# Patient Record
Sex: Male | Born: 1935
Health system: Southern US, Community
[De-identification: ages and names within clinical notes are randomized; demographics above are authoritative.]

## PROBLEM LIST (undated history)

## (undated) DIAGNOSIS — C61 Malignant neoplasm of prostate: Secondary | ICD-10-CM

## (undated) DIAGNOSIS — H5440 Blindness, one eye, unspecified eye: Secondary | ICD-10-CM

## (undated) DIAGNOSIS — Z992 Dependence on renal dialysis: Secondary | ICD-10-CM

## (undated) DIAGNOSIS — N186 End stage renal disease: Secondary | ICD-10-CM

## (undated) DIAGNOSIS — K429 Umbilical hernia without obstruction or gangrene: Secondary | ICD-10-CM

## (undated) DIAGNOSIS — I739 Peripheral vascular disease, unspecified: Secondary | ICD-10-CM

## (undated) DIAGNOSIS — M1990 Unspecified osteoarthritis, unspecified site: Secondary | ICD-10-CM

## (undated) DIAGNOSIS — I509 Heart failure, unspecified: Secondary | ICD-10-CM

## (undated) DIAGNOSIS — Z950 Presence of cardiac pacemaker: Secondary | ICD-10-CM

## (undated) DIAGNOSIS — E785 Hyperlipidemia, unspecified: Secondary | ICD-10-CM

## (undated) DIAGNOSIS — E119 Type 2 diabetes mellitus without complications: Secondary | ICD-10-CM

## (undated) DIAGNOSIS — B029 Zoster without complications: Secondary | ICD-10-CM

## (undated) DIAGNOSIS — E663 Overweight: Secondary | ICD-10-CM

## (undated) DIAGNOSIS — I10 Essential (primary) hypertension: Secondary | ICD-10-CM

## (undated) DIAGNOSIS — I7092 Chronic total occlusion of artery of the extremities: Secondary | ICD-10-CM

## (undated) DIAGNOSIS — I639 Cerebral infarction, unspecified: Secondary | ICD-10-CM

## (undated) DIAGNOSIS — D649 Anemia, unspecified: Secondary | ICD-10-CM

## (undated) HISTORY — DX: Chronic total occlusion of artery of the extremities: I70.92

## (undated) HISTORY — DX: Peripheral vascular disease, unspecified: I73.9

## (undated) HISTORY — PX: EYE SURGERY: SHX253

## (undated) HISTORY — DX: Cerebral infarction, unspecified: I63.9

## (undated) HISTORY — DX: Hyperlipidemia, unspecified: E78.5

## (undated) HISTORY — PX: CATARACT EXTRACTION: SUR2

## (undated) HISTORY — PX: INSERTION PROSTATE RADIATION SEED: SUR718

## (undated) HISTORY — PX: HERNIA REPAIR: SHX51

## (undated) HISTORY — DX: Unspecified osteoarthritis, unspecified site: M19.90

## (undated) HISTORY — DX: Heart failure, unspecified: I50.9

## (undated) HISTORY — DX: Essential (primary) hypertension: I10

## (undated) HISTORY — DX: Overweight: E66.3

---

## 1997-06-28 ENCOUNTER — Other Ambulatory Visit: Admission: RE | Admit: 1997-06-28 | Discharge: 1997-06-28 | Payer: Self-pay | Admitting: *Deleted

## 1997-09-28 ENCOUNTER — Emergency Department (HOSPITAL_COMMUNITY): Admission: EM | Admit: 1997-09-28 | Discharge: 1997-09-28 | Payer: Self-pay | Admitting: Emergency Medicine

## 1998-08-28 ENCOUNTER — Ambulatory Visit (HOSPITAL_COMMUNITY): Admission: RE | Admit: 1998-08-28 | Discharge: 1998-08-28 | Payer: Self-pay | Admitting: Gastroenterology

## 1999-07-09 ENCOUNTER — Encounter: Admission: RE | Admit: 1999-07-09 | Discharge: 1999-07-09 | Payer: Self-pay | Admitting: *Deleted

## 1999-07-09 ENCOUNTER — Encounter: Payer: Self-pay | Admitting: *Deleted

## 2000-08-19 ENCOUNTER — Encounter (INDEPENDENT_AMBULATORY_CARE_PROVIDER_SITE_OTHER): Payer: Self-pay | Admitting: Specialist

## 2000-08-19 ENCOUNTER — Other Ambulatory Visit: Admission: RE | Admit: 2000-08-19 | Discharge: 2000-08-19 | Payer: Self-pay | Admitting: Urology

## 2000-09-30 ENCOUNTER — Encounter (INDEPENDENT_AMBULATORY_CARE_PROVIDER_SITE_OTHER): Payer: Self-pay | Admitting: Specialist

## 2000-09-30 ENCOUNTER — Other Ambulatory Visit: Admission: RE | Admit: 2000-09-30 | Discharge: 2000-09-30 | Payer: Self-pay | Admitting: Urology

## 2000-11-12 ENCOUNTER — Ambulatory Visit: Admission: RE | Admit: 2000-11-12 | Discharge: 2001-02-10 | Payer: Self-pay | Admitting: Radiation Oncology

## 2001-02-02 ENCOUNTER — Encounter: Admission: RE | Admit: 2001-02-02 | Discharge: 2001-02-02 | Payer: Self-pay | Admitting: Radiation Oncology

## 2001-02-04 ENCOUNTER — Ambulatory Visit (HOSPITAL_BASED_OUTPATIENT_CLINIC_OR_DEPARTMENT_OTHER): Admission: RE | Admit: 2001-02-04 | Discharge: 2001-02-04 | Payer: Self-pay | Admitting: Urology

## 2001-02-25 ENCOUNTER — Ambulatory Visit: Admission: RE | Admit: 2001-02-25 | Discharge: 2001-05-26 | Payer: Self-pay | Admitting: Radiation Oncology

## 2001-08-01 ENCOUNTER — Encounter: Admission: RE | Admit: 2001-08-01 | Discharge: 2001-10-30 | Payer: Self-pay | Admitting: Internal Medicine

## 2007-01-08 ENCOUNTER — Emergency Department (HOSPITAL_COMMUNITY): Admission: EM | Admit: 2007-01-08 | Discharge: 2007-01-08 | Payer: Self-pay | Admitting: Emergency Medicine

## 2010-05-23 ENCOUNTER — Ambulatory Visit (INDEPENDENT_AMBULATORY_CARE_PROVIDER_SITE_OTHER): Payer: Medicare Other | Admitting: Vascular Surgery

## 2010-05-23 ENCOUNTER — Encounter (INDEPENDENT_AMBULATORY_CARE_PROVIDER_SITE_OTHER): Payer: Medicare Other

## 2010-05-23 DIAGNOSIS — N186 End stage renal disease: Secondary | ICD-10-CM

## 2010-05-23 DIAGNOSIS — Z01810 Encounter for preprocedural cardiovascular examination: Secondary | ICD-10-CM

## 2010-05-23 DIAGNOSIS — N189 Chronic kidney disease, unspecified: Secondary | ICD-10-CM

## 2010-05-23 DIAGNOSIS — N19 Unspecified kidney failure: Secondary | ICD-10-CM

## 2010-05-26 NOTE — Consult Note (Signed)
NEW PATIENT CONSULTATION  Ralls, Naziah DOB:  [birth year]                                       05/23/2010 [id number]  REASON FOR CONSULTATION:  Placement of access.  HISTORY OF PRESENT ILLNESS:  This is a 77 year old gentleman has chronic kidney disease stage IV.  His nephrologist feels he is approaching end- stage renal disease, so he wants evaluated for a new access.  This gentleman's dominant arm is a right arm.  He denies any previous accesses and any previous central line placement.  Denies any sensory or motor deficits.  PAST MEDICAL HISTORY:  Diabetes, hypertension, hyperlipidemia, prostate cancer, stroke, gout, overweight, chronic kidney disease stage III-IV.  PAST SURGICAL HISTORY:  Past surgical history includes some type of left eye surgery.  He has had some prostate seeding placement.  SOCIAL HISTORY:  He had denies any tobacco, alcohol or illicit drug use.  FAMILY HISTORY:  His father had some type of throat cancer and died at 82.  Mother had breast cancer and a MI and died at 83.  MEDICATIONS:  Included indomethacin, glimepiride, Lipitor, metoprolol, amlodipine, Cozaar, colchicine, aspirin, Lasix, calcitriol, allopurinol, Tradjenta and also another drug Mapap.  ALLERGIES:  No known drug allergies.  REVIEW OF SYSTEMS:  He had pain in legs with walking, pain in the feet when lying flat, stroke, arthritis, joint pain, kidney disease, frequent urination.  PHYSICAL EXAMINATION:  He had a blood pressure of 150/84, respirations of 68, respirations were 12. General examination:  He is alert, oriented x3, obese. Head exam:  Normocephalic, atraumatic. ENT exam:  Hearing grossly intact.  Nares without any erythema or drainage.  Oropharynx without erythema or exudate. On eye exam pupils were equal, round, reactive to light.  Extraocular movements were intact. On neck exam supple neck with no nuchal rigidity, no palpable lymphadenopathy. On  pulmonary exam symmetric, good movement.  No rales, rhonchi or wheezing. Cardiac exam:  Regular rate and rhythm.  Normal S1, S2. Vascular exam:  He had palpable upper extremity pulses.  Carotids were palpable with no bruit.  Aorta was not palpable.  The bilateral femorals were palpable but his popliteal and pedal pulses were not palpable. On GI exam soft abdomen, nontender, nondistended, no guarding or rebound, no hepatosplenomegaly.  No obvious masses. Musculoskeletal exam:  He had 5/5 strength throughout.  He had bilateral edema in the lower extremities without any ulceration or ischemic changes. On neuro exam cranial nerves II-XII were intact.  Motor was as listed above.  Sensation grossly intact in all extremities. Psych exam:  Judgment was intact.  Mood and affect were appropriate for his clinical situation. Skin exam:  Extremities were as listed above.  No obvious rashes otherwise noted. On lymphatic exam, no cervical, axillary, or inguinal lymphadenopathy.  Noninvasive vascular imaging:  He had vein mapping.  This demonstrated bilateral cephalic and basilic veins that were all compatible with fistulas.  MEDICAL DECISION MAKING:  This is a 77 year old right-hand dominant gentleman who has chronic kidney disease stage IV.  Based on his vein mapping, he has good options in both arms.  Proceed first with attempt at a radiocephalic versus brachiocephalic in the left arm.  I would reserve the left basilic vein transposition if these two should fail. He is aware that the risks of this procedure include bleeding, infection, possible steal syndrome, possible nerve damage, possible ischemic monomelar  neuropathy, possible failure to mature and possible need for additional procedures.  We discussed the nature of access surgery over a 15 minute period and he is aware of all these risks. Tentatively we are scheduling him for Monday April 2.    Conrad [place], MD Electronically  Signed  BLC/MEDQ  D:  05/23/2010  T:  05/26/2010  Job:  [id number]

## 2010-05-27 ENCOUNTER — Other Ambulatory Visit: Payer: Self-pay | Admitting: Vascular Surgery

## 2010-05-27 ENCOUNTER — Encounter (HOSPITAL_COMMUNITY)
Admission: RE | Admit: 2010-05-27 | Discharge: 2010-05-27 | Disposition: A | Discharge status: Home / Self Care | Payer: Medicare Other | Source: Ambulatory Visit | Attending: Vascular Surgery | Admitting: Vascular Surgery

## 2010-05-27 DIAGNOSIS — N186 End stage renal disease: Secondary | ICD-10-CM

## 2010-05-27 DIAGNOSIS — Z01818 Encounter for other preprocedural examination: Secondary | ICD-10-CM | POA: Insufficient documentation

## 2010-05-27 DIAGNOSIS — Z01812 Encounter for preprocedural laboratory examination: Secondary | ICD-10-CM | POA: Insufficient documentation

## 2010-05-27 LAB — CBC
HCT: 36.6 % — ABNORMAL LOW (ref 39.0–52.0)
Hemoglobin: 12.1 g/dL — ABNORMAL LOW (ref 13.0–17.0)
MCH: 29 pg (ref 26.0–34.0)
MCHC: 33.1 g/dL (ref 30.0–36.0)
MCV: 87.8 fL (ref 78.0–100.0)
Platelets: 248 10*3/uL (ref 150–400)
RBC: 4.17 MIL/uL — ABNORMAL LOW (ref 4.22–5.81)
RDW: 14 % (ref 11.5–15.5)
WBC: 8.2 10*3/uL (ref 4.0–10.5)

## 2010-05-27 LAB — BASIC METABOLIC PANEL
BUN: 64 mg/dL — ABNORMAL HIGH (ref 6–23)
CO2: 27 mEq/L (ref 19–32)
Calcium: 9.6 mg/dL (ref 8.4–10.5)
Chloride: 106 mEq/L (ref 96–112)
Creatinine, Ser: 4.23 mg/dL — ABNORMAL HIGH (ref 0.4–1.5)
GFR calc Af Amer: 17 mL/min — ABNORMAL LOW (ref >60)
GFR calc non Af Amer: 14 mL/min — ABNORMAL LOW (ref >60)
Glucose, Bld: 131 mg/dL — ABNORMAL HIGH (ref 70–99)
Potassium: 5.5 mEq/L — ABNORMAL HIGH (ref 3.5–5.1)
Sodium: 139 mEq/L (ref 135–145)

## 2010-05-27 NOTE — Procedures (Unsigned)
CEPHALIC VEIN MAPPING  INDICATION:  Chronic kidney disease.  HISTORY: Diabetes, hypertension and hyperlipidemia.  EXAM: The right cephalic vein is compressible with diameter measurements ranging from 0.32 to 0.57 cm.  The right basilic vein is compressible with diameter measurements ranging from 0.33 to 0.46 cm.  The left cephalic vein is compressible with diameter measurements ranging from 0.36 to 0.65 cm.  The left basilic vein is compressible with diameter measurements ranging from 0.26 to 0.61 cm.  See attached worksheet for all measurements.  IMPRESSION: 1. Patent bilateral cephalic and basilic veins with diameter     measurements, as described above. 2. Incidental finding noted of dual brachial artery systems in the     bilateral upper extremities. 3. The basilic veins bifurcate at the level of the distal upper     arm/antecubital fossa. 4. The left cephalic vein at the wrist is on the posterior aspect of     the arm.  ___________________________________________ Conrad [place], MD  SH/MEDQ  D:  05/23/2010  T:  05/23/2010  Job:  [id number]

## 2010-06-02 ENCOUNTER — Ambulatory Visit (HOSPITAL_COMMUNITY)
Admission: RE | Admit: 2010-06-02 | Discharge: 2010-06-02 | Disposition: A | Discharge status: Home / Self Care | Payer: Medicare Other | Source: Ambulatory Visit | Attending: Vascular Surgery | Admitting: Vascular Surgery

## 2010-06-02 DIAGNOSIS — M109 Gout, unspecified: Secondary | ICD-10-CM | POA: Insufficient documentation

## 2010-06-02 DIAGNOSIS — N185 Chronic kidney disease, stage 5: Secondary | ICD-10-CM | POA: Insufficient documentation

## 2010-06-02 DIAGNOSIS — E119 Type 2 diabetes mellitus without complications: Secondary | ICD-10-CM | POA: Insufficient documentation

## 2010-06-02 DIAGNOSIS — I120 Hypertensive chronic kidney disease with stage 5 chronic kidney disease or end stage renal disease: Secondary | ICD-10-CM

## 2010-06-02 DIAGNOSIS — Z8546 Personal history of malignant neoplasm of prostate: Secondary | ICD-10-CM | POA: Insufficient documentation

## 2010-06-02 DIAGNOSIS — N186 End stage renal disease: Secondary | ICD-10-CM

## 2010-06-02 DIAGNOSIS — Z01818 Encounter for other preprocedural examination: Secondary | ICD-10-CM | POA: Insufficient documentation

## 2010-06-02 HISTORY — PX: AV FISTULA PLACEMENT, RADIOCEPHALIC: SHX1208

## 2010-06-02 LAB — GLUCOSE, CAPILLARY: Glucose-Capillary: 87 mg/dL (ref 70–99)

## 2010-06-04 LAB — POCT I-STAT 4, (NA,K, GLUC, HGB,HCT)
Glucose, Bld: 99 mg/dL (ref 70–99)
HCT: 37 % — ABNORMAL LOW (ref 39.0–52.0)
Hemoglobin: 12.6 g/dL — ABNORMAL LOW (ref 13.0–17.0)
Potassium: 4.6 mEq/L (ref 3.5–5.1)

## 2010-06-06 NOTE — Op Note (Signed)
NAMEOLTON, PREJEAN               ACCOUNT NO.:  [id number]  MEDICAL RECORD NO.:  [id number]           PATIENT TYPE:  O  LOCATION:  [facility]                         FACILITY:  [facility]  PHYSICIAN:  Conrad Rawlins, MD       DATE OF BIRTH:  02-28-1934  DATE OF PROCEDURE: DATE OF DISCHARGE:  06/02/2010                              OPERATIVE REPORT   PROCEDURE:  A left radiocephalic arteriovenous fistula.  PREOPERATIVE DIAGNOSIS:  Chronic kidney disease, stage V.  POSTOPERATIVE DIAGNOSIS:  Chronic kidney disease, stage V.  SURGEON:  Aaron Edelman L. Bridgett Larsson, MD  ANESTHESIA:  General.  FINDINGS:  In this case is a weak thrill and a dopplerable left radial at the end of the case and an atherosclerotic left radial artery.  SPECIMENS:  None.  ESTIMATED BLOOD LOSS:  Minimal.  INDICATIONS:  This is a 77 year old gentleman who is now in chronic kidney disease stage V.  Based on his vein mapping, he was a good candidate for either a left radiocephalic or brachiocephalic arteriovenous fistula.  He is aware of the risks of the procedure include bleeding, infection, possible steal, possible ischemic monomeric neuropathy, possible nerve damage, possible need for additional procedures, and possible failure to mature.  He was aware of these risks and agreed to proceed forward.  DESCRIPTION OF THE OPERATION:  After full informed written consent was obtained from the patient, he was brought back to the operating room and placed supine upon the operating table.  Prior to inducing anesthesia, he had received IV antibiotics.  After obtaining adequate anesthesia, he was then prepped and draped in standard fashion for left arm access procedure.  I turned my attention first of his wrist.  Using a SonoSite, I successfully identified the cephalic vein branch adjacent to his radial artery.  I made a transverse incision over both the vein and artery.  Using blunt dissection and electrocautery, I develop a plane down  to the vein and artery.  The vein was noted to be externally at least 3 mm in diameter.  Also, the artery was noted to be about 3 mm in diameter.  I dissected out the vein a little distally and proximally and clamped the vein distally, and transected the vein, and then controlled the distal vein with two titanium clips.  I then interrogated the proximal vein after dissecting out a little bit more.  There was actually good venous backbleeding.  I was able to pass easily a 3-mm dilator.  I passed also a 3.5-mm diameter, but it caught at one location and, however, was able to be dilated past this one area of stenosis.  I felt that there was still adequate for attempt at a fistula and then dissected out the radial artery proximally and distally to obtain some additional distance and placed vessel loops around it.  It was placed under tension proximally and distally to these vessel loops.  I made an arteriotomy in it, and extended with Potts scissor for about a 3.5-mm arteriotomy.  I distended the vein again, injected heparinized saline, and then spatulated the vein for this 3.5-mm arteriotomy.  The vein  was sewn to the artery in an end-to-side configuration using a running stitch of 7-0 Prolene.  Prior to completing this anastomosis, I allowed the artery to back bleed from both ends.  There was good back bleeding without clot.  The vein also backbled.  I completed the anastomosis in usual fashion.  There was a little bit of raw surface bleeding in the surgical wound, so I put thrombin and Gelfoam in this wound.  Immediately, there was a good pulse in the outflow vein and a weak thrill.  I interrogated the artery and vein with a continuous Doppler.  It demonstrated a dopplerable left radial artery which did not augment significantly with compression.  There was a strong venous outflow signal and then a multiphasic signal on the proximal end.  I then irrigated out this wound and took out all the  thrombin and Gelfoam.  There was no more active bleeding.  The subcutaneous tissue was reapproximated with running stitch of 3-0 Vicryl.  The skin was then closed with running subcuticular 4-0 Monocryl and reinforced with Dermabond.  The patient was allowed to awaken without difficulties with plan to discharge home.  COMPLICATIONS:  None.  CONDITION:  Stable.     Conrad [place], MD     BLC/MEDQ  D:  06/02/2010  T:  06/03/2010  Job:  [id number]  Electronically Signed by Adele Barthel MD on 06/06/2010 05:25:09 PM

## 2010-06-12 ENCOUNTER — Encounter: Payer: Self-pay | Admitting: Vascular Surgery

## 2010-07-04 ENCOUNTER — Ambulatory Visit (INDEPENDENT_AMBULATORY_CARE_PROVIDER_SITE_OTHER): Payer: Medicare Other | Admitting: Vascular Surgery

## 2010-07-04 DIAGNOSIS — N186 End stage renal disease: Secondary | ICD-10-CM

## 2010-07-07 ENCOUNTER — Encounter: Payer: Self-pay | Admitting: Vascular Surgery

## 2010-07-07 NOTE — Assessment & Plan Note (Signed)
OFFICE VISIT  Sean Best, Sean Best DOB:  20-Jul-1933                                       07/04/2010 [id number]  Postop followup.  HISTORY OF PRESENT ILLNESS:  A 77 year old gentleman status post a left radiocephalic arteriovenous fistula placed on June 02, 2010 presents for a followup.  He has had no steal symptomatology and is able to complete his activities of daily living.  No drainage from his incision, which is healed at this point.  PHYSICAL EXAMINATION:  Blood pressure is 152/87, respirations were 24, heart rate of 69.  On focused examination, the left wrist demonstrates a well-healed incision and a strongly palpable thrill at this level, which at about mid arm, drops off.  I can also see dilation at the skin level of this radiocephalic arteriovenous fistula.  MEDICAL DECISION MAKING:  A 77 year old gentleman with a left radiocephalic arteriovenous fistula.  I suspect there are some competing side branches that are siphoning off flow.  I want to get him duplexed in his left arm and then evaluate for possible large competing side branches.  The duplex will also give me an accurate evaluation of depth of the fistula along with size measurements on this radiocephalic arteriovenous fistula.  I should get an idea if this is ready for utilization versus the possible need for superficialization versus possible need for ligation of competing side branches.    Conrad [place], MD Electronically Signed  BLC/MEDQ  D:  07/04/2010  T:  07/07/2010  Job:  [id number]  cc:   Dr. Hassell Done

## 2010-07-18 NOTE — Op Note (Signed)
[facility]  Patient:    Sean, Best Visit Number: [id number] MRN: [id number]          Service Type: NES Location: [place] Attending Physician:  Philipp Deputy Dictated by:   Thana Farr Karsten Ro, M.D. Proc. Date: 02/04/01 Admit Date:  02/04/2001 Discharge Date: 02/04/2001   CC:         Truddie Crumble, M.D.   Operative Report  PREOPERATIVE DIAGNOSIS: Adenocarcinoma of the prostate.  POSTOPERATIVE DIAGNOSIS: Adenocarcinoma of the prostate.  OPERATION: I-125 seed implantation.  SURGEON: Mark C. Karsten Ro, M.D.  RADIATION ONCOLOGIST: Truddie Crumble, M.D.  DRAINS: A 16 French Foley catheter.  ESTIMATED BLOOD LOSS: Less than 5 cc.  NUMBER OF SEEDS: 100.  NUMBER OF NEEDLES: 25.  COMPLICATIONS: None.  INDICATIONS: The patient is a 77 year old black male with biopsy-proven adenocarcinoma of the prostate. I have discussed the treatment options with him and this is outlined in my office notes, which have also been placed on the hospital chart. He has elected to proceed with radioactive seed implantation and understands the risks, complications, alternatives, limitations.  DESCRIPTION OF PROCEDURE: After informed consent, the patient was brought to the major OR, placed on the table and administered general endotracheal anesthesia and then moved to the dorsal lithotomy position, modified to place the peritoneum perpendicular with the floor. A rectal tube was inserted as well as Foley catheter with dilute contrast in the balloon. Real time fluoroscopy was then used to position the patient and transrectal ultrasound was placed within the rectum and secured to the table. Real time ultrasound was then used to place the patient in identical position to that used for simulation and stabilizing needles were then inserted. I then placed the seeds under direct real time ultrasound and fluoroscopic control without complication. One strand of three  seeds was dislodged from the needle and it was located in the deep tissues, not within the prostate and should cause no problem.  I then removed the ultrasound probe stabilizing needles and Foley catheter as well as rectal tube and performed flexible cystoscopy.  The urethra was noted to be entirely normal down to the sphincter which appears intact and prostatic urethra reveals some bilobar hypertrophy and slight elongation, but no lesions nor were there any evidence of Vicryl strands or seeds seen within the prostatic urethra. The bladder itself was fully inspected and noted to be free of any tumor, stones, or inflammatory lesions. There was 1+ trabeculation in and the ureteral orifices were of normal configuration and position. No active bleeding was noted. The bladder neck and prostate base region were then visualized with retroflexion of the scope.  No seeds were noted protruding from the prostate gland into the bladder and there were no seeds within the bladder itself.  The patient tolerated the procedure well and there were no inoperative complications, and he will therefore be discharged home with a Foley catheter indwelling with instructions on its removal tomorrow.  He will follow up in my office in 3 weeks. He will be given a prescription for Cipro 500 mg b.i.d. which he will take for 5 days and a prescription for Vicodin ES. Dictated by:   Thana Farr Karsten Ro, M.D. Attending Physician:  Philipp Deputy DD:  02/04/01 TD:  02/05/01 Job: [id number]

## 2010-08-01 ENCOUNTER — Encounter: Payer: Self-pay | Admitting: Vascular Surgery

## 2010-08-01 ENCOUNTER — Encounter (INDEPENDENT_AMBULATORY_CARE_PROVIDER_SITE_OTHER): Payer: Medicare Other

## 2010-08-01 ENCOUNTER — Ambulatory Visit (INDEPENDENT_AMBULATORY_CARE_PROVIDER_SITE_OTHER): Payer: Medicare Other | Admitting: Vascular Surgery

## 2010-08-01 VITALS — BP 164/89 | HR 66 | Temp 98.1°F

## 2010-08-01 DIAGNOSIS — N184 Chronic kidney disease, stage 4 (severe): Secondary | ICD-10-CM

## 2010-08-01 DIAGNOSIS — N185 Chronic kidney disease, stage 5: Secondary | ICD-10-CM | POA: Insufficient documentation

## 2010-08-01 DIAGNOSIS — T82898A Other specified complication of vascular prosthetic devices, implants and grafts, initial encounter: Secondary | ICD-10-CM

## 2010-08-01 NOTE — Progress Notes (Signed)
[facility] [place]  Postoperative Visit  History of Present Illness  Sean Best is a 77 y.o. year old male who presents for postoperative follow-up for: L RC AVF (Date: 06/02/10).  The patient's wound are healed.  The patient notes no steal symptoms.  The patient is able to complete his activities of daily living.    Physical Examination  Filed Vitals:   08/01/10 1051  BP: 164/89  Pulse: 66  Temp: 98.1 F (36.7 C)   LU extremity: Incision is healed, skin feels warm, hand grip is 5/5, sensation in digits is intact, easily palpable thrill, strong bruit in forearm  Non-invasive Vascular Imaging L access duplex: L forearm cephalic demonstrates multiple sidebranches and cephalic vein ranges [id number] cm in diameter   Medical Decision Making  Sean Best is a 77 y.o. year old male who presents s/p L RC AVF (2 months).  The Cimino AVF is also matured (0.57-0.54 cm).  As the patient is not on hemodialysis yet I think we can allow it another month to mature.  I will have patient follow in 4 weeks and if his RC AVF still in not > 6 mm throughout at that point I will proceed with ligation of competing side branches.  Thank you for allowing [place] to participate in this patient's care.  Adele Barthel, MD [facility] Pager: [phone]

## 2010-08-01 NOTE — Progress Notes (Signed)
Post op Right Radial-cephalic AVF [id number]

## 2010-08-13 NOTE — Procedures (Unsigned)
VASCULAR LAB EXAM  INDICATION:  End-stage renal disease, status post left radiocephalic AV fistula, ?side branches.   HISTORY:  EXAM:  Left AV fistula duplex.  IMPRESSION: 1. Patent radiocephalic to cephalic arteriovenous fistula with     velocities of greater than 450 cm/s noted in the distal radial     artery at the anastomosis and in the distal forearm level outflow     vein. 2. The left radial artery demonstrates a retrograde flow. 3. Diameter, depth, velocity, and patent outflow vein branch     measurements are noted on the attached worksheet.   ___________________________________________ Conrad [place], MD  CH/MEDQ  D:  08/01/2010  T:  08/01/2010  Job:  [id number]

## 2010-08-27 NOTE — Progress Notes (Signed)
[facility] [place]  Established Dialysis Access  History of Present Illness  Sean Best is a 77 y.o. male who presents for re-evaluation of his L RC AVF.  The patient presents with follow up access duplex of L RC AVF.  Past Medical History, Past Surgical History, Social History, Family History, Medications, Allergies, and Review of Systems are unchanged from previous visit 08/01/10.  Physical Examination  Filed Vitals:   08/29/10 1629  BP: 146/83  Pulse: 70  Resp: 18    General: A&O x 3, WDWN,   Pulmonary: symmetric expansion, good air movement, no rales, rhonchi or wheezing  Cardiac: RRR, Nl S1, S2, no Murmurs, rubs or gallops  Musculoskeletal: BUE: M/S 5/5 throughout, Extremities without  ischemic changes   Neurologic: Pain and light touch intact in BUE extremities, Motor exam as listed above  Non-Invasive Vascular Imaging  L access duplex  (Date: 08/01/10):   2.9-5.7 mm diameter  5 Multiple side branches discovered  High velocities in radial artery: 460-583 c/s c/w atherosclerotic disease  Medical Decision Making  Sean Best is a 77 y.o. male who presents with L RC AVF with marginal maturation.  Based on vein mapping and examination, this patient needs side branch ligation.  The patient would like to wait another month to see if the fistula is able to mature.  I think this may be an acceptable alternative as it already is almost at adequate size.  Fundamentally this radial artery is of poor quality due to atherosclerosis so I would not be surprised if this RC AVF clots.  Subsequently, ancillary procedures are not necessarily of value in this pt.  However, this pressured cephalic vein may facilitate a successful L BC AVF by distending the upper arm cephalic vein.  Adele Barthel, MD [facility] [place] Office: [phone] Pager: [phone]

## 2010-08-29 ENCOUNTER — Ambulatory Visit (INDEPENDENT_AMBULATORY_CARE_PROVIDER_SITE_OTHER): Payer: Medicare Other | Admitting: Vascular Surgery

## 2010-08-29 ENCOUNTER — Encounter: Payer: Self-pay | Admitting: Vascular Surgery

## 2010-08-29 VITALS — BP 146/83 | HR 70 | Resp 18

## 2010-08-29 DIAGNOSIS — N184 Chronic kidney disease, stage 4 (severe): Secondary | ICD-10-CM

## 2010-09-26 ENCOUNTER — Ambulatory Visit: Payer: Medicare Other

## 2010-09-26 ENCOUNTER — Ambulatory Visit: Payer: Medicare Other | Admitting: Vascular Surgery

## 2010-10-29 NOTE — Progress Notes (Signed)
[facility] [place]  Established Dialysis Access  History of Present Illness  Baron Steinback is a 77 y.o. male who presents for re-evaluation for permanent access.  The patient is right hand dominant.  Previous access procedures have been completed in the left arm.  The patient's complication from previous access procedures include: thrombosis.  Past Medical History, Past Surgical History, Social History, Family History, Medications, Allergies, and Review of Systems are unchanged from previous visit 08/01/10.  Physical Examination  Filed Vitals:   10/31/10 1536  BP: 176/92  Pulse: 70  Resp: 20    General: A&O x 3, WDWN  Vascular:   palpable left radial,  palpable thrill,  thrill ausc, L RC AVF > 6 mm throughout  Musculoskeletal: M/S 5/5 throughout , Extremities without  ischemic changes   Neurologic:  Pain and light touch intact in extremities , Motor exam as listed above  Medical Decision Making  Jadriel Ebron is a 77 y.o. male who presents with successfully matured L RC AVF   The patient's RC AVF can be used as needed  If flow rates are inadequate, side branch ligation may be attempted to improve the possible flow rate in the conduit  Thank you for letting us participate in this patient's care  Adele Barthel, MD [facility] [place] Office: [phone] Pager: [phone]

## 2010-10-30 ENCOUNTER — Encounter: Payer: Self-pay | Admitting: Vascular Surgery

## 2010-10-31 ENCOUNTER — Ambulatory Visit (INDEPENDENT_AMBULATORY_CARE_PROVIDER_SITE_OTHER): Payer: Medicare Other | Admitting: Vascular Surgery

## 2010-10-31 ENCOUNTER — Encounter: Payer: Self-pay | Admitting: Vascular Surgery

## 2010-10-31 VITALS — BP 176/92 | HR 70 | Resp 20 | Ht 65.0 in | Wt 244.0 lb

## 2010-10-31 DIAGNOSIS — N186 End stage renal disease: Secondary | ICD-10-CM

## 2010-12-09 LAB — DIFFERENTIAL
Lymphocytes Relative: 15
Lymphs Abs: 1.7
Neutro Abs: 8.5 — ABNORMAL HIGH
Neutrophils Relative %: 73

## 2010-12-09 LAB — I-STAT 8, (EC8 V) (CONVERTED LAB)
Acid-Base Excess: 3 — ABNORMAL HIGH
Chloride: 103
HCT: 50
Operator id: 126491
Potassium: 4.5
Sodium: 136
TCO2: 30

## 2010-12-09 LAB — CBC
HCT: 45.3
Platelets: 314
WBC: 11.6 — ABNORMAL HIGH

## 2010-12-09 LAB — URIC ACID: Uric Acid, Serum: 8.5 — ABNORMAL HIGH

## 2010-12-09 LAB — POCT I-STAT CREATININE: Operator id: 126491

## 2011-02-20 ENCOUNTER — Ambulatory Visit: Payer: Medicare Other | Admitting: Vascular Surgery

## 2011-03-19 ENCOUNTER — Encounter: Payer: Self-pay | Admitting: Vascular Surgery

## 2011-03-20 ENCOUNTER — Encounter: Payer: Self-pay | Admitting: Vascular Surgery

## 2011-03-20 ENCOUNTER — Ambulatory Visit (INDEPENDENT_AMBULATORY_CARE_PROVIDER_SITE_OTHER): Payer: Medicare Other | Admitting: Vascular Surgery

## 2011-03-20 VITALS — BP 134/79 | HR 66 | Resp 20 | Ht 66.0 in | Wt 240.0 lb

## 2011-03-20 DIAGNOSIS — N184 Chronic kidney disease, stage 4 (severe): Secondary | ICD-10-CM

## 2011-03-20 DIAGNOSIS — Z992 Dependence on renal dialysis: Secondary | ICD-10-CM | POA: Insufficient documentation

## 2011-03-20 NOTE — Progress Notes (Signed)
[facility] [place]  Established Dialysis Access  History of Present Illness  Sean Best is a 78 y.o. male who presents for re-evaluation of his L RC AVF (06/02/10).  The patient is right hand dominant.  Previous access procedures have been completed in the left arm.  The patient's complication from previous access procedures include: possible non-maturation.  The patient has never had a previous PPM placed.  The patient remains off hemodialysis.  Past Medical History, Past Surgical History, Social History, Family History, Medications, Allergies, and Review of Systems are unchanged from previous visit on 08/01/10.  Physical Examination  Filed Vitals:   03/20/11 1451  BP: 134/79  Pulse: 66  Resp: 20  Height: 5\' 6"  (1.676 m)  Weight: 240 lb (108.863 kg)   Body mass index is 38.74 kg/(m^2).  General: A&O x 3, WDWN, stocky  Pulmonary: Sym exp, good air movt, CTAB, no rales, rhonchi, & wheezing  Cardiac: RRR, Nl S1, S2, no Murmurs, rubs or gallops  Gastrointestinal: soft, NTND, -G/R, - HSM, - masses, - CVAT B  Musculoskeletal: M/S 5/5 throughout , Extremities without  ischemic changes , palpable thrill and ausc thrill in left forearm, visible distension in cephalic vein, difficult to feel thrill in proximal forearm  Neurologic: CN 2-12 intact , Pain and light touch intact in extremities , Motor exam as listed above  Medical Decision Making  Sean Best is a 78 y.o. male who presents with CKD IV  I would start with a repeat L arm access duplex to check if the fistula has matured further.  If not, I would consider L arm fistulogram to help determine the next necessary intervention.  We will obtain the studies in next 2-4 weeks and patient will follow up after that is available.Adele Barthel, MD [facility] [place] Office: [phone] Pager: [phone]  03/20/2011, 6:35 PM

## 2011-04-16 ENCOUNTER — Encounter: Payer: Self-pay | Admitting: Vascular Surgery

## 2011-04-17 ENCOUNTER — Ambulatory Visit (INDEPENDENT_AMBULATORY_CARE_PROVIDER_SITE_OTHER): Payer: Medicare Other | Admitting: Vascular Surgery

## 2011-04-17 ENCOUNTER — Encounter: Payer: Self-pay | Admitting: Vascular Surgery

## 2011-04-17 ENCOUNTER — Encounter (INDEPENDENT_AMBULATORY_CARE_PROVIDER_SITE_OTHER): Payer: Medicare Other | Admitting: *Deleted

## 2011-04-17 VITALS — BP 155/84 | HR 73 | Resp 16 | Ht 65.0 in | Wt 245.0 lb

## 2011-04-17 DIAGNOSIS — N186 End stage renal disease: Secondary | ICD-10-CM

## 2011-04-17 DIAGNOSIS — N184 Chronic kidney disease, stage 4 (severe): Secondary | ICD-10-CM

## 2011-04-17 NOTE — Progress Notes (Signed)
[facility] [place]  Established Dialysis Access  History of Present Illness  Sean Best is a 78 y.o. (1933/10/10) male who presents for re-evaluation of his permanent access.  He remains off HD with some improvement in renal function by report.  The patient denies any problems from his L RC AVF.  Past Medical History, Past Surgical History, Social History, Family History, Medications, Allergies, and Review of Systems are unchanged from previous visit on 03/20/11.  Physical Examination  Filed Vitals:   04/17/11 1602  BP: 155/84  Pulse: 73  Resp: 16  Height: 5\' 5"  (1.651 m)  Weight: 245 lb (111.131 kg)  SpO2: 98%   Body mass index is 40.77 kg/(m^2).  General: A&O x 3, WDWN  Pulmonary: Sym exp, good air movt, CTAB, no rales, rhonchi, & wheezing  Cardiac: RRR, Nl S1, S2, no Murmurs, rubs or gallops  Gastrointestinal: soft, NTND, -G/R, - HSM, - masses, - CVAT B  Musculoskeletal: M/S 5/5 throughout , Extremities without  ischemic changes , muscular L forearm with palpable thrill and bruit  Neurologic: Pain and light touch intact in extremities , Motor exam as listed above  Non-Invasive Vascular Imaging  L access duplex  (Date: 04/17/11):   Widely patent L RC AVF with excellent flow rates  L RC AVF: 5.2-8.2 mm in diameter, w/ depth < 6 mm   Medical Decision Making  Sean Best is a 78 y.o. male who presents with CKD not requiring hemodialysis.   The pt's RC AVF is more visible than when I last saw him.  In my opinion, it is big enough for use already.    The issue is he has a relatively muscular forearm which makes it a bit difficult to palpate the vein proximally in the forearm.  Ironically, it is already 6-8 mm at the segment most difficult to palpate.    Also the distal segment routes onto his volar surface of his forearm, not expectedly.  There are several side branches that could be considered for ligation if difficulty with cannulation were  to occur.  Also if difficulty with cannulation exists in the forearm, the flow rates may be adequate in the upper arm cephalic as he seems to preferentially drain through that system, as it is progressively enlarging.    This patient will follow up as needed.  Adele Barthel, MD [facility] [place] Office: [phone] Pager: [phone]  04/17/2011, 5:27 PM

## 2011-04-28 NOTE — Procedures (Unsigned)
VASCULAR LAB EXAM  INDICATION:  Chronic kidney disease stage IV.  HISTORY: Diabetes: Cardiac: Hypertension:  EXAM:  Left AV fistula duplex.  IMPRESSION: 1. Patent left radial to cephalic arteriovenous fistula noted with     maximum velocity of 250 cm/s noted at the anastomosis level with no     internal narrowing visualized. 2. The left radial artery demonstrates retrograde flow which is     unchanged from the previous exam on 08/01/2010. 3. Depth, diameter, velocity and patent outflow vein branch     measurements are noted on the attached worksheet. 4. Doppler velocities of the inflow artery, anastomosis and outflow     vein appear less than previously recorded during the exam on     08/01/2010.  ___________________________________________ Conrad Rosemont, MD  CH/MEDQ  D:  04/17/2011  T:  04/17/2011  Job:  [id number]

## 2012-02-09 ENCOUNTER — Other Ambulatory Visit: Payer: Self-pay | Admitting: *Deleted

## 2012-02-09 DIAGNOSIS — Z01810 Encounter for preprocedural cardiovascular examination: Secondary | ICD-10-CM

## 2012-02-09 DIAGNOSIS — N186 End stage renal disease: Secondary | ICD-10-CM

## 2012-03-11 ENCOUNTER — Ambulatory Visit: Payer: Medicare Other | Admitting: Vascular Surgery

## 2012-06-03 ENCOUNTER — Encounter (HOSPITAL_COMMUNITY): Payer: Medicare Other

## 2012-06-16 ENCOUNTER — Other Ambulatory Visit (HOSPITAL_COMMUNITY): Payer: Self-pay | Admitting: *Deleted

## 2012-06-17 ENCOUNTER — Encounter (HOSPITAL_COMMUNITY): Payer: Medicare Other

## 2012-10-17 ENCOUNTER — Encounter: Payer: Self-pay | Admitting: Nephrology

## 2012-11-15 ENCOUNTER — Encounter: Payer: Self-pay | Admitting: Nephrology

## 2013-02-08 ENCOUNTER — Other Ambulatory Visit (HOSPITAL_COMMUNITY): Payer: Self-pay | Admitting: *Deleted

## 2013-02-09 ENCOUNTER — Encounter (HOSPITAL_COMMUNITY)
Admission: RE | Admit: 2013-02-09 | Discharge: 2013-02-09 | Disposition: A | Discharge status: Home / Self Care | Payer: Medicare Other | Source: Ambulatory Visit | Attending: Nephrology | Admitting: Nephrology

## 2013-02-09 DIAGNOSIS — N185 Chronic kidney disease, stage 5: Secondary | ICD-10-CM | POA: Insufficient documentation

## 2013-02-09 DIAGNOSIS — D638 Anemia in other chronic diseases classified elsewhere: Secondary | ICD-10-CM | POA: Insufficient documentation

## 2013-02-09 LAB — POCT HEMOGLOBIN-HEMACUE: Hemoglobin: 9.1 g/dL — ABNORMAL LOW (ref 13.0–17.0)

## 2013-02-09 MED ORDER — CLONIDINE HCL 0.1 MG PO TABS
ORAL_TABLET | ORAL | Status: AC
  Filled 2013-02-09: qty 1

## 2013-02-09 MED ORDER — CLONIDINE HCL 0.1 MG PO TABS
0.1000 mg | ORAL_TABLET | Freq: Once | ORAL | Status: AC
  Administered 2013-02-09: 0.1 mg via ORAL

## 2013-02-09 MED ORDER — SODIUM CHLORIDE 0.9 % IV SOLN
1020.0000 mg | Freq: Once | INTRAVENOUS | Status: AC
  Administered 2013-02-09: 1020 mg via INTRAVENOUS
  Filled 2013-02-09: qty 34

## 2013-02-09 MED ORDER — EPOETIN ALFA 10000 UNIT/ML IJ SOLN: 10000.0000 [IU] | INTRAMUSCULAR | Status: DC

## 2013-02-09 MED ORDER — EPOETIN ALFA 10000 UNIT/ML IJ SOLN
INTRAMUSCULAR | Status: AC
  Filled 2013-02-09: qty 1

## 2013-02-16 ENCOUNTER — Encounter (HOSPITAL_COMMUNITY)
Admission: RE | Admit: 2013-02-16 | Discharge: 2013-02-16 | Disposition: A | Discharge status: Home / Self Care | Payer: Medicare Other | Source: Ambulatory Visit | Attending: Nephrology | Admitting: Nephrology

## 2013-02-17 ENCOUNTER — Encounter (INDEPENDENT_AMBULATORY_CARE_PROVIDER_SITE_OTHER): Payer: Self-pay | Admitting: General Surgery

## 2013-02-17 ENCOUNTER — Telehealth (INDEPENDENT_AMBULATORY_CARE_PROVIDER_SITE_OTHER): Payer: Self-pay

## 2013-02-17 ENCOUNTER — Ambulatory Visit (INDEPENDENT_AMBULATORY_CARE_PROVIDER_SITE_OTHER): Payer: Medicare Other | Admitting: General Surgery

## 2013-02-17 VITALS — BP 158/90 | HR 71 | Temp 97.5°F | Resp 16 | Ht 64.5 in | Wt 222.8 lb

## 2013-02-17 DIAGNOSIS — N184 Chronic kidney disease, stage 4 (severe): Secondary | ICD-10-CM

## 2013-02-17 DIAGNOSIS — K429 Umbilical hernia without obstruction or gangrene: Secondary | ICD-10-CM

## 2013-02-17 NOTE — Progress Notes (Signed)
Subjective:   end-stage renal disease and umbilical hernia  Patient ID: Sean Best, male   DOB: 1933-10-02, 79 y.o.   MRN: [id number]  HPI Patient is a 79 year old male with stage IV chronic kidney disease secondary to diabetes and hypertension who is followed by Dr. Joelyn Oms. He has a functioning fistula in place but due to care issues of his wife at home who has dementia he has a desire for peritoneal dialysis. He has been to be education classes at the kidney center and felt to be a candidate for peritoneal dialysis. He also has an umbilical hernia. It is not symptomatic. He's not had any previous abdominal surgery. He comes into the office today with his daughter who is a Marine scientist and can help of a former patient of mine. He denies any abdominal or GI complaints.  Past Medical History  Diagnosis Date  . Stroke   . Claudication   . Chronic total occlusion of artery of the extremities   . Diabetes mellitus   . Hypertension   . Hyperlipidemia   . Cancer     prostate cancer  . Gout   . Overweight(278.02)   . Arthritis    Past Surgical History  Procedure Laterality Date  . Eye surgery    . Prostate surgery      prostate seed placement  . Av fistula placement, radiocephalic  [id number]    Left arm   Current Outpatient Prescriptions  Medication Sig Dispense Refill  . ACCU-CHEK COMPACT STRIPS test strip       . acetaminophen (TYLENOL) 500 MG tablet Take 500 mg by mouth every 6 (six) hours as needed.        Marland Kitchen allopurinol (ZYLOPRIM) 100 MG tablet Take 100 mg by mouth daily.        Marland Kitchen amLODipine (NORVASC) 10 MG tablet Take 10 mg by mouth daily.        Marland Kitchen aspirin 81 MG tablet Take 81 mg by mouth daily.        Marland Kitchen atorvastatin (LIPITOR) 40 MG tablet Take 40 mg by mouth daily.        . calcitRIOL (ROCALTROL) 0.25 MCG capsule Take 0.25 mcg by mouth daily.        . calcitRIOL (ROCALTROL) 0.5 MCG capsule       . clopidogrel (PLAVIX) 75 MG tablet Take 75 mg by mouth daily.        . colchicine 0.6  MG tablet Take 0.6 mg by mouth daily.        Marland Kitchen doxazosin (CARDURA) 2 MG tablet       . furosemide (LASIX) 80 MG tablet Take 80 mg by mouth 4 (four) times daily as needed.        Marland Kitchen glimepiride (AMARYL) 4 MG tablet Take 4 mg by mouth 2 (two) times daily.        . indomethacin (INDOCIN) 50 MG capsule Take 50 mg by mouth 2 (two) times daily with a meal.        . Linagliptin 5 MG TABS Take by mouth.        . losartan (COZAAR) 100 MG tablet Take 100 mg by mouth daily.        . metoprolol (LOPRESSOR) 100 MG tablet 100 mg 2 (two) times daily.       . metoprolol (LOPRESSOR) 50 MG tablet Take 50 mg by mouth 2 (two) times daily.        . mupirocin (BACTROBAN) 2 % ointment       .  oxyCODONE (OXY IR/ROXICODONE) 5 MG immediate release tablet       . sodium bicarbonate 650 MG tablet Take 650 mg by mouth 4 (four) times daily.      . sodium polystyrene (KAYEXALATE) powder Take 5 g by mouth once.       No current facility-administered medications for this visit.   No Known Allergies  Review of Systems  Constitutional: Positive for fatigue.  Respiratory: Negative.   Cardiovascular: Negative.   Gastrointestinal: Negative.        Objective:   Physical Exam BP 158/90  Pulse 71  Temp(Src) 97.5 F (36.4 C) (Temporal)  Resp 16  Ht 5' 4.5" (1.638 m)  Wt 222 lb 12.8 oz (101.061 kg)  BMI 37.67 kg/m2 General: Alert, moderately obese African American male, in no distress Skin: Warm and dry without rash or infection. HEENT: No palpable masses or thyromegaly. Sclera nonicteric. Pupils equal round and reactive. Oropharynx clear. Lymph nodes: No cervical, supraclavicular, or inguinal nodes palpable. Lungs: Breath sounds clear and equal without increased work of breathing Cardiovascular: Regular rate and rhythm without murmur. Probable S3 moderate JVD and 1-2+ lower extremity edema.  Abdomen: obese. Nondistended. Soft and nontender. No masses palpable. No organomegaly. There is a fairly large diastases with  the patient doing a sit up maneuver. There is approximately 2 cm discrete hernia at the umbilicus. Extremities: 1-2+ edema or joint swelling or deformity. No chronic venous stasis changes. Neurologic: Alert and fully oriented. Gait normal.    Assessment:     Chronic kidney disease with impending dialysis and desire for peritoneal dialysis catheter. He is felt to be an adequate candidate by his renal physician. He has an umbilical hernia that will need to be repaired at the time of surgery. I discussed the procedure with the patient and his daughter. He discussed that the hernia would need to be repaired as it will enlarge with peritoneal dialysis. He is at risk for recurrent hernia due to his obesity and diastases and her tibial dialysis. I would plan laparoscopic placement of this catheter and combined open and laparoscopic repair of his umbilical hernia with mesh. We discussed the procedure in detail including risks of general anesthesia, bleeding, infection and intestinal injury and recurrence. They understand and agree.    Plan:     Laparoscopic placement of peritoneal dialysis catheter and repair of umbilical hernia with overnight hospitalization.

## 2013-02-17 NOTE — Telephone Encounter (Signed)
Faxed request for Medical Records to [facility] @ [phone].  Fax confirmation rec'd

## 2013-02-24 ENCOUNTER — Encounter (HOSPITAL_COMMUNITY)
Admission: RE | Admit: 2013-02-24 | Discharge: 2013-02-24 | Disposition: A | Discharge status: Home / Self Care | Payer: Medicare Other | Source: Ambulatory Visit | Attending: Nephrology | Admitting: Nephrology

## 2013-02-24 LAB — POCT I-STAT 4, (NA,K, GLUC, HGB,HCT): Sodium: 143 mEq/L (ref 135–145)

## 2013-02-24 MED ORDER — EPOETIN ALFA 10000 UNIT/ML IJ SOLN
INTRAMUSCULAR | Status: AC
  Administered 2013-02-24: 10000 [IU] via SUBCUTANEOUS
  Filled 2013-02-24: qty 1

## 2013-02-24 MED ORDER — EPOETIN ALFA 10000 UNIT/ML IJ SOLN: 10000.0000 [IU] | INTRAMUSCULAR | Status: DC

## 2013-02-28 ENCOUNTER — Other Ambulatory Visit (HOSPITAL_COMMUNITY): Payer: Self-pay | Admitting: *Deleted

## 2013-02-28 NOTE — Pre-Procedure Instructions (Signed)
Sean Best  02/28/2013   Your procedure is scheduled on:  Monday, March 06, 2012 at 7:30 AM.   Report to [facility] Entrance "A" Admitting Office at 5:30 AM.   Call this number if you have problems the morning of surgery: [phone]   Remember:   Do not eat food or drink liquids after midnight Sunday, 03/05/13.   Take these medicines the morning of surgery with A SIP OF WATER:allopurinol (ZYLOPRIM), Colchicine, doxazosin (CARDURA), metoprolol (LOPRESSOR), hydrALAZINE (APRESOLINE),   acetaminophen (TYLENOL) - if needed.  Stop Aspirin as of today, 03/01/13.    Do not wear jewelry.  Do not wear lotions, powders, or cologne. You may wear deodorant.  Men may shave face and neck.  Do not bring valuables to the hospital.  [facility] is not responsible                  for any belongings or valuables.               Contacts, dentures or bridgework may not be worn into surgery.  Leave suitcase in the car. After surgery it may be brought to your room.  For patients admitted to the hospital, discharge time is determined by your                treatment team.                Special Instructions: Shower using CHG 2 nights before surgery and the night before surgery.  If you shower the day of surgery use CHG.  Use special wash - you have one bottle of CHG for all showers.  You should use approximately 1/3 of the bottle for each shower.   Please read over the following fact sheets that you were given: Pain Booklet, Coughing and Deep Breathing and Surgical Site Infection Prevention

## 2013-03-01 ENCOUNTER — Encounter (HOSPITAL_COMMUNITY)
Admission: RE | Admit: 2013-03-01 | Discharge: 2013-03-01 | Disposition: A | Discharge status: Home / Self Care | Payer: Medicare Other | Source: Ambulatory Visit | Attending: General Surgery | Admitting: General Surgery

## 2013-03-01 ENCOUNTER — Encounter (HOSPITAL_COMMUNITY): Payer: Self-pay

## 2013-03-01 DIAGNOSIS — Z01818 Encounter for other preprocedural examination: Secondary | ICD-10-CM | POA: Insufficient documentation

## 2013-03-01 DIAGNOSIS — Z01810 Encounter for preprocedural cardiovascular examination: Secondary | ICD-10-CM | POA: Insufficient documentation

## 2013-03-01 DIAGNOSIS — Z01812 Encounter for preprocedural laboratory examination: Secondary | ICD-10-CM | POA: Insufficient documentation

## 2013-03-01 HISTORY — DX: Anemia, unspecified: D64.9

## 2013-03-01 HISTORY — DX: Umbilical hernia without obstruction or gangrene: K42.9

## 2013-03-01 MED ORDER — CHLORHEXIDINE GLUCONATE 4 % EX LIQD: 1.0000 "application " | Freq: Once | CUTANEOUS | Status: DC

## 2013-03-01 NOTE — Progress Notes (Signed)
03/01/13 0919  OBSTRUCTIVE SLEEP APNEA  Have you ever been diagnosed with sleep apnea through a sleep study? No  Do you snore loudly (loud enough to be heard through closed doors)?  1  Do you often feel tired, fatigued, or sleepy during the daytime? 0  Has anyone observed you stop breathing during your sleep? 0  Do you have, or are you being treated for high blood pressure? 1  BMI more than 35 kg/m2? 1  Age over 77 years old? 1  Neck circumference greater than 40 cm/18 inches? 0 (18)  Gender: 1  Obstructive Sleep Apnea Score 5  Score 4 or greater  Results sent to PCP

## 2013-03-03 NOTE — Progress Notes (Signed)
Anesthesia Chart Review: Patient is a 80 year old male scheduled for laparoscopic placement of PD catheter and repair of umbilical hernia with mesh on 03/06/13 by Dr. Excell Seltzer.  History includes obesity, non-smoker, CVA, DM2, HTN, HLD, CKD stage V with history of LUE AVF (not yet on hemodialysis), anemia, gout, claudication, prostate cancer s/p radioactive seed implant, cataract extraction. OSA screening score is a 5.  PCP is listed as Dr. Seward Carol. Nephrologist is Dr. Pearson Grippe.  EKG on 03/01/13 showed NSR, possible LAE.  CXR on 03/01/13 showed: 1. Cardiomegaly with mild pulmonary venous congestion. Tiny left pleural effusion. No pulmonary edema. 2. No focal pulmonary infiltrate noted. Exam is otherwise stable from 05/27/2010.   ISTAT4 from 02/24/13 noted.  Plan for CBC and BMET on the day of surgery.  (Prior renal notes scanned under Media tab list BUN/Cr 71/6.74 in 07/2012.)  If labs are stable/acceptable then I would anticipate that he could proceed as planned.  [facility] Short Stay Center/Anesthesiology Phone [phone] 03/03/2013 10:33 AM

## 2013-03-05 MED ORDER — CEFAZOLIN SODIUM-DEXTROSE 2-3 GM-% IV SOLR
2.0000 g | INTRAVENOUS | Status: AC
Start: 2013-03-06 — End: 2013-03-06
  Administered 2013-03-06: 2 g via INTRAVENOUS
  Filled 2013-03-05: qty 50

## 2013-03-06 ENCOUNTER — Ambulatory Visit (HOSPITAL_COMMUNITY): Payer: Medicare Other | Admitting: Anesthesiology

## 2013-03-06 ENCOUNTER — Ambulatory Visit (HOSPITAL_COMMUNITY)
Admission: RE | Admit: 2013-03-06 | Discharge: 2013-03-06 | Disposition: A | Discharge status: Home / Self Care | Payer: Medicare Other | Source: Ambulatory Visit | Attending: General Surgery | Admitting: General Surgery

## 2013-03-06 ENCOUNTER — Encounter (HOSPITAL_COMMUNITY)
Admission: RE | Disposition: A | Discharge status: Home / Self Care | Payer: Self-pay | Source: Ambulatory Visit | Attending: General Surgery

## 2013-03-06 ENCOUNTER — Encounter (HOSPITAL_COMMUNITY): Payer: Self-pay | Admitting: *Deleted

## 2013-03-06 ENCOUNTER — Encounter (HOSPITAL_COMMUNITY): Payer: Medicare Other | Admitting: Vascular Surgery

## 2013-03-06 DIAGNOSIS — N186 End stage renal disease: Secondary | ICD-10-CM | POA: Insufficient documentation

## 2013-03-06 DIAGNOSIS — I120 Hypertensive chronic kidney disease with stage 5 chronic kidney disease or end stage renal disease: Secondary | ICD-10-CM | POA: Insufficient documentation

## 2013-03-06 DIAGNOSIS — Z6837 Body mass index (BMI) 37.0-37.9, adult: Secondary | ICD-10-CM | POA: Insufficient documentation

## 2013-03-06 DIAGNOSIS — N184 Chronic kidney disease, stage 4 (severe): Secondary | ICD-10-CM

## 2013-03-06 DIAGNOSIS — E663 Overweight: Secondary | ICD-10-CM | POA: Insufficient documentation

## 2013-03-06 DIAGNOSIS — K429 Umbilical hernia without obstruction or gangrene: Secondary | ICD-10-CM

## 2013-03-06 DIAGNOSIS — M129 Arthropathy, unspecified: Secondary | ICD-10-CM | POA: Insufficient documentation

## 2013-03-06 DIAGNOSIS — Z7902 Long term (current) use of antithrombotics/antiplatelets: Secondary | ICD-10-CM | POA: Insufficient documentation

## 2013-03-06 DIAGNOSIS — Z8546 Personal history of malignant neoplasm of prostate: Secondary | ICD-10-CM | POA: Insufficient documentation

## 2013-03-06 DIAGNOSIS — I70219 Atherosclerosis of native arteries of extremities with intermittent claudication, unspecified extremity: Secondary | ICD-10-CM | POA: Insufficient documentation

## 2013-03-06 DIAGNOSIS — Z7982 Long term (current) use of aspirin: Secondary | ICD-10-CM | POA: Insufficient documentation

## 2013-03-06 DIAGNOSIS — Z8673 Personal history of transient ischemic attack (TIA), and cerebral infarction without residual deficits: Secondary | ICD-10-CM | POA: Insufficient documentation

## 2013-03-06 DIAGNOSIS — M109 Gout, unspecified: Secondary | ICD-10-CM | POA: Insufficient documentation

## 2013-03-06 DIAGNOSIS — E785 Hyperlipidemia, unspecified: Secondary | ICD-10-CM | POA: Insufficient documentation

## 2013-03-06 DIAGNOSIS — E1129 Type 2 diabetes mellitus with other diabetic kidney complication: Secondary | ICD-10-CM | POA: Insufficient documentation

## 2013-03-06 HISTORY — PX: UMBILICAL HERNIA REPAIR: SHX196

## 2013-03-06 HISTORY — PX: CAPD INSERTION: SHX5233

## 2013-03-06 LAB — CBC
HCT: 32.3 % — ABNORMAL LOW (ref 39.0–52.0)
HEMOGLOBIN: 10.1 g/dL — AB (ref 13.0–17.0)
MCH: 29.5 pg (ref 26.0–34.0)
MCHC: 31.3 g/dL (ref 30.0–36.0)
MCV: 94.4 fL (ref 78.0–100.0)
PLATELETS: 234 10*3/uL (ref 150–400)
RBC: 3.42 MIL/uL — ABNORMAL LOW (ref 4.22–5.81)
RDW: 15.8 % — AB (ref 11.5–15.5)
WBC: 7 10*3/uL (ref 4.0–10.5)

## 2013-03-06 LAB — BASIC METABOLIC PANEL
BUN: 72 mg/dL — AB (ref 6–23)
CALCIUM: 9.6 mg/dL (ref 8.4–10.5)
CO2: 16 mEq/L — ABNORMAL LOW (ref 19–32)
Chloride: 105 mEq/L (ref 96–112)
Creatinine, Ser: 9.19 mg/dL — ABNORMAL HIGH (ref 0.50–1.35)
GFR calc non Af Amer: 5 mL/min — ABNORMAL LOW (ref >90)
GFR, EST AFRICAN AMERICAN: 6 mL/min — AB (ref >90)
Glucose, Bld: 106 mg/dL — ABNORMAL HIGH (ref 70–99)
POTASSIUM: 4.8 meq/L (ref 3.7–5.3)
Sodium: 140 mEq/L (ref 137–147)

## 2013-03-06 LAB — GLUCOSE, CAPILLARY: GLUCOSE-CAPILLARY: 103 mg/dL — AB (ref 70–99)

## 2013-03-06 SURGERY — LAPAROSCOPIC INSERTION CONTINUOUS AMBULATORY PERITONEAL DIALYSIS  (CAPD) CATHETER
Anesthesia: General

## 2013-03-06 MED ORDER — OXYCODONE HCL 5 MG/5ML PO SOLN: 5.0000 mg | Freq: Once | ORAL | Status: AC | PRN

## 2013-03-06 MED ORDER — OXYCODONE-ACETAMINOPHEN 5-325 MG PO TABS: 1.0000 | ORAL_TABLET | ORAL | Status: DC | PRN

## 2013-03-06 MED ORDER — ONDANSETRON HCL 4 MG/2ML IJ SOLN
INTRAMUSCULAR | Status: DC | PRN
  Administered 2013-03-06: 4 mg via INTRAVENOUS

## 2013-03-06 MED ORDER — BUPIVACAINE-EPINEPHRINE (PF) 0.5% -1:200000 IJ SOLN
INTRAMUSCULAR | Status: AC
  Filled 2013-03-06: qty 10

## 2013-03-06 MED ORDER — BUPIVACAINE-EPINEPHRINE 0.5% -1:200000 IJ SOLN
INTRAMUSCULAR | Status: DC | PRN
  Administered 2013-03-06: 30 mL

## 2013-03-06 MED ORDER — 0.9 % SODIUM CHLORIDE (POUR BTL) OPTIME
TOPICAL | Status: DC | PRN
  Administered 2013-03-06: 1000 mL

## 2013-03-06 MED ORDER — BUPIVACAINE-EPINEPHRINE (PF) 0.25% -1:200000 IJ SOLN
INTRAMUSCULAR | Status: AC
  Filled 2013-03-06: qty 30

## 2013-03-06 MED ORDER — FENTANYL CITRATE 0.05 MG/ML IJ SOLN: 25.0000 ug | INTRAMUSCULAR | Status: DC | PRN

## 2013-03-06 MED ORDER — BACITRACIN ZINC 500 UNIT/GM EX OINT
TOPICAL_OINTMENT | CUTANEOUS | Status: AC
  Filled 2013-03-06: qty 15

## 2013-03-06 MED ORDER — FENTANYL CITRATE 0.05 MG/ML IJ SOLN: 50.0000 ug | Freq: Once | INTRAMUSCULAR | Status: DC

## 2013-03-06 MED ORDER — MIDAZOLAM HCL 2 MG/2ML IJ SOLN: 1.0000 mg | INTRAMUSCULAR | Status: DC | PRN

## 2013-03-06 MED ORDER — LIDOCAINE HCL (CARDIAC) 20 MG/ML IV SOLN
INTRAVENOUS | Status: DC | PRN
  Administered 2013-03-06: 100 mg via INTRAVENOUS

## 2013-03-06 MED ORDER — OXYCODONE HCL 5 MG PO TABS
ORAL_TABLET | ORAL | Status: AC
  Filled 2013-03-06: qty 1

## 2013-03-06 MED ORDER — FENTANYL CITRATE 0.05 MG/ML IJ SOLN
INTRAMUSCULAR | Status: DC | PRN
  Administered 2013-03-06: 100 ug via INTRAVENOUS

## 2013-03-06 MED ORDER — ROCURONIUM BROMIDE 100 MG/10ML IV SOLN
INTRAVENOUS | Status: DC | PRN
  Administered 2013-03-06: 50 mg via INTRAVENOUS

## 2013-03-06 MED ORDER — GLYCOPYRROLATE 0.2 MG/ML IJ SOLN
INTRAMUSCULAR | Status: DC | PRN
  Administered 2013-03-06: .8 mg via INTRAVENOUS

## 2013-03-06 MED ORDER — EPHEDRINE SULFATE 50 MG/ML IJ SOLN
INTRAMUSCULAR | Status: DC | PRN
  Administered 2013-03-06 (×3): 10 mg via INTRAVENOUS

## 2013-03-06 MED ORDER — OXYCODONE HCL 5 MG PO TABS
5.0000 mg | ORAL_TABLET | Freq: Once | ORAL | Status: AC | PRN
  Administered 2013-03-06: 5 mg via ORAL

## 2013-03-06 MED ORDER — PROPOFOL 10 MG/ML IV BOLUS
INTRAVENOUS | Status: DC | PRN
  Administered 2013-03-06: 100 mg via INTRAVENOUS

## 2013-03-06 MED ORDER — PHENYLEPHRINE HCL 10 MG/ML IJ SOLN
INTRAMUSCULAR | Status: DC | PRN
  Administered 2013-03-06 (×3): 80 ug via INTRAVENOUS
  Administered 2013-03-06: 120 ug via INTRAVENOUS

## 2013-03-06 MED ORDER — SODIUM CHLORIDE 0.9 % IV SOLN
INTRAVENOUS | Status: DC | PRN
  Administered 2013-03-06 (×2): via INTRAVENOUS

## 2013-03-06 MED ORDER — NEOSTIGMINE METHYLSULFATE 1 MG/ML IJ SOLN
INTRAMUSCULAR | Status: DC | PRN
  Administered 2013-03-06: 4 mg via INTRAVENOUS

## 2013-03-06 MED ORDER — BACITRACIN ZINC 500 UNIT/GM EX OINT
TOPICAL_OINTMENT | CUTANEOUS | Status: DC | PRN
  Administered 2013-03-06: 1 via TOPICAL

## 2013-03-06 SURGICAL SUPPLY — 65 items
ADAPTER CATH SAFE LCK II CO PK (MISCELLANEOUS) ×1 IMPLANT
ADAPTER SAFE LOCK II CO PACK (MISCELLANEOUS) ×2
BLADE SURG 10 STRL SS (BLADE) ×3 IMPLANT
BLADE SURG 15 STRL LF DISP TIS (BLADE) ×1 IMPLANT
BLADE SURG 15 STRL SS (BLADE) ×2
BLADE SURG ROTATE 9660 (MISCELLANEOUS) ×3 IMPLANT
CANISTER SUCTION 2500CC (MISCELLANEOUS) IMPLANT
CATH MONCRIEF POPOVICH (CATHETERS) IMPLANT
CHLORAPREP W/TINT 26ML (MISCELLANEOUS) ×3 IMPLANT
COIL SWAN NECK LT (MISCELLANEOUS) IMPLANT
COIL SWAN NECK RT (MISCELLANEOUS) ×3 IMPLANT
COVER SURGICAL LIGHT HANDLE (MISCELLANEOUS) ×3 IMPLANT
DECANTER SPIKE VIAL GLASS SM (MISCELLANEOUS) ×3 IMPLANT
DERMABOND ADVANCED (GAUZE/BANDAGES/DRESSINGS) ×2
DERMABOND ADVANCED .7 DNX12 (GAUZE/BANDAGES/DRESSINGS) ×1 IMPLANT
DISSECTOR BLUNT TIP ENDO 5MM (MISCELLANEOUS) IMPLANT
DRAPE PED LAPAROTOMY (DRAPES) ×3 IMPLANT
DRAPE UTILITY 15X26 W/TAPE STR (DRAPE) ×6 IMPLANT
ELECT CAUTERY BLADE 6.4 (BLADE) ×3 IMPLANT
ELECT REM PT RETURN 9FT ADLT (ELECTROSURGICAL) ×3
ELECTRODE REM PT RTRN 9FT ADLT (ELECTROSURGICAL) ×1 IMPLANT
GAUZE SPONGE 2X2 8PLY STRL LF (GAUZE/BANDAGES/DRESSINGS) ×1 IMPLANT
GLOVE BIO SURGEON STRL SZ7.5 (GLOVE) ×6 IMPLANT
GLOVE BIOGEL PI IND STRL 6.5 (GLOVE) ×1 IMPLANT
GLOVE BIOGEL PI IND STRL 7.5 (GLOVE) ×2 IMPLANT
GLOVE BIOGEL PI IND STRL 8 (GLOVE) ×1 IMPLANT
GLOVE BIOGEL PI INDICATOR 6.5 (GLOVE) ×2
GLOVE BIOGEL PI INDICATOR 7.5 (GLOVE) ×4
GLOVE BIOGEL PI INDICATOR 8 (GLOVE) ×2
GLOVE SS BIOGEL STRL SZ 7.5 (GLOVE) ×1 IMPLANT
GLOVE SUPERSENSE BIOGEL SZ 7.5 (GLOVE) ×2
GOWN STRL NON-REIN LRG LVL3 (GOWN DISPOSABLE) ×6 IMPLANT
GOWN STRL REIN XL XLG (GOWN DISPOSABLE) ×3 IMPLANT
KIT BASIN OR (CUSTOM PROCEDURE TRAY) ×3 IMPLANT
KIT ROOM TURNOVER OR (KITS) ×3 IMPLANT
MESH VENTRALEX ST 8CM LRG (Mesh General) ×3 IMPLANT
NEEDLE HYPO 25GX1X1/2 BEV (NEEDLE) ×3 IMPLANT
NS IRRIG 1000ML POUR BTL (IV SOLUTION) ×3 IMPLANT
PACK SURGICAL SETUP 50X90 (CUSTOM PROCEDURE TRAY) ×3 IMPLANT
PAD ARMBOARD 7.5X6 YLW CONV (MISCELLANEOUS) ×6 IMPLANT
PENCIL BUTTON HOLSTER BLD 10FT (ELECTRODE) ×3 IMPLANT
SCALPEL HARMONIC ACE (MISCELLANEOUS) IMPLANT
SET EXTENSION TUBING 8  CATH (SET/KITS/TRAYS/PACK) ×3 IMPLANT
SET IRRIG TUBING LAPAROSCOPIC (IRRIGATION / IRRIGATOR) IMPLANT
SLEEVE ENDOPATH XCEL 5M (ENDOMECHANICALS) ×9 IMPLANT
SPONGE GAUZE 2X2 STER 10/PKG (GAUZE/BANDAGES/DRESSINGS) ×2
SPONGE LAP 4X18 X RAY DECT (DISPOSABLE) ×3 IMPLANT
SUT ETHILON 5 0 PS 2 18 (SUTURE) IMPLANT
SUT MON AB 4-0 PC3 18 (SUTURE) ×6 IMPLANT
SUT MON AB 5-0 PS2 18 (SUTURE) IMPLANT
SUT PROLENE 0 CT 1 CR/8 (SUTURE) ×3 IMPLANT
SUT PROLENE 2 0 CT2 30 (SUTURE) ×6 IMPLANT
SYR BULB 3OZ (MISCELLANEOUS) ×3 IMPLANT
SYR CONTROL 10ML LL (SYRINGE) ×3 IMPLANT
TAPE CLOTH SURG 4X10 WHT LF (GAUZE/BANDAGES/DRESSINGS) ×3 IMPLANT
TOWEL OR 17X24 6PK STRL BLUE (TOWEL DISPOSABLE) ×3 IMPLANT
TOWEL OR 17X26 10 PK STRL BLUE (TOWEL DISPOSABLE) ×3 IMPLANT
TRAY LAPAROSCOPIC (CUSTOM PROCEDURE TRAY) ×3 IMPLANT
TROCAR FALLER TUNNELING (TROCAR) ×3 IMPLANT
TROCAR XCEL 12X100 BLDLESS (ENDOMECHANICALS) ×3 IMPLANT
TROCAR XCEL NON-BLD 5MMX100MML (ENDOMECHANICALS) ×6 IMPLANT
TUBE CONNECTING 12'X1/4 (SUCTIONS)
TUBE CONNECTING 12X1/4 (SUCTIONS) IMPLANT
WATER STERILE IRR 1000ML POUR (IV SOLUTION) IMPLANT
YANKAUER SUCT BULB TIP NO VENT (SUCTIONS) IMPLANT

## 2013-03-06 NOTE — H&P (View-Only) (Signed)
Subjective:   end-stage renal disease and umbilical hernia  Patient ID: Sean Best, male   DOB: [birth year], 80 y.o.   MRN: [id number]  HPI Patient is a 80 year old male with stage IV chronic kidney disease secondary to diabetes and hypertension who is followed by Dr. Joelyn Oms. He has a functioning fistula in place but due to care issues of his wife at home who has dementia he has a desire for peritoneal dialysis. He has been to be education classes at the kidney center and felt to be a candidate for peritoneal dialysis. He also has an umbilical hernia. It is not symptomatic. He's not had any previous abdominal surgery. He comes into the office today with his daughter who is a Marine scientist and can help of a former patient of mine. He denies any abdominal or GI complaints.  Past Medical History  Diagnosis Date  . Stroke   . Claudication   . Chronic total occlusion of artery of the extremities   . Diabetes mellitus   . Hypertension   . Hyperlipidemia   . Cancer     prostate cancer  . Gout   . Overweight(278.02)   . Arthritis    Past Surgical History  Procedure Laterality Date  . Eye surgery    . Prostate surgery      prostate seed placement  . Av fistula placement, radiocephalic  123456    Left arm   Current Outpatient Prescriptions  Medication Sig Dispense Refill  . ACCU-CHEK COMPACT STRIPS test strip       . acetaminophen (TYLENOL) 500 MG tablet Take 500 mg by mouth every 6 (six) hours as needed.        Marland Kitchen allopurinol (ZYLOPRIM) 100 MG tablet Take 100 mg by mouth daily.        Marland Kitchen amLODipine (NORVASC) 10 MG tablet Take 10 mg by mouth daily.        Marland Kitchen aspirin 81 MG tablet Take 81 mg by mouth daily.        Marland Kitchen atorvastatin (LIPITOR) 40 MG tablet Take 40 mg by mouth daily.        . calcitRIOL (ROCALTROL) 0.25 MCG capsule Take 0.25 mcg by mouth daily.        . calcitRIOL (ROCALTROL) 0.5 MCG capsule       . clopidogrel (PLAVIX) 75 MG tablet Take 75 mg by mouth daily.        . colchicine 0.6  MG tablet Take 0.6 mg by mouth daily.        Marland Kitchen doxazosin (CARDURA) 2 MG tablet       . furosemide (LASIX) 80 MG tablet Take 80 mg by mouth 4 (four) times daily as needed.        Marland Kitchen glimepiride (AMARYL) 4 MG tablet Take 4 mg by mouth 2 (two) times daily.        . indomethacin (INDOCIN) 50 MG capsule Take 50 mg by mouth 2 (two) times daily with a meal.        . Linagliptin 5 MG TABS Take by mouth.        . losartan (COZAAR) 100 MG tablet Take 100 mg by mouth daily.        . metoprolol (LOPRESSOR) 100 MG tablet 100 mg 2 (two) times daily.       . metoprolol (LOPRESSOR) 50 MG tablet Take 50 mg by mouth 2 (two) times daily.        . mupirocin (BACTROBAN) 2 % ointment       .  oxyCODONE (OXY IR/ROXICODONE) 5 MG immediate release tablet       . sodium bicarbonate 650 MG tablet Take 650 mg by mouth 4 (four) times daily.      . sodium polystyrene (KAYEXALATE) powder Take 5 g by mouth once.       No current facility-administered medications for this visit.   No Known Allergies  Review of Systems  Constitutional: Positive for fatigue.  Respiratory: Negative.   Cardiovascular: Negative.   Gastrointestinal: Negative.        Objective:   Physical Exam BP 158/90  Pulse 71  Temp(Src) 97.5 F (36.4 C) (Temporal)  Resp 16  Ht 5' 4.5" (1.638 m)  Wt 222 lb 12.8 oz (101.061 kg)  BMI 37.67 kg/m2 General: Alert, moderately obese African American male, in no distress Skin: Warm and dry without rash or infection. HEENT: No palpable masses or thyromegaly. Sclera nonicteric. Pupils equal round and reactive. Oropharynx clear. Lymph nodes: No cervical, supraclavicular, or inguinal nodes palpable. Lungs: Breath sounds clear and equal without increased work of breathing Cardiovascular: Regular rate and rhythm without murmur. Probable S3 moderate JVD and 1-2+ lower extremity edema.  Abdomen: obese. Nondistended. Soft and nontender. No masses palpable. No organomegaly. There is a fairly large diastases with  the patient doing a sit up maneuver. There is approximately 2 cm discrete hernia at the umbilicus. Extremities: 1-2+ edema or joint swelling or deformity. No chronic venous stasis changes. Neurologic: Alert and fully oriented. Gait normal.    Assessment:     Chronic kidney disease with impending dialysis and desire for peritoneal dialysis catheter. He is felt to be an adequate candidate by his renal physician. He has an umbilical hernia that will need to be repaired at the time of surgery. I discussed the procedure with the patient and his daughter. He discussed that the hernia would need to be repaired as it will enlarge with peritoneal dialysis. He is at risk for recurrent hernia due to his obesity and diastases and her tibial dialysis. I would plan laparoscopic placement of this catheter and combined open and laparoscopic repair of his umbilical hernia with mesh. We discussed the procedure in detail including risks of general anesthesia, bleeding, infection and intestinal injury and recurrence. They understand and agree.    Plan:     Laparoscopic placement of peritoneal dialysis catheter and repair of umbilical hernia with overnight hospitalization.

## 2013-03-06 NOTE — Progress Notes (Signed)
Patient is a end stage renal patient. Dr. Chriss Driver notified of discharge criteria, if the patient needs to void prior to discharge.  Dr. Chriss Driver states no, but if the patient has any problems to call back to hospital. Patient alert and oriented. VSS. Patient tolerating fluids. Patient ready for phase II.

## 2013-03-06 NOTE — Transfer of Care (Signed)
Immediate Anesthesia Transfer of Care Note  Patient: Sean Best  Procedure(s) Performed: Procedure(s): LAPAROSCOPIC INSERTION CONTINUOUS AMBULATORY PERITONEAL DIALYSIS  (CAPD) CATHETER (N/A) HERNIA REPAIR UMBILICAL WITH MESH (N/A)  Patient Location: PACU  Anesthesia Type:General  Level of Consciousness: awake, alert  and oriented  Airway & Oxygen Therapy: Patient connected to face mask oxygen  Post-op Assessment: Report given to PACU RN  Post vital signs: stable  Complications: No apparent anesthesia complications

## 2013-03-06 NOTE — Anesthesia Procedure Notes (Signed)
Procedure Name: Intubation Date/Time: 03/06/2013 7:42 AM Performed by: Kyung Rudd Pre-anesthesia Checklist: Patient identified, Emergency Drugs available, Suction available, Patient being monitored and Timeout performed Patient Re-evaluated:Patient Re-evaluated prior to inductionOxygen Delivery Method: Circle system utilized Preoxygenation: Pre-oxygenation with 100% oxygen Intubation Type: IV induction Ventilation: Mask ventilation without difficulty Laryngoscope Size: Mac and 4 Grade View: Grade I Tube type: Oral Tube size: 7.5 mm Number of attempts: 1 Airway Equipment and Method: Stylet Placement Confirmation: ETT inserted through vocal cords under direct vision,  positive ETCO2 and breath sounds checked- equal and bilateral Secured at: 22 cm Tube secured with: Tape Dental Injury: Teeth and Oropharynx as per pre-operative assessment

## 2013-03-06 NOTE — Preoperative (Signed)
Beta Blockers   Reason not to administer Beta Blockers:Not Applicable 

## 2013-03-06 NOTE — Anesthesia Postprocedure Evaluation (Signed)
  Anesthesia Post-op Note  Patient: Sean Best  Procedure(s) Performed: Procedure(s): LAPAROSCOPIC INSERTION CONTINUOUS AMBULATORY PERITONEAL DIALYSIS  (CAPD) CATHETER (N/A) HERNIA REPAIR UMBILICAL WITH MESH (N/A)  Patient Location: PACU  Anesthesia Type:General  Level of Consciousness: awake  Airway and Oxygen Therapy: Patient Spontanous Breathing  Post-op Pain: mild  Post-op Assessment: Post-op Vital signs reviewed, Patient's Cardiovascular Status Stable, Respiratory Function Stable, Patent Airway, No signs of Nausea or vomiting and Pain level controlled  Post-op Vital Signs: Reviewed and stable  Complications: No apparent anesthesia complications

## 2013-03-06 NOTE — Discharge Instructions (Signed)
PERITONEAL DIALYSIS (CAPD) CATHETER PLACEMENT:  POST OPERATIVE INSTRUCTIONS  1. DIET: Follow a light bland diet the first 24 hours after arrival home, such as soup, liquids, crackers, etc.  Be sure to include lots of fluids daily.  Avoid fast food or heavy meals as your are more likely to get nauseated.   2. Take your usually prescribed home medications unless otherwise directed. 3. PAIN CONTROL: a. Pain is best controlled by a usual combination of three different methods TOGETHER: i. Ice/Heat ii. Tylenol (over the counter pain medication) iii. Prescription pain medication b. Most patients will experience some swelling and bruising around the incisions.  Ice packs or heating pads (30-60 minutes up to 6 times a day) will help. Use ice for the first few days to help decrease swelling and bruising, then switch to heat to help relax tight/sore spots and speed recovery.  Some people prefer to use ice alone, heat alone, alternating between ice & heat.  Experiment to what works for you.  Swelling and bruising can take several weeks to resolve.   c. It is helpful to take an over-the-counter pain medication regularly for the first few weeks.  Using acetaminophen (Tylenol, etc) 500-650mg  four times a day (every meal & bedtime) is usually safest since NSAIDs are not advisable in patients with kidney disease. d. A  prescription for pain medication (such as oxycodone, hydrocodone, etc) should be given to you upon discharge.  Take your pain medication as prescribed.  i. If you are having problems/concerns with the prescription medicine (does not control pain, nausea, vomiting, rash, itching, etc), please call us [phone] to see if we need to switch you to a different pain medicine that will work better for you and/or control your side effect better. ii. If you need a refill on your pain medication, please contact your pharmacy.  They will contact our office to request authorization. Prescriptions will not be  filled after 5 pm or on week-ends. 4. Avoid getting constipated.  Between the surgery and the pain medications, it is common to experience some constipation.  Increasing fluid intake and taking a fiber supplement (such as Metamucil, Citrucel, FiberCon, MiraLax, etc) 1-2 times a day regularly will usually help prevent this problem from occurring.  A mild laxative (prune juice, Milk of Magnesia, MiraLax, etc) should be taken according to package directions if there are no bowel movements after 48 hours.   5. Wash / shower every day.  You may shower over the dressings as they are waterproof.  Continue to shower over incision(s) after the dressing is off. 6. The Peritoneal Dialysis nurse will remove your waterproof bandages in the Dialysis Center a few days after surgery.  Do not remove the bandages until seen by them. 7. ACTIVITIES as tolerated:   a. You may resume regular (light) daily activities beginning the next day--such as daily self-care, walking, climbing stairs--gradually increasing activities as tolerated.  If you can walk 30 minutes without difficulty, it is safe to try more intense activity such as jogging, treadmill, bicycling, low-impact aerobics, swimming, etc. b. Save the most intensive and strenuous activity for last such as sit-ups, heavy lifting, contact sports, etc  Refrain from any heavy lifting or straining until you are off narcotics for pain control.   c. DO NOT PUSH THROUGH PAIN.  Let pain be your guide: If it hurts to do something, don't do it.  Pain is your body warning you to avoid that activity for another week until the pain goes  down. d. You may drive when you are no longer taking prescription pain medication, you can comfortably wear a seatbelt, and you can safely maneuver your car and apply brakes. e. Dennis Bast may have sexual intercourse when it is comfortable.  FOLLOW UP with the Peritoneal Dialysis nurses closely after surgery.  Call [phone] to help arrange  training/flushes of cathete    -The CAPD nurses & Nephrology usually follow you closely, making the need for follow-up in our office redundant and therefore not needed.  If they or you have concerns, please call us for possible follow-up in our office  -Please call [facility] at [phone] only as needed.  WHEN TO CALL us [phone]: 1. Poor pain control 2. Reactions / problems with new medications (rash/itching, nausea, etc)  3. Fever over 101.5 F (38.5 C) 4. Worsening swelling or bruising 5. Continued bleeding from incision. 6. Increased pain, redness, or drainage from the incision   The clinic staff is available to answer your questions during regular business hours (8:30am-5pm).  Please dont hesitate to call and ask to speak to one of our nurses for clinical concerns.   If you have a medical emergency, go to the nearest emergency room or call 911.  A surgeon from [facility] is always on call at the hospitals  9. IF YOU HAVE DISABILITY OR FAMILY LEAVE FORMS, BRING THEM TO THE OFFICE FOR PROCESSING.  DO NOT GIVE THEM TO YOUR DOCTOR.  [facility], [address], [place], [place], [place]  [address] ? MAIN: [phone] ? TOLL FREE: [phone] ?  FAX [phone] [email or web address]  Peritoneal Dialysis - An Overview Dialysis can be done using a machine outside of the body (hemodialysis). Or, it can be done inside the body (peritoneal dialysis). The word "peritoneal" refers to the lining or membrane of the belly (abdominal cavity). The peritoneal membrane is a thin, plastic-like lining inside the belly that covers the organs and fits in the abdominal or peritoneal cavity, such as the stomach, liver and the kidneys. This lining works like a filter. It will allow certain things to pass from your blood through the lining and into a special solution that has been placed into your belly. In this type of dialysis, the peritoneum is used to  help clean the blood.  If you need dialysis, your kidneys are not working right. Healthy kidneys take out extra water and waste products, which becomes urine. When the kidneys do not do this, serious problems can develop. The waste and water build up in the blood. Your hands and feet might swell. You may feel tired, weak or sick to your stomach. Also, your blood pressure may rise. If not treated, you could die. Dialysis is a treatment that does the work that your kidneys would do if they were healthy.  It cleans your blood.   It will make sure your body has the right amount of certain chemicals that it needs. They include potassium, sodium and bicarbonate.   It will help control your blood pressure.  UNDERSTANDING PERITONEAL DIALYSIS  Here is how peritoneal dialysis works:   First, you will have surgery to put a soft plastic tube (catheter) into your belly (abdomen). This will allow you to easily connect yourself to special tubing, which will then let a special dialysis solution to be placed into your abdomen.   For each treatment, you will need at least one bag of dialysis solution (a liquid called dialysate). It is  a mix of water that is pure and free of germs (sterile), sugar (dextrose) and the nutrients and minerals found in your blood. Sometimes, more than one bag is needed to get the right amount of fluid for your abdomen. Your caregiver will explain what size and how many bags you will need.   The dialysate is slowly put through the catheter to fill the abdomen (called the peritoneal cavity). This dialysate will need to stay in your body for 3-4 hours. This is known as the dwell time.   The solution is working to clean the blood and remove wastes from your body. At the end of this time, the solution is drained from your body through tubing into an empty bag. It is then replaced with a fresh dialysate.   The draining and replacing of the dialysate is called an exchange or cycle. The  catheter is capped after each exchange. Once the solution is in your body, you are then free to do whatever you would like until the next exchange. Most people will need to do 4-5 exchanges each day.   There are two different methods that can be used.   Continuous ambulatory peritoneal dialysis (CAPD): You put the solution into your abdomen, cap your catheter and then go about your day. Several hours later, you reconnect to a tubing set up, drain out the solution and then put more solution in. This is done several times a day. No machine is needed.   Continuous cycler-assisted peritoneal dialysis (CCPD): A machine is used, which fills the abdomen with dialysate and then drains it. This happens several times. It usually is done at night while you are sleeping. When you wake up, you can disconnect from the machine and are free to go to go about your day.  PREPARING FOR EXCHANGES  Discuss the details of the procedure with your caregivers. You will be working with a nurse who is specially trained in doing dialysis. Make sure you understand:   How to do an exchange.   How much solution you need.   What type of solution you will need.   How often you should do an exchange. Ask:   How many times each day?   When? At meals? At bedtime?   Always keep the dialysate bags and other supplies in a cool, clean and dry place.   Keeping everything clean is very important.   The catheter and its cap must be free from germs (sterile)   The adapter also must be sterile. It attaches the dialysis bag and tubing to the catheter.   Clean the area of your body around the catheter every day. Use a chemical that fights infection (antiseptic).   Wash your hands thoroughly before starting an exchange.   You may be taught to wear a mask to cover your nose and mouth. This makes infection less likely to happen.   You may be taught to close doors, windows and turn off any fans before doing an exchange.   Check  the dialysate bag very carefully.   Make sure it is the right size bag for you. This information is on the label.   Also, make sure it is the right mixture. For some people, the dialysate contents vary. For instance, the mixture might be a stronger solution for overnight.   Check the expiration date (the last date you can use the bag). It also is on the label. If the date has gone by, throw away the bag.  The solution should be clear. You should be able to see any writing on the side of the bag clearly through the solution. Do not use a cloudy solution.   Gently squeeze the bag to make sure there are no leaks.   Use a dry heating pad to warm the dialysate in the bag. Leave the cover on the bag while you do this.   This is for comfort. You can skip this step if you want.   Never place the bag of solution under warm or hot water. Water from a faucet is not sterile and could cause germs to get into the bag. Infection could then result.  PERFORMING AN EXCHANGE  For continuous ambulatory dialysis:   Attach the dialysis bag and tubing to your catheter. Hang the bag so that gravity (the natural downward pull) draws the solution down and into your abdomen once the clamps are opened. This should take about 10 minutes.   Remove the bag and tubing from the catheter. Cap the catheter.   The solution stays in the abdomen for 3-4 hours (dwell time). The solution is working to clean the blood and remove wastes from your body.   When you are ready to drain the solution for another exchange, take the cap off the catheter. Then, attach the catheter to tubing, which is attached to an empty bag. Place this empty bag below the abdomen or on the floor or stool and undo the clamps.   Gravity helps pull the fluid out of the abdomen and into the bag. The fluid in the bag may look yellow and clear, like urine. It usually takes about 20 minutes to drain the fluid out of the abdomen.   When the solution has  drained, start the process again by infusing a new bag of dialysate and then capping the catheter.   This should continue until you have used all of the solution that you are to use each day.   Sometimes, a small machine is used overnight. It is called a mini-cycler. This is done if the body cannot go all night without an exchange. The machine lets you sleep without having to get up and do an exchange.   For continuous cycler-assisted dialysis:   You will be taught how to set up or program your machine.   When you are ready for bed, put the dialysate bags onto the cycler machine. Put on exactly the number of bags that your caregiver said to use.   Connect your catheter to the machine and turn the cycler machine on.   Overnight, the cycler will do several exchanges. It often does three to five, sometimes more.   Solution that is in your abdomen in the morning will stay during the day. The machine is set to make the daytime solution stronger, if that is needed.   In the morning, you will disconnect from the machine and cap your catheter and go about your day.   Sometimes, an extra exchange is done during the day. This may be needed to remove excess waste or fluid.  IMPORTANT REMINDERS  You will need to follow a very strict schedule. Every step of the dialysis procedure must be done every day. Sometimes, several times a day. Altogether, this might take an extra 2 hours or more. However, you must stick to the routine. Do not skip a day. Do not skip a procedure.   Some people find it helpful to work with a Social worker or Education officer, museum in  addition to the renal (kidney) nurse. They can help you figure out how to change your daily routine to fit in the dialysis sessions.   You may need to change your diet. Ask your caregiver for advice, or talk with a nutritionist about what you should and should not eat.   You will need to weigh yourself every day and keep track of what your weight is.   You  may be taught how to check your blood pressure before every exchange. Your blood pressure reading will help determine what type of solution to use. If your blood pressure is too high, you may need a stronger solution.  RISKS AND COMPLICATIONS  Possible problems vary, depending on the method you use. Your overall health also can have an effect. Problems that could develop because of dialysis include:  Infection. This is the most common problem. It could occur:   In the peritoneum. This is called peritonitis.   Around the catheter.   Weight gain. The dialysate contains a type of sugar known as dextrose. Dextrose has a lot of calories. The body takes in several hundred calories from this sugar each day.   Weakened muscles in the abdomen. This can result from all of the fluid that your body has to hold in the abdomen.   Catheter replacement. Sometimes, a new one has to be put in.   Change in dialysis method. Due to some complications, you may need to change to hemodialysis for a short time and have your dialysis done at a center.   Trouble adjusting to your new lifestyle. In some people, this leads to depression.   Sleep problems.   Dialysis-related amyloidosis. This sometimes occurs after 5 years of dialysis. Protein builds up in the blood. This can cause painful deposits on bones, joints and tendons (which connect muscle to bone). Or, it can cause hollow spots in bones that make them more likely to break.   Excess fluid. Your body may absorb too much of the fluid that is held in the abdomen. This can lead to heart or lung problems.  SEEK MEDICAL CARE IF:   You have any problems with an exchange.   The area around the catheter becomes red or painful.   The catheter seems loose, or it feels like it is coming out.   A bag of dialysate looks cloudy. Or, the liquid is an unusual color.   Abdominal pain or discomfort.   You feel sick to your stomach (nauseous) or throw up (vomit).    You develop a fever of more than 102 F (38.9 C).  SEEK IMMEDIATE MEDICAL CARE IF:  You develop a fever of more than 102 F (38.9 C). Document Released: 12/14/2008 Document Revised: 02/05/2011 Document Reviewed: 12/14/2008 [facility] Patient Information 2012 [place].  Diet for Peritoneal Dialysis This diet may be modified in protein, sodium, phosphorus, potassium, or fluid, depending on your needs. The goals of nutrition therapy are similar to those for patients on hemodialysis. Providing enough protein to replace peritoneal losses is a priority. USES OF THIS DIET The diet is designed for the patient with end-stage kidney (renal) disease, who is treated by peritoneal dialysis. Treatment options include:  Continuous Ambulatory Peritoneal Dialysis (CAPD): Usually 4 exchanges of 1.5 to 2 liter volumes of glucose (sugar) and electrolyte-containing dialysate.   Continuous Cyclic Peritoneal Dialysis (CCPD): Essentially a reversal of CAPD, with shorter exchanges at night and a longer one during the day.   Intermittent Peritoneal Dialysis (IPD): 10 to  12 hours of exchanges, 2 to 3 times weekly.  ADEQUACY The diet may not meet the Recommended Dietary Allowances of the [employer] for calcium and ascorbic acid. Protein and water-soluble vitamin needs may be increased because of losses into the dialysate. Recommended daily supplements are the same as for hemodialysis patients. ASSESSMENT/DETERMINATION OF DIET Dietary needs will differ between patients. Parameters must be individualized. Protein  Guidelines: 1.2 to 1.3 gm/kg/day OR 1.5 gm/kg/day if patient is malnourished, catabolic, or has a protracted episode of peritonitis. A minimum of 50% of the protein intake should be of high biological value.   Goals: Meet protein requirements and replace dialysate losses while avoiding excessive accumulation of waste products. Achieve serum albumin greater than 3.5 g/dL.   Evaluate:  Current nutritional status, serum albumin and BUN levels, presence of peritonitis.  Sodium  Guidelines: Usually 90 to 175 mEq (2000 to 4000 mg), but should be individualized.   Goals: Minimize complications of fluid imbalance.   Evaluate: Weight, blood pressure regulation, and presence of swelling (edema).  Potassium  Guidelines: Individualized; often not restricted, and may need to be supplemented.   Goals: Serum K+ levels between 4.0 to 5.0 mEq/L.   Evaluate: Serum K+ levels, usual intake of K+, appetite.  Phosphorus  Guidelines: 800 to 1200 mg/day (the high protein intake results in a high obligatory P intake).   Goal: Serum P levels between 4.5 to 6.0 mg/dL.   Evaluate: Serum P levels, usual P intake, P-binding medications: type, number, dosage, distribution.  Fluids  Guidelines: Individualized - may not be restricted for all patients.   Goal: Minimize complications of fluid imbalance.   Evaluate: Weight, blood pressure regulation, sodium intake, and presence of edema.  Document Released: 02/16/2005 Document Revised: 02/05/2011 Document Reviewed: 05/11/2006 [facility] Patient Information 2012 [place].   What to eat:  For your first meals, you should eat lightly; only small meals initially.  If you do not have nausea, you may eat larger meals.  Avoid spicy, greasy and heavy food.    General Anesthesia, Adult, Care After  Refer to this sheet in the next few weeks. These instructions provide you with information on caring for yourself after your procedure. Your health care provider may also give you more specific instructions. Your treatment has been planned according to current medical practices, but problems sometimes occur. Call your health care provider if you have any problems or questions after your procedure.  WHAT TO EXPECT AFTER THE PROCEDURE  After the procedure, it is typical to experience:  Sleepiness.  Nausea and vomiting. HOME CARE INSTRUCTIONS  For  the first 24 hours after general anesthesia:  Have a responsible person with you.  Do not drive a car. If you are alone, do not take public transportation.  Do not drink alcohol.  Do not take medicine that has not been prescribed by your health care provider.  Do not sign important papers or make important decisions.  You may resume a normal diet and activities as directed by your health care provider.  Change bandages (dressings) as directed.  If you have questions or problems that seem related to general anesthesia, call the hospital and ask for the anesthetist or anesthesiologist on call. SEEK MEDICAL CARE IF:  You have nausea and vomiting that continue the day after anesthesia.  You develop a rash. SEEK IMMEDIATE MEDICAL CARE IF:  You have difficulty breathing.  You have chest pain.  You have any allergic problems. Document Released: 05/25/2000 Document Revised: 10/19/2012  Document Reviewed: 09/01/2012  [facility] Patient Information 2014 [place].

## 2013-03-06 NOTE — Op Note (Signed)
Preoperative Diagnosis: end stage renal diseaase and umbilical hernia   Postoprative Diagnosis: end stage renal diseaase and umbilical hernia   Procedure: Procedure(s): LAPAROSCOPIC INSERTION CONTINUOUS AMBULATORY PERITONEAL DIALYSIS  (CAPD) CATHETER HERNIA REPAIR UMBILICAL WITH MESH   Surgeon: Excell Seltzer T   Assistants: none  Anesthesia:  General endotracheal anesthesia  Indications: patient is a 80 year old male with impending need for dialysis who desires peritoneal dialysis and has been cleared by his nephrologist is an acceptable candidate. He also has a moderate approximately [id number] cm umbilical hernia. After  Preoperative evaluation and discussion detailed elsewhere we elect to proceed with laparoscopic placement of a peritoneal dialysis catheter with concomitant repair of his umbilical hernia with mesh. The procedure and risks have been discussed in detailed elsewhere.  Procedure Detail:  Patient was brought to the operating room, placed in the supine position on the operating table, and general endotracheal anesthesia induced. The patient received preoperative IV antibiotics. The abdomen was widely sterilely prepped and draped  Inpatient timeout performed and correct procedure verified. Access was obtained without difficulty with a 5 mm Optiview trocar in the left upper quadrant and pneumoperitoneum established. There was no evidence of trocar injury. There was noted to be in approximately [id number] cm umbilical hernia. There was an omental adhesion approximately 5 cm superior to the hernia in the midline which was essentially performing an effective omentopexy keeping the omentum up out of the pelvis and I left this adhesion in place. Under direct vision a 12 mm trocar was placed several centimeters to the right of the umbilicus. A right-sided [place] peritoneal dialysis catheter was chosen and this was introduced through the 12 mm trocar oriented toward the pelvis. Through a second 5  mm site in the left midabdomen the catheter was positioned with the Silastic bulge just inside the peritoneum and the 12 mm trocar gradually withdrawn. The catheter was then tunneled subcutaneously to an exit site about 5 cm beneath the insertion site and brought out. Attention was then turned to the local hernia. The abdomen was desufflated. I made a curvilinear incision just beneath the umbilicus and dissection was carried down into the subtenons tissue. The umbilical skin was dissected up off the hernia sac and the peritoneal cavity entered and the hernia sac completely excised. The fascial edges were defined in all directions and the short skin the saphenous flaps were raised all fascial edges in all directions to allow closure. An 8 cm Ventralex umbilical hernia patch was used it was placed intraperitoneally and oriented and the tags brought up through the umbilical defect. The fascia was then closed transversely with interrupted 0 Prolene with 3 sutures centrally incorporating the tags which were then trimmed away. The abdomen was then reinsufflated and the mesh was seen to be nicely flatly deployed against the anterior abdominal wall in all directions with nice wide coverage. I then attempted to urinate the peritoneal dialysis catheter but there appeared to be obstruction despite the intraperitoneal portion appearing completely normal and in good position. I explored the subcutaneous tract and there was a kink in the catheter. The external portion was withdrawn back into the incision and re\re tunneled back out through the same exit site and at this point the catheter was widely patent and flushed and aspirated saline without difficulty. All CO2 was evacuated and trochars removed. The umbilical incision was closed with interrupted subcutaneous 4-0 Monocryl in running subcuticular 4-0 Monocryl and the laparoscopic incisions closed with subcuticular 4-0 Monocryl in all incisions closed with Dermabond.  Sponge  needle and instrument counts were correct.    Findings: Approximately 2 cm umbilical hernia  Estimated Blood Loss:  Minimal         Drains: none  Blood Given: none          Specimens: none        Complications:  * No complications entered in OR log *         Disposition: PACU - hemodynamically stable.         Condition: stable

## 2013-03-06 NOTE — Interval H&P Note (Signed)
History and Physical Interval Note:  03/06/2013 7:28 AM  Sean Best  has presented today for surgery, with the diagnosis of end stage renal diseaase and umbilical hernia   The various methods of treatment have been discussed with the patient and family. After consideration of risks, benefits and other options for treatment, the patient has consented to  Procedure(s): [name]  (CAPD) CATHETER (N/A) HERNIA REPAIR UMBILICAL WITH MESH (N/A) INSERTION OF MESH (N/A) as a surgical intervention .  The patient's history has been reviewed, patient examined, no change in status, stable for surgery.  I have reviewed the patient's chart and labs.  Questions were answered to the patient's satisfaction.     Arva Slaugh T

## 2013-03-06 NOTE — Anesthesia Preprocedure Evaluation (Addendum)
Anesthesia Evaluation  Patient identified by MRN, date of birth, ID band Patient awake    Reviewed: Allergy & Precautions, H&P , NPO status , Patient's Chart, lab work & pertinent test results  Airway Mallampati: II TM Distance: >3 FB Neck ROM: Full    Dental  (+) Edentulous Upper and Edentulous Lower   Pulmonary  breath sounds clear to auscultation        Cardiovascular hypertension, + Peripheral Vascular Disease Rhythm:Regular Rate:Normal     Neuro/Psych CVA    GI/Hepatic   Endo/Other  diabetes, Well Controlled, Type 2, Oral Hypoglycemic Agents  Renal/GU CRFRenal disease     Musculoskeletal   Abdominal (+) + obese,   Peds  Hematology   Anesthesia Other Findings   Reproductive/Obstetrics                         Anesthesia Physical Anesthesia Plan  ASA: III  Anesthesia Plan: General   Post-op Pain Management:    Induction: Intravenous  Airway Management Planned: Oral ETT  Additional Equipment:   Intra-op Plan:   Post-operative Plan: Extubation in OR  Informed Consent: I have reviewed the patients History and Physical, chart, labs and discussed the procedure including the risks, benefits and alternatives for the proposed anesthesia with the patient or authorized representative who has indicated his/her understanding and acceptance.     Plan Discussed with: CRNA and Surgeon  Anesthesia Plan Comments:         Anesthesia Quick Evaluation

## 2013-03-08 ENCOUNTER — Encounter (HOSPITAL_COMMUNITY): Payer: Self-pay | Admitting: General Surgery

## 2013-03-08 ENCOUNTER — Telehealth (INDEPENDENT_AMBULATORY_CARE_PROVIDER_SITE_OTHER): Payer: Self-pay

## 2013-03-08 NOTE — Telephone Encounter (Signed)
Pts daughter called stating pt is up at lib and doing well. She states PD cath site still covered and doing well. She called [id number] wd that has drained a small amt of dark red blood. No redness. No fever. No odor. Not bright red. She will gently clean area and apply dry dsg. I advised her to watch area closely and call if drainage increases,becomes bright red,skin becomes red or drainage appears to be infected. She states she understands.

## 2013-03-11 ENCOUNTER — Emergency Department (HOSPITAL_COMMUNITY): Payer: Medicare Other

## 2013-03-11 ENCOUNTER — Encounter (HOSPITAL_COMMUNITY): Payer: Self-pay | Admitting: Emergency Medicine

## 2013-03-11 ENCOUNTER — Inpatient Hospital Stay (HOSPITAL_COMMUNITY)
Admission: EM | Admit: 2013-03-11 | Discharge: 2013-03-18 | DRG: 682 | Disposition: A | Discharge status: Home / Self Care | Payer: Medicare Other | Attending: Internal Medicine | Admitting: Internal Medicine

## 2013-03-11 DIAGNOSIS — N058 Unspecified nephritic syndrome with other morphologic changes: Secondary | ICD-10-CM

## 2013-03-11 DIAGNOSIS — I10 Essential (primary) hypertension: Secondary | ICD-10-CM | POA: Diagnosis present

## 2013-03-11 DIAGNOSIS — F0390 Unspecified dementia without behavioral disturbance: Secondary | ICD-10-CM | POA: Diagnosis present

## 2013-03-11 DIAGNOSIS — R4182 Altered mental status, unspecified: Secondary | ICD-10-CM | POA: Diagnosis present

## 2013-03-11 DIAGNOSIS — Z79899 Other long term (current) drug therapy: Secondary | ICD-10-CM

## 2013-03-11 DIAGNOSIS — N186 End stage renal disease: Secondary | ICD-10-CM

## 2013-03-11 DIAGNOSIS — K429 Umbilical hernia without obstruction or gangrene: Secondary | ICD-10-CM

## 2013-03-11 DIAGNOSIS — G9340 Encephalopathy, unspecified: Secondary | ICD-10-CM | POA: Diagnosis present

## 2013-03-11 DIAGNOSIS — Y849 Medical procedure, unspecified as the cause of abnormal reaction of the patient, or of later complication, without mention of misadventure at the time of the procedure: Secondary | ICD-10-CM | POA: Diagnosis present

## 2013-03-11 DIAGNOSIS — Z9889 Other specified postprocedural states: Secondary | ICD-10-CM

## 2013-03-11 DIAGNOSIS — Z8546 Personal history of malignant neoplasm of prostate: Secondary | ICD-10-CM

## 2013-03-11 DIAGNOSIS — Z8249 Family history of ischemic heart disease and other diseases of the circulatory system: Secondary | ICD-10-CM

## 2013-03-11 DIAGNOSIS — G253 Myoclonus: Secondary | ICD-10-CM | POA: Diagnosis present

## 2013-03-11 DIAGNOSIS — G9349 Other encephalopathy: Secondary | ICD-10-CM | POA: Diagnosis present

## 2013-03-11 DIAGNOSIS — S301XXA Contusion of abdominal wall, initial encounter: Secondary | ICD-10-CM

## 2013-03-11 DIAGNOSIS — D649 Anemia, unspecified: Secondary | ICD-10-CM | POA: Diagnosis present

## 2013-03-11 DIAGNOSIS — N039 Chronic nephritic syndrome with unspecified morphologic changes: Secondary | ICD-10-CM

## 2013-03-11 DIAGNOSIS — E1129 Type 2 diabetes mellitus with other diabetic kidney complication: Secondary | ICD-10-CM | POA: Diagnosis present

## 2013-03-11 DIAGNOSIS — E875 Hyperkalemia: Secondary | ICD-10-CM | POA: Diagnosis present

## 2013-03-11 DIAGNOSIS — N320 Bladder-neck obstruction: Secondary | ICD-10-CM | POA: Diagnosis present

## 2013-03-11 DIAGNOSIS — Z8673 Personal history of transient ischemic attack (TIA), and cerebral infarction without residual deficits: Secondary | ICD-10-CM

## 2013-03-11 DIAGNOSIS — N185 Chronic kidney disease, stage 5: Secondary | ICD-10-CM | POA: Diagnosis present

## 2013-03-11 DIAGNOSIS — M109 Gout, unspecified: Secondary | ICD-10-CM | POA: Diagnosis present

## 2013-03-11 DIAGNOSIS — D631 Anemia in chronic kidney disease: Secondary | ICD-10-CM | POA: Diagnosis present

## 2013-03-11 DIAGNOSIS — Z7982 Long term (current) use of aspirin: Secondary | ICD-10-CM

## 2013-03-11 DIAGNOSIS — I120 Hypertensive chronic kidney disease with stage 5 chronic kidney disease or end stage renal disease: Principal | ICD-10-CM | POA: Diagnosis present

## 2013-03-11 DIAGNOSIS — E785 Hyperlipidemia, unspecified: Secondary | ICD-10-CM | POA: Diagnosis present

## 2013-03-11 DIAGNOSIS — D62 Acute posthemorrhagic anemia: Secondary | ICD-10-CM | POA: Diagnosis present

## 2013-03-11 DIAGNOSIS — IMO0002 Reserved for concepts with insufficient information to code with codable children: Secondary | ICD-10-CM | POA: Diagnosis present

## 2013-03-11 DIAGNOSIS — S3011XA Contusion of abdominal wall, initial encounter: Secondary | ICD-10-CM

## 2013-03-11 DIAGNOSIS — E669 Obesity, unspecified: Secondary | ICD-10-CM | POA: Diagnosis present

## 2013-03-11 HISTORY — DX: Umbilical hernia without obstruction or gangrene: K42.9

## 2013-03-11 LAB — CK: Total CK: 278 U/L — ABNORMAL HIGH (ref 7–232)

## 2013-03-11 LAB — CBC WITH DIFFERENTIAL/PLATELET
Basophils Absolute: 0 10*3/uL (ref 0.0–0.1)
Basophils Relative: 0 % (ref 0–1)
EOS ABS: 0.3 10*3/uL (ref 0.0–0.7)
EOS PCT: 5 % (ref 0–5)
HEMATOCRIT: 30 % — AB (ref 39.0–52.0)
HEMOGLOBIN: 9.6 g/dL — AB (ref 13.0–17.0)
LYMPHS ABS: 1 10*3/uL (ref 0.7–4.0)
Lymphocytes Relative: 15 % (ref 12–46)
MCH: 30.1 pg (ref 26.0–34.0)
MCHC: 32 g/dL (ref 30.0–36.0)
MCV: 94 fL (ref 78.0–100.0)
MONO ABS: 1.2 10*3/uL — AB (ref 0.1–1.0)
Monocytes Relative: 18 % — ABNORMAL HIGH (ref 3–12)
Neutro Abs: 4.3 10*3/uL (ref 1.7–7.7)
Neutrophils Relative %: 63 % (ref 43–77)
Platelets: 218 10*3/uL (ref 150–400)
RBC: 3.19 MIL/uL — AB (ref 4.22–5.81)
RDW: 15.8 % — ABNORMAL HIGH (ref 11.5–15.5)
WBC: 6.8 10*3/uL (ref 4.0–10.5)

## 2013-03-11 LAB — PHOSPHORUS: Phosphorus: 7.8 mg/dL — ABNORMAL HIGH (ref 2.3–4.6)

## 2013-03-11 LAB — URINALYSIS, ROUTINE W REFLEX MICROSCOPIC
Bilirubin Urine: NEGATIVE
Glucose, UA: NEGATIVE mg/dL
Ketones, ur: NEGATIVE mg/dL
LEUKOCYTES UA: NEGATIVE
NITRITE: NEGATIVE
PH: 5 (ref 5.0–8.0)
PROTEIN: 100 mg/dL — AB
Specific Gravity, Urine: 1.016 (ref 1.005–1.030)
Urobilinogen, UA: 0.2 mg/dL (ref 0.0–1.0)

## 2013-03-11 LAB — GLUCOSE, CAPILLARY
GLUCOSE-CAPILLARY: 92 mg/dL (ref 70–99)
Glucose-Capillary: 93 mg/dL (ref 70–99)

## 2013-03-11 LAB — COMPREHENSIVE METABOLIC PANEL
ALT: 5 U/L (ref 0–53)
AST: 11 U/L (ref 0–37)
Albumin: 3.5 g/dL (ref 3.5–5.2)
Alkaline Phosphatase: 49 U/L (ref 39–117)
BUN: 98 mg/dL — ABNORMAL HIGH (ref 6–23)
CALCIUM: 9.4 mg/dL (ref 8.4–10.5)
CO2: 17 mEq/L — ABNORMAL LOW (ref 19–32)
Chloride: 103 mEq/L (ref 96–112)
Creatinine, Ser: 12.78 mg/dL — ABNORMAL HIGH (ref 0.50–1.35)
GFR calc non Af Amer: 3 mL/min — ABNORMAL LOW (ref >90)
GFR, EST AFRICAN AMERICAN: 4 mL/min — AB (ref >90)
Glucose, Bld: 90 mg/dL (ref 70–99)
Potassium: 6 mEq/L — ABNORMAL HIGH (ref 3.7–5.3)
Sodium: 140 mEq/L (ref 137–147)
TOTAL PROTEIN: 7 g/dL (ref 6.0–8.3)
Total Bilirubin: 0.3 mg/dL (ref 0.3–1.2)

## 2013-03-11 LAB — PROTIME-INR
INR: 1.31 (ref 0.00–1.49)
PROTHROMBIN TIME: 16 s — AB (ref 11.6–15.2)

## 2013-03-11 LAB — CG4 I-STAT (LACTIC ACID): LACTIC ACID, VENOUS: 1.06 mmol/L (ref 0.5–2.2)

## 2013-03-11 LAB — HEPATITIS B SURFACE ANTIBODY,QUALITATIVE: Hep B S Ab: NEGATIVE

## 2013-03-11 LAB — MAGNESIUM: Magnesium: 2.1 mg/dL (ref 1.5–2.5)

## 2013-03-11 LAB — HEPATITIS B CORE ANTIBODY, TOTAL: Hep B Core Total Ab: NONREACTIVE

## 2013-03-11 LAB — URINE MICROSCOPIC-ADD ON

## 2013-03-11 LAB — HEPATITIS B SURFACE ANTIGEN: Hepatitis B Surface Ag: NEGATIVE

## 2013-03-11 LAB — APTT: APTT: 41 s — AB (ref 24–37)

## 2013-03-11 LAB — AMMONIA: AMMONIA: 31 umol/L (ref 11–60)

## 2013-03-11 MED ORDER — SODIUM POLYSTYRENE SULFONATE 15 GM/60ML PO SUSP
30.0000 g | Freq: Once | ORAL | Status: AC
  Administered 2013-03-11: 30 g via ORAL
  Filled 2013-03-11: qty 120

## 2013-03-11 MED ORDER — ONDANSETRON HCL 4 MG/2ML IJ SOLN: 4.0000 mg | Freq: Four times a day (QID) | INTRAMUSCULAR | Status: DC | PRN

## 2013-03-11 MED ORDER — MORPHINE SULFATE 2 MG/ML IJ SOLN: 1.0000 mg | INTRAMUSCULAR | Status: DC | PRN

## 2013-03-11 MED ORDER — MORPHINE SULFATE 2 MG/ML IJ SOLN
2.0000 mg | Freq: Once | INTRAMUSCULAR | Status: AC
  Administered 2013-03-11: 2 mg via INTRAVENOUS
  Filled 2013-03-11: qty 1

## 2013-03-11 MED ORDER — ALLOPURINOL 100 MG PO TABS
100.0000 mg | ORAL_TABLET | Freq: Every day | ORAL | Status: DC
  Administered 2013-03-12 – 2013-03-18 (×7): 100 mg via ORAL
  Filled 2013-03-11 (×7): qty 1

## 2013-03-11 MED ORDER — FUROSEMIDE 10 MG/ML IJ SOLN
40.0000 mg | Freq: Once | INTRAMUSCULAR | Status: AC
  Administered 2013-03-11: 40 mg via INTRAVENOUS
  Filled 2013-03-11: qty 4

## 2013-03-11 MED ORDER — METOPROLOL TARTRATE 25 MG PO TABS
25.0000 mg | ORAL_TABLET | Freq: Two times a day (BID) | ORAL | Status: DC
  Administered 2013-03-12 (×2): 25 mg via ORAL
  Filled 2013-03-11 (×4): qty 1

## 2013-03-11 MED ORDER — ACETAMINOPHEN 325 MG PO TABS
650.0000 mg | ORAL_TABLET | Freq: Four times a day (QID) | ORAL | Status: DC | PRN
  Administered 2013-03-14 – 2013-03-16 (×3): 650 mg via ORAL
  Filled 2013-03-11 (×3): qty 2

## 2013-03-11 MED ORDER — ONDANSETRON HCL 4 MG PO TABS: 4.0000 mg | ORAL_TABLET | Freq: Four times a day (QID) | ORAL | Status: DC | PRN

## 2013-03-11 MED ORDER — SENNOSIDES-DOCUSATE SODIUM 8.6-50 MG PO TABS
1.0000 | ORAL_TABLET | Freq: Every evening | ORAL | Status: DC | PRN
  Filled 2013-03-11: qty 1

## 2013-03-11 MED ORDER — ACETAMINOPHEN 650 MG RE SUPP: 650.0000 mg | Freq: Four times a day (QID) | RECTAL | Status: DC | PRN

## 2013-03-11 MED ORDER — NALOXONE HCL 0.4 MG/ML IJ SOLN
0.4000 mg | Freq: Once | INTRAMUSCULAR | Status: AC
  Administered 2013-03-11: 0.4 mg via INTRAVENOUS
  Filled 2013-03-11: qty 1

## 2013-03-11 MED ORDER — FUROSEMIDE 80 MG PO TABS
80.0000 mg | ORAL_TABLET | Freq: Two times a day (BID) | ORAL | Status: DC | PRN
  Filled 2013-03-11: qty 1

## 2013-03-11 MED ORDER — CALCITRIOL 0.25 MCG PO CAPS
0.2500 ug | ORAL_CAPSULE | Freq: Every day | ORAL | Status: DC
  Administered 2013-03-12 – 2013-03-18 (×7): 0.25 ug via ORAL
  Filled 2013-03-11 (×7): qty 1

## 2013-03-11 NOTE — H&P (Addendum)
[facility] History and Physical  Mithran Whitson [id number] DOB: 1933-11-02 DOA: 03/11/2013  Referring physician: *EDP PCP: [name], MD   Chief Complaint: Confused  HPI: Sean Best is a 80 y.o. male  Noted to be confused and weak today. Family called EMS. All history per chart and daughter. Patient had umbilical hernia repair and placement of a peritoneal dialysis catheter by Dr. Excell Seltzer on 03/06/2013. His daughter reports that several days ago he had a syncopal episode and was noted to be hypoglycemic. He has a history of diabetes but is not currently on medications. His appetite has been less than usual, but he is eating. Family reports some bloody drainage from the incision, as well as ecchymoses. Dr. Excell Seltzer has consulted and felt that the wound and hematoma is unrelated to patient's altered mental status. Patient has a chest x-ray which shows no infiltrate, white blood cell count normal, urinalysis shows no sign of infection. He has had no reported fevers chills cough vomiting or diarrhea. Patient's daughter gave him 2 Percocets last night, but he had been taking the pain medication without much difficulty prior. Today, his potassium is noted to be 6. BUN is 98. Creatinine 12.7. On the fifth, BUN was about 70 and creatinine about 9. Venous lactic acid normal. EKG shows no acute changes. Patient reportedly lives at home and cares for his elderly wife, and is usually quite independent. Today patient will rouse briefly but quickly fall back asleep  Review of Systems:  Unable due to patient factors  Past Medical History  Diagnosis Date  . Stroke   . Claudication   . Diabetes mellitus   . Hypertension   . Hyperlipidemia   . Cancer     prostate cancer  . Gout   . Overweight(278.02)   . Arthritis   . Chronic total occlusion of artery of the extremities     pt not aware of this  . Chronic kidney disease     End stage renal disease  . Umbilical hernia   . Anemia     Past Surgical History  Procedure Laterality Date  . Prostate surgery      prostate seed placement  . Av fistula placement, radiocephalic  [id number]    Left arm  . Eye surgery Left     cataract surgery  . Eye surgery Left     for eye injury  . Capd insertion N/A 03/06/2013    Procedure: LAPAROSCOPIC INSERTION CONTINUOUS AMBULATORY PERITONEAL DIALYSIS  (CAPD) CATHETER;  Surgeon: Edward Jolly, MD;  Location: [place];  Service: General;  Laterality: N/A;  . Umbilical hernia repair N/A 03/06/2013    Procedure: HERNIA REPAIR UMBILICAL WITH MESH;  Surgeon: Edward Jolly, MD;  Location: [place];  Service: General;  Laterality: N/A;   Social History:  reports that he has never smoked. He has never used smokeless tobacco. He reports that he does not drink alcohol or use illicit drugs.  Allergies  Allergen Reactions  . Cardura [Doxazosin Mesylate]     Hallucinations    Family History  Problem Relation Age of Onset  . Cancer Mother   . Heart disease Mother   . Cancer Father      Prior to Admission medications   Medication Sig Start Date End Date Taking? Authorizing Provider  allopurinol (ZYLOPRIM) 100 MG tablet Take 100 mg by mouth daily.     Yes Historical Provider, MD  atorvastatin (LIPITOR) 40 MG tablet Take 40 mg by mouth daily.  Yes Historical Provider, MD  calcitRIOL (ROCALTROL) 0.25 MCG capsule Take 0.25 mcg by mouth daily.     Yes Historical Provider, MD  furosemide (LASIX) 80 MG tablet Take 80 mg by mouth 2 (two) times daily as needed for fluid.    Yes Historical Provider, MD  hydrALAZINE (APRESOLINE) 50 MG tablet Take 50 mg by mouth 2 (two) times daily.   Yes Historical Provider, MD  losartan (COZAAR) 100 MG tablet Take 100 mg by mouth daily.     Yes Historical Provider, MD  metoprolol (LOPRESSOR) 100 MG tablet Take 100 mg by mouth 2 (two) times daily.   Yes Historical Provider, MD  oxyCODONE-acetaminophen (ROXICET) 5-325 MG per tablet Take 1-2 tablets by mouth every  4 (four) hours as needed for severe pain. 03/06/13  Yes Edward Jolly, MD  ACCU-CHEK COMPACT STRIPS test strip  05/20/10   Historical Provider, MD  aspirin 325 MG EC tablet Take 325 mg by mouth daily.    Historical Provider, MD   Physical Exam: Filed Vitals:   03/11/13 1030  BP: 132/61  Pulse: 99  Temp:   Resp:     BP 132/61  Pulse 99  Temp(Src) 99.2 F (37.3 C) (Rectal)  Resp 16  SpO2 95% BP 132/61  Pulse 99  Temp(Src) 99.2 F (37.3 C) (Rectal)  Resp 16  SpO2 95%  General Appearance:    somnolent. Opens eyes to voice but doesn't answer questions. Can follow a few simple commands but quickly falls back asleep.   Head:    Normocephalic, without obvious abnormality, atraumatic  Eyes:    PERRL, conjunctiva/corneas clear, EOM's intact, fundi    benign, both eyes       Ears:    Normal TM's and external ear canals, both ears  Nose:   Nares normal, septum midline, mucosa normal, no drainage   or sinus tenderness  Throat:   dry mucous membranes   Neck:   Supple, symmetrical, trachea midline, no adenopathy;       thyroid:  No enlargement/tenderness/nodules; no carotid   bruit or JVD  Back:     Symmetric, no curvature, ROM normal, no CVA tenderness  Lungs:     Clear to auscultation bilaterally, respirations unlabored  Chest wall:    No tenderness or deformity  Heart:    Regular rate and rhythm, S1 and S2 normal, no murmur, rub   or gallop  Abdomen:     peritoneal dialysis catheter noted. Significant ecchymoses of the lower abdomen. No drainage currently from incision.   Genitalia:   deferred   Rectal:   deferred   Extremities:   Extremities normal, atraumatic, no cyanosis or 2 to 3+ edema in the legs and feet. palpable thrill, left forearm   Pulses:   2+ and symmetric all extremities  Skin:   see above   Lymph nodes:   Cervical, supraclavicular, and axillary nodes normal  Neurologic:   somnolent. Arousable. Withdraws to painful stimulus in no obvious cranial nerves or motor  deficits.           Labs on Admission:  Basic Metabolic Panel:  Recent Labs Lab 03/06/13 0559 03/11/13 0911  NA 140 140  K 4.8 6.0*  CL 105 103  CO2 16* 17*  GLUCOSE 106* 90  BUN 72* 98*  CREATININE 9.19* 12.78*  CALCIUM 9.6 9.4   Liver Function Tests:  Recent Labs Lab 03/11/13 0911  AST 11  ALT 5  ALKPHOS 49  BILITOT 0.3  PROT 7.0  ALBUMIN 3.5   No results found for this basename: LIPASE, AMYLASE,  in the last 168 hours No results found for this basename: AMMONIA,  in the last 168 hours CBC:  Recent Labs Lab 03/06/13 0559 03/11/13 0911  WBC 7.0 6.8  NEUTROABS  --  4.3  HGB 10.1* 9.6*  HCT 32.3* 30.0*  MCV 94.4 94.0  PLT 234 218   Cardiac Enzymes: No results found for this basename: CKTOTAL, CKMB, CKMBINDEX, TROPONINI,  in the last 168 hours  BNP (last 3 results) No results found for this basename: PROBNP,  in the last 8760 hours CBG:  Recent Labs Lab 03/06/13 0921 03/11/13 0917  GLUCAP 103* 92    Radiological Exams on Admission: Ct Head Wo Contrast  03/11/2013   CLINICAL DATA:  Mental status changes.  EXAM: CT HEAD WITHOUT CONTRAST  TECHNIQUE: Contiguous axial images were obtained from the base of the skull through the vertex without intravenous contrast.  COMPARISON:  None.  FINDINGS: Mild age related cerebral atrophy, ventriculomegaly and periventricular white matter disease. No extra-axial fluid collections are identified. No CT findings for acute hemispheric infarction an or intracranial hemorrhage. No mass lesions. The brainstem and cerebellum are grossly normal. Moderate vascular calcifications are noted. The globes are intact. Scleral banding is noted on the left. No lens is identified. A small scalp cyst is noted at the occiput.  The bony structures are intact. No skull fracture or bone lesion. The paranasal sinuses and mastoid air cells are clear.  IMPRESSION: Mild age related cerebral atrophy, ventriculomegaly and periventricular white matter  disease. No acute intracranial findings or mass lesions.   Electronically Signed   By: Kalman Jewels M.D.   On: 03/11/2013 10:43   Dg Chest Port 1 View  03/11/2013   CLINICAL DATA:  Syncope, altered mental status  EXAM: PORTABLE CHEST - 1 VIEW  COMPARISON:  03/01/2013  FINDINGS: Cardiomegaly. No acute infiltrate or pulmonary edema. Mild basilar atelectasis.  IMPRESSION: Cardiomegaly. No acute infiltrate or pulmonary edema. Mild basilar atelectasis.   Electronically Signed   By: Lahoma Crocker M.D.   On: 03/11/2013 09:21    EKG: Sinus tachycardia Probable left atrial enlargement Borderline left axis deviation  Assessment/Plan    Encephalopathy, Likely multifactorial: Pain medication, uremia. No infection found. Admit to step down unit. Will also check ammonia level.  Chronic kidney disease stage V, likely now end-stage. Patient has an AV fistula placed 2 years ago which has never been used. Had a recent umbilical hernia repair and placement of peritoneal dialysis catheter and umbilical hernia repair. See below    Hyperkalemia: No acute T-wave changes. ED physician has ordered Lasix only. Will also order Kayexalate.  Have consulted Dr. Mercy Moore. May require dialysis.    Hematoma of abdominal wall: Has been seen by Dr. Excell Seltzer who sees no sign of infection. See his note. He will follow along. Sequential compression devices only for now. Hold aspirin.    Anemia, acute on chronic, at least in part due to blood loss. Monitor.    DM (diabetes mellitus), type 2 with renal complications, diet controlled: Daughter reports patient has had a hypoglycemic episode a few days ago. Monitor blood glucoses.    Benign hypertension: Will resume metoprolol at lower dose, but hold ARB and hydralazine for now.    Hyperlipidemia: Hold statin. Check CPK.    Umbilical hernia  [facility] to follow tomorrow.  Code Status: full Family Communication: daughter at bedside Disposition Plan:  home  Time spent:  77 min  [facility] Pager [phone]

## 2013-03-11 NOTE — Procedures (Signed)
Pt seen on HD.  Ap 100 Vp 90  BFR 200.  Dialysis just getting started.  SBP 158

## 2013-03-11 NOTE — ED Provider Notes (Signed)
Medical screening examination/treatment/procedure(s) were conducted as a shared visit with non-physician practitioner(s) and myself.  I personally evaluated the patient during the encounter.  EKG Interpretation    Date/Time:  Saturday March 11 2013 08:17:16 EST Ventricular Rate:  104 PR Interval:  155 QRS Duration: 87 QT Interval:  332 QTC Calculation: 437 R Axis:   -20 Text Interpretation:  Sinus tachycardia Probable left atrial enlargement Borderline left axis deviation Confirmed by Christy Gentles  MD, Alick Lecomte [phone]) on 03/11/2013 8:21:37 AM              Sharyon Cable, MD 03/11/13 [phone]

## 2013-03-11 NOTE — ED Notes (Signed)
Dr. Conley Canal at Bedside

## 2013-03-11 NOTE — ED Provider Notes (Signed)
Patient seen/examined in the Emergency Department in conjunction with Midlevel Provider [place] Patient reports abd pain and drainage from surgical site.  Daughter reports he is more confused and she reports increased redness around surgical site Exam : tenderness to surgical site with erythema/warmth Plan: consult surgery for further guidance    Sharyon Cable, MD 03/11/13 [phone]

## 2013-03-11 NOTE — ED Notes (Signed)
Pt arrived by [facility] from home. Pt had hernia repair on Monday having wound drainage, swelling, bruising. Family reported pt not acting himself this am, near syncope and generalized weakness.

## 2013-03-11 NOTE — ED Provider Notes (Signed)
CSN: [id number]     Arrival date & time 03/11/13  0806 History   First MD Initiated Contact with Patient 03/11/13 0818     Chief Complaint  Patient presents with  . Near Syncope  . Wound Check   (Consider location/radiation/quality/duration/timing/severity/associated sxs/prior Treatment) HPI Comments: Patient is a 80 year old male past medical history significant for DM, HTN, HLT, history of prostate cancer, gout, ESRD with recent peritoneal dialysis catheter placement presenting to the emergency department from a nursing home for increased generalized weakness, decreased level of consciousness this AM, with near syncopal episode per family. The patient recently had an umbilical hernia repair with mesh placement along with peritoneal dialysis catheter placement on Monday performed by Dr. Abran Cantor without complication. According to the daughter the patient is normally very independent, able to complete activities of daily living without assistance. Patient is followed by Dr. Baird Cancer. Patient does make his own urine. He has not started dialysis yet. Patient is a level V caveat d/t dementia.   Patient is a 80 y.o. male presenting with near-syncope and wound check. The history is provided by the patient, a relative, the EMS personnel and medical records.  Near Syncope  Wound Check    Past Medical History  Diagnosis Date  . Stroke   . Claudication   . Diabetes mellitus   . Hypertension   . Hyperlipidemia   . Cancer     prostate cancer  . Gout   . Overweight(278.02)   . Arthritis   . Chronic total occlusion of artery of the extremities     pt not aware of this  . Chronic kidney disease     End stage renal disease  . Umbilical hernia   . Anemia    Past Surgical History  Procedure Laterality Date  . Prostate surgery      prostate seed placement  . Av fistula placement, radiocephalic  [id number]    Left arm  . Eye surgery Left     cataract surgery  . Eye surgery Left     for eye  injury  . Capd insertion N/A 03/06/2013    Procedure: LAPAROSCOPIC INSERTION CONTINUOUS AMBULATORY PERITONEAL DIALYSIS  (CAPD) CATHETER;  Surgeon: Edward Jolly, MD;  Location: [place];  Service: General;  Laterality: N/A;  . Umbilical hernia repair N/A 03/06/2013    Procedure: HERNIA REPAIR UMBILICAL WITH MESH;  Surgeon: Edward Jolly, MD;  Location: [facility] OR;  Service: General;  Laterality: N/A;   Family History  Problem Relation Age of Onset  . Cancer Mother   . Heart disease Mother   . Cancer Father    History  Substance Use Topics  . Smoking status: Never Smoker   . Smokeless tobacco: Never Used  . Alcohol Use: No    Review of Systems  Unable to perform ROS: Dementia  Cardiovascular: Positive for near-syncope.    Allergies  Cardura  Home Medications   Current Outpatient Rx  Name  Route  Sig  Dispense  Refill  . allopurinol (ZYLOPRIM) 100 MG tablet   Oral   Take 100 mg by mouth daily.           Marland Kitchen atorvastatin (LIPITOR) 40 MG tablet   Oral   Take 40 mg by mouth daily.           . calcitRIOL (ROCALTROL) 0.25 MCG capsule   Oral   Take 0.25 mcg by mouth daily.           . furosemide (LASIX) 80  MG tablet   Oral   Take 80 mg by mouth 2 (two) times daily as needed for fluid.          . hydrALAZINE (APRESOLINE) 50 MG tablet   Oral   Take 50 mg by mouth 2 (two) times daily.         Marland Kitchen losartan (COZAAR) 100 MG tablet   Oral   Take 100 mg by mouth daily.           . metoprolol (LOPRESSOR) 100 MG tablet   Oral   Take 100 mg by mouth 2 (two) times daily.         Marland Kitchen oxyCODONE-acetaminophen (ROXICET) 5-325 MG per tablet   Oral   Take 1-2 tablets by mouth every 4 (four) hours as needed for severe pain.   50 tablet   0   . ACCU-CHEK COMPACT STRIPS test strip               . aspirin 325 MG EC tablet   Oral   Take 325 mg by mouth daily.          BP 118/59  Pulse 104  Temp(Src) 99.2 F (37.3 C) (Rectal)  Resp 16  SpO2 95% Physical  Exam  Constitutional: He appears well-developed and well-nourished. No distress.  HENT:  Head: Normocephalic and atraumatic.  Right Ear: External ear normal.  Left Ear: External ear normal.  Nose: Nose normal.  Mouth/Throat: No oropharyngeal exudate.  Eyes: Conjunctivae are normal.  Neck: Neck supple.  Cardiovascular: Regular rhythm, normal heart sounds and intact distal pulses.  Tachycardia present.   Pulmonary/Chest: Effort normal. He has rales. He exhibits no tenderness.  Abdominal: Soft. Bowel sounds are normal. There is tenderness in the right lower quadrant, periumbilical area, suprapubic area and left lower quadrant. There is no rigidity, no rebound and no guarding.    Umbilical incision site with small amount of serosanguinous fluid. Hematoma appreciated below the umbilicus as marked. Area is tender and tense to palpation. Umbilical hernia appears to be back.  Musculoskeletal: He exhibits no edema.  Neurological: He is alert.  Oriented to self and situation. Moving all extremities equally  Skin: Skin is warm and dry. He is not diaphoretic.    ED Course  Procedures (including critical care time) Medications  morphine 2 MG/ML injection 2 mg (2 mg Intravenous Given 03/11/13 0930)  furosemide (LASIX) injection 40 mg (40 mg Intravenous Given 03/11/13 1127)  sodium polystyrene (KAYEXALATE) 15 GM/60ML suspension 30 g (30 g Oral Given 03/11/13 1250)    Labs Review Labs Reviewed  CBC WITH DIFFERENTIAL - Abnormal; Notable for the following:    RBC 3.19 (*)    Hemoglobin 9.6 (*)    HCT 30.0 (*)    RDW 15.8 (*)    Monocytes Relative 18 (*)    Monocytes Absolute 1.2 (*)    All other components within normal limits  COMPREHENSIVE METABOLIC PANEL - Abnormal; Notable for the following:    Potassium 6.0 (*)    CO2 17 (*)    BUN 98 (*)    Creatinine, Ser 12.78 (*)    GFR calc non Af Amer 3 (*)    GFR calc Af Amer 4 (*)    All other components within normal limits  URINALYSIS,  ROUTINE W REFLEX MICROSCOPIC - Abnormal; Notable for the following:    APPearance CLOUDY (*)    Hgb urine dipstick MODERATE (*)    Protein, ur 100 (*)    All other  components within normal limits  PHOSPHORUS - Abnormal; Notable for the following:    Phosphorus 7.8 (*)    All other components within normal limits  PROTIME-INR - Abnormal; Notable for the following:    Prothrombin Time 16.0 (*)    All other components within normal limits  APTT - Abnormal; Notable for the following:    aPTT 41 (*)    All other components within normal limits  CK - Abnormal; Notable for the following:    Total CK 278 (*)    All other components within normal limits  CULTURE, BLOOD (ROUTINE X 2)  CULTURE, BLOOD (ROUTINE X 2)  URINE CULTURE  GLUCOSE, CAPILLARY  URINE MICROSCOPIC-ADD ON  MAGNESIUM  AMMONIA  CG4 I-STAT (LACTIC ACID)   Imaging Review Ct Head Wo Contrast  03/11/2013   CLINICAL DATA:  Mental status changes.  EXAM: CT HEAD WITHOUT CONTRAST  TECHNIQUE: Contiguous axial images were obtained from the base of the skull through the vertex without intravenous contrast.  COMPARISON:  None.  FINDINGS: Mild age related cerebral atrophy, ventriculomegaly and periventricular white matter disease. No extra-axial fluid collections are identified. No CT findings for acute hemispheric infarction an or intracranial hemorrhage. No mass lesions. The brainstem and cerebellum are grossly normal. Moderate vascular calcifications are noted. The globes are intact. Scleral banding is noted on the left. No lens is identified. A small scalp cyst is noted at the occiput.  The bony structures are intact. No skull fracture or bone lesion. The paranasal sinuses and mastoid air cells are clear.  IMPRESSION: Mild age related cerebral atrophy, ventriculomegaly and periventricular white matter disease. No acute intracranial findings or mass lesions.   Electronically Signed   By: Kalman Jewels M.D.   On: 03/11/2013 10:43   Dg  Chest Port 1 View  03/11/2013   CLINICAL DATA:  Syncope, altered mental status  EXAM: PORTABLE CHEST - 1 VIEW  COMPARISON:  03/01/2013  FINDINGS: Cardiomegaly. No acute infiltrate or pulmonary edema. Mild basilar atelectasis.  IMPRESSION: Cardiomegaly. No acute infiltrate or pulmonary edema. Mild basilar atelectasis.   Electronically Signed   By: Lahoma Crocker M.D.   On: 03/11/2013 09:21    EKG Interpretation    Date/Time:  Saturday March 11 2013 08:17:16 EST Ventricular Rate:  104 PR Interval:  155 QRS Duration: 87 QT Interval:  332 QTC Calculation: 437 R Axis:   -20 Text Interpretation:  Sinus tachycardia Probable left atrial enlargement Borderline left axis deviation Confirmed by Christy Gentles  MD, DONALD ([id number]) on 03/11/2013 8:21:37 AM            MDM   1. Altered mental status   2. ESRD (end stage renal disease)   3. Anemia   4. CKD (chronic kidney disease) stage 5, GFR less than 15 ml/min   5. DM (diabetes mellitus), type 2 with renal complications   6. Encephalopathy   7. Hematoma of abdominal wall, initial encounter   8. Hyperkalemia   9. Hyperlipidemia     9:22 AM Discussed patient case with Dr. Gershon Crane who will consult with Dr. Abran Cantor who is in hospital at this time to come down and evaluate the patient.   Evaluate for possible intra-abdominal, postop pneumonia and complications with blood work and a chest x-ray. Will off on any sort of intra-abdominal scanning until Dr. Excell Seltzer has consulted on the patient.   9:44 AM Dr. Abran Cantor has seen and evaluated patient and does not think hematoma is cause of mental status changes. He will  be available for consultation and re-checks of hematomas   Patient is afebrile, alert and oriented to self and situation. No gross neurofocal deficits appreciated on examination. Hematoma to surgical incision appreciated with some serosangious drainage. Dr. Abran Cantor has evaluated the patient and will follow patient on floor as consultant. I  have reviewed nursing notes, vital signs, and all appropriate lab and imaging results for this patient. Patient will be admitted for further evaluation and management. Patient d/w with Dr. Christy Gentles, agrees with plan.      Harlow Mares, PA-C 03/11/13 1354

## 2013-03-11 NOTE — Consult Note (Signed)
[facility] Consult Note     Date: 03/11/2013                  Patient Name:  Sean Best  MRN: [id number]  DOB: [birth year]  Age / Sex: 80 y.o., male         PCP: [name], MD                 Service Requesting Consult: [facility] (covering for [facility] [employer] [facility])                 Reason for Consult: Acute encephalopathy, hyperkalemia, elevated Cr in CKD V / ESRD pt POD#5 from PD cath placement and hernia repair            Chief Complaint: altered mental status HPI: Pt is a 80yo male with CKD stage V, not yet on dialysis, presenting with AMS 6 days post-op from peritoneal dialysis catheter and hernia repair by Dr. Excell Seltzer. PMH significant otherwise for DM type 2, chronic anemia, HTN, hx CVA; pt has had left wrist AV fistula placement for 2 years but has never used it. Neprhology consulted for acute on chronic renal failure, hyperkalemia, and possible need for dialysis.   Pt unable to provide history; history obtained from daughter. Since coming home after his surgery 1/5, pt has had intermittent confusion / somnolence, but overall has been doing well at home (was up and ambulating yesterday), has not been complaining of fever / chills, had not had vomiting or diarrhea. Pt has been taking oxycodone PRN at home since the operation, but has generally tolerated it well without definite AMS after taking it. Daughter does report pt has had intermittent hypoglycemia since his operation and has not been taking good PO; she reports he "drank pretty well," yesterday. Pt's labs on 1/5 as follows: Cr 9.19, BUN 72, K 4.8, up today to 12.78, 98, and 6.0 respectively.   Per daughter, pt was a previous pt of Dr. Cherlyn Cushing and has "felt well for the past 4 years or so and has been waiting to feel bad or get confused or have other problems" before definitely starting dialysis. Pt now followed by Dr. Joelyn Oms. He does not take NSAIDs at home, but does take Lasix 80 BID and is on Cozaar  daily.  Past Medical History  Diagnosis Date  . Stroke   . Claudication   . Diabetes mellitus   . Hypertension   . Hyperlipidemia   . Cancer     prostate cancer  . Gout   . Overweight(278.02)   . Arthritis   . Chronic total occlusion of artery of the extremities     pt not aware of this  . Chronic kidney disease     End stage renal disease  . Umbilical hernia   . Anemia     Past Surgical History  Procedure Laterality Date  . Prostate surgery      prostate seed placement  . Av fistula placement, radiocephalic  [id number]    Left arm  . Eye surgery Left     cataract surgery  . Eye surgery Left     for eye injury  . Capd insertion N/A 03/06/2013    Procedure: LAPAROSCOPIC INSERTION CONTINUOUS AMBULATORY PERITONEAL DIALYSIS  (CAPD) CATHETER;  Surgeon: Edward Jolly, MD;  Location: [place];  Service: General;  Laterality: N/A;  . Umbilical hernia repair N/A 03/06/2013    Procedure: HERNIA REPAIR UMBILICAL WITH MESH;  Surgeon: Edward Jolly, MD;  Location: [facility] OR;  Service: General;  Laterality: N/A;    Family History  Problem Relation Age of Onset  . Cancer Mother   . Heart disease Mother   . Cancer Father    Social History:  reports that he has never smoked. He has never used smokeless tobacco. He reports that he does not drink alcohol or use illicit drugs.  Allergies:  Allergies  Allergen Reactions  . Cardura [Doxazosin Mesylate]     Hallucinations     (Not in a hospital admission)  Results for orders placed during the hospital encounter of 03/11/13 (from the past 48 hour(s))  CBC WITH DIFFERENTIAL     Status: Abnormal   Collection Time    03/11/13  9:11 AM      Result Value Range   WBC 6.8  4.0 - 10.5 K/uL   RBC 3.19 (*) 4.22 - 5.81 MIL/uL   Hemoglobin 9.6 (*) 13.0 - 17.0 g/dL   HCT 30.0 (*) 39.0 - 52.0 %   MCV 94.0  78.0 - 100.0 fL   MCH 30.1  26.0 - 34.0 pg   MCHC 32.0  30.0 - 36.0 g/dL   RDW 15.8 (*) 11.5 - 15.5 %   Platelets 218  150 - 400  K/uL   Neutrophils Relative % 63  43 - 77 %   Neutro Abs 4.3  1.7 - 7.7 K/uL   Lymphocytes Relative 15  12 - 46 %   Lymphs Abs 1.0  0.7 - 4.0 K/uL   Monocytes Relative 18 (*) 3 - 12 %   Monocytes Absolute 1.2 (*) 0.1 - 1.0 K/uL   Eosinophils Relative 5  0 - 5 %   Eosinophils Absolute 0.3  0.0 - 0.7 K/uL   Basophils Relative 0  0 - 1 %   Basophils Absolute 0.0  0.0 - 0.1 K/uL  COMPREHENSIVE METABOLIC PANEL     Status: Abnormal   Collection Time    03/11/13  9:11 AM      Result Value Range   Sodium 140  137 - 147 mEq/L   Potassium 6.0 (*) 3.7 - 5.3 mEq/L   Chloride 103  96 - 112 mEq/L   CO2 17 (*) 19 - 32 mEq/L   Glucose, Bld 90  70 - 99 mg/dL   BUN 98 (*) 6 - 23 mg/dL   Creatinine, Ser 12.78 (*) 0.50 - 1.35 mg/dL   Calcium 9.4  8.4 - 10.5 mg/dL   Total Protein 7.0  6.0 - 8.3 g/dL   Albumin 3.5  3.5 - 5.2 g/dL   AST 11  0 - 37 U/L   ALT 5  0 - 53 U/L   Alkaline Phosphatase 49  39 - 117 U/L   Total Bilirubin 0.3  0.3 - 1.2 mg/dL   GFR calc non Af Amer 3 (*) >90 mL/min   GFR calc Af Amer 4 (*) >90 mL/min   Comment: (NOTE)     The eGFR has been calculated using the CKD EPI equation.     This calculation has not been validated in all clinical situations.     eGFR's persistently <90 mL/min signify possible Chronic Kidney     Disease.  GLUCOSE, CAPILLARY     Status: None   Collection Time    03/11/13  9:17 AM      Result Value Range   Glucose-Capillary 92  70 - 99 mg/dL   Comment 1 Documented in Chart     Comment 2 Notify  RN    CG4 I-STAT (LACTIC ACID)     Status: None   Collection Time    03/11/13  9:30 AM      Result Value Range   Lactic Acid, Venous 1.06  0.5 - 2.2 mmol/L  URINALYSIS, ROUTINE W REFLEX MICROSCOPIC     Status: Abnormal   Collection Time    03/11/13 11:23 AM      Result Value Range   Color, Urine YELLOW  YELLOW   APPearance CLOUDY (*) CLEAR   Specific Gravity, Urine 1.016  1.005 - 1.030   pH 5.0  5.0 - 8.0   Glucose, UA NEGATIVE  NEGATIVE mg/dL   Hgb  urine dipstick MODERATE (*) NEGATIVE   Bilirubin Urine NEGATIVE  NEGATIVE   Ketones, ur NEGATIVE  NEGATIVE mg/dL   Protein, ur 100 (*) NEGATIVE mg/dL   Urobilinogen, UA 0.2  0.0 - 1.0 mg/dL   Nitrite NEGATIVE  NEGATIVE   Leukocytes, UA NEGATIVE  NEGATIVE  URINE MICROSCOPIC-ADD ON     Status: None   Collection Time    03/11/13 11:23 AM      Result Value Range   RBC / HPF 7-10  <3 RBC/hpf   Urine-Other AMORPHOUS URATES/PHOSPHATES    MAGNESIUM     Status: None   Collection Time    03/11/13 12:05 PM      Result Value Range   Magnesium 2.1  1.5 - 2.5 mg/dL  PHOSPHORUS     Status: Abnormal   Collection Time    03/11/13 12:05 PM      Result Value Range   Phosphorus 7.8 (*) 2.3 - 4.6 mg/dL  PROTIME-INR     Status: Abnormal   Collection Time    03/11/13 12:05 PM      Result Value Range   Prothrombin Time 16.0 (*) 11.6 - 15.2 seconds   INR 1.31  0.00 - 1.49  APTT     Status: Abnormal   Collection Time    03/11/13 12:05 PM      Result Value Range   aPTT 41 (*) 24 - 37 seconds   Comment:            IF BASELINE aPTT IS ELEVATED,     SUGGEST PATIENT RISK ASSESSMENT     BE USED TO DETERMINE APPROPRIATE     ANTICOAGULANT THERAPY.  AMMONIA     Status: None   Collection Time    03/11/13 12:05 PM      Result Value Range   Ammonia 31  11 - 60 umol/L  CK     Status: Abnormal   Collection Time    03/11/13 12:05 PM      Result Value Range   Total CK 278 (*) 7 - 232 U/L   Ct Head Wo Contrast  03/11/2013   CLINICAL DATA:  Mental status changes.  EXAM: CT HEAD WITHOUT CONTRAST  TECHNIQUE: Contiguous axial images were obtained from the base of the skull through the vertex without intravenous contrast.  COMPARISON:  None.  FINDINGS: Mild age related cerebral atrophy, ventriculomegaly and periventricular white matter disease. No extra-axial fluid collections are identified. No CT findings for acute hemispheric infarction an or intracranial hemorrhage. No mass lesions. The brainstem and  cerebellum are grossly normal. Moderate vascular calcifications are noted. The globes are intact. Scleral banding is noted on the left. No lens is identified. A small scalp cyst is noted at the occiput.  The bony structures are intact. No skull fracture or bone  lesion. The paranasal sinuses and mastoid air cells are clear.  IMPRESSION: Mild age related cerebral atrophy, ventriculomegaly and periventricular white matter disease. No acute intracranial findings or mass lesions.   Electronically Signed   By: Kalman Jewels M.D.   On: 03/11/2013 10:43   Dg Chest Port 1 View  03/11/2013   CLINICAL DATA:  Syncope, altered mental status  EXAM: PORTABLE CHEST - 1 VIEW  COMPARISON:  03/01/2013  FINDINGS: Cardiomegaly. No acute infiltrate or pulmonary edema. Mild basilar atelectasis.  IMPRESSION: Cardiomegaly. No acute infiltrate or pulmonary edema. Mild basilar atelectasis.   Electronically Signed   By: Lahoma Crocker M.D.   On: 03/11/2013 09:21    ROS: Level V caveat, unable to perform ROS secondary to mental status. As above in HPI, otherwise.  Blood pressure 118/59, pulse 104, temperature 99.2 F (37.3 C), temperature source Rectal, resp. rate 16, SpO2 95.00%. Physical Exam  Gen: obese, elderly AA male, does not respond to all questions appropriately or follow all commands HEENT: [place]/AT, PERRLA, EOMI, MMM Pulm: some scattered soft coarse breath sounds, but normal WOB and no wheezes Cardio: slight tachycardia, no murmur appreciated Ext: warm, well-perfused,  1+ edema bilaterally to LE; right UE with AVF at wrist + bruit Abd: soft, nontender, large eccymosis to lower abdomen, recently-placed PD cath site without drainage / bleeding  Some dried / oozing bleeding from umbilical incision but no purulent drainage or induration Neuro: altered, oriented to self only, rouses but falls asleep easily  Assessment/Plan 1. CKD stage V with acute worsening of Cr, uremia , hyperkalemia - not yet on dialysis but  essentially ESRD, POD#5 from hernia repair with PD cath placement. Cr increased 9 > 12 over 5 days, with worsening confusion / AMS. Acute insult possibly secondary to poor PO intake + ARB use at home, +/- underperfusion during surgery (but no definite hx of hypotension). EKG without frank changes despite high K, UA benign, no obvious signs / symptoms of infection. EKG shows some mild peaking of Best-waves inferiorly. CXR does not suggest frank fluid overload. Pt had 500 mL I&O cath in the ED. - likely needs HD at least in the short term; may need for several weeks until hernia operation site heals to be able to use PD at home - monitor electrolytes and Cr, renal function daily - s/p Kayexalate and Lasix ordered by primary and EDP - strict I&O, Foley probably indicated with AMS  - NPO for now, would resume renal diet once able to eat - consider repeat CXR and EKG tomorrow  2. Acute encephalopathy - most likely secondary to #1, with normal ammonia, normal lactic acid, nothing acute on CT - see immediately above - try to avoid sedating medicines - consider UDS - otherwise monitor clinically, other management / work-up per primary service  3. DM - per primary service; would recommend close monitoring given reported hypoglycemia at home, has reportedly been diet controlled for about 1 year  4. HTN, HLD - per primary team; would hold ARB acutely, at least  5. Eccymosis of abdominal wall / umbilical hernia - POD#5 from repair, has been seen by Dr. Excell Seltzer - management per primary / surgery teams; saline flushes of PD cath per Dr. Lear Ng initial note - would avoid heparin if / when needs dialysis  6. Anemia - acute on chronic, likely related to recent surgery, some bleeding from wound, but appears stable - monitor Hb, does not appear to acutely need transfusion - monitor closely for any acute  brisk bleeding  Please see also pending attending cosign for any edits / additions.   Emmaline Kluver, MD PGY-2, [facility] 03/11/2013, 2:08 PM I have seen and examined this patient and agree with plan per Dr Venetia Maxon.  80yo BM with CKD5 who comes to ER with progressive decrease in mentation since a PD cath and hernia were fixed on Monday.  He has been taking narcotics for pain.  Pt unable to give much history but daughter says his functional status was very good prior to surgery.  He has been on cozaar and Scr has increased from 9 to 12. K 6.0.  He does have an AVF that is a little tortuous but will try HD today.  If unable to use AVF then I might give him fluids, hold cozaar and see if mentation clears with time as I would like to avoid a HD catheter if possible.  Will also give narcan now. Marland Kitchen Sean Swamy T,MD 03/11/2013 3:10 PM

## 2013-03-11 NOTE — ED Notes (Signed)
Patient transported to CT 

## 2013-03-11 NOTE — Consult Note (Signed)
Reason for Consult: wound problems following umbilical hernia repair and peritoneal dialysis catheter insertion Referring Physician: EDP  Sean Best is an 80 y.o. male.  HPI: patient is a 80 year old male with multiple medical problems and end-stage renal disease with impending dialysis. He is 5 days status post laparoscopic  Insertion of peritoneal dialysis catheter and repair of umbilical hernia. He comes to the emergency department brought by his daughter do to 2-3 days of increasing confusion and disorientation and has had apparently 2 episodes of syncope or near syncope. He also has had some bloody drainage from his umbilical incision. She and he denies significant abdominal pain or fever.  Past Medical History  Diagnosis Date  . Stroke   . Claudication   . Diabetes mellitus   . Hypertension   . Hyperlipidemia   . Cancer     prostate cancer  . Gout   . Overweight(278.02)   . Arthritis   . Chronic total occlusion of artery of the extremities     pt not aware of this  . Chronic kidney disease     End stage renal disease  . Umbilical hernia   . Anemia     Past Surgical History  Procedure Laterality Date  . Prostate surgery      prostate seed placement  . Av fistula placement, radiocephalic  [id number]/7014    Left arm  . Eye surgery Left     cataract surgery  . Eye surgery Left     for eye injury  . Capd insertion N/A 03/06/2013    Procedure: LAPAROSCOPIC INSERTION CONTINUOUS AMBULATORY PERITONEAL DIALYSIS  (CAPD) CATHETER;  Surgeon: Edward Jolly, MD;  Location: [place];  Service: General;  Laterality: N/A;  . Umbilical hernia repair N/A 03/06/2013    Procedure: HERNIA REPAIR UMBILICAL WITH MESH;  Surgeon: Edward Jolly, MD;  Location: [facility] OR;  Service: General;  Laterality: N/A;    Family History  Problem Relation Age of Onset  . Cancer Mother   . Heart disease Mother   . Cancer Father     Social History:  reports that he has never smoked. He has never used  smokeless tobacco. He reports that he does not drink alcohol or use illicit drugs.  Allergies: No Known Allergies  No current facility-administered medications for this encounter.   Current Outpatient Prescriptions  Medication Sig Dispense Refill  . ACCU-CHEK COMPACT STRIPS test strip       . acetaminophen (TYLENOL) 500 MG tablet Take 500 mg by mouth every 6 (six) hours as needed.        Marland Kitchen allopurinol (ZYLOPRIM) 100 MG tablet Take 100 mg by mouth daily.        Marland Kitchen aspirin 325 MG EC tablet Take 325 mg by mouth daily.      Marland Kitchen atorvastatin (LIPITOR) 40 MG tablet Take 40 mg by mouth daily.        . calcitRIOL (ROCALTROL) 0.25 MCG capsule Take 0.25 mcg by mouth daily.        . calcitRIOL (ROCALTROL) 0.5 MCG capsule Take 1.5 mcg by mouth daily.       Marland Kitchen doxazosin (CARDURA) 2 MG tablet Take 4 mg by mouth at bedtime.       . furosemide (LASIX) 80 MG tablet Take 80 mg by mouth 2 (two) times daily as needed for fluid.       Marland Kitchen glimepiride (AMARYL) 4 MG tablet Take 4 mg by mouth 2 (two) times daily.        Marland Kitchen  hydrALAZINE (APRESOLINE) 50 MG tablet Take 50 mg by mouth 2 (two) times daily.      Marland Kitchen losartan (COZAAR) 100 MG tablet Take 100 mg by mouth daily.        . metoprolol (LOPRESSOR) 100 MG tablet Take 100 mg by mouth 2 (two) times daily.      Marland Kitchen oxyCODONE-acetaminophen (ROXICET) 5-325 MG per tablet Take 1-2 tablets by mouth every 4 (four) hours as needed for severe pain.  50 tablet  0     Results for orders placed during the hospital encounter of 03/11/13 (from the past 48 hour(s))  CBC WITH DIFFERENTIAL     Status: Abnormal   Collection Time    03/11/13  9:11 AM      Result Value Range   WBC 6.8  4.0 - 10.5 K/uL   RBC 3.19 (*) 4.22 - 5.81 MIL/uL   Hemoglobin 9.6 (*) 13.0 - 17.0 g/dL   HCT 30.0 (*) 39.0 - 52.0 %   MCV 94.0  78.0 - 100.0 fL   MCH 30.1  26.0 - 34.0 pg   MCHC 32.0  30.0 - 36.0 g/dL   RDW 15.8 (*) 11.5 - 15.5 %   Platelets 218  150 - 400 K/uL   Neutrophils Relative % 63  43 - 77 %    Neutro Abs 4.3  1.7 - 7.7 K/uL   Lymphocytes Relative 15  12 - 46 %   Lymphs Abs 1.0  0.7 - 4.0 K/uL   Monocytes Relative 18 (*) 3 - 12 %   Monocytes Absolute 1.2 (*) 0.1 - 1.0 K/uL   Eosinophils Relative 5  0 - 5 %   Eosinophils Absolute 0.3  0.0 - 0.7 K/uL   Basophils Relative 0  0 - 1 %   Basophils Absolute 0.0  0.0 - 0.1 K/uL  COMPREHENSIVE METABOLIC PANEL     Status: Abnormal (Preliminary result)   Collection Time    03/11/13  9:11 AM      Result Value Range   Sodium 140  137 - 147 mEq/L   Potassium 6.0 (*) 3.7 - 5.3 mEq/L   Chloride 103  96 - 112 mEq/L   CO2 17 (*) 19 - 32 mEq/L   Glucose, Bld 90  70 - 99 mg/dL   BUN 98 (*) 6 - 23 mg/dL   Creatinine, Ser 12.78 (*) 0.50 - 1.35 mg/dL   Calcium 9.4  8.4 - 10.5 mg/dL   Total Protein 7.0  6.0 - 8.3 g/dL   Albumin 3.5  3.5 - 5.2 g/dL   AST 11  0 - 37 U/L   ALT PENDING  0 - 53 U/L   Alkaline Phosphatase 49  39 - 117 U/L   Total Bilirubin 0.3  0.3 - 1.2 mg/dL   GFR calc non Af Amer 3 (*) >90 mL/min   GFR calc Af Amer 4 (*) >90 mL/min   Comment: (NOTE)     The eGFR has been calculated using the CKD EPI equation.     This calculation has not been validated in all clinical situations.     eGFR's persistently <90 mL/min signify possible Chronic Kidney     Disease.  GLUCOSE, CAPILLARY     Status: None   Collection Time    03/11/13  9:17 AM      Result Value Range   Glucose-Capillary 92  70 - 99 mg/dL   Comment 1 Documented in Chart     Comment 2 Notify RN  CG4 I-STAT (LACTIC ACID)     Status: None   Collection Time    03/11/13  9:30 AM      Result Value Range   Lactic Acid, Venous 1.06  0.5 - 2.2 mmol/L    Dg Chest Port 1 View  03/11/2013   CLINICAL DATA:  Syncope, altered mental status  EXAM: PORTABLE CHEST - 1 VIEW  COMPARISON:  03/01/2013  FINDINGS: Cardiomegaly. No acute infiltrate or pulmonary edema. Mild basilar atelectasis.  IMPRESSION: Cardiomegaly. No acute infiltrate or pulmonary edema. Mild basilar  atelectasis.   Electronically Signed   By: Lahoma Crocker M.D.   On: 03/11/2013 09:21    Review of Systems  Constitutional: Negative for fever and chills.  Gastrointestinal: Negative for nausea, vomiting and abdominal pain.  Neurological: Positive for loss of consciousness.   Blood pressure 150/62, temperature 99.2 F (37.3 C), temperature source Rectal, resp. rate 16, SpO2 99.00%. Physical Exam General: Obese elderly Afro-American male who is responsive and in no distress but oriented only to person Abdomen: Obese. Generally soft and nontender. There was a large area of bruising and ecchymosis extending across the lower abdomen away from his umbilical incision. There is some blistering and fluid under the umbilical skin. Catheter exit site looks okay. There is some blood-tinged fluid within the catheter. There is no purulent drainage or erythema.  Assessment/Plan: Worsening mental status. 5 days status post surgery as above. He has had bleeding from his umbilical incision and significant ecchymosis across his abdomen but hemoglobin is stable and I do not believe he has a large hematoma or major blood loss. I do not see evidence of infection of his incisions. He does have some fluid underneath the umbilical skin which all aspirated. No active bleeding. I expect his mental status changes multifactorial related to surgery and medication and multiple medical problems but most significantly he appears to have worsening renal function and likely will require dialysis. I believe he will need admission to the medical service I will follow for his wound and catheter. I would begin saline flushes of his peritoneal dialysis catheter.  Yeilyn Gent T 03/11/2013, 9:47 AM

## 2013-03-12 LAB — RENAL FUNCTION PANEL
Albumin: 3.1 g/dL — ABNORMAL LOW (ref 3.5–5.2)
BUN: 74 mg/dL — AB (ref 6–23)
CHLORIDE: 103 meq/L (ref 96–112)
CO2: 20 mEq/L (ref 19–32)
Calcium: 9.2 mg/dL (ref 8.4–10.5)
Creatinine, Ser: 10.5 mg/dL — ABNORMAL HIGH (ref 0.50–1.35)
GFR calc Af Amer: 5 mL/min — ABNORMAL LOW (ref >90)
GFR, EST NON AFRICAN AMERICAN: 4 mL/min — AB (ref >90)
GLUCOSE: 93 mg/dL (ref 70–99)
Phosphorus: 6.9 mg/dL — ABNORMAL HIGH (ref 2.3–4.6)
Potassium: 4.8 mEq/L (ref 3.7–5.3)
Sodium: 141 mEq/L (ref 137–147)

## 2013-03-12 LAB — GLUCOSE, CAPILLARY
Glucose-Capillary: 99 mg/dL (ref 70–99)
Glucose-Capillary: 108 mg/dL — ABNORMAL HIGH (ref 70–99)
Glucose-Capillary: 115 mg/dL — ABNORMAL HIGH (ref 70–99)
Glucose-Capillary: 103 mg/dL — ABNORMAL HIGH (ref 70–99)

## 2013-03-12 LAB — MRSA PCR SCREENING: MRSA by PCR: NEGATIVE

## 2013-03-12 LAB — URINE CULTURE
Colony Count: NO GROWTH
Culture: NO GROWTH

## 2013-03-12 MED ORDER — SODIUM CHLORIDE 0.9 % IV BOLUS (SEPSIS)
500.0000 mL | Freq: Once | INTRAVENOUS | Status: AC
  Administered 2013-03-12: 500 mL via INTRAVENOUS

## 2013-03-12 MED ORDER — CLONIDINE HCL 0.1 MG PO TABS
0.1000 mg | ORAL_TABLET | ORAL | Status: DC | PRN
  Administered 2013-03-12: 0.1 mg via ORAL
  Filled 2013-03-12 (×3): qty 1

## 2013-03-12 MED ORDER — AMLODIPINE BESYLATE 5 MG PO TABS
5.0000 mg | ORAL_TABLET | Freq: Every day | ORAL | Status: DC
  Administered 2013-03-12 – 2013-03-17 (×5): 5 mg via ORAL
  Filled 2013-03-12 (×7): qty 1

## 2013-03-12 MED ORDER — BISACODYL 10 MG RE SUPP
10.0000 mg | Freq: Once | RECTAL | Status: AC
  Administered 2013-03-12: 10 mg via RECTAL
  Filled 2013-03-12: qty 1

## 2013-03-12 NOTE — Progress Notes (Signed)
03/12/13 1646 nsg Peritoneal catheter unable to access for flushing due to non compatible PD tubes. RN tried  3-4 tubes available on the floor  along with another Renal RN but none of the tubes will fit patient's PD catheter. MD on call paged waiting for callback.

## 2013-03-12 NOTE — Progress Notes (Signed)
03/12/13 1720 nsg PD tube connector not available at this time. We will order and convert patient when available. Patient's RN made aware.

## 2013-03-12 NOTE — Progress Notes (Signed)
Patient ID: Sean Best, male   DOB: June 15, 1933, 80 y.o.   MRN: [id number] [facility]  Patient name: Sean Best Medical record number: [id number] Date of birth: 1933-07-31 Age: 80 y.o. Gender: male  Primary Care Provider: [name], MD  Subjective: Pt nonverbal today. Awake.  Objective: Temp:  [97.7 F (36.5 C)-100.4 F (38 C)] 99 F (37.2 C) (01/11 0415) Pulse Rate:  [72-105] 105 (01/11 0415) Resp:  [8-17] 17 (01/11 0415) BP: (100-170)/(57-98) 166/98 mmHg (01/11 0415) SpO2:  [93 %-99 %] 96 % (01/11 0415) Weight:  [206 lb 5.6 oz (93.6 kg)-207 lb 7.3 oz (94.1 kg)] 207 lb 7.3 oz (94.1 kg) (01/11 0415)  Intake/Output Summary (Last 24 hours) at 03/12/13 0832 Last data filed at 03/12/13 0417  Gross per 24 hour  Intake      0 ml  Output   2000 ml  Net  -2000 ml   HD received yesterday for 2000  Physical Exam: General: Obese AAM. Awake. Nonverbal. Weak. Followed commands.  Cardiovascular: Mildly tachycardic. Regular.  Respiratory: Very mild crackles L>R. No wheeze.  Abdomen: Soft. Large ecchymosis periumbilical/ lower abd.  recently-placed PD cath site  Extremities: +2 edema bilaterally LE, right UE with AVF at wrist +bruit Neuro: Awake. Weak. Nonverbal. Does respond to commands. + asterixis  Laboratory:  Recent Labs Lab 03/06/13 0559 03/11/13 0911  WBC 7.0 6.8  HGB 10.1* 9.6*  HCT 32.3* 30.0*  PLT 234 218    Recent Labs Lab 03/06/13 0559 03/11/13 0911 03/12/13 0345  NA 140 140 141  K 4.8 6.0* 4.8  CL 105 103 103  CO2 16* 17* 20  BUN 72* 98* 74*  CREATININE 9.19* 12.78* 10.50*  CALCIUM 9.6 9.4 9.2  PROT  --  7.0  --   BILITOT  --  0.3  --   ALKPHOS  --  49  --   ALT  --  5  --   AST  --  11  --   GLUCOSE 106* 90 93      Imaging/Diagnostic Tests: Ct Head Wo Contrast  03/11/2013   CLINICAL DATA:  Mental status changes.  EXAM: CT HEAD WITHOUT CONTRAST  TECHNIQUE: Contiguous axial images were obtained from the base of the skull  through the vertex without intravenous contrast.  COMPARISON:  None.  FINDINGS: Mild age related cerebral atrophy, ventriculomegaly and periventricular white matter disease. No extra-axial fluid collections are identified. No CT findings for acute hemispheric infarction an or intracranial hemorrhage. No mass lesions. The brainstem and cerebellum are grossly normal. Moderate vascular calcifications are noted. The globes are intact. Scleral banding is noted on the left. No lens is identified. A small scalp cyst is noted at the occiput.  The bony structures are intact. No skull fracture or bone lesion. The paranasal sinuses and mastoid air cells are clear.  IMPRESSION: Mild age related cerebral atrophy, ventriculomegaly and periventricular white matter disease. No acute intracranial findings or mass lesions.   Electronically Signed   By: Kalman Jewels M.D.   On: 03/11/2013 10:43   Dg Chest Port 1 View  03/11/2013   CLINICAL DATA:  Syncope, altered mental status  EXAM: PORTABLE CHEST - 1 VIEW  COMPARISON:  03/01/2013  FINDINGS: Cardiomegaly. No acute infiltrate or pulmonary edema. Mild basilar atelectasis.  IMPRESSION: Cardiomegaly. No acute infiltrate or pulmonary edema. Mild basilar atelectasis.   Electronically Signed   By: Lahoma Crocker M.D.   On: 03/11/2013 09:21   Assessment/Plan  1. CKD stage V with  acute worsening of Cr, uremia , hyperkalemia - not yet on dialysis but essentially ESRD, POD#6 from hernia repair with PD cath placement. Cr increased 9 > 12 over 5 days, with worsening confusion / AMS. Acute insult possibly secondary to poor PO intake + ARB use at home, +/- underperfusion during surgery.  2. Acute encephalopathy - most likely secondary to acute on CKD, with normal ammonia, normal lactic acid, nothing acute on CT.  3. DM  4. HTN, HLD - Elevated today  5. Eccymosis of abdominal wall / umbilical hernia - POD #6. Management by Dr. Excell Seltzer. 6. Anemia - acute on chronic, likely related to recent  surgery, some bleeding from wound, but appears stable    Plan:  - monitor Hb and renal function daily - Continue to hold ARB - Avoid sedating medications  - Strict I/O - Will see how patient does today, and likely schedule HD for tomorrow if no improvement. Needs HD at least in the short term; may need for several weeks until hernia operation site heals to be able to use PD at home.  Ma Hillock, DO 03/12/2013, 7:30 AM PGY-2 I have seen and examined this patient and agree with plan per Dr Raoul Pitch.  A little more arousable but still with some myoclonus and asterixis.  Will see how he does today and if MS has not improved by tomorrow then will do HD.  Will give 500cc fluids today. Sean Emel T,MD 03/12/2013 10:16 AM

## 2013-03-12 NOTE — Progress Notes (Addendum)
Subjective: Arousable but very lethargic and minimally conversive. Speaks with eyes closed. No acute distress  Objective: Weight change:   Intake/Output Summary (Last 24 hours) at 03/12/13 0904 Last data filed at 03/12/13 0417  Gross per 24 hour  Intake      0 ml  Output   2000 ml  Net  -2000 ml   Filed Vitals:   03/11/13 2348 03/12/13 0415 03/12/13 0756 03/12/13 0800  BP: 159/80 166/98 175/81   Pulse: 99 105 107   Temp: 98.7 F (37.1 C) 99 F (37.2 C)  98.9 F (37.2 C)  TempSrc: Oral Oral  Oral  Resp: '17 17 30   ' Height:      Weight:  94.1 kg (207 lb 7.3 oz)    SpO2: 94% 96% 94%     General Appearance: Alert, nonverbal, obese, follows simple commands Lungs: Clear to auscultation bilaterally, respirations unlabored Heart: Regular rate and mildly tachycardic, S1 and S2 normal, no murmur, rub or gallop Abdomen: Soft, large periumbilical ecchymosis, lower abdomen with PD catheter site Extremities: Extremities 2+ edema bilaterally in the lower extremities. Right upper extremity with AV fistula at the wrist with a positive bruit Neuro: Awake, nonverbal, nonfocal. Responds to commands  Lab Results: Results for orders placed during the hospital encounter of 03/11/13 (from the past 48 hour(s))  CBC WITH DIFFERENTIAL     Status: Abnormal   Collection Time    03/11/13  9:11 AM      Result Value Range   WBC 6.8  4.0 - 10.5 K/uL   RBC 3.19 (*) 4.22 - 5.81 MIL/uL   Hemoglobin 9.6 (*) 13.0 - 17.0 g/dL   HCT 30.0 (*) 39.0 - 52.0 %   MCV 94.0  78.0 - 100.0 fL   MCH 30.1  26.0 - 34.0 pg   MCHC 32.0  30.0 - 36.0 g/dL   RDW 15.8 (*) 11.5 - 15.5 %   Platelets 218  150 - 400 K/uL   Neutrophils Relative % 63  43 - 77 %   Neutro Abs 4.3  1.7 - 7.7 K/uL   Lymphocytes Relative 15  12 - 46 %   Lymphs Abs 1.0  0.7 - 4.0 K/uL   Monocytes Relative 18 (*) 3 - 12 %   Monocytes Absolute 1.2 (*) 0.1 - 1.0 K/uL   Eosinophils Relative 5  0 - 5 %   Eosinophils Absolute 0.3  0.0 - 0.7 K/uL    Basophils Relative 0  0 - 1 %   Basophils Absolute 0.0  0.0 - 0.1 K/uL  COMPREHENSIVE METABOLIC PANEL     Status: Abnormal   Collection Time    03/11/13  9:11 AM      Result Value Range   Sodium 140  137 - 147 mEq/L   Potassium 6.0 (*) 3.7 - 5.3 mEq/L   Chloride 103  96 - 112 mEq/L   CO2 17 (*) 19 - 32 mEq/L   Glucose, Bld 90  70 - 99 mg/dL   BUN 98 (*) 6 - 23 mg/dL   Creatinine, Ser 12.78 (*) 0.50 - 1.35 mg/dL   Calcium 9.4  8.4 - 10.5 mg/dL   Total Protein 7.0  6.0 - 8.3 g/dL   Albumin 3.5  3.5 - 5.2 g/dL   AST 11  0 - 37 U/L   ALT 5  0 - 53 U/L   Alkaline Phosphatase 49  39 - 117 U/L   Total Bilirubin 0.3  0.3 - 1.2 mg/dL  GFR calc non Af Amer 3 (*) >90 mL/min   GFR calc Af Amer 4 (*) >90 mL/min   Comment: (NOTE)     The eGFR has been calculated using the CKD EPI equation.     This calculation has not been validated in all clinical situations.     eGFR's persistently <90 mL/min signify possible Chronic Kidney     Disease.  GLUCOSE, CAPILLARY     Status: None   Collection Time    03/11/13  9:17 AM      Result Value Range   Glucose-Capillary 92  70 - 99 mg/dL   Comment 1 Documented in Chart     Comment 2 Notify RN    CG4 I-STAT (LACTIC ACID)     Status: None   Collection Time    03/11/13  9:30 AM      Result Value Range   Lactic Acid, Venous 1.06  0.5 - 2.2 mmol/L  URINALYSIS, ROUTINE W REFLEX MICROSCOPIC     Status: Abnormal   Collection Time    03/11/13 11:23 AM      Result Value Range   Color, Urine YELLOW  YELLOW   APPearance CLOUDY (*) CLEAR   Specific Gravity, Urine 1.016  1.005 - 1.030   pH 5.0  5.0 - 8.0   Glucose, UA NEGATIVE  NEGATIVE mg/dL   Hgb urine dipstick MODERATE (*) NEGATIVE   Bilirubin Urine NEGATIVE  NEGATIVE   Ketones, ur NEGATIVE  NEGATIVE mg/dL   Protein, ur 100 (*) NEGATIVE mg/dL   Urobilinogen, UA 0.2  0.0 - 1.0 mg/dL   Nitrite NEGATIVE  NEGATIVE   Leukocytes, UA NEGATIVE  NEGATIVE  URINE MICROSCOPIC-ADD ON     Status: None    Collection Time    03/11/13 11:23 AM      Result Value Range   RBC / HPF 7-10  <3 RBC/hpf   Urine-Other AMORPHOUS URATES/PHOSPHATES    MAGNESIUM     Status: None   Collection Time    03/11/13 12:05 PM      Result Value Range   Magnesium 2.1  1.5 - 2.5 mg/dL  PHOSPHORUS     Status: Abnormal   Collection Time    03/11/13 12:05 PM      Result Value Range   Phosphorus 7.8 (*) 2.3 - 4.6 mg/dL  PROTIME-INR     Status: Abnormal   Collection Time    03/11/13 12:05 PM      Result Value Range   Prothrombin Time 16.0 (*) 11.6 - 15.2 seconds   INR 1.31  0.00 - 1.49  APTT     Status: Abnormal   Collection Time    03/11/13 12:05 PM      Result Value Range   aPTT 41 (*) 24 - 37 seconds   Comment:            IF BASELINE aPTT IS ELEVATED,     SUGGEST PATIENT RISK ASSESSMENT     BE USED TO DETERMINE APPROPRIATE     ANTICOAGULANT THERAPY.  AMMONIA     Status: None   Collection Time    03/11/13 12:05 PM      Result Value Range   Ammonia 31  11 - 60 umol/L  CK     Status: Abnormal   Collection Time    03/11/13 12:05 PM      Result Value Range   Total CK 278 (*) 7 - 232 U/L  HEPATITIS B SURFACE ANTIGEN  Status: None   Collection Time    03/11/13  4:50 PM      Result Value Range   Hepatitis B Surface Ag NEGATIVE  NEGATIVE   Comment: Performed at [place], TOTAL     Status: None   Collection Time    03/11/13  4:50 PM      Result Value Range   Hep B Core Total Ab NON REACTIVE  NON REACTIVE   Comment: Performed at [facility] ANTIBODY     Status: None   Collection Time    03/11/13  4:50 PM      Result Value Range   Hep B S Ab NEGATIVE  NEGATIVE   Comment: Performed at [place], CAPILLARY     Status: None   Collection Time    03/11/13  9:56 PM      Result Value Range   Glucose-Capillary 93  70 - 99 mg/dL   Comment 1 Notify RN    MRSA PCR SCREENING     Status: None   Collection Time     03/12/13  2:50 AM      Result Value Range   MRSA by PCR NEGATIVE  NEGATIVE   Comment:            The GeneXpert MRSA Assay (FDA     approved for NASAL specimens     only), is one component of a     comprehensive MRSA colonization     surveillance program. It is not     intended to diagnose MRSA     infection nor to guide or     monitor treatment for     MRSA infections.  RENAL FUNCTION PANEL     Status: Abnormal   Collection Time    03/12/13  3:45 AM      Result Value Range   Sodium 141  137 - 147 mEq/L   Potassium 4.8  3.7 - 5.3 mEq/L   Comment: DELTA CHECK NOTED   Chloride 103  96 - 112 mEq/L   CO2 20  19 - 32 mEq/L   Glucose, Bld 93  70 - 99 mg/dL   BUN 74 (*) 6 - 23 mg/dL   Creatinine, Ser 10.50 (*) 0.50 - 1.35 mg/dL   Calcium 9.2  8.4 - 10.5 mg/dL   Phosphorus 6.9 (*) 2.3 - 4.6 mg/dL   Albumin 3.1 (*) 3.5 - 5.2 g/dL   GFR calc non Af Amer 4 (*) >90 mL/min   GFR calc Af Amer 5 (*) >90 mL/min   Comment: (NOTE)     The eGFR has been calculated using the CKD EPI equation.     This calculation has not been validated in all clinical situations.     eGFR's persistently <90 mL/min signify possible Chronic Kidney     Disease.  GLUCOSE, CAPILLARY     Status: None   Collection Time    03/12/13  7:55 AM      Result Value Range   Glucose-Capillary 99  70 - 99 mg/dL    Studies/Results: Ct Head Wo Contrast  03/11/2013   CLINICAL DATA:  Mental status changes.  EXAM: CT HEAD WITHOUT CONTRAST  TECHNIQUE: Contiguous axial images were obtained from the base of the skull through the vertex without intravenous contrast.  COMPARISON:  None.  FINDINGS: Mild age related cerebral atrophy, ventriculomegaly and periventricular white matter disease. No extra-axial fluid collections  are identified. No CT findings for acute hemispheric infarction an or intracranial hemorrhage. No mass lesions. The brainstem and cerebellum are grossly normal. Moderate vascular calcifications are noted. The globes are  intact. Scleral banding is noted on the left. No lens is identified. A small scalp cyst is noted at the occiput.  The bony structures are intact. No skull fracture or bone lesion. The paranasal sinuses and mastoid air cells are clear.  IMPRESSION: Mild age related cerebral atrophy, ventriculomegaly and periventricular white matter disease. No acute intracranial findings or mass lesions.   Electronically Signed   By: Kalman Jewels M.D.   On: 03/11/2013 10:43   Dg Chest Port 1 View  03/11/2013   CLINICAL DATA:  Syncope, altered mental status  EXAM: PORTABLE CHEST - 1 VIEW  COMPARISON:  03/01/2013  FINDINGS: Cardiomegaly. No acute infiltrate or pulmonary edema. Mild basilar atelectasis.  IMPRESSION: Cardiomegaly. No acute infiltrate or pulmonary edema. Mild basilar atelectasis.   Electronically Signed   By: Lahoma Crocker M.D.   On: 03/11/2013 09:21   Medications: Scheduled Meds: . allopurinol  100 mg Oral Daily  . calcitRIOL  0.25 mcg Oral Daily  . metoprolol  25 mg Oral BID   Continuous Infusions:  PRN Meds:.acetaminophen, acetaminophen, furosemide, morphine injection, ondansetron (ZOFRAN) IV, ondansetron, senna-docusate  Assessment/Plan: Active Problems:   CKD (chronic kidney disease) stage 5, GFR less than 15 ml/min - as per renal. Will probably dialyzed tomorrow - appreciate                                                                                                              renal consultation per Dr. Mercy Moore   Altered mental state - no evidence for infection yet. Suspect this is multifactorial but more related to uremia   Encephalopathy - as above - avoid sedating medications. Normal ammonia normal lactic acid and head CT nonacute   Hyperkalemia - improved with Kayexalate and Lasix - 4.8   Hematoma of abdominal wall - as per surgery, not felt to be contributing to current confusion/encephalopathy - appreciate surgical                                                     Surgical  followup   Anemia - likely secondary to chronic renal disease   DM (diabetes mellitus), type 2 with renal complications, diet controlled - controlled   Benign hypertension - controlled   Hyperlipidemia   Umbilical hernia   LOS: 1 day   Henrine Screws, MD 03/12/2013, 9:04 AM

## 2013-03-12 NOTE — Progress Notes (Signed)
Patient ID: Sean Best, male   DOB: 1934-01-22, 80 y.o.   MRN: [id number]    Subjective: Answers questions but confused still  Objective: Vital signs in last 24 hours: Temp:  [97.7 F (36.5 C)-100.4 F (38 C)] 98.9 F (37.2 C) (01/11 0800) Pulse Rate:  [72-107] 107 (01/11 0756) Resp:  [8-30] 30 (01/11 0756) BP: (100-175)/(57-98) 175/81 mmHg (01/11 0756) SpO2:  [93 %-97 %] 94 % (01/11 0756) Weight:  [206 lb 5.6 oz (93.6 kg)-207 lb 7.3 oz (94.1 kg)] 207 lb 7.3 oz (94.1 kg) (01/11 0415)    Intake/Output from previous day: 01/10 0701 - 01/11 0700 In: -  Out: 2000 [Urine:1000] Intake/Output this shift:    General appearance: no distress and slowed mentation GI: normal findings: soft, non-tender Incision/Wound: Wound ecchymosos and small umbilical heatoma stable.  No evidence of infection.  I aspirated a small amount of old bloody fluid from under umbilical skin  Lab Results:   Recent Labs  03/11/13 0911  WBC 6.8  HGB 9.6*  HCT 30.0*  PLT 218   BMET  Recent Labs  03/11/13 0911 03/12/13 0345  NA 140 141  K 6.0* 4.8  CL 103 103  CO2 17* 20  GLUCOSE 90 93  BUN 98* 74*  CREATININE 12.78* 10.50*  CALCIUM 9.4 9.2     Studies/Results: Ct Head Wo Contrast  03/11/2013   CLINICAL DATA:  Mental status changes.  EXAM: CT HEAD WITHOUT CONTRAST  TECHNIQUE: Contiguous axial images were obtained from the base of the skull through the vertex without intravenous contrast.  COMPARISON:  None.  FINDINGS: Mild age related cerebral atrophy, ventriculomegaly and periventricular white matter disease. No extra-axial fluid collections are identified. No CT findings for acute hemispheric infarction an or intracranial hemorrhage. No mass lesions. The brainstem and cerebellum are grossly normal. Moderate vascular calcifications are noted. The globes are intact. Scleral banding is noted on the left. No lens is identified. A small scalp cyst is noted at the occiput.  The bony structures are  intact. No skull fracture or bone lesion. The paranasal sinuses and mastoid air cells are clear.  IMPRESSION: Mild age related cerebral atrophy, ventriculomegaly and periventricular white matter disease. No acute intracranial findings or mass lesions.   Electronically Signed   By: Kalman Jewels M.D.   On: 03/11/2013 10:43   Dg Chest Port 1 View  03/11/2013   CLINICAL DATA:  Syncope, altered mental status  EXAM: PORTABLE CHEST - 1 VIEW  COMPARISON:  03/01/2013  FINDINGS: Cardiomegaly. No acute infiltrate or pulmonary edema. Mild basilar atelectasis.  IMPRESSION: Cardiomegaly. No acute infiltrate or pulmonary edema. Mild basilar atelectasis.   Electronically Signed   By: Lahoma Crocker M.D.   On: 03/11/2013 09:21    Anti-infectives: Anti-infectives   None      Assessment/Plan: ESRD, mental status changes, apparently secondary to uremia.  Possible dialysis again today POD #6 PD cath placement and UH repair.  Wound hematoma and ecchymosis- stable- no infection Begin PD cath flushes    LOS: 1 day    Shalane Florendo T 03/12/2013

## 2013-03-12 NOTE — Progress Notes (Signed)
[facility] notified of sys BP 171, no new orders received, nursing will cont to monitor

## 2013-03-13 LAB — RENAL FUNCTION PANEL
Albumin: 2.8 g/dL — ABNORMAL LOW (ref 3.5–5.2)
BUN: 88 mg/dL — ABNORMAL HIGH (ref 6–23)
CALCIUM: 9 mg/dL (ref 8.4–10.5)
CO2: 19 mEq/L (ref 19–32)
Chloride: 105 mEq/L (ref 96–112)
Creatinine, Ser: 11.03 mg/dL — ABNORMAL HIGH (ref 0.50–1.35)
GFR calc non Af Amer: 4 mL/min — ABNORMAL LOW (ref >90)
GFR, EST AFRICAN AMERICAN: 4 mL/min — AB (ref >90)
GLUCOSE: 85 mg/dL (ref 70–99)
PHOSPHORUS: 8.6 mg/dL — AB (ref 2.3–4.6)
Potassium: 4.9 mEq/L (ref 3.7–5.3)
Sodium: 143 mEq/L (ref 137–147)

## 2013-03-13 LAB — GLUCOSE, CAPILLARY
GLUCOSE-CAPILLARY: 92 mg/dL (ref 70–99)
Glucose-Capillary: 98 mg/dL (ref 70–99)
Glucose-Capillary: 106 mg/dL — ABNORMAL HIGH (ref 70–99)
Glucose-Capillary: 84 mg/dL (ref 70–99)

## 2013-03-13 MED ORDER — METOPROLOL TARTRATE 50 MG PO TABS
50.0000 mg | ORAL_TABLET | Freq: Two times a day (BID) | ORAL | Status: DC
  Administered 2013-03-13 – 2013-03-18 (×8): 50 mg via ORAL
  Filled 2013-03-13 (×12): qty 1

## 2013-03-13 NOTE — Progress Notes (Signed)
I have seen and examined this patient and agree with the plan of care  [facility] W 03/13/2013, 11:17 AM

## 2013-03-13 NOTE — Progress Notes (Signed)
Subjective: Patient easily arousable without complaint of fever chills or abdominal pain.  Objective: Vital signs in last 24 hours: Temp:  [98.3 F (36.8 C)-99.6 F (37.6 C)] 98.8 F (37.1 C) (01/12 0743) Pulse Rate:  [93-114] 108 (01/12 0743) Resp:  [11-24] 20 (01/12 0743) BP: (133-210)/(68-101) 189/99 mmHg (01/12 0743) SpO2:  [94 %-99 %] 94 % (01/12 0743) Weight:  [95.5 kg (210 lb 8.6 oz)] 95.5 kg (210 lb 8.6 oz) (01/12 0359) Weight change: 1.9 kg (4 lb 3 oz)    Intake/Output from previous day: 01/11 0701 - 01/12 0700 In: 240 [P.O.:240] Out: 225 [Urine:225] Intake/Output this shift: Total I/O In: -  Out: 175 [Urine:175]  General appearance: alert Resp: moderate air movement bilateral Cardio: tachycardia with frequent ectopy GI: soft, positive bowel sounds PD catheter in place there is ecchymosis anteriorly Extremities: trace edema Neurologic: Grossly normal  Lab Results:  Results for orders placed during the hospital encounter of 03/11/13 (from the past 24 hour(s))  GLUCOSE, CAPILLARY     Status: Abnormal   Collection Time    03/12/13 11:23 AM      Result Value Range   Glucose-Capillary 108 (*) 70 - 99 mg/dL  GLUCOSE, CAPILLARY     Status: Abnormal   Collection Time    03/12/13  4:59 PM      Result Value Range   Glucose-Capillary 115 (*) 70 - 99 mg/dL  GLUCOSE, CAPILLARY     Status: Abnormal   Collection Time    03/12/13 10:31 PM      Result Value Range   Glucose-Capillary 103 (*) 70 - 99 mg/dL   Comment 1 Notify RN    RENAL FUNCTION PANEL     Status: Abnormal   Collection Time    03/13/13  3:35 AM      Result Value Range   Sodium 143  137 - 147 mEq/L   Potassium 4.9  3.7 - 5.3 mEq/L   Chloride 105  96 - 112 mEq/L   CO2 19  19 - 32 mEq/L   Glucose, Bld 85  70 - 99 mg/dL   BUN 88 (*) 6 - 23 mg/dL   Creatinine, Ser 11.03 (*) 0.50 - 1.35 mg/dL   Calcium 9.0  8.4 - 10.5 mg/dL   Phosphorus 8.6 (*) 2.3 - 4.6 mg/dL   Albumin 2.8 (*) 3.5 - 5.2 g/dL   GFR  calc non Af Amer 4 (*) >90 mL/min   GFR calc Af Amer 4 (*) >90 mL/min  GLUCOSE, CAPILLARY     Status: None   Collection Time    03/13/13  7:44 AM      Result Value Range   Glucose-Capillary 92  70 - 99 mg/dL      Studies/Results: Ct Head Wo Contrast  03/11/2013   CLINICAL DATA:  Mental status changes.  EXAM: CT HEAD WITHOUT CONTRAST  TECHNIQUE: Contiguous axial images were obtained from the base of the skull through the vertex without intravenous contrast.  COMPARISON:  None.  FINDINGS: Mild age related cerebral atrophy, ventriculomegaly and periventricular white matter disease. No extra-axial fluid collections are identified. No CT findings for acute hemispheric infarction an or intracranial hemorrhage. No mass lesions. The brainstem and cerebellum are grossly normal. Moderate vascular calcifications are noted. The globes are intact. Scleral banding is noted on the left. No lens is identified. A small scalp cyst is noted at the occiput.  The bony structures are intact. No skull fracture or bone lesion. The paranasal sinuses and mastoid  air cells are clear.  IMPRESSION: Mild age related cerebral atrophy, ventriculomegaly and periventricular white matter disease. No acute intracranial findings or mass lesions.   Electronically Signed   By: Kalman Jewels M.D.   On: 03/11/2013 10:43    Medications:  Prior to Admission:  Prescriptions prior to admission  Medication Sig Dispense Refill  . allopurinol (ZYLOPRIM) 100 MG tablet Take 100 mg by mouth daily.        Marland Kitchen atorvastatin (LIPITOR) 40 MG tablet Take 40 mg by mouth daily.        . calcitRIOL (ROCALTROL) 0.25 MCG capsule Take 0.25 mcg by mouth daily.        . furosemide (LASIX) 80 MG tablet Take 80 mg by mouth 2 (two) times daily as needed for fluid.       . hydrALAZINE (APRESOLINE) 50 MG tablet Take 50 mg by mouth 2 (two) times daily.      Marland Kitchen losartan (COZAAR) 100 MG tablet Take 100 mg by mouth daily.        . metoprolol (LOPRESSOR) 100 MG  tablet Take 100 mg by mouth 2 (two) times daily.      Marland Kitchen oxyCODONE-acetaminophen (ROXICET) 5-325 MG per tablet Take 1-2 tablets by mouth every 4 (four) hours as needed for severe pain.  50 tablet  0  . ACCU-CHEK COMPACT STRIPS test strip       . aspirin 325 MG EC tablet Take 325 mg by mouth daily.       Scheduled: . allopurinol  100 mg Oral Daily  . amLODipine  5 mg Oral Daily  . calcitRIOL  0.25 mcg Oral Daily  . metoprolol  25 mg Oral BID   Continuous:  [id number], acetaminophen, cloNIDine, furosemide, morphine injection, ondansetron (ZOFRAN) IV, ondansetron, senna-docusate  Assessment/Plan: Chronic kidney disease stage V presenting with confusion mental status change and worsening renal function. Patient is now status post HD x1. Level of confusion appears to have improved dramatically. So far no signs of CVA no signs of infection. Ecchymosis of abdominal wall related to recent repair of umbilical hernia and placement of PD catheter Diabetes no recurrent hypoglycemia Hypertension, blood pressure elevated, add back home meds as clinical course allows   LOS: 2 days   Lorann Tani D 03/13/2013, 9:57 AM

## 2013-03-13 NOTE — Progress Notes (Signed)
Patient ID: Sean Best, male   DOB: 16-Nov-1933, 80 y.o.   MRN: [id number] [facility]  Patient name: Sean Best Medical record number: [id number] Date of birth: 1933/11/22 Age: 80 y.o. Gender: male  Primary Care Provider: [name], MD  Subjective: Pt states he is feeling "rough." Complains of lower abdominal pain. Uncertain if he has had anything to eat or drink.   Objective: Temp:  [98.3 F (36.8 C)-99.6 F (37.6 C)] 98.5 F (36.9 C) (01/12 0359) Pulse Rate:  [93-114] 97 (01/12 0500) Resp:  [11-30] 22 (01/12 0500) BP: (133-210)/(68-101) 156/80 mmHg (01/12 0500) SpO2:  [94 %-99 %] 96 % (01/12 0500) Weight:  [210 lb 8.6 oz (95.5 kg)] 210 lb 8.6 oz (95.5 kg) (01/12 0359)  Intake/Output Summary (Last 24 hours) at 03/13/13 0742 Last data filed at 03/13/13 0359  Gross per 24 hour  Intake    240 ml  Output    225 ml  Net     15 ml   ~25 cc dark yellow urine in foley bag Physical Exam: General: Obese AAM. Awake. Weak. Responds to questions today.  Cardiovascular: RRR. Mild ST at times.  Respiratory: Very mild crackles L>R. No wheeze.  Abdomen: Soft. Large ecchymosis periumbilical/ lower abd.  recently-placed PD cath site  Extremities: +2 edema bilaterally LE, right UE with AVF at wrist +bruit Neuro: Awake. Weak. Oriented to person and birth date only.  + asterixis  Laboratory:  Recent Labs Lab 03/11/13 0911  WBC 6.8  HGB 9.6*  HCT 30.0*  PLT 218    Recent Labs Lab 03/11/13 0911 03/12/13 0345 03/13/13 0335  NA 140 141 143  K 6.0* 4.8 4.9  CL 103 103 105  CO2 17* 20 19  BUN 98* 74* 88*  CREATININE 12.78* 10.50* 11.03*  CALCIUM 9.4 9.2 9.0  PROT 7.0  --   --   BILITOT 0.3  --   --   ALKPHOS 49  --   --   ALT 5  --   --   AST 11  --   --   GLUCOSE 90 93 85      Imaging/Diagnostic Tests: Ct Head Wo Contrast  03/11/2013   CLINICAL DATA:  Mental status changes.  EXAM: CT HEAD WITHOUT CONTRAST  TECHNIQUE: Contiguous axial images were  obtained from the base of the skull through the vertex without intravenous contrast.  COMPARISON:  None.  FINDINGS: Mild age related cerebral atrophy, ventriculomegaly and periventricular white matter disease. No extra-axial fluid collections are identified. No CT findings for acute hemispheric infarction an or intracranial hemorrhage. No mass lesions. The brainstem and cerebellum are grossly normal. Moderate vascular calcifications are noted. The globes are intact. Scleral banding is noted on the left. No lens is identified. A small scalp cyst is noted at the occiput.  The bony structures are intact. No skull fracture or bone lesion. The paranasal sinuses and mastoid air cells are clear.  IMPRESSION: Mild age related cerebral atrophy, ventriculomegaly and periventricular white matter disease. No acute intracranial findings or mass lesions.   Electronically Signed   By: Kalman Jewels M.D.   On: 03/11/2013 10:43   Dg Chest Port 1 View  03/11/2013   CLINICAL DATA:  Syncope, altered mental status  EXAM: PORTABLE CHEST - 1 VIEW  COMPARISON:  03/01/2013  FINDINGS: Cardiomegaly. No acute infiltrate or pulmonary edema. Mild basilar atelectasis.  IMPRESSION: Cardiomegaly. No acute infiltrate or pulmonary edema. Mild basilar atelectasis.   Electronically Signed   By: Julien Girt  Pop M.D.   On: 03/11/2013 09:21   Assessment/Plan  1. CKD stage V with acute worsening of Cr, uremia , hyperkalemia - not yet on dialysis but essentially ESRD, POD#6 from hernia repair with PD cath placement. Cr increased 9 > 12 over 5 days, with worsening confusion / AMS. Acute insult possibly secondary to poor PO intake + ARB use at home, +/- underperfusion during surgery. Creatine increased yesterday without HD.   2. Acute encephalopathy - most likely secondary to acute on CKD, with normal ammonia, normal lactic acid, nothing acute on CT. More alert today, but not oriented. Still confused. 3. DM  4. HTN, HLD - Elevated today  5. Eccymosis  of abdominal wall / umbilical hernia - POD #6. Management by Dr. Excell Seltzer. 6. Anemia - acute on chronic, likely related to recent surgery, some bleeding from wound, but appears stable    Plan:  - monitor Hb and renal function daily - Continue to hold ARB - Avoid sedating medications  - Strict I/O - renal diet - Schedule HD for tomorrow 1/13.  Needs HD at least in the short term; may need for several weeks until hernia operation site heals to be able to use PD at home.  Ma Hillock, DO 03/13/2013, 7:42 AM

## 2013-03-13 NOTE — Progress Notes (Signed)
Utilization review completed.  

## 2013-03-13 NOTE — Progress Notes (Signed)
Pt c/o inability to void bladder with discomfort. Bladder scan revealed 915cc of urine in bladder. Dr. Delfina Redwood called and new order received to place foley. 101fr foley placed, pt tolerated procedure well. Will continue to monitor.

## 2013-03-13 NOTE — Progress Notes (Signed)
03/13/2013 1:13 PM  Added conversion set to patient PD cath extension without difficulty.  First attempt at flush with 100cc  1.5% dialysate revealed rapid fill with very sluggish return of fluid.  I wasn't quite sure if I was able to receive fluid back out of catheter, so procedure was repeated a total of three times to make sure the cath was draining fluid appropriately.  About 300cc of dialysate was instilled total, with about 300cc of dialysate returned total.  Again, return appears sluggish but apparently still effective.  Return dialysate was pink tinged, with a couple of strands of fibrin noted in the fluid as well.  Fluid did appear clear otherwise.  Pt tolerated flush procedure well, catheter remains intact.  Will continue to monitor. Princella Pellegrini

## 2013-03-14 LAB — RENAL FUNCTION PANEL
Albumin: 2.8 g/dL — ABNORMAL LOW (ref 3.5–5.2)
BUN: 96 mg/dL — ABNORMAL HIGH (ref 6–23)
CALCIUM: 9.3 mg/dL (ref 8.4–10.5)
CO2: 20 mEq/L (ref 19–32)
CREATININE: 11.26 mg/dL — AB (ref 0.50–1.35)
Chloride: 108 mEq/L (ref 96–112)
GFR calc Af Amer: 4 mL/min — ABNORMAL LOW (ref >90)
GFR calc non Af Amer: 4 mL/min — ABNORMAL LOW (ref >90)
GLUCOSE: 89 mg/dL (ref 70–99)
PHOSPHORUS: 8.7 mg/dL — AB (ref 2.3–4.6)
Potassium: 5.1 mEq/L (ref 3.7–5.3)
SODIUM: 145 meq/L (ref 137–147)

## 2013-03-14 LAB — HEMOGLOBIN AND HEMATOCRIT, BLOOD
HCT: 29.2 % — ABNORMAL LOW (ref 39.0–52.0)
Hemoglobin: 9.3 g/dL — ABNORMAL LOW (ref 13.0–17.0)

## 2013-03-14 LAB — GLUCOSE, CAPILLARY
GLUCOSE-CAPILLARY: 137 mg/dL — AB (ref 70–99)
Glucose-Capillary: 82 mg/dL (ref 70–99)
Glucose-Capillary: 101 mg/dL — ABNORMAL HIGH (ref 70–99)

## 2013-03-14 MED ORDER — HYDRALAZINE HCL 50 MG PO TABS
50.0000 mg | ORAL_TABLET | Freq: Two times a day (BID) | ORAL | Status: DC
  Administered 2013-03-14 – 2013-03-17 (×6): 50 mg via ORAL
  Filled 2013-03-14 (×11): qty 1

## 2013-03-14 NOTE — Progress Notes (Signed)
Patient ID: Sean Best, male   DOB: 09-18-1933, 80 y.o.   MRN: [id number]    Subjective: Alert this AM, appropriate and oriented, knows me.  Denies abdominal complaints  Objective: Vital signs in last 24 hours: Temp:  [97.9 F (36.6 C)-99 F (37.2 C)] 98.5 F (36.9 C) (01/13 0752) Pulse Rate:  [88-107] 91 (01/13 0752) Resp:  [13-18] 15 (01/13 0752) BP: (135-190)/(72-95) 189/87 mmHg (01/13 0752) SpO2:  [88 %-96 %] 95 % (01/13 0752) Weight:  [204 lb 5.9 oz (92.7 kg)] 204 lb 5.9 oz (92.7 kg) (01/13 0456)    Intake/Output from previous day: 01/12 0701 - 01/13 0700 In: 360 [P.O.:360] Out: 1750 [Urine:1750] Intake/Output this shift: Total I/O In: -  Out: 125 [Urine:125]  General appearance: alert, cooperative and no distress GI: normal findings: soft, non-tender Incision/Wound: Ecchymosis improving, wounds and cateter site OK without infection  Lab Results:   Recent Labs  03/14/13 0236  HGB 9.3*  HCT 29.2*   BMET  Recent Labs  03/13/13 0335 03/14/13 0236  NA 143 145  K 4.9 5.1  CL 105 108  CO2 19 20  GLUCOSE 85 89  BUN 88* 96*  CREATININE 11.03* 11.26*  CALCIUM 9.0 9.3     Studies/Results: No results found.  Anti-infectives: Anti-infectives   None      Assessment/Plan: Much improved UH wound and PD cath site OK PD cath flushing OK.  Changed to 500cc weekly    LOS: 3 days    Craig Wisnewski T 03/14/2013

## 2013-03-14 NOTE — Progress Notes (Signed)
I have seen and examined this patient and agree with the plan of care  [facility] W 03/14/2013, 11:07 AM

## 2013-03-14 NOTE — Progress Notes (Signed)
Patient ID: Alverto Nonaka, male   DOB: April 19, 1933, 80 y.o.   MRN: [id number] [facility]  Patient name: Sean Best Medical record number: [id number] Date of birth: 05-25-1933 Age: 80 y.o. Gender: male  Primary Care Provider: [name], MD  Subjective: Pt states he is feeling better than yesterday. Thinks he was just "out of it" yesterday. Pt had condom cath removed, and was unable to void on his own after PD flush,bladder scan reveled 915 cc urine in bladder and foley was replaced with 950 return.  PD, from notes, appears to have sluggish return and needed 3 flushes.   Objective: Temp:  [97.9 F (36.6 C)-99 F (37.2 C)] 98.5 F (36.9 C) (01/13 0752) Pulse Rate:  [88-107] 88 (01/13 0543) Resp:  [13-18] 16 (01/13 0543) BP: (135-190)/(72-95) 135/79 mmHg (01/13 0543) SpO2:  [88 %-96 %] 91 % (01/13 0543) Weight:  [204 lb 5.9 oz (92.7 kg)] 204 lb 5.9 oz (92.7 kg) (01/13 0456)  Intake/Output Summary (Last 24 hours) at 03/14/13 0828 Last data filed at 03/14/13 0752  Gross per 24 hour  Intake    240 ml  Output   1700 ml  Net  -1460 ml  PD cath was flushed x3 yesterday (~300cc) ~35 cc in foley bag appears bloody with large amount of sediment.  Physical Exam: General: Obese AAM. Awake. Weak. Responds to questions today.  Cardiovascular: RRR. Mild ST at times.  Respiratory: Very mild crackles R>L No wheeze.  Abdomen: Soft. ecchymosis periumbilical/ lower abd.  recently-placed PD cath site and hernia repair Extremities: +2 edema bilaterally LE, right UE with AVF at wrist +bruit Neuro: Awake. Weak. Oriented x3.  + asterixis  Laboratory:  Recent Labs Lab 03/11/13 0911 03/14/13 0236  WBC 6.8  --   HGB 9.6* 9.3*  HCT 30.0* 29.2*  PLT 218  --     Recent Labs Lab 03/11/13 0911 03/12/13 0345 03/13/13 0335 03/14/13 0236  NA 140 141 143 145  K 6.0* 4.8 4.9 5.1  CL 103 103 105 108  CO2 17* 20 19 20   BUN 98* 74* 88* 96*  CREATININE 12.78* 10.50* 11.03* 11.26*   CALCIUM 9.4 9.2 9.0 9.3  PROT 7.0  --   --   --   BILITOT 0.3  --   --   --   ALKPHOS 49  --   --   --   ALT 5  --   --   --   AST 11  --   --   --   GLUCOSE 90 93 85 89      Imaging/Diagnostic Tests: No results found. Assessment/Plan  1. CKD stage V with acute worsening of Cr, uremia , hyperkalemia - not yet on dialysis but essentially ESRD, POD#6 from hernia repair with PD cath placement. Cr increased 9 > 12 over 5 days, with worsening confusion / AMS. Acute insult possibly secondary to poor PO intake + ARB use at home, +/- underperfusion during surgery. Creatine and potassium increased over the past two days without HD.   2. Acute encephalopathy - most likely secondary to acute on CKD, with normal ammonia, normal lactic acid, nothing acute on CT. More alert today, but not oriented. Still confused. 3. DM  4. HTN, HLD -  5. Eccymosis of abdominal wall / umbilical hernia - Management by Dr. Excell Seltzer. 6. Anemia - acute on chronic, likely related to recent surgery, some bleeding from wound, but appears stable. Baseline Hb ~9-10.   Plan:  - monitor Hb  and renal function daily - Continue to hold ARB - Avoid sedating medications  - Strict I/O - renal diet - Schedule HD for today.  Needs HD at least in the short term; may need for several weeks until hernia operation site heals to be able to use PD at home.  Ma Hillock, DO 03/14/2013, 8:28 AM

## 2013-03-14 NOTE — Progress Notes (Signed)
Subjective: Patient had trouble voiding he has today had increased urine noted on bladder scan. Required Foley catheter. Otherwise no problems. He appears appropriate but a little tired. Denies fever chills. No other problems per nursing  Objective: Vital signs in last 24 hours: Temp:  [97.9 F (36.6 C)-99 F (37.2 C)] 98.5 F (36.9 C) (01/13 0752) Pulse Rate:  [88-107] 97 (01/13 1048) Resp:  [15-21] 21 (01/13 1048) BP: (135-190)/(78-95) 185/86 mmHg (01/13 1048) SpO2:  [88 %-98 %] 98 % (01/13 1048) Weight:  [92.7 kg (204 lb 5.9 oz)] 92.7 kg (204 lb 5.9 oz) (01/13 0456) Weight change: -2.8 kg (-6 lb 2.8 oz)    Intake/Output from previous day: 01/12 0701 - 01/13 0700 In: 360 [P.O.:360] Out: 1750 [Urine:1750] Intake/Output this shift: Total I/O In: 480 [P.O.:480] Out: 125 [Urine:125]  General appearance: alert and cooperative Resp: clear to auscultation bilaterally Cardio: regular rate and rhythm Neurologic: Mental status: patient is alert, appropriate he does appear to have a uremic flap  Lab Results:  Results for orders placed during the hospital encounter of 03/11/13 (from the past 24 hour(s))  GLUCOSE, CAPILLARY     Status: None   Collection Time    03/13/13 11:54 AM      Result Value Range   Glucose-Capillary 98  70 - 99 mg/dL   Comment 1 Notify RN     Comment 2 Documented in Chart    GLUCOSE, CAPILLARY     Status: Abnormal   Collection Time    03/13/13  4:46 PM      Result Value Range   Glucose-Capillary 106 (*) 70 - 99 mg/dL  GLUCOSE, CAPILLARY     Status: None   Collection Time    03/13/13  9:57 PM      Result Value Range   Glucose-Capillary 84  70 - 99 mg/dL  RENAL FUNCTION PANEL     Status: Abnormal   Collection Time    03/14/13  2:36 AM      Result Value Range   Sodium 145  137 - 147 mEq/L   Potassium 5.1  3.7 - 5.3 mEq/L   Chloride 108  96 - 112 mEq/L   CO2 20  19 - 32 mEq/L   Glucose, Bld 89  70 - 99 mg/dL   BUN 96 (*) 6 - 23 mg/dL   Creatinine,  Ser 11.26 (*) 0.50 - 1.35 mg/dL   Calcium 9.3  8.4 - 10.5 mg/dL   Phosphorus 8.7 (*) 2.3 - 4.6 mg/dL   Albumin 2.8 (*) 3.5 - 5.2 g/dL   GFR calc non Af Amer 4 (*) >90 mL/min   GFR calc Af Amer 4 (*) >90 mL/min  HEMOGLOBIN AND HEMATOCRIT, BLOOD     Status: Abnormal   Collection Time    03/14/13  2:36 AM      Result Value Range   Hemoglobin 9.3 (*) 13.0 - 17.0 g/dL   HCT 29.2 (*) 39.0 - 52.0 %  GLUCOSE, CAPILLARY     Status: None   Collection Time    03/14/13  7:53 AM      Result Value Range   Glucose-Capillary 82  70 - 99 mg/dL   Comment 1 Notify RN     Comment 2 Documented in Chart    GLUCOSE, CAPILLARY     Status: Abnormal   Collection Time    03/14/13 11:03 AM      Result Value Range   Glucose-Capillary 137 (*) 70 - 99 mg/dL  Comment 1 Notify RN     Comment 2 Documented in Chart        Studies/Results: No results found.  Medications:  Prior to Admission:  Prescriptions prior to admission  Medication Sig Dispense Refill  . allopurinol (ZYLOPRIM) 100 MG tablet Take 100 mg by mouth daily.        Marland Kitchen atorvastatin (LIPITOR) 40 MG tablet Take 40 mg by mouth daily.        . calcitRIOL (ROCALTROL) 0.25 MCG capsule Take 0.25 mcg by mouth daily.        . furosemide (LASIX) 80 MG tablet Take 80 mg by mouth 2 (two) times daily as needed for fluid.       . hydrALAZINE (APRESOLINE) 50 MG tablet Take 50 mg by mouth 2 (two) times daily.      Marland Kitchen losartan (COZAAR) 100 MG tablet Take 100 mg by mouth daily.        . metoprolol (LOPRESSOR) 100 MG tablet Take 100 mg by mouth 2 (two) times daily.      Marland Kitchen oxyCODONE-acetaminophen (ROXICET) 5-325 MG per tablet Take 1-2 tablets by mouth every 4 (four) hours as needed for severe pain.  50 tablet  0  . ACCU-CHEK COMPACT STRIPS test strip       . aspirin 325 MG EC tablet Take 325 mg by mouth daily.       Scheduled: . allopurinol  100 mg Oral Daily  . amLODipine  5 mg Oral Daily  . calcitRIOL  0.25 mcg Oral Daily  . metoprolol  50 mg Oral BID    Continuous:  [id number], acetaminophen, cloNIDine, furosemide, morphine injection, ondansetron (ZOFRAN) IV, ondansetron, senna-docusate  Assessment/Plan: Chronic kidney disease stage V presenting with confusion, mental status change and worsening renal function now requiring dialysis. Continue treatment per nephrology Ecchymosis of abdominal wall related to recent repair of umbilical hernia and placement of PD catheter  Diabetes no recurrent hypoglycemia  Hypertension, BP improved, continue to resume home medication as clinical course allows I suspect BP will improve after dialysis      LOS: 3 days   Lexton Hidalgo D 03/14/2013, 11:48 AM

## 2013-03-15 LAB — RENAL FUNCTION PANEL
Albumin: 3 g/dL — ABNORMAL LOW (ref 3.5–5.2)
BUN: 65 mg/dL — AB (ref 6–23)
CHLORIDE: 101 meq/L (ref 96–112)
CO2: 24 mEq/L (ref 19–32)
Calcium: 9.1 mg/dL (ref 8.4–10.5)
Creatinine, Ser: 8.2 mg/dL — ABNORMAL HIGH (ref 0.50–1.35)
GFR calc Af Amer: 6 mL/min — ABNORMAL LOW (ref >90)
GFR calc non Af Amer: 6 mL/min — ABNORMAL LOW (ref >90)
GLUCOSE: 103 mg/dL — AB (ref 70–99)
POTASSIUM: 4.3 meq/L (ref 3.7–5.3)
Phosphorus: 6.1 mg/dL — ABNORMAL HIGH (ref 2.3–4.6)
Sodium: 142 mEq/L (ref 137–147)

## 2013-03-15 LAB — GLUCOSE, CAPILLARY
GLUCOSE-CAPILLARY: 101 mg/dL — AB (ref 70–99)
GLUCOSE-CAPILLARY: 93 mg/dL (ref 70–99)
Glucose-Capillary: 123 mg/dL — ABNORMAL HIGH (ref 70–99)
Glucose-Capillary: 121 mg/dL — ABNORMAL HIGH (ref 70–99)
Glucose-Capillary: 141 mg/dL — ABNORMAL HIGH (ref 70–99)

## 2013-03-15 LAB — IRON AND TIBC
Iron: 20 ug/dL — ABNORMAL LOW (ref 42–135)
SATURATION RATIOS: 12 % — AB (ref 20–55)
TIBC: 164 ug/dL — AB (ref 215–435)
UIBC: 144 ug/dL (ref 125–400)

## 2013-03-15 MED ORDER — SIMETHICONE 80 MG PO CHEW
80.0000 mg | CHEWABLE_TABLET | Freq: Four times a day (QID) | ORAL | Status: DC | PRN
  Filled 2013-03-15: qty 1

## 2013-03-15 MED ORDER — WHITE PETROLATUM GEL
Status: AC
  Administered 2013-03-15: 0.2
  Filled 2013-03-15: qty 5

## 2013-03-15 NOTE — Progress Notes (Signed)
Subjective: Patient without new complaints,alert   Objective: Vital signs in last 24 hours: Temp:  [97.8 F (36.6 C)-98.9 F (37.2 C)] 98.4 F (36.9 C) (01/14 0755) Pulse Rate:  [39-105] 90 (01/14 0346) Resp:  [14-36] 20 (01/14 0346) BP: (131-186)/(57-111) 186/94 mmHg (01/14 1023) SpO2:  [94 %-98 %] 94 % (01/14 0346) Weight:  [90.3 kg (199 lb 1.2 oz)-92.1 kg (203 lb 0.7 oz)] 90.3 kg (199 lb 1.2 oz) (01/13 1603) Weight change: -0.6 kg (-1 lb 5.2 oz)    Intake/Output from previous day: 01/13 0701 - 01/14 0700 In: 480 [P.O.:480] Out: 2825 [Urine:725] Intake/Output this shift: Total I/O In: 240 [P.O.:240] Out: 125 [Urine:125]  General appearance: alert and cooperative Resp: clear to auscultation bilaterally Cardio: regular rate and rhythm, S1, S2 normal, no murmur, click, rub or gallop Extremities: extremities normal, atraumatic, no cyanosis or edema  Lab Results:  Results for orders placed during the hospital encounter of 03/11/13 (from the past 24 hour(s))  GLUCOSE, CAPILLARY     Status: Abnormal   Collection Time    03/14/13  4:57 PM      Result Value Range   Glucose-Capillary 101 (*) 70 - 99 mg/dL   Comment 1 Documented in Chart     Comment 2 Notify RN    GLUCOSE, CAPILLARY     Status: Abnormal   Collection Time    03/14/13 10:01 PM      Result Value Range   Glucose-Capillary 101 (*) 70 - 99 mg/dL   Comment 1 Notify RN     Comment 2 Documented in Chart    RENAL FUNCTION PANEL     Status: Abnormal   Collection Time    03/15/13  2:40 AM      Result Value Range   Sodium 142  137 - 147 mEq/L   Potassium 4.3  3.7 - 5.3 mEq/L   Chloride 101  96 - 112 mEq/L   CO2 24  19 - 32 mEq/L   Glucose, Bld 103 (*) 70 - 99 mg/dL   BUN 65 (*) 6 - 23 mg/dL   Creatinine, Ser 8.20 (*) 0.50 - 1.35 mg/dL   Calcium 9.1  8.4 - 10.5 mg/dL   Phosphorus 6.1 (*) 2.3 - 4.6 mg/dL   Albumin 3.0 (*) 3.5 - 5.2 g/dL   GFR calc non Af Amer 6 (*) >90 mL/min   GFR calc Af Amer 6 (*) >90  mL/min  GLUCOSE, CAPILLARY     Status: None   Collection Time    03/15/13  7:50 AM      Result Value Range   Glucose-Capillary 93  70 - 99 mg/dL   Comment 1 Notify RN     Comment 2 Documented in Chart        Studies/Results: No results found.  Medications:  Prior to Admission:  Prescriptions prior to admission  Medication Sig Dispense Refill  . allopurinol (ZYLOPRIM) 100 MG tablet Take 100 mg by mouth daily.        Marland Kitchen atorvastatin (LIPITOR) 40 MG tablet Take 40 mg by mouth daily.        . calcitRIOL (ROCALTROL) 0.25 MCG capsule Take 0.25 mcg by mouth daily.        . furosemide (LASIX) 80 MG tablet Take 80 mg by mouth 2 (two) times daily as needed for fluid.       . hydrALAZINE (APRESOLINE) 50 MG tablet Take 50 mg by mouth 2 (two) times daily.      Marland Kitchen  losartan (COZAAR) 100 MG tablet Take 100 mg by mouth daily.        . metoprolol (LOPRESSOR) 100 MG tablet Take 100 mg by mouth 2 (two) times daily.      Marland Kitchen oxyCODONE-acetaminophen (ROXICET) 5-325 MG per tablet Take 1-2 tablets by mouth every 4 (four) hours as needed for severe pain.  50 tablet  0  . ACCU-CHEK COMPACT STRIPS test strip       . aspirin 325 MG EC tablet Take 325 mg by mouth daily.       Scheduled: . allopurinol  100 mg Oral Daily  . amLODipine  5 mg Oral Daily  . calcitRIOL  0.25 mcg Oral Daily  . hydrALAZINE  50 mg Oral BID  . metoprolol  50 mg Oral BID   Continuous:   Assessment/Plan: Chronic kidney disease stage V presenting with confusion, mental status change and worsening renal function now requiring dialysis. Continue treatment per nephrology . Much more alert Ecchymosis of abdominal wall related to recent repair of umbilical hernia and placement of PD catheter  Diabetes no recurrent hypoglycemia  Hypertension, BP improved, continue to resume home medication as clinical course allows I suspect BP will improve after dialysis Bladder outlet obstruction, patient denies any previous problems with this in the  past. DC Foley, consider Flomax, if recurrent problem urology  Evaluation.    Patient appears stable for a medical floor   LOS: 4 days   Adrion Menz D 03/15/2013, 11:08 AM

## 2013-03-15 NOTE — Care Management Note (Signed)
    Page 1 of 1   03/15/2013     2:43:26 PM   CARE MANAGEMENT NOTE 03/15/2013  Patient:  Sean Best, Sean Best   Account Number:  [id number]  Date Initiated:  03/13/2013  Documentation initiated by:  Marvetta Gibbons  Subjective/Objective Assessment:   Pt admitted AMS, Hyperkalemia: recent hernia repair- Chronic kidney disease stage V, likely now end-stage     Action/Plan:   PTA pt lived at home with spouse- NCM to follow for d/c needs as pt progresses   Anticipated DC Date:  03/20/2013   Anticipated DC Plan:        DC Planning Services  CM consult      Choice offered to / List presented to:             Status of service:  In process, will continue to follow Medicare Important Message given?   (If response is "NO", the following Medicare IM given date fields will be blank) Date Medicare IM given:   Date Additional Medicare IM given:    Discharge Disposition:    Per UR Regulation:  Reviewed for med. necessity/level of care/duration of stay  If discussed at [place] of Stay Meetings, dates discussed:    Comments:  03/15/13- 1430- Marvetta Gibbons RN, BSN [phone] Pt with recent hernia repair and PD cath placement- Chronic kidney disease stage V presenting with confusion, mental status change and worsening renal function now requiring dialysis-- will need PT/OT evals to assist in d/c planning ST[facility]SNF vs home with [facility]

## 2013-03-15 NOTE — H&P (Addendum)
Report called to Gwenlyn Perking, RN,  and daughter notified of patient being transferred to [place] ready bed.

## 2013-03-15 NOTE — Progress Notes (Signed)
Patient ID: Sean Best, male   DOB: August 16, 1933, 80 y.o.   MRN: [id number] Patient medically stable for a floor bed, see previous notes for details

## 2013-03-15 NOTE — Progress Notes (Signed)
[facility] ROUNDING NOTE   Subjective:   Interval History: awake and alert no complaints   Objective:  Vital signs in last 24 hours:  Temp:  [97.8 F (36.6 C)-98.9 F (37.2 C)] 98.4 F (36.9 C) (01/14 1100) Pulse Rate:  [39-105] 90 (01/14 0346) Resp:  [14-36] 20 (01/14 0346) BP: (131-186)/(57-111) 186/94 mmHg (01/14 1023) SpO2:  [94 %-98 %] 94 % (01/14 0346) Weight:  [90.3 kg (199 lb 1.2 oz)-92.1 kg (203 lb 0.7 oz)] 90.3 kg (199 lb 1.2 oz) (01/13 1603)  Weight change: -0.6 kg (-1 lb 5.2 oz) Filed Weights   03/14/13 0456 03/14/13 1318 03/14/13 1603  Weight: 92.7 kg (204 lb 5.9 oz) 92.1 kg (203 lb 0.7 oz) 90.3 kg (199 lb 1.2 oz)    Intake/Output: I/O last 3 completed shifts: In: 480 [P.O.:480] Out: 3350 [Urine:1250; Other:2100]   Intake/Output this shift:  Total I/O In: 240 [P.O.:240] Out: 200 [Urine:200]  General: Obese AAM. Awake. Weak. Responds to questions today.  Cardiovascular: RRR. Mild ST at times.  Respiratory: Very mild crackles R>L No wheeze.  Abdomen: Soft. ecchymosis periumbilical/ lower abd. recently-placed PD cath site and hernia repair  Extremities: +2 edema bilaterally LE, right UE with AVF at wrist +bruit  Neuro: Awake. Weak. Oriented x3. + asterixis    Basic Metabolic Panel:  Recent Labs Lab 03/11/13 0911 03/11/13 1205 03/12/13 0345 03/13/13 0335 03/14/13 0236 03/15/13 0240  NA 140  --  141 143 145 142  K 6.0*  --  4.8 4.9 5.1 4.3  CL 103  --  103 105 108 101  CO2 17*  --  20 19 20 24   GLUCOSE 90  --  93 85 89 103*  BUN 98*  --  74* 88* 96* 65*  CREATININE 12.78*  --  10.50* 11.03* 11.26* 8.20*  CALCIUM 9.4  --  9.2 9.0 9.3 9.1  MG  --  2.1  --   --   --   --   PHOS  --  7.8* 6.9* 8.6* 8.7* 6.1*    Liver Function Tests:  Recent Labs Lab 03/11/13 0911 03/12/13 0345 03/13/13 0335 03/14/13 0236 03/15/13 0240  AST 11  --   --   --   --   ALT 5  --   --   --   --   ALKPHOS 49  --   --   --   --   BILITOT 0.3  --    --   --   --   PROT 7.0  --   --   --   --   ALBUMIN 3.5 3.1* 2.8* 2.8* 3.0*   No results found for this basename: LIPASE, AMYLASE,  in the last 168 hours  Recent Labs Lab 03/11/13 1205  AMMONIA 31    CBC:  Recent Labs Lab 03/11/13 0911 03/14/13 0236  WBC 6.8  --   NEUTROABS 4.3  --   HGB 9.6* 9.3*  HCT 30.0* 29.2*  MCV 94.0  --   PLT 218  --     Cardiac Enzymes:  Recent Labs Lab 03/11/13 1205  CKTOTAL 278*    BNP: No components found with this basename: POCBNP,   CBG:  Recent Labs Lab 03/14/13 1103 03/14/13 1657 03/14/13 2201 03/15/13 0750 03/15/13 1106  GLUCAP 137* 101* 101* 93 123*    Microbiology: Results for orders placed during the hospital encounter of 03/11/13  CULTURE, BLOOD (ROUTINE X 2)     Status: None  Collection Time    03/11/13  9:00 AM      Result Value Range Status   Specimen Description BLOOD RIGHT ARM   Final   Special Requests BOTTLES DRAWN AEROBIC AND ANAEROBIC 10CC   Final   Culture  Setup Time     Final   Value: 03/11/2013 17:48     Performed at [facility]   Culture     Final   Value:        BLOOD CULTURE RECEIVED NO GROWTH TO DATE CULTURE WILL BE HELD FOR 5 DAYS BEFORE ISSUING A FINAL NEGATIVE REPORT     Performed at [facility]   Report Status PENDING   Incomplete  CULTURE, BLOOD (ROUTINE X 2)     Status: None   Collection Time    03/11/13  9:10 AM      Result Value Range Status   Specimen Description BLOOD RIGHT HAND   Final   Special Requests BOTTLES DRAWN AEROBIC ONLY 10CC   Final   Culture  Setup Time     Final   Value: 03/11/2013 17:48     Performed at [facility]   Culture     Final   Value:        BLOOD CULTURE RECEIVED NO GROWTH TO DATE CULTURE WILL BE HELD FOR 5 DAYS BEFORE ISSUING A FINAL NEGATIVE REPORT     Performed at [facility]   Report Status PENDING   Incomplete  URINE CULTURE     Status: None   Collection Time    03/11/13 11:23 AM      Result Value Range  Status   Specimen Description URINE, CATHETERIZED   Final   Special Requests NONE   Final   Culture  Setup Time     Final   Value: 03/11/2013 17:46     Performed at [place]     Final   Value: NO GROWTH     Performed at [facility]   Culture     Final   Value: NO GROWTH     Performed at [facility]   Report Status 03/12/2013 FINAL   Final  MRSA PCR SCREENING     Status: None   Collection Time    03/12/13  2:50 AM      Result Value Range Status   MRSA by PCR NEGATIVE  NEGATIVE Final   Comment:            The GeneXpert MRSA Assay (FDA     approved for NASAL specimens     only), is one component of a     comprehensive MRSA colonization     surveillance program. It is not     intended to diagnose MRSA     infection nor to guide or     monitor treatment for     MRSA infections.    Coagulation Studies: No results found for this basename: LABPROT, INR,  in the last 72 hours  Urinalysis: No results found for this basename: COLORURINE, APPERANCEUR, LABSPEC, PHURINE, GLUCOSEU, HGBUR, BILIRUBINUR, KETONESUR, PROTEINUR, UROBILINOGEN, NITRITE, LEUKOCYTESUR,  in the last 72 hours    Imaging: No results found.   Medications:     . allopurinol  100 mg Oral Daily  . amLODipine  5 mg Oral Daily  . calcitRIOL  0.25 mcg Oral Daily  . hydrALAZINE  50 mg Oral BID  . metoprolol  50 mg Oral BID  acetaminophen, acetaminophen, cloNIDine, furosemide, morphine injection, ondansetron (ZOFRAN) IV, ondansetron, senna-docusate  Assessment/ Plan:  1. CKD stage V ESRD TTS  CLIP in process 2. Acute encephalopathy - most likely secondary to acute on CKD, with normal ammonia, normal lactic acid, nothing acute on CT. More alert today, but not oriented.   3. DM  4. HTN, HLD -  5. Eccymosis of abdominal wall / umbilical hernia - Management by Dr. Excell Seltzer.  6. Anemia - acute on chronic, likely related to recent surgery, some bleeding from wound, but  appears stable. Baseline Hb ~9-10. Check iron stores 7. HPT will check PTH     LOS: 4 Chrystian Ressler W @TODAY @11 :50 AM

## 2013-03-16 LAB — CBC
HCT: 28.8 % — ABNORMAL LOW (ref 39.0–52.0)
Hemoglobin: 9.2 g/dL — ABNORMAL LOW (ref 13.0–17.0)
MCH: 29.8 pg (ref 26.0–34.0)
MCHC: 31.9 g/dL (ref 30.0–36.0)
MCV: 93.2 fL (ref 78.0–100.0)
PLATELETS: 260 10*3/uL (ref 150–400)
RBC: 3.09 MIL/uL — AB (ref 4.22–5.81)
RDW: 14.9 % (ref 11.5–15.5)
WBC: 8.3 10*3/uL (ref 4.0–10.5)

## 2013-03-16 LAB — RENAL FUNCTION PANEL
Albumin: 2.9 g/dL — ABNORMAL LOW (ref 3.5–5.2)
BUN: 78 mg/dL — ABNORMAL HIGH (ref 6–23)
CALCIUM: 9.1 mg/dL (ref 8.4–10.5)
CO2: 23 mEq/L (ref 19–32)
Chloride: 101 mEq/L (ref 96–112)
Creatinine, Ser: 9.19 mg/dL — ABNORMAL HIGH (ref 0.50–1.35)
GFR calc Af Amer: 6 mL/min — ABNORMAL LOW (ref >90)
GFR, EST NON AFRICAN AMERICAN: 5 mL/min — AB (ref >90)
GLUCOSE: 94 mg/dL (ref 70–99)
PHOSPHORUS: 6.8 mg/dL — AB (ref 2.3–4.6)
POTASSIUM: 4.1 meq/L (ref 3.7–5.3)
SODIUM: 142 meq/L (ref 137–147)

## 2013-03-16 LAB — GLUCOSE, CAPILLARY
GLUCOSE-CAPILLARY: 161 mg/dL — AB (ref 70–99)
Glucose-Capillary: 101 mg/dL — ABNORMAL HIGH (ref 70–99)
Glucose-Capillary: 131 mg/dL — ABNORMAL HIGH (ref 70–99)

## 2013-03-16 LAB — PARATHYROID HORMONE, INTACT (NO CA): PTH: 280.8 pg/mL — AB (ref 14.0–72.0)

## 2013-03-16 MED ORDER — LIDOCAINE-PRILOCAINE 2.5-2.5 % EX CREA: 1.0000 "application " | TOPICAL_CREAM | CUTANEOUS | Status: DC | PRN

## 2013-03-16 MED ORDER — ALTEPLASE 2 MG IJ SOLR
2.0000 mg | Freq: Once | INTRAMUSCULAR | Status: DC | PRN
  Filled 2013-03-16: qty 2

## 2013-03-16 MED ORDER — SODIUM CHLORIDE 0.9 % IV SOLN
100.0000 mL | INTRAVENOUS | Status: DC | PRN
Start: 2013-03-16 — End: 2013-03-16

## 2013-03-16 MED ORDER — NEPRO/CARBSTEADY PO LIQD: 237.0000 mL | ORAL | Status: DC | PRN

## 2013-03-16 MED ORDER — SODIUM CHLORIDE 0.9 % IV SOLN: 100.0000 mL | INTRAVENOUS | Status: DC | PRN

## 2013-03-16 MED ORDER — SODIUM CHLORIDE 0.9 % IV SOLN
125.0000 mg | INTRAVENOUS | Status: DC
  Administered 2013-03-16 – 2013-03-18 (×2): 125 mg via INTRAVENOUS
  Filled 2013-03-16 (×3): qty 10

## 2013-03-16 MED ORDER — PENTAFLUOROPROP-TETRAFLUOROETH EX AERO: 1.0000 "application " | INHALATION_SPRAY | CUTANEOUS | Status: DC | PRN

## 2013-03-16 MED ORDER — HEPARIN SODIUM (PORCINE) 1000 UNIT/ML DIALYSIS
1000.0000 [IU] | INTRAMUSCULAR | Status: DC | PRN
  Filled 2013-03-16: qty 1

## 2013-03-16 MED ORDER — LIDOCAINE HCL (PF) 1 % IJ SOLN: 5.0000 mL | INTRAMUSCULAR | Status: DC | PRN

## 2013-03-16 NOTE — Progress Notes (Signed)
Subjective: No new complaints today. Case discussed with daughter in detail, case discussed with nephrology, trying to obtain dialysis spot for patient.  Objective: Vital signs in last 24 hours: Temp:  [98.1 F (36.7 C)-99.2 F (37.3 C)] 99.2 F (37.3 C) (01/15 0745) Pulse Rate:  [84-97] 97 (01/15 0745) Resp:  [17-28] 18 (01/15 0745) BP: (143-160)/(81-90) 150/82 mmHg (01/15 0745) SpO2:  [96 %-99 %] 97 % (01/15 0745) Weight:  [89.585 kg (197 lb 8 oz)] 89.585 kg (197 lb 8 oz) (01/14 2119) Weight change: -2.515 kg (-5 lb 8.7 oz)    Intake/Output from previous day: 01/14 0701 - 01/15 0700 In: 240 [P.O.:240] Out: 650 [Urine:650] Intake/Output this shift: Total I/O In: 240 [P.O.:240] Out: 160 [Urine:160]  General appearance: alert and cooperative Resp: clear to auscultation bilaterally Cardio: regular rate and rhythm, S1, S2 normal, no murmur, click, rub or gallop Extremities: no clubbing no edema, AV fistula left upper extremity with good thrill  Lab Results:  Results for orders placed during the hospital encounter of 03/11/13 (from the past 24 hour(s))  GLUCOSE, CAPILLARY     Status: Abnormal   Collection Time    03/15/13 11:06 AM      Result Value Range   Glucose-Capillary 123 (*) 70 - 99 mg/dL   Comment 1 Notify RN     Comment 2 Documented in Chart    IRON AND TIBC     Status: Abnormal   Collection Time    03/15/13 12:32 PM      Result Value Range   Iron 20 (*) 42 - 135 ug/dL   TIBC 164 (*) 215 - 435 ug/dL   Saturation Ratios 12 (*) 20 - 55 %   UIBC 144  125 - 400 ug/dL  GLUCOSE, CAPILLARY     Status: Abnormal   Collection Time    03/15/13  5:30 PM      Result Value Range   Glucose-Capillary 121 (*) 70 - 99 mg/dL  GLUCOSE, CAPILLARY     Status: Abnormal   Collection Time    03/15/13  9:49 PM      Result Value Range   Glucose-Capillary 141 (*) 70 - 99 mg/dL  RENAL FUNCTION PANEL     Status: Abnormal   Collection Time    03/16/13  4:11 AM      Result Value  Range   Sodium 142  137 - 147 mEq/L   Potassium 4.1  3.7 - 5.3 mEq/L   Chloride 101  96 - 112 mEq/L   CO2 23  19 - 32 mEq/L   Glucose, Bld 94  70 - 99 mg/dL   BUN 78 (*) 6 - 23 mg/dL   Creatinine, Ser 9.19 (*) 0.50 - 1.35 mg/dL   Calcium 9.1  8.4 - 10.5 mg/dL   Phosphorus 6.8 (*) 2.3 - 4.6 mg/dL   Albumin 2.9 (*) 3.5 - 5.2 g/dL   GFR calc non Af Amer 5 (*) >90 mL/min   GFR calc Af Amer 6 (*) >90 mL/min  CBC     Status: Abnormal   Collection Time    03/16/13  4:12 AM      Result Value Range   WBC 8.3  4.0 - 10.5 K/uL   RBC 3.09 (*) 4.22 - 5.81 MIL/uL   Hemoglobin 9.2 (*) 13.0 - 17.0 g/dL   HCT 28.8 (*) 39.0 - 52.0 %   MCV 93.2  78.0 - 100.0 fL   MCH 29.8  26.0 - 34.0 pg  MCHC 31.9  30.0 - 36.0 g/dL   RDW 14.9  11.5 - 15.5 %   Platelets 260  150 - 400 K/uL  GLUCOSE, CAPILLARY     Status: Abnormal   Collection Time    03/16/13  7:42 AM      Result Value Range   Glucose-Capillary 101 (*) 70 - 99 mg/dL      Studies/Results: No results found.  Medications:  Prior to Admission:  Prescriptions prior to admission  Medication Sig Dispense Refill  . allopurinol (ZYLOPRIM) 100 MG tablet Take 100 mg by mouth daily.        Marland Kitchen atorvastatin (LIPITOR) 40 MG tablet Take 40 mg by mouth daily.        . calcitRIOL (ROCALTROL) 0.25 MCG capsule Take 0.25 mcg by mouth daily.        . furosemide (LASIX) 80 MG tablet Take 80 mg by mouth 2 (two) times daily as needed for fluid.       . hydrALAZINE (APRESOLINE) 50 MG tablet Take 50 mg by mouth 2 (two) times daily.      Marland Kitchen losartan (COZAAR) 100 MG tablet Take 100 mg by mouth daily.        . metoprolol (LOPRESSOR) 100 MG tablet Take 100 mg by mouth 2 (two) times daily.      Marland Kitchen oxyCODONE-acetaminophen (ROXICET) 5-325 MG per tablet Take 1-2 tablets by mouth every 4 (four) hours as needed for severe pain.  50 tablet  0  . ACCU-CHEK COMPACT STRIPS test strip       . aspirin 325 MG EC tablet Take 325 mg by mouth daily.       Scheduled: . allopurinol   100 mg Oral Daily  . amLODipine  5 mg Oral Daily  . calcitRIOL  0.25 mcg Oral Daily  . ferric gluconate (FERRLECIT/NULECIT) IV  125 mg Intravenous Q T,Th,Sa-HD  . hydrALAZINE  50 mg Oral BID  . metoprolol  50 mg Oral BID   Continuous:  [id number] chloride, sodium chloride, acetaminophen, acetaminophen, alteplase, cloNIDine, feeding supplement (NEPRO CARB STEADY), furosemide, heparin, lidocaine (PF), lidocaine-prilocaine, morphine injection, ondansetron (ZOFRAN) IV, ondansetron, pentafluoroprop-tetrafluoroeth, senna-docusate, simethicone  Assessment/Plan: Chronic kidney disease stage V presenting with confusion, mental status change and worsening renal function now requiring dialysis. Continue treatment per nephrology . Disposition home when his spot for dialysis is arranged Ecchymosis of abdominal wall related to recent repair of umbilical hernia and placement of PD catheter  Diabetes no recurrent hypoglycemia  Hypertension, BP improved,  Bladder outlet obstruction, patient denies any previous problems with this in the past. DC Foley, consider Flomax, if recurrent problem urology Evaluation.    LOS: 5 days   Leisa Gault D 03/16/2013, 10:59 AM

## 2013-03-16 NOTE — Procedures (Signed)
I was present at this dialysis session. I have reviewed the session itself and made appropriate changes.   Pearson Grippe  MD 03/16/2013, 2:35 PM

## 2013-03-16 NOTE — Progress Notes (Signed)
Pt has new onset of bright pink/red urine. No clots noted. Asymptomatic, without pain or distress at this time. Notified MD. Per MD order, complete bladder irrigation as needed. Will continue to monitor Sean Best

## 2013-03-16 NOTE — Progress Notes (Signed)
Patient ID: Almanzo Redler, male   DOB: 1933/06/09, 80 y.o.   MRN: [id number] [facility]  Patient name: Sean Best Medical record number: [id number] Date of birth: 10/02/1933 Age: 80 y.o. Gender: male  Primary Care Provider: [name], MD  Subjective: Pt is sitting up in chair eating breakfast. He is a accompanied by his daughter today. He states he feels well, but is having pelvic floor pressure and he is having a difficult time telling if he has to urinate or have a BM. he has a foley in place and  he has been unable to to have a BM. He is passing flatus.  Objective: Temp:  [98.1 F (36.7 C)-99.2 F (37.3 C)] 99.2 F (37.3 C) (01/15 0745) Pulse Rate:  [84-97] 97 (01/15 0745) Resp:  [17-28] 18 (01/15 0745) BP: (143-186)/(81-94) 150/82 mmHg (01/15 0745) SpO2:  [96 %-99 %] 97 % (01/15 0745) Weight:  [197 lb 8 oz (89.585 kg)] 197 lb 8 oz (89.585 kg) (01/14 2119)  Intake/Output Summary (Last 24 hours) at 03/16/13 0823 Last data filed at 03/16/13 0503  Gross per 24 hour  Intake    240 ml  Output    525 ml  Net   -285 ml   ~160 cc in foley bag appears pink tinged, no clots currently.   Physical Exam: General: Alert. In bedside chair today. Looks well.  Cardiovascular: RRR.   Respiratory: CTAB Abdomen: Soft. ecchymosis periumbilical/ lower abd.  recently-placed PD cath site and hernia repair Extremities: trace edema bilaterally LE Neuro: Awake. Weak. Oriented x3. No asterixis noted GU: Foley cath in place  Laboratory:  Recent Labs Lab 03/11/13 0911 03/14/13 0236 03/16/13 0412  WBC 6.8  --  8.3  HGB 9.6* 9.3* 9.2*  HCT 30.0* 29.2* 28.8*  PLT 218  --  260    Recent Labs Lab 03/11/13 0911  03/14/13 0236 03/15/13 0240 03/16/13 0411  NA 140  < > 145 142 142  K 6.0*  < > 5.1 4.3 4.1  CL 103  < > 108 101 101  CO2 17*  < > 20 24 23   BUN 98*  < > 96* 65* 78*  CREATININE 12.78*  < > 11.26* 8.20* 9.19*  CALCIUM 9.4  < > 9.3 9.1 9.1  PROT 7.0  --   --    --   --   BILITOT 0.3  --   --   --   --   ALKPHOS 49  --   --   --   --   ALT 5  --   --   --   --   AST 11  --   --   --   --   GLUCOSE 90  < > 89 103* 94  < > = values in this interval not displayed.  Iron: 20 Sat: 12  Imaging/Diagnostic Tests: No results found. Assessment/Plan  1. CKD stage V :ESRD TTS CLIP in process, 80 mg PO lasix BID. Last dialysis Tuesday (2013ml). Diminished UOP.  2. Acute encephalopathy - most likely secondary to acute on CKD, with normal ammonia, normal lactic acid, nothing acute on CT. Resolved.  3. DM  4. HTN, HLD -  5. Eccymosis of abdominal wall / umbilical hernia - Management by Dr. Excell Seltzer. 6. Anemia - acute on chronic, likely related to recent surgery, some bleeding from wound, but appears stable. Baseline Hb ~9-10. Iron stores are low.  7. HPT: PTH in process  Plan:  - monitor Hb and  renal function daily - Continue to hold ARB - Strict I/O - renal diet - Cr rising slowly 9.19 today. Electrolytes look good. Diminished UOP. HD today. Pt being set up for outpt dialysis 3x weekly until able to use PD at home. - Will give IV iron today  [place], DO 03/16/2013, 8:23 AM

## 2013-03-16 NOTE — Progress Notes (Addendum)
Bladder irrigated with 60cc sterile water. Clots present upon irrigation. Urine continues to appear pink tinged. Patient tolerated well. Velora Mediate

## 2013-03-16 NOTE — Progress Notes (Signed)
I have seen and examined this patient and agree with the plan of care [facility] W 03/16/2013, 11:29 AM

## 2013-03-16 NOTE — Progress Notes (Signed)
Utilization review completed.  

## 2013-03-16 NOTE — Progress Notes (Addendum)
Pt had 6 beat run of V. Tach, asymptomatic, without distress. [place] MD made aware. No new orders at this time. Will continue to monitor. Velora Mediate

## 2013-03-17 LAB — RENAL FUNCTION PANEL
Albumin: 2.9 g/dL — ABNORMAL LOW (ref 3.5–5.2)
BUN: 32 mg/dL — ABNORMAL HIGH (ref 6–23)
CHLORIDE: 98 meq/L (ref 96–112)
CO2: 27 mEq/L (ref 19–32)
CREATININE: 5.05 mg/dL — AB (ref 0.50–1.35)
Calcium: 8.8 mg/dL (ref 8.4–10.5)
GFR calc Af Amer: 12 mL/min — ABNORMAL LOW (ref >90)
GFR calc non Af Amer: 10 mL/min — ABNORMAL LOW (ref >90)
GLUCOSE: 88 mg/dL (ref 70–99)
POTASSIUM: 3.7 meq/L (ref 3.7–5.3)
Phosphorus: 4.2 mg/dL (ref 2.3–4.6)
Sodium: 139 mEq/L (ref 137–147)

## 2013-03-17 LAB — CULTURE, BLOOD (ROUTINE X 2)
Culture: NO GROWTH
Culture: NO GROWTH

## 2013-03-17 LAB — GLUCOSE, CAPILLARY
GLUCOSE-CAPILLARY: 108 mg/dL — AB (ref 70–99)
GLUCOSE-CAPILLARY: 116 mg/dL — AB (ref 70–99)
GLUCOSE-CAPILLARY: 114 mg/dL — AB (ref 70–99)
Glucose-Capillary: 123 mg/dL — ABNORMAL HIGH (ref 70–99)

## 2013-03-17 NOTE — Progress Notes (Signed)
Patient ID: Sean Best, male   DOB: 04/21/1933, 80 y.o.   MRN: [id number] [facility]  Patient name: Sean Best Medical record number: [id number] Date of birth: 1933/04/07 Age: 80 y.o. Gender: male  Primary Care Provider: [name], MD  Subjective: Pt is sitting up in chair eating breakfast. Feeling well and improved since yesterday.   Objective: Temp:  [98.4 F (36.9 C)-99.1 F (37.3 C)] 99.1 F (37.3 C) (01/16 0503) Pulse Rate:  [83-103] 88 (01/16 0503) Resp:  [16-21] 18 (01/16 0503) BP: (143-182)/(64-99) 149/79 mmHg (01/16 0503) SpO2:  [95 %-98 %] 95 % (01/16 0503) Weight:  [194 lb 14.2 oz (88.4 kg)-200 lb 6.4 oz (90.9 kg)] 199 lb 1.6 oz (90.311 kg) (01/15 2245)  Intake/Output Summary (Last 24 hours) at 03/17/13 0747 Last data filed at 03/16/13 1901  Gross per 24 hour  Intake    480 ml  Output   2820 ml  Net  -2340 ml    Physical Exam: General: Alert. In bedside chair today. Looks well.  Cardiovascular: RRR.   Respiratory: CTAB Abdomen: Soft. ecchymosis periumbilical/ lower abd.  recently-placed PD cath site and hernia repair Extremities: trace edema bilaterally LE Neuro: AAO x3, no focal deficits. No asterixis noted  Laboratory:  Recent Labs Lab 03/11/13 0911 03/14/13 0236 03/16/13 0412  WBC 6.8  --  8.3  HGB 9.6* 9.3* 9.2*  HCT 30.0* 29.2* 28.8*  PLT 218  --  260    Recent Labs Lab 03/11/13 0911 03/11/13 1205  03/13/13 0335 03/14/13 0236 03/15/13 0240 03/16/13 0411 03/17/13 0440  NA 140  --   < > 143 145 142 142 139  K 6.0*  --   < > 4.9 5.1 4.3 4.1 3.7  CL 103  --   < > 105 108 101 101 98  CO2 17*  --   < > 19 20 24 23 27   GLUCOSE 90  --   < > 85 89 103* 94 88  BUN 98*  --   < > 88* 96* 65* 78* 32*  CREATININE 12.78*  --   < > 11.03* 11.26* 8.20* 9.19* 5.05*  CALCIUM 9.4  --   < > 9.0 9.3 9.1 9.1 8.8  MG  --  2.1  --   --   --   --   --   --   PHOS  --  7.8*  < > 8.6* 8.7* 6.1* 6.8* 4.2  < > = values in this interval not  displayed.   Lab Results  Component Value Date   IRON 20* 03/15/2013   TIBC 164* 03/15/2013     Imaging/Diagnostic Tests: No results found. Assessment/Plan  1. CKD stage V :ESRD TTS CLIP in process, 80 mg PO lasix BID. Last dialysis Thursday (255ml). Diminished UOP continues.  2. Acute encephalopathy - most likely secondary to acute on CKD, with normal ammonia, normal lactic acid, nothing acute on CT. Resolved.  3. DM  4. HTN, HLD  5. Eccymosis of abdominal wall / umbilical hernia - Management by Dr. Excell Seltzer. 6. Anemia - acute on chronic, likely related to recent surgery, some bleeding from wound, but appears stable. Baseline Hb ~9-10. Iron stores are low.  7. HPT: PTH in process  Plan:  1) monitor Hb and renal function daily 2) Continue to hold ARB 3) Strict I/O, renal diet 4) Cr rising slowly 5.05 after HD yesterday. Electrolytes look good. Diminished UOP.  Pt being set up for outpt dialysis 3x weekly until able  to use PD at home. 5) Replete Fe stores as indicated, Phosphate binders as indicated, f/u PTH  Nolon Rod, DO 03/17/2013, 7:47 AM

## 2013-03-17 NOTE — Progress Notes (Signed)
I have seen and examined this patient and agree with the plan of care  .  Linn Clavin W 03/17/2013, 12:10 PM

## 2013-03-17 NOTE — Progress Notes (Signed)
Hemodialysis- Pt has chair at [facility] on [facility] starting Monday January 19th, 2015 at 10am to sign paperwork. Then will resume a Tuesday Thursday Saturday schedule with an 11am chair time. This information was given to patient along with welcome letter.

## 2013-03-17 NOTE — Progress Notes (Signed)
Subjective: No new complaints. Denies any fever chills nausea vomiting. Patient's daughter present, all questions answered, awaiting patient's dialysis slot for outpatient dialysis  Objective: Vital signs in last 24 hours: Temp:  [98.4 F (36.9 C)-99.1 F (37.3 C)] 98.9 F (37.2 C) (01/16 0935) Pulse Rate:  [83-103] 89 (01/16 0935) Resp:  [16-21] 19 (01/16 0935) BP: (136-182)/(64-99) 136/74 mmHg (01/16 0935) SpO2:  [94 %-98 %] 94 % (01/16 0935) Weight:  [88.4 kg (194 lb 14.2 oz)-90.9 kg (200 lb 6.4 oz)] 90.311 kg (199 lb 1.6 oz) (01/15 2245) Weight change: 1.315 kg (2 lb 14.4 oz) Last BM Date:  (prior to admission)  Intake/Output from previous day: 01/15 0701 - 01/16 0700 In: 480 [P.O.:480] Out: 2820 [Urine:320] Intake/Output this shift: Total I/O In: 240 [P.O.:240] Out: -   General appearance: cooperative Resp: clear to auscultation bilaterally Cardio: regular rate and rhythm, S1, S2 normal, no murmur, click, rub or gallop Extremities: extremities normal, atraumatic, no cyanosis or edema  Lab Results:  Results for orders placed during the hospital encounter of 03/11/13 (from the past 24 hour(s))  GLUCOSE, CAPILLARY     Status: Abnormal   Collection Time    03/16/13 11:55 AM      Result Value Range   Glucose-Capillary 131 (*) 70 - 99 mg/dL  GLUCOSE, CAPILLARY     Status: Abnormal   Collection Time    03/16/13 10:37 PM      Result Value Range   Glucose-Capillary 161 (*) 70 - 99 mg/dL  RENAL FUNCTION PANEL     Status: Abnormal   Collection Time    03/17/13  4:40 AM      Result Value Range   Sodium 139  137 - 147 mEq/L   Potassium 3.7  3.7 - 5.3 mEq/L   Chloride 98  96 - 112 mEq/L   CO2 27  19 - 32 mEq/L   Glucose, Bld 88  70 - 99 mg/dL   BUN 32 (*) 6 - 23 mg/dL   Creatinine, Ser 5.05 (*) 0.50 - 1.35 mg/dL   Calcium 8.8  8.4 - 10.5 mg/dL   Phosphorus 4.2  2.3 - 4.6 mg/dL   Albumin 2.9 (*) 3.5 - 5.2 g/dL   GFR calc non Af Amer 10 (*) >90 mL/min   GFR calc Af  Amer 12 (*) >90 mL/min  GLUCOSE, CAPILLARY     Status: Abnormal   Collection Time    03/17/13  8:11 AM      Result Value Range   Glucose-Capillary 108 (*) 70 - 99 mg/dL      Studies/Results: No results found.  Medications:  Prior to Admission:  Prescriptions prior to admission  Medication Sig Dispense Refill  . allopurinol (ZYLOPRIM) 100 MG tablet Take 100 mg by mouth daily.        Marland Kitchen atorvastatin (LIPITOR) 40 MG tablet Take 40 mg by mouth daily.        . calcitRIOL (ROCALTROL) 0.25 MCG capsule Take 0.25 mcg by mouth daily.        . furosemide (LASIX) 80 MG tablet Take 80 mg by mouth 2 (two) times daily as needed for fluid.       . hydrALAZINE (APRESOLINE) 50 MG tablet Take 50 mg by mouth 2 (two) times daily.      Marland Kitchen losartan (COZAAR) 100 MG tablet Take 100 mg by mouth daily.        . metoprolol (LOPRESSOR) 100 MG tablet Take 100 mg by mouth 2 (two) times  daily.      . oxyCODONE-acetaminophen (ROXICET) 5-325 MG per tablet Take 1-2 tablets by mouth every 4 (four) hours as needed for severe pain.  50 tablet  0  . ACCU-CHEK COMPACT STRIPS test strip       . aspirin 325 MG EC tablet Take 325 mg by mouth daily.       Scheduled: . allopurinol  100 mg Oral Daily  . amLODipine  5 mg Oral Daily  . calcitRIOL  0.25 mcg Oral Daily  . ferric gluconate (FERRLECIT/NULECIT) IV  125 mg Intravenous Q T,Th,Sa-HD  . hydrALAZINE  50 mg Oral BID  . metoprolol  50 mg Oral BID   Continuous:  [id number], acetaminophen, cloNIDine, furosemide, morphine injection, ondansetron (ZOFRAN) IV, ondansetron, senna-docusate, simethicone  Assessment/Plan: Chronic kidney disease stage V presenting with confusion, mental status change and worsening renal function now requiring dialysis. Continue treatment per nephrology . Disposition home when his spot for dialysis is arranged  Ecchymosis of abdominal wall related to recent repair of umbilical hernia and placement of PD catheter  Diabetes no recurrent  hypoglycemia  Hypertension, BP improved,    Disposition to home with outpatient hemodialysis arranged   LOS: 6 days   Zowie Lundahl D 03/17/2013, 10:20 AM

## 2013-03-18 LAB — RENAL FUNCTION PANEL
Albumin: 3.1 g/dL — ABNORMAL LOW (ref 3.5–5.2)
BUN: 47 mg/dL — ABNORMAL HIGH (ref 6–23)
CHLORIDE: 95 meq/L — AB (ref 96–112)
CO2: 26 meq/L (ref 19–32)
Calcium: 9.1 mg/dL (ref 8.4–10.5)
Creatinine, Ser: 7.28 mg/dL — ABNORMAL HIGH (ref 0.50–1.35)
GFR calc non Af Amer: 6 mL/min — ABNORMAL LOW (ref >90)
GFR, EST AFRICAN AMERICAN: 7 mL/min — AB (ref >90)
GLUCOSE: 99 mg/dL (ref 70–99)
POTASSIUM: 3.9 meq/L (ref 3.7–5.3)
Phosphorus: 5.8 mg/dL — ABNORMAL HIGH (ref 2.3–4.6)
SODIUM: 137 meq/L (ref 137–147)

## 2013-03-18 LAB — GLUCOSE, CAPILLARY
GLUCOSE-CAPILLARY: 108 mg/dL — AB (ref 70–99)
GLUCOSE-CAPILLARY: 146 mg/dL — AB (ref 70–99)

## 2013-03-18 MED ORDER — PENTAFLUOROPROP-TETRAFLUOROETH EX AERO: 1.0000 "application " | INHALATION_SPRAY | CUTANEOUS | Status: DC | PRN

## 2013-03-18 MED ORDER — SODIUM CHLORIDE 0.9 % IV SOLN: 100.0000 mL | INTRAVENOUS | Status: DC | PRN

## 2013-03-18 MED ORDER — NEPRO/CARBSTEADY PO LIQD: 237.0000 mL | ORAL | Status: DC | PRN

## 2013-03-18 MED ORDER — HEPARIN SODIUM (PORCINE) 1000 UNIT/ML DIALYSIS
1000.0000 [IU] | INTRAMUSCULAR | Status: DC | PRN
  Filled 2013-03-18: qty 1

## 2013-03-18 MED ORDER — ALTEPLASE 2 MG IJ SOLR
2.0000 mg | Freq: Once | INTRAMUSCULAR | Status: DC | PRN
  Filled 2013-03-18: qty 2

## 2013-03-18 MED ORDER — LIDOCAINE HCL (PF) 1 % IJ SOLN: 5.0000 mL | INTRAMUSCULAR | Status: DC | PRN

## 2013-03-18 MED ORDER — LIDOCAINE-PRILOCAINE 2.5-2.5 % EX CREA: 1.0000 "application " | TOPICAL_CREAM | CUTANEOUS | Status: DC | PRN

## 2013-03-18 NOTE — Procedures (Signed)
I have seen and examined this patient and agree with the plan of care   Seen on HD is doing well BP 130/80  Dickey Caamano W 03/18/2013, 11:00 AM

## 2013-03-18 NOTE — Progress Notes (Signed)
Subjective: No complaints  Objective: Vital signs in last 24 hours: Temp:  [98.1 F (36.7 C)-99.4 F (37.4 C)] 98.2 F (36.8 C) (01/17 1054) Pulse Rate:  [78-91] 78 (01/17 1054) Resp:  [18-20] 18 (01/17 1054) BP: (102-158)/(56-86) 135/74 mmHg (01/17 1054) SpO2:  [95 %-97 %] 96 % (01/17 1054) Weight:  [85.2 kg (187 lb 13.3 oz)-87.5 kg (192 lb 14.4 oz)] 85.2 kg (187 lb 13.3 oz) (01/17 1054) Weight change:  Last BM Date:  (prior to admission)  Intake/Output from previous day: 01/16 0701 - 01/17 0700 In: 720 [P.O.:720] Out: -  Intake/Output this shift: Total I/O In: -  Out: 2900 [Other:2900]  General appearance: alert and cooperative Resp: clear to auscultation bilaterally Cardio: regular rate and rhythm, S1, S2 normal, no murmur, click, rub or gallop GI: soft, non-tender; bowel sounds normal; no masses,  no organomegaly  Lab Results:  Recent Labs  03/16/13 0412  WBC 8.3  HGB 9.2*  HCT 28.8*  PLT 260   BMET  Recent Labs  03/17/13 0440 03/18/13 0515  NA 139 137  K 3.7 3.9  CL 98 95*  CO2 27 26  GLUCOSE 88 99  BUN 32* 47*  CREATININE 5.05* 7.28*  CALCIUM 8.8 9.1    Studies/Results: No results found.  Medications: I have reviewed the patient's current medications.  Assessment/Plan: Chronic kidney disease stage V presenting with confusion, mental status change and worsening renal function now requiring dialysis. Continue treatment per nephrology . Disposition home when his spot for dialysis is arranged. Delirium reslolved Ecchymosis of abdominal wall related to recent repair of umbilical hernia and placement of PD catheter  Diabetes no recurrent hypoglycemia  Hypertension, BP improved,  Possible discharge later today or tomorrow   LOS: 7 days   Sean Best Sean Best 03/18/2013, 11:28 AM

## 2013-03-19 NOTE — Discharge Summary (Signed)
Physician Discharge Summary  Patient ID: Sean Best MRN: [id number] DOB/AGE: 78-Apr-1935 78 y.o.  Admit date: 03/11/2013 Discharge date: 03/19/2013  Admission Diagnoses: Chronic kidney disease stage V Acute encephalopathy Hyperkalemia Hematoma in the abdominal wall Anemia Diabetes mellitus Hypertension Hyperlipidemia Umbilical hernia  Discharge Diagnoses:  Active Problems:   CKD (chronic kidney disease) stage 5, GFR less than 15 ml/min   Encephalopathy   Hyperkalemia   Hematoma of abdominal wall   Anemia   DM (diabetes mellitus), type 2 with renal complications, diet controlled   Benign hypertension   Hyperlipidemia   Umbilical hernia   Discharged Condition: good  Hospital Course: The patient was admitted on January 10. He had an umbilical hernia repair and placement of perennial dialysis catheter on January 5. He  presented with confusion and some acute mild delirium. On presentation his BUN was 98 and creatinine 12.78. CT scan of the brain showed age-related cerebral atrophy, ventricular megaly) ventricular white matter disease, not acute. Chest x-ray showed no acute changes just some atelectasis and cardiomegaly. The patient was admitted and seen by nephrology and started on dialysis. His acute mental changes resolve. Outpatient dialysis was arranged. He was discharged in good condition   Consults: nephrology   Significant Diagnostic Studies: labs: Sodium 137, potassium 2.9, chloride 95, recurrent 26, BUN 47, creatinine 7.2 weight and radiology: CXR: As above and CT scan: As above  Treatments: dialysis: Hemodialysis  Discharge Exam: Blood pressure 104/60, pulse 94, temperature 98.2 F (36.8 C), temperature source Oral, resp. rate 18, height 5\' 5"  (1.651 m), weight 85.2 kg (187 lb 13.3 oz), SpO2 98.00%. Resp: clear to auscultation bilaterally Cardio: regular rate and rhythm, S1, S2 normal, no murmur, click, rub or gallop  Disposition: 01-Home or Self Care   Future  Appointments Provider Department Dept Phone   03/24/2013 8:45 AM [facility] [place] [phone]   03/24/2013 5:00 PM Edward Jolly, [facility], [place] [phone]       Medication List    STOP taking these medications       COZAAR 100 MG tablet  Generic drug:  losartan      TAKE these medications       ACCU-CHEK COMPACT STRIPS test strip  Generic drug:  glucose blood     allopurinol 100 MG tablet  Commonly known as:  ZYLOPRIM  Take 100 mg by mouth daily.     aspirin 325 MG EC tablet  Take 325 mg by mouth daily.     calcitRIOL 0.25 MCG capsule  Commonly known as:  ROCALTROL  Take 0.25 mcg by mouth daily.     furosemide 80 MG tablet  Commonly known as:  LASIX  Take 80 mg by mouth 2 (two) times daily as needed for fluid.     hydrALAZINE 50 MG tablet  Commonly known as:  APRESOLINE  Take 50 mg by mouth 2 (two) times daily.     LIPITOR 40 MG tablet  Generic drug:  atorvastatin  Take 40 mg by mouth daily.     metoprolol 100 MG tablet  Commonly known as:  LOPRESSOR  Take 100 mg by mouth 2 (two) times daily.     oxyCODONE-acetaminophen 5-325 MG per tablet  Commonly known as:  ROXICET  Take 1-2 tablets by mouth every 4 (four) hours as needed for severe pain.           Follow-up Information   Follow up with [name], MD In 2  weeks.   Specialty:  Internal Medicine   Contact information:   [address] [place] [address] [phone]       Signed: Irven Shelling 03/19/2013, 9:45 AM

## 2013-03-24 ENCOUNTER — Encounter (HOSPITAL_COMMUNITY): Payer: Medicare Other

## 2013-03-24 ENCOUNTER — Ambulatory Visit (INDEPENDENT_AMBULATORY_CARE_PROVIDER_SITE_OTHER): Payer: Medicare Other | Admitting: General Surgery

## 2013-03-24 ENCOUNTER — Encounter (INDEPENDENT_AMBULATORY_CARE_PROVIDER_SITE_OTHER): Payer: Self-pay | Admitting: General Surgery

## 2013-03-24 VITALS — BP 127/85 | HR 82 | Temp 97.7°F | Resp 16 | Ht 64.0 in | Wt 192.0 lb

## 2013-03-24 DIAGNOSIS — K429 Umbilical hernia without obstruction or gangrene: Secondary | ICD-10-CM

## 2013-03-24 DIAGNOSIS — N184 Chronic kidney disease, stage 4 (severe): Secondary | ICD-10-CM

## 2013-03-24 NOTE — Progress Notes (Signed)
Chief complaint: Followup umbilical hernia repair and PD catheter placement  History: Patient returns to the office to a half weeks following repair of his umbilical hernia placement of peritoneal dialysis catheter. He was admitted to the hospital about 3 or 4 days postop due to declining mental status and was uremic and had institution of hemodialysis. He also had some bleeding from his umbilical incision with abdominal wall ecchymosis. The patient is now back home and doing well. He is accompanied by his daughter he states he is back to baseline. The catheter is being flushed weekly and is functioning well.  Exam: BP 127/85  Pulse 82  Temp(Src) 97.7 F (36.5 C) (Temporal)  Resp 16  Ht 5\' 4"  (1.626 m)  Wt 192 lb (87.091 kg)  BMI 32.94 kg/m2 General: Alert and in no distress Abdomen: PD catheter site is clean. There is an approximately 2 x 1 cm scab over the umbilical incision resolving ecchymosis and no evidence of infection or unusual drainage  Assessment and plan: Doing well following the procedures above. His wound appears clean and his daughter will dress this daily with a clean dry gauze. I will see him back in 3 weeks to reassess his umbilical wound.

## 2013-03-28 ENCOUNTER — Encounter (INDEPENDENT_AMBULATORY_CARE_PROVIDER_SITE_OTHER): Payer: Self-pay | Admitting: Surgery

## 2013-03-28 ENCOUNTER — Ambulatory Visit (INDEPENDENT_AMBULATORY_CARE_PROVIDER_SITE_OTHER): Payer: Medicare Other | Admitting: Surgery

## 2013-03-28 VITALS — BP 127/81 | HR 81 | Temp 100.6°F | Resp 18 | Ht 64.0 in | Wt 193.6 lb

## 2013-03-28 DIAGNOSIS — N185 Chronic kidney disease, stage 5: Secondary | ICD-10-CM

## 2013-03-28 NOTE — Progress Notes (Signed)
[place], MD,  [address]  [place], [place]    [place] Phone:  [phone] FAX:  [phone]   Re:   Sean Best DOB:   10/07/1933 MRN:   [id number]  Urgent Office  ASSESSMENT AND PLAN: 1.  Drainage from umbilicus.  Probable infarcted skin at umbilical incision [photo at end of note]  He had an umbilical hernia repair with an 8 cm Ventralex mesh at the same time as the PD cath placement - B. Hoxworth - 03/06/2013  Continue local wound care daily.  Will move up appointment to see Dr. Excell Seltzer next week just to keep a close eye on this.  2.  ESRD  CAPD cath placement by Dr. Excell Seltzer - 03/06/2013  He gets hemodialysis T/Th/Sat. 3.  Diabetes mellitus 4.  HTN 5.  Gout 6.  History of stroke  HISTORY OF PRESENT ILLNESS: Chief Complaint  Patient presents with  . Follow-up   Sean Best is a 80 y.o. (DOB: [birth year])  AA  male who is a patient of POLITE,RONALD D, MD and comes to the Urgent Office today for drainage from his umbilicus.  He just saw Dr. Excell Seltzer on 03/24/2013 for follow up for umbilical hernia repair and PD cath placement - 03/06/2013. He was at the dialysis center today and they were concerned about drainage from his umbilicus and wanted him seen today.  He has no fever.  His daughter, Sean Best, who is with him, is changing the dressing every day.  Past Medical History  Diagnosis Date  . Stroke   . Claudication   . Diabetes mellitus   . Hypertension   . Hyperlipidemia   . Cancer     prostate cancer  . Gout   . Overweight   . Arthritis   . Chronic total occlusion of artery of the extremities     pt not aware of this  . Chronic kidney disease     End stage renal disease  . Umbilical hernia   . Anemia    SOCIAL HISTORY: Daughter, Sean Best, is with patient.  PHYSICAL EXAM: BP 127/81  Pulse 81  Temp(Src) 100.6 F (38.1 C) (Temporal)  Resp 18  Ht 5\' 4"  (1.626 m)  Wt 193 lb 9.6 oz (87.816 kg)  BMI  33.21 kg/m2  Abdomen:  PD cath to the right of umbilicus is okay.  At the umbilical incision, it looks like has an ischemic triangle of skin, about 2 x 1 cm.  I cleaned this with peroxide.  His daughter is dressing this every day.  I think that is all that needs to be done for now.   Umbilical wound with full thickness skin loss.  DATA REVIEWED: Epic notes.  Alphonsa Overall, MD,  [facility], [address]  [place], [place]    [place] Phone:  [phone] FAX:  [phone]

## 2013-04-05 ENCOUNTER — Encounter (INDEPENDENT_AMBULATORY_CARE_PROVIDER_SITE_OTHER): Payer: Self-pay | Admitting: General Surgery

## 2013-04-05 ENCOUNTER — Ambulatory Visit (INDEPENDENT_AMBULATORY_CARE_PROVIDER_SITE_OTHER): Payer: Medicare Other | Admitting: General Surgery

## 2013-04-05 VITALS — BP 126/72 | HR 70 | Temp 98.0°F | Resp 18 | Ht 64.0 in | Wt 197.0 lb

## 2013-04-05 DIAGNOSIS — Z09 Encounter for follow-up examination after completed treatment for conditions other than malignant neoplasm: Secondary | ICD-10-CM

## 2013-04-05 NOTE — Progress Notes (Signed)
History: Patient returns for more long-term followup after placement of peritoneal dialysis catheter and repair of a good-sized umbilical hernia. Postoperatively he developed a wound ecchymosis and some hematoma under the umbilical skin flap. As last visit Dr. Lucia Gaskins noted some necrosis of the umbilical skin just superior to the incision. The patient reports he is feeling well. No drainage. The catheters being flushed weekly and functioning well.  Exam: BP 126/72  Pulse 70  Temp(Src) 98 F (36.7 C)  Resp 18  Ht 5\' 4"  (1.626 m)  Wt 197 lb (89.359 kg)  BMI 33.80 kg/m2 General: Alert and oriented and appears well Abdomen: PD exit site is clean. There is a proximally 1 cm clean wound that is open with some coagulum but no erythema or drainage or any evidence of infection.  Assessment and plan: I think his wound is healing and should progress without problems. Continue daily dressing changes and I'll see him back in one month for another check.

## 2013-04-12 ENCOUNTER — Encounter (INDEPENDENT_AMBULATORY_CARE_PROVIDER_SITE_OTHER): Payer: Medicare Other | Admitting: General Surgery

## 2013-04-28 ENCOUNTER — Encounter (INDEPENDENT_AMBULATORY_CARE_PROVIDER_SITE_OTHER): Payer: Medicare Other | Admitting: General Surgery

## 2013-06-08 ENCOUNTER — Telehealth (INDEPENDENT_AMBULATORY_CARE_PROVIDER_SITE_OTHER): Payer: Self-pay

## 2013-06-08 NOTE — Telephone Encounter (Signed)
Called patient, unable to leave a message.  RE:  See if patient would like to come in for an earlier appointment with Dr. Excell Seltzer.

## 2013-06-09 ENCOUNTER — Encounter (INDEPENDENT_AMBULATORY_CARE_PROVIDER_SITE_OTHER): Payer: Self-pay | Admitting: General Surgery

## 2013-06-09 ENCOUNTER — Ambulatory Visit (INDEPENDENT_AMBULATORY_CARE_PROVIDER_SITE_OTHER): Payer: Medicare Other | Admitting: General Surgery

## 2013-06-09 VITALS — BP 148/80 | HR 77 | Temp 98.1°F | Resp 16 | Ht 65.0 in | Wt 204.4 lb

## 2013-06-09 DIAGNOSIS — Z09 Encounter for follow-up examination after completed treatment for conditions other than malignant neoplasm: Secondary | ICD-10-CM

## 2013-06-09 NOTE — Progress Notes (Signed)
Chief complaint: Followup PD catheter and umbilical hernia  History: Patient returns for followup history of PD catheter placement and repair of an umbilical hernia complicated by a wound hematoma at his umbilical incision. He reports at this point he is doing well. There are flushing his catheter but not using it until he sees me for recheck.  Exam: BP 148/80  Pulse 77  Temp(Src) 98.1 F (36.7 C) (Oral)  Resp 16  Ht 5\' 5"  (1.651 m)  Wt 204 lb 6.4 oz (92.715 kg)  BMI 34.01 kg/m2 General: Appears well Abdomen: His umbilical incision is now well healed and there is no evidence of recurrent hernia or other complication.  Assessment and plan: Doing well with his wound is now completely healed and no apparent complication or contraindication to proceeding with peritoneal dialysis here

## 2013-12-09 ENCOUNTER — Encounter (HOSPITAL_COMMUNITY): Payer: Self-pay | Admitting: Emergency Medicine

## 2013-12-09 ENCOUNTER — Emergency Department (HOSPITAL_COMMUNITY): Payer: Medicare Other

## 2013-12-09 ENCOUNTER — Inpatient Hospital Stay (HOSPITAL_COMMUNITY): Payer: Medicare Other

## 2013-12-09 ENCOUNTER — Inpatient Hospital Stay (HOSPITAL_COMMUNITY)
Admission: EM | Admit: 2013-12-09 | Discharge: 2013-12-14 | DRG: 070 | Disposition: A | Discharge status: Home Health | Payer: Medicare Other | Attending: Internal Medicine | Admitting: Internal Medicine

## 2013-12-09 DIAGNOSIS — I10 Essential (primary) hypertension: Secondary | ICD-10-CM | POA: Diagnosis present

## 2013-12-09 DIAGNOSIS — G9341 Metabolic encephalopathy: Secondary | ICD-10-CM | POA: Diagnosis present

## 2013-12-09 DIAGNOSIS — I739 Peripheral vascular disease, unspecified: Secondary | ICD-10-CM | POA: Diagnosis present

## 2013-12-09 DIAGNOSIS — Z8673 Personal history of transient ischemic attack (TIA), and cerebral infarction without residual deficits: Secondary | ICD-10-CM | POA: Diagnosis not present

## 2013-12-09 DIAGNOSIS — I120 Hypertensive chronic kidney disease with stage 5 chronic kidney disease or end stage renal disease: Secondary | ICD-10-CM | POA: Diagnosis present

## 2013-12-09 DIAGNOSIS — M109 Gout, unspecified: Secondary | ICD-10-CM | POA: Diagnosis present

## 2013-12-09 DIAGNOSIS — E875 Hyperkalemia: Secondary | ICD-10-CM | POA: Diagnosis present

## 2013-12-09 DIAGNOSIS — Z8546 Personal history of malignant neoplasm of prostate: Secondary | ICD-10-CM | POA: Diagnosis not present

## 2013-12-09 DIAGNOSIS — E11649 Type 2 diabetes mellitus with hypoglycemia without coma: Secondary | ICD-10-CM | POA: Diagnosis present

## 2013-12-09 DIAGNOSIS — Z992 Dependence on renal dialysis: Secondary | ICD-10-CM | POA: Diagnosis present

## 2013-12-09 DIAGNOSIS — E785 Hyperlipidemia, unspecified: Secondary | ICD-10-CM | POA: Diagnosis present

## 2013-12-09 DIAGNOSIS — T375X1A Poisoning by antiviral drugs, accidental (unintentional), initial encounter: Secondary | ICD-10-CM | POA: Diagnosis present

## 2013-12-09 DIAGNOSIS — N186 End stage renal disease: Secondary | ICD-10-CM | POA: Diagnosis present

## 2013-12-09 DIAGNOSIS — B029 Zoster without complications: Secondary | ICD-10-CM | POA: Diagnosis present

## 2013-12-09 DIAGNOSIS — E872 Acidosis: Secondary | ICD-10-CM | POA: Diagnosis present

## 2013-12-09 DIAGNOSIS — G9340 Encephalopathy, unspecified: Secondary | ICD-10-CM

## 2013-12-09 DIAGNOSIS — I517 Cardiomegaly: Secondary | ICD-10-CM | POA: Diagnosis present

## 2013-12-09 DIAGNOSIS — D649 Anemia, unspecified: Secondary | ICD-10-CM | POA: Diagnosis present

## 2013-12-09 DIAGNOSIS — E1122 Type 2 diabetes mellitus with diabetic chronic kidney disease: Secondary | ICD-10-CM

## 2013-12-09 DIAGNOSIS — N19 Unspecified kidney failure: Secondary | ICD-10-CM | POA: Diagnosis present

## 2013-12-09 DIAGNOSIS — D631 Anemia in chronic kidney disease: Secondary | ICD-10-CM | POA: Diagnosis present

## 2013-12-09 DIAGNOSIS — E1129 Type 2 diabetes mellitus with other diabetic kidney complication: Secondary | ICD-10-CM | POA: Diagnosis present

## 2013-12-09 DIAGNOSIS — R7989 Other specified abnormal findings of blood chemistry: Secondary | ICD-10-CM

## 2013-12-09 DIAGNOSIS — Z789 Other specified health status: Secondary | ICD-10-CM | POA: Diagnosis not present

## 2013-12-09 HISTORY — DX: End stage renal disease: N18.6

## 2013-12-09 HISTORY — DX: Dependence on renal dialysis: Z99.2

## 2013-12-09 HISTORY — DX: Zoster without complications: B02.9

## 2013-12-09 LAB — CBC WITH DIFFERENTIAL/PLATELET
BASOS ABS: 0 10*3/uL (ref 0.0–0.1)
BASOS PCT: 1 % (ref 0–1)
Eosinophils Absolute: 0.1 10*3/uL (ref 0.0–0.7)
Eosinophils Relative: 1 % (ref 0–5)
HCT: 33.8 % — ABNORMAL LOW (ref 39.0–52.0)
Hemoglobin: 11.1 g/dL — ABNORMAL LOW (ref 13.0–17.0)
Lymphocytes Relative: 17 % (ref 12–46)
Lymphs Abs: 0.8 10*3/uL (ref 0.7–4.0)
MCH: 30.4 pg (ref 26.0–34.0)
MCHC: 32.8 g/dL (ref 30.0–36.0)
MCV: 92.6 fL (ref 78.0–100.0)
MONO ABS: 1.1 10*3/uL — AB (ref 0.1–1.0)
Monocytes Relative: 23 % — ABNORMAL HIGH (ref 3–12)
NEUTROS ABS: 2.7 10*3/uL (ref 1.7–7.7)
Neutrophils Relative %: 58 % (ref 43–77)
Platelets: 168 10*3/uL (ref 150–400)
RBC: 3.65 MIL/uL — ABNORMAL LOW (ref 4.22–5.81)
RDW: 14.8 % (ref 11.5–15.5)
WBC: 4.7 10*3/uL (ref 4.0–10.5)

## 2013-12-09 LAB — COMPREHENSIVE METABOLIC PANEL
ALK PHOS: 52 U/L (ref 39–117)
ALT: 17 U/L (ref 0–53)
ANION GAP: 18 — AB (ref 5–15)
AST: 16 U/L (ref 0–37)
Albumin: 3.6 g/dL (ref 3.5–5.2)
BUN: 96 mg/dL — AB (ref 6–23)
CO2: 17 meq/L — AB (ref 19–32)
Calcium: 9.1 mg/dL (ref 8.4–10.5)
Chloride: 99 mEq/L (ref 96–112)
Creatinine, Ser: 17.3 mg/dL — ABNORMAL HIGH (ref 0.50–1.35)
GFR, EST AFRICAN AMERICAN: 3 mL/min — AB (ref >90)
GFR, EST NON AFRICAN AMERICAN: 2 mL/min — AB (ref >90)
GLUCOSE: 56 mg/dL — AB (ref 70–99)
Potassium: 6.3 mEq/L — ABNORMAL HIGH (ref 3.7–5.3)
Sodium: 134 mEq/L — ABNORMAL LOW (ref 137–147)
TOTAL PROTEIN: 6.6 g/dL (ref 6.0–8.3)
Total Bilirubin: <0.2 mg/dL — ABNORMAL LOW (ref 0.3–1.2)

## 2013-12-09 LAB — CBG MONITORING, ED
Glucose-Capillary: 60 mg/dL — ABNORMAL LOW (ref 70–99)
Glucose-Capillary: 63 mg/dL — ABNORMAL LOW (ref 70–99)
Glucose-Capillary: 48 mg/dL — ABNORMAL LOW (ref 70–99)
Glucose-Capillary: 70 mg/dL (ref 70–99)

## 2013-12-09 LAB — URINALYSIS, ROUTINE W REFLEX MICROSCOPIC
BILIRUBIN URINE: NEGATIVE
Glucose, UA: NEGATIVE mg/dL
Ketones, ur: NEGATIVE mg/dL
Leukocytes, UA: NEGATIVE
Nitrite: NEGATIVE
PROTEIN: 100 mg/dL — AB
Specific Gravity, Urine: 1.016 (ref 1.005–1.030)
UROBILINOGEN UA: 0.2 mg/dL (ref 0.0–1.0)
pH: 5.5 (ref 5.0–8.0)

## 2013-12-09 LAB — URINE MICROSCOPIC-ADD ON

## 2013-12-09 LAB — AMMONIA: Ammonia: 50 umol/L (ref 11–60)

## 2013-12-09 MED ORDER — DEXTROSE 5 % IV SOLN
INTRAVENOUS | Status: DC
  Administered 2013-12-09: 19:00:00 via INTRAVENOUS

## 2013-12-09 MED ORDER — DEXTROSE 50 % IV SOLN
1.0000 | Freq: Once | INTRAVENOUS | Status: AC
  Administered 2013-12-09: 50 mL via INTRAVENOUS
  Filled 2013-12-09: qty 50

## 2013-12-09 MED ORDER — DELFLEX-LC/1.5% DEXTROSE 346 MOSM/L IP SOLN
INTRAPERITONEAL | Status: DC
  Administered 2013-12-12: 15000 mL via INTRAPERITONEAL

## 2013-12-09 MED ORDER — SODIUM POLYSTYRENE SULFONATE 15 GM/60ML PO SUSP
30.0000 g | Freq: Once | ORAL | Status: AC
  Administered 2013-12-09: 30 g via RECTAL
  Filled 2013-12-09: qty 120

## 2013-12-09 MED ORDER — LORAZEPAM 2 MG/ML IJ SOLN
1.0000 mg | Freq: Once | INTRAMUSCULAR | Status: AC
  Administered 2013-12-09: 1 mg via INTRAVENOUS
  Filled 2013-12-09: qty 1

## 2013-12-09 MED ORDER — NICOTINE 21 MG/24HR TD PT24: 21.0000 mg | MEDICATED_PATCH | Freq: Once | TRANSDERMAL | Status: DC

## 2013-12-09 MED ORDER — NALOXONE HCL 0.4 MG/ML IJ SOLN
0.4000 mg | Freq: Once | INTRAMUSCULAR | Status: AC
  Administered 2013-12-09: 0.4 mg via INTRAVENOUS
  Filled 2013-12-09: qty 1

## 2013-12-09 MED ORDER — DEXTROSE 50 % IV SOLN
25.0000 mL | Freq: Once | INTRAVENOUS | Status: AC
Start: 2013-12-09 — End: 2013-12-09
  Administered 2013-12-09: 25 mL via INTRAVENOUS
  Filled 2013-12-09: qty 50

## 2013-12-09 MED ORDER — DELFLEX-LC/1.5% DEXTROSE 346 MOSM/L IP SOLN: Freq: Once | INTRAPERITONEAL | Status: DC

## 2013-12-09 NOTE — ED Notes (Signed)
Pt daughter called this RN to the room.  Pt had become agitated and hallucinating stating "the building was falling," referring to the handle on the door.  Pt also pointed out into the hall stating "I watched the bed fall over out there," with no bed visible to this RN in the hall. Notified [place], [place].

## 2013-12-09 NOTE — H&P (Addendum)
History and Physical  Sean Best [id number] DOB: 1933/04/03 DOA: 12/09/2013  Referring physician: Dr. Leonard Schwartz, EDP PCP: [name], MD  Outpatient Specialists:  1. Nephrology: Dr. Pearson Grippe  Chief Complaint: Altered mental status  HPI: Sean Best is a 80 y.o. male with history of ESRD on peritoneal dialysis since April 2015, was on hemodialysis prior to that, CVA without residual deficits, diet-controlled DM 2, HTN, HLD, prostate cancer, gout, recent shingles, presented to the ED with altered mental status. Patient unable to provide significant history secondary to altered mental status. History obtained from patient's daughter who works with the [employer]. Patient lives alone and is independent of activities of daily living. He does manual peritoneal dialysis-4 cycles per day. Daughter was with patient on telemetry in last night and he was in his usual state of health. Patient's friend stayed with him. This morning daughter was alerted that patient was confused overnight, walking from room to room, talking to his demise wife, scattered his room. No reported fever, chills, nausea, vomiting, pain, dyspnea. When daughter went to his room, she found him to be confused and hallucinating. He was recently started on Valtrex and opioids for right lower extremity shingles, 3 days ago. In the ED, he was apparently some coherent initially but subsequently became agitated. He was given a milligram of Ativan to obtain a head CT. Patient is slightly somnolent at this time. In the ED, potassium 6.3, glucose 56, BUN 96, creatinine 17.3, hemoglobin 11.1 and chest x-ray shows cardiomegaly without edema or consolidation. A repeat CBG 60. Patient is getting half an amp of D50. Hospitalist admission requested.  Review of Systems: All systems reviewed and apart from history of presenting illness, are negative.  Past Medical History  Diagnosis Date  . Stroke   . Claudication   .  Diabetes mellitus   . Hypertension   . Hyperlipidemia   . Cancer     prostate cancer  . Gout   . Overweight(278.02)   . Arthritis   . Chronic total occlusion of artery of the extremities     pt not aware of this  . Chronic kidney disease     End stage renal disease  . Umbilical hernia   . Anemia   . Shingles    Past Surgical History  Procedure Laterality Date  . Prostate surgery      prostate seed placement  . Av fistula placement, radiocephalic  [id number]    Left arm  . Eye surgery Left     cataract surgery  . Eye surgery Left     for eye injury  . Capd insertion N/A 03/06/2013    Procedure: LAPAROSCOPIC INSERTION CONTINUOUS AMBULATORY PERITONEAL DIALYSIS  (CAPD) CATHETER;  Surgeon: Edward Jolly, MD;  Location: [place];  Service: General;  Laterality: N/A;  . Umbilical hernia repair N/A 03/06/2013    Procedure: HERNIA REPAIR UMBILICAL WITH MESH;  Surgeon: Edward Jolly, MD;  Location: [place];  Service: General;  Laterality: N/A;   Social History:  reports that he has never smoked. He has never used smokeless tobacco. He reports that he does not drink alcohol or use illicit drugs. Widowed. Lives alone. Independent of activities of daily living.  Allergies  Allergen Reactions  . Cardura [Doxazosin Mesylate] Other (See Comments)    Hallucinations    Family History  Problem Relation Age of Onset  . Cancer Mother   . Heart disease Mother   . Cancer Father  Prior to Admission medications   Medication Sig Start Date End Date Taking? Authorizing Provider  allopurinol (ZYLOPRIM) 100 MG tablet Take 100 mg by mouth daily.     Yes Historical Provider, MD  aspirin EC 325 MG tablet Take 325 mg by mouth daily.   Yes Historical Provider, MD  atorvastatin (LIPITOR) 40 MG tablet Take 40 mg by mouth daily.     Yes Historical Provider, MD  calcitRIOL (ROCALTROL) 0.5 MCG capsule Take 0.5 mcg by mouth every Monday, Wednesday, and Friday.   Yes Historical Provider, MD    gentamicin ointment (GARAMYCIN) 0.1 % Apply 1 application topically 2 (two) times daily as needed (for infection control at [facility] site).  03/22/13  Yes Historical Provider, MD  hydrALAZINE (APRESOLINE) 50 MG tablet Take 50 mg by mouth 2 (two) times daily.   Yes Historical Provider, MD  metoprolol (LOPRESSOR) 100 MG tablet Take 100 mg by mouth 2 (two) times daily.   Yes Historical Provider, MD  multivitamin (RENA-VIT) TABS tablet Take 1 tablet by mouth daily.   Yes Historical Provider, MD  oxyCODONE-acetaminophen (PERCOCET/ROXICET) 5-325 MG per tablet Take 1 tablet by mouth every 4 (four) hours as needed (pain).  12/07/13  Yes Historical Provider, MD  sevelamer carbonate (RENVELA) 800 MG tablet Take 1,600-2,400 mg by mouth See admin instructions. Take 3 tablets (2400 mg) by mouth 3 times daily with meals and 2 tablets (1600 mg) twice daily with snacks   Yes Historical Provider, MD  valACYclovir (VALTREX) 1000 MG tablet Take 1,000 mg by mouth 3 (three) times daily.  12/07/13  Yes Historical Provider, MD  ACCU-CHEK COMPACT STRIPS test strip  05/20/10   Historical Provider, MD   Physical Exam: Filed Vitals:   12/09/13 1550 12/09/13 1552 12/09/13 1600 12/09/13 1630  BP: 152/94 152/94 172/87 154/90  Pulse: 74 75  77  Temp:  98.1 F (36.7 C)    TempSrc:  Oral    Resp:  20    SpO2: 100% 100%  98%     General exam: Moderately built and nourished pleasant elderly male patient, lying comfortably supine on the gurney in no obvious distress.  Head, eyes and ENT: Nontraumatic and normocephalic. Pupils equally reacting to light and accommodation. Oral mucosa moist.  Neck: Supple. No JVD, carotid bruit or thyromegaly.  Lymphatics: No lymphadenopathy.  Respiratory system: Clear to auscultation. No increased work of breathing.  Cardiovascular system: S1 and S2 heard, RRR. No JVD, murmurs, gallops, clicks or pedal edema.  Gastrointestinal system: Abdomen is nondistended, soft and nontender. Normal bowel  sounds heard. No organomegaly or masses appreciated. PD catheter site intact.  Central nervous system: Somnolent but easily arousable and oriented to person and place. No focal neurological deficits. Patient having intermittent myoclonic jerks.  Extremities: Symmetric 5 x 5 power. Peripheral pulses symmetrically felt.   Skin: Extensive rash over the medial aspect of the right lower extremity extending from the groin all the way to the mid leg, more prominent in the thigh region with areas of healing vesicles and some scabbed lesions consistent with shingles.  Musculoskeletal system: Negative exam.  Psychiatry: Pleasant and cooperative.   Labs on Admission:  Basic Metabolic Panel:  Recent Labs Lab 12/09/13 1543  NA 134*  K 6.3*  CL 99  CO2 17*  GLUCOSE 56*  BUN 96*  CREATININE 17.30*  CALCIUM 9.1   Liver Function Tests:  Recent Labs Lab 12/09/13 1543  AST 16  ALT 17  ALKPHOS 52  BILITOT <0.2*  PROT 6.6  ALBUMIN 3.6   No results found for this basename: LIPASE, AMYLASE,  in the last 168 hours No results found for this basename: AMMONIA,  in the last 168 hours CBC:  Recent Labs Lab 12/09/13 1543  WBC 4.7  NEUTROABS 2.7  HGB 11.1*  HCT 33.8*  MCV 92.6  PLT 168   Cardiac Enzymes: No results found for this basename: CKTOTAL, CKMB, CKMBINDEX, TROPONINI,  in the last 168 hours  BNP (last 3 results) No results found for this basename: PROBNP,  in the last 8760 hours CBG:  Recent Labs Lab 12/09/13 1801  GLUCAP 60*    Radiological Exams on Admission: Dg Chest 2 View  12/09/2013   CLINICAL DATA:  Renal failure ; herpes zoster infection  EXAM: CHEST  2 VIEW  COMPARISON:  March 11, 2013  FINDINGS: Lungs are clear. Heart is enlarged, stable. Pulmonary vascularity is normal. No adenopathy. No bone lesions.  IMPRESSION: Cardiomegaly.  No edema or consolidation.   Electronically Signed   By: Lowella Grip M.D.   On: 12/09/2013 15:33    EKG: Independently  reviewed. Sinus rhythm, LAD and no acute changes. No change suggestive of hyperkalemia.  Assessment/Plan Principal Problem:   Encephalopathy acute Active Problems:   Hyperkalemia   Anemia   DM (diabetes mellitus), type 2 with renal complications, diet controlled   Benign hypertension   Hyperlipidemia   ESRD on peritoneal dialysis   Uremia   Thigh shingles   Acute encephalopathy   1. Acute encephalopathy-likely uremic, complicated by opioids and hypoglycemia. No clinical focus of infection or focal neurological deficits. UA not impressive. Chest x-ray negative. Followup CT head. Nephrology been consulted to evaluate for urgent dialysis. Hold opioids and sedative medications. 2. ESRD on PD/hyperkalemia/anion gap metabolic acidosis/uremia: Nephrology been consulted for urgent hemodialysis-at least on the short-term before returning to peritoneal dialysis. Patient seems to have a functioning left forearm AV fistula. 3. DM 2 with hypoglycemia: Diet controlled at home. Monitor CBGs closely and treat appropriately. 4. Hypertension: Controlled. Continue home medications. 5. Right lower extremity shingles: Airbone/contact isolation. Continue Valtrex. 6. Anemia: Secondary to chronic kidney disease. Follow CBCs.     Code Status: Full  Family Communication: Discussed with daughter at bedside.  Disposition Plan: Home when medically stable   Time spent: 58 minutes  Sean Fairbank, MD, FACP, FHM. [facility] Pager [phone]  If 7PM-7AM, please contact night-coverage [email or web address] Password [email or web address] 12/09/2013, 6:04 PM    Addendum I discussed with Dr. Jonnie Finner around 7 pm last night. Persistently hypoglycemic > ordered an amp of IV D50 and trial of a dose of Narcan to see if MS more from Opiods. As d/w daughter and nursing, patient presented to ED alert and oriented, but while in ED became progressively confused and agitated. He required a dose of Ativan to get CT head then became more  somnolent. No neck stiffness, fever or leukocytosis to suggest meningitis/encephalitis.  Vernell Leep, MD, FACP, FHM. [facility] Pager [phone]  If 7PM-7AM, please contact night-coverage [email or web address] Password [email or web address] 12/10/2013, 8:16 AM

## 2013-12-09 NOTE — ED Notes (Addendum)
Pt from home via [facility].  Family requested pt to be seen and have his kidneys checked (dialysis pt) after taking oxycodone and valtrex since being dx with shingles this week.  Pt ambulatory and has no complaints.  Pt in NAD, A&O.

## 2013-12-09 NOTE — ED Notes (Signed)
Spoke to 75 charge RN about performing peritoneal dialysis on pt. The charge RN had spoke to the nephrologist and was instructed to perform the procedure once pt was placed in room on the floor due to cleanliness.

## 2013-12-09 NOTE — ED Provider Notes (Signed)
CSN: [id number]     Arrival date & time 12/09/13  1258 History   First MD Initiated Contact with Patient 12/09/13 1304     Chief Complaint  Patient presents with  . Follow-up     (Consider location/radiation/quality/duration/timing/severity/associated sxs/prior Treatment) HPI Sean Best is a 80 year old male with past medical history of ESRD on peritoneal dialysis, hypertension, diabetes, CVA, cancer who presents the ER today with "not acting right" as reported by family. Patient's family in the room states that patient has been "talking out of his head" and has had tremors starting this morning. Patient's family member reports that this is the same way the patient was acting when admitted to the hospital in January for end-stage renal disease. Patient denies any dizziness, weakness, chest pain, shortness of breath, nausea, vomiting, diarrhea, dysuria. Past Medical History  Diagnosis Date  . Stroke   . Claudication   . Diabetes mellitus   . Hypertension   . Hyperlipidemia   . Cancer     prostate cancer  . Gout   . Overweight(278.02)   . Arthritis   . Chronic total occlusion of artery of the extremities     pt not aware of this  . Chronic kidney disease     End stage renal disease  . Umbilical hernia   . Anemia   . Shingles    Past Surgical History  Procedure Laterality Date  . Prostate surgery      prostate seed placement  . Av fistula placement, radiocephalic  [id number]    Left arm  . Eye surgery Left     cataract surgery  . Eye surgery Left     for eye injury  . Capd insertion N/A 03/06/2013    Procedure: LAPAROSCOPIC INSERTION CONTINUOUS AMBULATORY PERITONEAL DIALYSIS  (CAPD) CATHETER;  Surgeon: Edward Jolly, MD;  Location: [place];  Service: General;  Laterality: N/A;  . Umbilical hernia repair N/A 03/06/2013    Procedure: HERNIA REPAIR UMBILICAL WITH MESH;  Surgeon: Edward Jolly, MD;  Location: [facility] OR;  Service: General;  Laterality: N/A;   Family History   Problem Relation Age of Onset  . Cancer Mother   . Heart disease Mother   . Cancer Father    History  Substance Use Topics  . Smoking status: Never Smoker   . Smokeless tobacco: Never Used  . Alcohol Use: No    Review of Systems  Constitutional: Negative for fever.  HENT: Negative for trouble swallowing.   Eyes: Negative for visual disturbance.  Respiratory: Negative for shortness of breath.   Cardiovascular: Negative for chest pain.  Gastrointestinal: Negative for nausea, vomiting and abdominal pain.  Genitourinary: Negative for dysuria.  Musculoskeletal: Negative for neck pain.  Skin: Negative for rash.  Neurological: Positive for tremors and light-headedness. Negative for dizziness, weakness and numbness.  Psychiatric/Behavioral: Negative.       Allergies  Cardura  Home Medications   Prior to Admission medications   Medication Sig Start Date End Date Taking? Authorizing Provider  allopurinol (ZYLOPRIM) 100 MG tablet Take 100 mg by mouth daily.     Yes Historical Provider, MD  aspirin EC 325 MG tablet Take 325 mg by mouth daily.   Yes Historical Provider, MD  atorvastatin (LIPITOR) 40 MG tablet Take 40 mg by mouth daily.     Yes Historical Provider, MD  calcitRIOL (ROCALTROL) 0.5 MCG capsule Take 0.5 mcg by mouth every Monday, Wednesday, and Friday.   Yes Historical Provider, MD  gentamicin ointment (GARAMYCIN)  0.1 % Apply 1 application topically 2 (two) times daily as needed (for infection control at [facility] site).  03/22/13  Yes Historical Provider, MD  hydrALAZINE (APRESOLINE) 50 MG tablet Take 50 mg by mouth 2 (two) times daily.   Yes Historical Provider, MD  metoprolol (LOPRESSOR) 100 MG tablet Take 100 mg by mouth 2 (two) times daily.   Yes Historical Provider, MD  multivitamin (RENA-VIT) TABS tablet Take 1 tablet by mouth daily.   Yes Historical Provider, MD  oxyCODONE-acetaminophen (PERCOCET/ROXICET) 5-325 MG per tablet Take 1 tablet by mouth every 4 (four) hours as  needed (pain).  12/07/13  Yes Historical Provider, MD  sevelamer carbonate (RENVELA) 800 MG tablet Take 1,600-2,400 mg by mouth See admin instructions. Take 3 tablets (2400 mg) by mouth 3 times daily with meals and 2 tablets (1600 mg) twice daily with snacks   Yes Historical Provider, MD  valACYclovir (VALTREX) 1000 MG tablet Take 1,000 mg by mouth 3 (three) times daily.  12/07/13  Yes Historical Provider, MD  ACCU-CHEK COMPACT STRIPS test strip  05/20/10   Historical Provider, MD   BP 154/90  Pulse 77  Temp(Src) 98.1 F (36.7 C) (Oral)  Resp 20  SpO2 98% Physical Exam  Nursing note and vitals reviewed. Constitutional: He is oriented to person, place, and time. He appears well-developed and well-nourished. No distress.  HENT:  Head: Normocephalic and atraumatic.  Mouth/Throat: Oropharynx is clear and moist. No oropharyngeal exudate.  Eyes: Right eye exhibits no discharge. Left eye exhibits no discharge. No scleral icterus.  Neck: Normal range of motion.  Cardiovascular: Normal rate, regular rhythm and normal heart sounds.   No murmur heard. Pulmonary/Chest: Effort normal and breath sounds normal. No respiratory distress.  Abdominal: Soft. There is no tenderness.  Musculoskeletal: Normal range of motion. He exhibits no edema and no tenderness.  Neurological: He is alert and oriented to person, place, and time. He has normal strength. He displays tremor. No cranial nerve deficit or sensory deficit. He displays a negative Romberg sign. Coordination normal. GCS eye subscore is 4. GCS verbal subscore is 5. GCS motor subscore is 6.  Patient's father 5 motor strength in all major muscle groups of upper and lower extremities. Finger to nose normal.  Skin: Skin is warm and dry. No rash noted. He is not diaphoretic.  Psychiatric: He has a normal mood and affect.    ED Course  Procedures (including critical care time) Labs Review Labs Reviewed  CBC WITH DIFFERENTIAL - Abnormal; Notable for the  following:    RBC 3.65 (*)    Hemoglobin 11.1 (*)    HCT 33.8 (*)    Monocytes Relative 23 (*)    Monocytes Absolute 1.1 (*)    All other components within normal limits  COMPREHENSIVE METABOLIC PANEL - Abnormal; Notable for the following:    Sodium 134 (*)    Potassium 6.3 (*)    CO2 17 (*)    Glucose, Bld 56 (*)    BUN 96 (*)    Creatinine, Ser 17.30 (*)    Total Bilirubin <0.2 (*)    GFR calc non Af Amer 2 (*)    GFR calc Af Amer 3 (*)    Anion gap 18 (*)    All other components within normal limits  URINALYSIS, ROUTINE W REFLEX MICROSCOPIC - Abnormal; Notable for the following:    Hgb urine dipstick SMALL (*)    Protein, ur 100 (*)    All other components within normal limits  URINE MICROSCOPIC-ADD ON  AMMONIA    Imaging Review Dg Chest 2 View  12/09/2013   CLINICAL DATA:  Renal failure ; herpes zoster infection  EXAM: CHEST  2 VIEW  COMPARISON:  March 11, 2013  FINDINGS: Lungs are clear. Heart is enlarged, stable. Pulmonary vascularity is normal. No adenopathy. No bone lesions.  IMPRESSION: Cardiomegaly.  No edema or consolidation.   Electronically Signed   By: Lowella Grip M.D.   On: 12/09/2013 15:33     EKG Interpretation None      MDM   Final diagnoses:  Encephalopathy   80 year old male on peritoneal dialysis daily, with hemodialysis site, with one day of mild confusion, tremors. Patient also on oxycodone, Valtrex with diagnosis of shingles on Thursday. Workup to determine and/or rule out out encephalopathy versus confusion from narcotics. Workup with CBC, CMP, UA, EKG.   Patient noted to be hyperkalemic, with renal function elevated from the last values. Creatinine is 17, BUN  96. We will consult medicine and nephrology for admission and dialysis.  5:00 PM: Patient with increasing agitation confusion, we'll order CT head without contrast, and administer Ativan for agitation. Medicine agrees to admit patient to step down floor. The patient appears  reasonably stabilized for admission considering the current resources, flow, and capabilities available in the ED at this time, and I doubt any other [facility] requiring further screening and/or treatment in the ED prior to admission.   Patient signed out to Dr. Sol Passer, MD and Dr. Leonard Schwartz to followup with nephrology for admission orders  Signed,  Dahlia Bailiff, PA-C 5:54 PM   This patient seen and discussed with Dr. Davonna Belling, M.D.  Sean Mew, PA-C 12/09/13 1754

## 2013-12-09 NOTE — Consult Note (Signed)
Renal Service Consult Note [facility]  Brownie Nehme 12/09/2013 Sol Blazing Requesting Physician:  Dr Algis Liming  Reason for Consult:  ESRD pt on PD presenting with AMS HPI: The patient is a 80 y.o. year-old with hx of HTN, DM, CVA and ESRD , started PD earlier this year in the spring after 3 months of HD which started Jan 2015. Brought to ED for AMS for about 24 hours.  Hx prostate cancer.   Started PD in April 2015 with the cycler.  According to daughter, pt has been alternating between using the cycler and doing manual exchanges. They met with Dr Joelyn Oms on 9/29 and decided to go all manual exchanges at 4 x per day. Went to urgent care on Thurs with shingles and was put on Valtrex and oxycodone.  The Valtrex was prescribed at 1 gm TID.  Pt took about 1.5 gm total of Valtrex since then.  He has been taking oxycodone as well.  He became confused last night and family brought him to the ED.    He was agitated in the ED today and had to be sedated.  He is sleeping now.  History provided by family.   Home meds : allopurinol, asa, Lipitor, Rocaltrol, hydralazine, Lopressor, MVI, percocet, Renvela, Valtrex  Chart review: 03/19/13 - admitted for umb hernia repair and placement of PD cath in prep for dialysis.  Became confused, BUN 98 and Cr 12.7. Admitted and seen by neph and started on dialysis, HD.  DC'd to OP hemodialysis.    ROS  no fevers  no cough  no CP  no n/v/d  no abd pain  hip hurting  R leg pain d/t shingles  Past Medical History  Past Medical History  Diagnosis Date  . Stroke   . Claudication   . Diabetes mellitus   . ESRD on peritoneal dialysis     Started dialysis in 2014.  Has been doing peritoneal dialysis at home.     Marland Kitchen Hyperlipidemia   . Cancer     prostate cancer  . Gout   . Overweight(278.02)   . Arthritis   . Chronic total occlusion of artery of the extremities     pt not aware of this  . Umbilical hernia   . Anemia   . Shingles   .  Hypertension    Past Surgical History  Past Surgical History  Procedure Laterality Date  . Prostate surgery      prostate seed placement  . Av fistula placement, radiocephalic  78/93/8101    Left arm  . Eye surgery Left     cataract surgery  . Eye surgery Left     for eye injury  . Capd insertion N/A 03/06/2013    Procedure: LAPAROSCOPIC INSERTION CONTINUOUS AMBULATORY PERITONEAL DIALYSIS  (CAPD) CATHETER;  Surgeon: Edward Jolly, MD;  Location: [facility];  Service: General;  Laterality: N/A;  . Umbilical hernia repair N/A 03/06/2013    Procedure: HERNIA REPAIR UMBILICAL WITH MESH;  Surgeon: Edward Jolly, MD;  Location: [facility] OR;  Service: General;  Laterality: N/A;   Family History  Family History  Problem Relation Age of Onset  . Cancer Mother   . Heart disease Mother   . Cancer Father    Social History  reports that he has never smoked. He has never used smokeless tobacco. He reports that he does not drink alcohol or use illicit drugs. Allergies  Allergies  Allergen Reactions  . Cardura [Doxazosin Mesylate] Other (See Comments)  Hallucinations   Home medications Prior to Admission medications   Medication Sig Start Date End Date Taking? Authorizing Provider  allopurinol (ZYLOPRIM) 100 MG tablet Take 100 mg by mouth daily.     Yes Historical Provider, MD  aspirin EC 325 MG tablet Take 325 mg by mouth daily.   Yes Historical Provider, MD  atorvastatin (LIPITOR) 40 MG tablet Take 40 mg by mouth daily.     Yes Historical Provider, MD  calcitRIOL (ROCALTROL) 0.5 MCG capsule Take 0.5 mcg by mouth every Monday, Wednesday, and Friday.   Yes Historical Provider, MD  gentamicin ointment (GARAMYCIN) 0.1 % Apply 1 application topically 2 (two) times daily as needed (for infection control at [facility] site).  03/22/13  Yes Historical Provider, MD  hydrALAZINE (APRESOLINE) 50 MG tablet Take 50 mg by mouth 2 (two) times daily.   Yes Historical Provider, MD  metoprolol (LOPRESSOR) 100 MG  tablet Take 100 mg by mouth 2 (two) times daily.   Yes Historical Provider, MD  multivitamin (RENA-VIT) TABS tablet Take 1 tablet by mouth daily.   Yes Historical Provider, MD  oxyCODONE-acetaminophen (PERCOCET/ROXICET) 5-325 MG per tablet Take 1 tablet by mouth every 4 (four) hours as needed (pain).  12/07/13  Yes Historical Provider, MD  sevelamer carbonate (RENVELA) 800 MG tablet Take 1,600-2,400 mg by mouth See admin instructions. Take 3 tablets (2400 mg) by mouth 3 times daily with meals and 2 tablets (1600 mg) twice daily with snacks   Yes Historical Provider, MD  valACYclovir (VALTREX) 1000 MG tablet Take 1,000 mg by mouth 3 (three) times daily.  12/07/13  Yes Historical Provider, MD  ACCU-CHEK COMPACT STRIPS test strip  05/20/10   Historical Provider, MD   Liver Function Tests  Recent Labs Lab 12/09/13 1543  AST 16  ALT 17  ALKPHOS 52  BILITOT <0.2*  PROT 6.6  ALBUMIN 3.6   No results found for this basename: LIPASE, AMYLASE,  in the last 168 hours CBC  Recent Labs Lab 12/09/13 1543  WBC 4.7  NEUTROABS 2.7  HGB 11.1*  HCT 33.8*  MCV 92.6  PLT 607   Basic Metabolic Panel  Recent Labs Lab 12/09/13 1543  NA 134*  K 6.3*  CL 99  CO2 17*  GLUCOSE 56*  BUN 96*  CREATININE 17.30*  CALCIUM 9.1    Filed Vitals:   12/09/13 1804 12/09/13 1813 12/09/13 1815 12/09/13 1845  BP: 128/61 140/97  169/85  Pulse: 81 85 80 62  Temp:      TempSrc:      Resp: '14 22 16 19  ' SpO2: 99% 97% 100%    Exam Pt is calm, sedated, unresponsive, sleeping, lying flat no resp distress No rash, cyanosis or gangrene Sclera anicteric, throat clear No jvd, flat neck veins Chest clear bilat RRR 2/6 SEM rusb, no RG Abd soft, NTND. PD cath with clean exit site, no ascites No LE or UE edema, L forearm AVF is patent R leg with blistering zoster rash from groin to mid calf mostly anterior Neuro is sedated, comfortable  BUN 96, Cr 17.30, CO2 17, Na 134, K 6.3 Hb 11, wBC 4.7, plt 168 CXR,  EKG and UA -- normal  Home PD: 4 cycles per day, dwell is 2500 and usually does 2.5% fluid, occ 4.25%.      Assessment: 1 Altered mental status / azotemia - probably combination of uremia and medication side effects. CT degraded study but no CVA noted. Per family pt's renal numbers " have been  good". Not sure why azotemia is this severe. Will d/w PD staff in am.  For now will do PD via the cycler. He has an AVF which has been used in the past and is patent, but he needs to cooperative to be able to use a fistula with the large HD needles, so will reserve this an a back up plan if needed.  2 ESRD on PD 3 Herpes zoster, RLE - Valtrex was overdosed for ESRD but he didn't end up taking more than 1.5 gms 4 Gout 5 HTN 6 hx of CVA  7 Hx of PVD LE's 8 Hyperkalemia- no EKG changes.    Plan- cycler PD overnight , re-evaluate MS and labs in am. Consider HD if not improving. Hold Valtrex.  Get K down with PD if possible.    Kelly Splinter MD (pgr) [phone]    ([phone] 12/09/2013, 7:25 PM

## 2013-12-09 NOTE — ED Notes (Signed)
Patient transported to CT 

## 2013-12-10 ENCOUNTER — Encounter (HOSPITAL_COMMUNITY): Payer: Self-pay | Admitting: *Deleted

## 2013-12-10 DIAGNOSIS — E785 Hyperlipidemia, unspecified: Secondary | ICD-10-CM

## 2013-12-10 DIAGNOSIS — R7989 Other specified abnormal findings of blood chemistry: Secondary | ICD-10-CM

## 2013-12-10 LAB — HEPATITIS B SURFACE ANTIBODY,QUALITATIVE: Hep B S Ab: POSITIVE — AB

## 2013-12-10 LAB — BASIC METABOLIC PANEL
Anion gap: 19 — ABNORMAL HIGH (ref 5–15)
Anion gap: 19 — ABNORMAL HIGH (ref 5–15)
BUN: 95 mg/dL — ABNORMAL HIGH (ref 6–23)
BUN: 99 mg/dL — AB (ref 6–23)
CALCIUM: 8.9 mg/dL (ref 8.4–10.5)
CHLORIDE: 100 meq/L (ref 96–112)
CO2: 14 mEq/L — ABNORMAL LOW (ref 19–32)
CO2: 15 meq/L — AB (ref 19–32)
Calcium: 8.8 mg/dL (ref 8.4–10.5)
Chloride: 101 mEq/L (ref 96–112)
Creatinine, Ser: 17.27 mg/dL — ABNORMAL HIGH (ref 0.50–1.35)
Creatinine, Ser: 17.31 mg/dL — ABNORMAL HIGH (ref 0.50–1.35)
GFR calc Af Amer: 3 mL/min — ABNORMAL LOW (ref >90)
GFR calc Af Amer: 3 mL/min — ABNORMAL LOW (ref >90)
GFR calc non Af Amer: 2 mL/min — ABNORMAL LOW (ref >90)
GFR, EST NON AFRICAN AMERICAN: 2 mL/min — AB (ref >90)
Glucose, Bld: 108 mg/dL — ABNORMAL HIGH (ref 70–99)
Glucose, Bld: 56 mg/dL — ABNORMAL LOW (ref 70–99)
POTASSIUM: 5.2 meq/L (ref 3.7–5.3)
Potassium: 6 mEq/L — ABNORMAL HIGH (ref 3.7–5.3)
SODIUM: 134 meq/L — AB (ref 137–147)
Sodium: 134 mEq/L — ABNORMAL LOW (ref 137–147)

## 2013-12-10 LAB — LIPID PANEL
CHOLESTEROL: 96 mg/dL (ref 0–200)
HDL: 22 mg/dL — ABNORMAL LOW (ref >39)
LDL Cholesterol: 36 mg/dL (ref 0–99)
TRIGLYCERIDES: 190 mg/dL — AB (ref <150)
Total CHOL/HDL Ratio: 4.4 RATIO
VLDL: 38 mg/dL (ref 0–40)

## 2013-12-10 LAB — GLUCOSE, CAPILLARY
GLUCOSE-CAPILLARY: 96 mg/dL (ref 70–99)
GLUCOSE-CAPILLARY: 102 mg/dL — AB (ref 70–99)
GLUCOSE-CAPILLARY: 92 mg/dL (ref 70–99)
GLUCOSE-CAPILLARY: 89 mg/dL (ref 70–99)
Glucose-Capillary: 81 mg/dL (ref 70–99)

## 2013-12-10 LAB — CBC
HEMATOCRIT: 32.7 % — AB (ref 39.0–52.0)
HEMOGLOBIN: 10.7 g/dL — AB (ref 13.0–17.0)
MCH: 30.4 pg (ref 26.0–34.0)
MCHC: 32.7 g/dL (ref 30.0–36.0)
MCV: 92.9 fL (ref 78.0–100.0)
Platelets: 151 10*3/uL (ref 150–400)
RBC: 3.52 MIL/uL — ABNORMAL LOW (ref 4.22–5.81)
RDW: 15.2 % (ref 11.5–15.5)
WBC: 3.8 10*3/uL — ABNORMAL LOW (ref 4.0–10.5)

## 2013-12-10 LAB — HEPATITIS B SURFACE ANTIGEN: HEP B S AG: NEGATIVE

## 2013-12-10 LAB — CBG MONITORING, ED
GLUCOSE-CAPILLARY: 65 mg/dL — AB (ref 70–99)
Glucose-Capillary: 121 mg/dL — ABNORMAL HIGH (ref 70–99)

## 2013-12-10 LAB — MRSA PCR SCREENING: MRSA by PCR: NEGATIVE

## 2013-12-10 MED ORDER — ACETAMINOPHEN 325 MG PO TABS: 650.0000 mg | ORAL_TABLET | Freq: Four times a day (QID) | ORAL | Status: DC | PRN

## 2013-12-10 MED ORDER — HYDRALAZINE HCL 20 MG/ML IJ SOLN: 5.0000 mg | INTRAMUSCULAR | Status: DC | PRN

## 2013-12-10 MED ORDER — ALTEPLASE 2 MG IJ SOLR: 2.0000 mg | Freq: Once | INTRAMUSCULAR | Status: DC | PRN

## 2013-12-10 MED ORDER — LIDOCAINE HCL (PF) 1 % IJ SOLN: 5.0000 mL | INTRAMUSCULAR | Status: DC | PRN

## 2013-12-10 MED ORDER — LORAZEPAM 2 MG/ML IJ SOLN
INTRAMUSCULAR | Status: AC
  Administered 2013-12-10: 2 mg via INTRAVENOUS
  Filled 2013-12-10: qty 1

## 2013-12-10 MED ORDER — ONDANSETRON HCL 4 MG/2ML IJ SOLN: 4.0000 mg | Freq: Four times a day (QID) | INTRAMUSCULAR | Status: DC | PRN

## 2013-12-10 MED ORDER — SEVELAMER CARBONATE 800 MG PO TABS
2400.0000 mg | ORAL_TABLET | Freq: Three times a day (TID) | ORAL | Status: DC
  Administered 2013-12-11 – 2013-12-14 (×11): 2400 mg via ORAL
  Filled 2013-12-10 (×16): qty 3

## 2013-12-10 MED ORDER — CALCITRIOL 0.5 MCG PO CAPS
0.5000 ug | ORAL_CAPSULE | ORAL | Status: DC
  Administered 2013-12-11 – 2013-12-13 (×2): 0.5 ug via ORAL
  Filled 2013-12-10 (×2): qty 1

## 2013-12-10 MED ORDER — LORAZEPAM 2 MG/ML IJ SOLN: 1.0000 mg | Freq: Once | INTRAMUSCULAR | Status: AC

## 2013-12-10 MED ORDER — RENA-VITE PO TABS
1.0000 | ORAL_TABLET | Freq: Every day | ORAL | Status: DC
  Administered 2013-12-11 – 2013-12-14 (×4): 1 via ORAL
  Filled 2013-12-10 (×5): qty 1

## 2013-12-10 MED ORDER — DEXTROSE 5 % IV SOLN
500.0000 mg | INTRAVENOUS | Status: DC
  Filled 2013-12-10: qty 10

## 2013-12-10 MED ORDER — LORAZEPAM 2 MG/ML IJ SOLN
0.5000 mg | Freq: Once | INTRAMUSCULAR | Status: AC
  Administered 2013-12-10: 0.5 mg via INTRAVENOUS
  Filled 2013-12-10: qty 1

## 2013-12-10 MED ORDER — LIDOCAINE HCL (PF) 1 % IJ SOLN
INTRAMUSCULAR | Status: AC
  Administered 2013-12-10: 1 mg
  Filled 2013-12-10: qty 5

## 2013-12-10 MED ORDER — SODIUM CHLORIDE 0.9 % IV SOLN: 100.0000 mL | INTRAVENOUS | Status: DC | PRN

## 2013-12-10 MED ORDER — HYDRALAZINE HCL 50 MG PO TABS
50.0000 mg | ORAL_TABLET | Freq: Two times a day (BID) | ORAL | Status: DC
  Administered 2013-12-10 – 2013-12-14 (×8): 50 mg via ORAL
  Filled 2013-12-10 (×11): qty 1

## 2013-12-10 MED ORDER — LIDOCAINE-PRILOCAINE 2.5-2.5 % EX CREA: 1.0000 "application " | TOPICAL_CREAM | CUTANEOUS | Status: DC | PRN

## 2013-12-10 MED ORDER — ACETAMINOPHEN 650 MG RE SUPP: 650.0000 mg | Freq: Four times a day (QID) | RECTAL | Status: DC | PRN

## 2013-12-10 MED ORDER — DEXTROSE 50 % IV SOLN
INTRAVENOUS | Status: AC
  Filled 2013-12-10: qty 50

## 2013-12-10 MED ORDER — ALLOPURINOL 100 MG PO TABS
100.0000 mg | ORAL_TABLET | Freq: Every day | ORAL | Status: DC
  Administered 2013-12-11 – 2013-12-14 (×4): 100 mg via ORAL
  Filled 2013-12-10 (×5): qty 1

## 2013-12-10 MED ORDER — HEPARIN SODIUM (PORCINE) 5000 UNIT/ML IJ SOLN
5000.0000 [IU] | Freq: Three times a day (TID) | INTRAMUSCULAR | Status: DC
  Administered 2013-12-10 – 2013-12-14 (×11): 5000 [IU] via SUBCUTANEOUS
  Filled 2013-12-10 (×15): qty 1

## 2013-12-10 MED ORDER — NEPRO/CARBSTEADY PO LIQD
237.0000 mL | ORAL | Status: DC | PRN
  Filled 2013-12-10: qty 237

## 2013-12-10 MED ORDER — PENTAFLUOROPROP-TETRAFLUOROETH EX AERO: 1.0000 "application " | INHALATION_SPRAY | CUTANEOUS | Status: DC | PRN

## 2013-12-10 MED ORDER — METOPROLOL TARTRATE 25 MG PO TABS
25.0000 mg | ORAL_TABLET | Freq: Two times a day (BID) | ORAL | Status: DC
  Administered 2013-12-10 – 2013-12-14 (×8): 25 mg via ORAL
  Filled 2013-12-10 (×8): qty 1

## 2013-12-10 MED ORDER — SEVELAMER CARBONATE 800 MG PO TABS
1600.0000 mg | ORAL_TABLET | Freq: Two times a day (BID) | ORAL | Status: DC | PRN
  Filled 2013-12-10: qty 2

## 2013-12-10 MED ORDER — ATORVASTATIN CALCIUM 40 MG PO TABS
40.0000 mg | ORAL_TABLET | Freq: Every day | ORAL | Status: DC
  Administered 2013-12-11 – 2013-12-14 (×4): 40 mg via ORAL
  Filled 2013-12-10 (×5): qty 1

## 2013-12-10 MED ORDER — ASPIRIN EC 325 MG PO TBEC
325.0000 mg | DELAYED_RELEASE_TABLET | Freq: Every day | ORAL | Status: DC
  Administered 2013-12-11 – 2013-12-14 (×4): 325 mg via ORAL
  Filled 2013-12-10 (×5): qty 1

## 2013-12-10 MED ORDER — HEPARIN SODIUM (PORCINE) 1000 UNIT/ML DIALYSIS: 2000.0000 [IU] | INTRAMUSCULAR | Status: DC | PRN

## 2013-12-10 MED ORDER — HEPARIN SODIUM (PORCINE) 1000 UNIT/ML DIALYSIS
2000.0000 [IU] | INTRAMUSCULAR | Status: DC | PRN
  Filled 2013-12-10: qty 2

## 2013-12-10 MED ORDER — SODIUM CHLORIDE 0.9 % IJ SOLN
3.0000 mL | Freq: Two times a day (BID) | INTRAMUSCULAR | Status: DC
  Administered 2013-12-10 – 2013-12-14 (×3): 3 mL via INTRAVENOUS

## 2013-12-10 MED ORDER — VALACYCLOVIR HCL 500 MG PO TABS
1000.0000 mg | ORAL_TABLET | Freq: Three times a day (TID) | ORAL | Status: DC
  Filled 2013-12-10 (×3): qty 2

## 2013-12-10 MED ORDER — DEXTROSE 5 % IV SOLN
500.0000 mg | INTRAVENOUS | Status: DC
  Administered 2013-12-10 – 2013-12-13 (×4): 500 mg via INTRAVENOUS
  Filled 2013-12-10 (×6): qty 10

## 2013-12-10 MED ORDER — ALBUTEROL SULFATE (2.5 MG/3ML) 0.083% IN NEBU: 2.5000 mg | INHALATION_SOLUTION | RESPIRATORY_TRACT | Status: DC | PRN

## 2013-12-10 MED ORDER — DEXTROSE 50 % IV SOLN
1.0000 | Freq: Once | INTRAVENOUS | Status: AC
  Administered 2013-12-10: 50 mL via INTRAVENOUS

## 2013-12-10 MED ORDER — HEPARIN SODIUM (PORCINE) 1000 UNIT/ML DIALYSIS
1000.0000 [IU] | INTRAMUSCULAR | Status: DC | PRN
  Filled 2013-12-10: qty 1

## 2013-12-10 MED ORDER — HEPARIN SODIUM (PORCINE) 1000 UNIT/ML DIALYSIS
1000.0000 [IU] | INTRAMUSCULAR | Status: DC | PRN
Start: 2013-12-10 — End: 2013-12-10

## 2013-12-10 MED ORDER — ALTEPLASE 2 MG IJ SOLR
2.0000 mg | Freq: Once | INTRAMUSCULAR | Status: AC | PRN
  Filled 2013-12-10: qty 2

## 2013-12-10 MED ORDER — ONDANSETRON HCL 4 MG PO TABS: 4.0000 mg | ORAL_TABLET | Freq: Four times a day (QID) | ORAL | Status: DC | PRN

## 2013-12-10 MED ORDER — INSULIN ASPART 100 UNIT/ML ~~LOC~~ SOLN
0.0000 [IU] | SUBCUTANEOUS | Status: DC
Start: 2013-12-10 — End: 2013-12-11

## 2013-12-10 MED ORDER — NEPRO/CARBSTEADY PO LIQD: 237.0000 mL | ORAL | Status: DC | PRN

## 2013-12-10 MED ORDER — METOPROLOL TARTRATE 100 MG PO TABS
100.0000 mg | ORAL_TABLET | Freq: Two times a day (BID) | ORAL | Status: DC
  Filled 2013-12-10 (×2): qty 1

## 2013-12-10 NOTE — ED Provider Notes (Signed)
Medical screening examination/treatment/procedure(s) were conducted as a shared visit with non-physician practitioner(s) and myself.  I personally evaluated the patient during the encounter.   EKG Interpretation   Date/Time:  Saturday December 09 2013 17:20:48 EDT Ventricular Rate:  80 PR Interval:  164 QRS Duration: 94 QT Interval:  381 QTC Calculation: 439 R Axis:   -41 Text Interpretation:  Sinus rhythm Left anterior fascicular block  Confirmed by BEATON  MD, ROBERT ([id number]) on 12/09/2013 6:05:02 PM     Patient with altered mental status. Worsening renal function. Patient is peritoneal dialysis. Creatinine is up to 17 from baseline of around 8-11. Mild hyperkalemia. Will admit to internal medicine  Sean Best. Sean Best, Sean Best 12/10/13 [phone]

## 2013-12-10 NOTE — Progress Notes (Signed)
Patient's blood pressure continues to rise.  Creatinine > 17.  Calls made to renal and Dr. Sherral Hammers as patient is increasingly confused and is unable to take po medications.  B/P greater than 99991111 systolic.

## 2013-12-10 NOTE — Progress Notes (Signed)
[facility] TEAM 1 - Stepdown/ICU TEAM Progress Note  Sean Best [id number] DOB: Feb 12, 1934 DOA: 12/09/2013 PCP: [name], MD  Admit HPI / Brief Narrative: Sean Best is a 80 y.o.BM PMHx ESRD on peritoneal dialysis since April 2015, was on hemodialysis prior to that, CVA without residual deficits, diet-controlled DM 2, HTN, HLD, prostate cancer, gout, current shingles Presented to the ED with altered mental status. Patient unable to provide significant history secondary to altered mental status. History obtained from patient's daughter who works with the [employer]. Patient lives alone and is independent of activities of daily living. He does manual peritoneal dialysis-4 cycles per day. Daughter was with patient on telemetry in last night and he was in his usual state of health. Patient's friend stayed with him. This morning daughter was alerted that patient was confused overnight, walking from room to room, talking to his demise wife, scattered his room. No reported fever, chills, nausea, vomiting, pain, dyspnea. When daughter went to his room, she found him to be confused and hallucinating. He was recently started on Valtrex and opioids for right lower extremity shingles, 3 days ago. In the ED, he was apparently some coherent initially but subsequently became agitated. He was given a milligram of Ativan to obtain a head CT. Patient is slightly somnolent at this time. In the ED, potassium 6.3, glucose 56, BUN 96, creatinine 17.3, hemoglobin 11.1 and chest x-ray shows cardiomegaly without edema or consolidation. A repeat CBG 60. Patient is getting half an amp of D50. Hospitalist admission requested.   HPI/Subjective: 10/11 paged by RN secondary to patient being confused, combative. Quickly review chart patient uremic/azotemic contacted Dr. Roney Jaffe (nephrology) and requested emergent HD; he concurred. Upon arrival at bedside patient had been sedated in order to achieve  HD.  Assessment/Plan:  Acute encephalopathy -Most likely multifactorialuremic, azotemia, opioids and hypoglycemia. -Emergent hemodialysis to correct multiple metabolic derangements  -No clinical focus of infection - UA not impressive. Chest x-ray negative.  -CT head; negative.   ESRD on PD -PD will BE held, emergent HD per nephrology  Hyperkalemia/anion gap metabolic acidosis/uremia:  -Emergent HD; functioning left forearm AV fistula -Followup labs closely  DM 2 with hypoglycemia:  -Diet controlled at home. -Obtain A1c  -Obtain lipid panel  -Control with sensitive   HLD -Continue Lipitor 40 mg daily -Obtain lipid panel  Hypertension:  -Initially Control with HD  -Hydralazine IV 5 mg SBP> 180 or DBP> 100   Right lower extremity shingles:  -Airbone/contact isolation.  -Patient initially overdosed on Valtrex for current GFR  -Consult with pharmacy start Acyclovir IV 500 mg daily.  Anemia:  -Secondary to chronic kidney disease. Follow CBCs     Code Status: FULL Family Communication: no family present at time of exam Disposition Plan: Resolution metabolic encephalopathy    Consultants: Dr. Roney Jaffe (nephrology)   Procedure/Significant Events: 10/10 CT head without contrast;No acute abnormality   Culture 10/11 MRSA by PCR negative Blood culture pending    Antibiotics: Acyclovir 10/11>>  DVT prophylaxis: Heparin subcutaneous   Devices NA   LINES / TUBES:  1/5 peritoneal catheter right lower abdomen 10/11 left femoral hemodialysis catheter    Continuous Infusions: . dextrose 20 mL/hr at 12/09/13 1845  . dialysis solution 1.5% low-MG/low-CA      Objective: VITAL SIGNS: Temp: 97.5 F (36.4 C) (10/11 0748) Temp Source: Axillary (10/11 0748) BP: 185/110 mmHg (10/11 0901) Pulse Rate: 86 (10/11 0901) SPO2; FIO2:   Intake/Output Summary (Last 24 hours) at 12/10/13  [place] filed at 12/10/13 0900  Gross per 24 hour  Intake     120 ml  Output      0 ml  Net    120 ml     Exam: General: A./O. x0 (sedated), NAD No acute respiratory distress Lungs: Clear to auscultation bilaterally without wheezes or crackles Cardiovascular: Regular rate and rhythm without murmur gallop or rub normal S1 and S2 Abdomen: Nontender, nondistended, soft, bowel sounds positive, no rebound, no ascites, no appreciable mass, peritoneal dialysis in place negative discharge/sign of infection Extremities: No significant cyanosis, clubbing, or edema bilateral lower extremities, left femoral dialysis catheter in place, RLE shingles rash with open posterior was dermatomal L2, L3     Data Reviewed: Basic Metabolic Panel:  Recent Labs Lab 12/09/13 1543 12/10/13 0001 12/10/13 0500  NA 134* 134* 134*  K 6.3* 6.0* 5.2  CL 99 101 100  CO2 17* 14* 15*  GLUCOSE 56* 56* 108*  BUN 96* 99* 95*  CREATININE 17.30* 17.27* 17.31*  CALCIUM 9.1 8.9 8.8   Liver Function Tests:  Recent Labs Lab 12/09/13 1543  AST 16  ALT 17  ALKPHOS 52  BILITOT <0.2*  PROT 6.6  ALBUMIN 3.6   No results found for this basename: LIPASE, AMYLASE,  in the last 168 hours  Recent Labs Lab 12/09/13 1730  AMMONIA 50   CBC:  Recent Labs Lab 12/09/13 1543 12/10/13 0500  WBC 4.7 3.8*  NEUTROABS 2.7  --   HGB 11.1* 10.7*  HCT 33.8* 32.7*  MCV 92.6 92.9  PLT 168 151   Cardiac Enzymes: No results found for this basename: CKTOTAL, CKMB, CKMBINDEX, TROPONINI,  in the last 168 hours BNP (last 3 results) No results found for this basename: PROBNP,  in the last 8760 hours CBG:  Recent Labs Lab 12/09/13 2245 12/10/13 0157 12/10/13 0324 12/10/13 0423 12/10/13 0753  GLUCAP 70 65* 121* 96 102*    Recent Results (from the past 240 hour(s))  MRSA PCR SCREENING     Status: None   Collection Time    12/10/13  4:25 AM      Result Value Ref Range Status   MRSA by PCR NEGATIVE  NEGATIVE Final   Comment:            The GeneXpert MRSA Assay (FDA      approved for NASAL specimens     only), is one component of a     comprehensive MRSA colonization     surveillance program. It is not     intended to diagnose MRSA     infection nor to guide or     monitor treatment for     MRSA infections.     Studies:  Recent x-ray studies have been reviewed in detail by the Attending Physician  Scheduled Meds:  Scheduled Meds: . allopurinol  100 mg Oral Daily  . aspirin EC  325 mg Oral Daily  . atorvastatin  40 mg Oral Daily  . [START ON 12/11/2013] calcitRIOL  0.5 mcg Oral Q M,W,F  . dextrose      . dialysis solution 1.5% low-MG/low-CA   Intraperitoneal Once in dialysis  . heparin  5,000 Units Subcutaneous 3 times per day  . hydrALAZINE  50 mg Oral BID  . metoprolol  25 mg Oral BID  . multivitamin  1 tablet Oral Daily  . sevelamer carbonate  2,400 mg Oral TID WC  . sodium chloride  3 mL Intravenous Q12H  . valACYclovir  1,000 mg Oral TID    Time spent on care of this patient: 40 mins   Allie Bossier , MD   [facility] Office  [phone] Pager [phone]  On-Call/Text Page:      [email or web address]      password [email or web address]  If 7PM-7AM, please contact night-coverage [email or web address] Password [email or web address] 12/10/2013, 9:34 AM   LOS: 1 day

## 2013-12-10 NOTE — ED Notes (Signed)
Pt very combative and restless, [place] notified and order for Ativan 0.5 mg IV gotten, pt resting now comfortable, CBG low 65 D50 IV given, we'll continue to monitor.

## 2013-12-10 NOTE — Procedures (Signed)
Under sterile conditions and using US guidance, a 3- lumen 20- cm Trialysis temporary hemodialysis catheter was placed into the L femoral vein w/o difficulty.  Sedation used was 1 mg Ativan for agitation.  No complications. Indication was uremia w AMS.  Cath ready to use.   Kelly Splinter MD (pgr) [phone]    ([phone] 12/10/2013, 11:59 AM

## 2013-12-10 NOTE — Progress Notes (Signed)
12/10/2013 12:13 PM  Pt completed four fills before HD initiated.  Attempted stat drain on patient,however, patient was very slow to drain and only drained about 207cc out over about fifteen minutes.  Dr. Jonnie Finner was at the bedside at this time, orders received to go ahead and cap patient off despite not being able to drain due to patient being placed on hemodialysis anyway.  Pt disconnected from cycler and capped off without issues, pt tolerated well.  No cycler orders at this time.  Will monitor. Princella Pellegrini

## 2013-12-10 NOTE — Progress Notes (Signed)
House coverage made this RN aware that patient was to be moved to 2H18.  At approximately 86, ED RN made this RN aware that patient had been moved.  I called and spoke to patient's nurse on [place] and they stated it was ok to put patient on cycler.  Patient connected to cycler with no difficulties.  Had to call patient's daughter, [name], and ask if father was empty or full.  Per his daughter, the patient had become shaky and was unable to fill.  Therefore, he was currently empty.  I bypassed the drain option and he began to immediately to fill.  Will continue to monitor the patient  Earleen Reaper RN-BC, WTA

## 2013-12-10 NOTE — Procedures (Signed)
I was present at this dialysis session, have reviewed the session itself and made  appropriate changes  Kelly Splinter MD (pgr) [phone]    ([phone] 12/10/2013, 11:57 AM

## 2013-12-10 NOTE — Progress Notes (Signed)
  [facility] Progress Note   Subjective: Confused, moaning and calling out  Filed Vitals:   12/10/13 0840 12/10/13 0845 12/10/13 0900 12/10/13 0901  BP: 163/112 172/106 185/110 185/110  Pulse: 85 81 87 86  Temp:      TempSrc:      Resp: 19 17 24 19   Height:      Weight:      SpO2: 99% 100% 99% 99%   Exam: Pt confused, agitated, moving about, mitts on No jvd, flat neck veins  Chest clear bilat  RRR 2/6 SEM rusb, no RG  Abd soft, NTND. PD cath with clean exit site, no ascites  No LE or UE edema, L forearm AVF is patent  R leg with blistering zoster rash from groin to mid calf mostly anterior  Neuro is confused, nonfocal  CXR, EKG and UA -- normal   Home PD: 4 cycles per day, dwell is 2500 and usually does 2.5% fluid, occ 4.25%  Assessment:  1 Altered mental status / azotemia - will need HD today, will place temp cath as agitation precludes AVF use  2 ESRD on PD  3 Herpes zoster, RLE - airborne contact 4 Gout  5 HTN  6 hx of CVA  7 Hx of PVD LE's  8 Hyperkalemia- improving  Plan- temp cath and HD today and tomorrow    Kelly Splinter MD  pager [phone]    cell [phone]  12/10/2013, 10:00 AM     Recent Labs Lab 12/09/13 1543 12/10/13 0001 12/10/13 0500  NA 134* 134* 134*  K 6.3* 6.0* 5.2  CL 99 101 100  CO2 17* 14* 15*  GLUCOSE 56* 56* 108*  BUN 96* 99* 95*  CREATININE 17.30* 17.27* 17.31*  CALCIUM 9.1 8.9 8.8    Recent Labs Lab 12/09/13 1543  AST 16  ALT 17  ALKPHOS 52  BILITOT <0.2*  PROT 6.6  ALBUMIN 3.6    Recent Labs Lab 12/09/13 1543 12/10/13 0500  WBC 4.7 3.8*  NEUTROABS 2.7  --   HGB 11.1* 10.7*  HCT 33.8* 32.7*  MCV 92.6 92.9  PLT 168 151   . allopurinol  100 mg Oral Daily  . aspirin EC  325 mg Oral Daily  . atorvastatin  40 mg Oral Daily  . [START ON 12/11/2013] calcitRIOL  0.5 mcg Oral Q M,W,F  . dextrose      . dialysis solution 1.5% low-MG/low-CA   Intraperitoneal Once in dialysis  . heparin  5,000  Units Subcutaneous 3 times per day  . hydrALAZINE  50 mg Oral BID  . metoprolol  25 mg Oral BID  . multivitamin  1 tablet Oral Daily  . sevelamer carbonate  2,400 mg Oral TID WC  . sodium chloride  3 mL Intravenous Q12H  . valACYclovir  1,000 mg Oral TID   . dextrose 20 mL/hr at 12/09/13 1845  . dialysis solution 1.5% low-MG/low-CA     acetaminophen, acetaminophen, albuterol, ondansetron (ZOFRAN) IV, ondansetron, sevelamer carbonate

## 2013-12-10 NOTE — Progress Notes (Signed)
Called to the ED because patient had pulled cap off the end of his peritoneal dialysis catheter at approximately 0030.  End of catheter soaked.  New cap placed.  Still waiting transfer from the ED to 2 Heart.  Have contacted House Coverage multiple times and they are working on it.  Cannot hook patient up to cycler until in new room.  [employer] RN-BC, WTA.

## 2013-12-11 DIAGNOSIS — G9340 Encephalopathy, unspecified: Secondary | ICD-10-CM

## 2013-12-11 LAB — GLUCOSE, CAPILLARY
GLUCOSE-CAPILLARY: 104 mg/dL — AB (ref 70–99)
GLUCOSE-CAPILLARY: 109 mg/dL — AB (ref 70–99)
GLUCOSE-CAPILLARY: 150 mg/dL — AB (ref 70–99)
Glucose-Capillary: 111 mg/dL — ABNORMAL HIGH (ref 70–99)
Glucose-Capillary: 119 mg/dL — ABNORMAL HIGH (ref 70–99)
Glucose-Capillary: 84 mg/dL (ref 70–99)

## 2013-12-11 LAB — COMPREHENSIVE METABOLIC PANEL
ALT: 14 U/L (ref 0–53)
ANION GAP: 17 — AB (ref 5–15)
AST: 19 U/L (ref 0–37)
Albumin: 2.9 g/dL — ABNORMAL LOW (ref 3.5–5.2)
Alkaline Phosphatase: 47 U/L (ref 39–117)
BUN: 36 mg/dL — AB (ref 6–23)
CALCIUM: 8.3 mg/dL — AB (ref 8.4–10.5)
CO2: 22 mEq/L (ref 19–32)
CREATININE: 9.34 mg/dL — AB (ref 0.50–1.35)
Chloride: 97 mEq/L (ref 96–112)
GFR calc non Af Amer: 5 mL/min — ABNORMAL LOW (ref >90)
GFR, EST AFRICAN AMERICAN: 5 mL/min — AB (ref >90)
GLUCOSE: 96 mg/dL (ref 70–99)
Potassium: 4.1 mEq/L (ref 3.7–5.3)
Sodium: 136 mEq/L — ABNORMAL LOW (ref 137–147)
TOTAL PROTEIN: 5.9 g/dL — AB (ref 6.0–8.3)
Total Bilirubin: 0.3 mg/dL (ref 0.3–1.2)

## 2013-12-11 LAB — CBC WITH DIFFERENTIAL/PLATELET
Basophils Absolute: 0 10*3/uL (ref 0.0–0.1)
Basophils Relative: 0 % (ref 0–1)
Eosinophils Absolute: 0 10*3/uL (ref 0.0–0.7)
Eosinophils Relative: 1 % (ref 0–5)
HCT: 35.1 % — ABNORMAL LOW (ref 39.0–52.0)
HEMOGLOBIN: 11.3 g/dL — AB (ref 13.0–17.0)
LYMPHS ABS: 1.6 10*3/uL (ref 0.7–4.0)
LYMPHS PCT: 28 % (ref 12–46)
MCH: 30.7 pg (ref 26.0–34.0)
MCHC: 32.2 g/dL (ref 30.0–36.0)
MCV: 95.4 fL (ref 78.0–100.0)
MONOS PCT: 14 % — AB (ref 3–12)
Monocytes Absolute: 0.8 10*3/uL (ref 0.1–1.0)
NEUTROS PCT: 57 % (ref 43–77)
Neutro Abs: 3.3 10*3/uL (ref 1.7–7.7)
Platelets: 169 10*3/uL (ref 150–400)
RBC: 3.68 MIL/uL — AB (ref 4.22–5.81)
RDW: 15.4 % (ref 11.5–15.5)
WBC: 5.8 10*3/uL (ref 4.0–10.5)

## 2013-12-11 LAB — PHOSPHORUS: PHOSPHORUS: 3.6 mg/dL (ref 2.3–4.6)

## 2013-12-11 LAB — HEMOGLOBIN A1C
Hgb A1c MFr Bld: 5.3 % (ref <5.7)
Mean Plasma Glucose: 105 mg/dL (ref <117)

## 2013-12-11 LAB — MAGNESIUM: Magnesium: 1.8 mg/dL (ref 1.5–2.5)

## 2013-12-11 MED ORDER — INSULIN ASPART 100 UNIT/ML ~~LOC~~ SOLN
0.0000 [IU] | Freq: Three times a day (TID) | SUBCUTANEOUS | Status: DC
  Administered 2013-12-13 – 2013-12-14 (×2): 1 [IU] via SUBCUTANEOUS

## 2013-12-11 MED ORDER — CETYLPYRIDINIUM CHLORIDE 0.05 % MT LIQD
7.0000 mL | Freq: Two times a day (BID) | OROMUCOSAL | Status: DC
  Administered 2013-12-11 – 2013-12-14 (×4): 7 mL via OROMUCOSAL

## 2013-12-11 NOTE — Evaluation (Signed)
Occupational Therapy Evaluation Patient Details Name: Sean Best MRN: [id number] DOB: [birth year] Today's Date: 12/11/2013    History of Present Illness is a 80 y.o. male with history of ESRD on peritoneal dialysis since April 2015, was on hemodialysis prior to that, CVA without residual deficits, diet-controlled DM 2, HTN, HLD, prostate cancer, gout, recent shingles, presented to the ED with altered mental status   Clinical Impression   Pt presents with generalized weakness and orthostatic BP changes during activity. Nursing made aware. He lives alone with daughter checking by in the evenings. Feel he will benefit from acute OT to progress safety and independence with self care tasks. Recommended to pt to discuss arranging more assist at d/c and he will talk with his family.     Follow Up Recommendations  Supervision/Assistance - 24 hour;Home health OT;Other (comment) (depending on help available. He will need 24/7 initially) If not, he may need SNF.   Equipment Recommendations  3 in 1 bedside comode    Recommendations for Other Services       Precautions / Restrictions Precautions Precautions: Fall Precaution Comments: check orthostatics      Mobility Bed Mobility Overal bed mobility: Needs Assistance Bed Mobility: Supine to Sit     Supine to sit: Min guard;HOB elevated        Transfers Overall transfer level: Needs assistance Equipment used: Rolling walker (2 wheeled) Transfers: Sit to/from Stand Sit to Stand: Min guard         General transfer comment: verbal cues for hand placement.    Balance                                            ADL Overall ADL's : Needs assistance/impaired Eating/Feeding: Independent;Sitting   Grooming: Wash/dry hands;Set up;Sitting;Supervision/safety   Upper Body Bathing: Supervision/ safety;Set up;Sitting   Lower Body Bathing: Minimal assistance;Sit to/from stand   Upper Body Dressing : Set  up;Sitting;Supervision/safety   Lower Body Dressing: Minimal assistance;Sit to/from stand   Toilet Transfer: Minimal assistance;Stand-pivot;RW;BSC   Toileting- Clothing Manipulation and Hygiene: Minimal assistance;Sit to/from stand         General ADL Comments: Pt noted to be shaky in UEs with standing at the walker.  Pt states he does feel weak and hasnt been up since admission till now. Pt states he has a daughter that lives nearby and checks in on him in the evenings. DIscussed with pt recommendation for initial 24/7 supervision for safety versus SNF. He would like to discuss with family. Noted red in BM that he had on [facility] this visit. Nursing called to room and made aware. Pt with orthostatic BP changes. See vitals in pain/vital secion of note.      Vision                     Perception     Praxis      Pertinent Vitals/Pain Pain Assessment: No/denies pain BP supine 140/64; 123/87 sitting HR 93 and sats 95% on RA; standing 161/70 HR 98, sats 98%. BP sitting again 140/88. Nursing made aware.     Hand Dominance     Extremity/Trunk Assessment Upper Extremity Assessment Upper Extremity Assessment: Generalized weakness           Communication Communication Communication: No difficulties   Cognition Arousal/Alertness: Awake/alert Behavior During Therapy: WFL for tasks assessed/performed Overall Cognitive Status: Within Functional  Limits for tasks assessed                     General Comments       Exercises       Shoulder Instructions      Home Living Family/patient expects to be discharged to:: Private residence Living Arrangements: Children Available Help at Discharge: Available PRN/intermittently;Family Type of Home: House Home Access: Ramped entrance     Home Layout: One level     Bathroom Shower/Tub: Teacher, early years/pre: Standard     Home Equipment: Cane - single point          Prior Functioning/Environment Level  of Independence: Independent        Comments: daughter comes by in the evenings to check on pt.    OT Diagnosis: Generalized weakness   OT Problem List: Decreased strength;Decreased knowledge of use of DME or AE   OT Treatment/Interventions: Self-care/ADL training;Patient/family education;Therapeutic activities;DME and/or AE instruction    OT Goals(Current goals can be found in the care plan section) Acute Rehab OT Goals Patient Stated Goal: stronger OT Goal Formulation: With patient Time For Goal Achievement: 12/25/13 Potential to Achieve Goals: Good  OT Frequency: Min 2X/week   Barriers to D/C:            Co-evaluation              End of Session Equipment Utilized During Treatment: Gait belt;Rolling walker  Activity Tolerance: Patient tolerated treatment well;Other (comment) (no complaint of dizziness despite orthostatic BP changes) Patient left: in bed;with call bell/phone within reach   Time: 1125-1215 OT Time Calculation (min): 50 min Charges:  OT General Charges $OT Visit: 1 Procedure OT Evaluation $Initial OT Evaluation Tier I: 1 Procedure OT Treatments $Self Care/Home Management : 8-22 mins $Therapeutic Activity: 23-37 mins G-Codes:    Jules Schick [id number] 12/11/2013, 12:30 PM

## 2013-12-11 NOTE — Progress Notes (Signed)
Sean Best TEAM 1 - Stepdown/ICU TEAM Progress Note  Sean Best [id number] DOB: 10-13-1933 DOA: 12/09/2013 PCP: [name], MD  Admit HPI / Brief Narrative: 80 y.o.M Hx ESRD on peritoneal dialysis since April 2015 (on hemodialysis prior to that), CVA without residual deficits, diet-controlled DM 2, HTN, HLD, prostate cancer, gout, and current shingles who presented to the ED with altered mental status. Patient lives alone and is independent in activities of daily living. He does manual peritoneal dialysis-4 cycles per day. The daughter was alerted that patient was confused, walking from room to room, talking to his demised wife. No reported fever, chills, nausea, vomiting, pain, dyspnea. He was recently started on Valtrex and opioids for right lower extremity shingles, 3 days prior.   In the ED he became agitated. He was given a milligram of Ativan to obtain a head CT. Potassium 6.3, glucose 56, BUN 96, creatinine 17.3, hemoglobin 11.1 and chest x-ray noted cardiomegaly without edema or consolidation.   Since his admission, the pt has undergone placement of a HD cath, and initiation of HD.  His acyclovir dose has been adjusted.  With these measures, he has improved markedly in regard to his mental status.  HPI/Subjective: Mental status appears to be at or very near baseline.  Pt is alert and oriented x4.  C/o modest pain in the R thigh.  Denies cp, sob, n/v, or abdom pain.    Assessment/Plan:  Acute encephalopathy -Most likely multifactorial:  uremia, opioids, acyclovir, and hypoglycemia -ongoing hemodialysis as per Nephrology  -No clinical focus of infection:  UA not impressive, Chest x-ray negative -CT head negative  ESRD on PD -emergent HD per Nephrology  Hyperkalemia / anion gap metabolic acidosis/uremia -resolved w/ HD  DM 2 with hypoglycemia  -Diet controlled at home. -A1c pending   HLD -Continue Lipitor 40 mg daily -lipid panel favorable  Hypertension -BP  currently reasonably controlled   Right lower extremity shingles -Airbone/contact isolation -Patient initially overdosed on Valtrex for current GFR - consulted with pharmacy > start Acyclovir IV 500 mg daily  Anemia  -Secondary to chronic kidney disease - follow CBCs  Code Status: FULL Family Communication: no family present at time of exam Disposition Plan: stable for transfer to renal floor   Consultants: Dr. Roney Jaffe (Nephrology)  Procedure/Significant Events: 10/10 CT head without contrast - No acute abnormality  Antibiotics: Acyclovir 10/11 >  DVT prophylaxis: Heparin subcutaneous   LINES / TUBES:  1/5 peritoneal catheter right lower abdomen 10/11 left femoral hemodialysis catheter  Objective: Blood pressure 152/88, pulse 96, temperature 98.5 F (36.9 C), temperature source Oral, resp. rate 17, height 6' (1.829 m), weight 94.3 kg (207 lb 14.3 oz), SpO2 96.00%.  Intake/Output Summary (Last 24 hours) at 12/11/13 0758 Last data filed at 12/11/13 0600  Gross per 24 hour  Intake    690 ml  Output   -500 ml  Net   1190 ml   Exam: General: No acute respiratory distress - alert and conversant  Lungs: Clear to auscultation bilaterally without wheezes or crackles Cardiovascular: Regular rate and rhythm without murmur gallop or rub  Abdomen: Nontender, nondistended, soft, bowel sounds positive, no rebound, no ascites, no appreciable mass, peritoneal dialysis cath in place w/o discharge/sign of infection Extremities: No significant cyanosis, clubbing, or edema bilateral lower extremities, left femoral dialysis catheter in place, R thigh vesicular rash in L2/L3  dermatomal distribution   Data Reviewed: Basic Metabolic Panel:  Recent Labs Lab 12/09/13 1543 12/10/13 0001 12/10/13 0500 12/11/13 [id number]  NA 134* 134* 134* 136*  K 6.3* 6.0* 5.2 4.1  CL 99 101 100 97  CO2 17* 14* 15* 22  GLUCOSE 56* 56* 108* 96  BUN 96* 99* 95* 36*  CREATININE 17.30* 17.27* 17.31*  9.34*  CALCIUM 9.1 8.9 8.8 8.3*  MG  --   --   --  1.8  PHOS  --   --   --  3.6   Liver Function Tests:  Recent Labs Lab 12/09/13 1543 12/11/13 0333  AST 16 19  ALT 17 14  ALKPHOS 52 47  BILITOT <0.2* 0.3  PROT 6.6 5.9*  ALBUMIN 3.6 2.9*    Recent Labs Lab 12/09/13 1730  AMMONIA 50   CBC:  Recent Labs Lab 12/09/13 1543 12/10/13 0500 12/11/13 0333  WBC 4.7 3.8* 5.8  NEUTROABS 2.7  --  3.3  HGB 11.1* 10.7* 11.3*  HCT 33.8* 32.7* 35.1*  MCV 92.6 92.9 95.4  PLT 168 151 169   CBG:  Recent Labs Lab 12/10/13 1232 12/10/13 1706 12/10/13 2032 12/11/13 0045 12/11/13 0521  GLUCAP 81 92 89 111* 104*    Recent Results (from the past 240 hour(s))  MRSA PCR SCREENING     Status: None   Collection Time    12/10/13  4:25 AM      Result Value Ref Range Status   MRSA by PCR NEGATIVE  NEGATIVE Final   Comment:            The GeneXpert MRSA Assay (FDA     approved for NASAL specimens     only), is one component of a     comprehensive MRSA colonization     surveillance program. It is not     intended to diagnose MRSA     infection nor to guide or     monitor treatment for     MRSA infections.     Studies:  Recent x-ray studies have been reviewed in detail by the Attending Physician  Scheduled Meds:  Scheduled Meds: . acyclovir  500 mg Intravenous Q24H  . allopurinol  100 mg Oral Daily  . antiseptic oral rinse  7 mL Mouth Rinse BID  . aspirin EC  325 mg Oral Daily  . atorvastatin  40 mg Oral Daily  . calcitRIOL  0.5 mcg Oral Q M,W,F  . dialysis solution 1.5% low-MG/low-CA   Intraperitoneal Once in dialysis  . heparin  5,000 Units Subcutaneous 3 times per day  . hydrALAZINE  50 mg Oral BID  . insulin aspart  0-9 Units Subcutaneous 6 times per day  . metoprolol  25 mg Oral BID  . multivitamin  1 tablet Oral Daily  . sevelamer carbonate  2,400 mg Oral TID WC  . sodium chloride  3 mL Intravenous Q12H    Time spent on care of this patient: 35  mins  [name], MD [facility] For Consults/Admissions - Flow Manager - [phone] Office  [phone] Pager [phone]  On-Call/Text Page:      [email or web address]      password [facility]  12/11/2013, 7:58 AM   LOS: 2 days

## 2013-12-11 NOTE — Care Management Note (Signed)
    Page 1 of 1   12/11/2013     10:25:14 AM CARE MANAGEMENT NOTE 12/11/2013  Patient:  Sean Best, Sean Best   Account Number:  [id number]  Date Initiated:  12/11/2013  Documentation initiated by:  Elissa Hefty  Subjective/Objective Assessment:   adm w encepalopathy     Action/Plan:   lives w wife, pcp dr Jori Moll polite   Anticipated DC Date:     Anticipated DC Plan:           Choice offered to / List presented to:             Status of service:   Medicare Important Message given?   (If response is "NO", the following Medicare IM given date fields will be blank) Date Medicare IM given:   Medicare IM given by:   Date Additional Medicare IM given:   Additional Medicare IM given by:    Discharge Disposition:    Per UR Regulation:  Reviewed for med. necessity/level of care/duration of stay  If discussed at [place] of Stay Meetings, dates discussed:    Comments:

## 2013-12-11 NOTE — Progress Notes (Addendum)
[facility] Progress Note   Subjective:  Much more alert this AM Not much recollection of events of past 24 hours Does not remember getting femoral catheter or hemodialysis yesterday Says "what do I need to do to live" Acknowledges that he would sometimes do only 3 PD exchanges per day  Had an urgent HD yesterday for 3.5 hours per fem cath (has AVF but was too agitated to safely use yesterday)   Filed Vitals:   12/11/13 0500 12/11/13 0600 12/11/13 0648 12/11/13 0700  BP: 152/74 83/44 124/76 152/88  Pulse: 99 96 100 96  Temp:    98.5 F (36.9 C)  TempSrc:    Oral  Resp: 16 15 16 17   Height:      Weight:      SpO2: 98% 97% 97% 96%   Exam: Pt awake, alert + twitching/some myoclonic jerking but not agitated Chest clear  RRR 2/6 SEM rusb, no rub Abd soft, NTND. PD cath with clean exit site No LE or UE edema, L forearm AVF is patent  R leg with blistering zoster rash from groin to mid calf mostly anterior/medial  Dialysis catheter in groin with dry dressing (placed 10/11)  CXR, EKG and UA -- normal   Home PD: 4 cycles per day, dwell is 2500 and usually does 2.5% fluid, occ 4.25%  Assessment: 80 yo AAM with ESRD, most recently on CAPD 4 exchanges/day, presented with AMS, herpes zoster of RLE (had been started on non-dose adjusted Valtrex + narcotics) but also with very abnormal labs - creatinine of 17.5 (not expected in well dialyzed patient) and probable clinical uremia superimposed.  Clinical improvement with renally dose adjusted antivirals, discontinuation of narcotics, AND with marked improvement in labs after a single hemodialysis treatment - unclear if "failure of PD or just failure to do adequate PD" on top of inappropriately dosed antiviral medication  1 Altered mental status / azotemia - marked improvement after HD (+ renally dose adjusted antivirals)  I suspect clinical uremia part of this picture given terrible labs on admission.   2 ESRD on PD. Will  try to get information from home training re pt's transport characteristics as to whether 4 CAPD exchanges per day "should" be adequate.Marland KitchenMarland KitchenIn the meantime will do another hemodialysis, get his labs cleaned up, then decide on appropriateness of re-initiation of PD vs transition back to HD. (He is tentatively written to go on the cycler doing CCPD starting tomorrow - this will hopefully limit number of times in and out of the room while on airborne precautions) 3 Herpes zoster, RLE - airborne contact. IV acyclovir.  4 Gout  5 HTN  6 hx of CVA  7 Hx of PVD LE's  8 Hyperkalemia- improving  Jamal Maes, MD [place] Pager 12/11/2013, 11:07 AM  Addendum: Damaris Schooner with Dr. Joelyn Oms, pt's primary nephrologist Pt was high average transporter May 2015 Is illiterate so PD has been a challenge Did better overall with manual exchanges rather than the cycler due to literacy issues, cycler "scared hiim" His last instructions were for 4 2.5 liter exchanges per 24 hours - manual - CAPD - and he had been doing well Creatinines were running around 15 or so. All this being said, plan will be one more HD today, then pull fem cath, resume PD but as CCPD until pt out of isolation (for his zoster), then back to his outpt CAPD 4X 2.5 liters/day and he can be reassessed on that dialysis dose.   Recent  Labs Lab 12/10/13 0001 12/10/13 0500 12/11/13 0333  NA 134* 134* 136*  K 6.0* 5.2 4.1  CL 101 100 97  CO2 14* 15* 22  GLUCOSE 56* 108* 96  BUN 99* 95* 36*  CREATININE 17.27* 17.31* 9.34*  CALCIUM 8.9 8.8 8.3*  PHOS  --   --  3.6    Recent Labs Lab 12/09/13 1543 12/11/13 0333  AST 16 19  ALT 17 14  ALKPHOS 52 47  BILITOT <0.2* 0.3  PROT 6.6 5.9*  ALBUMIN 3.6 2.9*    Recent Labs Lab 12/09/13 1543 12/10/13 0500 12/11/13 0333  WBC 4.7 3.8* 5.8  NEUTROABS 2.7  --  3.3  HGB 11.1* 10.7* 11.3*  HCT 33.8* 32.7* 35.1*  MCV 92.6 92.9 95.4  PLT 168 151 169    Medications . acyclovir  500 mg Intravenous Q24H  . allopurinol  100 mg Oral Daily  . antiseptic oral rinse  7 mL Mouth Rinse BID  . aspirin EC  325 mg Oral Daily  . atorvastatin  40 mg Oral Daily  . calcitRIOL  0.5 mcg Oral Q M,W,F  . dialysis solution 1.5% low-MG/low-CA   Intraperitoneal Once in dialysis  . heparin  5,000 Units Subcutaneous 3 times per day  . hydrALAZINE  50 mg Oral BID  . insulin aspart  0-9 Units Subcutaneous TID WC  . metoprolol  25 mg Oral BID  . multivitamin  1 tablet Oral Daily  . sevelamer carbonate  2,400 mg Oral TID WC  . sodium chloride  3 mL Intravenous Q12H   . dialysis solution 1.5% low-MG/low-CA     sodium chloride, sodium chloride, acetaminophen, acetaminophen, albuterol, feeding supplement (NEPRO CARB STEADY), heparin, heparin, hydrALAZINE, lidocaine (PF), lidocaine-prilocaine, ondansetron (ZOFRAN) IV, ondansetron, pentafluoroprop-tetrafluoroeth, sevelamer carbonate

## 2013-12-12 DIAGNOSIS — I10 Essential (primary) hypertension: Secondary | ICD-10-CM

## 2013-12-12 DIAGNOSIS — N186 End stage renal disease: Secondary | ICD-10-CM

## 2013-12-12 DIAGNOSIS — E875 Hyperkalemia: Secondary | ICD-10-CM

## 2013-12-12 LAB — RENAL FUNCTION PANEL
ALBUMIN: 2.7 g/dL — AB (ref 3.5–5.2)
ANION GAP: 13 (ref 5–15)
BUN: 26 mg/dL — ABNORMAL HIGH (ref 6–23)
CALCIUM: 8.3 mg/dL — AB (ref 8.4–10.5)
CO2: 25 mEq/L (ref 19–32)
Chloride: 98 mEq/L (ref 96–112)
Creatinine, Ser: 7.76 mg/dL — ABNORMAL HIGH (ref 0.50–1.35)
GFR calc Af Amer: 7 mL/min — ABNORMAL LOW (ref >90)
GFR, EST NON AFRICAN AMERICAN: 6 mL/min — AB (ref >90)
GLUCOSE: 94 mg/dL (ref 70–99)
PHOSPHORUS: 3.6 mg/dL (ref 2.3–4.6)
Potassium: 3.7 mEq/L (ref 3.7–5.3)
Sodium: 136 mEq/L — ABNORMAL LOW (ref 137–147)

## 2013-12-12 LAB — GLUCOSE, CAPILLARY
GLUCOSE-CAPILLARY: 90 mg/dL (ref 70–99)
Glucose-Capillary: 109 mg/dL — ABNORMAL HIGH (ref 70–99)
Glucose-Capillary: 99 mg/dL (ref 70–99)
Glucose-Capillary: 119 mg/dL — ABNORMAL HIGH (ref 70–99)

## 2013-12-12 LAB — CBC
HCT: 32.6 % — ABNORMAL LOW (ref 39.0–52.0)
HEMOGLOBIN: 10.4 g/dL — AB (ref 13.0–17.0)
MCH: 30.8 pg (ref 26.0–34.0)
MCHC: 31.9 g/dL (ref 30.0–36.0)
MCV: 96.4 fL (ref 78.0–100.0)
PLATELETS: 166 10*3/uL (ref 150–400)
RBC: 3.38 MIL/uL — ABNORMAL LOW (ref 4.22–5.81)
RDW: 15.3 % (ref 11.5–15.5)
WBC: 5.9 10*3/uL (ref 4.0–10.5)

## 2013-12-12 NOTE — Progress Notes (Addendum)
[facility] Progress Note   Subjective:  Awake, alert, mentally quite sharp and at his baseline Up in the chair (with fem temp HD cath in...) Need to get that out today Still has active vesicles d/t his shingles Remains in airlock room Had a second HD treatment yesterday  Filed Vitals:   12/11/13 2000 12/11/13 2100 12/11/13 2223 12/12/13 0516  BP: 132/78 149/77 155/82 150/94  Pulse: 106 104 106 99  Temp: 98.4 F (36.9 C)  98.7 F (37.1 C) 99.1 F (37.3 C)  TempSrc: Oral     Resp: 25 21 20 20   Height:      Weight:      SpO2: 98% 97% 97% 97%   Exam: Pt awake, alert Sitting up in the chair Chest clear  RRR 2/6 SEM rusb, no rub Abd soft, NTND. PD cath with clean exit site No LE or UE edema, L forearm AVF is patent  R leg with blistering zoster rash from groin to mid calf mostly anterior/medial  Dialysis catheter in groin with dry dressing (placed 10/11)  CXR, EKG and UA -- normal  Home PD: 4 cycles per day, dwell is 2500 and usually does 2.5% fluid, occ 4.25%   Recent Labs Lab 12/10/13 0500 12/11/13 0333 12/12/13 0605  NA 134* 136* 136*  K 5.2 4.1 3.7  CL 100 97 98  CO2 15* 22 25  GLUCOSE 108* 96 94  BUN 95* 36* 26*  CREATININE 17.31* 9.34* 7.76*  CALCIUM 8.8 8.3* 8.3*  PHOS  --  3.6 3.6    Recent Labs Lab 12/09/13 1543 12/11/13 0333 12/12/13 0605  AST 16 19  --   ALT 17 14  --   ALKPHOS 52 47  --   BILITOT <0.2* 0.3  --   PROT 6.6 5.9*  --   ALBUMIN 3.6 2.9* 2.7*    Recent Labs Lab 12/09/13 1543 12/10/13 0500 12/11/13 0333 12/12/13 0605  WBC 4.7 3.8* 5.8 5.9  NEUTROABS 2.7  --  3.3  --   HGB 11.1* 10.7* 11.3* 10.4*  HCT 33.8* 32.7* 35.1* 32.6*  MCV 92.6 92.9 95.4 96.4  PLT 168 151 169 166   Medications . acyclovir  500 mg Intravenous Q24H  . allopurinol  100 mg Oral Daily  . antiseptic oral rinse  7 mL Mouth Rinse BID  . aspirin EC  325 mg Oral Daily  . atorvastatin  40 mg Oral Daily  . calcitRIOL  0.5 mcg Oral Q  M,W,F  . dialysis solution 1.5% low-MG/low-CA   Intraperitoneal Once in dialysis  . heparin  5,000 Units Subcutaneous 3 times per day  . hydrALAZINE  50 mg Oral BID  . insulin aspart  0-9 Units Subcutaneous TID WC  . metoprolol  25 mg Oral BID  . multivitamin  1 tablet Oral Daily  . sevelamer carbonate  2,400 mg Oral TID WC  . sodium chloride  3 mL Intravenous Q12H   . dialysis solution 1.5% low-MG/low-CA     sodium chloride, sodium chloride, acetaminophen, acetaminophen, albuterol, feeding supplement (NEPRO CARB STEADY), heparin, heparin, hydrALAZINE, lidocaine (PF), lidocaine-prilocaine, ondansetron (ZOFRAN) IV, ondansetron, pentafluoroprop-tetrafluoroeth, sevelamer carbonate   Assessment: 80 yo AAM with ESRD, most recently on CAPD 4 exchanges/day, presented with AMS, herpes zoster of RLE (had been started on non-dose adjusted Valtrex + narcotics) but also with very abnormal labs - creatinine of 17.5 (not really expected in well dialyzed patient) and probable clinical uremia superimposed.  Clinical improvement with renally dose adjusted antivirals,  discontinuation of narcotics, AND with marked improvement in labs and mental status after HD X2 - was initially unclear to me if very abnormal labs were "failure of PD or just failure to do adequate PD" on top of inappropriately dosed antiviral medication, but according to Dr. Joelyn Oms his primary nephrologist he normally runs creatinines in the 15's with adequate PD clearances.  1 Altered mental status / azotemia - marked improvement after HD X 2 (+ renally dose adjusted antivirals)  Have spoken with Dr. Joelyn Oms who has doubts that there was any role of uremia since pt usually runs creatinines around 15 with good clearances.   2 ESRD on PD. S/p HD X 2 via temp cath. Resume PD today. Since still in [place] room on airborne precautions will use cycler for now, once able to go to regular bed on 6700 will  transition to 4X2.5 liter exchanges/day as he does  at home. D/C temp cath.  (Had AVF but was too agitated to use on adm) 3 Herpes zoster, RLE - airborne contact. IV acyclovir.  4 Gout  5 HTN  6 hx of CVA  7 Hx of PVD LE's  8 Hyperkalemia- improving  Jamal Maes, MD [place] Pager 12/12/2013, 11:35 AM

## 2013-12-12 NOTE — Evaluation (Signed)
Physical Therapy Evaluation Patient Details Name: Sean Best MRN: [id number] DOB: [birth year] Today's Date: 12/12/2013   History of Present Illness  is a 80 y.o. male with history of ESRD on peritoneal dialysis since April 2015, was on hemodialysis prior to that, CVA without residual deficits, diet-controlled DM 2, HTN, HLD, prostate cancer, gout, recent shingles, presented to the ED with altered mental status  Clinical Impression  Pt adm due to above. Presents with generalized weakness and balance deficits affecting independence with functional mobility. Pt denied any dizziness this session. Will benefit from skilled acute PT to maximize functional mobility prior to returning home. Will plan to ambulate with cane vs RW next session to assess balance. Daughter planning to bring in cane from home.     Follow Up Recommendations Home health PT;Supervision/Assistance - 24 hour    Equipment Recommendations  None recommended by PT    Recommendations for Other Services       Precautions / Restrictions Precautions Precautions: Fall Precaution Comments: check orthostatics Restrictions Weight Bearing Restrictions: No      Mobility  Bed Mobility               General bed mobility comments: pt up in chair and returned to chair   Transfers Overall transfer level: Needs assistance Equipment used: 1 person hand held assist Transfers: Sit to/from Stand Sit to Stand: Min guard         General transfer comment: pt with min guard to steady with sit to stand; initial sway and HHA provided   Ambulation/Gait Ambulation/Gait assistance: Min guard Ambulation Distance (Feet): 30 Feet Assistive device: 1 person hand held assist;None (support through gt belt) Gait Pattern/deviations: Decreased stance time - right;Decreased step length - left;Antalgic;Decreased weight shift to right;Narrow base of support Gait velocity: decreased Gait velocity interpretation: Below normal speed for  age/gender General Gait Details: pt with multiple staggered steps with directional changes; difficulty with high level balance activities; handheld (A) at times for balance; recommend use of cane vs RW for safety  Stairs            Wheelchair Mobility    Modified Rankin (Stroke Patients Only)       Balance Overall balance assessment: Needs assistance         Standing balance support: During functional activity;No upper extremity supported Standing balance-Leahy Scale: Fair Standing balance comment: slight sway with initial sit to stand             High level balance activites: Head turns;Direction changes;Turns High Level Balance Comments: handheld (A) PRN for staggered steps              Pertinent Vitals/Pain Pain Assessment: No/denies pain    Home Living Family/patient expects to be discharged to:: Private residence Living Arrangements: Children Available Help at Discharge: Available PRN/intermittently;Family Type of Home: House Home Access: Ramped entrance     Home Layout: One level Home Equipment: [place] - single point;Walker - 2 wheels      Prior Function Level of Independence: Independent         Comments: daughter comes by in the evenings to check on pt.     Hand Dominance        Extremity/Trunk Assessment   Upper Extremity Assessment: Defer to OT evaluation           Lower Extremity Assessment: Generalized weakness      Cervical / Trunk Assessment: Normal  Communication      Cognition Arousal/Alertness: Awake/alert Behavior During Therapy:  WFL for tasks assessed/performed Overall Cognitive Status: Within Functional Limits for tasks assessed                      General Comments General comments (skin integrity, edema, etc.): encouraged OOB for all meals and mobility within room with supervision     Exercises        Assessment/Plan    PT Assessment Patient needs continued PT services  PT Diagnosis  Generalized weakness;Abnormality of gait   PT Problem List Decreased strength;Decreased activity tolerance;Decreased balance;Decreased mobility;Decreased knowledge of use of DME  PT Treatment Interventions DME instruction;Gait training;Functional mobility training;Therapeutic activities;Therapeutic exercise;Balance training;Neuromuscular re-education;Patient/family education   PT Goals (Current goals can be found in the Care Plan section) Acute Rehab PT Goals Patient Stated Goal: to walk with cane only PT Goal Formulation: With patient Time For Goal Achievement: 12/16/13 Potential to Achieve Goals: Good    Frequency Min 3X/week   Barriers to discharge Decreased caregiver support needs to be MOD I to supervision for D/C     Co-evaluation               End of Session Equipment Utilized During Treatment: Gait belt Activity Tolerance: Patient tolerated treatment well Patient left: in chair;with call bell/phone within reach;with family/visitor present Nurse Communication: Mobility status;Precautions         Time: [id number] PT Time Calculation (min): 15 min   Charges:   PT Evaluation $Initial PT Evaluation Tier I: 1 Procedure PT Treatments $Gait Training: 8-22 mins   PT G CodesGustavus Bryant, [place]  [phone] 12/12/2013, 10:39 AM

## 2013-12-12 NOTE — Progress Notes (Signed)
Left Femoral catheter removed per order. Catheter=intact. Manual pressure held with no active bleeding noted. Vaseline gauze pressure dressing applied and secured. Pt. Instructed to remain in bed for at least 30 minutes. Pt. Given call bell and instructed to call Joycelyn Schmid, RN if he feels any wetness(bleeding). Joycelyn Schmid, RN also made aware of cath removal.

## 2013-12-12 NOTE — Progress Notes (Signed)
Patient ID: Sean Best  male  [id number]    DOB: 11-03-1933    DOA: 12/09/2013  PCP: [name], MD  Admit HPI / Brief Narrative: 80 y.o.M Hx ESRD on peritoneal dialysis since April 2015 (on hemodialysis prior to that), CVA without residual deficits, diet-controlled DM 2, HTN, HLD, prostate cancer, gout, and current shingles who presented to the ED with altered mental status. Patient lives alone and is independent in activities of daily living. He does manual peritoneal dialysis-4 cycles per day. The daughter was alerted that patient was confused, walking from room to room, talking to his demised wife. No reported fever, chills, nausea, vomiting, pain, dyspnea. He was recently started on Valtrex and opioids for right lower extremity shingles, 3 days prior.  In the ED he became agitated. He was given a milligram of Ativan to obtain a head CT. Potassium 6.3, glucose 56, BUN 96, creatinine 17.3, hemoglobin 11.1 and chest x-ray noted cardiomegaly without edema or consolidation.  Since his admission, the pt has undergone placement of a HD cath, and initiation of HD. His acyclovir dose has been adjusted. With these measures, he has improved markedly in regard to his mental status.   Assessment/Plan: Principal Problem:   Encephalopathy acute likely due to uremia, opioids, acyclovir and hypoglycemia-  - resolved, currently at baseline mental status, confirmed by the daughter at the bedside - Continue hemodialysis per nephrology - No clinical focus of infection - CT head negative  Active Problems: ESRD on peritoneal dialysis presenting with hyperkalemia/plan Metabolic acidosis, uremia - Patient underwent emergent hemodialysis on 10/11 - Renal service following  Right lower extremity Shingles - Continue airborne precautions, acyclovir    DM (diabetes mellitus), type 2 with renal complications, diet controlled - CBG's stable    Benign hypertension stable   Anemia likely anemia due to  chronic disease - Currently stable  DVT Prophylaxis:Heparin subcutaneous  Code Status:  Family Communication:Discussed in detail with patient's daughter at the bedside  Disposition:  Consultants:  Nephrology  Procedures:  Hemodialysis  Antibiotics:  Acyclovir 10/11    Subjective: Patient seen and examined, feeling a whole lot better today, mental status at baseline, alert and oriented x4, confirmed by the daughter at the bedside. No other complaints  Objective: Weight change: 0.6 kg (1 lb 5.2 oz)  Intake/Output Summary (Last 24 hours) at 12/12/13 1214 Last data filed at 12/12/13 0800  Gross per 24 hour  Intake    590 ml  Output    150 ml  Net    440 ml   Blood pressure 150/94, pulse 99, temperature 99.1 F (37.3 C), temperature source Oral, resp. rate 20, height 6' (1.829 m), weight 93.3 kg (205 lb 11 oz), SpO2 97.00%.  Physical Exam: General: Alert and awake, oriented x3, not in any acute distress. CVS: S1-S2 clear, no murmur rubs or gallops Chest: clear to auscultation bilaterally, no wheezing, rales or rhonchi Abdomen: soft nontender, nondistended, normal bowel sounds  Extremities: no cyanosis, clubbing or edema noted bilaterally, Right thigh rash in the L2-L3 dermatomal fashion  Neuro: Cranial nerves II-XII intact, no focal neurological deficits  Lab Results: Basic Metabolic Panel:  Recent Labs Lab 12/11/13 0333 12/12/13 0605  NA 136* 136*  K 4.1 3.7  CL 97 98  CO2 22 25  GLUCOSE 96 94  BUN 36* 26*  CREATININE 9.34* 7.76*  CALCIUM 8.3* 8.3*  MG 1.8  --   PHOS 3.6 3.6   Liver Function Tests:  Recent Labs Lab 12/09/13 1543  12/11/13 0333 12/12/13 0605  AST 16 19  --   ALT 17 14  --   ALKPHOS 52 47  --   BILITOT <0.2* 0.3  --   PROT 6.6 5.9*  --   ALBUMIN 3.6 2.9* 2.7*   No results found for this basename: LIPASE, AMYLASE,  in the last 168 hours  Recent Labs Lab 12/09/13 1730  AMMONIA 50   CBC:  Recent Labs Lab 12/11/13 0333  12/12/13 0605  WBC 5.8 5.9  NEUTROABS 3.3  --   HGB 11.3* 10.4*  HCT 35.1* 32.6*  MCV 95.4 96.4  PLT 169 166   Cardiac Enzymes: No results found for this basename: CKTOTAL, CKMB, CKMBINDEX, TROPONINI,  in the last 168 hours BNP: No components found with this basename: POCBNP,  CBG:  Recent Labs Lab 12/11/13 1147 12/11/13 1629 12/11/13 2213 12/12/13 0635 12/12/13 1124  GLUCAP 109* 84 150* 90 109*     Micro Results: Recent Results (from the past 240 hour(s))  MRSA PCR SCREENING     Status: None   Collection Time    12/10/13  4:25 AM      Result Value Ref Range Status   MRSA by PCR NEGATIVE  NEGATIVE Final   Comment:            The GeneXpert MRSA Assay (FDA     approved for NASAL specimens     only), is one component of a     comprehensive MRSA colonization     surveillance program. It is not     intended to diagnose MRSA     infection nor to guide or     monitor treatment for     MRSA infections.  CULTURE, BLOOD (ROUTINE X 2)     Status: None   Collection Time    12/10/13  8:40 PM      Result Value Ref Range Status   Specimen Description BLOOD RIGHT ANTECUBITAL   Final   Special Requests BOTTLES DRAWN AEROBIC AND ANAEROBIC Micro EA   Final   Culture  Setup Time     Final   Value: 12/11/2013 02:37     Performed at [facility]   Culture     Final   Value:        BLOOD CULTURE RECEIVED NO GROWTH TO DATE CULTURE WILL BE HELD FOR 5 DAYS BEFORE ISSUING A FINAL NEGATIVE REPORT     Performed at [facility]   Report Status PENDING   Incomplete  CULTURE, BLOOD (ROUTINE X 2)     Status: None   Collection Time    12/10/13  8:45 PM      Result Value Ref Range Status   Specimen Description BLOOD RIGHT FOREARM   Final   Special Requests BOTTLES DRAWN AEROBIC AND ANAEROBIC [place] EA   Final   Culture  Setup Time     Final   Value: 12/11/2013 02:37     Performed at [facility]   Culture     Final   Value:        BLOOD CULTURE RECEIVED NO GROWTH TO  DATE CULTURE WILL BE HELD FOR 5 DAYS BEFORE ISSUING A FINAL NEGATIVE REPORT     Performed at [facility]   Report Status PENDING   Incomplete    Studies/Results: Dg Chest 2 View  12/09/2013   CLINICAL DATA:  Renal failure ; herpes zoster infection  EXAM: CHEST  2 VIEW  COMPARISON:  March 11, 2013  FINDINGS: Lungs are clear. Heart is enlarged, stable. Pulmonary vascularity is normal. No adenopathy. No bone lesions.  IMPRESSION: Cardiomegaly.  No edema or consolidation.   Electronically Signed   By: Lowella Grip M.D.   On: 12/09/2013 15:33   Ct Head Wo Contrast  12/09/2013   CLINICAL DATA:  Confusion. Patient combative. Initial evaluation could  EXAM: CT HEAD WITHOUT CONTRAST  TECHNIQUE: Contiguous axial images were obtained from the base of the skull through the vertex without intravenous contrast.  COMPARISON:  03/11/2013 .  FINDINGS: Motion artifact is present.No intra-axial or extra-axial pathologic fluid or blood collection. No mass. No hydrocephalus. No acute bony abnormality.  IMPRESSION: Limited exam due to motion artifact. No acute abnormality identified.   Electronically Signed   By: Marcello Moores  Register   On: 12/09/2013 18:57    Medications: Scheduled Meds: . acyclovir  500 mg Intravenous Q24H  . allopurinol  100 mg Oral Daily  . antiseptic oral rinse  7 mL Mouth Rinse BID  . aspirin EC  325 mg Oral Daily  . atorvastatin  40 mg Oral Daily  . calcitRIOL  0.5 mcg Oral Q M,W,F  . dialysis solution 1.5% low-MG/low-CA   Intraperitoneal Once in dialysis  . heparin  5,000 Units Subcutaneous 3 times per day  . hydrALAZINE  50 mg Oral BID  . insulin aspart  0-9 Units Subcutaneous TID WC  . metoprolol  25 mg Oral BID  . multivitamin  1 tablet Oral Daily  . sevelamer carbonate  2,400 mg Oral TID WC  . sodium chloride  3 mL Intravenous Q12H      LOS: 3 days   Sean Best M.D. [facility] 12/12/2013, 12:14 PM Pager: [phone]  If 7PM-7AM, please contact  night-coverage [email or web address] Password [email or web address]

## 2013-12-13 DIAGNOSIS — N19 Unspecified kidney failure: Secondary | ICD-10-CM

## 2013-12-13 LAB — RENAL FUNCTION PANEL
Albumin: 2.6 g/dL — ABNORMAL LOW (ref 3.5–5.2)
Anion gap: 15 (ref 5–15)
BUN: 29 mg/dL — AB (ref 6–23)
CALCIUM: 8.6 mg/dL (ref 8.4–10.5)
CO2: 24 meq/L (ref 19–32)
Chloride: 98 mEq/L (ref 96–112)
Creatinine, Ser: 8.36 mg/dL — ABNORMAL HIGH (ref 0.50–1.35)
GFR calc Af Amer: 6 mL/min — ABNORMAL LOW (ref >90)
GFR calc non Af Amer: 5 mL/min — ABNORMAL LOW (ref >90)
GLUCOSE: 130 mg/dL — AB (ref 70–99)
PHOSPHORUS: 3.4 mg/dL (ref 2.3–4.6)
Potassium: 3.5 mEq/L — ABNORMAL LOW (ref 3.7–5.3)
SODIUM: 137 meq/L (ref 137–147)

## 2013-12-13 LAB — GLUCOSE, CAPILLARY
Glucose-Capillary: 140 mg/dL — ABNORMAL HIGH (ref 70–99)
Glucose-Capillary: 110 mg/dL — ABNORMAL HIGH (ref 70–99)
Glucose-Capillary: 111 mg/dL — ABNORMAL HIGH (ref 70–99)
Glucose-Capillary: 109 mg/dL — ABNORMAL HIGH (ref 70–99)

## 2013-12-13 NOTE — Progress Notes (Signed)
[facility] Progress Note   Subjective:  Awake, alert, mentally quite sharp and at his baseline- wanting to be disconnected so he can go to the bathroom Fem cath out Still has active vesicles d/t his shingles Remains in airlock room Back on CCPD for now- UF 880 last 24 hours  Filed Vitals:   12/12/13 0516 12/12/13 1327 12/12/13 1958 12/13/13 0447  BP: 150/94 158/89 181/82 148/87  Pulse: 99 105 100 96  Temp: 99.1 F (37.3 C) 98.7 F (37.1 C) 98 F (36.7 C) 98.5 F (36.9 C)  TempSrc:  Oral    Resp: 20 20 18 18   Height:      Weight:    93.5 kg (206 lb 2.1 oz)  SpO2: 97% 99% 96% 96%   Exam: Pt awake, alert Sitting up in the chair Chest clear  RRR 2/6 SEM rusb, no rub Abd soft, NTND. PD cath with clean exit site No LE or UE edema, L forearm AVF is patent  R leg with blistering zoster rash from groin to mid calf mostly anterior/medial    CXR, EKG and UA -- normal  Home PD: 4 cycles per day, dwell is 2500 and usually does 2.5% fluid, occ 4.25%   Recent Labs Lab 12/11/13 0333 12/12/13 0605 12/13/13 0524  NA 136* 136* 137  K 4.1 3.7 3.5*  CL 97 98 98  CO2 22 25 24   GLUCOSE 96 94 130*  BUN 36* 26* 29*  CREATININE 9.34* 7.76* 8.36*  CALCIUM 8.3* 8.3* 8.6  PHOS 3.6 3.6 3.4    Recent Labs Lab 12/09/13 1543 12/11/13 0333 12/12/13 0605 12/13/13 0524  AST 16 19  --   --   ALT 17 14  --   --   ALKPHOS 52 47  --   --   BILITOT <0.2* 0.3  --   --   PROT 6.6 5.9*  --   --   ALBUMIN 3.6 2.9* 2.7* 2.6*    Recent Labs Lab 12/09/13 1543 12/10/13 0500 12/11/13 0333 12/12/13 0605  WBC 4.7 3.8* 5.8 5.9  NEUTROABS 2.7  --  3.3  --   HGB 11.1* 10.7* 11.3* 10.4*  HCT 33.8* 32.7* 35.1* 32.6*  MCV 92.6 92.9 95.4 96.4  PLT 168 151 169 166   Medications . acyclovir  500 mg Intravenous Q24H  . allopurinol  100 mg Oral Daily  . antiseptic oral rinse  7 mL Mouth Rinse BID  . aspirin EC  325 mg Oral Daily  . atorvastatin  40 mg Oral Daily  .  calcitRIOL  0.5 mcg Oral Q M,W,F  . dialysis solution 1.5% low-MG/low-CA   Intraperitoneal Once in dialysis  . heparin  5,000 Units Subcutaneous 3 times per day  . hydrALAZINE  50 mg Oral BID  . insulin aspart  0-9 Units Subcutaneous TID WC  . metoprolol  25 mg Oral BID  . multivitamin  1 tablet Oral Daily  . sevelamer carbonate  2,400 mg Oral TID WC  . sodium chloride  3 mL Intravenous Q12H   . dialysis solution 1.5% low-MG/low-CA     sodium chloride, sodium chloride, acetaminophen, acetaminophen, albuterol, feeding supplement (NEPRO CARB STEADY), heparin, heparin, hydrALAZINE, lidocaine (PF), lidocaine-prilocaine, ondansetron (ZOFRAN) IV, ondansetron, pentafluoroprop-tetrafluoroeth, sevelamer carbonate   Assessment: 80 yo AAM with ESRD, most recently on CAPD 4 exchanges/day, presented with AMS, herpes zoster of RLE (had been started on non-dose adjusted Valtrex + narcotics) but also with very abnormal labs - creatinine of 17.5 (not really  expected in well dialyzed patient) and probable clinical uremia superimposed.  Clinical improvement with renally dose adjusted antivirals, discontinuation of narcotics, AND with marked improvement in labs and mental status after HD X2 - was initially unclear to me if very abnormal labs were "failure of PD or just failure to do adequate PD" on top of inappropriately dosed antiviral medication, but according to Dr. Joelyn Oms his primary nephrologist he normally runs creatinines in the 15's with adequate PD clearances.  1 Altered mental status / azotemia - marked improvement after HD X 2 (+ renally dose adjusted antivirals)  Have spoken with Dr. Joelyn Oms who has doubts that there was any role of uremia since pt usually runs creatinines around 15 with good clearances.  Now back on CCPD and holding his own 2 ESRD on PD. S/p HD X 2 via temp cath. Have resumed PD. Since still in [place] room on airborne precautions will use cycler for now, once able to go to regular bed  on 6700 will  transition to 4X2.5 liter exchanges/day as he does at home. Fem cath has been d/cd 3 Herpes zoster, RLE - airborne contact. IV acyclovir.  4 Gout  5 HTN - seems OK for now on hydralazine- UF with PD 6 hx of CVA  7 Hx of PVD LE's  8 Hyperkalemia- resolved 9. Anemia- hgb stable in the 10's-11's- no aranesp 10. Bones- cal/phos WNL- on calcitriol/renvela    Ivery Michalski A   12/13/2013, 10:33 AM

## 2013-12-13 NOTE — Progress Notes (Signed)
[facility] PROGRESS NOTE  Sean Best [id number] DOB: 08-10-1933 DOA: 12/09/2013 PCP: [name], MD  Assessment/Plan: 80 y.o.M Hx ESRD on peritoneal dialysis since April 2015 (on hemodialysis prior to that), CVA without residual deficits, diet-controlled DM 2, HTN, HLD, prostate cancer, gout, and current shingles who presented to the ED with altered mental status. Patient lives alone and is independent in activities of daily living. He does manual peritoneal dialysis-4 cycles per day. The daughter was alerted that patient was confused, walking from room to room, talking to his demised wife. No reported fever, chills, nausea, vomiting, pain, dyspnea. He was recently started on Valtrex and opioids for right lower extremity shingles, 3 days prior.   1. Encephalopathy acute likely due to uremia, opioids, hypoglycemia - CT head: no acute abnormalities; neuro exam no focal;  - encephalopathy is resolved, currently at baseline mental status - Patient had HD x2; resumed peritoneal cath per nephrology   2. ESRD on peritoneal dialysis presenting with hyperkalemia/plan Metabolic acidosis, uremia  - Patient underwent emergent hemodialysis x2;   resumed peritoneal cath per nephrology  3. Right lower extremity Shingles  - still have open lesions; Continue airborne precautions, acyclovir  4. DM (diabetes mellitus), type 2 with renal complications, diet controlled  - CBG's stable  5. Benign hypertension stable  6. Anemia likely anemia due to chronic disease  - Currently stable  7. HTN, cont HD' titrate oral meds as tolerated      Code Status: full Family Communication:  D/w patient (indicate person spoken with, relationship, and if by phone, the number) Disposition Plan: home pend clinical improvement    Consultants:  Nephrology   Procedures:  HD  Antibiotics: Acyclovir 10/11   (indicate start date, and stop date if known)  HPI/Subjective: alert  Objective: Filed  Vitals:   12/13/13 0447  BP: 148/87  Pulse: 96  Temp: 98.5 F (36.9 C)  Resp: 18    Intake/Output Summary (Last 24 hours) at 12/13/13 0937 Last data filed at 12/12/13 2200  Gross per 24 hour  Intake    360 ml  Output    100 ml  Net    260 ml   Filed Weights   12/11/13 1435 12/11/13 1815 12/13/13 0447  Weight: 93.3 kg (205 lb 11 oz) 93.3 kg (205 lb 11 oz) 93.5 kg (206 lb 2.1 oz)    Exam:   General:  alert  Cardiovascular: s1,s2 rrr  Respiratory: CTA BL  Abdomen: soft, nt,nd   Musculoskeletal: no LE edema; R leg vesicular lesions    Data Reviewed: Basic Metabolic Panel:  Recent Labs Lab 12/10/13 0001 12/10/13 0500 12/11/13 0333 12/12/13 0605 12/13/13 0524  NA 134* 134* 136* 136* 137  K 6.0* 5.2 4.1 3.7 3.5*  CL 101 100 97 98 98  CO2 14* 15* 22 25 24   GLUCOSE 56* 108* 96 94 130*  BUN 99* 95* 36* 26* 29*  CREATININE 17.27* 17.31* 9.34* 7.76* 8.36*  CALCIUM 8.9 8.8 8.3* 8.3* 8.6  MG  --   --  1.8  --   --   PHOS  --   --  3.6 3.6 3.4   Liver Function Tests:  Recent Labs Lab 12/09/13 1543 12/11/13 0333 12/12/13 0605 12/13/13 0524  AST 16 19  --   --   ALT 17 14  --   --   ALKPHOS 52 47  --   --   BILITOT <0.2* 0.3  --   --   PROT 6.6 5.9*  --   --  ALBUMIN 3.6 2.9* 2.7* 2.6*   No results found for this basename: LIPASE, AMYLASE,  in the last 168 hours  Recent Labs Lab 12/09/13 1730  AMMONIA 50   CBC:  Recent Labs Lab 12/09/13 1543 12/10/13 0500 12/11/13 0333 12/12/13 0605  WBC 4.7 3.8* 5.8 5.9  NEUTROABS 2.7  --  3.3  --   HGB 11.1* 10.7* 11.3* 10.4*  HCT 33.8* 32.7* 35.1* 32.6*  MCV 92.6 92.9 95.4 96.4  PLT 168 151 169 166   Cardiac Enzymes: No results found for this basename: CKTOTAL, CKMB, CKMBINDEX, TROPONINI,  in the last 168 hours BNP (last 3 results) No results found for this basename: PROBNP,  in the last 8760 hours CBG:  Recent Labs Lab 12/12/13 0635 12/12/13 1124 12/12/13 1645 12/12/13 2216 12/13/13 0636   GLUCAP 90 109* 99 119* 140*    Recent Results (from the past 240 hour(s))  MRSA PCR SCREENING     Status: None   Collection Time    12/10/13  4:25 AM      Result Value Ref Range Status   MRSA by PCR NEGATIVE  NEGATIVE Final   Comment:            The GeneXpert MRSA Assay (FDA     approved for NASAL specimens     only), is one component of a     comprehensive MRSA colonization     surveillance program. It is not     intended to diagnose MRSA     infection nor to guide or     monitor treatment for     MRSA infections.  CULTURE, BLOOD (ROUTINE X 2)     Status: None   Collection Time    12/10/13  8:40 PM      Result Value Ref Range Status   Specimen Description BLOOD RIGHT ANTECUBITAL   Final   Special Requests BOTTLES DRAWN AEROBIC AND ANAEROBIC [place] EA   Final   Culture  Setup Time     Final   Value: 12/11/2013 02:37     Performed at [facility]   Culture     Final   Value:        BLOOD CULTURE RECEIVED NO GROWTH TO DATE CULTURE WILL BE HELD FOR 5 DAYS BEFORE ISSUING A FINAL NEGATIVE REPORT     Performed at [facility]   Report Status PENDING   Incomplete  CULTURE, BLOOD (ROUTINE X 2)     Status: None   Collection Time    12/10/13  8:45 PM      Result Value Ref Range Status   Specimen Description BLOOD RIGHT FOREARM   Final   Special Requests BOTTLES DRAWN AEROBIC AND ANAEROBIC [place] EA   Final   Culture  Setup Time     Final   Value: 12/11/2013 02:37     Performed at [facility]   Culture     Final   Value:        BLOOD CULTURE RECEIVED NO GROWTH TO DATE CULTURE WILL BE HELD FOR 5 DAYS BEFORE ISSUING A FINAL NEGATIVE REPORT     Performed at [facility]   Report Status PENDING   Incomplete     Studies: No results found.  Scheduled Meds: . acyclovir  500 mg Intravenous Q24H  . allopurinol  100 mg Oral Daily  . antiseptic oral rinse  7 mL Mouth Rinse BID  . aspirin EC  325 mg Oral Daily  .  atorvastatin  40 mg Oral Daily  .  calcitRIOL  0.5 mcg Oral Q M,W,F  . dialysis solution 1.5% low-MG/low-CA   Intraperitoneal Once in dialysis  . heparin  5,000 Units Subcutaneous 3 times per day  . hydrALAZINE  50 mg Oral BID  . insulin aspart  0-9 Units Subcutaneous TID WC  . metoprolol  25 mg Oral BID  . multivitamin  1 tablet Oral Daily  . sevelamer carbonate  2,400 mg Oral TID WC  . sodium chloride  3 mL Intravenous Q12H   Continuous Infusions: . dialysis solution 1.5% low-MG/low-CA      Principal Problem:   Encephalopathy acute Active Problems:   Hyperkalemia   Anemia   DM (diabetes mellitus), type 2 with renal complications, diet controlled   Benign hypertension   Hyperlipidemia   ESRD on peritoneal dialysis   Uremia   Thigh shingles   Acute encephalopathy    Time spent: >35 minutes     Kinnie Feil  [facility] Pager [phone]. If 7PM-7AM, please contact night-coverage at [email or web address], password [facility] 12/13/2013, 9:37 AM  LOS: 4 days

## 2013-12-14 DIAGNOSIS — B029 Zoster without complications: Secondary | ICD-10-CM

## 2013-12-14 DIAGNOSIS — E1122 Type 2 diabetes mellitus with diabetic chronic kidney disease: Secondary | ICD-10-CM

## 2013-12-14 DIAGNOSIS — N189 Chronic kidney disease, unspecified: Secondary | ICD-10-CM

## 2013-12-14 LAB — GLUCOSE, CAPILLARY
GLUCOSE-CAPILLARY: 144 mg/dL — AB (ref 70–99)
Glucose-Capillary: 120 mg/dL — ABNORMAL HIGH (ref 70–99)

## 2013-12-14 MED ORDER — HYDRALAZINE HCL 50 MG PO TABS
50.0000 mg | ORAL_TABLET | Freq: Once | ORAL | Status: AC
  Administered 2013-12-14: 50 mg via ORAL
  Filled 2013-12-14: qty 1

## 2013-12-14 MED ORDER — ACETAMINOPHEN 325 MG PO TABS: 650.0000 mg | ORAL_TABLET | Freq: Four times a day (QID) | ORAL | Status: DC | PRN

## 2013-12-14 MED ORDER — VALACYCLOVIR HCL 1 G PO TABS: 500.0000 mg | ORAL_TABLET | Freq: Every day | ORAL | Status: DC

## 2013-12-14 NOTE — Discharge Instructions (Signed)
Please follow up with primary care doctor in 1 week  °

## 2013-12-14 NOTE — Progress Notes (Signed)
Sean Best Progress Note   Subjective:  Awake, alert, some technical issues with cycler last night Still has active vesicles d/t his shingles Remains in airlock room Back on CCPD for now- UF 1100 last 24 hours- BP pretty high- not on his home dose of meds but plan is for him to be discharged today   Filed Vitals:   12/13/13 0447 12/13/13 1300 12/13/13 2020 12/14/13 0622  BP: 148/87 157/95 172/96 180/89  Pulse: 96 102 92 98  Temp: 98.5 F (36.9 C) 98.6 F (37 C) 98 F (36.7 C) 98.3 F (36.8 C)  TempSrc:      Resp: 18 20 18 18   Height:      Weight: 93.5 kg (206 lb 2.1 oz)   94.4 kg (208 lb 1.8 oz)  SpO2: 96%  97% 99%   Exam: Pt awake, alert Sitting up in the chair Chest clear  RRR 2/6 SEM rusb, no rub Abd soft, NTND. PD cath with clean exit site No LE or UE edema, L forearm AVF is patent  R leg with blistering zoster rash from groin to mid calf mostly anterior/medial    CXR, EKG and UA -- normal  Home PD: 4 cycles per day, dwell is 2500 and usually does 2.5% fluid, occ 4.25%   Recent Labs Lab 12/11/13 0333 12/12/13 0605 12/13/13 0524  NA 136* 136* 137  K 4.1 3.7 3.5*  CL 97 98 98  CO2 22 25 24   GLUCOSE 96 94 130*  BUN 36* 26* 29*  CREATININE 9.34* 7.76* 8.36*  CALCIUM 8.3* 8.3* 8.6  PHOS 3.6 3.6 3.4    Recent Labs Lab 12/09/13 1543 12/11/13 0333 12/12/13 0605 12/13/13 0524  AST 16 19  --   --   ALT 17 14  --   --   ALKPHOS 52 47  --   --   BILITOT <0.2* 0.3  --   --   PROT 6.6 5.9*  --   --   ALBUMIN 3.6 2.9* 2.7* 2.6*    Recent Labs Lab 12/09/13 1543 12/10/13 0500 12/11/13 0333 12/12/13 0605  WBC 4.7 3.8* 5.8 5.9  NEUTROABS 2.7  --  3.3  --   HGB 11.1* 10.7* 11.3* 10.4*  HCT 33.8* 32.7* 35.1* 32.6*  MCV 92.6 92.9 95.4 96.4  PLT 168 151 169 166   Medications . acyclovir  500 mg Intravenous Q24H  . allopurinol  100 mg Oral Daily  . antiseptic oral rinse  7 mL Mouth Rinse BID  . aspirin EC  325 mg Oral Daily  .  atorvastatin  40 mg Oral Daily  . calcitRIOL  0.5 mcg Oral Q M,W,F  . dialysis solution 1.5% low-MG/low-CA   Intraperitoneal Once in dialysis  . heparin  5,000 Units Subcutaneous 3 times per day  . hydrALAZINE  50 mg Oral BID  . insulin aspart  0-9 Units Subcutaneous TID WC  . metoprolol  25 mg Oral BID  . multivitamin  1 tablet Oral Daily  . sevelamer carbonate  2,400 mg Oral TID WC  . sodium chloride  3 mL Intravenous Q12H   . dialysis solution 1.5% low-MG/low-CA     sodium chloride, sodium chloride, acetaminophen, acetaminophen, albuterol, feeding supplement (NEPRO CARB STEADY), heparin, heparin, hydrALAZINE, lidocaine (PF), lidocaine-prilocaine, ondansetron (ZOFRAN) IV, ondansetron, pentafluoroprop-tetrafluoroeth, sevelamer carbonate   Assessment: 80 yo AAM with ESRD, most recently on CAPD 4 exchanges/day, presented with AMS, herpes zoster of RLE (had been started on non-dose adjusted Valtrex + narcotics) but  also with very abnormal labs - creatinine of 17.5 (not really expected in well dialyzed patient) and probable clinical uremia superimposed.  Clinical improvement with renally dose adjusted antivirals, discontinuation of narcotics, AND with marked improvement in labs and mental status after HD X2 - was initially unclear to me if very abnormal labs were "failure of PD or just failure to do adequate PD" on top of inappropriately dosed antiviral medication, but according to Dr. Joelyn Oms his primary nephrologist he normally runs creatinines in the 15's with adequate PD clearances.  1 Altered mental status / azotemia - marked improvement after HD X 2 (+ renally dose adjusted antivirals)  Have spoken with Dr. Joelyn Oms who has doubts that there was any role of uremia since pt usually runs creatinines around 15 with good clearances.  Now back on CCPD and holding his own- I am OK with discharge to home to resume his previous treatment regimen 2 ESRD on PD. S/p HD X 2 via temp cath. Have resumed PD.  Since still in [place] room on airborne precautions will use cycler for now, once able to go to regular bed on 6700 will  transition to 4X2.5 liter exchanges/day as he does at home. Fem cath has been d/cd 3 Herpes zoster, RLE - airborne contact. IV acyclovir.  4 Gout  5 HTN - not well controlled- is not on his home dose of hydralazine - to resume at discharge- he may also need 2.5% fluid to discuss with his PCP 6 hx of CVA  7 Hx of PVD LE's  8 Hyperkalemia- resolved 9. Anemia- hgb stable in the 10's-11's- no aranesp 10. Bones- cal/phos WNL- on calcitriol/renvela    Sean Best   12/14/2013, 10:29 AM

## 2013-12-14 NOTE — Progress Notes (Signed)
Patient placed on Cycler machine again per MD's order. Patient tolerated procedure, no complication occurred.

## 2013-12-14 NOTE — Progress Notes (Signed)
Discharge instructions were given to patient, stated that he understanded the instructions. His friend got here and take pt to home.

## 2013-12-14 NOTE — Progress Notes (Signed)
Physical Therapy Treatment Patient Details Name: Sean Best MRN: [id number] DOB: [birth year] Today's Date: 12/14/2013    History of Present Illness is a 80 y.o. male with history of ESRD on peritoneal dialysis since April 2015, was on hemodialysis prior to that, CVA without residual deficits, diet-controlled DM 2, HTN, HLD, prostate cancer, gout, recent shingles, presented to the ED with altered mental status    PT Comments    Pt mobilizing well with SPC within room. No LOB noted with mobility. Pt planned to D/C home today with family. Will recommend HHPT for strengthening and mobility.   Follow Up Recommendations  Home health PT;Supervision/Assistance - 24 hour     Equipment Recommendations  None recommended by PT    Recommendations for Other Services       Precautions / Restrictions Precautions Precautions: Fall Restrictions Weight Bearing Restrictions: No    Mobility  Bed Mobility               General bed mobility comments: pt sitting EOB; denied any difficulties with bed mobility   Transfers Overall transfer level: Modified independent Equipment used: Straight cane Transfers: Sit to/from Stand Sit to Stand: Modified independent (Device/Increase time)         General transfer comment: mod i with transfers; no LOB with transfers; demo good balance with cane   Ambulation/Gait Ambulation/Gait assistance: Modified independent (Device/Increase time) Ambulation Distance (Feet): 50 Feet Assistive device: Straight cane Gait Pattern/deviations: Step-through pattern;Shuffle Gait velocity: decreased Gait velocity interpretation: Below normal speed for age/gender General Gait Details: pt demo good safety and stability ambulating with cane within room; no LOB with directional chagnes   Stairs            Wheelchair Mobility    Modified Rankin (Stroke Patients Only)       Balance Overall balance assessment: No apparent balance deficits (not formally  assessed)                           High level balance activites: Direction changes;Head turns High Level Balance Comments: demo good stability and no LOB with directional changes with use of cane     Cognition Arousal/Alertness: Awake/alert Behavior During Therapy: WFL for tasks assessed/performed Overall Cognitive Status: Within Functional Limits for tasks assessed                      Exercises      General Comments        Pertinent Vitals/Pain Pain Assessment: No/denies pain    Home Living                      Prior Function            PT Goals (current goals can now be found in the care plan section) Acute Rehab PT Goals Patient Stated Goal: to walk with cane only PT Goal Formulation: With patient Time For Goal Achievement: 12/16/13 Potential to Achieve Goals: Good Progress towards PT goals: Goals met/education completed, patient discharged from PT    Frequency  Min 3X/week    PT Plan Current plan remains appropriate    Co-evaluation             End of Session   Activity Tolerance: Patient tolerated treatment well Patient left: in bed;with call bell/phone within reach     Time: 1127-1140 PT Time Calculation (min): 13 min  Charges:  $Gait Training: 8-22 mins  G CodesGustavus Best, [place] 12/14/2013, 12:45 PM

## 2013-12-14 NOTE — Progress Notes (Signed)
12/14/2013 4:00 AM  Received a phone call from [place] that the PD machine was alarming "Air Leak". Went to assess the machine and it was alarming a RED ALERT and stating that I disconnect the patient and end the treatment. After trying to troubleshoot the machine there was no way to clear the alarm without doing the request the machine stated. So in the sterile field I created I disconnected the patient and turned the machine off. The patient stated that he did not know where his treatment stopped. I then notified on call [facility]. MD Justin Mend returned page and instructed me to do a modified order and get two more exchanges in tonight with another machine. Will go back and set up a new cycler session.  [employer], RN-BC [place] [address] [place] Number [phone]

## 2013-12-14 NOTE — Progress Notes (Signed)
12/14/2013 11:10 AM  Pt disconnected from cycler.  Net UF for the last set of exchanges after power shut off was 1181 off.  Unsure of previous net UF before the cycler crashed overnight so am unsure of net UF total for entire treatment.  Pt does not appear volume overloaded though.  Fluid was clear, light yellow.  Pt tolerated treatment well.  Dr. Moshe Cipro at bedside currently.  Pt to discharge hopefully today, will continue to monitor in case he remains for treatment this evening. Princella Pellegrini

## 2013-12-14 NOTE — Progress Notes (Signed)
Occupational Therapy Treatment Patient Details Name: Sean Best MRN: [id number] DOB: Jan 07, 1934 Today's Date: 12/14/2013    History of present illness is a 80 y.o. male with history of ESRD on peritoneal dialysis since April 2015, was on hemodialysis prior to that, CVA without residual deficits, diet-controlled DM 2, HTN, HLD, prostate cancer, gout, recent shingles, presented to the ED with altered mental status   OT comments  Pt seen today for safety with ADLs and functional mobility. Pt overall at Mod I level with use of straight cane and demonstrated ability to retrieve items from low drawers and off floor without LOB. No further acute OT needs. Pt plans to d/c home today.    Follow Up Recommendations  Supervision/Assistance - 24 hour    Equipment Recommendations  None recommended by OT    Recommendations for Other Services      Precautions / Restrictions Precautions Precautions: Fall Restrictions Weight Bearing Restrictions: No       Mobility Bed Mobility Overal bed mobility: Modified Independent              Transfers Overall transfer level: Modified independent Equipment used: Straight cane         General transfer comment: mod i with transfers; no LOB with transfers; demo good balance with cane     Balance Overall balance assessment: No apparent balance deficits (not formally assessed)                              ADL Overall ADL's : Modified independent                                       General ADL Comments: Pt overall Mod I with ADLs and functional mobility. Pt demonstrated morning routine of getting OOB, ambulating to bathroom with cane, washing up at sink, and retrieving clothes from low drawers in room without LOB. Educated pt on sitting for showers and LB ADLs and pt agrees. No further acute OT needs.                 Cognition  Arousal/Alertness: Awake/Alert Behavior During Therapy: WFL for tasks  assessed/performed Overall Cognitive Status: Within Functional Limits for tasks assessed                                    Pertinent Vitals/ Pain       Pain Assessment: No/denies pain            Progress Toward Goals  OT Goals(current goals can now be found in the care plan section)  Progress towards OT goals: Goals met/education completed, patient discharged from OT  Acute Rehab OT Goals Patient Stated Goal: to walk with cane only  Plan All goals met and education completed, patient discharged from OT services       End of Session Equipment Utilized During Treatment: Gait belt   Activity Tolerance Patient tolerated treatment well   Patient Left with call bell/phone within reach;Other (comment) (sitting EOB)   Nurse Communication          Time: 6503-5465 OT Time Calculation (min): 18 min  Charges: OT General Charges $OT Visit: 1 Procedure OT Treatments $Self Care/Home Management : 8-22 mins  Villa Herb M 12/14/2013, 2:47 PM  Secundino Ginger Lynetta Mare, OTR/L Occupational  Therapist [phone] (pager)

## 2013-12-14 NOTE — Discharge Summary (Signed)
Physician Discharge Summary  Sean Best [id number] DOB: 1933-04-16 DOA: 12/09/2013  PCP: [name], MD  Admit date: 12/09/2013 Discharge date: 12/14/2013  Time spent: >35 minutes minutes  Recommendations for Outpatient Follow-up:  F/u with nephrologist as scheduled F/u with PCP in 1 week    Discharge Diagnoses:  Principal Problem:   Encephalopathy acute Active Problems:   Hyperkalemia   Anemia   DM (diabetes mellitus), type 2 with renal complications, diet controlled   Benign hypertension   Hyperlipidemia   ESRD on peritoneal dialysis   Uremia   Thigh shingles   Acute encephalopathy   Discharge Condition: stable   Diet recommendation: low sodium   Filed Weights   12/11/13 1815 12/13/13 0447 12/14/13 0622  Weight: 93.3 kg (205 lb 11 oz) 93.5 kg (206 lb 2.1 oz) 94.4 kg (208 lb 1.8 oz)    History of present illness:  80 y.o.M Hx ESRD on peritoneal dialysis since April 2015 (on hemodialysis prior to that), CVA without residual deficits, diet-controlled DM 2, HTN, HLD, prostate cancer, gout, and current shingles who presented to the ED with altered mental status. Patient lives alone and is independent in activities of daily living. He does manual peritoneal dialysis-4 cycles per day. The daughter was alerted that patient was confused, walking from room to room, talking to his demised wife. No reported fever, chills, nausea, vomiting, pain, dyspnea. He was recently started on Valtrex and opioids for right lower extremity shingles, 3 days prior.    Hospital Course:  1. Encephalopathy acute likely due to uremia, opioids, valacyclovir  - CT head: no acute abnormalities; neuro exam no focal;  - encephalopathy is resolved, currently at baseline mental status  - Patient had HD x2; resumed peritoneal cath per nephrology  - valacyclovir decreased the dose adjusted renally, hold opioids  2. ESRD on peritoneal dialysis presenting with hyperkalemia/plan Metabolic acidosis,  uremia  - Patient underwent emergent hemodialysis x2; resumed peritoneal cath per nephrology  3. Right lower extremity Shingles  - no significant pain, lesion started crust; valacyclovir adjusted renally; outpatient f/u in 1 week with PCP 4. DM (diabetes mellitus), type 2 with renal complications, diet controlled  5. Benign hypertension , resume home regimen; cont PD; titrate meds as tolerated as outpatient   6. Anemia likely anemia due to chronic disease  - Currently stable   Called to updated his daughter, no answer; d/w patient at length   Procedures:  HD (i.e. Studies not automatically included, echos, thoracentesis, etc; not x-rays)  Consultations:  Nephrology   Discharge Exam: Filed Vitals:   12/14/13 0622  BP: 180/89  Pulse: 98  Temp: 98.3 F (36.8 C)  Resp: 18    General: alert Cardiovascular: s1,s2 rrr Respiratory: CTA BL  Discharge Instructions  Discharge Instructions   Diet - low sodium heart healthy    Complete by:  As directed      Discharge instructions    Complete by:  As directed   Please follow up with primary care doctor in 1 week     Increase activity slowly    Complete by:  As directed             Medication List    STOP taking these medications       oxyCODONE-acetaminophen 5-325 MG per tablet  Commonly known as:  PERCOCET/ROXICET      TAKE these medications       ACCU-CHEK COMPACT STRIPS test strip  Generic drug:  glucose blood     acetaminophen  325 MG tablet  Commonly known as:  TYLENOL  Take 2 tablets (650 mg total) by mouth every 6 (six) hours as needed for mild pain (or Fever >/= 101).     allopurinol 100 MG tablet  Commonly known as:  ZYLOPRIM  Take 100 mg by mouth daily.     aspirin EC 325 MG tablet  Take 325 mg by mouth daily.     calcitRIOL 0.5 MCG capsule  Commonly known as:  ROCALTROL  Take 0.5 mcg by mouth every Monday, Wednesday, and Friday.     gentamicin ointment 0.1 %  Commonly known as:  GARAMYCIN   Apply 1 application topically 2 (two) times daily as needed (for infection control at [facility] site).     hydrALAZINE 50 MG tablet  Commonly known as:  APRESOLINE  Take 50 mg by mouth 2 (two) times daily.     LIPITOR 40 MG tablet  Generic drug:  atorvastatin  Take 40 mg by mouth daily.     metoprolol 100 MG tablet  Commonly known as:  LOPRESSOR  Take 100 mg by mouth 2 (two) times daily.     multivitamin Tabs tablet  Take 1 tablet by mouth daily.     sevelamer carbonate 800 MG tablet  Commonly known as:  RENVELA  Take 1,600-2,400 mg by mouth See admin instructions. Take 3 tablets (2400 mg) by mouth 3 times daily with meals and 2 tablets (1600 mg) twice daily with snacks     valACYclovir 1000 MG tablet  Commonly known as:  VALTREX  Take 0.5 tablets (500 mg total) by mouth daily.       Allergies  Allergen Reactions  . Cardura [Doxazosin Mesylate] Other (See Comments)    Hallucinations       Follow-up Information   Follow up with POLITE,RONALD D, MD In 1 week.   Specialty:  Internal Medicine   Contact information:   [address] [place] [address] [phone]        The results of significant diagnostics from this hospitalization (including imaging, microbiology, ancillary and laboratory) are listed below for reference.    Significant Diagnostic Studies: Dg Chest 2 View  12/09/2013   CLINICAL DATA:  Renal failure ; herpes zoster infection  EXAM: CHEST  2 VIEW  COMPARISON:  March 11, 2013  FINDINGS: Lungs are clear. Heart is enlarged, stable. Pulmonary vascularity is normal. No adenopathy. No bone lesions.  IMPRESSION: Cardiomegaly.  No edema or consolidation.   Electronically Signed   By: Lowella Grip M.D.   On: 12/09/2013 15:33   Ct Head Wo Contrast  12/09/2013   CLINICAL DATA:  Confusion. Patient combative. Initial evaluation could  EXAM: CT HEAD WITHOUT CONTRAST  TECHNIQUE: Contiguous axial images were obtained from the base of the skull  through the vertex without intravenous contrast.  COMPARISON:  03/11/2013 .  FINDINGS: Motion artifact is present.No intra-axial or extra-axial pathologic fluid or blood collection. No mass. No hydrocephalus. No acute bony abnormality.  IMPRESSION: Limited exam due to motion artifact. No acute abnormality identified.   Electronically Signed   By: Marcello Moores  Register   On: 12/09/2013 18:57    Microbiology: Recent Results (from the past 240 hour(s))  MRSA PCR SCREENING     Status: None   Collection Time    12/10/13  4:25 AM      Result Value Ref Range Status   MRSA by PCR NEGATIVE  NEGATIVE Final   Comment:  The GeneXpert MRSA Assay (FDA     approved for NASAL specimens     only), is one component of a     comprehensive MRSA colonization     surveillance program. It is not     intended to diagnose MRSA     infection nor to guide or     monitor treatment for     MRSA infections.  CULTURE, BLOOD (ROUTINE X 2)     Status: None   Collection Time    12/10/13  8:40 PM      Result Value Ref Range Status   Specimen Description BLOOD RIGHT ANTECUBITAL   Final   Special Requests BOTTLES DRAWN AEROBIC AND ANAEROBIC [place] EA   Final   Culture  Setup Time     Final   Value: 12/11/2013 02:37     Performed at [facility]   Culture     Final   Value:        BLOOD CULTURE RECEIVED NO GROWTH TO DATE CULTURE WILL BE HELD FOR 5 DAYS BEFORE ISSUING A FINAL NEGATIVE REPORT     Performed at [facility]   Report Status PENDING   Incomplete  CULTURE, BLOOD (ROUTINE X 2)     Status: None   Collection Time    12/10/13  8:45 PM      Result Value Ref Range Status   Specimen Description BLOOD RIGHT FOREARM   Final   Special Requests BOTTLES DRAWN AEROBIC AND ANAEROBIC [place] EA   Final   Culture  Setup Time     Final   Value: 12/11/2013 02:37     Performed at [facility]   Culture     Final   Value:        BLOOD CULTURE RECEIVED NO GROWTH TO DATE CULTURE WILL BE HELD FOR 5  DAYS BEFORE ISSUING A FINAL NEGATIVE REPORT     Performed at [facility]   Report Status PENDING   Incomplete     Labs: Basic Metabolic Panel:  Recent Labs Lab 12/10/13 0001 12/10/13 0500 12/11/13 0333 12/12/13 0605 12/13/13 0524  NA 134* 134* 136* 136* 137  K 6.0* 5.2 4.1 3.7 3.5*  CL 101 100 97 98 98  CO2 14* 15* 22 25 24   GLUCOSE 56* 108* 96 94 130*  BUN 99* 95* 36* 26* 29*  CREATININE 17.27* 17.31* 9.34* 7.76* 8.36*  CALCIUM 8.9 8.8 8.3* 8.3* 8.6  MG  --   --  1.8  --   --   PHOS  --   --  3.6 3.6 3.4   Liver Function Tests:  Recent Labs Lab 12/09/13 1543 12/11/13 0333 12/12/13 0605 12/13/13 0524  AST 16 19  --   --   ALT 17 14  --   --   ALKPHOS 52 47  --   --   BILITOT <0.2* 0.3  --   --   PROT 6.6 5.9*  --   --   ALBUMIN 3.6 2.9* 2.7* 2.6*   No results found for this basename: LIPASE, AMYLASE,  in the last 168 hours  Recent Labs Lab 12/09/13 1730  AMMONIA 50   CBC:  Recent Labs Lab 12/09/13 1543 12/10/13 0500 12/11/13 0333 12/12/13 0605  WBC 4.7 3.8* 5.8 5.9  NEUTROABS 2.7  --  3.3  --   HGB 11.1* 10.7* 11.3* 10.4*  HCT 33.8* 32.7* 35.1* 32.6*  MCV 92.6 92.9 95.4 96.4  PLT 168 151 169  166   Cardiac Enzymes: No results found for this basename: CKTOTAL, CKMB, CKMBINDEX, TROPONINI,  in the last 168 hours BNP: BNP (last 3 results) No results found for this basename: PROBNP,  in the last 8760 hours CBG:  Recent Labs Lab 12/13/13 0636 12/13/13 1120 12/13/13 1625 12/13/13 2139 12/14/13 0618  GLUCAP 140* 110* 111* 109* 120*       Signed:  Zakaree Mcclenahan N  [facility] 12/14/2013, 10:52 AM

## 2013-12-14 NOTE — Progress Notes (Signed)
Patient placed on Cycler machine for PD. Patient tolerated procedure, no complications occurred.

## 2013-12-17 LAB — CULTURE, BLOOD (ROUTINE X 2)
CULTURE: NO GROWTH
CULTURE: NO GROWTH

## 2014-06-02 ENCOUNTER — Encounter (HOSPITAL_COMMUNITY): Payer: Self-pay | Admitting: *Deleted

## 2014-06-02 ENCOUNTER — Emergency Department (HOSPITAL_COMMUNITY)
Admission: EM | Admit: 2014-06-02 | Discharge: 2014-06-03 | Disposition: A | Discharge status: Home / Self Care | Payer: Medicare Other | Attending: Emergency Medicine | Admitting: Emergency Medicine

## 2014-06-02 ENCOUNTER — Emergency Department (HOSPITAL_COMMUNITY): Payer: Medicare Other

## 2014-06-02 DIAGNOSIS — Z7982 Long term (current) use of aspirin: Secondary | ICD-10-CM | POA: Diagnosis not present

## 2014-06-02 DIAGNOSIS — M1990 Unspecified osteoarthritis, unspecified site: Secondary | ICD-10-CM | POA: Insufficient documentation

## 2014-06-02 DIAGNOSIS — T8241XA Breakdown (mechanical) of vascular dialysis catheter, initial encounter: Secondary | ICD-10-CM | POA: Insufficient documentation

## 2014-06-02 DIAGNOSIS — Z8673 Personal history of transient ischemic attack (TIA), and cerebral infarction without residual deficits: Secondary | ICD-10-CM | POA: Diagnosis not present

## 2014-06-02 DIAGNOSIS — Z8546 Personal history of malignant neoplasm of prostate: Secondary | ICD-10-CM | POA: Insufficient documentation

## 2014-06-02 DIAGNOSIS — N186 End stage renal disease: Secondary | ICD-10-CM

## 2014-06-02 DIAGNOSIS — Z79899 Other long term (current) drug therapy: Secondary | ICD-10-CM | POA: Diagnosis not present

## 2014-06-02 DIAGNOSIS — Z8619 Personal history of other infectious and parasitic diseases: Secondary | ICD-10-CM | POA: Diagnosis not present

## 2014-06-02 DIAGNOSIS — Y841 Kidney dialysis as the cause of abnormal reaction of the patient, or of later complication, without mention of misadventure at the time of the procedure: Secondary | ICD-10-CM | POA: Insufficient documentation

## 2014-06-02 DIAGNOSIS — I120 Hypertensive chronic kidney disease with stage 5 chronic kidney disease or end stage renal disease: Secondary | ICD-10-CM | POA: Insufficient documentation

## 2014-06-02 DIAGNOSIS — E119 Type 2 diabetes mellitus without complications: Secondary | ICD-10-CM | POA: Insufficient documentation

## 2014-06-02 DIAGNOSIS — E785 Hyperlipidemia, unspecified: Secondary | ICD-10-CM | POA: Diagnosis not present

## 2014-06-02 DIAGNOSIS — M109 Gout, unspecified: Secondary | ICD-10-CM | POA: Diagnosis not present

## 2014-06-02 DIAGNOSIS — N2889 Other specified disorders of kidney and ureter: Secondary | ICD-10-CM

## 2014-06-02 DIAGNOSIS — Z862 Personal history of diseases of the blood and blood-forming organs and certain disorders involving the immune mechanism: Secondary | ICD-10-CM | POA: Insufficient documentation

## 2014-06-02 DIAGNOSIS — Z8719 Personal history of other diseases of the digestive system: Secondary | ICD-10-CM | POA: Insufficient documentation

## 2014-06-02 DIAGNOSIS — E663 Overweight: Secondary | ICD-10-CM | POA: Diagnosis not present

## 2014-06-02 DIAGNOSIS — Z992 Dependence on renal dialysis: Secondary | ICD-10-CM | POA: Diagnosis not present

## 2014-06-02 DIAGNOSIS — T85611A Breakdown (mechanical) of intraperitoneal dialysis catheter, initial encounter: Secondary | ICD-10-CM

## 2014-06-02 DIAGNOSIS — I151 Hypertension secondary to other renal disorders: Secondary | ICD-10-CM

## 2014-06-02 LAB — COMPREHENSIVE METABOLIC PANEL
ALK PHOS: 58 U/L (ref 39–117)
ALT: 21 U/L (ref 0–53)
AST: 14 U/L (ref 0–37)
Albumin: 2.3 g/dL — ABNORMAL LOW (ref 3.5–5.2)
Anion gap: 14 (ref 5–15)
BUN: 85 mg/dL — ABNORMAL HIGH (ref 6–23)
CO2: 25 mmol/L (ref 19–32)
Calcium: 8.7 mg/dL (ref 8.4–10.5)
Chloride: 102 mmol/L (ref 96–112)
Creatinine, Ser: 13.56 mg/dL — ABNORMAL HIGH (ref 0.50–1.35)
GFR calc Af Amer: 3 mL/min — ABNORMAL LOW (ref >90)
GFR calc non Af Amer: 3 mL/min — ABNORMAL LOW (ref >90)
GLUCOSE: 103 mg/dL — AB (ref 70–99)
POTASSIUM: 4.4 mmol/L (ref 3.5–5.1)
SODIUM: 141 mmol/L (ref 135–145)
TOTAL PROTEIN: 5.8 g/dL — AB (ref 6.0–8.3)
Total Bilirubin: 0.3 mg/dL (ref 0.3–1.2)

## 2014-06-02 LAB — CBC WITH DIFFERENTIAL/PLATELET
BASOS ABS: 0 10*3/uL (ref 0.0–0.1)
BASOS PCT: 0 % (ref 0–1)
EOS PCT: 0 % (ref 0–5)
Eosinophils Absolute: 0.1 10*3/uL (ref 0.0–0.7)
HEMATOCRIT: 31.6 % — AB (ref 39.0–52.0)
HEMOGLOBIN: 9.6 g/dL — AB (ref 13.0–17.0)
LYMPHS ABS: 1.1 10*3/uL (ref 0.7–4.0)
Lymphocytes Relative: 8 % — ABNORMAL LOW (ref 12–46)
MCH: 27.7 pg (ref 26.0–34.0)
MCHC: 30.4 g/dL (ref 30.0–36.0)
MCV: 91.1 fL (ref 78.0–100.0)
MONO ABS: 0.6 10*3/uL (ref 0.1–1.0)
Monocytes Relative: 5 % (ref 3–12)
Neutro Abs: 11.3 10*3/uL — ABNORMAL HIGH (ref 1.7–7.7)
Neutrophils Relative %: 87 % — ABNORMAL HIGH (ref 43–77)
PLATELETS: 320 10*3/uL (ref 150–400)
RBC: 3.47 MIL/uL — ABNORMAL LOW (ref 4.22–5.81)
RDW: 15.8 % — AB (ref 11.5–15.5)
WBC: 13.1 10*3/uL — ABNORMAL HIGH (ref 4.0–10.5)

## 2014-06-02 LAB — URINE MICROSCOPIC-ADD ON

## 2014-06-02 LAB — URINALYSIS, ROUTINE W REFLEX MICROSCOPIC
Bilirubin Urine: NEGATIVE
Glucose, UA: 100 mg/dL — AB
Ketones, ur: NEGATIVE mg/dL
LEUKOCYTES UA: NEGATIVE
NITRITE: NEGATIVE
PH: 8 (ref 5.0–8.0)
PROTEIN: 100 mg/dL — AB
SPECIFIC GRAVITY, URINE: 1.012 (ref 1.005–1.030)
Urobilinogen, UA: 0.2 mg/dL (ref 0.0–1.0)

## 2014-06-02 MED ORDER — LISINOPRIL 10 MG PO TABS
10.0000 mg | ORAL_TABLET | Freq: Once | ORAL | Status: AC
Start: 2014-06-02 — End: 2014-06-02
  Administered 2014-06-02: 10 mg via ORAL
  Filled 2014-06-02: qty 1

## 2014-06-02 MED ORDER — FUROSEMIDE 20 MG PO TABS
80.0000 mg | ORAL_TABLET | Freq: Once | ORAL | Status: AC
  Administered 2014-06-02: 80 mg via ORAL
  Filled 2014-06-02: qty 4

## 2014-06-02 MED ORDER — HYDRALAZINE HCL 50 MG PO TABS
100.0000 mg | ORAL_TABLET | Freq: Once | ORAL | Status: AC
  Administered 2014-06-02: 100 mg via ORAL
  Filled 2014-06-02: qty 2

## 2014-06-02 NOTE — ED Provider Notes (Signed)
CSN: [id number]     Arrival date & time 06/02/14  2048 History   First MD Initiated Contact with Patient 06/02/14 2135     Chief Complaint  Patient presents with  . pertoneal cath blockage      (Consider location/radiation/quality/duration/timing/severity/associated sxs/prior Treatment) The history is provided by the patient and medical records. No language interpreter was used.     Sean Best is a 81 y.o. male  with a hx of CVA, ESRD (on peritoneal dialysis), gout, HTN presents to the Emergency Department complaining of a clogged peritoneal dialysis cathter.    Pt reports he began his dialysis and infused 2336mL of fluid but the catheter became clogged and no fluid was able to be removed.  Pt reports mild abd discomfort, but no significant pain.  He has discussed the situation with PD nurse on call.  They have attempted a laxative usage and patient has had multiple bowel movements. They have also attempted 6 mL of heparin which was allowed to sit in the catheter but no fluid was able to be withdrawn with a syringe afterwards.  Patient's daughter reports that the catheter is able to be flushed but would not drain. Dialysis clinic recommended the patient presented to the emergency department for further evaluation and treatment after 24 hours of attempting to unclog the catheter at home.  Patient's PD catheter was placed in January 2015 by Dr. Waymon Amato worth. He currently has a left arm fistula in place for hemodialysis, but this has not been used since placement of the PD catheter. Patient and family deny fevers, chills, headache, neck pain, chest pain, shortness of breath, nausea, vomiting, diarrhea.  Pt reports manual dialysis last week without heparin as he was out of town.    Past Medical History  Diagnosis Date  . Stroke   . Claudication   . Diabetes mellitus   . ESRD on peritoneal dialysis     Started dialysis around April 2015 per son.  Has been doing peritoneal dialysis at home.     Marland Kitchen  Hyperlipidemia   . Cancer     prostate cancer  . Gout   . Overweight(278.02)   . Arthritis   . Chronic total occlusion of artery of the extremities     pt not aware of this  . Umbilical hernia   . Anemia   . Shingles   . Hypertension    Past Surgical History  Procedure Laterality Date  . Prostate surgery      prostate seed placement  . Av fistula placement, radiocephalic  [id number]    Left arm  . Eye surgery Left     cataract surgery  . Eye surgery Left     for eye injury  . Capd insertion N/A 03/06/2013    Procedure: LAPAROSCOPIC INSERTION CONTINUOUS AMBULATORY PERITONEAL DIALYSIS  (CAPD) CATHETER;  Surgeon: Edward Jolly, MD;  Location: [place];  Service: General;  Laterality: N/A;  . Umbilical hernia repair N/A 03/06/2013    Procedure: HERNIA REPAIR UMBILICAL WITH MESH;  Surgeon: Edward Jolly, MD;  Location: [facility] OR;  Service: General;  Laterality: N/A;   Family History  Problem Relation Age of Onset  . Cancer Mother   . Heart disease Mother   . Cancer Father    History  Substance Use Topics  . Smoking status: Never Smoker   . Smokeless tobacco: Never Used  . Alcohol Use: No    Review of Systems  Constitutional: Negative for fever, diaphoresis, appetite change, fatigue  and unexpected weight change.  HENT: Negative for mouth sores.   Eyes: Negative for visual disturbance.  Respiratory: Negative for cough, chest tightness, shortness of breath and wheezing.   Cardiovascular: Negative for chest pain.  Gastrointestinal: Positive for abdominal distention. Negative for nausea, vomiting, abdominal pain, diarrhea and constipation.  Endocrine: Negative for polydipsia, polyphagia and polyuria.  Genitourinary: Negative for dysuria, urgency, frequency and hematuria.  Musculoskeletal: Negative for back pain and neck stiffness.  Skin: Negative for rash.  Allergic/Immunologic: Negative for immunocompromised state.  Neurological: Negative for syncope, light-headedness and  headaches.  Hematological: Does not bruise/bleed easily.  Psychiatric/Behavioral: Negative for sleep disturbance. The patient is not nervous/anxious.       Allergies  Cardura  Home Medications   Prior to Admission medications   Medication Sig Start Date End Date Taking? Authorizing Provider  acetaminophen (TYLENOL) 500 MG tablet Take 1,000 mg by mouth daily as needed for headache.   Yes Historical Provider, MD  allopurinol (ZYLOPRIM) 100 MG tablet Take 100 mg by mouth daily.     Yes Historical Provider, MD  aspirin EC 325 MG tablet Take 325 mg by mouth daily.   Yes Historical Provider, MD  atorvastatin (LIPITOR) 40 MG tablet Take 40 mg by mouth daily.     Yes Historical Provider, MD  calcitRIOL (ROCALTROL) 0.25 MCG capsule Take 0.25 mcg by mouth. Take 0.25 mcg on Mon, Wed, and Friday 03/13/14  Yes Historical Provider, MD  diphenhydrAMINE (BENADRYL) 25 MG tablet Take 25 mg by mouth daily as needed for itching.   Yes Historical Provider, MD  furosemide (LASIX) 80 MG tablet Take 80 mg by mouth 2 (two) times daily. 05/07/14  Yes Historical Provider, MD  hydrALAZINE (APRESOLINE) 100 MG tablet Take 100 mg by mouth 3 (three) times daily. 05/09/14  Yes Historical Provider, MD  lisinopril (PRINIVIL,ZESTRIL) 10 MG tablet Take 10 mg by mouth daily. 05/15/14  Yes Historical Provider, MD  metoprolol (LOPRESSOR) 100 MG tablet Take 100 mg by mouth 2 (two) times daily.   Yes Historical Provider, MD  multivitamin (RENA-VIT) TABS tablet Take 1 tablet by mouth daily.   Yes Historical Provider, MD  sevelamer carbonate (RENVELA) 800 MG tablet Take 1,600-2,400 mg by mouth See admin instructions. Take 3 tablets (2400 mg) by mouth 3 times daily with meals and 2 tablets (1600 mg) twice daily with snacks   Yes Historical Provider, MD  ACCU-CHEK COMPACT STRIPS test strip  05/20/10   Historical Provider, MD  acetaminophen (TYLENOL) 325 MG tablet Take 2 tablets (650 mg total) by mouth every 6 (six) hours as needed for mild  pain (or Fever >/= 101). Patient not taking: Reported on 06/02/2014 12/14/13   Kinnie Feil, MD  valACYclovir (VALTREX) 1000 MG tablet Take 0.5 tablets (500 mg total) by mouth daily. Patient not taking: Reported on 06/02/2014 12/14/13   Kinnie Feil, MD   BP 190/105 mmHg  Pulse 95  Temp(Src) 98.8 F (37.1 C) (Rectal)  Resp 22  SpO2 95% Physical Exam  Constitutional: He appears well-developed and well-nourished. No distress.  Awake, alert, nontoxic appearance  HENT:  Head: Normocephalic and atraumatic.  Mouth/Throat: Oropharynx is clear and moist. No oropharyngeal exudate.  Eyes: Conjunctivae are normal. No scleral icterus.  Neck: Normal range of motion. Neck supple.  Cardiovascular: Normal rate, regular rhythm, normal heart sounds and intact distal pulses.   Pulmonary/Chest: Effort normal and breath sounds normal. No respiratory distress. He has no wheezes.  Equal chest expansion  Abdominal: Soft. Bowel sounds  are normal. He exhibits distension. He exhibits no mass. There is no tenderness. There is no rebound and no guarding.  Abdomen is distended but soft PD catheter in place in the right lower quadrant without erythema or drainage from the site  Musculoskeletal: Normal range of motion. He exhibits no edema.  Neurological: He is alert.  Speech is clear and goal oriented Moves extremities without ataxia  Skin: Skin is warm and dry. He is not diaphoretic.  Psychiatric: He has a normal mood and affect.  Nursing note and vitals reviewed.   ED Course  Procedures (including critical care time) Labs Review Labs Reviewed  CBC WITH DIFFERENTIAL/PLATELET - Abnormal; Notable for the following:    WBC 13.1 (*)    RBC 3.47 (*)    Hemoglobin 9.6 (*)    HCT 31.6 (*)    RDW 15.8 (*)    Neutrophils Relative % 87 (*)    Neutro Abs 11.3 (*)    Lymphocytes Relative 8 (*)    All other components within normal limits  COMPREHENSIVE METABOLIC PANEL - Abnormal; Notable for the following:     Glucose, Bld 103 (*)    BUN 85 (*)    Creatinine, Ser 13.56 (*)    Total Protein 5.8 (*)    Albumin 2.3 (*)    GFR calc non Af Amer 3 (*)    GFR calc Af Amer 3 (*)    All other components within normal limits  URINALYSIS, ROUTINE W REFLEX MICROSCOPIC - Abnormal; Notable for the following:    Glucose, UA 100 (*)    Hgb urine dipstick TRACE (*)    Protein, ur 100 (*)    All other components within normal limits  URINE MICROSCOPIC-ADD ON    Imaging Review Dg Abd 1 View  06/03/2014   CLINICAL DATA:  Abdominal pain for 1 day  EXAM: ABDOMEN - 1 VIEW  COMPARISON:  None  FINDINGS: Catheter overlies the lower abdomen and pelvis. There is air in small and large bowel without evidence of obstruction or perforation. There is a suggestion of thumbprinting in a left abdominal bowel loop and this can be seen with mural edema. No biliary or urinary calculi are evident.  IMPRESSION: Negative for obstruction or perforation, but there is a suggestion of thumbprinting which may indicate mural edema.   Electronically Signed   By: Andreas Newport M.D.   On: 06/03/2014 00:00     EKG Interpretation None      MDM   Final diagnoses:  ESRD on peritoneal dialysis  Peritoneal dialysis catheter dysfunction, initial encounter  Hypertension secondary to other renal disorders   Sean Best presents with blockage of his PD catheter.  Pt has tried multiple attempts to unclog the catheter at home without success and was send to the ED by the dialysis center for further evaluation.  Will discuss with nephrology.     10:06 PM Discussed with Dr. Lorrene Reid who recommends KUB and renal panel.  Will reconsult after confirmation of placement.  Pt in NAD at this time.    1:05 AM KUB shows PD catheter in the lower abdomen and pelvis. Urinalysis without evidence of urinary tract infection. Electrolytes are within normal limits. Electrolytes discussed with Dr. Lorrene Reid several hours ago however at that time his  leukocytosis was not discussed.  Pt initially with documented low grade oral fever at triage, but no fever when checked rectally.  Pt denies fevers at home.  Pt abd continues to be soft and nontender  on exam.  No systemic signs of infection, but potential is high.  Pt is to see the dialysis clinic on Monday morning for further intervention with his PD cathether.  Strict return precautions have been given including abd pain, vomiting, fever or other concerns of infection.  No signs of sepsis.    The patient was discussed with and seen by Dr. Ralene Bathe who agrees with the treatment plan.   Jarrett Soho Azaan Leask, PA-C 06/03/14 0109  Quintella Reichert, MD 06/03/14 [phone]

## 2014-06-02 NOTE — ED Notes (Signed)
The pt is on peritoneal dialysis and since last pm  His peritoneal fluid is not not draining.  Fluid goes in but not out

## 2014-06-03 NOTE — Discharge Instructions (Signed)
1. Medications: usual home medications 2. Treatment: rest, drink plenty of fluids, take medications as directed 3. Follow Up: Please followup with your primary doctor and the dialysis center on Monday morning for discussion of your diagnoses and further evaluation after today's visit; if you do not have a primary care doctor use the resource guide provided to find one; Please return to the ER for  abd pain, vomiting, fever or other concerns

## 2014-06-08 ENCOUNTER — Other Ambulatory Visit: Payer: Self-pay | Admitting: General Surgery

## 2014-08-23 ENCOUNTER — Encounter: Payer: Self-pay | Admitting: Nurse Practitioner

## 2014-08-29 ENCOUNTER — Encounter: Payer: Self-pay | Admitting: Gastroenterology

## 2014-08-29 ENCOUNTER — Ambulatory Visit (INDEPENDENT_AMBULATORY_CARE_PROVIDER_SITE_OTHER): Payer: Medicare Other | Admitting: Gastroenterology

## 2014-08-29 ENCOUNTER — Other Ambulatory Visit (INDEPENDENT_AMBULATORY_CARE_PROVIDER_SITE_OTHER): Payer: Medicare Other

## 2014-08-29 VITALS — BP 150/98 | HR 85 | Ht 66.0 in | Wt 191.0 lb

## 2014-08-29 DIAGNOSIS — R197 Diarrhea, unspecified: Secondary | ICD-10-CM | POA: Diagnosis not present

## 2014-08-29 LAB — TSH: TSH: 2.35 u[IU]/mL (ref 0.35–4.50)

## 2014-08-29 LAB — IGA: IgA: 129 mg/dL (ref 68–378)

## 2014-08-29 MED ORDER — DIPHENOXYLATE-ATROPINE 2.5-0.025 MG PO TABS: ORAL_TABLET | ORAL | Status: DC

## 2014-08-29 MED ORDER — DIPHENOXYLATE-ATROPINE 2.5-0.025 MG PO TABS: 1.0000 | ORAL_TABLET | Freq: Four times a day (QID) | ORAL | Status: DC | PRN

## 2014-08-29 NOTE — Progress Notes (Signed)
Agree with initial assessment and plans as outlined 

## 2014-08-29 NOTE — Patient Instructions (Addendum)
Your physician has requested that you go to the basement for lab work before leaving today  We have printed your medications for you to take to you pharmacy at your convenience: Lomotil  We have requested your records from [facility] and will review  Today your blood pressure was elevated. Please follow up with your Primary Care Provider for blood pressure management.

## 2014-08-29 NOTE — Progress Notes (Signed)
08/29/2014 Sean Best [id number] 1933/10/01   HISTORY OF PRESENT ILLNESS:  This is a pleasant 81 year old male who has ESRD presumably from HTN and DM and is on HD TTS.  He presents here to our office today with his daughter to discuss recent issues with diarrhea.  He has been referred here by his PCP and nephrologist because he's had incontinence with the diarrhea and therefore is refusing to go to dialysis at times due to embarrassment, etc.  Diarrhea began rather suddenly about a month ago.  Prior to that he was having quite regular BM's and no diarrhea.  Now he is having multiple (10-12) watery BM's per day with incontinence.  He is even having to wake up at night to go.  Denies any blood in his stool or abdominal pain.  Is taking 6 Imodium a day at times and still has diarrhea.  He has been receiving an antibiotic at dialysis per his daughter, however, she is unsure what antibiotic and why he is receiving it, etc.  Denies any recent travel.  He was recently started on lisinopril about 2 months ago by his nephrologist, however, they say that the nephrologist does not think that the diarrhea is from the medication.  He apparently had a colonoscopy with [facility] at some point, however, not exactly sure when, but daughter does not think that they found anything.  No nausea or vomiting, but appetite has not been very good.  Labs 08/09/2014 showed Hgb 10.7 grams but normal MCV and iron levels, normal WBC count.  Normal LFT's 06/2014.   Past Medical History  Diagnosis Date  . Stroke   . Claudication   . Diabetes mellitus   . ESRD on peritoneal dialysis     Started dialysis around April 2015 per son.  Has been doing peritoneal dialysis at home.     Marland Kitchen Hyperlipidemia   . Cancer     prostate cancer  . Gout   . Overweight(278.02)   . Arthritis   . Chronic total occlusion of artery of the extremities     pt not aware of this  . Umbilical hernia   . Anemia   . Shingles   . Hypertension     Past Surgical History  Procedure Laterality Date  . Prostate surgery      prostate seed placement  . Av fistula placement, radiocephalic  123456    Left arm  . Eye surgery Left     cataract surgery  . Eye surgery Left     for eye injury  . Capd insertion N/A 03/06/2013    Procedure: LAPAROSCOPIC INSERTION CONTINUOUS AMBULATORY PERITONEAL DIALYSIS  (CAPD) CATHETER;  Surgeon: Edward Jolly, MD;  Location: [place];  Service: General;  Laterality: N/A;  . Umbilical hernia repair N/A 03/06/2013    Procedure: HERNIA REPAIR UMBILICAL WITH MESH;  Surgeon: Edward Jolly, MD;  Location: [place];  Service: General;  Laterality: N/A;    reports that he has never smoked. He has never used smokeless tobacco. He reports that he does not drink alcohol or use illicit drugs. family history includes Cancer in his father and mother; Heart disease in his mother. Allergies  Allergen Reactions  . Cardura [Doxazosin Mesylate] Other (See Comments)    Hallucinations      Outpatient Encounter Prescriptions as of 08/29/2014  Medication Sig  . ACCU-CHEK COMPACT STRIPS test strip   . acetaminophen (TYLENOL) 500 MG tablet Take 1,000 mg by mouth daily  as needed for headache.  . allopurinol (ZYLOPRIM) 100 MG tablet Take 100 mg by mouth daily.    Marland Kitchen aspirin EC 325 MG tablet Take 325 mg by mouth daily.  Marland Kitchen atorvastatin (LIPITOR) 40 MG tablet Take 40 mg by mouth daily.    . calcitRIOL (ROCALTROL) 0.25 MCG capsule Take 0.25 mcg by mouth. Take 0.25 mcg on Mon, Wed, and Friday  . diphenhydrAMINE (BENADRYL) 25 MG tablet Take 25 mg by mouth daily as needed for itching.  . furosemide (LASIX) 80 MG tablet Take 80 mg by mouth 2 (two) times daily.  . hydrALAZINE (APRESOLINE) 100 MG tablet Take 100 mg by mouth 3 (three) times daily.  Marland Kitchen lisinopril (PRINIVIL,ZESTRIL) 10 MG tablet Take 10 mg by mouth daily.  . metoprolol (LOPRESSOR) 100 MG tablet Take 100 mg by mouth 2 (two) times daily.  . multivitamin (RENA-VIT) TABS  tablet Take 1 tablet by mouth daily.  . sevelamer carbonate (RENVELA) 800 MG tablet Take 1,600-2,400 mg by mouth See admin instructions. Take 3 tablets (2400 mg) by mouth 3 times daily with meals and 2 tablets (1600 mg) twice daily with snacks  . diphenoxylate-atropine (LOMOTIL) 2.5-0.025 MG per tablet Take 1 tab by mouth every 4 to 6 hrs as needed  . [DISCONTINUED] acetaminophen (TYLENOL) 325 MG tablet Take 2 tablets (650 mg total) by mouth every 6 (six) hours as needed for mild pain (or Fever >/= 101). (Patient not taking: Reported on 06/02/2014)  . [DISCONTINUED] diphenoxylate-atropine (LOMOTIL) 2.5-0.025 MG per tablet Take 1 tablet by mouth 4 (four) times daily as needed for diarrhea or loose stools.  . [DISCONTINUED] valACYclovir (VALTREX) 1000 MG tablet Take 0.5 tablets (500 mg total) by mouth daily. (Patient not taking: Reported on 06/02/2014)   No facility-administered encounter medications on file as of 08/29/2014.     REVIEW OF SYSTEMS  : All other systems reviewed and negative except where noted in the History of Present Illness.   PHYSICAL EXAM: BP 150/98 mmHg  Pulse 85  Ht 5\' 6"  (1.676 m)  Wt 191 lb (86.637 kg)  BMI 30.84 kg/m2  SpO2 97% General: Well developed black male in no acute distress Head: Normocephalic and atraumatic Eyes:  Sclerae anicteric, conjunctiva pink. Ears: Normal auditory acuity Lungs: Clear throughout to auscultation Heart: Regular rate and rhythm Abdomen: Soft, non-distended.  Some incisional scars noted on abdomen.  Normal bowel sounds.  Non-tender. Musculoskeletal: Symmetrical with no gross deformities  Skin: No lesions on visible extremities Extremities: No edema  Neurological: Alert oriented x 4, grossly non-focal Psychological:  Alert and cooperative. Normal mood and affect  ASSESSMENT AND PLAN: -Diarrhea:  Abrupt onset one month ago and has been persistent with watery stools/incontinence multiple times per day.  He is getting IV antibiotics at  dialysis per his daughter's report (not sure which antibiotic).  History is consistent with infectious gastroenteritis, however, symptoms are prolonged.  Will rule out infectious source, particularly Cdiff, with stool GI pathogen panel.  Will also check TSH and celiac labs although those issues are much less likely.  ? Ischemia related to dialysis but there is no blood or abdominal pain.  ? Due to lisinopril that was started recently, however, nephrologist does not think so.  Will obtain results from previous colonoscopy at [place] and review those results, however, many need a repeat colonoscopy if symptoms fail to improve and no other source can be found.  Could consider a course of flagyl for empiric treatment of infectious source as well even if  stool studies negative.  Imodium has been ineffective with several pills per day.  Will try Lomotil prn for him to use before dialysis.   CC:  Seward Carol, MD

## 2014-08-31 ENCOUNTER — Other Ambulatory Visit: Payer: Medicare Other

## 2014-08-31 DIAGNOSIS — R197 Diarrhea, unspecified: Secondary | ICD-10-CM

## 2014-09-04 ENCOUNTER — Other Ambulatory Visit: Payer: Self-pay | Admitting: *Deleted

## 2014-09-04 ENCOUNTER — Ambulatory Visit: Payer: Medicare Other | Admitting: Nurse Practitioner

## 2014-09-04 DIAGNOSIS — R197 Diarrhea, unspecified: Secondary | ICD-10-CM

## 2014-09-07 ENCOUNTER — Other Ambulatory Visit: Payer: Medicare Other

## 2014-09-07 DIAGNOSIS — R197 Diarrhea, unspecified: Secondary | ICD-10-CM

## 2014-09-10 ENCOUNTER — Other Ambulatory Visit: Payer: Self-pay | Admitting: *Deleted

## 2014-09-10 LAB — OTHER SOLSTAS TEST

## 2014-09-10 MED ORDER — METRONIDAZOLE 250 MG PO TABS: ORAL_TABLET | ORAL | Status: DC

## 2014-09-11 LAB — TISSUE TRANSGLUTAMINASE, IGA: Tissue Transglutaminase Ab, IgA: 1 U/mL (ref <4)

## 2014-10-04 DIAGNOSIS — A0472 Enterocolitis due to Clostridium difficile, not specified as recurrent: Secondary | ICD-10-CM | POA: Insufficient documentation

## 2014-10-09 ENCOUNTER — Other Ambulatory Visit: Payer: Self-pay | Admitting: Gastroenterology

## 2014-12-28 ENCOUNTER — Emergency Department (HOSPITAL_COMMUNITY): Payer: Medicare Other

## 2014-12-28 ENCOUNTER — Observation Stay (HOSPITAL_COMMUNITY)
Admission: EM | Admit: 2014-12-28 | Discharge: 2014-12-30 | Disposition: A | Discharge status: Home / Self Care | Payer: Medicare Other | Attending: Family Medicine | Admitting: Family Medicine

## 2014-12-28 ENCOUNTER — Encounter (HOSPITAL_COMMUNITY): Payer: Self-pay | Admitting: Emergency Medicine

## 2014-12-28 DIAGNOSIS — M109 Gout, unspecified: Secondary | ICD-10-CM | POA: Diagnosis not present

## 2014-12-28 DIAGNOSIS — E663 Overweight: Secondary | ICD-10-CM | POA: Diagnosis not present

## 2014-12-28 DIAGNOSIS — E785 Hyperlipidemia, unspecified: Secondary | ICD-10-CM | POA: Diagnosis not present

## 2014-12-28 DIAGNOSIS — Z8673 Personal history of transient ischemic attack (TIA), and cerebral infarction without residual deficits: Secondary | ICD-10-CM | POA: Diagnosis not present

## 2014-12-28 DIAGNOSIS — I10 Essential (primary) hypertension: Secondary | ICD-10-CM

## 2014-12-28 DIAGNOSIS — Z794 Long term (current) use of insulin: Secondary | ICD-10-CM | POA: Diagnosis not present

## 2014-12-28 DIAGNOSIS — M1990 Unspecified osteoarthritis, unspecified site: Secondary | ICD-10-CM | POA: Diagnosis not present

## 2014-12-28 DIAGNOSIS — I739 Peripheral vascular disease, unspecified: Secondary | ICD-10-CM | POA: Insufficient documentation

## 2014-12-28 DIAGNOSIS — I120 Hypertensive chronic kidney disease with stage 5 chronic kidney disease or end stage renal disease: Secondary | ICD-10-CM | POA: Insufficient documentation

## 2014-12-28 DIAGNOSIS — Z888 Allergy status to other drugs, medicaments and biological substances status: Secondary | ICD-10-CM | POA: Diagnosis not present

## 2014-12-28 DIAGNOSIS — Z6831 Body mass index (BMI) 31.0-31.9, adult: Secondary | ICD-10-CM | POA: Insufficient documentation

## 2014-12-28 DIAGNOSIS — D649 Anemia, unspecified: Secondary | ICD-10-CM | POA: Diagnosis present

## 2014-12-28 DIAGNOSIS — Z79899 Other long term (current) drug therapy: Secondary | ICD-10-CM | POA: Diagnosis not present

## 2014-12-28 DIAGNOSIS — D631 Anemia in chronic kidney disease: Secondary | ICD-10-CM | POA: Diagnosis not present

## 2014-12-28 DIAGNOSIS — N186 End stage renal disease: Secondary | ICD-10-CM | POA: Insufficient documentation

## 2014-12-28 DIAGNOSIS — E1122 Type 2 diabetes mellitus with diabetic chronic kidney disease: Secondary | ICD-10-CM | POA: Insufficient documentation

## 2014-12-28 DIAGNOSIS — R079 Chest pain, unspecified: Secondary | ICD-10-CM | POA: Insufficient documentation

## 2014-12-28 DIAGNOSIS — Z7982 Long term (current) use of aspirin: Secondary | ICD-10-CM | POA: Insufficient documentation

## 2014-12-28 DIAGNOSIS — I959 Hypotension, unspecified: Secondary | ICD-10-CM | POA: Diagnosis not present

## 2014-12-28 DIAGNOSIS — Z8546 Personal history of malignant neoplasm of prostate: Secondary | ICD-10-CM | POA: Insufficient documentation

## 2014-12-28 DIAGNOSIS — R571 Hypovolemic shock: Secondary | ICD-10-CM | POA: Diagnosis not present

## 2014-12-28 DIAGNOSIS — Z992 Dependence on renal dialysis: Secondary | ICD-10-CM | POA: Insufficient documentation

## 2014-12-28 DIAGNOSIS — R197 Diarrhea, unspecified: Secondary | ICD-10-CM | POA: Diagnosis not present

## 2014-12-28 DIAGNOSIS — C61 Malignant neoplasm of prostate: Secondary | ICD-10-CM

## 2014-12-28 DIAGNOSIS — N2581 Secondary hyperparathyroidism of renal origin: Secondary | ICD-10-CM | POA: Insufficient documentation

## 2014-12-28 DIAGNOSIS — I639 Cerebral infarction, unspecified: Secondary | ICD-10-CM

## 2014-12-28 DIAGNOSIS — E1129 Type 2 diabetes mellitus with other diabetic kidney complication: Secondary | ICD-10-CM | POA: Diagnosis present

## 2014-12-28 LAB — I-STAT VENOUS BLOOD GAS, ED
ACID-BASE DEFICIT: 8 mmol/L — AB (ref 0.0–2.0)
BICARBONATE: 20 meq/L (ref 20.0–24.0)
O2 SAT: 69 %
TCO2: 21 mmol/L (ref 0–100)
pCO2, Ven: 48.1 mmHg (ref 45.0–50.0)
pH, Ven: 7.226 — ABNORMAL LOW (ref 7.250–7.300)
pO2, Ven: 43 mmHg (ref 30.0–45.0)

## 2014-12-28 LAB — CBC
HEMATOCRIT: 36.1 % — AB (ref 39.0–52.0)
HEMATOCRIT: 32.2 % — AB (ref 39.0–52.0)
HEMOGLOBIN: 11.7 g/dL — AB (ref 13.0–17.0)
Hemoglobin: 10.5 g/dL — ABNORMAL LOW (ref 13.0–17.0)
MCH: 29.9 pg (ref 26.0–34.0)
MCH: 30.2 pg (ref 26.0–34.0)
MCHC: 32.4 g/dL (ref 30.0–36.0)
MCHC: 32.6 g/dL (ref 30.0–36.0)
MCV: 92.3 fL (ref 78.0–100.0)
MCV: 92.5 fL (ref 78.0–100.0)
Platelets: 313 10*3/uL (ref 150–400)
Platelets: 242 10*3/uL (ref 150–400)
RBC: 3.91 MIL/uL — ABNORMAL LOW (ref 4.22–5.81)
RBC: 3.48 MIL/uL — ABNORMAL LOW (ref 4.22–5.81)
RDW: 16.5 % — ABNORMAL HIGH (ref 11.5–15.5)
RDW: 16.7 % — AB (ref 11.5–15.5)
WBC: 8.7 10*3/uL (ref 4.0–10.5)
WBC: 10.3 10*3/uL (ref 4.0–10.5)

## 2014-12-28 LAB — I-STAT CHEM 8, ED
BUN: 91 mg/dL — AB (ref 6–20)
CHLORIDE: 103 mmol/L (ref 101–111)
CREATININE: 16.9 mg/dL — AB (ref 0.61–1.24)
Calcium, Ion: 1.03 mmol/L — ABNORMAL LOW (ref 1.13–1.30)
Glucose, Bld: 172 mg/dL — ABNORMAL HIGH (ref 65–99)
HEMATOCRIT: 40 % (ref 39.0–52.0)
Hemoglobin: 13.6 g/dL (ref 13.0–17.0)
Potassium: 3.8 mmol/L (ref 3.5–5.1)
SODIUM: 143 mmol/L (ref 135–145)
TCO2: 18 mmol/L (ref 0–100)

## 2014-12-28 LAB — GLUCOSE, CAPILLARY: GLUCOSE-CAPILLARY: 65 mg/dL (ref 65–99)

## 2014-12-28 LAB — MRSA PCR SCREENING: MRSA by PCR: NEGATIVE

## 2014-12-28 LAB — I-STAT TROPONIN, ED: TROPONIN I, POC: 0.04 ng/mL (ref 0.00–0.08)

## 2014-12-28 LAB — COMPREHENSIVE METABOLIC PANEL
ALT: 15 U/L — AB (ref 17–63)
AST: 16 U/L (ref 15–41)
Albumin: 3.7 g/dL (ref 3.5–5.0)
Alkaline Phosphatase: 88 U/L (ref 38–126)
Anion gap: 24 — ABNORMAL HIGH (ref 5–15)
BUN: 92 mg/dL — ABNORMAL HIGH (ref 6–20)
CHLORIDE: 98 mmol/L — AB (ref 101–111)
CO2: 18 mmol/L — AB (ref 22–32)
CREATININE: 17.5 mg/dL — AB (ref 0.61–1.24)
Calcium: 9.1 mg/dL (ref 8.9–10.3)
GFR, EST AFRICAN AMERICAN: 3 mL/min — AB (ref >60)
GFR, EST NON AFRICAN AMERICAN: 2 mL/min — AB (ref >60)
Glucose, Bld: 181 mg/dL — ABNORMAL HIGH (ref 65–99)
POTASSIUM: 3.9 mmol/L (ref 3.5–5.1)
SODIUM: 140 mmol/L (ref 135–145)
Total Bilirubin: 0.6 mg/dL (ref 0.3–1.2)
Total Protein: 7.2 g/dL (ref 6.5–8.1)

## 2014-12-28 LAB — PROTIME-INR
INR: 1.25 (ref 0.00–1.49)
Prothrombin Time: 15.9 seconds — ABNORMAL HIGH (ref 11.6–15.2)

## 2014-12-28 LAB — I-STAT CG4 LACTIC ACID, ED: Lactic Acid, Venous: 3.95 mmol/L (ref 0.5–2.0)

## 2014-12-28 LAB — TROPONIN I: TROPONIN I: 0.05 ng/mL — AB (ref <0.031)

## 2014-12-28 MED ORDER — DIPHENOXYLATE-ATROPINE 2.5-0.025 MG PO TABS: 1.0000 | ORAL_TABLET | Freq: Four times a day (QID) | ORAL | Status: DC

## 2014-12-28 MED ORDER — ENOXAPARIN SODIUM 30 MG/0.3ML ~~LOC~~ SOLN
30.0000 mg | SUBCUTANEOUS | Status: DC
  Administered 2014-12-28 – 2014-12-29 (×2): 30 mg via SUBCUTANEOUS
  Filled 2014-12-28 (×2): qty 0.3

## 2014-12-28 MED ORDER — DIPHENHYDRAMINE HCL 25 MG PO TABS
25.0000 mg | ORAL_TABLET | Freq: Every day | ORAL | Status: DC | PRN
  Filled 2014-12-28: qty 1

## 2014-12-28 MED ORDER — SODIUM CHLORIDE 0.9 % IV BOLUS (SEPSIS)
1000.0000 mL | Freq: Once | INTRAVENOUS | Status: AC
  Administered 2014-12-28: 1000 mL via INTRAVENOUS

## 2014-12-28 MED ORDER — ASPIRIN 300 MG RE SUPP
300.0000 mg | Freq: Once | RECTAL | Status: AC
  Administered 2014-12-28: 300 mg via RECTAL
  Filled 2014-12-28: qty 1

## 2014-12-28 MED ORDER — VANCOMYCIN HCL 10 G IV SOLR
1500.0000 mg | Freq: Once | INTRAVENOUS | Status: AC
  Administered 2014-12-28: 1500 mg via INTRAVENOUS
  Filled 2014-12-28: qty 1500

## 2014-12-28 MED ORDER — SODIUM CHLORIDE 0.9 % IV SOLN: INTRAVENOUS | Status: DC

## 2014-12-28 MED ORDER — ENSURE ENLIVE PO LIQD
237.0000 mL | Freq: Two times a day (BID) | ORAL | Status: DC
  Administered 2014-12-29 – 2014-12-30 (×3): 237 mL via ORAL

## 2014-12-28 MED ORDER — PIPERACILLIN-TAZOBACTAM 3.375 G IVPB 30 MIN
3.3750 g | Freq: Once | INTRAVENOUS | Status: AC
  Administered 2014-12-28: 3.375 g via INTRAVENOUS
  Filled 2014-12-28: qty 50

## 2014-12-28 MED ORDER — DIPHENOXYLATE-ATROPINE 2.5-0.025 MG PO TABS
1.0000 | ORAL_TABLET | Freq: Four times a day (QID) | ORAL | Status: DC | PRN
  Administered 2014-12-28: 1 via ORAL
  Filled 2014-12-28: qty 1

## 2014-12-28 MED ORDER — CALCITRIOL 0.25 MCG PO CAPS
0.2500 ug | ORAL_CAPSULE | ORAL | Status: DC
  Administered 2014-12-28: 0.25 ug via ORAL
  Filled 2014-12-28: qty 1

## 2014-12-28 MED ORDER — ASPIRIN 325 MG PO TABS: 325.0000 mg | ORAL_TABLET | Freq: Once | ORAL | Status: DC

## 2014-12-28 MED ORDER — SEVELAMER CARBONATE 800 MG PO TABS
2400.0000 mg | ORAL_TABLET | Freq: Three times a day (TID) | ORAL | Status: DC
  Administered 2014-12-29 – 2014-12-30 (×2): 2400 mg via ORAL
  Filled 2014-12-28 (×2): qty 3

## 2014-12-28 MED ORDER — ASPIRIN EC 325 MG PO TBEC
325.0000 mg | DELAYED_RELEASE_TABLET | Freq: Every day | ORAL | Status: DC
  Administered 2014-12-28 – 2014-12-30 (×3): 325 mg via ORAL
  Filled 2014-12-28 (×3): qty 1

## 2014-12-28 MED ORDER — SODIUM CHLORIDE 0.9 % IV SOLN
1.0000 g | Freq: Once | INTRAVENOUS | Status: AC
  Administered 2014-12-28: 1 g via INTRAVENOUS
  Filled 2014-12-28: qty 10

## 2014-12-28 MED ORDER — SEVELAMER CARBONATE 800 MG PO TABS: 1600.0000 mg | ORAL_TABLET | Freq: Two times a day (BID) | ORAL | Status: DC | PRN

## 2014-12-28 MED ORDER — SODIUM CHLORIDE 0.9 % IV SOLN
Freq: Once | INTRAVENOUS | Status: AC
  Administered 2014-12-28: 22:00:00 via INTRAVENOUS

## 2014-12-28 MED ORDER — ALLOPURINOL 100 MG PO TABS
100.0000 mg | ORAL_TABLET | Freq: Every day | ORAL | Status: DC
  Administered 2014-12-28 – 2014-12-30 (×3): 100 mg via ORAL
  Filled 2014-12-28 (×3): qty 1

## 2014-12-28 MED ORDER — CINACALCET HCL 30 MG PO TABS
30.0000 mg | ORAL_TABLET | Freq: Every day | ORAL | Status: DC
  Administered 2014-12-29: 30 mg via ORAL
  Filled 2014-12-28: qty 1

## 2014-12-28 NOTE — ED Notes (Signed)
Pt did home dialysis this morning after finishing whole treatment pt started feeling very lightheaded and weak. Daughter checked BP in 60S. Pt started having left sided chest pain in route to ED. Pt has manual BP of 60/59 on arrival. Pt is alert and ox4. Pt is very diaphoretic and anxious. Dr. Colin Rhein at bedside.

## 2014-12-28 NOTE — H&P (Signed)
[facility] History and Physical  Sean Best [id number] DOB: 05-01-1933 DOA: 12/28/2014  Referring physician: ed PCP: [name], MD  Specialists: nephrology to see in am   81 y/o ? ESRD Prior CVa w/o deficit Htn Hld Prostate Ca Gout ty ii DM on insulin Dialysis Incontinence to stool   patient has been on and off hemodialysis and recently transitioned back from HD 2 peritoneal dialysis on Tuesday October 25 He had stopped doing his peritoneal dialysis previously because of an infected catheter which was replaced on September 17 and gone through all the classes. He started having PD again and was using the 2.5 concentration of 5 L bags and every night had been doing 3 5 L bags Because it was felt that his dry weight was not at goal which is about 80-81 kg according to the daughter, it was felt that they would challenge him with a different concentration of the PD fluid and the solution concentration was changed 4.25%. He underwent peritoneal dialysis overnight and 9 PM and the daughter notes that the starting blood pressure is 90/68.  His usual blood pressure is in the 160 range and that was the other reason why the daughter states that they were trying to take off more fluid in an effort to challenge his dry weight and bring down his blood pressure He awoke the morning of 10/28 and felt fair but the daughter noted after she called him admit date that he was not sounding like himself She came home and found him on the floor and he was having some chest pain so he came to the emergency room  No prior fever, no current profuse diarrhea although he has this is a chronic problem, No vomiting No cough  Norash Noabdominalpain Nounilateralweakness Noheadache Noblurredvision Nodoublevision No dizziness but could not stand when EMS came to his home  Family history-mother father had hypertension diabetes strokes  No known drug allergies  Finished ninth grade Never  smoker never drinker Has 2 children and lives in [place]     On admit hb 11.7 VBG ph 7.22 Plt 313 Lactic acid 3.9 Bun/Creat 92/17.5, AG 24 Bicarb 18 INr 1.25  CXr no acute anomaly EKG = sinus rhythm, slight ST-T wave depression. 3V4   Past Medical History  Diagnosis Date  . Stroke ([place])   . Claudication ([place])   . Diabetes mellitus   . ESRD on peritoneal dialysis [facility])     Started dialysis around April 2015 per son.  Has been doing peritoneal dialysis at home.     Marland Kitchen Hyperlipidemia   . Cancer [facility])     prostate cancer  . Gout   . Overweight(278.02)   . Arthritis   . Chronic total occlusion of artery of the extremities (HCC)     pt not aware of this  . Umbilical hernia   . Anemia   . Shingles   . Hypertension    Past Surgical History  Procedure Laterality Date  . Prostate surgery      prostate seed placement  . Av fistula placement, radiocephalic  [id number]    Left arm  . Eye surgery Left     cataract surgery  . Eye surgery Left     for eye injury  . Capd insertion N/A 03/06/2013    Procedure: LAPAROSCOPIC INSERTION CONTINUOUS AMBULATORY PERITONEAL DIALYSIS  (CAPD) CATHETER;  Surgeon: Edward Jolly, MD;  Location: [facility];  Service: General;  Laterality: N/A;  . Umbilical hernia repair N/A 03/06/2013  Procedure: HERNIA REPAIR UMBILICAL WITH MESH;  Surgeon: Edward Jolly, MD;  Location: [facility] OR;  Service: General;  Laterality: N/A;   Social History:  Social History   Social History Narrative    Allergies  Allergen Reactions  . Cardura [Doxazosin Mesylate] Other (See Comments)    Hallucinations    Family History  Problem Relation Age of Onset  . Cancer Mother   . Heart disease Mother   . Cancer Father     Prior to Admission medications   Medication Sig Start Date End Date Taking? Authorizing Provider  acetaminophen (TYLENOL) 500 MG tablet Take 1,000 mg by mouth daily as needed for headache.   Yes Historical Provider, MD  allopurinol  (ZYLOPRIM) 100 MG tablet Take 100 mg by mouth daily.     Yes Historical Provider, MD  aspirin EC 325 MG tablet Take 325 mg by mouth daily.   Yes Historical Provider, MD  atorvastatin (LIPITOR) 40 MG tablet Take 40 mg by mouth daily.     Yes Historical Provider, MD  calcitRIOL (ROCALTROL) 0.25 MCG capsule Take 0.25 mcg by mouth. Take 0.25 mcg on Mon, Wed, and Friday 03/13/14  Yes Historical Provider, MD  diphenhydrAMINE (BENADRYL) 25 MG tablet Take 25 mg by mouth daily as needed for itching.   Yes Historical Provider, MD  diphenoxylate-atropine (LOMOTIL) 2.5-0.025 MG per tablet TAKE 1 TABLET BY MOUTH EVERY 4 TO 6 HOURS AS NEEDED 10/09/14  Yes Jessica D Zehr, PA-C  furosemide (LASIX) 80 MG tablet Take 80 mg by mouth 2 (two) times daily. 05/07/14  Yes Historical Provider, MD  hydrALAZINE (APRESOLINE) 100 MG tablet Take 100 mg by mouth 3 (three) times daily. 05/09/14  Yes Historical Provider, MD  lisinopril (PRINIVIL,ZESTRIL) 10 MG tablet Take 10 mg by mouth daily. 05/15/14  Yes Historical Provider, MD  metoprolol (LOPRESSOR) 100 MG tablet Take 100 mg by mouth 2 (two) times daily.   Yes Historical Provider, MD  multivitamin (RENA-VIT) TABS tablet Take 1 tablet by mouth daily.   Yes Historical Provider, MD  SENSIPAR 30 MG tablet Take 30 mg by mouth daily. 12/12/14  Yes Historical Provider, MD  sevelamer carbonate (RENVELA) 800 MG tablet Take 1,600-2,400 mg by mouth See admin instructions. Take 3 tablets (2400 mg) by mouth 3 times daily with meals and 2 tablets (1600 mg) twice daily with snacks   Yes Historical Provider, MD  ACCU-CHEK COMPACT STRIPS test strip  05/20/10   Historical Provider, MD   Physical Exam: Filed Vitals:   12/28/14 1700 12/28/14 1715 12/28/14 1730 12/28/14 1800  BP: 117/52 115/58 124/64 102/64  Pulse: 90 93 95 101  Temp:      TempSrc:      Resp: 18 21 21 19   Height:      Weight:      SpO2: 96% 98% 98% 98%    eomi NCAT Pleasant oriented to time place and person Pupils are  reactive, temperature is midline, smile symmetrical, Chest clinically clear, Neck veins flat, Mild sinus tachycardia no murmur rub or gallop Abdomen soft nontender nondistended midline scar peritoneal dialysis access covered Slight suprapubic swelling but no cough impulse adjustment of hernia Power 5/5, reflexes deferred   Labs on Admission:  Basic Metabolic Panel:  Recent Labs Lab 12/28/14 1509 12/28/14 1512  NA 143 140  K 3.8 3.9  CL 103 98*  CO2  --  18*  GLUCOSE 172* 181*  BUN 91* 92*  CREATININE 16.90* 17.50*  CALCIUM  --  9.1  Liver Function Tests:  Recent Labs Lab 12/28/14 1512  AST 16  ALT 15*  ALKPHOS 88  BILITOT 0.6  PROT 7.2  ALBUMIN 3.7   No results for input(s): LIPASE, AMYLASE in the last 168 hours. No results for input(s): AMMONIA in the last 168 hours. CBC:  Recent Labs Lab 12/28/14 1509 12/28/14 1512  WBC  --  8.7  HGB 13.6 11.7*  HCT 40.0 36.1*  MCV  --  92.3  PLT  --  313   Cardiac Enzymes:  Recent Labs Lab 12/28/14 1512  TROPONINI 0.05*    BNP (last 3 results) No results for input(s): BNP in the last 8760 hours.  ProBNP (last 3 results) No results for input(s): PROBNP in the last 8760 hours.  CBG: No results for input(s): GLUCAP in the last 168 hours.  Radiological Exams on Admission: Dg Chest Port 1 View  12/28/2014  CLINICAL DATA:  Weakness, hypotension, stroke, CPR, diabetes mellitus, end- stage renal disease EXAM: PORTABLE CHEST 1 VIEW COMPARISON:  Portable exam 1510 hours compared to 12/09/2013 FINDINGS: External pacing leads. Enlargement of cardiac silhouette. Mediastinal contours and pulmonary vascularity normal for technique. Lungs clear. No infiltrate, pleural effusion or pneumothorax. No definite fractures identified. IMPRESSION: No acute abnormalities. Electronically Signed   By: Lavonia Dana M.D.   On: 12/28/2014 15:23     Assessment/Plan Principal Problem:   Hypovolemic shock (HCC) Likely as a result of  increased tonicity of dialysate fluid causing volume depletion and hypotension Admitted to step down IV saline 100 cc per hour for 10 hours then stop and reevaluate need for IV fluid May have a regular diet DDX could be septic shock however it is unlikely given the absence of fever and other issues We will get a pro-calcitonin to determine if there is further need for vancomycin and Zosyn and I will only give him 1 dose as the emergency room is given him  We will repeat CBC plus differential in a.m. His hypothermia could also be explained by dysautonomia  Chest pain Patient had this at the time of trying to move around EKG shows-subtle changes so we will cycle troponins x-ray We'll repeat EKG in the morning  Hypertension With hypovolemic shock   usual blood pressures range in the 160 range  We will attempt more aggressive blood pressure control once he is normotensive and hence have held off on hydralazine 100 3 times a day, lisinopril 10 OD, metoprolol 100 twice a day, Lasix 80 twice a day     Anemia Of renal disease Continue multivitamins and supplementation Does not appear to have need for erythropoietin as the hemoglobin is 11    DM (diabetes mellitus), type 2 with renal complications, diet controlled Not currently on any home meds Monitor  Secondary hyperparathyroidism Continue calcitriol 0.25, Sensipar 30 daily, Renvela 800 as per home instructions  Gout Continue allopurinol 100 daily may need to be renally dosed    ESRD on peritoneal dialysis [facility]) See above discussion Nephrology to see patient in a.m. and adjust    Stroke [facility]) Continue aspirin 325 daily      Prostate CA [facility]  outpatient surveillance   Chest pain  Time spent:  65 minutes discuss with daughter in detail at bedside Stepdown hobs likely discharge in 24-48 hours  Verlon Au [facility] Pager [phone]  If 7PM-7AM, please contact night-coverage [email or web address] Password  [facility] 12/28/2014, 6:19 PM

## 2014-12-28 NOTE — Consult Note (Addendum)
Name: Sean Best MRN: [id number] DOB: [birth year]    ADMISSION DATE:  12/28/2014 CONSULTATION DATE:  12/28/2014  REFERRING MD :  Dr. Verlon Au  CHIEF COMPLAINT:  Hypotension  HISTORY OF PRESENT ILLNESS:   50M with ESRD on PD usually 3 nights per week.  He states that over the last few weeks, the dialysis center has lowered his "dry weight" from 90kg to the low 80s.  He states that since this he has noticed increasing fatigue, weakness and malaise.  He reports that several times after dialysis he can not stand due to these symtpoms as well as dizziness.   Last night he completed a PD session and he went to stand and felt dizzy and weak, he lowered himself to the ground.  His daughter came home and found him trying to stand up, she helped him up and he subsequently fell into a reclining chair.  His daughter found him to be cold and diaphoretic.  In the ED he was profoundly hypotensive with chest pain and dizziness that resolved with the initiation of fluid.  The patient feels thirsty.  PAST MEDICAL HISTORY :   has a past medical history of Stroke [facility]); Claudication [facility]); Diabetes mellitus; ESRD on peritoneal dialysis ([place]); Hyperlipidemia; Cancer ([place]); Gout; Overweight(278.02); Arthritis; Chronic total occlusion of artery of the extremities ([place]); Umbilical hernia; Anemia; Shingles; and Hypertension.  has past surgical history that includes Prostate surgery; AV fistula placement, radiocephalic (123456); Eye surgery (Left); Eye surgery (Left); CAPD insertion (N/A, [id number]); and Umbilical hernia repair (N/A, 03/06/2013). Prior to Admission medications   Medication Sig Start Date End Date Taking? Authorizing Provider  acetaminophen (TYLENOL) 500 MG tablet Take 1,000 mg by mouth daily as needed for headache.   Yes Historical Provider, MD  allopurinol (ZYLOPRIM) 100 MG tablet Take 100 mg by mouth daily.     Yes Historical Provider, MD  aspirin EC 325 MG tablet Take 325 mg by mouth daily.    Yes Historical Provider, MD  atorvastatin (LIPITOR) 40 MG tablet Take 40 mg by mouth daily.     Yes Historical Provider, MD  calcitRIOL (ROCALTROL) 0.25 MCG capsule Take 0.25 mcg by mouth. Take 0.25 mcg on Mon, Wed, and Friday 03/13/14  Yes Historical Provider, MD  diphenhydrAMINE (BENADRYL) 25 MG tablet Take 25 mg by mouth daily as needed for itching.   Yes Historical Provider, MD  diphenoxylate-atropine (LOMOTIL) 2.5-0.025 MG per tablet TAKE 1 TABLET BY MOUTH EVERY 4 TO 6 HOURS AS NEEDED 10/09/14  Yes Jessica D Zehr, PA-C  furosemide (LASIX) 80 MG tablet Take 80 mg by mouth 2 (two) times daily. 05/07/14  Yes Historical Provider, MD  hydrALAZINE (APRESOLINE) 100 MG tablet Take 100 mg by mouth 3 (three) times daily. 05/09/14  Yes Historical Provider, MD  lisinopril (PRINIVIL,ZESTRIL) 10 MG tablet Take 10 mg by mouth daily. 05/15/14  Yes Historical Provider, MD  metoprolol (LOPRESSOR) 100 MG tablet Take 100 mg by mouth 2 (two) times daily.   Yes Historical Provider, MD  multivitamin (RENA-VIT) TABS tablet Take 1 tablet by mouth daily.   Yes Historical Provider, MD  SENSIPAR 30 MG tablet Take 30 mg by mouth daily. 12/12/14  Yes Historical Provider, MD  sevelamer carbonate (RENVELA) 800 MG tablet Take 1,600-2,400 mg by mouth See admin instructions. Take 3 tablets (2400 mg) by mouth 3 times daily with meals and 2 tablets (1600 mg) twice daily with snacks   Yes Historical Provider, MD  ACCU-CHEK COMPACT STRIPS test strip  05/20/10  Historical Provider, MD   Allergies  Allergen Reactions  . Cardura [Doxazosin Mesylate] Other (See Comments)    Hallucinations    FAMILY HISTORY:  family history includes Cancer in his father and mother; Heart disease in his mother. SOCIAL HISTORY:  reports that he has never smoked. He has never used smokeless tobacco. He reports that he does not drink alcohol or use illicit drugs.  REVIEW OF SYSTEMS:   Constitutional: Negative for fever, chills,  + weight loss,  malaise/fatigue and diaphoresis.  HENT: Negative for hearing loss, ear pain, nosebleeds, congestion, sore throat, neck pain, tinnitus and ear discharge.   Eyes: Negative for blurred vision, double vision, photophobia, pain, discharge and redness.  Respiratory: Negative for cough, hemoptysis, sputum production, shortness of breath, wheezing and stridor.   Cardiovascular:  +chest pain, palpitations, orthopnea,  Gastrointestinal: Negative for heartburn, nausea, vomiting, abdominal pain, +diarrhea, No constipation, blood in stool and melena.  Genitourinary: Negative for dysuria, urgency, frequency, hematuria and flank pain.  Musculoskeletal: Negative for myalgias, back pain, joint pain and falls.  Skin: Negative for itching and rash.  Neurological: Negative for dizziness, tingling, tremors, sensory change, speech change, focal weakness, seizures, loss of consciousness, weakness and headaches.  Endo/Heme/Allergies: Negative for environmental allergies and polydipsia. Does not bruise/bleed easily.  SUBJECTIVE:   VITAL SIGNS: Temp:  [96.4 F (35.8 C)-97.1 F (36.2 C)] 96.4 F (35.8 C) (10/28 1522) Pulse Rate:  [66-102] 97 (10/28 1930) Resp:  [12-21] 17 (10/28 1900) BP: (68-130)/(49-69) 101/57 mmHg (10/28 1930) SpO2:  [95 %-98 %] 96 % (10/28 1930) Weight:  [86.183 kg (190 lb)] 86.183 kg (190 lb) (10/28 1456)  PHYSICAL EXAMINATION: General:  AAOx3, no acute distress Neuro:  CN II-XII intact HEENT:  PERRLA Cardiovascular:  RRR, [id number].  + holosystolic murmur across precordium.  + Orthostasis, + Straight leg raise Lungs:  CTA b/l no w/r/r Abdomen:  Soft, non tender, normal bowel sounds Musculoskeletal:   Normal bulk and tone Skin:   No obvious rashes Extremities: left anticubital A-V fistula with + thrill and bruit   Recent Labs Lab 12/28/14 1509 12/28/14 1512  NA 143 140  K 3.8 3.9  CL 103 98*  CO2  --  18*  BUN 91* 92*  CREATININE 16.90* 17.50*  GLUCOSE 172* 181*    Recent  Labs Lab 12/28/14 1509 12/28/14 1512  HGB 13.6 11.7*  HCT 40.0 36.1*  WBC  --  8.7  PLT  --  313   Dg Chest Port 1 View  12/28/2014  CLINICAL DATA:  Weakness, hypotension, stroke, CPR, diabetes mellitus, end- stage renal disease EXAM: PORTABLE CHEST 1 VIEW COMPARISON:  Portable exam 1510 hours compared to 12/09/2013 FINDINGS: External pacing leads. Enlargement of cardiac silhouette. Mediastinal contours and pulmonary vascularity normal for technique. Lungs clear. No infiltrate, pleural effusion or pneumothorax. No definite fractures identified. IMPRESSION: No acute abnormalities. Electronically Signed   By: Lavonia Dana M.D.   On: 12/28/2014 15:23    ASSESSMENT / PLAN:  81AAM admitted with symptomatic hypotension 2/2 hypovolemia in the setting of excess fluid removal during dialysis.  After 3L in the ED BP have improved significantly.  His clinical signs of perfusion are intact and his persistent lactate is likely the result of his renal failure and poor clearance.    Problem list: - Hypovolemic shock- now resolved - Lactatemia without current shock - ESRD on PD - Orthostatic hypotension - AGMA/NAGMA  Recommend: - admission to stepdown unit - Continue fluid resuscitation with LR or plasma lyte  given his metabolic acidosis - Hold all anti-hypertensives - trend lactate - increase patients goal weight to 90kg - consult nephrology for removal or residual PD fluid - with volume rescucitation - bed rest for now - telemetry - re-check orthostatics as needed, further resuscitation if persistently +orthostatics - Agree with early antibiotics - Blood cx - UA and culture - PD fluid culture - check procalcitonin  Thank you for this interesting consult. PCCM will signoff at this time.  Please do not hesitate to call with questions or re-consult if we can be of further assistance.  Total critical care time: 30 min  Critical care time was exclusive of separately billable procedures and  treating other patients.  Critical care was necessary to treat or prevent imminent or life-threatening deterioration.  Critical care was time spent personally by me on the following activities: development of treatment plan with patient and/or surrogate as well as nursing, discussions with consultants, evaluation of patient's response to treatment, examination of patient, obtaining history from patient or surrogate, ordering and performing treatments and interventions, ordering and review of laboratory studies, ordering and review of radiographic studies, pulse oximetry and re-evaluation of patient's condition.   [facility] [place] Pager: [phone]  12/28/2014, 7:54 PM

## 2014-12-28 NOTE — ED Provider Notes (Signed)
CSN: [id number]     Arrival date & time 12/28/14  1437 History   First MD Initiated Contact with Patient 12/28/14 1448     Chief Complaint  Patient presents with  . Weakness  . Hypotension     (Consider location/radiation/quality/duration/timing/severity/associated sxs/prior Treatment) Patient is a 81 y.o. male presenting with general illness.  Illness Location:  Generalized Quality:  Weakness Severity:  Severe Onset quality:  Gradual Duration:  5 hours Timing:  Constant Progression:  Unchanged Chronicity:  New Context:  ESRD on home dialysis, pain began after dialysis Relieved by:  Nothing Worsened by:  Nothing Associated symptoms: chest pain   Associated symptoms: no abdominal pain, no fever and no shortness of breath     Past Medical History  Diagnosis Date  . Stroke ([place])   . Claudication ([place])   . Diabetes mellitus   . ESRD on peritoneal dialysis [facility])     Started dialysis around April 2015 per son.  Has been doing peritoneal dialysis at home.     Marland Kitchen Hyperlipidemia   . Cancer [facility])     prostate cancer  . Gout   . Overweight(278.02)   . Arthritis   . Chronic total occlusion of artery of the extremities (HCC)     pt not aware of this  . Umbilical hernia   . Anemia   . Shingles   . Hypertension    Past Surgical History  Procedure Laterality Date  . Prostate surgery      prostate seed placement  . Av fistula placement, radiocephalic  [id number]    Left arm  . Eye surgery Left     cataract surgery  . Eye surgery Left     for eye injury  . Capd insertion N/A 03/06/2013    Procedure: LAPAROSCOPIC INSERTION CONTINUOUS AMBULATORY PERITONEAL DIALYSIS  (CAPD) CATHETER;  Surgeon: Edward Jolly, MD;  Location: [place];  Service: General;  Laterality: N/A;  . Umbilical hernia repair N/A 03/06/2013    Procedure: HERNIA REPAIR UMBILICAL WITH MESH;  Surgeon: Edward Jolly, MD;  Location: [facility] OR;  Service: General;  Laterality: N/A;   Family History  Problem  Relation Age of Onset  . Cancer Mother   . Heart disease Mother   . Cancer Father    Social History  Substance Use Topics  . Smoking status: Never Smoker   . Smokeless tobacco: Never Used  . Alcohol Use: No    Review of Systems  Constitutional: Negative for fever.  Respiratory: Negative for shortness of breath.   Cardiovascular: Positive for chest pain.  Gastrointestinal: Negative for abdominal pain.  All other systems reviewed and are negative.     Allergies  Cardura  Home Medications   Prior to Admission medications   Medication Sig Start Date End Date Taking? Authorizing Provider  acetaminophen (TYLENOL) 500 MG tablet Take 1,000 mg by mouth daily as needed for headache.   Yes Historical Provider, MD  allopurinol (ZYLOPRIM) 100 MG tablet Take 100 mg by mouth daily.     Yes Historical Provider, MD  aspirin EC 325 MG tablet Take 325 mg by mouth daily.   Yes Historical Provider, MD  atorvastatin (LIPITOR) 40 MG tablet Take 40 mg by mouth daily.     Yes Historical Provider, MD  calcitRIOL (ROCALTROL) 0.25 MCG capsule Take 0.25 mcg by mouth. Take 0.25 mcg on Mon, Wed, and Friday 03/13/14  Yes Historical Provider, MD  diphenhydrAMINE (BENADRYL) 25 MG tablet Take 25 mg by mouth daily as  needed for itching.   Yes Historical Provider, MD  diphenoxylate-atropine (LOMOTIL) 2.5-0.025 MG per tablet TAKE 1 TABLET BY MOUTH EVERY 4 TO 6 HOURS AS NEEDED 10/09/14  Yes Jessica D Zehr, PA-C  furosemide (LASIX) 80 MG tablet Take 80 mg by mouth 2 (two) times daily. 05/07/14  Yes Historical Provider, MD  hydrALAZINE (APRESOLINE) 100 MG tablet Take 100 mg by mouth 3 (three) times daily. 05/09/14  Yes Historical Provider, MD  lisinopril (PRINIVIL,ZESTRIL) 10 MG tablet Take 10 mg by mouth daily. 05/15/14  Yes Historical Provider, MD  metoprolol (LOPRESSOR) 100 MG tablet Take 100 mg by mouth 2 (two) times daily.   Yes Historical Provider, MD  multivitamin (RENA-VIT) TABS tablet Take 1 tablet by mouth daily.    Yes Historical Provider, MD  SENSIPAR 30 MG tablet Take 30 mg by mouth daily. 12/12/14  Yes Historical Provider, MD  sevelamer carbonate (RENVELA) 800 MG tablet Take 1,600-2,400 mg by mouth See admin instructions. Take 3 tablets (2400 mg) by mouth 3 times daily with meals and 2 tablets (1600 mg) twice daily with snacks   Yes Historical Provider, MD  [place] test strip  05/20/10   Historical Provider, MD   BP 124/64 mmHg  Pulse 95  Temp(Src) 96.4 F (35.8 C) (Rectal)  Resp 21  Ht 5\' 5"  (1.651 m)  Wt 190 lb (86.183 kg)  BMI 31.62 kg/m2  SpO2 98% Physical Exam  Constitutional: He is oriented to person, place, and time. He appears well-developed and well-nourished.  HENT:  Head: Normocephalic and atraumatic.  Eyes: Conjunctivae and EOM are normal.  Neck: Normal range of motion. Neck supple.  Cardiovascular: Normal rate, regular rhythm and normal heart sounds.   Pulmonary/Chest: Effort normal and breath sounds normal. No respiratory distress.  Abdominal: He exhibits no distension. There is no tenderness. There is no rebound and no guarding.  Musculoskeletal: Normal range of motion.  Neurological: He is alert and oriented to person, place, and time.  Skin: Skin is warm and dry.  Vitals reviewed.   ED Course  Procedures (including critical care time) Labs Review Labs Reviewed  CBC - Abnormal; Notable for the following:    RBC 3.91 (*)    Hemoglobin 11.7 (*)    HCT 36.1 (*)    RDW 16.5 (*)    All other components within normal limits  COMPREHENSIVE METABOLIC PANEL - Abnormal; Notable for the following:    Chloride 98 (*)    CO2 18 (*)    Glucose, Bld 181 (*)    BUN 92 (*)    Creatinine, Ser 17.50 (*)    ALT 15 (*)    GFR calc non Af Amer 2 (*)    GFR calc Af Amer 3 (*)    Anion gap 24 (*)    All other components within normal limits  PROTIME-INR - Abnormal; Notable for the following:    Prothrombin Time 15.9 (*)    All other components within normal limits   TROPONIN I - Abnormal; Notable for the following:    Troponin I 0.05 (*)    All other components within normal limits  I-STAT CHEM 8, ED - Abnormal; Notable for the following:    BUN 91 (*)    Creatinine, Ser 16.90 (*)    Glucose, Bld 172 (*)    Calcium, Ion 1.03 (*)    All other components within normal limits  I-STAT CG4 LACTIC ACID, ED - Abnormal; Notable for the following:    Lactic  Acid, Venous 3.95 (*)    All other components within normal limits  I-STAT VENOUS BLOOD GAS, ED - Abnormal; Notable for the following:    pH, Ven 7.226 (*)    Acid-base deficit 8.0 (*)    All other components within normal limits  CULTURE, BLOOD (ROUTINE X 2)  CULTURE, BLOOD (ROUTINE X 2)  URINALYSIS, ROUTINE W REFLEX MICROSCOPIC (NOT AT [facility])  BLOOD GAS, VENOUS  I-STAT TROPOININ, ED    Imaging Review Dg Chest Port 1 View  12/28/2014  CLINICAL DATA:  Weakness, hypotension, stroke, CPR, diabetes mellitus, end- stage renal disease EXAM: PORTABLE CHEST 1 VIEW COMPARISON:  Portable exam 1510 hours compared to 12/09/2013 FINDINGS: External pacing leads. Enlargement of cardiac silhouette. Mediastinal contours and pulmonary vascularity normal for technique. Lungs clear. No infiltrate, pleural effusion or pneumothorax. No definite fractures identified. IMPRESSION: No acute abnormalities. Electronically Signed   By: Lavonia Dana M.D.   On: 12/28/2014 15:23   I have personally reviewed and evaluated these images and lab results as part of my medical decision-making.   EKG Interpretation   Date/Time:  Friday December 28 2014 14:43:29 EDT Ventricular Rate:  76 PR Interval:  143 QRS Duration: 88 QT Interval:  433 QTC Calculation: 487 R Axis:   47 Text Interpretation:  Pacemaker spikes or artifacts Sinus rhythm Abnormal  R-wave progression, late transition Borderline ST depression, diffuse  leads Minimal ST elevation, anterior leads Borderline prolonged QT  interval Confirmed by Debby Freiberg [phone]) on  12/28/2014 2:55:15 PM     CRITICAL CARE Performed by: Debby Freiberg   Total critical care time: 35 min minutes  Critical care time was exclusive of separately billable procedures and treating other patients.  Critical care was necessary to treat or prevent imminent or life-threatening deterioration.  Critical care was time spent personally by me on the following activities: development of treatment plan with patient and/or surrogate as well as nursing, discussions with consultants, evaluation of patient's response to treatment, examination of patient, obtaining history from patient or surrogate, ordering and performing treatments and interventions, ordering and review of laboratory studies, ordering and review of radiographic studies, pulse oximetry and re-evaluation of patient's condition.  MDM   Final diagnoses:  None    81 y.o. male with pertinent PMH of ESRD on home dialysis, prior CVA presents with generalized weakness, found to be hypotensive to 60 systolic PTA.  Physical exam with oriented pt.  Pt placed in trendelenburg and NS bolus started.  IV access x 2 obtained on arrival.  They are attempting to remove fluid to weight 81 kg from 86.  Wu as above.  BP improved after 2L NS bolus.  No respiratory distress.  Given vanc/zosyn, blood cultures drawn prior. Admitted to stepdown.    I have reviewed all laboratory and imaging studies if ordered as above  No diagnosis found.      Debby Freiberg, MD 12/28/14 [phone]

## 2014-12-28 NOTE — ED Notes (Signed)
Pt a/o x4. Following commands appropriately and speaking in complete sentences. Pt is pale and clammy with diaphoresis, BP up to 99/47 at this time with NS bolus running. Pt rectal temp 96.4, bear hugger applied at current time. Pt placed on zoll as well.

## 2014-12-28 NOTE — ED Notes (Signed)
pharmacy called to send Calcium Gluconate

## 2014-12-28 NOTE — ED Notes (Addendum)
Multiple attempts made to get blood cultures x2, one set able to be obtained but due to pt restricted extremity and having multiple fluids going in IV's in right arm, unable to obtain second set. Starting antibiotics to attempt to not delay antibiotic treatment. MD aware.

## 2014-12-28 NOTE — ED Notes (Signed)
Upon entering room, pt BP 90/47, MD Colin Rhein notified and verbally ordered to give 1L NS Bolus.

## 2014-12-29 DIAGNOSIS — N186 End stage renal disease: Secondary | ICD-10-CM | POA: Diagnosis not present

## 2014-12-29 DIAGNOSIS — R571 Hypovolemic shock: Secondary | ICD-10-CM | POA: Diagnosis not present

## 2014-12-29 DIAGNOSIS — E1122 Type 2 diabetes mellitus with diabetic chronic kidney disease: Secondary | ICD-10-CM | POA: Diagnosis not present

## 2014-12-29 DIAGNOSIS — I120 Hypertensive chronic kidney disease with stage 5 chronic kidney disease or end stage renal disease: Secondary | ICD-10-CM | POA: Diagnosis not present

## 2014-12-29 LAB — TROPONIN I
TROPONIN I: 0.08 ng/mL — AB (ref <0.031)
TROPONIN I: 0.07 ng/mL — AB (ref <0.031)
TROPONIN I: 0.07 ng/mL — AB (ref <0.031)

## 2014-12-29 LAB — URINE MICROSCOPIC-ADD ON

## 2014-12-29 LAB — CBC
HCT: 30.1 % — ABNORMAL LOW (ref 39.0–52.0)
HEMOGLOBIN: 9.8 g/dL — AB (ref 13.0–17.0)
MCH: 31 pg (ref 26.0–34.0)
MCHC: 32.6 g/dL (ref 30.0–36.0)
MCV: 95.3 fL (ref 78.0–100.0)
Platelets: 215 10*3/uL (ref 150–400)
RBC: 3.16 MIL/uL — ABNORMAL LOW (ref 4.22–5.81)
RDW: 16.7 % — ABNORMAL HIGH (ref 11.5–15.5)
WBC: 9.2 10*3/uL (ref 4.0–10.5)

## 2014-12-29 LAB — URINALYSIS, ROUTINE W REFLEX MICROSCOPIC
Bilirubin Urine: NEGATIVE
GLUCOSE, UA: NEGATIVE mg/dL
KETONES UR: NEGATIVE mg/dL
Nitrite: NEGATIVE
PH: 5 (ref 5.0–8.0)
PROTEIN: 100 mg/dL — AB
SPECIFIC GRAVITY, URINE: 1.02 (ref 1.005–1.030)
Urobilinogen, UA: 0.2 mg/dL (ref 0.0–1.0)

## 2014-12-29 LAB — COMPREHENSIVE METABOLIC PANEL
ALT: 12 U/L — AB (ref 17–63)
AST: 12 U/L — AB (ref 15–41)
Albumin: 2.9 g/dL — ABNORMAL LOW (ref 3.5–5.0)
Alkaline Phosphatase: 65 U/L (ref 38–126)
Anion gap: 17 — ABNORMAL HIGH (ref 5–15)
BUN: 88 mg/dL — ABNORMAL HIGH (ref 6–20)
CALCIUM: 8.1 mg/dL — AB (ref 8.9–10.3)
CO2: 16 mmol/L — ABNORMAL LOW (ref 22–32)
Chloride: 106 mmol/L (ref 101–111)
Creatinine, Ser: 16.37 mg/dL — ABNORMAL HIGH (ref 0.61–1.24)
GFR calc Af Amer: 3 mL/min — ABNORMAL LOW (ref >60)
GFR calc non Af Amer: 2 mL/min — ABNORMAL LOW (ref >60)
GLUCOSE: 74 mg/dL (ref 65–99)
Potassium: 4.4 mmol/L (ref 3.5–5.1)
SODIUM: 139 mmol/L (ref 135–145)
TOTAL PROTEIN: 5.7 g/dL — AB (ref 6.5–8.1)
Total Bilirubin: 0.7 mg/dL (ref 0.3–1.2)

## 2014-12-29 LAB — PROTIME-INR
INR: 1.39 (ref 0.00–1.49)
PROTHROMBIN TIME: 17.2 s — AB (ref 11.6–15.2)

## 2014-12-29 LAB — C DIFFICILE QUICK SCREEN W PCR REFLEX
C DIFFICLE (CDIFF) ANTIGEN: POSITIVE — AB
C Diff toxin: NEGATIVE

## 2014-12-29 LAB — GLUCOSE, CAPILLARY: Glucose-Capillary: 87 mg/dL (ref 65–99)

## 2014-12-29 MED ORDER — GENTAMICIN SULFATE 0.1 % EX CREA
1.0000 "application " | TOPICAL_CREAM | Freq: Every day | CUTANEOUS | Status: DC
  Filled 2014-12-29: qty 15

## 2014-12-29 MED ORDER — DELFLEX-LC/2.5% DEXTROSE 394 MOSM/L IP SOLN
INTRAPERITONEAL | Status: DC
  Administered 2014-12-29 (×2): 5000 mL via INTRAPERITONEAL

## 2014-12-29 MED ORDER — DELFLEX-LC/4.25% DEXTROSE 483 MOSM/L IP SOLN
INTRAPERITONEAL | Status: DC
  Administered 2014-12-29 (×2): 5000 mL via INTRAPERITONEAL

## 2014-12-29 MED ORDER — HEPARIN 1000 UNIT/ML FOR PERITONEAL DIALYSIS
2500.0000 [IU] | INTRAMUSCULAR | Status: DC | PRN
  Administered 2014-12-29 (×4): 2500 [IU] via INTRAPERITONEAL
  Filled 2014-12-29 (×8): qty 2.5

## 2014-12-29 NOTE — Progress Notes (Signed)
Initial Nutrition Assessment  DOCUMENTATION CODES:   Obesity unspecified  INTERVENTION:  Heart Healthy diet  Nursing to provide snack q hs    NUTRITION DIAGNOSIS:   Increased nutrient needs related to chronic illness as evidenced by estimated needs.  GOAL:   Patient will meet greater than or equal to 90% of their needs   MONITOR:   PO intake, Labs, Weight trends  REASON FOR ASSESSMENT:   Malnutrition Screening Tool    ASSESSMENT: Pt has hx of ESRD and is on PD 3 days a week.Pt complains that the dialysis center dry wt goal is too low and he is very weak unless he maintains his wt around 87 kg. 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  Pt goes on to says he usually eats well eggs and toast for breakfast sandwich at lunch and a protein and vegetables at night. He understands the importance of getting regular meals and adeuate protein in daily. Pt says his fluid goal 32 oz (948 ml).  He is followed by RD at dialysis center.  Labs:BUN 88, Cr. 16.3, phos.3.4 -WNL   Diet Order:  Diet Heart Room service appropriate?: Yes; Fluid consistency:: Thin  Skin:  Reviewed, no issues  Last BM:   10/28 loose stool 1 x today so far per pt. Reports  Loose stools 4x yesterday.  Height:   Ht Readings from Last 1 Encounters:  12/28/14 5\' 6"  (1.676 m)    Weight:   Wt Readings from Last 1 Encounters:  12/29/14 192 lb 3.9 oz (87.2 kg)    Ideal Body Weight:  64.5 kg  BMI:  Body mass index is 31.04 kg/(m^2).  Estimated Nutritional Needs:   Kcal:  1600-1800 kcal  Protein:  104-115 gr  Fluid:  1.3 liters daily adjust based on urine output, BP and cardiac status-ultrafiltration   EDUCATION NEEDS:   Education needs addressed  Colman Cater MS,RD,CSG,LDN Office: [phone] Pager: [phone]

## 2014-12-29 NOTE — Consult Note (Signed)
[facility] Renal Consultation Note    Indication for Consultation:  Management of ESRD/hemodialysis; anemia, hypertension/volume and secondary hyperparathyroidism  HPI: Sean Best is a 81 y.o. male with ESRD on PD, DM, HTN who recently had hernia repair with short term hemodialysis and just transitioned back to PD. The pt's daughter called home training on Friday 10/28 with report of his low BP and diminished responsiveness.    Notes from  10/26 show that he used all 4.25% bags due to being 8.4 kg above EDW.  He confirms that he was using all 4.25% solutions - daughter sets the machine up for him. When he was on HD in October his post weights were consistently 80.5 - 81 with pre HD BPs of 140 - 170 and post HD BPs in the low 100s (EDW 81).  Has been on 80 bid of lasix,  hydralazine 100 tid and MTP 100 bid, norvasc 10 and Dr. Joelyn Oms added lisinopril 10 on 10/18 during a PD visit on 10/18. Cr upon admission 16, BUN 80-90 and low bicarb suggestive of poor clearances/cathter function but during retraining period would not have been getting his fill 5 exchanges.day). Cr while on HD 6-9.  Pt tells me that when he was on HD even though he was getting to the EDW of 80.5 that he felt so poorly post HD that all he could do was "make it to the chair" and didn't fell like doing anything until the next day. He says that yesterday he was feeling that same way when he finished HD but just got worse - called his daughter who said he "wasn't talking right" and was brought to the ED  In the ED was found to have BP in the 60's. Was given fluids - appears he rec'd about 3 liters of fluid. The weights recorded are 86-87 kg and of note he never had his 2 liter last fill drained yesterday.  He had some minimal elevations in his troponins.  He had chest pain yesterday that has resolved. He was hypothermic on admission and was treated with vanco and zosyn. No procalcitonin done. Admission CXR clear  At the  present time all he complains of is some intermittent diarrhea that he has atributed to his phos binders (of note he is CDiff Ag+ toxin neg)   Past Medical History  Diagnosis Date  . Stroke ([place])   . Claudication ([place])   . Diabetes mellitus   . ESRD on peritoneal dialysis [facility])     Started dialysis around April 2015 per son.  Has been doing peritoneal dialysis at home.     Marland Kitchen Hyperlipidemia   . Cancer [facility])     prostate cancer  . Gout   . Overweight(278.02)   . Arthritis   . Chronic total occlusion of artery of the extremities (HCC)     pt not aware of this  . Umbilical hernia   . Anemia   . Shingles   . Hypertension    Past Surgical History  Procedure Laterality Date  . Prostate surgery      prostate seed placement  . Av fistula placement, radiocephalic  [id number]    Left arm  . Eye surgery Left     cataract surgery  . Eye surgery Left     for eye injury  . Capd insertion N/A 03/06/2013    Procedure: LAPAROSCOPIC INSERTION CONTINUOUS AMBULATORY PERITONEAL DIALYSIS  (CAPD) CATHETER;  Surgeon: Edward Jolly, MD;  Location: [place];  Service: General;  Laterality: N/A;  . Umbilical hernia repair N/A 03/06/2013    Procedure: HERNIA REPAIR UMBILICAL WITH MESH;  Surgeon: Edward Jolly, MD;  Location: [facility] OR;  Service: General;  Laterality: N/A;   Family History  Problem Relation Age of Onset  . Cancer Mother   . Heart disease Mother   . Cancer Father    Social History:  reports that he has never smoked. He has never used smokeless tobacco. He reports that he does not drink alcohol or use illicit drugs. Allergies  Allergen Reactions  . Cardura [Doxazosin Mesylate] Other (See Comments)    Hallucinations   Prior to Admission medications   Medication Sig Start Date End Date Taking? Authorizing Provider  acetaminophen (TYLENOL) 500 MG tablet Take 1,000 mg by mouth daily as needed for headache.   Yes Historical Provider, MD  allopurinol (ZYLOPRIM) 100 MG tablet Take 100  mg by mouth daily.     Yes Historical Provider, MD  aspirin EC 325 MG tablet Take 325 mg by mouth daily.   Yes Historical Provider, MD  atorvastatin (LIPITOR) 40 MG tablet Take 40 mg by mouth daily.     Yes Historical Provider, MD  calcitRIOL (ROCALTROL) 0.25 MCG capsule Take 0.25 mcg by mouth. Take 0.25 mcg on Mon, Wed, and Friday 03/13/14  Yes Historical Provider, MD  diphenhydrAMINE (BENADRYL) 25 MG tablet Take 25 mg by mouth daily as needed for itching.   Yes Historical Provider, MD  diphenoxylate-atropine (LOMOTIL) 2.5-0.025 MG per tablet TAKE 1 TABLET BY MOUTH EVERY 4 TO 6 HOURS AS NEEDED 10/09/14  Yes Jessica D Zehr, PA-C  furosemide (LASIX) 80 MG tablet Take 80 mg by mouth 2 (two) times daily. 05/07/14  Yes Historical Provider, MD  hydrALAZINE (APRESOLINE) 100 MG tablet Take 100 mg by mouth 3 (three) times daily. 05/09/14  Yes Historical Provider, MD  lisinopril (PRINIVIL,ZESTRIL) 10 MG tablet Take 10 mg by mouth daily. 05/15/14  Yes Historical Provider, MD  metoprolol (LOPRESSOR) 100 MG tablet Take 100 mg by mouth 2 (two) times daily.   Yes Historical Provider, MD  multivitamin (RENA-VIT) TABS tablet Take 1 tablet by mouth daily.   Yes Historical Provider, MD  SENSIPAR 30 MG tablet Take 30 mg by mouth daily. 12/12/14  Yes Historical Provider, MD  sevelamer carbonate (RENVELA) 800 MG tablet Take 1,600-2,400 mg by mouth See admin instructions. Take 3 tablets (2400 mg) by mouth 3 times daily with meals and 2 tablets (1600 mg) twice daily with snacks   Yes Historical Provider, MD  [place] test strip  05/20/10   Historical Provider, MD   Current Facility-Administered Medications  Medication Dose Route Frequency Provider Last Rate Last Dose  . allopurinol (ZYLOPRIM) tablet 100 mg  100 mg Oral Daily Nita Sells, MD   100 mg at 12/29/14 1025  . aspirin EC tablet 325 mg  325 mg Oral Daily Nita Sells, MD   325 mg at 12/29/14 1025  . diphenhydrAMINE (BENADRYL) tablet 25 mg   25 mg Oral Daily PRN Nita Sells, MD      . diphenoxylate-atropine (LOMOTIL) 2.5-0.025 MG per tablet 1 tablet  1 tablet Oral QID PRN Nita Sells, MD   1 tablet at 12/28/14 2144  . enoxaparin (LOVENOX) injection 30 mg  30 mg Subcutaneous Q24H Nita Sells, MD   30 mg at 12/28/14 2144  . feeding supplement (ENSURE ENLIVE) (ENSURE ENLIVE) liquid 237 mL  237 mL Oral BID BM Nita Sells, MD   237 mL at 12/29/14  1026  . sevelamer carbonate (RENVELA) tablet 1,600 mg  1,600 mg Oral BID PRN Nita Sells, MD      . sevelamer carbonate (RENVELA) tablet 2,400 mg  2,400 mg Oral TID WC Nita Sells, MD   2,400 mg at 12/29/14 0813  ROS: As per HPI otherwise negative.  Physical Exam: BP 131/70 mmHg  Pulse 93  Temp(Src) 97.9 F (36.6 C) (Oral)  Resp 16  Ht 5\' 6"  (1.676 m)  Wt 87.2 kg (192 lb 3.9 oz)  BMI 31.04 kg/m2  SpO2 99%    General: Well developed, well nourished, in no acute distress. Head: Normocephalic, atraumatic, sclera non-icteric, mucus membranes are moist Neck: Supple. JVD not elevated. Lungs: Clear bilaterally to auscultation without wheezes, rales, or rhonchi. Breathing is unlabored. Heart: Regular rhythm with normal  S1 S2. No murmurs, rubs, or gallops appreciated. Abdomen: Soft, non-tender, non-distended with normoactive bowel sounds. No rebound/guarding. No obvious abdominal masses. PD catheter with dressing in place.  M-S:  Strength and tone appear normal for age. Lower extremities: No LE edema Neuro: Alert and oriented X 3. Moves all extremities spontaneously. Psych:  Responds to questions appropriately with a normal affect. Dialysis Access: PD catheter and left AVF (w+ bruit)   Labs: Basic Metabolic Panel:  Recent Labs Lab 12/28/14 1509 12/28/14 1512 12/29/14 0256  NA 143 140 139  K 3.8 3.9 4.4  CL 103 98* 106  CO2  --  18* 16*  GLUCOSE 172* 181* 74  BUN 91* 92* 88*  CREATININE 16.90* 17.50* 16.37*  CALCIUM  --  9.1 8.1*      Recent Labs Lab 12/28/14 1512 12/29/14 0256  AST 16 12*  ALT 15* 12*  ALKPHOS 88 65  BILITOT 0.6 0.7  PROT 7.2 5.7*  ALBUMIN 3.7 2.9*     Recent Labs Lab 12/28/14 1512 12/28/14 2119 12/29/14 0256  WBC 8.7 10.3 9.2  HGB 11.7* 10.5* 9.8*  HCT 36.1* 32.2* 30.1*  MCV 92.3 92.5 95.3  PLT 313 242 215     Recent Labs Lab 12/28/14 1512 12/29/14 0945  TROPONINI 0.05* 0.08*     Recent Labs Lab 12/28/14 2157 12/28/14 2248  GLUCAP 65 87   Studies/Results: Dg Chest Port 1 View  12/28/2014  CLINICAL DATA:  Weakness, hypotension, stroke, CPR, diabetes mellitus, end- stage renal disease EXAM: PORTABLE CHEST 1 VIEW COMPARISON:  Portable exam 1510 hours compared to 12/09/2013 FINDINGS: External pacing leads. Enlargement of cardiac silhouette. Mediastinal contours and pulmonary vascularity normal for technique. Lungs clear. No infiltrate, pleural effusion or pneumothorax. No definite fractures identified. IMPRESSION: No acute abnormalities. Electronically Signed   By: Lavonia Dana M.D.   On: 12/28/2014 15:23     Dialysis Orders: Center: [facility]  5 exchanges/24 hours. 2.3 liters X 4 exchanges, 2 liters last fill which he drains in the middle of the day.   Most recent EDW 81 kg and has been using all 4.25% dialysate Last outpt labs: Hb 10/17 11 TSat 32%  Rec'd Mircera 50 mcg on 10/6. Last PTH 823 on 10/17  Assessment/Plan: 1. Hypotension/AMS - post completion of PD yesterday. Documented hypotension. Responded to IVF. Today after 3 liters of fluid his lungs are clear, no JVD, feels much better.  I suspect his dry weight is/has been a little too low based on his history. Current weight of 87 kg includes 2 liters that were not drained from the abd yesterday - so his appropriate EDW MAY be closer to 85 kg (rather than 81). For  his PD tonight will use half/half 2.5 and 4.25% dialysate (rather than all 4.24) and assess UF and blood pressure response to this. I DOUBT sepsis  (rec'd ATB's - cultures pending - hypothermia has resolved - no procalcitonin every drawn that I can tell. 2. Chest pain - minimally elevated troponins. For repeat ECG 3.  ESRD -  On PD - see above discussion 4.  Hypertension/volume  - Has been on 80 bid of lasix,  hydralazine 100 tid and MTP 100 bid, norvasc 10 and Dr. Joelyn Oms added lisinopril 10 on 10/18 during a PD visit on 10/18. Outpt BP's have tended to be quite elevated. Meds can be added back as needed 5.  Anemia  - on Mircera as outpt last dosed on 10/6 with Hb of 11 on 10/17. Hb drop to 9.8 in part dilutional d/t administration of 3 L of fluid.  Recheck tomorrow. 6.  Metabolic bone disease -  On renvela, phoslo and sensipar.  He has recently "self adjusted" his binders when he gets diarrhea and has thought renvela related.  However - says he has been on renvela for over a year and the intermittent diarrhea is a more recent development.  I note he is CDiff toxin neg/Ag+. I don't know if this is significant.  Hold all binders and sensipar for now and if diarrhea in the hospital then NOT likely binder related. If diarrhea off binders would consider treatment for the CDiff despite neg toxin.   Jamal Maes, MD [facility] [phone] Pager 12/29/2014, 1:18 PM

## 2014-12-29 NOTE — Progress Notes (Signed)
Sean Best [id number] DOB: [birth year] DOA: 12/28/2014 PCP: [name], MD  Brief narrative: 82 y/o ? ESRD Prior CVa w/o deficit Htn Hld Prostate Ca Gout ty ii DM on insulin Dialysis Incontinence to stool Admitted from home after recent re-start on PD 10/25 which resulted in volume depletion as the EDW was being adjusted by manipulating the Dialysate fluid concentration  Initially hypotensive and tachycardic and some concern for infectious etiology vs hypovolemic shock  Past medical history-As per Problem list Chart reviewed as below-   Consultants:    Procedures:    Antibiotics:  None yet   Subjective   Well no f No chill No n/v Had 4 episodes diarr daugther tells me was Rx for this x2 courses about the time of prior PD cath removal and was Rx with some abx resulting in Cdiff being contracted.  Was rx with flagyll No current fever or chills Feels like the Renvela or Sensipar is cauing his symptoms and has done this in the past No cp No sob No cold no cough   Objective    Interim History: nad  Telemetry:  ns   Objective: Filed Vitals:   12/29/14 0000 12/29/14 0020 12/29/14 0413 12/29/14 0548  BP: 120/65 106/73 105/58   Pulse: 93 95 90   Temp:  98.5 F (36.9 C) 98.6 F (37 C)   TempSrc:  Oral Oral   Resp: 16 15 15    Height:      Weight:    87.2 kg (192 lb 3.9 oz)  SpO2: 97% 96% 100%     Intake/Output Summary (Last 24 hours) at 12/29/14 0803 Last data filed at 12/29/14 0547  Gross per 24 hour  Intake 3116.25 ml  Output     15 ml  Net 3101.25 ml    Exam:  General: eomi ncat Cardiovascular: s1 s 2no m/r/g-neck veins are flat Respiratory: clear no added sound.  No rales no rhinchi Abdomen:  Soft, nt, no distension-no preitoneal signs Skin no le edema-scars to abd with patent PD cath-Fistula in L arnm Neuro intact  Data Reviewed: Basic Metabolic Panel:  Recent Labs Lab 12/28/14 1509 12/28/14 1512 12/29/14 0256  NA  143 140 139  K 3.8 3.9 4.4  CL 103 98* 106  CO2  --  18* 16*  GLUCOSE 172* 181* 74  BUN 91* 92* 88*  CREATININE 16.90* 17.50* 16.37*  CALCIUM  --  9.1 8.1*   Liver Function Tests:  Recent Labs Lab 12/28/14 1512 12/29/14 0256  AST 16 12*  ALT 15* 12*  ALKPHOS 88 65  BILITOT 0.6 0.7  PROT 7.2 5.7*  ALBUMIN 3.7 2.9*   No results for input(s): LIPASE, AMYLASE in the last 168 hours. No results for input(s): AMMONIA in the last 168 hours. CBC:  Recent Labs Lab 12/28/14 1509 12/28/14 1512 12/28/14 2119 12/29/14 0256  WBC  --  8.7 10.3 9.2  HGB 13.6 11.7* 10.5* 9.8*  HCT 40.0 36.1* 32.2* 30.1*  MCV  --  92.3 92.5 95.3  PLT  --  313 242 215   Cardiac Enzymes:  Recent Labs Lab 12/28/14 1512  TROPONINI 0.05*   BNP: Invalid input(s): POCBNP CBG:  Recent Labs Lab 12/28/14 2157 12/28/14 2248  GLUCAP 65 87    Recent Results (from the past 240 hour(s))  Blood culture (routine x 2)     Status: None (Preliminary result)   Collection Time: 12/28/14  4:03 PM  Result Value Ref Range Status   Specimen Description BLOOD  RIGHT HAND  Final   Special Requests IN PEDIATRIC BOTTLE 3CC  Final   Culture PENDING  Incomplete   Report Status PENDING  Incomplete  MRSA PCR Screening     Status: None   Collection Time: 12/28/14  9:27 PM  Result Value Ref Range Status   MRSA by PCR NEGATIVE NEGATIVE Final    Comment:        The GeneXpert MRSA Assay (FDA approved for NASAL specimens only), is one component of a comprehensive MRSA colonization surveillance program. It is not intended to diagnose MRSA infection nor to guide or monitor treatment for MRSA infections.   C difficile quick scan w PCR reflex     Status: Abnormal   Collection Time: 12/29/14  5:51 AM  Result Value Ref Range Status   C Diff antigen POSITIVE (A) NEGATIVE Final   C Diff toxin NEGATIVE NEGATIVE Final   C Diff interpretation   Final    C. difficile present, but toxin not detected. This indicates  colonization. In most cases, this does not require treatment. If patient has signs and symptoms consistent with colitis, consider treatment. Requires ENTERIC precautions.     Studies:              All Imaging reviewed and is as per above notation   Scheduled Meds: . allopurinol  100 mg Oral Daily  . aspirin EC  325 mg Oral Daily  . calcitRIOL  0.25 mcg Oral Q M,W,F-2000  . cinacalcet  30 mg Oral Q breakfast  . enoxaparin (LOVENOX) injection  30 mg Subcutaneous Q24H  . feeding supplement (ENSURE ENLIVE)  237 mL Oral BID BM  . sevelamer carbonate  2,400 mg Oral TID WC   Continuous Infusions:    Assessment/Plan:  1. Hypovolemic shock-2/2 to overdialysis.  Now resolved.  Monitor bp trend, tx to tele.  Nephrology to comment 2. CDIFF Toxin +-4 loose stools overnight-thinks that Ph Binders casuing.  As AG is neg, will see if nephrology has any other strategies to hold off on this.  Would not currently Rx with AG being neg.  Can use Vanc PO moving forward.   3. Chest pain-Patient had this at the time of trying to move around EKG subtle changes. cycle troponins. repeat EKG now 4. Hypertension With hypovolemic shock usual blood pressures range in the 160 range We will attempt more aggressive blood pressure control once he is normotensive and hence have held off on hydralazine 100 3 times a day, lisinopril 10 OD, metoprolol 100 twice a day, Lasix 80 twice a day  5. Anemia Of renal disease Continue multivitamins and supplementation Does not appear to have need for erythropoietin as the hemoglobin is 11  6.  DM (diabetes mellitus), type 2 with renal complications, diet controlled Not currently on any home med 7. Secondary hyperparathyroidism will temporarily d/c calcitriol 0.25, Sensipar 30 daily, Renvela 800 -await Neprho input 8. Gout Continue allopurinol 100 daily may need to be renally dosed 9. ESRD on peritoneal dialysis (HCC)See above discussion Nephrology to see patient in a.m. and adjust    10. Stroke [facility]) Continue aspirin 325 daily  11. Prostate CA [facility]) outpatient surveillance   Code Status: full Family Communication:  None bedside Disposition Plan: tx tele am.  Await stool resolution and hold bingders.  Could d/c as early as tom am.  D/w daughter Pincus Sanes, MD  [facility] Pager [phone] 12/29/2014, 8:03 AM    LOS: 1 day

## 2014-12-29 NOTE — Progress Notes (Signed)
Hypoglycemic Event  CBG: 65  Treatment: 15 GM carbohydrate snack  Symptoms: None  Follow-up CBG: Time:2248 CBG Result:87  Possible Reasons for Event: Inadequate meal intake  Comments/MD notified:    Reap, Jon Gills

## 2014-12-30 DIAGNOSIS — R571 Hypovolemic shock: Secondary | ICD-10-CM | POA: Diagnosis not present

## 2014-12-30 LAB — CBC WITH DIFFERENTIAL/PLATELET
BASOS ABS: 0 10*3/uL (ref 0.0–0.1)
Basophils Relative: 1 %
Eosinophils Absolute: 0.5 10*3/uL (ref 0.0–0.7)
Eosinophils Relative: 6 %
HCT: 30.5 % — ABNORMAL LOW (ref 39.0–52.0)
HEMOGLOBIN: 9.6 g/dL — AB (ref 13.0–17.0)
LYMPHS ABS: 1.8 10*3/uL (ref 0.7–4.0)
LYMPHS PCT: 21 %
MCH: 29.9 pg (ref 26.0–34.0)
MCHC: 31.5 g/dL (ref 30.0–36.0)
MCV: 95 fL (ref 78.0–100.0)
MONO ABS: 1 10*3/uL (ref 0.1–1.0)
MONOS PCT: 11 %
NEUTROS ABS: 5.2 10*3/uL (ref 1.7–7.7)
Neutrophils Relative %: 61 %
PLATELETS: 258 10*3/uL (ref 150–400)
RBC: 3.21 MIL/uL — AB (ref 4.22–5.81)
RDW: 16.4 % — ABNORMAL HIGH (ref 11.5–15.5)
WBC: 8.5 10*3/uL (ref 4.0–10.5)

## 2014-12-30 LAB — RENAL FUNCTION PANEL
ALBUMIN: 2.8 g/dL — AB (ref 3.5–5.0)
ANION GAP: 19 — AB (ref 5–15)
BUN: 94 mg/dL — ABNORMAL HIGH (ref 6–20)
CALCIUM: 8.5 mg/dL — AB (ref 8.9–10.3)
CO2: 20 mmol/L — AB (ref 22–32)
CREATININE: 16.33 mg/dL — AB (ref 0.61–1.24)
Chloride: 105 mmol/L (ref 101–111)
GFR calc non Af Amer: 2 mL/min — ABNORMAL LOW (ref >60)
GFR, EST AFRICAN AMERICAN: 3 mL/min — AB (ref >60)
GLUCOSE: 105 mg/dL — AB (ref 65–99)
PHOSPHORUS: 7.3 mg/dL — AB (ref 2.5–4.6)
Potassium: 4.3 mmol/L (ref 3.5–5.1)
SODIUM: 144 mmol/L (ref 135–145)

## 2014-12-30 MED ORDER — ACETAMINOPHEN 325 MG PO TABS
650.0000 mg | ORAL_TABLET | ORAL | Status: DC | PRN
  Administered 2014-12-30: 650 mg via ORAL
  Filled 2014-12-30: qty 2

## 2014-12-30 NOTE — Progress Notes (Signed)
The patient was discharged to home.  He is accompanied by his daughter who is giving him a ride home.  Vital signs are stable. Both IV lines were removed from his right arm, the sites were clean, dry, and intact at time of discharge.  All pt belongings were sent home with the patient.

## 2014-12-30 NOTE — Discharge Summary (Signed)
Physician Discharge Summary  Sean Best [id number] DOB: Dec 24, 1933 DOA: 12/28/2014  PCP: [name], MD  Admit date: 12/28/2014 Discharge date: 12/30/2014  Time spent: 40 minutes  Recommendations for Outpatient Follow-up:  1. Please get labs as an OP 2. Home dialysis will come out to adjust your dialysis as per nephrology instructions 3. Diarrhea non-infectious-Cdiff AG + but not toxin.  Did NOT Rx thjs admission for Cdiff [has been recently rx x 2 as OP]  4. Dry weight on d/c adjusted to 85 kg 5. Anti-htn meds adjusted and lisinopril held this admi.  Re-start as per Dr. Joelyn Oms as OP  Discharge Diagnoses:  Principal Problem:   Hypovolemic shock ([place]) Active Problems:   Anemia   DM (diabetes mellitus), type 2 with renal complications, diet controlled   ESRD on peritoneal dialysis [facility])   Stroke [facility])   Hypertension   Prostate CA [facility])   Chest pain   Discharge Condition: fair  Diet recommendation: hh low salt  Filed Weights   12/29/14 0548 12/29/14 1800 12/30/14 0423  Weight: 87.2 kg (192 lb 3.9 oz) 88.1 kg (194 lb 3.6 oz) 88.9 kg (195 lb 15.8 oz)    History of present illness:  81 y/o ? ESRD Prior CVa w/o deficit Htn Hld Prostate Ca Gout ty ii DM on insulin Dialysis Incontinence to stool Admitted from home after recent re-start on PD 10/25 which resulted in volume depletion as the EDW was being adjusted by manipulating the Dialysate fluid concentration  Initially hypotensive and tachycardic and some concern for infectious etiology vs hypovolemic shock  Hospital Course:   1. Hypovolemic shock-2/2 to overdialysis. Now resolved. Monitor bp trend, tx to tele. Nephrology Dr, Lorrene Reid saw patient in consult and adjusted dialysate fluid conc.  BP much betetr on d/c and Anti-htn meds restarted other than Lisinopril 10 which was held as per nephro-may need restarting as adjusting EDW 2. CDIFF Toxin +-4 loose stools overnight-thinks that Ph Binders casuing.  As AG is neg, will see if nephrology has any other strategies to hold off on this. Would not currently Rx with AG being neg. no Rx for colonization at this time.  Only 1 stool over 24 hours 3. Chest pain-Patient had this at the time of trying to move around EKG subtle changes. cycle troponin's showed a flat trend adn troponin likely 2/2 to ESRD. repeat EKG non-concerning for ischmeia 4. Hypertension With hypovolemic shock usual blood pressures range in the 160 range We will attempt more aggressive blood pressure control once he is normotensive and hence have held off on lisinopril 10 OD.  On d/c resumed hydralazine 100 3 times a day, , metoprolol 100 twice a day, Lasix 80 twice a day  5. Anemia Of renal disease Continue multivitamins and supplementation Does not appear to have need for erythropoietin as the hemoglobin is 11  6.  DM (diabetes mellitus), type 2 with renal complications, diet controlled Not currently on any home med 7. Secondary hyperparathyroidism-no real options per nephro.  Should continue calcitriol 0.25, Sensipar 30 daily, Renvela 800 -await Neprho input 8. Gout Continue allopurinol 100 daily may need to be renally dosed 9. ESRD on peritoneal dialysis (HCC)See above discussion Nephrology  Made adjustments to dilaysate.  THis will be further adjusted as OP  10. Stroke [facility]) Continue aspirin 325 daily  11. Prostate CA [facility]) outpatient surveillance  Consultations:  Nephro  Discharge Exam: Filed Vitals:   12/30/14 0413  BP: 153/78  Pulse: 88  Temp: 98.3 F (36.8 C)  Resp: 52   Well uneventful pm No cp no sob occ cough  Feels much better overall   General: eomi ncat Cardiovascular:  s1 s 2no m/r/g-tele benign Respiratory: clear no added sound No LE edema  Discharge Instructions   Discharge Instructions    Diet - low sodium heart healthy    Complete by:  As directed      Discharge instructions    Complete by:  As directed   Please take your binders as  much as you can-these are important for your bones and other issues stop your lisinopril for now-this will be added back to your meds if we feel there is a need-continue all the other blood pressure meds Nephrology will call to adjust your dialysis fluids with yuor home dialysis unit and hellp with that adjustment and they should come out in the next 24-48 hours and make these changes     Increase activity slowly    Complete by:  As directed           Current Discharge Medication List    CONTINUE these medications which have NOT CHANGED   Details  acetaminophen (TYLENOL) 500 MG tablet Take 1,000 mg by mouth daily as needed for headache.    allopurinol (ZYLOPRIM) 100 MG tablet Take 100 mg by mouth daily.      aspirin EC 325 MG tablet Take 325 mg by mouth daily.    atorvastatin (LIPITOR) 40 MG tablet Take 40 mg by mouth daily.      calcitRIOL (ROCALTROL) 0.25 MCG capsule Take 0.25 mcg by mouth. Take 0.25 mcg on Mon, Wed, and Friday Refills: 6    diphenhydrAMINE (BENADRYL) 25 MG tablet Take 25 mg by mouth daily as needed for itching.    diphenoxylate-atropine (LOMOTIL) 2.5-0.025 MG per tablet TAKE 1 TABLET BY MOUTH EVERY 4 TO 6 HOURS AS NEEDED Qty: 30 tablet, Refills: 0    furosemide (LASIX) 80 MG tablet Take 80 mg by mouth 2 (two) times daily. Refills: 12    hydrALAZINE (APRESOLINE) 100 MG tablet Take 100 mg by mouth 3 (three) times daily. Refills: 11    metoprolol (LOPRESSOR) 100 MG tablet Take 100 mg by mouth 2 (two) times daily.    multivitamin (RENA-VIT) TABS tablet Take 1 tablet by mouth daily.    SENSIPAR 30 MG tablet Take 30 mg by mouth daily.    sevelamer carbonate (RENVELA) 800 MG tablet Take 1,600-2,400 mg by mouth See admin instructions. Take 3 tablets (2400 mg) by mouth 3 times daily with meals and 2 tablets (1600 mg) twice daily with snacks    ACCU-CHEK COMPACT STRIPS test strip       STOP taking these medications     lisinopril (PRINIVIL,ZESTRIL) 10 MG  tablet        Allergies  Allergen Reactions  . Cardura [Doxazosin Mesylate] Other (See Comments)    Hallucinations      The results of significant diagnostics from this hospitalization (including imaging, microbiology, ancillary and laboratory) are listed below for reference.    Significant Diagnostic Studies: Dg Chest Port 1 View  12/28/2014  CLINICAL DATA:  Weakness, hypotension, stroke, CPR, diabetes mellitus, end- stage renal disease EXAM: PORTABLE CHEST 1 VIEW COMPARISON:  Portable exam 1510 hours compared to 12/09/2013 FINDINGS: External pacing leads. Enlargement of cardiac silhouette. Mediastinal contours and pulmonary vascularity normal for technique. Lungs clear. No infiltrate, pleural effusion or pneumothorax. No definite fractures identified. IMPRESSION: No acute abnormalities. Electronically Signed   By: Lavonia Dana  M.D.   On: 12/28/2014 15:23    Microbiology: Recent Results (from the past 240 hour(s))  Blood culture (routine x 2)     Status: None (Preliminary result)   Collection Time: 12/28/14  4:03 PM  Result Value Ref Range Status   Specimen Description BLOOD RIGHT HAND  Final   Special Requests IN PEDIATRIC BOTTLE 3CC  Final   Culture NO GROWTH < 24 HOURS  Final   Report Status PENDING  Incomplete  Blood culture (routine x 2)     Status: None (Preliminary result)   Collection Time: 12/28/14  5:25 PM  Result Value Ref Range Status   Specimen Description BLOOD RIGHT HAND  Final   Special Requests BOTTLES DRAWN AEROBIC ONLY 6CC  Final   Culture NO GROWTH < 24 HOURS  Final   Report Status PENDING  Incomplete  MRSA PCR Screening     Status: None   Collection Time: 12/28/14  9:27 PM  Result Value Ref Range Status   MRSA by PCR NEGATIVE NEGATIVE Final    Comment:        The GeneXpert MRSA Assay (FDA approved for NASAL specimens only), is one component of a comprehensive MRSA colonization surveillance program. It is not intended to diagnose MRSA infection nor  to guide or monitor treatment for MRSA infections.   C difficile quick scan w PCR reflex     Status: Abnormal   Collection Time: 12/29/14  5:51 AM  Result Value Ref Range Status   C Diff antigen POSITIVE (A) NEGATIVE Final   C Diff toxin NEGATIVE NEGATIVE Final   C Diff interpretation   Final    C. difficile present, but toxin not detected. This indicates colonization. In most cases, this does not require treatment. If patient has signs and symptoms consistent with colitis, consider treatment. Requires ENTERIC precautions.     Labs: Basic Metabolic Panel:  Recent Labs Lab 12/28/14 1509 12/28/14 1512 12/29/14 0256 12/30/14 0304  NA 143 140 139 144  K 3.8 3.9 4.4 4.3  CL 103 98* 106 105  CO2  --  18* 16* 20*  GLUCOSE 172* 181* 74 105*  BUN 91* 92* 88* 94*  CREATININE 16.90* 17.50* 16.37* 16.33*  CALCIUM  --  9.1 8.1* 8.5*  PHOS  --   --   --  7.3*   Liver Function Tests:  Recent Labs Lab 12/28/14 1512 12/29/14 0256 12/30/14 0304  AST 16 12*  --   ALT 15* 12*  --   ALKPHOS 88 65  --   BILITOT 0.6 0.7  --   PROT 7.2 5.7*  --   ALBUMIN 3.7 2.9* 2.8*   No results for input(s): LIPASE, AMYLASE in the last 168 hours. No results for input(s): AMMONIA in the last 168 hours. CBC:  Recent Labs Lab 12/28/14 1509 12/28/14 1512 12/28/14 2119 12/29/14 0256 12/30/14 0304  WBC  --  8.7 10.3 9.2 8.5  NEUTROABS  --   --   --   --  5.2  HGB 13.6 11.7* 10.5* 9.8* 9.6*  HCT 40.0 36.1* 32.2* 30.1* 30.5*  MCV  --  92.3 92.5 95.3 95.0  PLT  --  313 242 215 258   Cardiac Enzymes:  Recent Labs Lab 12/28/14 1512 12/29/14 0945 12/29/14 1655 12/29/14 2027  TROPONINI 0.05* 0.08* 0.07* 0.07*   BNP: BNP (last 3 results) No results for input(s): BNP in the last 8760 hours.  ProBNP (last 3 results) No results for input(s): PROBNP in the  last 8760 hours.  CBG:  Recent Labs Lab 12/28/14 2157 12/28/14 2248  GLUCAP 65 87     Signed:  Nita Sells  [facility] 12/30/2014, 8:02 AM

## 2014-12-30 NOTE — Progress Notes (Signed)
[facility] Rounding Note Subjective:  Feels better 2 loose stools/24 hours which is better (he says) Binders and BP meds held past 24 hours (BP now back up - restarting meds) PD last night half/half 2.5/4.25% dialysate Getting reweighed now - had 2300 in - currently 3200 out and still draining so not "dry" but close - 85.1 - so 84-85 prob good new EDW  No edema, not SOB  Objective Vital signs in last 24 hours: Filed Vitals:   12/29/14 1800 12/29/14 2100 12/30/14 0413 12/30/14 0423  BP:  143/62 153/78   Pulse:  87 88   Temp:  98 F (36.7 C) 98.3 F (36.8 C)   TempSrc:  Oral Oral   Resp:  16 18   Height: 5\' 5"  (1.651 m)     Weight: 88.1 kg (194 lb 3.6 oz)   88.9 kg (195 lb 15.8 oz)  SpO2:  100% 98%    Weight change: 1.916 kg (4 lb 3.6 oz)  Intake/Output Summary (Last 24 hours) at 12/30/14 0835 Last data filed at 12/29/14 1507  Gross per 24 hour  Intake    120 ml  Output    800 ml  Net   -680 ml   Physical Exam:   BP 153/78 mmHg  Pulse 88  Temp(Src) 98.3 F (36.8 C) (Oral)  Resp 18  Ht 5\' 5"  (1.651 m)  Wt 88.9 kg (195 lb 15.8 oz)  BMI 32.61 kg/m2  SpO2 98% Weight (almost to end of drain this AM - 85.1 kg) General: Well developed, well nourished, in no acute distress. Head: Normocephalic, atraumatic, sclera non-icteric, mucus membranes are moist Neck: No JVD Lungs: Clear Heart: Regular S1S2 No S3 Abdomen: Soft, non-tender. PD catheter.  Lower extremities: No LE edema Neuro: Alert and oriented X 3. Moves all extremities spontaneously.. Dialysis Access: PD catheter and left AVF (w+ bruit)   Labs: Basic Metabolic Panel:  Recent Labs Lab 12/28/14 1509 12/28/14 1512 12/29/14 0256 12/30/14 0304  NA 143 140 139 144  K 3.8 3.9 4.4 4.3  CL 103 98* 106 105  CO2  --  18* 16* 20*  GLUCOSE 172* 181* 74 105*  BUN 91* 92* 88* 94*  CREATININE 16.90* 17.50* 16.37* 16.33*  CALCIUM  --  9.1 8.1* 8.5*  PHOS  --   --   --  7.3*     Recent Labs Lab  12/28/14 1512 12/29/14 0256 12/30/14 0304  AST 16 12*  --   ALT 15* 12*  --   ALKPHOS 88 65  --   BILITOT 0.6 0.7  --   PROT 7.2 5.7*  --   ALBUMIN 3.7 2.9* 2.8*      Recent Labs Lab 12/28/14 1512 12/28/14 2119 12/29/14 0256 12/30/14 0304  WBC 8.7 10.3 9.2 8.5  NEUTROABS  --   --   --  5.2  HGB 11.7* 10.5* 9.8* 9.6*  HCT 36.1* 32.2* 30.1* 30.5*  MCV 92.3 92.5 95.3 95.0  PLT 313 242 215 258     Recent Labs Lab 12/28/14 1512 12/29/14 0945 12/29/14 1655 12/29/14 2027  TROPONINI 0.05* 0.08* 0.07* 0.07*     Recent Labs Lab 12/28/14 2157 12/28/14 2248  GLUCAP 65 87    Studies/Results: Dg Chest Port 1 View  12/28/2014  CLINICAL DATA:  Weakness, hypotension, stroke, CPR, diabetes mellitus, end- stage renal disease EXAM: PORTABLE CHEST 1 VIEW COMPARISON:  Portable exam 1510 hours compared to 12/09/2013 FINDINGS: External pacing leads. Enlargement of cardiac silhouette. Mediastinal contours  and pulmonary vascularity normal for technique. Lungs clear. No infiltrate, pleural effusion or pneumothorax. No definite fractures identified. IMPRESSION: No acute abnormalities. Electronically Signed   By: Lavonia Dana M.D.   On: 12/28/2014 15:23   Medications:   . allopurinol  100 mg Oral Daily  . aspirin EC  325 mg Oral Daily  . dialysis solution 2.5% low-MG/low-CA   Intraperitoneal Q24H  . dialysis solution 4.25% low-MG/low-CA   Intraperitoneal Q24H  . enoxaparin (LOVENOX) injection  30 mg Subcutaneous Q24H  . feeding supplement (ENSURE ENLIVE)  237 mL Oral BID BM  . gentamicin cream  1 application Topical Daily  . sevelamer carbonate  2,400 mg Oral TID WC   Assessment/Plan: 1. Hypotension/AMS - post completion of PD 10/28. Documented hypotension. Responded to IVF (3 liters). Last night changed regimen to 1/2 2.5/1/2 4.25 - weight this AM (not quite fully drained yet) 85.1 (yesterday was 87 post fluid resuscitation and with 2 liters in so 85) so appears EDW of 84-85 may be  more appropriate for him (no edema, no SOB)  2. Chest pain - minimally elevated troponins. Low suspicion for ischemia. 3. ESRD - On PD - see above discussion 4. Hypertension/volume - Has been on 80 bid of lasix, hydralazine 100 tid and MTP 100 bid, norvasc 10 and Dr. Joelyn Oms added lisinopril 10 on 10/18 during a PD visit on 10/18. Outpt BP's have tended to be quite elevated. BP meds added back today - will add back all but lisinopril. Readdition of this can be assessed as outpatient.  5. Anemia - on Mircera as outpt last dosed on 10/6 with Hb of 11 on 10/17. Hb drop to 9.8 in part dilutional d/t administration of 3 L of fluid. 9.6 today. Will need redosing with Mircera through the home training unit. 6. Metabolic bone disease - On renvela, phoslo and sensipar. He has recently "self adjusted" his binders when he gets diarrhea and has thought renvela related. However - says he has been on renvela for over a year and the intermittent diarrhea is a more recent development.Only 2 stools in the hospital with binders and sensipar on hold. He will resume both, further adjustment or decision to change from renvela to fosrenol can be addressed in the outpatient setting. No good alternative to sensipar for him (PTH >800 last check) 7.  CDiff toxin neg/Ag+. Has been treated in the past for CDiff. No treatment for toxin neg but will need to remain on contact precautions. 8. Disposition - I am fine with discharge to home. Resume all BP meds except for lisinopril. Change for now to half 2.5% and half 4.25% dialysate with new EDW 84-85 kg. Daughter aware. I will call home training in the AM, FAX d/c summary and prog notes.   Jamal Maes, MD [facility] [phone] pager 12/30/2014, 8:35 AM

## 2015-01-01 LAB — GI PATHOGEN PANEL BY PCR, STOOL
C difficile toxin A/B: NOT DETECTED
CAMPYLOBACTER BY PCR: NOT DETECTED
CRYPTOSPORIDIUM BY PCR: NOT DETECTED
E COLI (ETEC) LT/ST: NOT DETECTED
E COLI (STEC): NOT DETECTED
E COLI 0157 BY PCR: NOT DETECTED
G LAMBLIA BY PCR: NOT DETECTED
NOROVIRUS G1/G2: NOT DETECTED
Rotavirus A by PCR: NOT DETECTED
Salmonella by PCR: NOT DETECTED
Shigella by PCR: NOT DETECTED

## 2015-01-02 LAB — CULTURE, BLOOD (ROUTINE X 2)
CULTURE: NO GROWTH
CULTURE: NO GROWTH

## 2015-05-01 DIAGNOSIS — E039 Hypothyroidism, unspecified: Secondary | ICD-10-CM | POA: Insufficient documentation

## 2015-11-01 ENCOUNTER — Emergency Department (HOSPITAL_COMMUNITY)
Admission: EM | Admit: 2015-11-01 | Discharge: 2015-11-01 | Disposition: A | Discharge status: Home / Self Care | Payer: Medicare Other | Attending: Emergency Medicine | Admitting: Emergency Medicine

## 2015-11-01 ENCOUNTER — Encounter (HOSPITAL_COMMUNITY): Payer: Self-pay | Admitting: Emergency Medicine

## 2015-11-01 DIAGNOSIS — E1122 Type 2 diabetes mellitus with diabetic chronic kidney disease: Secondary | ICD-10-CM | POA: Insufficient documentation

## 2015-11-01 DIAGNOSIS — Z7982 Long term (current) use of aspirin: Secondary | ICD-10-CM | POA: Diagnosis not present

## 2015-11-01 DIAGNOSIS — R55 Syncope and collapse: Secondary | ICD-10-CM | POA: Insufficient documentation

## 2015-11-01 DIAGNOSIS — Z8546 Personal history of malignant neoplasm of prostate: Secondary | ICD-10-CM | POA: Diagnosis not present

## 2015-11-01 DIAGNOSIS — Z8673 Personal history of transient ischemic attack (TIA), and cerebral infarction without residual deficits: Secondary | ICD-10-CM | POA: Insufficient documentation

## 2015-11-01 DIAGNOSIS — Z992 Dependence on renal dialysis: Secondary | ICD-10-CM | POA: Insufficient documentation

## 2015-11-01 DIAGNOSIS — N186 End stage renal disease: Secondary | ICD-10-CM | POA: Insufficient documentation

## 2015-11-01 DIAGNOSIS — I120 Hypertensive chronic kidney disease with stage 5 chronic kidney disease or end stage renal disease: Secondary | ICD-10-CM | POA: Insufficient documentation

## 2015-11-01 LAB — CBC
HCT: 38.9 % — ABNORMAL LOW (ref 39.0–52.0)
Hemoglobin: 12.2 g/dL — ABNORMAL LOW (ref 13.0–17.0)
MCH: 31 pg (ref 26.0–34.0)
MCHC: 31.4 g/dL (ref 30.0–36.0)
MCV: 98.7 fL (ref 78.0–100.0)
PLATELETS: 289 10*3/uL (ref 150–400)
RBC: 3.94 MIL/uL — AB (ref 4.22–5.81)
RDW: 15.7 % — AB (ref 11.5–15.5)
WBC: 8.2 10*3/uL (ref 4.0–10.5)

## 2015-11-01 LAB — BASIC METABOLIC PANEL
Anion gap: 14 (ref 5–15)
BUN: 10 mg/dL (ref 6–20)
CALCIUM: 8.4 mg/dL — AB (ref 8.9–10.3)
CHLORIDE: 93 mmol/L — AB (ref 101–111)
CO2: 28 mmol/L (ref 22–32)
CREATININE: 4.35 mg/dL — AB (ref 0.61–1.24)
GFR calc non Af Amer: 12 mL/min — ABNORMAL LOW (ref >60)
GFR, EST AFRICAN AMERICAN: 14 mL/min — AB (ref >60)
Glucose, Bld: 154 mg/dL — ABNORMAL HIGH (ref 65–99)
Potassium: 3.7 mmol/L (ref 3.5–5.1)
SODIUM: 135 mmol/L (ref 135–145)

## 2015-11-01 LAB — CBG MONITORING, ED: GLUCOSE-CAPILLARY: 167 mg/dL — AB (ref 65–99)

## 2015-11-01 NOTE — Discharge Instructions (Signed)
Stay well hydrated. If you were given medicines take as directed.  If you are on coumadin or contraceptives realize their levels and effectiveness is altered by many different medicines.  If you have any reaction (rash, tongues swelling, other) to the medicines stop taking and see a physician.    If your blood pressure was elevated in the ER make sure you follow up for management with a primary doctor or return for chest pain, shortness of breath or stroke symptoms.  Please follow up as directed and return to the ER or see a physician for new or worsening symptoms.  Thank you. Vitals:   11/01/15 1722 11/01/15 1728 11/01/15 1745 11/01/15 1845  BP:  109/66 120/77 128/77  Pulse:  90 88 94  Resp:  19 21 15   Temp:  98.3 F (36.8 C)    TempSrc:  Oral    SpO2:  98% 99% 98%  Weight: 194 lb (88 kg)     Height: 5\' 5"  (1.651 m)

## 2015-11-01 NOTE — ED Provider Notes (Signed)
[place] DEPT Provider Note   CSN: [id number] Arrival date & time: 11/01/15  1715     History   Chief Complaint Chief Complaint  Patient presents with  . Loss of Consciousness    HPI Sean Best is a 82 y.o. male.  Patient with renal disease history on dialysis Monday Wednesday Friday presents after syncopal episode. Patient had dialysis until 4:00 PM had 3 L removed. Patient has had multiple similar episodes after dialysis. No active bleeding. Patient had baseline currently. No seizure activity. No chest pain or short of breath. Patient felt lightheaded and then well getting his haircut P blood help him sit down until EMS arrived. Patient does not feel he completely passed out. Family in the room      Past Medical History:  Diagnosis Date  . Anemia   . Arthritis   . Cancer [facility])    prostate cancer  . Chronic total occlusion of artery of the extremities (HCC)    pt not aware of this  . Claudication ([place])   . Diabetes mellitus   . ESRD on peritoneal dialysis [facility])    Started dialysis around April 2015 per son.  Has been doing peritoneal dialysis at home.     . Gout   . Hyperlipidemia   . Hypertension   . Overweight(278.02)   . Shingles   . Stroke ([place])   . Umbilical hernia     Patient Active Problem List   Diagnosis Date Noted  . Hypovolemic shock ([place]) 12/28/2014  . Stroke ([place]) 12/28/2014  . Hypertension 12/28/2014  . Prostate CA ([place]) 12/28/2014  . Chest pain 12/28/2014  . Diarrhea 08/29/2014  . ESRD on peritoneal dialysis ([place]) 12/09/2013  . Uremia 12/09/2013  . Thigh shingles 12/09/2013  . Acute encephalopathy 12/09/2013  . Encephalopathy acute 03/11/2013  . Hyperkalemia 03/11/2013  . Anemia 03/11/2013  . DM (diabetes mellitus), type 2 with renal complications, diet controlled 03/11/2013  . Benign hypertension 03/11/2013  . Hyperlipidemia 03/11/2013  . Umbilical hernia 0000000    Past Surgical History:  Procedure Laterality Date  . AV  FISTULA PLACEMENT, RADIOCEPHALIC  123456   Left arm  . CAPD INSERTION N/A 03/06/2013   Procedure: LAPAROSCOPIC INSERTION CONTINUOUS AMBULATORY PERITONEAL DIALYSIS  (CAPD) CATHETER;  Surgeon: Edward Jolly, MD;  Location: [place];  Service: General;  Laterality: N/A;  . EYE SURGERY Left    cataract surgery  . EYE SURGERY Left    for eye injury  . PROSTATE SURGERY     prostate seed placement  . UMBILICAL HERNIA REPAIR N/A 03/06/2013   Procedure: HERNIA REPAIR UMBILICAL WITH MESH;  Surgeon: Edward Jolly, MD;  Location: [facility] OR;  Service: General;  Laterality: N/A;       Home Medications    Prior to Admission medications   Medication Sig Start Date End Date Taking? Authorizing Provider  ACCU-CHEK COMPACT STRIPS test strip  05/20/10   Historical Provider, MD  acetaminophen (TYLENOL) 500 MG tablet Take 1,000 mg by mouth daily as needed for headache.    Historical Provider, MD  allopurinol (ZYLOPRIM) 100 MG tablet Take 100 mg by mouth daily.      Historical Provider, MD  aspirin EC 325 MG tablet Take 325 mg by mouth daily.    Historical Provider, MD  atorvastatin (LIPITOR) 40 MG tablet Take 40 mg by mouth daily.      Historical Provider, MD  calcitRIOL (ROCALTROL) 0.25 MCG capsule Take 0.25 mcg by mouth. Take 0.25 mcg on Mon,  Wed, and Friday 03/13/14   Historical Provider, MD  diphenhydrAMINE (BENADRYL) 25 MG tablet Take 25 mg by mouth daily as needed for itching.    Historical Provider, MD  diphenoxylate-atropine (LOMOTIL) 2.5-0.025 MG per tablet TAKE 1 TABLET BY MOUTH EVERY 4 TO 6 HOURS AS NEEDED 10/09/14   Laban Emperor Zehr, PA-C  furosemide (LASIX) 80 MG tablet Take 80 mg by mouth 2 (two) times daily. 05/07/14   Historical Provider, MD  hydrALAZINE (APRESOLINE) 100 MG tablet Take 100 mg by mouth 3 (three) times daily. 05/09/14   Historical Provider, MD  metoprolol (LOPRESSOR) 100 MG tablet Take 100 mg by mouth 2 (two) times daily.    Historical Provider, MD  multivitamin (RENA-VIT) TABS  tablet Take 1 tablet by mouth daily.    Historical Provider, MD  SENSIPAR 30 MG tablet Take 30 mg by mouth daily. 12/12/14   Historical Provider, MD  sevelamer carbonate (RENVELA) 800 MG tablet Take 1,600-2,400 mg by mouth See admin instructions. Take 3 tablets (2400 mg) by mouth 3 times daily with meals and 2 tablets (1600 mg) twice daily with snacks    Historical Provider, MD    Family History Family History  Problem Relation Age of Onset  . Cancer Mother   . Heart disease Mother   . Cancer Father     Social History Social History  Substance Use Topics  . Smoking status: Never Smoker  . Smokeless tobacco: Never Used  . Alcohol use No     Allergies   Cardura [doxazosin mesylate]   Review of Systems Review of Systems  Constitutional: Negative for chills and fever.  HENT: Negative for congestion.   Eyes: Negative for visual disturbance.  Respiratory: Negative for shortness of breath.   Cardiovascular: Negative for chest pain.  Gastrointestinal: Negative for abdominal pain and vomiting.  Genitourinary: Negative for dysuria and flank pain.  Musculoskeletal: Negative for back pain, neck pain and neck stiffness.  Skin: Negative for rash.  Neurological: Positive for syncope and light-headedness. Negative for headaches.     Physical Exam Updated Vital Signs BP 128/77   Pulse 94   Temp 98.3 F (36.8 C) (Oral)   Resp 15   Ht 5\' 5"  (1.651 m)   Wt 194 lb (88 kg)   SpO2 98%   BMI 32.28 kg/m   Physical Exam  Constitutional: He is oriented to person, place, and time. He appears well-developed and well-nourished.  HENT:  Head: Normocephalic and atraumatic.  Mild dry mucous membranes  Eyes: Conjunctivae are normal. Right eye exhibits no discharge. Left eye exhibits no discharge.  Neck: Normal range of motion. Neck supple. No tracheal deviation present.  Cardiovascular: Normal rate and regular rhythm.   Pulmonary/Chest: Effort normal and breath sounds normal.  Abdominal:  Soft. He exhibits no distension. There is no tenderness. There is no guarding.  Musculoskeletal: He exhibits no edema.  Neurological: He is alert and oriented to person, place, and time.  Skin: Skin is warm. No rash noted.  Psychiatric: He has a normal mood and affect.  Nursing note and vitals reviewed.    ED Treatments / Results  Labs (all labs ordered are listed, but only abnormal results are displayed) Labs Reviewed  BASIC METABOLIC PANEL - Abnormal; Notable for the following:       Result Value   Chloride 93 (*)    Glucose, Bld 154 (*)    Creatinine, Ser 4.35 (*)    Calcium 8.4 (*)    GFR calc non Af  Amer 12 (*)    GFR calc Af Amer 14 (*)    All other components within normal limits  CBC - Abnormal; Notable for the following:    RBC 3.94 (*)    Hemoglobin 12.2 (*)    HCT 38.9 (*)    RDW 15.7 (*)    All other components within normal limits  CBG MONITORING, ED - Abnormal; Notable for the following:    Glucose-Capillary 167 (*)    All other components within normal limits    EKG  EKG Interpretation  Date/Time:  Friday November 01 2015 17:23:03 EDT Ventricular Rate:  92 PR Interval:    QRS Duration: 100 QT Interval:  387 QTC Calculation: 479 R Axis:   -5 Text Interpretation:  Sinus rhythm Atrial premature complexes Nonspecific T abnormalities, lateral leads Borderline prolonged QT interval Confirmed by Reather Converse MD, Kasie Leccese [id number]) on 11/01/2015 5:31:24 PM       Radiology No results found.  Procedures Procedures (including critical care time)  Medications Ordered in ED Medications - No data to display   Initial Impression / Assessment and Plan / ED Course  I have reviewed the triage vital signs and the nursing notes.  Pertinent labs & imaging results that were available during my care of the patient were reviewed by me and considered in my medical decision making (see chart for details).  Clinical Course   Patient presents after witnessed brief syncope.  Patient had baseline is similar to previous. Blood work and EKG similar to previous. Discussed outpatient follow-up staying well-hydrated and standing up slowly.  The patients results and plan were reviewed and discussed.   Any x-rays performed were independently reviewed by myself.   Differential diagnosis were considered with the presenting HPI.  Medications - No data to display  Vitals:   11/01/15 1722 11/01/15 1728 11/01/15 1745 11/01/15 1845  BP:  109/66 120/77 128/77  Pulse:  90 88 94  Resp:  19 21 15   Temp:  98.3 F (36.8 C)    TempSrc:  Oral    SpO2:  98% 99% 98%  Weight: 194 lb (88 kg)     Height: 5\' 5"  (1.651 m)       Final diagnoses:  Syncope, unspecified syncope type  ESRD on dialysis [facility])      Final Clinical Impressions(s) / ED Diagnoses   Final diagnoses:  Syncope, unspecified syncope type  ESRD on dialysis [facility])    New Prescriptions New Prescriptions   No medications on file     Elnora Morrison, MD 11/01/15 1906

## 2015-11-01 NOTE — ED Notes (Signed)
Pt provided with coffee, per request, by NT.

## 2015-11-01 NOTE — ED Triage Notes (Signed)
[facility]:  Pt goes to dialysis MWF.  Had his treatment today.  Removed 2.4lbs of fluid.  Went to [employer] shop after and had a syncopal episode in the chair.  No seizure activity noted.  No fall from chair.  Pt sts he could hear everyone talking to him, but could not talk.  Denies CP, SOB, dizziness, or weakness.  Pt sts this happens "often" after dialysis, but he does not come in for check ups.  EMS encouraged today.

## 2016-03-25 ENCOUNTER — Emergency Department (HOSPITAL_COMMUNITY)
Admission: EM | Admit: 2016-03-25 | Discharge: 2016-03-26 | Disposition: A | Discharge status: Home / Self Care | Payer: Medicare Other | Attending: Emergency Medicine | Admitting: Emergency Medicine

## 2016-03-25 ENCOUNTER — Emergency Department (HOSPITAL_COMMUNITY): Payer: Medicare Other

## 2016-03-25 ENCOUNTER — Encounter (HOSPITAL_COMMUNITY): Payer: Self-pay | Admitting: Emergency Medicine

## 2016-03-25 DIAGNOSIS — Z7982 Long term (current) use of aspirin: Secondary | ICD-10-CM | POA: Diagnosis not present

## 2016-03-25 DIAGNOSIS — E875 Hyperkalemia: Secondary | ICD-10-CM

## 2016-03-25 DIAGNOSIS — N186 End stage renal disease: Secondary | ICD-10-CM | POA: Diagnosis not present

## 2016-03-25 DIAGNOSIS — Z8673 Personal history of transient ischemic attack (TIA), and cerebral infarction without residual deficits: Secondary | ICD-10-CM | POA: Insufficient documentation

## 2016-03-25 DIAGNOSIS — J810 Acute pulmonary edema: Secondary | ICD-10-CM

## 2016-03-25 DIAGNOSIS — Z8546 Personal history of malignant neoplasm of prostate: Secondary | ICD-10-CM | POA: Diagnosis not present

## 2016-03-25 DIAGNOSIS — I120 Hypertensive chronic kidney disease with stage 5 chronic kidney disease or end stage renal disease: Secondary | ICD-10-CM | POA: Insufficient documentation

## 2016-03-25 DIAGNOSIS — E1122 Type 2 diabetes mellitus with diabetic chronic kidney disease: Secondary | ICD-10-CM | POA: Diagnosis not present

## 2016-03-25 DIAGNOSIS — M546 Pain in thoracic spine: Secondary | ICD-10-CM | POA: Diagnosis not present

## 2016-03-25 DIAGNOSIS — Z79899 Other long term (current) drug therapy: Secondary | ICD-10-CM | POA: Insufficient documentation

## 2016-03-25 LAB — CBC
HEMATOCRIT: 33.8 % — AB (ref 39.0–52.0)
Hemoglobin: 10.6 g/dL — ABNORMAL LOW (ref 13.0–17.0)
MCH: 28.9 pg (ref 26.0–34.0)
MCHC: 31.4 g/dL (ref 30.0–36.0)
MCV: 92.1 fL (ref 78.0–100.0)
Platelets: 272 10*3/uL (ref 150–400)
RBC: 3.67 MIL/uL — AB (ref 4.22–5.81)
RDW: 16.8 % — ABNORMAL HIGH (ref 11.5–15.5)
WBC: 8.4 10*3/uL (ref 4.0–10.5)

## 2016-03-25 LAB — BASIC METABOLIC PANEL
Anion gap: 19 — ABNORMAL HIGH (ref 5–15)
BUN: 48 mg/dL — AB (ref 6–20)
CHLORIDE: 96 mmol/L — AB (ref 101–111)
CO2: 25 mmol/L (ref 22–32)
Calcium: 8.5 mg/dL — ABNORMAL LOW (ref 8.9–10.3)
Creatinine, Ser: 14.07 mg/dL — ABNORMAL HIGH (ref 0.61–1.24)
GFR calc non Af Amer: 3 mL/min — ABNORMAL LOW (ref >60)
GFR, EST AFRICAN AMERICAN: 3 mL/min — AB (ref >60)
Glucose, Bld: 82 mg/dL (ref 65–99)
POTASSIUM: 5.5 mmol/L — AB (ref 3.5–5.1)
SODIUM: 140 mmol/L (ref 135–145)

## 2016-03-25 LAB — I-STAT TROPONIN, ED: Troponin i, poc: 0.02 ng/mL (ref 0.00–0.08)

## 2016-03-25 NOTE — ED Provider Notes (Signed)
By signing my name below, I, [name], attest that this documentation has been prepared under the direction and in the presence of [name], DO . Electronically Signed: [name], Scribe. 03/25/2016. 11:58 PM.  TIME SEEN: 12:07AM  CHIEF COMPLAINT: Back Pain; Flank Pain  HPI:  Sean Best is a 83 y.o. male with pmhx of ESRD on HD MWF, prostate CA and DM who presents to the Emergency Department complaining of sudden-onset, moderate upper back pain beginning two days ago. Pt describes his symptoms as a 7/10 pain located between his shoulder blades which is exacerbated by coughing and movement. He notes that his associated cough has been present for about a month and is productive of white sputum. Pt has tried alka-seltzer and aspirin at home with minimal relief. He denies any fever, vomiting, bowel/bladder incontinence, urinary retention, numbness or tingling, focal weakness or diarrhea. Pt was last seen for his typical dialysis 3 days ago and notes that he missed his appointment today due to his pain. His dialysis center is [facility]. His nephrologist is Dr. Jimmy Footman.  ROS: See HPI Constitutional: no fever  Eyes: no drainage  ENT: no runny nose   Cardiovascular:  no chest pain  Resp: cough, no SOB  GI: no vomiting, no diarrhea, GU: no dysuria, no bowel/bladder incontinence Integumentary: no rash  Allergy: no hives  Musculoskeletal: back pain, no leg swelling  Neurological: no slurred speech ROS otherwise negative  PAST MEDICAL HISTORY/PAST SURGICAL HISTORY:  Past Medical History:  Diagnosis Date  . Anemia   . Arthritis   . Cancer [facility])    prostate cancer  . Chronic total occlusion of artery of the extremities (HCC)    pt not aware of this  . Claudication ([place])   . Diabetes mellitus   . ESRD on peritoneal dialysis [facility])    Started dialysis around April 2015 per son.  Has been doing peritoneal dialysis at home.     . Gout   . Hyperlipidemia   .  Hypertension   . Overweight(278.02)   . Shingles   . Stroke ([place])   . Umbilical hernia     MEDICATIONS:  Prior to Admission medications   Medication Sig Start Date End Date Taking? Authorizing Provider  ACCU-CHEK COMPACT STRIPS test strip  05/20/10   Historical Provider, MD  acetaminophen (TYLENOL) 500 MG tablet Take 1,000 mg by mouth daily as needed for headache.    Historical Provider, MD  allopurinol (ZYLOPRIM) 100 MG tablet Take 100 mg by mouth daily.      Historical Provider, MD  aspirin EC 325 MG tablet Take 325 mg by mouth daily.    Historical Provider, MD  atorvastatin (LIPITOR) 40 MG tablet Take 40 mg by mouth daily.      Historical Provider, MD  calcitRIOL (ROCALTROL) 0.25 MCG capsule Take 0.25 mcg by mouth. Take 0.25 mcg on Mon, Wed, and Friday 03/13/14   Historical Provider, MD  diphenhydrAMINE (BENADRYL) 25 MG tablet Take 25 mg by mouth daily as needed for itching.    Historical Provider, MD  diphenoxylate-atropine (LOMOTIL) 2.5-0.025 MG per tablet TAKE 1 TABLET BY MOUTH EVERY 4 TO 6 HOURS AS NEEDED 10/09/14   Laban Emperor Zehr, PA-C  furosemide (LASIX) 80 MG tablet Take 80 mg by mouth 2 (two) times daily. 05/07/14   Historical Provider, MD  hydrALAZINE (APRESOLINE) 100 MG tablet Take 100 mg by mouth 3 (three) times daily. 05/09/14   Historical Provider, MD  metoprolol (LOPRESSOR) 100 MG tablet  Take 100 mg by mouth 2 (two) times daily.    Historical Provider, MD  multivitamin (RENA-VIT) TABS tablet Take 1 tablet by mouth daily.    Historical Provider, MD  SENSIPAR 30 MG tablet Take 30 mg by mouth daily. 12/12/14   Historical Provider, MD  sevelamer carbonate (RENVELA) 800 MG tablet Take 1,600-2,400 mg by mouth See admin instructions. Take 3 tablets (2400 mg) by mouth 3 times daily with meals and 2 tablets (1600 mg) twice daily with snacks    Historical Provider, MD    ALLERGIES:  Allergies  Allergen Reactions  . Cardura [Doxazosin Mesylate] Other (See Comments)    Hallucinations     SOCIAL HISTORY:  Social History  Substance Use Topics  . Smoking status: Never Smoker  . Smokeless tobacco: Never Used  . Alcohol use No    FAMILY HISTORY: Family History  Problem Relation Age of Onset  . Cancer Mother   . Heart disease Mother   . Cancer Father     EXAM: BP 165/93 (BP Location: Right Arm)   Pulse 88   Temp 98.9 F (37.2 C) (Oral)   Resp 16   SpO2 97%  CONSTITUTIONAL: Alert and oriented and responds appropriately to questions. Well-appearing; well-nourished HEAD: Normocephalic EYES: Conjunctivae clear, PERRL, EOMI ENT: normal nose; no rhinorrhea; moist mucous membranes NECK: Supple, no meningismus, no nuchal rigidity, no LAD  CARD: RRR; S1 and S2 appreciated; no murmurs, no clicks, no rubs, no gallops RESP: Normal chest excursion without splinting or tachypnea; breath sounds clear and equal bilaterally; no wheezes, no rhonchi, no rales, no hypoxia or respiratory distress, speaking full sentences ABD/GI: Normal bowel sounds; non-distended; soft, non-tender, no rebound, no guarding, no peritoneal signs, no hepatosplenomegaly BACK:  The back appears normal and is non-tender to palpation, there is no CVA tenderness EXT: Normal ROM in all joints; non-tender to palpation; no edema; normal capillary refill; no cyanosis, no calf tenderness or swelling; 2+ Radial and DP pulses bilaterally. SKIN: Normal color for age and race; warm; no rash; Fistula to left forearm with good thrill, no warmth, erythema or tenderness; no bleeding or drainage. NEURO: Moves all extremities equally, sensation to light touch intact diffusely, cranial nerves II through XII intact, normal speech; no saddle anesthesia, normal gait PSYCH: The patient's mood and manner are appropriate. Grooming and personal hygiene are appropriate.  MEDICAL DECISION MAKING: Patient here with what appears to be musculoskeletal back pain. He is tender to palpation over the left rhomboid area without any other  associated lesions. No midline spinal tenderness. No neurologic deficits to suggest cauda equina, spinal stenosis. No fever to suggest epidural abscess, discitis. Doubt epidural hematoma. I do not feel he has fractures or needs acute imaging. Pain is been CONTROLLED with Vicodin. He does appear to have mildly elevated potassium of 5.5 without EKG changes and some mild pulmonary edema seen on chest x-ray. This workup was obtained in triage for concerns for chest pain which patient denies. He denies feeling short of breath. He is hypertensive but reports this is chronic. Urine mild pulmonary edema and mild hyperkalemia will discuss with nephrology on call.  ED PROGRESS:    EKG Interpretation  Date/Time:  Wednesday March 25 2016 19:34:46 EST Ventricular Rate:  81 PR Interval:  160 QRS Duration: 88 QT Interval:  384 QTC Calculation: 446 R Axis:   -34 Text Interpretation:  Normal sinus rhythm Left axis deviation Abnormal ECG Confirmed by Camree Wigington,  DO, Norah Devin ([id number]) on 03/26/2016 12:03:09 AM  12:17 AM Consulted with Dr. Justin Mend in Nephrology. Discussed patient's hyperkalemia and mild pulmonary edema. He does not feel the patient meets criteria to be admitted or needs emergent dialysis. He recommends that the patient go in the morning to his dialysis center for dialysis. Discussed this with patient and he is comfortable with this plan. We'll discharge with prescription for Vicodin for pain control for his musculoskeletal back pain. I do not think that this is his anginal equivalent. He denies chest pain or shortness of breath. I do not think this is a dissection. His extremities are warm and well-perfused and his pain is reproducible with palpation of his back.  At this time, I do not feel there is any life-threatening condition present. I have reviewed and discussed all results (EKG, imaging, lab, urine as appropriate) and exam findings with patient/family. I have reviewed nursing notes and appropriate  previous records.  I feel the patient is safe to be discharged home without further emergent workup and can continue workup as an outpatient as needed. Discussed usual and customary return precautions. Patient/family verbalize understanding and are comfortable with this plan.  Outpatient follow-up has been provided. All questions have been answered.   I personally performed the services described in this documentation, which was scribed in my presence. The recorded information has been reviewed and is accurate.     [place], DO 03/26/16 [phone]

## 2016-03-25 NOTE — ED Triage Notes (Addendum)
Patient is a dialysis patient, having chest pain and upper back pain and left flank pain upon palpation and movement.  Hurts worse when he moves left.  Patient missed his dialysis today due to the pain.  No nausea or vomiting.

## 2016-03-26 MED ORDER — HYDROCODONE-ACETAMINOPHEN 5-325 MG PO TABS
2.0000 | ORAL_TABLET | Freq: Once | ORAL | Status: AC
  Administered 2016-03-26: 2 via ORAL
  Filled 2016-03-26: qty 2

## 2016-03-26 MED ORDER — SODIUM POLYSTYRENE SULFONATE 15 GM/60ML PO SUSP
15.0000 g | Freq: Once | ORAL | Status: AC
  Administered 2016-03-26: 15 g via ORAL
  Filled 2016-03-26: qty 60

## 2016-03-26 NOTE — Discharge Instructions (Signed)
You have a small amount of fluid on your lungs called pulmonary edema and mildly elevated potassium level. Because of this, you need dialysis in the morning. I have discussed her case with Dr. Justin Mend who is our on-call nephrologist to recommend that you go to your dialysis center in the morning for dialysis. He does not feel you need to be hospitalized for this or receive emergent dialysis tonight.  As for your back pain, I feel this is a muscle strain. Your lungs were clear otherwise on chest x-ray without sign of pneumonia. It does not appear you're having a heart attack. We have discharged to with Vicodin to take as needed for pain. Please do not take this medication when driving a car or take with alcohol. It may make you drowsy. Please follow-up with your primary care physician for further management of this pain.  If you ever develop chest pain, shortness of breath, numbness or weakness on one side of your body, cannot hold your bowel or bladder, cannot empty your bladder, please return to the hospital.

## 2016-03-26 NOTE — ED Notes (Signed)
Pt stable, understands discharge instructions, and reasons for return.   

## 2016-04-10 ENCOUNTER — Encounter (HOSPITAL_COMMUNITY): Payer: Self-pay | Admitting: Emergency Medicine

## 2016-04-10 ENCOUNTER — Emergency Department (HOSPITAL_COMMUNITY): Payer: Medicare Other

## 2016-04-10 ENCOUNTER — Observation Stay (HOSPITAL_COMMUNITY)
Admission: EM | Admit: 2016-04-10 | Discharge: 2016-04-12 | Disposition: A | Discharge status: Home / Self Care | Payer: Medicare Other | Attending: Internal Medicine | Admitting: Internal Medicine

## 2016-04-10 DIAGNOSIS — D638 Anemia in other chronic diseases classified elsewhere: Secondary | ICD-10-CM | POA: Insufficient documentation

## 2016-04-10 DIAGNOSIS — E1129 Type 2 diabetes mellitus with other diabetic kidney complication: Secondary | ICD-10-CM | POA: Diagnosis present

## 2016-04-10 DIAGNOSIS — I671 Cerebral aneurysm, nonruptured: Secondary | ICD-10-CM | POA: Diagnosis present

## 2016-04-10 DIAGNOSIS — I719 Aortic aneurysm of unspecified site, without rupture: Secondary | ICD-10-CM | POA: Diagnosis not present

## 2016-04-10 DIAGNOSIS — D649 Anemia, unspecified: Secondary | ICD-10-CM | POA: Diagnosis present

## 2016-04-10 DIAGNOSIS — E1122 Type 2 diabetes mellitus with diabetic chronic kidney disease: Secondary | ICD-10-CM | POA: Diagnosis not present

## 2016-04-10 DIAGNOSIS — N186 End stage renal disease: Secondary | ICD-10-CM

## 2016-04-10 DIAGNOSIS — Z6833 Body mass index (BMI) 33.0-33.9, adult: Secondary | ICD-10-CM | POA: Diagnosis not present

## 2016-04-10 DIAGNOSIS — I10 Essential (primary) hypertension: Secondary | ICD-10-CM | POA: Diagnosis present

## 2016-04-10 DIAGNOSIS — Z7982 Long term (current) use of aspirin: Secondary | ICD-10-CM | POA: Diagnosis not present

## 2016-04-10 DIAGNOSIS — R079 Chest pain, unspecified: Secondary | ICD-10-CM

## 2016-04-10 DIAGNOSIS — E1151 Type 2 diabetes mellitus with diabetic peripheral angiopathy without gangrene: Secondary | ICD-10-CM | POA: Insufficient documentation

## 2016-04-10 DIAGNOSIS — Z992 Dependence on renal dialysis: Secondary | ICD-10-CM | POA: Diagnosis not present

## 2016-04-10 DIAGNOSIS — E785 Hyperlipidemia, unspecified: Secondary | ICD-10-CM | POA: Diagnosis not present

## 2016-04-10 DIAGNOSIS — I120 Hypertensive chronic kidney disease with stage 5 chronic kidney disease or end stage renal disease: Secondary | ICD-10-CM | POA: Diagnosis not present

## 2016-04-10 DIAGNOSIS — L0232 Furuncle of buttock: Secondary | ICD-10-CM | POA: Diagnosis not present

## 2016-04-10 DIAGNOSIS — I7102 Dissection of abdominal aorta: Secondary | ICD-10-CM

## 2016-04-10 DIAGNOSIS — E669 Obesity, unspecified: Secondary | ICD-10-CM | POA: Diagnosis present

## 2016-04-10 DIAGNOSIS — Z8673 Personal history of transient ischemic attack (TIA), and cerebral infarction without residual deficits: Secondary | ICD-10-CM | POA: Diagnosis not present

## 2016-04-10 DIAGNOSIS — R55 Syncope and collapse: Secondary | ICD-10-CM | POA: Diagnosis present

## 2016-04-10 DIAGNOSIS — I959 Hypotension, unspecified: Secondary | ICD-10-CM | POA: Insufficient documentation

## 2016-04-10 DIAGNOSIS — Z8546 Personal history of malignant neoplasm of prostate: Secondary | ICD-10-CM | POA: Insufficient documentation

## 2016-04-10 DIAGNOSIS — I714 Abdominal aortic aneurysm, without rupture, unspecified: Secondary | ICD-10-CM

## 2016-04-10 HISTORY — DX: Dependence on renal dialysis: Z99.2

## 2016-04-10 HISTORY — DX: Type 2 diabetes mellitus without complications: E11.9

## 2016-04-10 HISTORY — DX: Malignant neoplasm of prostate: C61

## 2016-04-10 HISTORY — DX: End stage renal disease: N18.6

## 2016-04-10 HISTORY — DX: Blindness, one eye, unspecified eye: H54.40

## 2016-04-10 LAB — CBC WITH DIFFERENTIAL/PLATELET
Basophils Absolute: 0.1 10*3/uL (ref 0.0–0.1)
Basophils Relative: 1 %
Eosinophils Absolute: 0.2 10*3/uL (ref 0.0–0.7)
Eosinophils Relative: 2 %
HEMATOCRIT: 30.4 % — AB (ref 39.0–52.0)
Hemoglobin: 9.5 g/dL — ABNORMAL LOW (ref 13.0–17.0)
LYMPHS ABS: 2.5 10*3/uL (ref 0.7–4.0)
Lymphocytes Relative: 24 %
MCH: 29.3 pg (ref 26.0–34.0)
MCHC: 31.3 g/dL (ref 30.0–36.0)
MCV: 93.8 fL (ref 78.0–100.0)
MONO ABS: 0.8 10*3/uL (ref 0.1–1.0)
MONOS PCT: 7 %
NEUTROS ABS: 7 10*3/uL (ref 1.7–7.7)
Neutrophils Relative %: 66 %
Platelets: 287 10*3/uL (ref 150–400)
RBC: 3.24 MIL/uL — ABNORMAL LOW (ref 4.22–5.81)
RDW: 17 % — ABNORMAL HIGH (ref 11.5–15.5)
WBC: 10.6 10*3/uL — ABNORMAL HIGH (ref 4.0–10.5)

## 2016-04-10 LAB — COMPREHENSIVE METABOLIC PANEL
ALK PHOS: 51 U/L (ref 38–126)
ALT: 12 U/L — ABNORMAL LOW (ref 17–63)
ANION GAP: 14 (ref 5–15)
AST: 16 U/L (ref 15–41)
Albumin: 3 g/dL — ABNORMAL LOW (ref 3.5–5.0)
BILIRUBIN TOTAL: 0.4 mg/dL (ref 0.3–1.2)
BUN: 23 mg/dL — ABNORMAL HIGH (ref 6–20)
CALCIUM: 8.6 mg/dL — AB (ref 8.9–10.3)
CO2: 30 mmol/L (ref 22–32)
Chloride: 95 mmol/L — ABNORMAL LOW (ref 101–111)
Creatinine, Ser: 7.21 mg/dL — ABNORMAL HIGH (ref 0.61–1.24)
GFR calc Af Amer: 7 mL/min — ABNORMAL LOW (ref >60)
GFR, EST NON AFRICAN AMERICAN: 6 mL/min — AB (ref >60)
Glucose, Bld: 145 mg/dL — ABNORMAL HIGH (ref 65–99)
Potassium: 4.1 mmol/L (ref 3.5–5.1)
Sodium: 139 mmol/L (ref 135–145)
TOTAL PROTEIN: 6.4 g/dL — AB (ref 6.5–8.1)

## 2016-04-10 LAB — I-STAT TROPONIN, ED: TROPONIN I, POC: 0.02 ng/mL (ref 0.00–0.08)

## 2016-04-10 LAB — GLUCOSE, CAPILLARY: Glucose-Capillary: 81 mg/dL (ref 65–99)

## 2016-04-10 LAB — MRSA PCR SCREENING: MRSA BY PCR: NEGATIVE

## 2016-04-10 MED ORDER — SEVELAMER CARBONATE 800 MG PO TABS
2400.0000 mg | ORAL_TABLET | Freq: Three times a day (TID) | ORAL | Status: DC
  Administered 2016-04-11 – 2016-04-12 (×3): 2400 mg via ORAL
  Filled 2016-04-10 (×3): qty 3

## 2016-04-10 MED ORDER — ALLOPURINOL 100 MG PO TABS
100.0000 mg | ORAL_TABLET | Freq: Every day | ORAL | Status: DC
  Administered 2016-04-11 – 2016-04-12 (×2): 100 mg via ORAL
  Filled 2016-04-10 (×2): qty 1

## 2016-04-10 MED ORDER — METOPROLOL TARTRATE 100 MG PO TABS
100.0000 mg | ORAL_TABLET | Freq: Two times a day (BID) | ORAL | Status: DC
  Administered 2016-04-10 – 2016-04-12 (×4): 100 mg via ORAL
  Filled 2016-04-10 (×4): qty 1

## 2016-04-10 MED ORDER — ACETAMINOPHEN 500 MG PO TABS
1000.0000 mg | ORAL_TABLET | Freq: Every day | ORAL | Status: DC | PRN
  Administered 2016-04-12: 1000 mg via ORAL
  Filled 2016-04-10: qty 2

## 2016-04-10 MED ORDER — CINACALCET HCL 30 MG PO TABS
30.0000 mg | ORAL_TABLET | Freq: Every day | ORAL | Status: DC
  Administered 2016-04-12: 30 mg via ORAL
  Filled 2016-04-10 (×2): qty 1

## 2016-04-10 MED ORDER — MORPHINE SULFATE (PF) 2 MG/ML IV SOLN: 2.0000 mg | INTRAVENOUS | Status: DC | PRN

## 2016-04-10 MED ORDER — SEVELAMER CARBONATE 800 MG PO TABS: 1600.0000 mg | ORAL_TABLET | ORAL | Status: DC | PRN

## 2016-04-10 MED ORDER — ATORVASTATIN CALCIUM 40 MG PO TABS
40.0000 mg | ORAL_TABLET | Freq: Every day | ORAL | Status: DC
  Administered 2016-04-11 – 2016-04-12 (×2): 40 mg via ORAL
  Filled 2016-04-10 (×2): qty 1

## 2016-04-10 MED ORDER — GI COCKTAIL ~~LOC~~: 30.0000 mL | Freq: Four times a day (QID) | ORAL | Status: DC | PRN

## 2016-04-10 MED ORDER — ACETAMINOPHEN 325 MG PO TABS: 650.0000 mg | ORAL_TABLET | ORAL | Status: DC | PRN

## 2016-04-10 MED ORDER — ASPIRIN EC 325 MG PO TBEC
325.0000 mg | DELAYED_RELEASE_TABLET | Freq: Every day | ORAL | Status: DC
  Administered 2016-04-11 – 2016-04-12 (×2): 325 mg via ORAL
  Filled 2016-04-10 (×2): qty 1

## 2016-04-10 MED ORDER — ONDANSETRON HCL 4 MG/2ML IJ SOLN: 4.0000 mg | Freq: Four times a day (QID) | INTRAMUSCULAR | Status: DC | PRN

## 2016-04-10 MED ORDER — IOPAMIDOL (ISOVUE-370) INJECTION 76%
INTRAVENOUS | Status: AC
  Administered 2016-04-10: 100 mL
  Filled 2016-04-10: qty 100

## 2016-04-10 MED ORDER — RENA-VITE PO TABS
1.0000 | ORAL_TABLET | Freq: Every day | ORAL | Status: DC
  Administered 2016-04-11 – 2016-04-12 (×2): 1 via ORAL
  Filled 2016-04-10 (×2): qty 1

## 2016-04-10 MED ORDER — CALCITRIOL 0.25 MCG PO CAPS: 0.2500 ug | ORAL_CAPSULE | ORAL | Status: DC

## 2016-04-10 NOTE — H&P (Signed)
History and Physical    Sean Best ZCH:[id number] DOB: 1933-10-11 DOA: 04/10/2016  Referring MD/NP/PA: EDP PCP: [name], MD  Outpatient Specialists:  Patient coming from: Home Chief Complaint: Chest pains  HPI: Sean Best is a 83 y.o. male with medical history significant for but not limited to history of hypertension, hyperlipidemia, previous CVA peripheral muscular disease and end-stage renal disease on hemodialysis on Mondays Wednesdays and Fridays.  He presented to [facility] ED from hemodialysis on account of left-sided chest pain which was radiating to his left lower chest (denies radiation to his back) without any associated shortness of breath, diaphoresis, fever or chills, cough or sputum production. Patient's currently chest pain-free but described the chest pains 7-8/10 in intensity, sharp lasting for several minutes. Patient denies episode of loss of consciousness or presyncopal episode, but apparently there was concern by the assistant has patient might have passed out or had a seizure - specific historical documentation not available for my review in this regard. Patient denies any bowel or bladder incontinence.  ED Course: At emergency room, twelve-lead EKG was negative for any acute ST-T wave changes, chest x-ray indicated stable cardiomegaly. He had CT angiogram of abdomen and pelvis which indicated saccular aneurysm of infrarenal aorta with thrombosed dissection of the abdominal aorta, with incidental bilateral renal cystic changes.  Dr. Donzetta Matters of vascular surgery was consulted, and he felt that this was chronic but agreed to see the patient tomorrow in the hospital.  Patient is admitted for further management  Review of Systems: As per HPI otherwise 10 point review of systems negative.   Past Medical History:  Diagnosis Date  . Anemia   . Arthritis   . Cancer [facility])    prostate cancer  . Chronic total occlusion of artery of the extremities (HCC)    pt not aware of  this  . Claudication ([place])   . Diabetes mellitus   . ESRD on peritoneal dialysis [facility])    Started dialysis around April 2015 per son.  Has been doing peritoneal dialysis at home.     . Gout   . Hyperlipidemia   . Hypertension   . Overweight(278.02)   . Shingles   . Stroke ([place])   . Umbilical hernia     Past Surgical History:  Procedure Laterality Date  . AV FISTULA PLACEMENT, RADIOCEPHALIC  28/78/6767   Left arm  . CAPD INSERTION N/A 03/06/2013   Procedure: LAPAROSCOPIC INSERTION CONTINUOUS AMBULATORY PERITONEAL DIALYSIS  (CAPD) CATHETER;  Surgeon: Edward Jolly, MD;  Location: [place];  Service: General;  Laterality: N/A;  . EYE SURGERY Left    cataract surgery  . EYE SURGERY Left    for eye injury  . PROSTATE SURGERY     prostate seed placement  . UMBILICAL HERNIA REPAIR N/A 03/06/2013   Procedure: HERNIA REPAIR UMBILICAL WITH MESH;  Surgeon: Edward Jolly, MD;  Location: [place];  Service: General;  Laterality: N/A;     reports that he has never smoked. He has never used smokeless tobacco. He reports that he does not drink alcohol or use drugs.  Allergies  Allergen Reactions  . Cardura [Doxazosin Mesylate] Other (See Comments)    Hallucinations    Family History  Problem Relation Age of Onset  . Cancer Mother   . Heart disease Mother   . Cancer Father      Prior to Admission medications   Medication Sig Start Date End Date Taking? Authorizing Provider  ACCU-CHEK COMPACT STRIPS test strip  05/20/10  Yes Historical Provider, MD  acetaminophen (TYLENOL) 500 MG tablet Take 1,000 mg by mouth daily as needed for headache.   Yes Historical Provider, MD  allopurinol (ZYLOPRIM) 100 MG tablet Take 100 mg by mouth daily.     Yes Historical Provider, MD  aspirin EC 325 MG tablet Take 325 mg by mouth daily.   Yes Historical Provider, MD  atorvastatin (LIPITOR) 40 MG tablet Take 40 mg by mouth daily.     Yes Historical Provider, MD  calcitRIOL (ROCALTROL) 0.25 MCG capsule  Take 0.25 mcg by mouth. Take 0.25 mcg on Mon, Wed, and Friday 03/13/14  Yes Historical Provider, MD  metoprolol (LOPRESSOR) 100 MG tablet Take 100 mg by mouth 2 (two) times daily.   Yes Historical Provider, MD  multivitamin (RENA-VIT) TABS tablet Take 1 tablet by mouth daily.   Yes Historical Provider, MD  SENSIPAR 30 MG tablet Take 30 mg by mouth daily. 12/12/14  Yes Historical Provider, MD  sevelamer carbonate (RENVELA) 800 MG tablet Take 1,600-2,400 mg by mouth See admin instructions. Take 3 tablets (2400 mg) by mouth 3 times daily with meals and 2 tablets (1600 mg) twice daily with snacks   Yes Historical Provider, MD  diphenoxylate-atropine (LOMOTIL) 2.5-0.025 MG per tablet TAKE 1 TABLET BY MOUTH EVERY 4 TO 6 HOURS AS NEEDED Patient not taking: Reported on 04/10/2016 10/09/14   Laban Emperor Zehr, PA-C    Physical Exam: Vitals:   04/10/16 1645 04/10/16 1700 04/10/16 1845 04/10/16 2043  BP: 147/88 147/83 158/89 (!) 157/90  Pulse: 81 79 72 71  Resp: 14 18 18 18   Temp:    98.5 F (36.9 C)  TempSrc:    Oral  SpO2: 99% 99% 100% 100%  Weight:    91.5 kg (201 lb 11.5 oz)  Height:    5\' 5"  (1.651 m)      Constitutional: NAD, calm, comfortable Vitals:   04/10/16 1645 04/10/16 1700 04/10/16 1845 04/10/16 2043  BP: 147/88 147/83 158/89 (!) 157/90  Pulse: 81 79 72 71  Resp: 14 18 18 18   Temp:    98.5 F (36.9 C)  TempSrc:    Oral  SpO2: 99% 99% 100% 100%  Weight:    91.5 kg (201 lb 11.5 oz)  Height:    5\' 5"  (1.651 m)   Eyes: PERRL, lids and conjunctivae normal ENMT: Mucous membranes are moist. Posterior pharynx clear of any exudate or lesions.Normal dentition.  Neck: normal, supple, no masses, no thyromegaly Respiratory: clear to auscultation bilaterally, no wheezing, no crackles. Normal respiratory effort. No accessory muscle use.  Cardiovascular: Regular rate and rhythm, no murmurs / rubs / gallops. No extremity edema. 2+ pedal pulses. No carotid bruits.  Abdomen: no tenderness, no  masses palpated. No hepatosplenomegaly. Bowel sounds positive.  Musculoskeletal: no clubbing / cyanosis.Left forearm dialysis graft  Skin: no rashes, lesions, ulcers. No induration Neurologic: CN 2-12 grossly intact. Sensation intact, DTR normal. Strength 5/5 in all 4.  Psychiatric: Normal judgment and insight. Alert and oriented x 3. Normal mood.     Labs on Admission: I have personally reviewed following labs and imaging studies  CBC:  Recent Labs Lab 04/10/16 1435  WBC 10.6*  NEUTROABS 7.0  HGB 9.5*  HCT 30.4*  MCV 93.8  PLT 202   Basic Metabolic Panel:  Recent Labs Lab 04/10/16 1435  NA 139  K 4.1  CL 95*  CO2 30  GLUCOSE 145*  BUN 23*  CREATININE 7.21*  CALCIUM 8.6*  GFR: Estimated Creatinine Clearance: 8.5 mL/min (by C-G formula based on SCr of 7.21 mg/dL (H)). Liver Function Tests:  Recent Labs Lab 04/10/16 1435  AST 16  ALT 12*  ALKPHOS 51  BILITOT 0.4  PROT 6.4*  ALBUMIN 3.0*   No results for input(s): LIPASE, AMYLASE in the last 168 hours. No results for input(s): AMMONIA in the last 168 hours. Coagulation Profile: No results for input(s): INR, PROTIME in the last 168 hours. Cardiac Enzymes:  Recent Labs Lab 04/10/16 1435  TROPONINI CRITICAL RESULT CALLED TO, READ BACK BY AND VERIFIED WITH:   BNP (last 3 results) No results for input(s): PROBNP in the last 8760 hours. HbA1C: No results for input(s): HGBA1C in the last 72 hours. CBG: No results for input(s): GLUCAP in the last 168 hours. Lipid Profile: No results for input(s): CHOL, HDL, LDLCALC, TRIG, CHOLHDL, LDLDIRECT in the last 72 hours. Thyroid Function Tests: No results for input(s): TSH, T4TOTAL, FREET4, T3FREE, THYROIDAB in the last 72 hours. Anemia Panel: No results for input(s): VITAMINB12, FOLATE, FERRITIN, TIBC, IRON, RETICCTPCT in the last 72 hours. Urine analysis:    Component Value Date/Time   COLORURINE YELLOW 12/29/2014 0550   APPEARANCEUR CLOUDY (A) 12/29/2014  0550   LABSPEC 1.020 12/29/2014 0550   PHURINE 5.0 12/29/2014 0550   GLUCOSEU NEGATIVE 12/29/2014 0550   HGBUR SMALL (A) 12/29/2014 0550   BILIRUBINUR NEGATIVE 12/29/2014 0550   KETONESUR NEGATIVE 12/29/2014 0550   PROTEINUR 100 (A) 12/29/2014 0550   UROBILINOGEN 0.2 12/29/2014 0550   NITRITE NEGATIVE 12/29/2014 0550   LEUKOCYTESUR SMALL (A) 12/29/2014 0550   Sepsis Labs: @LABRCNTIP (procalcitonin:4,lacticidven:4) )No results found for this or any previous visit (from the past 240 hour(s)).   Radiological Exams on Admission: Dg Chest Portable 1 View  Result Date: 04/10/2016 CLINICAL DATA:  83 year old male with chest pain, diaphoresis, shortness of Breath, syncope. Hypotensive (88 systolic). Initial encounter. EXAM: PORTABLE CHEST 1 VIEW COMPARISON:  03/25/2016 and earlier. FINDINGS: Portable AP upright view at 1448 hours Stable cardiomegaly and mediastinal contours. Allowing for portable technique the lungs are clear. No pneumothorax or pleural effusion. IMPRESSION: Stable cardiomegaly. No acute cardiopulmonary abnormality. Electronically Signed   By: Genevie Ann M.D.   On: 04/10/2016 14:58   Ct Angio Chest/abd/pel For Dissection W And/or W/wo  Result Date: 04/10/2016 CLINICAL DATA:  Left-sided chest pain during dialysis with shortness of Breath EXAM: CT ANGIOGRAPHY CHEST, ABDOMEN AND PELVIS TECHNIQUE: Multidetector CT imaging through the chest, abdomen and pelvis was performed using the standard protocol during bolus administration of intravenous contrast. Multiplanar reconstructed images and MIPs were obtained and reviewed to evaluate the vascular anatomy. CONTRAST:  100 mL Isovue 370. COMPARISON:  Chest x-ray from earlier in the same day FINDINGS: CTA CHEST FINDINGS Cardiovascular: Heavy coronary calcifications are noted. Aortic atherosclerotic changes are noted without aneurysmal dilatation or dissection. The cardiac structures are not significantly enlarged. The pulmonary artery shows no  evidence of pulmonary emboli. Mediastinum/Nodes: No significant hilar or mediastinal adenopathy is noted. The thoracic inlet is within normal limits. No axillary adenopathy is seen. Lungs/Pleura: Lungs are clear. No pleural effusion or pneumothorax. Musculoskeletal: Degenerative changes of the thoracic spine are noted. At T6-7 there is significant endplate irregularity and mild perispinal inflammatory changes. Review of the MIP images confirms the above findings. CTA ABDOMEN AND PELVIS FINDINGS VASCULAR Aorta: Focal saccular aneurysmal dilatation is noted to 3.8 cm. Changes of prior dissection with thrombosis are seen in the abdominal aorta just below the origin of the SMA. No  other findings to suggest acute dissection are seen. Celiac: Patent without evidence of aneurysm, dissection, vasculitis or significant stenosis. SMA: Patent without evidence of aneurysm, dissection, vasculitis or significant stenosis. Renals: Single renal arteries are identified bilaterally with atherosclerotic change. No focal high-grade stenosis is noted. IMA: Widely patent Iliacs: Diffuse atherosclerotic calcifications are noted without aneurysmal dilatation. Veins: No acute abnormality of the venous structures is noted. Review of the MIP images confirms the above findings. NON-VASCULAR Hepatobiliary: No focal liver abnormality is seen. No gallstones, gallbladder wall thickening, or biliary dilatation. Pancreas: Unremarkable. No pancreatic ductal dilatation or surrounding inflammatory changes. Spleen: Normal in size without focal abnormality. Adrenals/Urinary Tract: The adrenal glands are within normal limits. Bilateral renal cystic changes are seen. Some scattered wall calcifications are noted within the renal cysts. The largest of these lesions is on the left measuring 3.5 cm. Stomach/Bowel: No obstructive changes or inflammatory changes are seen. The appendix is within normal limits. Lymphatic: No specific lymphadenopathy is noted.  Reproductive: Multiple prostate brachyTherapy seeds are noted. Other: No abdominal wall hernia or abnormality. No abdominopelvic ascites. Musculoskeletal: Degenerative changes of the lumbar spine are noted. Review of the MIP images confirms the above findings. IMPRESSION: Significant degenerative changes in the thoracic and lumbar spine. Saccular aneurysm of the infrarenal aorta as described. Thrombosed dissection of the abdominal aorta and just below the level of the superior mesenteric artery. No acute dissection is noted. Bilateral renal cystic changes likely of a chronic nature. The largest of these measures 3.5 cm and demonstrates some calcification within. Need for further evaluation can be determined on a clinical basis. This would be best achieved with MRI. Chronic changes as described above. Electronically Signed   By: Inez Catalina M.D.   On: 04/10/2016 17:47    EKG: Independently reviewed. No acute ST-T wave changes  Assessment/Plan Active Problems:   Chest pain   #1 chest pain:  Atypical Serial cardiac enzymes, 2-D echocardiogram, Supportive care.   #2 syncope, questionable:  History of seizure versus syncope per HD nursing report - unable to clarify this time. Patient denies having passed out at [place] reportedly,  Unclear history / duration Check EEG. May be related to vasovagal reaction to diagnosis #1  #3 Aortic Aneurysm with Thrombosed Dissection:  Felt to be not acute Vascular surgery follow-up    DVT prophylaxis: SCD  Code Status: (Full) Family Communication:   Disposition Plan: (Home) Consults called:  Admission status:  obs / tele   Best,Sean Lightsey MD [facility] Pager [phone] If 7PM-7AM, please contact night-coverage [email or web address] Password [facility]  04/10/2016, 9:33 PM

## 2016-04-10 NOTE — Progress Notes (Signed)
Pt admitted to room 2W18 alert and oriented X4. Oriented room and unit. No c/o pain and discomfort at this time. Wound on right hip, dry dressing placed. Will continue to monitor.

## 2016-04-10 NOTE — ED Triage Notes (Signed)
Pt to hospital via EMS from dialysis center with left sided chest pain and neck that began an hour after starting dialysis treatment. Pt became diaphoretic and SOB with the pain. RN noticed some seizure like activity as well. Systolic bp noted to be 88 at the time. Given 500cc bolus. Repeat bp 767 systolic. During transfer from bed to stretcher pt had syncopal episode lasting approx 30sec with tremors of upper extremites. On arrival pt awake, alert, oriented x4, EKG NSR with occasional PVC's, VSS, pt on RA. Rating pain 6/10 in chest.

## 2016-04-10 NOTE — ED Provider Notes (Signed)
[facility] DEPT Provider Note   CSN: [id number] Arrival date & time: 04/10/16  1400     History   Chief Complaint Chief Complaint  Patient presents with  . Chest Pain  . Loss of Consciousness    HPI Sean Best is a 83 y.o. male.  HPI Patient was on dialysis today when he began to have pain in his left chest going to his left jaw. It was dull. States that he then passed out. Pain was on his left chest. Has not really had pain like this before but a few weeks ago to have some pain in the back. No fevers or chills. No real shortness of breath. Reportedly had some shaking with the episode. No known cardiac history. Slight dull pain now but not pain like he had before. States the pain was severe while he was there. No swelling in his legs.  Past Medical History:  Diagnosis Date  . Anemia   . Arthritis    "knees" (04/11/2016)  . Blind left eye    S/P trauma  . Chronic total occlusion of artery of the extremities (HCC)    pt not aware of this  . Claudication ([place])   . ESRD on dialysis [facility])    "MWF; [name]" ((04/10/2016)  . ESRD on peritoneal dialysis [facility])    Started dialysis around April 2015 per son.  Has been doing peritoneal dialysis at home.     . Gout   . Hyperlipidemia   . Hypertension   . Overweight(278.02)   . Prostate cancer ([place]) 1990s  . Shingles   . Stroke [facility]) ~1998   denies residual on 04/11/2016  . Type II diabetes mellitus [facility])     Patient Active Problem List   Diagnosis Date Noted  . ESRD on hemodialysis ([place]) 04/12/2016  . H/O: stroke 04/11/2016  . Saccular aneurysm of infrarenal aorta with thrombosed dissection of abdominal aorta 04/11/2016  . Obesity (BMI 30-39.9) 04/11/2016  . Syncope 04/11/2016  . Prostate CA ([place]) 12/28/2014  . Chest pain 12/28/2014  . Anemia of chronic disease 03/11/2013  . DM (diabetes mellitus), type 2 with renal complications, diet controlled 03/11/2013  . Benign hypertension 03/11/2013  . Hyperlipidemia  03/11/2013  . Umbilical hernia 12/24/8525    Past Surgical History:  Procedure Laterality Date  . AV FISTULA PLACEMENT, RADIOCEPHALIC  78/24/2353   Left arm  . CAPD INSERTION N/A 03/06/2013   Procedure: LAPAROSCOPIC INSERTION CONTINUOUS AMBULATORY PERITONEAL DIALYSIS  (CAPD) CATHETER;  Surgeon: Edward Jolly, MD;  Location: [place];  Service: General;  Laterality: N/A;  . CATARACT EXTRACTION Right   . EYE SURGERY Left 1970s   for eye injury  . HERNIA REPAIR    . INSERTION PROSTATE RADIATION SEED    . UMBILICAL HERNIA REPAIR N/A 03/06/2013   Procedure: HERNIA REPAIR UMBILICAL WITH MESH;  Surgeon: Edward Jolly, MD;  Location: [facility] OR;  Service: General;  Laterality: N/A;       Home Medications    Prior to Admission medications   Medication Sig Start Date End Date Taking? Authorizing Provider  ACCU-CHEK COMPACT STRIPS test strip  05/20/10  Yes Historical Provider, MD  acetaminophen (TYLENOL) 500 MG tablet Take 1,000 mg by mouth daily as needed for headache.   Yes Historical Provider, MD  allopurinol (ZYLOPRIM) 100 MG tablet Take 100 mg by mouth daily.     Yes Historical Provider, MD  aspirin EC 325 MG tablet Take 325 mg by mouth daily.   Yes Historical  Provider, MD  atorvastatin (LIPITOR) 40 MG tablet Take 40 mg by mouth daily.     Yes Historical Provider, MD  calcitRIOL (ROCALTROL) 0.25 MCG capsule Take 0.25 mcg by mouth. Take 0.25 mcg on Mon, Wed, and Friday 03/13/14  Yes Historical Provider, MD  metoprolol (LOPRESSOR) 100 MG tablet Take 100 mg by mouth 2 (two) times daily.   Yes Historical Provider, MD  multivitamin (RENA-VIT) TABS tablet Take 1 tablet by mouth daily.   Yes Historical Provider, MD  SENSIPAR 30 MG tablet Take 30 mg by mouth daily. 12/12/14  Yes Historical Provider, MD  sevelamer carbonate (RENVELA) 800 MG tablet Take 1,600-2,400 mg by mouth See admin instructions. Take 3 tablets (2400 mg) by mouth 3 times daily with meals and 2 tablets (1600 mg) twice daily with  snacks   Yes Historical Provider, MD  diphenoxylate-atropine (LOMOTIL) 2.5-0.025 MG per tablet TAKE 1 TABLET BY MOUTH EVERY 4 TO 6 HOURS AS NEEDED Patient not taking: Reported on 04/10/2016 10/09/14   Janett Billow D Zehr, PA-C  doxycycline (VIBRA-TABS) 100 MG tablet Take 1 tablet (100 mg total) by mouth every 12 (twelve) hours. 04/12/16   Venetia Maxon Rama, MD    Family History Family History  Problem Relation Age of Onset  . Cancer Mother   . Heart disease Mother   . Cancer Father     Social History Social History  Substance Use Topics  . Smoking status: Never Smoker  . Smokeless tobacco: Never Used  . Alcohol use No     Allergies   Cardura [doxazosin mesylate]   Review of Systems Review of Systems  Constitutional: Negative for appetite change.  HENT: Negative for congestion.   Respiratory: Negative for choking.   Cardiovascular: Positive for chest pain.  Gastrointestinal: Negative for abdominal pain.  Endocrine: Negative for polyphagia.  Genitourinary: Negative for flank pain.  Musculoskeletal: Negative for back pain.  Neurological: Positive for syncope.  Hematological: Negative for adenopathy.  Psychiatric/Behavioral: Negative for confusion.     Physical Exam Updated Vital Signs BP (!) 162/87 (BP Location: Right Arm)   Pulse 75   Temp 98.5 F (36.9 C) (Oral)   Resp 16   Ht 5\' 5"  (1.651 m)   Wt 201 lb 11.5 oz (91.5 kg)   SpO2 98%   BMI 33.57 kg/m   Physical Exam  Constitutional: He appears well-developed.  HENT:  Head: Normocephalic.  Eyes: EOM are normal.  Neck: Neck supple.  Cardiovascular: Normal rate.   Pulmonary/Chest: Effort normal.  Abdominal: Soft. There is no tenderness.  Musculoskeletal: He exhibits no edema or tenderness.  Left forearm dialysis graft still accessed.  Neurological: He is alert.  Skin: Skin is warm. Capillary refill takes less than 2 seconds.     ED Treatments / Results  Labs (all labs ordered are listed, but only abnormal  results are displayed) Labs Reviewed  CBC WITH DIFFERENTIAL/PLATELET - Abnormal; Notable for the following:       Result Value   WBC 10.6 (*)    RBC 3.24 (*)    Hemoglobin 9.5 (*)    HCT 30.4 (*)    RDW 17.0 (*)    All other components within normal limits  COMPREHENSIVE METABOLIC PANEL - Abnormal; Notable for the following:    Chloride 95 (*)    Glucose, Bld 145 (*)    BUN 23 (*)    Creatinine, Ser 7.21 (*)    Calcium 8.6 (*)    Total Protein 6.4 (*)    Albumin  3.0 (*)    ALT 12 (*)    GFR calc non Af Amer 6 (*)    GFR calc Af Amer 7 (*)    All other components within normal limits  TROPONIN I - Abnormal; Notable for the following:    Troponin I 0.03 (*)    All other components within normal limits  TROPONIN I - Abnormal; Notable for the following:    Troponin I 0.03 (*)    All other components within normal limits  TROPONIN I - Abnormal; Notable for the following:    Troponin I 0.03 (*)    All other components within normal limits  TROPONIN I - Abnormal; Notable for the following:    Troponin I 0.04 (*)    All other components within normal limits  RENAL FUNCTION PANEL - Abnormal; Notable for the following:    Chloride 93 (*)    BUN 40 (*)    Creatinine, Ser 10.35 (*)    Calcium 8.8 (*)    Phosphorus 6.0 (*)    Albumin 2.7 (*)    GFR calc non Af Amer 4 (*)    GFR calc Af Amer 5 (*)    All other components within normal limits  MRSA PCR SCREENING  GLUCOSE, CAPILLARY  HEMOGLOBIN A1C  I-STAT TROPOININ, ED    EKG  EKG Interpretation  Date/Time:  Friday April 10 2016 14:11:13 EST Ventricular Rate:  77 PR Interval:    QRS Duration: 97 QT Interval:  419 QTC Calculation: 475 R Axis:   -22 Text Interpretation:  Sinus rhythm Atrial premature complexes Borderline left axis deviation Confirmed by Alvino Chapel  MD, Ovid Curd [phone]) on 04/10/2016 2:24:45 PM       Radiology Ct Angio Chest/abd/pel For Dissection W And/or W/wo  Result Date: 04/10/2016 CLINICAL DATA:   Left-sided chest pain during dialysis with shortness of Breath EXAM: CT ANGIOGRAPHY CHEST, ABDOMEN AND PELVIS TECHNIQUE: Multidetector CT imaging through the chest, abdomen and pelvis was performed using the standard protocol during bolus administration of intravenous contrast. Multiplanar reconstructed images and MIPs were obtained and reviewed to evaluate the vascular anatomy. CONTRAST:  100 mL Isovue 370. COMPARISON:  Chest x-ray from earlier in the same day FINDINGS: CTA CHEST FINDINGS Cardiovascular: Heavy coronary calcifications are noted. Aortic atherosclerotic changes are noted without aneurysmal dilatation or dissection. The cardiac structures are not significantly enlarged. The pulmonary artery shows no evidence of pulmonary emboli. Mediastinum/Nodes: No significant hilar or mediastinal adenopathy is noted. The thoracic inlet is within normal limits. No axillary adenopathy is seen. Lungs/Pleura: Lungs are clear. No pleural effusion or pneumothorax. Musculoskeletal: Degenerative changes of the thoracic spine are noted. At T6-7 there is significant endplate irregularity and mild perispinal inflammatory changes. Review of the MIP images confirms the above findings. CTA ABDOMEN AND PELVIS FINDINGS VASCULAR Aorta: Focal saccular aneurysmal dilatation is noted to 3.8 cm. Changes of prior dissection with thrombosis are seen in the abdominal aorta just below the origin of the SMA. No other findings to suggest acute dissection are seen. Celiac: Patent without evidence of aneurysm, dissection, vasculitis or significant stenosis. SMA: Patent without evidence of aneurysm, dissection, vasculitis or significant stenosis. Renals: Single renal arteries are identified bilaterally with atherosclerotic change. No focal high-grade stenosis is noted. IMA: Widely patent Iliacs: Diffuse atherosclerotic calcifications are noted without aneurysmal dilatation. Veins: No acute abnormality of the venous structures is noted. Review of  the MIP images confirms the above findings. NON-VASCULAR Hepatobiliary: No focal liver abnormality is seen. No gallstones, gallbladder  wall thickening, or biliary dilatation. Pancreas: Unremarkable. No pancreatic ductal dilatation or surrounding inflammatory changes. Spleen: Normal in size without focal abnormality. Adrenals/Urinary Tract: The adrenal glands are within normal limits. Bilateral renal cystic changes are seen. Some scattered wall calcifications are noted within the renal cysts. The largest of these lesions is on the left measuring 3.5 cm. Stomach/Bowel: No obstructive changes or inflammatory changes are seen. The appendix is within normal limits. Lymphatic: No specific lymphadenopathy is noted. Reproductive: Multiple prostate brachyTherapy seeds are noted. Other: No abdominal wall hernia or abnormality. No abdominopelvic ascites. Musculoskeletal: Degenerative changes of the lumbar spine are noted. Review of the MIP images confirms the above findings. IMPRESSION: Significant degenerative changes in the thoracic and lumbar spine. Saccular aneurysm of the infrarenal aorta as described. Thrombosed dissection of the abdominal aorta and just below the level of the superior mesenteric artery. No acute dissection is noted. Bilateral renal cystic changes likely of a chronic nature. The largest of these measures 3.5 cm and demonstrates some calcification within. Need for further evaluation can be determined on a clinical basis. This would be best achieved with MRI. Chronic changes as described above. Electronically Signed   By: Inez Catalina M.D.   On: 04/10/2016 17:47    Procedures Procedures (including critical care time)  Medications Ordered in ED Medications  cinacalcet (SENSIPAR) tablet 30 mg (30 mg Oral Given 04/12/16 0801)  acetaminophen (TYLENOL) tablet 1,000 mg (1,000 mg Oral Given 04/12/16 0612)  calcitRIOL (ROCALTROL) capsule 0.25 mcg (not administered)  aspirin EC tablet 325 mg (325 mg Oral  Given 04/12/16 0904)  sevelamer carbonate (RENVELA) tablet 2,400 mg (2,400 mg Oral Given 04/12/16 1157)  multivitamin (RENA-VIT) tablet 1 tablet (1 tablet Oral Given 04/12/16 0903)  metoprolol (LOPRESSOR) tablet 100 mg (100 mg Oral Given 04/12/16 0904)  allopurinol (ZYLOPRIM) tablet 100 mg (100 mg Oral Given 04/12/16 0904)  atorvastatin (LIPITOR) tablet 40 mg (40 mg Oral Given 04/12/16 0904)  acetaminophen (TYLENOL) tablet 650 mg (not administered)  ondansetron (ZOFRAN) injection 4 mg (not administered)  morphine 2 MG/ML injection 2 mg (not administered)  gi cocktail (Maalox,Lidocaine,Donnatal) (not administered)  sevelamer carbonate (RENVELA) tablet 1,600 mg (not administered)  doxycycline (VIBRA-TABS) tablet 100 mg (100 mg Oral Given 04/12/16 0904)  iopamidol (ISOVUE-370) 76 % injection (100 mLs  Contrast Given 04/10/16 1718)     Initial Impression / Assessment and Plan / ED Course  I have reviewed the triage vital signs and the nursing notes.  Pertinent labs & imaging results that were available during my care of the patient were reviewed by me and considered in my medical decision making (see chart for details).     Patient with chest pain. Upper chest. Occurred at dialysis. Also has back pain. CT angiography to be done. Hypotension at dialysis. If CT is negative required admission for chest pain.  Final Clinical Impressions(s) / ED Diagnoses   Final diagnoses:  Syncope and collapse  Abdominal aortic aneurysm (AAA) without rupture [facility])    New Prescriptions Discharge Medication List as of 04/12/2016 12:01 PM    START taking these medications   Details  doxycycline (VIBRA-TABS) 100 MG tablet Take 1 tablet (100 mg total) by mouth every 12 (twelve) hours., Starting Sun 04/12/2016, Normal         Davonna Belling, MD 04/12/16 1531

## 2016-04-10 NOTE — ED Provider Notes (Signed)
  Physical Exam  BP 147/83   Pulse 79   Temp 97.6 F (36.4 C) (Oral)   Resp 18   SpO2 99%   Physical Exam  ED Course  Procedures  MDM Care assumed at 4 pm from Dr. Alvino Chapel. Patient was at dialysis and had sudden onset chest pain, shortness of breath, back pain, diaphoresis. ? Syncope vs seizure as well. Alert and oriented in the ED. Sign out pending dissection study and admission for r/o ACS. CT showed old thrombosed dissection just below SMA. I called Dr. Donzetta Matters from vascular, who will see in AM. Will admit to hospitalist for syncope, chest pain r/o ACS.        Drenda Freeze, MD 04/10/16 Einar Crow

## 2016-04-11 ENCOUNTER — Observation Stay (HOSPITAL_BASED_OUTPATIENT_CLINIC_OR_DEPARTMENT_OTHER): Payer: Medicare Other

## 2016-04-11 ENCOUNTER — Observation Stay (HOSPITAL_COMMUNITY): Payer: Medicare Other

## 2016-04-11 ENCOUNTER — Encounter (HOSPITAL_COMMUNITY): Payer: Self-pay | Admitting: General Practice

## 2016-04-11 DIAGNOSIS — E669 Obesity, unspecified: Secondary | ICD-10-CM | POA: Diagnosis present

## 2016-04-11 DIAGNOSIS — E785 Hyperlipidemia, unspecified: Secondary | ICD-10-CM | POA: Diagnosis not present

## 2016-04-11 DIAGNOSIS — R072 Precordial pain: Secondary | ICD-10-CM | POA: Diagnosis not present

## 2016-04-11 DIAGNOSIS — Z992 Dependence on renal dialysis: Secondary | ICD-10-CM

## 2016-04-11 DIAGNOSIS — R55 Syncope and collapse: Secondary | ICD-10-CM | POA: Diagnosis not present

## 2016-04-11 DIAGNOSIS — E1122 Type 2 diabetes mellitus with diabetic chronic kidney disease: Secondary | ICD-10-CM | POA: Diagnosis not present

## 2016-04-11 DIAGNOSIS — D638 Anemia in other chronic diseases classified elsewhere: Secondary | ICD-10-CM

## 2016-04-11 DIAGNOSIS — I671 Cerebral aneurysm, nonruptured: Secondary | ICD-10-CM | POA: Diagnosis present

## 2016-04-11 DIAGNOSIS — I10 Essential (primary) hypertension: Secondary | ICD-10-CM | POA: Diagnosis not present

## 2016-04-11 DIAGNOSIS — N186 End stage renal disease: Secondary | ICD-10-CM

## 2016-04-11 DIAGNOSIS — Z8673 Personal history of transient ischemic attack (TIA), and cerebral infarction without residual deficits: Secondary | ICD-10-CM

## 2016-04-11 DIAGNOSIS — R079 Chest pain, unspecified: Secondary | ICD-10-CM | POA: Diagnosis not present

## 2016-04-11 DIAGNOSIS — I7102 Dissection of abdominal aorta: Secondary | ICD-10-CM | POA: Diagnosis not present

## 2016-04-11 LAB — ECHOCARDIOGRAM COMPLETE
AO mean calculated velocity dopler: 147 cm/s
AOPV: 0.4 m/s
AOVTI: 45.1 cm
AV Area VTI index: 1.03 cm2/m2
AV Area VTI: 1.68 cm2
AV Mean grad: 10 mmHg
AV Peak grad: 19 mmHg
AV VEL mean LVOT/AV: 0.42
AV area mean vel ind: 0.87 cm2/m2
AVAREAMEANV: 1.74 cm2
AVPKVEL: 216 cm/s
CHL CUP AV PEAK INDEX: 0.84
CHL CUP AV VALUE AREA INDEX: 1.03
CHL CUP AV VEL: 2.04
CHL CUP DOP CALC LVOT VTI: 22.2 cm
CHL CUP MV DEC (S): 232
EERAT: 19.37
EWDT: 232 ms
FS: 35 % (ref 28–44)
Height: 65 in
IV/PV OW: 0.93
LA diam end sys: 50 mm
LADIAMINDEX: 2.51 cm/m2
LASIZE: 50 mm
LAVOL: 114 mL
LAVOLA4C: 100 mL
LAVOLIN: 57.3 mL/m2
LV E/e' medial: 19.37
LV E/e'average: 19.37
LV PW d: 13.5 mm — AB (ref 0.6–1.1)
LVELAT: 5.68 cm/s
LVOT area: 4.15 cm2
LVOT diameter: 23 mm
LVOT peak VTI: 0.49 cm
LVOT peak vel: 87.2 cm/s
LVOTSV: 92 mL
Lateral S' vel: 15.9 cm/s
MV Peak grad: 5 mmHg
MV pk A vel: 64 m/s
MV pk E vel: 110 m/s
TAPSE: 19.9 mm
TDI e' lateral: 5.68
TDI e' medial: 5.85
Valve area: 2.04 cm2
Weight: 3227.53 oz

## 2016-04-11 LAB — TROPONIN I
TROPONIN I: 0.04 ng/mL — AB (ref <0.03)
TROPONIN I: 0.03 ng/mL — AB (ref <0.03)
TROPONIN I: 0.03 ng/mL — AB (ref <0.03)
Troponin I: 0.03 ng/mL (ref <0.03)

## 2016-04-11 MED ORDER — DOXYCYCLINE HYCLATE 100 MG PO TABS
100.0000 mg | ORAL_TABLET | Freq: Two times a day (BID) | ORAL | Status: DC
  Administered 2016-04-11 – 2016-04-12 (×3): 100 mg via ORAL
  Filled 2016-04-11 (×3): qty 1

## 2016-04-11 NOTE — Progress Notes (Signed)
  Echocardiogram 2D Echocardiogram has been performed.  Sean Best 04/11/2016, 12:12 PM

## 2016-04-11 NOTE — Procedures (Signed)
EEG Report Date  04/11/16 Referring physician  Grayland Jack  Reason for the study- syncope  Medication- acetaminophen (TYLENOL) tablet 650 mg   allopurinol (ZYLOPRIM) tablet 100 mg   aspirin EC tablet 325 mg   atorvastatin (LIPITOR) tablet 40 mg   calcitRIOL (ROCALTROL) capsule 0.25 mcg   cinacalcet (SENSIPAR) tablet 30 mg   doxycycline (VIBRA-TABS) tablet 100 mg  Technical-This is a multichannel digital EEG recording using the international 10-20 placement system.  Description of the recording Posterior dominant rhythm is 8-9 Hz symmetrical Photic stimulation did not produce any abnormal response  Sleep was not obtained Epileptiform features were not seen during this recording  Impression The EEG is normal with patient recorded in awake and sleep state

## 2016-04-11 NOTE — Progress Notes (Signed)
Progress Note    Sean Best  GDJ:[id number] DOB: 1933/07/10  DOA: 04/10/2016 PCP: [name], MD    Brief Narrative:   Chief complaint: Follow-up chest pain  Sean Best is an 83 y.o. male with a PMH of hypertension, hyperlipidemia, previous CVA, PVD, ESRD on hemodialysis who was admitted 04/10/16 for evaluation of chest pain. In emergency room, twelve-lead EKG was negative for any acute ST-T wave changes, chest x-ray indicated stable cardiomegaly. He had CT angiogram of abdomen and pelvis which indicated saccular aneurysm of infrarenal aorta with thrombosed dissection of the abdominal aorta, with incidental bilateral renal cystic changes.  Assessment/Plan:   Principal Problem:   Chest pain Troponin 0.032. 12-lead EKG personally reviewed and showed sinus rhythm with premature atrial complexes with a ventricular rate of 72 bpm and no ischemic changes appreciated. Chest x-ray personally reviewed. Cardiomegaly with no other acute abnormalities appreciated. 2-D echocardiogram pending. Continue aspirin. Continue statin. Continue beta blocker. Morphine as needed for pain.   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 Donzetta Matters of vascular surgery was consulted, and Plans to have him follow-up as an outpatient in 4 weeks with repeat CT angiogram of the chest, abdomen and pelvis. No further inpatient evaluation recommended.    Syncope Seizure versus syncopal event noted at HD by nursing staff 04/10/16. EEG ordered for further evaluation.    Anemia of chronic disease Hemoglobin stable.    DM (diabetes mellitus), type 2 with renal complications, diet controlled Not on medications. Last hemoglobin A1c was 5.3 in 2015. Will repeat.    Benign hypertension Blood pressure a little on the high side. Continue metoprolol.    Hyperlipidemia Continue Lipitor.    ESRD on hemodialysis [facility]) Dialysis days Mondays, Wednesdays and  Fridays. We'll notify nephrology if the patient remains in the hospital. Continue calcitriol, Renvela and Sensipar.    H/O: stroke Continue risk factor modification.   Obesity (BMI 30-39.9) Body mass index is 33.57 kg/m.     Boil Spontaneously draining. We'll place on doxycycline.   Family Communication/Anticipated D/C date and plan/Code Status   DVT prophylaxis: SCDs ordered. Code Status: Full Code.  Family Communication: No family at bedside. Disposition Plan: Home within 24 hours if diagnostic testing unrevealing.   Medical Consultants:    CVTS.   Procedures:    None  Anti-Infectives:    Doxycycline 04/11/16--->  Subjective:   Patient denies any current chest pain, pain in his neck or left arm. Reports that he has a draining boil on his right buttock. No nausea, vomiting, or diarrhea.  Objective:    Vitals:   04/10/16 1845 04/10/16 2043 04/10/16 2230 04/11/16 0531  BP: 158/89 (!) 157/90 (!) 151/82 (!) 168/83  Pulse: 72 71 72 72  Resp: 18 18    Temp:  98.5 F (36.9 C)  98 F (36.7 C)  TempSrc:  Oral  Oral  SpO2: 100% 100%  100%  Weight:  91.5 kg (201 lb 11.5 oz)    Height:  5\' 5"  (1.651 m)     No intake or output data in the 24 hours ending 04/11/16 0825 Filed Weights   04/10/16 2043  Weight: 91.5 kg (201 lb 11.5 oz)    Exam: General exam: Appears calm and comfortable.  Respiratory system: Clear to auscultation. Respiratory effort normal. Cardiovascular system: S1 & S2 heard, RRR. No JVD,  rubs, gallops or clicks. II?VI murmur. Gastrointestinal system: Abdomen is nondistended, soft and  nontender. No organomegaly or masses felt. Normal bowel sounds heard. Central nervous system: Alert and oriented. No focal neurological deficits. Extremities: No clubbing,  or cyanosis. No edema. Left lower arm AV graft with good thrill and bruit. Skin: Draining boil right buttock as pictured below. Psychiatry: Judgement and insight appear normal. Mood & affect  appropriate.       Data Reviewed:   I have personally reviewed following labs and imaging studies:  Labs: Basic Metabolic Panel:  Recent Labs Lab 04/10/16 1435  NA 139  K 4.1  CL 95*  CO2 30  GLUCOSE 145*  BUN 23*  CREATININE 7.21*  CALCIUM 8.6*   GFR Estimated Creatinine Clearance: 8.5 mL/min (by C-G formula based on SCr of 7.21 mg/dL (H)). Liver Function Tests:  Recent Labs Lab 04/10/16 1435  AST 16  ALT 12*  ALKPHOS 51  BILITOT 0.4  PROT 6.4*  ALBUMIN 3.0*   No results for input(s): LIPASE, AMYLASE in the last 168 hours. No results for input(s): AMMONIA in the last 168 hours. Coagulation profile No results for input(s): INR, PROTIME in the last 168 hours.  CBC:  Recent Labs Lab 04/10/16 1435  WBC 10.6*  NEUTROABS 7.0  HGB 9.5*  HCT 30.4*  MCV 93.8  PLT 287   Cardiac Enzymes:  Recent Labs Lab 04/10/16 1435 04/11/16 0049 04/11/16 0409  TROPONINI CRITICAL RESULT CALLED TO, READ BACK BY AND VERIFIED WITH: 0.03* 0.03*   BNP (last 3 results) No results for input(s): PROBNP in the last 8760 hours. CBG:  Recent Labs Lab 04/10/16 2223  GLUCAP 81   D-Dimer: No results for input(s): DDIMER in the last 72 hours. Hgb A1c: No results for input(s): HGBA1C in the last 72 hours. Lipid Profile: No results for input(s): CHOL, HDL, LDLCALC, TRIG, CHOLHDL, LDLDIRECT in the last 72 hours. Thyroid function studies: No results for input(s): TSH, T4TOTAL, T3FREE, THYROIDAB in the last 72 hours.  Invalid input(s): FREET3 Anemia work up: No results for input(s): VITAMINB12, FOLATE, FERRITIN, TIBC, IRON, RETICCTPCT in the last 72 hours. Sepsis Labs:  Recent Labs Lab 04/10/16 1435  WBC 10.6*    Microbiology Recent Results (from the past 240 hour(s))  MRSA PCR Screening     Status: None   Collection Time: 04/10/16  8:57 PM  Result Value Ref Range Status   MRSA by PCR NEGATIVE NEGATIVE Final    Comment:        The GeneXpert MRSA Assay  (FDA approved for NASAL specimens only), is one component of a comprehensive MRSA colonization surveillance program. It is not intended to diagnose MRSA infection nor to guide or monitor treatment for MRSA infections.     Radiology: Dg Chest Portable 1 View  Result Date: 04/10/2016 CLINICAL DATA:  83 year old male with chest pain, diaphoresis, shortness of Breath, syncope. Hypotensive (88 systolic). Initial encounter. EXAM: PORTABLE CHEST 1 VIEW COMPARISON:  03/25/2016 and earlier. FINDINGS: Portable AP upright view at 1448 hours Stable cardiomegaly and mediastinal contours. Allowing for portable technique the lungs are clear. No pneumothorax or pleural effusion. IMPRESSION: Stable cardiomegaly. No acute cardiopulmonary abnormality. Electronically Signed   By: Genevie Ann M.D.   On: 04/10/2016 14:58   Ct Angio Chest/abd/pel For Dissection W And/or W/wo  Result Date: 04/10/2016 CLINICAL DATA:  Left-sided chest pain during dialysis with shortness of Breath EXAM: CT ANGIOGRAPHY CHEST, ABDOMEN AND PELVIS TECHNIQUE: Multidetector CT imaging through the chest, abdomen and pelvis was performed using the standard protocol during bolus administration of intravenous contrast. Multiplanar  reconstructed images and MIPs were obtained and reviewed to evaluate the vascular anatomy. CONTRAST:  100 mL Isovue 370. COMPARISON:  Chest x-ray from earlier in the same day FINDINGS: CTA CHEST FINDINGS Cardiovascular: Heavy coronary calcifications are noted. Aortic atherosclerotic changes are noted without aneurysmal dilatation or dissection. The cardiac structures are not significantly enlarged. The pulmonary artery shows no evidence of pulmonary emboli. Mediastinum/Nodes: No significant hilar or mediastinal adenopathy is noted. The thoracic inlet is within normal limits. No axillary adenopathy is seen. Lungs/Pleura: Lungs are clear. No pleural effusion or pneumothorax. Musculoskeletal: Degenerative changes of the thoracic  spine are noted. At T6-7 there is significant endplate irregularity and mild perispinal inflammatory changes. Review of the MIP images confirms the above findings. CTA ABDOMEN AND PELVIS FINDINGS VASCULAR Aorta: Focal saccular aneurysmal dilatation is noted to 3.8 cm. Changes of prior dissection with thrombosis are seen in the abdominal aorta just below the origin of the SMA. No other findings to suggest acute dissection are seen. Celiac: Patent without evidence of aneurysm, dissection, vasculitis or significant stenosis. SMA: Patent without evidence of aneurysm, dissection, vasculitis or significant stenosis. Renals: Single renal arteries are identified bilaterally with atherosclerotic change. No focal high-grade stenosis is noted. IMA: Widely patent Iliacs: Diffuse atherosclerotic calcifications are noted without aneurysmal dilatation. Veins: No acute abnormality of the venous structures is noted. Review of the MIP images confirms the above findings. NON-VASCULAR Hepatobiliary: No focal liver abnormality is seen. No gallstones, gallbladder wall thickening, or biliary dilatation. Pancreas: Unremarkable. No pancreatic ductal dilatation or surrounding inflammatory changes. Spleen: Normal in size without focal abnormality. Adrenals/Urinary Tract: The adrenal glands are within normal limits. Bilateral renal cystic changes are seen. Some scattered wall calcifications are noted within the renal cysts. The largest of these lesions is on the left measuring 3.5 cm. Stomach/Bowel: No obstructive changes or inflammatory changes are seen. The appendix is within normal limits. Lymphatic: No specific lymphadenopathy is noted. Reproductive: Multiple prostate brachyTherapy seeds are noted. Other: No abdominal wall hernia or abnormality. No abdominopelvic ascites. Musculoskeletal: Degenerative changes of the lumbar spine are noted. Review of the MIP images confirms the above findings. IMPRESSION: Significant degenerative changes in  the thoracic and lumbar spine. Saccular aneurysm of the infrarenal aorta as described. Thrombosed dissection of the abdominal aorta and just below the level of the superior mesenteric artery. No acute dissection is noted. Bilateral renal cystic changes likely of a chronic nature. The largest of these measures 3.5 cm and demonstrates some calcification within. Need for further evaluation can be determined on a clinical basis. This would be best achieved with MRI. Chronic changes as described above. Electronically Signed   By: Inez Catalina M.D.   On: 04/10/2016 17:47    Medications:   . allopurinol  100 mg Oral Daily  . aspirin EC  325 mg Oral Daily  . atorvastatin  40 mg Oral Daily  . [START ON 04/13/2016] calcitRIOL  0.25 mcg Oral Once per day on Mon Wed Fri  . cinacalcet  30 mg Oral Q breakfast  . metoprolol  100 mg Oral BID  . multivitamin  1 tablet Oral Daily  . sevelamer carbonate  2,400 mg Oral TID WC   Continuous Infusions:  Medical decision making is of high complexity and this patient is at high risk of deterioration, therefore this is a level 3 visit.  (> 4 problem points, >4 data points, moderate risk)   LOS: 0 days   Srihith Aquilino  [facility] Pager [phone]. If unable to reach  me by pager, please call my cell phone at [phone].  *Please refer to [email or web address], password [email or web address] to get updated schedule on who will round on this patient, as hospitalists switch teams weekly. If 7PM-7AM, please contact night-coverage at [email or web address], password [email or web address] for any overnight needs.  04/11/2016, 8:25 AM

## 2016-04-11 NOTE — Progress Notes (Signed)
Call received from lab.  Troponin 0.03.  Paged attending.  Await further orders.  Pt resting, no s/s of distress.

## 2016-04-11 NOTE — Progress Notes (Signed)
EEG completed, results pending. 

## 2016-04-11 NOTE — Progress Notes (Signed)
Assumed care for Pt.  Pt denies chest pain at this time.  Discussed Pt POC with Pt being NPO for possible tests in AM.  Pt indicates understanding.  Pt no s/s of distress, will cont to monitor.

## 2016-04-11 NOTE — Consult Note (Signed)
Hospital Consult    Reason for Consult:  ?dissection with saccular aneurysm of abdominal aorta MRN #:  [id number]  History of Present Illness: This is a 83 y.o. male with h/o esrd on mwf dialysis. Yesterday had episode of chest pain radiating to left arm and back with possible loc. CT angio in ED demonstrated saccular infrarenal aneurysm at 3.8cm with thrombosed dissection of aortic segment near sma. He denies abdominal pain or back pain at this time and feeling well. Never had similar episode. Denies fevers, chills nausea or emesis associated.   Past Medical History:  Diagnosis Date  . Anemia   . Arthritis    "knees" (04/11/2016)  . Blind left eye    S/P trauma  . Chronic total occlusion of artery of the extremities (HCC)    pt not aware of this  . Claudication ([place])   . ESRD on dialysis [facility])    "MWF; [name]" ((04/10/2016)  . ESRD on peritoneal dialysis [facility])    Started dialysis around April 2015 per son.  Has been doing peritoneal dialysis at home.     . Gout   . Hyperlipidemia   . Hypertension   . Overweight(278.02)   . Prostate cancer ([place]) 1990s  . Shingles   . Stroke [facility]) ~1998   denies residual on 04/11/2016  . Type II diabetes mellitus ([place])     Past Surgical History:  Procedure Laterality Date  . AV FISTULA PLACEMENT, RADIOCEPHALIC  77/82/4235   Left arm  . CAPD INSERTION N/A 03/06/2013   Procedure: LAPAROSCOPIC INSERTION CONTINUOUS AMBULATORY PERITONEAL DIALYSIS  (CAPD) CATHETER;  Surgeon: Edward Jolly, MD;  Location: [place];  Service: General;  Laterality: N/A;  . CATARACT EXTRACTION Right   . EYE SURGERY Left 1970s   for eye injury  . HERNIA REPAIR    . INSERTION PROSTATE RADIATION SEED    . UMBILICAL HERNIA REPAIR N/A 03/06/2013   Procedure: HERNIA REPAIR UMBILICAL WITH MESH;  Surgeon: Edward Jolly, MD;  Location: [facility] OR;  Service: General;  Laterality: N/A;    Allergies  Allergen Reactions  . Cardura [Doxazosin Mesylate] Other (See  Comments)    Hallucinations    Prior to Admission medications   Medication Sig Start Date End Date Taking? Authorizing Provider  ACCU-CHEK COMPACT STRIPS test strip  05/20/10  Yes Historical Provider, MD  acetaminophen (TYLENOL) 500 MG tablet Take 1,000 mg by mouth daily as needed for headache.   Yes Historical Provider, MD  allopurinol (ZYLOPRIM) 100 MG tablet Take 100 mg by mouth daily.     Yes Historical Provider, MD  aspirin EC 325 MG tablet Take 325 mg by mouth daily.   Yes Historical Provider, MD  atorvastatin (LIPITOR) 40 MG tablet Take 40 mg by mouth daily.     Yes Historical Provider, MD  calcitRIOL (ROCALTROL) 0.25 MCG capsule Take 0.25 mcg by mouth. Take 0.25 mcg on Mon, Wed, and Friday 03/13/14  Yes Historical Provider, MD  metoprolol (LOPRESSOR) 100 MG tablet Take 100 mg by mouth 2 (two) times daily.   Yes Historical Provider, MD  multivitamin (RENA-VIT) TABS tablet Take 1 tablet by mouth daily.   Yes Historical Provider, MD  SENSIPAR 30 MG tablet Take 30 mg by mouth daily. 12/12/14  Yes Historical Provider, MD  sevelamer carbonate (RENVELA) 800 MG tablet Take 1,600-2,400 mg by mouth See admin instructions. Take 3 tablets (2400 mg) by mouth 3 times daily with meals and 2 tablets (1600 mg) twice daily with snacks  Yes Historical Provider, MD  diphenoxylate-atropine (LOMOTIL) 2.5-0.025 MG per tablet TAKE 1 TABLET BY MOUTH EVERY 4 TO 6 HOURS AS NEEDED Patient not taking: Reported on 04/10/2016 10/09/14   Loralie Champagne, PA-C    Social History   Social History  . Marital status: Widowed    Spouse name: N/A  . Number of children: N/A  . Years of education: N/A   Occupational History  . Not on file.   Social History Main Topics  . Smoking status: Never Smoker  . Smokeless tobacco: Never Used  . Alcohol use No  . Drug use: No  . Sexual activity: No   Other Topics Concern  . Not on file   Social History Narrative  . No narrative on file     Family History  Problem  Relation Age of Onset  . Cancer Mother   . Heart disease Mother   . Cancer Father     ROS: [x]  Positive   [ ]  Negative   [ ]  All sytems reviewed and are negative  Cardiovascular: [x]  chest pain/pressure []  palpitations []  SOB lying flat []  DOE []  pain in legs while walking []  pain in legs at rest []  pain in legs at night []  non-healing ulcers []  hx of DVT []  swelling in legs  Pulmonary: []  productive cough []  asthma/wheezing []  home O2  Neurologic: []  weakness in []  arms []  legs []  numbness in []  arms []  legs []  hx of CVA []  mini stroke [] difficulty speaking or slurred speech []  temporary loss of vision in one eye []  dizziness  Hematologic: []  hx of cancer []  bleeding problems []  problems with blood clotting easily  Endocrine:   []  diabetes []  thyroid disease  GI []  vomiting blood []  blood in stool  GU: [x]  CKD/renal failure [x]  HD--[x]  M/W/F or []  T/T/S []  burning with urination []  blood in urine  Psychiatric: []  anxiety []  depression  Musculoskeletal: []  arthritis []  joint pain  Integumentary: []  rashes []  ulcers  Constitutional: []  fever []  chills   Physical Examination  Vitals:   04/10/16 2230 04/11/16 0531  BP: (!) 151/82 (!) 168/83  Pulse: 72 72  Resp:    Temp:  98 F (36.7 C)   Body mass index is 33.57 kg/m.  General:  WDWN in NAD Gait: Not observed HENT: WNL, normocephalic Pulmonary: normal non-labored breathing Cardiac: easily palpable femoral and popliteal pulses Abdomen: soft, NT/ND, no masses Extremities: without ischemic changes, without Gangrene , without cellulitis; without open wounds;  Musculoskeletal: no muscle wasting or atrophy  Neurologic: A&O X 3; Appropriate Affect ; SENSATION: normal; MOTOR FUNCTION:  moving all extremities equally. Speech is fluent/normal   CBC    Component Value Date/Time   WBC 10.6 (H) 04/10/2016 1435   RBC 3.24 (L) 04/10/2016 1435   HGB 9.5 (L) 04/10/2016 1435   HCT 30.4 (L)  04/10/2016 1435   PLT 287 04/10/2016 1435   MCV 93.8 04/10/2016 1435   MCH 29.3 04/10/2016 1435   MCHC 31.3 04/10/2016 1435   RDW 17.0 (H) 04/10/2016 1435   LYMPHSABS 2.5 04/10/2016 1435   MONOABS 0.8 04/10/2016 1435   EOSABS 0.2 04/10/2016 1435   BASOSABS 0.1 04/10/2016 1435    BMET    Component Value Date/Time   NA 139 04/10/2016 1435   K 4.1 04/10/2016 1435   CL 95 (L) 04/10/2016 1435   CO2 30 04/10/2016 1435   GLUCOSE 145 (H) 04/10/2016 1435   BUN 23 (H) 04/10/2016 1435  CREATININE 7.21 (H) 04/10/2016 1435   CALCIUM 8.6 (L) 04/10/2016 1435   GFRNONAA 6 (L) 04/10/2016 1435   GFRAA 7 (L) 04/10/2016 1435    COAGS: Lab Results  Component Value Date   INR 1.39 12/29/2014   INR 1.25 12/28/2014   INR 1.31 03/11/2013     Non-Invasive Vascular Imaging:   IMPRESSION: Significant degenerative changes in the thoracic and lumbar spine.  Saccular aneurysm of the infrarenal aorta as described.  Thrombosed dissection of the abdominal aorta and just below the level of the superior mesenteric artery. No acute dissection is noted.  Bilateral renal cystic changes likely of a chronic nature. The largest of these measures 3.5 cm and demonstrates some calcification within. Need for further evaluation can be determined on a clinical basis. This would be best achieved with MRI.   ASSESSMENT/PLAN: This is a 83 y.o. male here with chest pain found to have above findings on dissection protocol Ct. Appears chronic. Will have f/u in office in 4 weeks with repeat CT angio of chest abd and pelvis. Patient is agreeable. Please call with any questions or changes in his clinical course.   Aniyia Rane C. Donzetta Matters, MD [facility] Office: [phone] Pager: [phone]

## 2016-04-12 DIAGNOSIS — R079 Chest pain, unspecified: Secondary | ICD-10-CM | POA: Diagnosis not present

## 2016-04-12 DIAGNOSIS — N186 End stage renal disease: Secondary | ICD-10-CM

## 2016-04-12 DIAGNOSIS — R55 Syncope and collapse: Secondary | ICD-10-CM | POA: Diagnosis not present

## 2016-04-12 DIAGNOSIS — D638 Anemia in other chronic diseases classified elsewhere: Secondary | ICD-10-CM | POA: Diagnosis not present

## 2016-04-12 DIAGNOSIS — E669 Obesity, unspecified: Secondary | ICD-10-CM

## 2016-04-12 DIAGNOSIS — I671 Cerebral aneurysm, nonruptured: Secondary | ICD-10-CM

## 2016-04-12 DIAGNOSIS — Z992 Dependence on renal dialysis: Secondary | ICD-10-CM

## 2016-04-12 DIAGNOSIS — I10 Essential (primary) hypertension: Secondary | ICD-10-CM | POA: Diagnosis not present

## 2016-04-12 DIAGNOSIS — E785 Hyperlipidemia, unspecified: Secondary | ICD-10-CM

## 2016-04-12 DIAGNOSIS — Z8673 Personal history of transient ischemic attack (TIA), and cerebral infarction without residual deficits: Secondary | ICD-10-CM

## 2016-04-12 LAB — HEMOGLOBIN A1C
Hgb A1c MFr Bld: 5.5 % (ref 4.8–5.6)
MEAN PLASMA GLUCOSE: 111 mg/dL

## 2016-04-12 LAB — RENAL FUNCTION PANEL
ALBUMIN: 2.7 g/dL — AB (ref 3.5–5.0)
Anion gap: 15 (ref 5–15)
BUN: 40 mg/dL — ABNORMAL HIGH (ref 6–20)
CALCIUM: 8.8 mg/dL — AB (ref 8.9–10.3)
CO2: 29 mmol/L (ref 22–32)
CREATININE: 10.35 mg/dL — AB (ref 0.61–1.24)
Chloride: 93 mmol/L — ABNORMAL LOW (ref 101–111)
GFR, EST AFRICAN AMERICAN: 5 mL/min — AB (ref >60)
GFR, EST NON AFRICAN AMERICAN: 4 mL/min — AB (ref >60)
Glucose, Bld: 89 mg/dL (ref 65–99)
Phosphorus: 6 mg/dL — ABNORMAL HIGH (ref 2.5–4.6)
Potassium: 5 mmol/L (ref 3.5–5.1)
SODIUM: 137 mmol/L (ref 135–145)

## 2016-04-12 MED ORDER — DOXYCYCLINE HYCLATE 100 MG PO TABS: 100.0000 mg | ORAL_TABLET | Freq: Two times a day (BID) | ORAL | 0 refills | Status: DC

## 2016-04-12 NOTE — Discharge Summary (Addendum)
Physician Discharge Summary  Sean Best VZC:[id number] DOB: 08/29/1933 DOA: 04/10/2016  PCP: [name], MD  Admit date: 04/10/2016 Discharge date: 04/12/2016  Admitted From: Home Discharge disposition: Home   Recommendations for Outpatient Follow-Up:   1. The patient will follow-up at HD on 04/13/16. 2. He will also follow-up with vascular surgery in 4 weeks for repeat imaging.   Discharge Diagnosis:   Principal Problem:   Chest pain and syncope, likely related to hypotension during HD. 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  Diet recommendation: Low sodium, heart healthy.  Carbohydrate-modified.  Renal.    History of Present Illness:   Sean Best is an 83 y.o. male with a PMH of hypertension, hyperlipidemia, previous CVA, PVD, ESRD on hemodialysis who was admitted 04/10/16 for evaluation of chest pain. In emergency room, twelve-lead EKG was negative for any acute ST-T wave changes, chest x-ray indicated stable cardiomegaly. He had CT angiogram of abdomen and pelvis which indicatedsaccular aneurysmof infrarenal aorta with thrombosed dissection of the abdominal aorta, with incidental bilateral renal cystic changes.   Hospital Course by Problem:   Principal Problem:   Chest pain Troponin 0.032. 12-lead EKG personally reviewed and showed sinus rhythm with premature atrial complexes with a ventricular rate of 72 bpm and no ischemic changes appreciated. Chest x-ray personally reviewed. Cardiomegaly with no other acute abnormalities appreciated. 2-D echocardiogram Showed an EF of 60-65 percent, grade 2 diastolic dysfunction  with no regional wall motion abnormalities. Continue aspirin. Continue statin. Continue beta blocker. No chest pain or syncopal episodes while in the hospital.    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 Zenia Resides vascular surgery was consulted, and Plans to have him follow-up as an outpatient in 4 weeks with repeat CT angiogram of the chest, abdomen and pelvis. No further inpatient evaluation recommended.    Syncope Seizure versus syncopal event noted at HD by nursing staff 04/10/16. EEG normal, suspect he had a hypotensive episode related to his hemodialysis.    Anemia of chronic disease Hemoglobin stable.    DM (diabetes mellitus), type 2 with renal complications, diet controlled Not on medications. Last hemoglobin A1c was 5.3 in 2015, repeated but pending at this time.    Benign hypertension Blood pressure a little on the high side. Continue metoprolol.    Hyperlipidemia Continue Lipitor.    ESRD on hemodialysis [facility]) Dialysis days Mondays, Wednesdays and Fridays.Continue calcitriol, Renvela and Sensipar.    H/O: stroke Continue risk factor modification.   Obesity (BMI 30-39.9) Body mass index is 33.57 kg/m.     Boil Spontaneously draining. We'll place on doxycycline.    Medical Consultants:    None.   Discharge Exam:   Vitals:   04/12/16 0606 04/12/16 0902  BP: (!) 170/89 (!) 162/87  Pulse: 72 75  Resp: 16   Temp: 98.5 F (36.9 C)    Vitals:   04/11/16 1508 04/11/16 2028 04/12/16 0606 04/12/16 0902  BP: (!) 169/89 (!) 177/88 (!) 170/89 (!) 162/87  Pulse: 68 78 72 75  Resp: 19  18 16   Temp: 98.3 F (36.8 C) 98.7 F (37.1 C) 98.5 F (36.9 C)   TempSrc: Oral Oral Oral   SpO2: 100% 99% 98%   Weight:      Height:       General exam: Appears calm and comfortable.  Respiratory system: Clear to auscultation. Respiratory effort normal. Cardiovascular system: S1 & S2 heard, RRR.  No JVD,  rubs, gallops or clicks. II/VI murmur. Gastrointestinal system: Abdomen is nondistended, soft and nontender. No organomegaly or masses felt. Normal bowel sounds heard. Central nervous system: Alert and oriented. No focal neurological deficits. Extremities: No clubbing,  or cyanosis. No edema. Left lower arm AV graft with good thrill and bruit. Skin: Draining boil right buttock as pictured below. Psychiatry: Judgement and insight appear normal. Mood & affect appropriate.       The results of significant diagnostics from this hospitalization (including imaging, microbiology, ancillary and laboratory) are listed below for reference.     Procedures and Diagnostic Studies:   Dg Chest Portable 1 View  Result Date: 04/10/2016 CLINICAL DATA:  83 year old male with chest pain, diaphoresis, shortness of Breath, syncope. Hypotensive (88 systolic). Initial encounter. EXAM: PORTABLE CHEST 1 VIEW COMPARISON:  03/25/2016 and earlier. FINDINGS: Portable AP upright view at 1448 hours Stable cardiomegaly and mediastinal contours. Allowing for portable technique the lungs are clear. No pneumothorax or pleural effusion. IMPRESSION: Stable cardiomegaly. No acute cardiopulmonary abnormality. Electronically Signed   By: Genevie Ann M.D.   On: 04/10/2016 14:58   Ct Angio Chest/abd/pel For Dissection W And/or W/wo  Result Date: 04/10/2016 CLINICAL DATA:  Left-sided chest pain during dialysis with shortness of Breath EXAM: CT ANGIOGRAPHY CHEST, ABDOMEN AND PELVIS TECHNIQUE: Multidetector CT imaging through the chest, abdomen and pelvis was performed using the standard protocol during bolus administration of intravenous contrast. Multiplanar reconstructed images and MIPs were obtained and reviewed to evaluate the vascular anatomy. CONTRAST:  100 mL Isovue 370. COMPARISON:  Chest x-ray from earlier in the same day FINDINGS: CTA CHEST FINDINGS Cardiovascular: Heavy coronary calcifications are noted. Aortic  atherosclerotic changes are noted without aneurysmal dilatation or dissection. The cardiac structures are not significantly enlarged. The pulmonary artery shows no evidence of pulmonary emboli. Mediastinum/Nodes: No significant hilar or mediastinal adenopathy is noted. The thoracic inlet is within normal limits. No axillary adenopathy is seen. Lungs/Pleura: Lungs are clear. No pleural effusion or pneumothorax. Musculoskeletal: Degenerative changes of the thoracic spine are noted. At T6-7 there is significant endplate irregularity and mild perispinal inflammatory changes. Review of the MIP images confirms the above findings. CTA ABDOMEN AND PELVIS FINDINGS VASCULAR Aorta: Focal saccular aneurysmal dilatation is noted to 3.8 cm. Changes of prior dissection with thrombosis are seen in the abdominal aorta just below the origin of the SMA. No other findings to suggest acute dissection are seen. Celiac: Patent without evidence of aneurysm, dissection, vasculitis or significant stenosis. SMA: Patent without evidence of aneurysm, dissection, vasculitis or significant stenosis. Renals: Single renal arteries are identified bilaterally with atherosclerotic change. No focal high-grade stenosis is noted. IMA: Widely patent Iliacs: Diffuse atherosclerotic calcifications are noted without aneurysmal dilatation. Veins: No acute abnormality of the venous structures is noted. Review of the MIP images confirms the above findings. NON-VASCULAR Hepatobiliary: No focal liver abnormality is seen. No gallstones, gallbladder wall thickening, or biliary dilatation. Pancreas: Unremarkable. No pancreatic ductal dilatation or surrounding inflammatory changes. Spleen: Normal in size without focal abnormality. Adrenals/Urinary Tract: The adrenal glands are within normal limits. Bilateral renal cystic changes are  seen. Some scattered wall calcifications are noted within the renal cysts. The largest of these lesions is on the left measuring 3.5 cm.  Stomach/Bowel: No obstructive changes or inflammatory changes are seen. The appendix is within normal limits. Lymphatic: No specific lymphadenopathy is noted. Reproductive: Multiple prostate brachyTherapy seeds are noted. Other: No abdominal wall hernia or abnormality. No abdominopelvic ascites. Musculoskeletal: Degenerative changes of the lumbar spine are noted. Review of the MIP images confirms the above findings. IMPRESSION: Significant degenerative changes in the thoracic and lumbar spine. Saccular aneurysm of the infrarenal aorta as described. Thrombosed dissection of the abdominal aorta and just below the level of the superior mesenteric artery. No acute dissection is noted. Bilateral renal cystic changes likely of a chronic nature. The largest of these measures 3.5 cm and demonstrates some calcification within. Need for further evaluation can be determined on a clinical basis. This would be best achieved with MRI. Chronic changes as described above. Electronically Signed   By: Inez Catalina M.D.   On: 04/10/2016 17:47     Labs:   Basic Metabolic Panel:  Recent Labs Lab 04/10/16 1435 04/12/16 0320  NA 139 137  K 4.1 5.0  CL 95* 93*  CO2 30 29  GLUCOSE 145* 89  BUN 23* 40*  CREATININE 7.21* 10.35*  CALCIUM 8.6* 8.8*  PHOS  --  6.0*   GFR Estimated Creatinine Clearance: 5.9 mL/min (by C-G formula based on SCr of 10.35 mg/dL (H)). Liver Function Tests:  Recent Labs Lab 04/10/16 1435 04/12/16 0320  AST 16  --   ALT 12*  --   ALKPHOS 51  --   BILITOT 0.4  --   PROT 6.4*  --   ALBUMIN 3.0* 2.7*    CBC:  Recent Labs Lab 04/10/16 1435  WBC 10.6*  NEUTROABS 7.0  HGB 9.5*  HCT 30.4*  MCV 93.8  PLT 287   Cardiac Enzymes:  Recent Labs Lab 04/10/16 1435 04/11/16 0049 04/11/16 0409 04/11/16 1151  TROPONINI 0.03* 0.03* 0.03* 0.04*   BNP: Invalid input(s): POCBNP CBG:  Recent Labs Lab 04/10/16 2223  GLUCAP 53   Microbiology Recent Results (from the past 240  hour(s))  MRSA PCR Screening     Status: None   Collection Time: 04/10/16  8:57 PM  Result Value Ref Range Status   MRSA by PCR NEGATIVE NEGATIVE Final    Comment:        The GeneXpert MRSA Assay (FDA approved for NASAL specimens only), is one component of a comprehensive MRSA colonization surveillance program. It is not intended to diagnose MRSA infection nor to guide or monitor treatment for MRSA infections.      Discharge Instructions:   Discharge Instructions    Call MD for:  extreme fatigue    Complete by:  As directed    Call MD for:  persistant dizziness or light-headedness    Complete by:  As directed    Call MD for:  persistant nausea and vomiting    Complete by:  As directed    Call MD for:  severe uncontrolled pain    Complete by:  As directed    Diet - low sodium heart healthy    Complete by:  As directed    Renal   Diet Carb Modified    Complete by:  As directed    Increase activity slowly    Complete by:  As directed      Allergies as of 04/12/2016      Reactions  Cardura [doxazosin Mesylate] Other (See Comments)   Hallucinations      Medication List    TAKE these medications   ACCU-CHEK COMPACT STRIPS test strip Generic drug:  glucose blood   acetaminophen 500 MG tablet Commonly known as:  TYLENOL Take 1,000 mg by mouth daily as needed for headache.   allopurinol 100 MG tablet Commonly known as:  ZYLOPRIM Take 100 mg by mouth daily.   aspirin EC 325 MG tablet Take 325 mg by mouth daily.   calcitRIOL 0.25 MCG capsule Commonly known as:  ROCALTROL Take 0.25 mcg by mouth. Take 0.25 mcg on Mon, Wed, and Friday   diphenoxylate-atropine 2.5-0.025 MG tablet Commonly known as:  LOMOTIL TAKE 1 TABLET BY MOUTH EVERY 4 TO 6 HOURS AS NEEDED   doxycycline 100 MG tablet Commonly known as:  VIBRA-TABS Take 1 tablet (100 mg total) by mouth every 12 (twelve) hours.   LIPITOR 40 MG tablet Generic drug:  atorvastatin Take 40 mg by mouth daily.    metoprolol 100 MG tablet Commonly known as:  LOPRESSOR Take 100 mg by mouth 2 (two) times daily.   multivitamin Tabs tablet Take 1 tablet by mouth daily.   SENSIPAR 30 MG tablet Generic drug:  cinacalcet Take 30 mg by mouth daily.   sevelamer carbonate 800 MG tablet Commonly known as:  RENVELA Take 1,600-2,400 mg by mouth See admin instructions. Take 3 tablets (2400 mg) by mouth 3 times daily with meals and 2 tablets (1600 mg) twice daily with snacks      Follow-up Information    Servando Snare, MD Follow up in 1 month(s).   Specialties:  Vascular Surgery, Cardiology Why:  Follow up of aneurysm. Contact information: [address] [place] [address] [phone]            Time coordinating discharge: 35 minutes.  Signed:  Yussef Jorge  Pager [phone] [facility] 04/12/2016, 1:34 PM

## 2016-04-12 NOTE — Progress Notes (Signed)
Pt has been discharged home with daughter. IV and telemetry box removed. Pt and pt's daughter received discharge instructions and all questions were answered. Pt left with all of his belongings. Pt left the unit via wheelchair and was accompanied by a nurse tech and pt's daughter.   Grant Fontana BSN, RN

## 2016-04-13 ENCOUNTER — Other Ambulatory Visit: Payer: Self-pay

## 2016-04-13 DIAGNOSIS — I7100 Dissection of unspecified site of aorta: Secondary | ICD-10-CM

## 2016-04-20 ENCOUNTER — Ambulatory Visit
Admission: RE | Admit: 2016-04-20 | Discharge: 2016-04-20 | Disposition: A | Discharge status: Home / Self Care | Payer: Medicare Other | Source: Ambulatory Visit | Attending: Vascular Surgery | Admitting: Vascular Surgery

## 2016-04-20 DIAGNOSIS — I7100 Dissection of unspecified site of aorta: Secondary | ICD-10-CM

## 2016-04-20 MED ORDER — IOPAMIDOL (ISOVUE-370) INJECTION 76%: 75.0000 mL | Freq: Once | INTRAVENOUS | Status: DC | PRN

## 2016-05-08 ENCOUNTER — Encounter: Payer: Self-pay | Admitting: Vascular Surgery

## 2016-05-15 ENCOUNTER — Ambulatory Visit
Admission: RE | Admit: 2016-05-15 | Discharge: 2016-05-15 | Disposition: A | Discharge status: Home / Self Care | Payer: Medicare Other | Source: Ambulatory Visit | Attending: Vascular Surgery | Admitting: Vascular Surgery

## 2016-05-15 ENCOUNTER — Ambulatory Visit (INDEPENDENT_AMBULATORY_CARE_PROVIDER_SITE_OTHER): Payer: Medicare Other | Admitting: Vascular Surgery

## 2016-05-15 ENCOUNTER — Encounter: Payer: Self-pay | Admitting: Vascular Surgery

## 2016-05-15 VITALS — BP 160/90 | HR 102 | Temp 97.6°F | Resp 18 | Ht 66.0 in | Wt 203.0 lb

## 2016-05-15 DIAGNOSIS — Z992 Dependence on renal dialysis: Secondary | ICD-10-CM

## 2016-05-15 DIAGNOSIS — N186 End stage renal disease: Secondary | ICD-10-CM

## 2016-05-15 DIAGNOSIS — I714 Abdominal aortic aneurysm, without rupture, unspecified: Secondary | ICD-10-CM

## 2016-05-15 MED ORDER — IOPAMIDOL (ISOVUE-370) INJECTION 76%: 75.0000 mL | Freq: Once | INTRAVENOUS | Status: DC | PRN

## 2016-05-15 NOTE — Progress Notes (Signed)
Patient ID: Sean Best, male   DOB: May 18, 1933, 83 y.o.   MRN: [id number]  Reason for Consult: Re-evaluation (s/p CT)   Referred by Seward Carol, MD  Subjective:     HPI:  Sean Best is a 83 y.o. male first evaluated in the hospital with chest pain and found to have a 3.8cm AAA with thrombosed dissection of his visceral segment. He now follows up with repeat CT scan. He has not had abdominal or back pain continues to dialyze via left arm fistula. He is eating well without fevers chills nausea or vomiting is having normal bowel function. He continues to ambulate without bilateral lower extremity pain.  Past Medical History:  Diagnosis Date  . Anemia   . Arthritis    "knees" (04/11/2016)  . Blind left eye    S/P trauma  . Chronic total occlusion of artery of the extremities (HCC)    pt not aware of this  . Claudication ([place])   . ESRD on dialysis [facility])    "MWF; [name]" ((04/10/2016)  . ESRD on peritoneal dialysis [facility])    Started dialysis around April 2015 per son.  Has been doing peritoneal dialysis at home.     . Gout   . Hyperlipidemia   . Hypertension   . Overweight(278.02)   . Prostate cancer ([place]) 1990s  . Shingles   . Stroke [facility]) ~1998   denies residual on 04/11/2016  . Type II diabetes mellitus (HCC)    Family History  Problem Relation Age of Onset  . Cancer Mother   . Heart disease Mother   . Cancer Father    Past Surgical History:  Procedure Laterality Date  . AV FISTULA PLACEMENT, RADIOCEPHALIC  18/84/1660   Left arm  . CAPD INSERTION N/A 03/06/2013   Procedure: LAPAROSCOPIC INSERTION CONTINUOUS AMBULATORY PERITONEAL DIALYSIS  (CAPD) CATHETER;  Surgeon: Edward Jolly, MD;  Location: [place];  Service: General;  Laterality: N/A;  . CATARACT EXTRACTION Right   . EYE SURGERY Left 1970s   for eye injury  . HERNIA REPAIR    . INSERTION PROSTATE RADIATION SEED    . UMBILICAL HERNIA REPAIR N/A 03/06/2013   Procedure: HERNIA REPAIR UMBILICAL WITH  MESH;  Surgeon: Edward Jolly, MD;  Location: [facility] OR;  Service: General;  Laterality: N/A;    Short Social History:  Social History  Substance Use Topics  . Smoking status: Never Smoker  . Smokeless tobacco: Never Used  . Alcohol use No    Allergies  Allergen Reactions  . Cardura [Doxazosin Mesylate] Other (See Comments)    Hallucinations    Current Outpatient Prescriptions  Medication Sig Dispense Refill  . ACCU-CHEK COMPACT STRIPS test strip     . acetaminophen (TYLENOL) 500 MG tablet Take 1,000 mg by mouth daily as needed for headache.    . allopurinol (ZYLOPRIM) 100 MG tablet Take 100 mg by mouth daily.      Marland Kitchen aspirin EC 325 MG tablet Take 325 mg by mouth daily.    Marland Kitchen atorvastatin (LIPITOR) 40 MG tablet Take 40 mg by mouth daily.      . diphenoxylate-atropine (LOMOTIL) 2.5-0.025 MG per tablet TAKE 1 TABLET BY MOUTH EVERY 4 TO 6 HOURS AS NEEDED 30 tablet 0  . metoprolol (LOPRESSOR) 100 MG tablet Take 100 mg by mouth 2 (two) times daily.    . multivitamin (RENA-VIT) TABS tablet Take 1 tablet by mouth daily.    . SENSIPAR 30 MG tablet Take 30 mg by  mouth daily.    . sevelamer carbonate (RENVELA) 800 MG tablet Take 1,600-2,400 mg by mouth See admin instructions. Take 3 tablets (2400 mg) by mouth 3 times daily with meals and 2 tablets (1600 mg) twice daily with snacks    . calcitRIOL (ROCALTROL) 0.25 MCG capsule Take 0.25 mcg by mouth. Take 0.25 mcg on Mon, Wed, and Friday  6  . doxycycline (VIBRA-TABS) 100 MG tablet Take 1 tablet (100 mg total) by mouth every 12 (twelve) hours. (Patient not taking: Reported on 05/15/2016) 10 tablet 0   No current facility-administered medications for this visit.    Facility-Administered Medications Ordered in Other Visits  Medication Dose Route Frequency Provider Last Rate Last Dose  . iopamidol (ISOVUE-370) 76 % injection 75 mL  75 mL Intravenous Once PRN Waynetta Sandy, MD        Review of Systems  Constitutional:   Constitutional negative. Eyes: Eyes negative.  Respiratory: Positive for cough.  Cardiovascular: Cardiovascular negative.  GI: Gastrointestinal negative.  Musculoskeletal: Musculoskeletal negative.  Skin: Skin negative.  Neurological: Neurological negative. Hematologic: Hematologic/lymphatic negative.  Psychiatric: Psychiatric negative.        Objective:  Objective   Vitals:   05/15/16 1026 05/15/16 1028  BP: (!) 187/95 (!) 160/90  Pulse: 99 (!) 102  Resp: 18   Temp: 97.6 F (36.4 C)   SpO2: 99%   Weight: 203 lb (92.1 kg)   Height: 5\' 6"  (1.676 m)    Body mass index is 32.77 kg/m.  Physical Exam  Constitutional: He is oriented to person, place, and time. He appears well-developed.  HENT:  Head: Normocephalic.  Eyes: Pupils are equal, round, and reactive to light.  Neck: Normal range of motion.  Cardiovascular: Normal rate and regular rhythm.   Pulses:      Popliteal pulses are 2+ on the right side, and 2+ on the left side.  Pulmonary/Chest: Effort normal.  Abdominal: Soft. He exhibits no mass.  Musculoskeletal: Normal range of motion. He exhibits no edema.  Palpable thrill in left forearm avf  Lymphadenopathy:    He has no cervical adenopathy.  Neurological: He is alert and oriented to person, place, and time.  Skin: Skin is warm and dry.  Psychiatric: He has a normal mood and affect. His behavior is normal. Judgment and thought content normal.    Data: CT reviewed by me today, no formal radiology read yet But appears to have stable 3.8 cm infrarenal aneurysm and "thrombosed dissection "of his visceral segment.     Assessment/Plan:     83 year old male follows up with repeat CT scan for infrarenal aneurysm that is small with a stable dissection near the visceral component. He is not having symptoms at this time and can follow-up in 1 year with repeat CT angina. He also has readily palpable popliteal pulses bilaterally and we will get lower extremity duplexes to  evaluate these. Should he have new abdominal or back pain or lower extremity pain he should seek immediate care and his understanding of this. He continues to dialyze via his fistula on his left arm and this is a palpable thrill today.     Waynetta Sandy MD [facility]

## 2016-05-15 NOTE — Progress Notes (Signed)
Vitals:   05/15/16 1026  BP: (!) 187/95  Pulse: 99  Resp: 18  Temp: 97.6 F (36.4 C)  SpO2: 99%  Weight: 203 lb (92.1 kg)  Height: 5\' 6"  (1.676 m)

## 2016-05-19 NOTE — Addendum Note (Signed)
Addended by: Lianne Cure A on: 05/19/2016 09:28 AM   Modules accepted: Orders

## 2017-02-28 ENCOUNTER — Encounter (HOSPITAL_COMMUNITY): Payer: Self-pay | Admitting: Emergency Medicine

## 2017-02-28 ENCOUNTER — Emergency Department (HOSPITAL_COMMUNITY)
Admission: EM | Admit: 2017-02-28 | Discharge: 2017-03-01 | Disposition: A | Discharge status: Home / Self Care | Payer: Medicare Other | Attending: Emergency Medicine | Admitting: Emergency Medicine

## 2017-02-28 ENCOUNTER — Emergency Department (HOSPITAL_COMMUNITY): Payer: Medicare Other

## 2017-02-28 ENCOUNTER — Other Ambulatory Visit: Payer: Self-pay

## 2017-02-28 DIAGNOSIS — R1010 Upper abdominal pain, unspecified: Secondary | ICD-10-CM

## 2017-02-28 DIAGNOSIS — K8050 Calculus of bile duct without cholangitis or cholecystitis without obstruction: Secondary | ICD-10-CM

## 2017-02-28 DIAGNOSIS — I120 Hypertensive chronic kidney disease with stage 5 chronic kidney disease or end stage renal disease: Secondary | ICD-10-CM | POA: Insufficient documentation

## 2017-02-28 DIAGNOSIS — Z7982 Long term (current) use of aspirin: Secondary | ICD-10-CM | POA: Diagnosis not present

## 2017-02-28 DIAGNOSIS — Z992 Dependence on renal dialysis: Secondary | ICD-10-CM | POA: Insufficient documentation

## 2017-02-28 DIAGNOSIS — R079 Chest pain, unspecified: Secondary | ICD-10-CM

## 2017-02-28 DIAGNOSIS — E785 Hyperlipidemia, unspecified: Secondary | ICD-10-CM | POA: Insufficient documentation

## 2017-02-28 DIAGNOSIS — E119 Type 2 diabetes mellitus without complications: Secondary | ICD-10-CM | POA: Diagnosis not present

## 2017-02-28 DIAGNOSIS — Z79899 Other long term (current) drug therapy: Secondary | ICD-10-CM | POA: Diagnosis not present

## 2017-02-28 DIAGNOSIS — N186 End stage renal disease: Secondary | ICD-10-CM | POA: Diagnosis not present

## 2017-02-28 DIAGNOSIS — R109 Unspecified abdominal pain: Secondary | ICD-10-CM

## 2017-02-28 DIAGNOSIS — Z8673 Personal history of transient ischemic attack (TIA), and cerebral infarction without residual deficits: Secondary | ICD-10-CM | POA: Insufficient documentation

## 2017-02-28 DIAGNOSIS — K8051 Calculus of bile duct without cholangitis or cholecystitis with obstruction: Secondary | ICD-10-CM | POA: Insufficient documentation

## 2017-02-28 DIAGNOSIS — Z8546 Personal history of malignant neoplasm of prostate: Secondary | ICD-10-CM | POA: Insufficient documentation

## 2017-02-28 LAB — CBC
HEMATOCRIT: 35.5 % — AB (ref 39.0–52.0)
HEMOGLOBIN: 11.4 g/dL — AB (ref 13.0–17.0)
MCH: 30.6 pg (ref 26.0–34.0)
MCHC: 32.1 g/dL (ref 30.0–36.0)
MCV: 95.4 fL (ref 78.0–100.0)
Platelets: 261 10*3/uL (ref 150–400)
RBC: 3.72 MIL/uL — ABNORMAL LOW (ref 4.22–5.81)
RDW: 15.9 % — AB (ref 11.5–15.5)
WBC: 8.3 10*3/uL (ref 4.0–10.5)

## 2017-02-28 LAB — BASIC METABOLIC PANEL
ANION GAP: 13 (ref 5–15)
BUN: 16 mg/dL (ref 6–20)
CO2: 33 mmol/L — ABNORMAL HIGH (ref 22–32)
Calcium: 9.5 mg/dL (ref 8.9–10.3)
Chloride: 91 mmol/L — ABNORMAL LOW (ref 101–111)
Creatinine, Ser: 5.74 mg/dL — ABNORMAL HIGH (ref 0.61–1.24)
GFR, EST AFRICAN AMERICAN: 10 mL/min — AB (ref >60)
GFR, EST NON AFRICAN AMERICAN: 8 mL/min — AB (ref >60)
Glucose, Bld: 148 mg/dL — ABNORMAL HIGH (ref 65–99)
POTASSIUM: 3.6 mmol/L (ref 3.5–5.1)
SODIUM: 137 mmol/L (ref 135–145)

## 2017-02-28 LAB — I-STAT TROPONIN, ED
TROPONIN I, POC: 0.02 ng/mL (ref 0.00–0.08)
Troponin i, poc: 0.01 ng/mL (ref 0.00–0.08)

## 2017-02-28 MED ORDER — FAMOTIDINE 20 MG PO TABS
20.0000 mg | ORAL_TABLET | Freq: Once | ORAL | Status: AC
  Administered 2017-02-28: 20 mg via ORAL
  Filled 2017-02-28: qty 1

## 2017-02-28 MED ORDER — ALUM & MAG HYDROXIDE-SIMETH 200-200-20 MG/5ML PO SUSP
15.0000 mL | Freq: Once | ORAL | Status: AC
  Administered 2017-02-28: 15 mL via ORAL
  Filled 2017-02-28: qty 30

## 2017-02-28 MED ORDER — HYDROMORPHONE HCL 1 MG/ML IJ SOLN
1.0000 mg | Freq: Once | INTRAMUSCULAR | Status: AC
  Administered 2017-03-01: 1 mg via INTRAVENOUS
  Filled 2017-02-28: qty 1

## 2017-02-28 MED ORDER — IOPAMIDOL (ISOVUE-370) INJECTION 76%
INTRAVENOUS | Status: AC
  Administered 2017-03-01: 100 mL
  Filled 2017-02-28: qty 100

## 2017-02-28 MED ORDER — TRAMADOL HCL 50 MG PO TABS
50.0000 mg | ORAL_TABLET | Freq: Once | ORAL | Status: AC
  Administered 2017-02-28: 50 mg via ORAL
  Filled 2017-02-28: qty 1

## 2017-02-28 MED ORDER — ONDANSETRON HCL 4 MG/2ML IJ SOLN
4.0000 mg | Freq: Once | INTRAMUSCULAR | Status: AC
  Administered 2017-03-01: 4 mg via INTRAVENOUS
  Filled 2017-02-28: qty 2

## 2017-02-28 NOTE — ED Notes (Signed)
Patient transported to X-ray 

## 2017-02-28 NOTE — ED Notes (Signed)
ED Provider at bedside. 

## 2017-02-28 NOTE — ED Triage Notes (Signed)
Per EMS pt got home from dialysis today at 215. At 230 began having centralized chest pain. Felt like indigestion he'd had in the past. Pt was given 324 ASA and 2 nitro en route with no relief in pain but a decrease in blood pressure from 200/120 initially to 102/63.  HR 70s, 99% on RA, CBG 143

## 2017-02-28 NOTE — ED Provider Notes (Addendum)
[facility] EMERGENCY DEPARTMENT Provider Note   CSN: [id number] Arrival date & time: 02/28/17  2005     History   Chief Complaint Chief Complaint  Patient presents with  . Chest Pain    HPI Sean Best is a 83 y.o. male.  Patient c/o mid chest pain at rest for past 7 hours. Pain constant, dull, moderate, non radiating. Pt felt was heartburn. Had an episode of emesis, not bloody or bilious. No diaphoresis or sob. No pleuritic pain. Pain is midline, lower sternal area. No hx same pain. Denies hx cad. No back or flank pain. No abd pain. No fever or chills. Denies cough or uri c/o. No chest wall injury or strain. No leg pain or swelling.    The history is provided by the patient and the EMS personnel.  Chest Pain   Pertinent negatives include no abdominal pain, no back pain, no cough, no fever, no headaches and no shortness of breath.    Past Medical History:  Diagnosis Date  . Anemia   . Arthritis    "knees" (04/11/2016)  . Blind left eye    S/P trauma  . Chronic total occlusion of artery of the extremities (HCC)    pt not aware of this  . Claudication ([place])   . ESRD on dialysis [facility])    "MWF; [name]" ((04/10/2016)  . ESRD on peritoneal dialysis [facility])    Started dialysis around April 2015 per son.  Has been doing peritoneal dialysis at home.     . Gout   . Hyperlipidemia   . Hypertension   . Overweight(278.02)   . Prostate cancer ([place]) 1990s  . Shingles   . Stroke [facility]) ~1998   denies residual on 04/11/2016  . Type II diabetes mellitus [facility])     Patient Active Problem List   Diagnosis Date Noted  . ESRD on hemodialysis ([place]) 04/12/2016  . H/O: stroke 04/11/2016  . Saccular aneurysm of infrarenal aorta with thrombosed dissection of abdominal aorta 04/11/2016  . Obesity (BMI 30-39.9) 04/11/2016  . Syncope 04/11/2016  . Prostate CA ([place]) 12/28/2014  . Chest pain 12/28/2014  . Anemia of chronic disease 03/11/2013  . DM (diabetes  mellitus), type 2 with renal complications, diet controlled 03/11/2013  . Benign hypertension 03/11/2013  . Hyperlipidemia 03/11/2013  . Umbilical hernia 12/45/8099    Past Surgical History:  Procedure Laterality Date  . AV FISTULA PLACEMENT, RADIOCEPHALIC  83/38/2505   Left arm  . CAPD INSERTION N/A 03/06/2013   Procedure: LAPAROSCOPIC INSERTION CONTINUOUS AMBULATORY PERITONEAL DIALYSIS  (CAPD) CATHETER;  Surgeon: Edward Jolly, MD;  Location: [facility];  Service: General;  Laterality: N/A;  . CATARACT EXTRACTION Right   . EYE SURGERY Left 1970s   for eye injury  . HERNIA REPAIR    . INSERTION PROSTATE RADIATION SEED    . UMBILICAL HERNIA REPAIR N/A 03/06/2013   Procedure: HERNIA REPAIR UMBILICAL WITH MESH;  Surgeon: Edward Jolly, MD;  Location: [facility] OR;  Service: General;  Laterality: N/A;       Home Medications    Prior to Admission medications   Medication Sig Start Date End Date Taking? Authorizing Provider  acetaminophen (TYLENOL) 500 MG tablet Take 1,000 mg by mouth daily as needed for headache.   Yes [provider]  allopurinol (ZYLOPRIM) 100 MG tablet Take 100 mg by mouth daily.     Yes [provider]  amLODipine (NORVASC) 5 MG tablet Take 5 mg by mouth at  bedtime. 01/04/17  Yes [provider]  aspirin EC 325 MG tablet Take 325 mg by mouth daily.   Yes [provider]  atorvastatin (LIPITOR) 40 MG tablet Take 40 mg by mouth daily.     Yes [provider]  calcitRIOL (ROCALTROL) 0.25 MCG capsule Take 0.25 mcg by mouth. Jory Sims, and Friday 03/13/14  Yes [provider]  diphenoxylate-atropine (LOMOTIL) 2.5-0.025 MG per tablet TAKE 1 TABLET BY MOUTH EVERY 4 TO 6 HOURS AS NEEDED Patient taking differently: TAKE 1 TABLET BY MOUTH EVERY 4 TO 6 HOURS AS NEEDED loose stool 10/09/14  Yes Zehr, Janett Billow D, PA-C  gabapentin (NEURONTIN) 100 MG capsule Take 100 mg by mouth at bedtime. 12/27/16  Yes [provider]    metoprolol succinate (TOPROL-XL) 100 MG 24 hr tablet Take 100 mg by mouth at bedtime. 02/24/17  Yes [provider]  multivitamin (RENA-VIT) TABS tablet Take 1 tablet by mouth daily.   Yes [provider]  SENSIPAR 30 MG tablet Take 30 mg by mouth daily. 12/12/14  Yes [provider]  sevelamer carbonate (RENVELA) 800 MG tablet Take 1,600-2,400 mg by mouth See admin instructions. Take 3 tablets (2400 mg) by mouth 3 times daily with meals and 2 tablets (1600 mg) twice daily with snacks   Yes [provider]    Family History Family History  Problem Relation Age of Onset  . Cancer Mother   . Heart disease Mother   . Cancer Father     Social History Social History   Tobacco Use  . Smoking status: Never Smoker  . Smokeless tobacco: Never Used  Substance Use Topics  . Alcohol use: No  . Drug use: No     Allergies   Cardura [doxazosin mesylate]   Review of Systems Review of Systems  Constitutional: Negative for fever.  HENT: Negative for sore throat.   Eyes: Negative for redness.  Respiratory: Negative for cough and shortness of breath.   Cardiovascular: Positive for chest pain.  Gastrointestinal: Negative for abdominal pain.  Genitourinary: Negative for flank pain.  Musculoskeletal: Negative for back pain and neck pain.  Skin: Negative for rash.  Neurological: Negative for headaches.  Hematological: Does not bruise/bleed easily.  Psychiatric/Behavioral: Negative for confusion.     Physical Exam Updated Vital Signs BP (!) 188/94   Pulse 70   Temp 97.9 F (36.6 C) (Oral)   Resp 19   Ht 1.651 m (5\' 5" )   Wt 108.9 kg (240 lb)   SpO2 100%   BMI 39.94 kg/m   Physical Exam  Constitutional: He is oriented to person, place, and time. He appears well-developed and well-nourished. No distress.  HENT:  Mouth/Throat: Oropharynx is clear and moist.  Eyes: Conjunctivae are normal.  Neck: Neck supple. No tracheal deviation present.   Cardiovascular: Normal rate, regular rhythm, normal heart sounds and intact distal pulses. Exam reveals no gallop and no friction rub.  No murmur heard. Pulmonary/Chest: Effort normal and breath sounds normal. No accessory muscle usage. No respiratory distress. He exhibits tenderness.  Abdominal: Soft. Bowel sounds are normal. He exhibits no distension. There is no tenderness.  Musculoskeletal: He exhibits no edema or tenderness.  LUE HD fistula w palp thrill.   Neurological: He is alert and oriented to person, place, and time.  Skin: Skin is warm and dry. No rash noted. He is not diaphoretic.  Psychiatric: He has a normal mood and affect.  Nursing note and vitals reviewed.    ED  Treatments / Results  Labs (all labs ordered are listed, but only abnormal results are displayed) Results for orders placed or performed during the hospital encounter of 60/10/93  Basic metabolic panel  Result Value Ref Range   Sodium 137 135 - 145 mmol/L   Potassium 3.6 3.5 - 5.1 mmol/L   Chloride 91 (L) 101 - 111 mmol/L   CO2 33 (H) 22 - 32 mmol/L   Glucose, Bld 148 (H) 65 - 99 mg/dL   BUN 16 6 - 20 mg/dL   Creatinine, Ser 5.74 (H) 0.61 - 1.24 mg/dL   Calcium 9.5 8.9 - 10.3 mg/dL   GFR calc non Af Amer 8 (L) >60 mL/min   GFR calc Af Amer 10 (L) >60 mL/min   Anion gap 13 5 - 15  CBC  Result Value Ref Range   WBC 8.3 4.0 - 10.5 K/uL   RBC 3.72 (L) 4.22 - 5.81 MIL/uL   Hemoglobin 11.4 (L) 13.0 - 17.0 g/dL   HCT 35.5 (L) 39.0 - 52.0 %   MCV 95.4 78.0 - 100.0 fL   MCH 30.6 26.0 - 34.0 pg   MCHC 32.1 30.0 - 36.0 g/dL   RDW 15.9 (H) 11.5 - 15.5 %   Platelets 261 150 - 400 K/uL  I-stat troponin, ED  Result Value Ref Range   Troponin i, poc 0.01 0.00 - 0.08 ng/mL   Comment 3          I-stat troponin, ED  Result Value Ref Range   Troponin i, poc 0.02 0.00 - 0.08 ng/mL   Comment 3           Dg Chest 2 View  Result Date: 02/28/2017 CLINICAL DATA:  Chest pain tonight EXAM: CHEST  2 VIEW  COMPARISON:  April 10, 2016 FINDINGS: The heart size and mediastinal contours are stable. The heart size is enlarged. Both lungs are clear. The visualized skeletal structures are unremarkable. IMPRESSION: No active cardiopulmonary disease.  Cardiomegaly. Electronically Signed   By: Abelardo Diesel M.D.   On: 02/28/2017 20:55    EKG  EKG Interpretation  Date/Time:  Sunday February 28 2017 20:09:56 EST Ventricular Rate:  80 PR Interval:  180 QRS Duration: 98 QT Interval:  416 QTC Calculation: 479 R Axis:   -21 Text Interpretation:  Sinus rhythm with Premature supraventricular complexes Incomplete right bundle branch block No significant change since last tracing Confirmed by Lajean Saver [phone]) on 02/28/2017 8:33:14 PM       Radiology Dg Chest 2 View  Result Date: 02/28/2017 CLINICAL DATA:  Chest pain tonight EXAM: CHEST  2 VIEW COMPARISON:  April 10, 2016 FINDINGS: The heart size and mediastinal contours are stable. The heart size is enlarged. Both lungs are clear. The visualized skeletal structures are unremarkable. IMPRESSION: No active cardiopulmonary disease.  Cardiomegaly. Electronically Signed   By: Abelardo Diesel M.D.   On: 02/28/2017 20:55    Procedures Procedures (including critical care time)  Medications Ordered in ED Medications  traMADol (ULTRAM) tablet 50 mg (50 mg Oral Given 02/28/17 2130)  famotidine (PEPCID) tablet 20 mg (20 mg Oral Given 02/28/17 2130)  alum & mag hydroxide-simeth (MAALOX/MYLANTA) 200-200-20 MG/5ML suspension 15 mL (15 mLs Oral Given 02/28/17 2129)     Initial Impression / Assessment and Plan / ED Course  I have reviewed the triage vital signs and the nursing notes.  Pertinent labs & imaging results that were available during my care of the patient were reviewed by me and considered in  my medical decision making (see chart for details).  Labs. Iv ns. Cxr. Ecg.  Reviewed nursing notes and prior charts for additional history.   Pt feels  is heartburn - will try meds for symptom relief.    Initial trop negative/normal.   Initially chest pain mid to lower chest/midline - while in ED, pain moves also to include abd. Repeat abd exam soft nt. Pt appears very comfortable. Pt does have aortic aneurysm.  Will get ct imaging r/o dissection.  Also added hepatic fxn panel and lipase to labs (pt also noted w cholelithiasis on prior imaging).   Patient signed out to Dr Venora Maples to check CTA result and pending labs, and dispo appropriately.    Final Clinical Impressions(s) / ED Diagnoses   Final diagnoses:  None    ED Discharge Orders    None            Lajean Saver, MD 02/28/17 2355

## 2017-03-01 ENCOUNTER — Emergency Department (HOSPITAL_COMMUNITY): Payer: Medicare Other

## 2017-03-01 LAB — HEPATIC FUNCTION PANEL
ALBUMIN: 3.6 g/dL (ref 3.5–5.0)
ALK PHOS: 69 U/L (ref 38–126)
ALT: 11 U/L — AB (ref 17–63)
AST: 19 U/L (ref 15–41)
BILIRUBIN TOTAL: 0.6 mg/dL (ref 0.3–1.2)
Total Protein: 7 g/dL (ref 6.5–8.1)

## 2017-03-01 LAB — LIPASE, BLOOD: Lipase: 54 U/L — ABNORMAL HIGH (ref 11–51)

## 2017-03-01 MED ORDER — ONDANSETRON 8 MG PO TBDP: 8.0000 mg | ORAL_TABLET | Freq: Three times a day (TID) | ORAL | 0 refills | Status: DC | PRN

## 2017-03-01 MED ORDER — AMLODIPINE BESYLATE 5 MG PO TABS
5.0000 mg | ORAL_TABLET | Freq: Once | ORAL | Status: AC
  Administered 2017-03-01: 5 mg via ORAL
  Filled 2017-03-01: qty 1

## 2017-03-01 MED ORDER — METOPROLOL SUCCINATE ER 100 MG PO TB24
100.0000 mg | ORAL_TABLET | Freq: Every day | ORAL | Status: DC
  Administered 2017-03-01: 100 mg via ORAL
  Filled 2017-03-01: qty 1

## 2017-03-01 NOTE — ED Provider Notes (Signed)
6:26 AM Patient feels much better this time.  Repeat abdominal exam without focal tenderness.  No focal right upper quadrant tenderness.  Feels much better.  Symptoms earlier today were likely related to biliary colic.  Gallstones noted.  No signs of cholecystitis.  Pain resolved.  Discharged home in good condition.  Primary care follow-up.  Patient given referral to general surgery.  He understands return to the emergency department for new or worsening symptoms.  I personally reviewed the imaging tests through PACS system I reviewed available ER/hospitalization records through the EMR   Dg Chest 2 View  Result Date: 02/28/2017 CLINICAL DATA:  Chest pain tonight EXAM: CHEST  2 VIEW COMPARISON:  April 10, 2016 FINDINGS: The heart size and mediastinal contours are stable. The heart size is enlarged. Both lungs are clear. The visualized skeletal structures are unremarkable. IMPRESSION: No active cardiopulmonary disease.  Cardiomegaly. Electronically Signed   By: Abelardo Diesel M.D.   On: 02/28/2017 20:55   Ct Angio Chest/abd/pel For Dissection W And/or W/wo  Result Date: 03/01/2017 CLINICAL DATA:  Acute onset of central chest pain after dialysis. EXAM: CT ANGIOGRAPHY CHEST, ABDOMEN AND PELVIS TECHNIQUE: Multidetector CT imaging through the chest, abdomen and pelvis was performed using the standard protocol during bolus administration of intravenous contrast. Multiplanar reconstructed images and MIPs were obtained and reviewed to evaluate the vascular anatomy. CONTRAST:  163mL ISOVUE-370 IOPAMIDOL (ISOVUE-370) INJECTION 76% COMPARISON:  None. FINDINGS: CTA CHEST FINDINGS Cardiovascular: There is no evidence of aortic dissection. There is no evidence of aneurysmal dilatation. The thoracic aorta is borderline normal in caliber. Mild calcification is seen along the aortic arch and descending thoracic aorta. Mild calcification is seen along the subclavian arteries bilaterally. There is no evidence of  significant pulmonary embolus. Diffuse coronary artery calcifications are seen. Mild calcification is noted at the aortic and mitral valves. The heart is borderline enlarged. Mediastinum/Nodes: The mediastinum is otherwise unremarkable in appearance. No mediastinal lymphadenopathy is seen. No pericardial effusion is identified. The visualized portions of the thyroid gland are unremarkable. No axillary lymphadenopathy is seen. Lungs/Pleura: A few blebs are seen at the right lung base. Mild bilateral subsegmental atelectasis is noted. No pleural effusion or pneumothorax is seen. No masses are identified. Musculoskeletal: No acute osseous abnormalities are identified. The visualized musculature is unremarkable in appearance. Review of the MIP images confirms the above findings. CTA ABDOMEN AND PELVIS FINDINGS VASCULAR Aorta: There is aneurysmal dilatation of the infrarenal abdominal aorta to 3.5 cm in AP dimension, with ectasia of the common iliac arteries bilaterally. There is no evidence of aortic dissection. Scattered calcification is seen along the abdominal aorta and its branches, with mild mural thrombus just above the level of the renal arteries. Celiac: Mild calcification is noted at the origin of the celiac trunk. The celiac trunk remains patent. SMA: Scattered calcification is noted along the superior mesenteric artery, without significant luminal narrowing. Renals: Scattered calcification and mild mural thrombus are seen along the renal arteries, though they appear patent bilaterally. IMA: The inferior mesenteric artery remains grossly patent. Inflow: Mild mural thrombus is seen along the left common iliac artery, without significant luminal narrowing. The internal and external iliac arteries remain patent. The common femoral arteries are grossly unremarkable in appearance. Veins: Visualized venous structures are grossly unremarkable. Review of the MIP images confirms the above findings. NON-VASCULAR  Hepatobiliary: The liver is unremarkable in appearance. The gallbladder is diffusely distended, with small stones seen dependently in the gallbladder. The common bile duct is borderline  normal in caliber. Pancreas: The pancreas is within normal limits. Spleen: The spleen is unremarkable in appearance. Adrenals/Urinary Tract: The adrenal glands are unremarkable in appearance. Moderate bilateral renal atrophy is noted, with scattered bilateral cysts and hyperdense lesions. No renal or ureteral stones are identified. No significant perinephric stranding is seen. Stomach/Bowel: The stomach is unremarkable in appearance. The small bowel is within normal limits. The appendix is normal in caliber, without evidence of appendicitis. The colon is unremarkable in appearance. Lymphatic: No retroperitoneal or pelvic sidewall lymphadenopathy is seen. Reproductive: Brachytherapy seeds are seen at the prostate bed. The bladder is decompressed, with diffuse bladder wall thickening, raising concern for cystitis. Other: A small periumbilical abdominal hernia is noted just to the left of and superior to the umbilicus, containing only fat. Musculoskeletal: No acute osseous abnormalities are identified. Multilevel vacuum phenomenon is noted along the lumbar spine. The visualized musculature is unremarkable in appearance. Review of the MIP images confirms the above findings. IMPRESSION: 1. No evidence of aortic dissection. 2. Aneurysmal dilatation of the infrarenal abdominal aorta to 3.5 cm in AP dimension, with ectasia of the common iliac arteries bilaterally. Recommend followup by ultrasound in 2 years. This recommendation follows ACR consensus guidelines: White Paper of the ACR Incidental Findings Committee II on Vascular Findings. J Am Coll Radiol 2013; 10:789-794. 3. No evidence of significant pulmonary embolus. 4. Diffuse bladder wall thickening raises question for cystitis. 5. Borderline cardiomegaly. Diffuse coronary artery  calcifications seen. 6. Few blebs at the right lung base. Mild bilateral subsegmental atelectasis. Lungs otherwise clear. 7. Gallbladder distention, with cholelithiasis. Common bile duct is borderline normal in caliber. Would correlate with LFTs. 8. Moderate bilateral renal atrophy, with scattered bilateral renal cysts and hyperdense lesions. 9. Small periumbilical abdominal hernia superior and to the left of the umbilicus, containing only fat. Electronically Signed   By: Garald Balding M.D.   On: 03/01/2017 00:46   US Abdomen Limited Ruq  Result Date: 03/01/2017 CLINICAL DATA:  83 y/o  M; 1 day of abdominal pain. EXAM: ULTRASOUND ABDOMEN LIMITED RIGHT UPPER QUADRANT COMPARISON:  05/15/2016 CT abdomen and pelvis. FINDINGS: Gallbladder: Multiple gallstones measuring up to 4 mm. No gallbladder wall thickening or pericholecystic fluid. Negative sonographic Marcou's sign. Common bile duct: Diameter: 6.0 mm Liver: No focal lesion identified. Within normal limits in parenchymal echogenicity. Portal vein is patent on color Doppler imaging with normal direction of blood flow towards the liver. IMPRESSION: Cholelithiasis.  No findings of acute cholecystitis. Electronically Signed   By: Kristine Garbe M.D.   On: 03/01/2017 05:44      Jola Schmidt, MD 03/01/17 [phone]

## 2017-03-01 NOTE — ED Notes (Signed)
Patient transported to Ultrasound 

## 2017-03-01 NOTE — ED Notes (Signed)
Patient transported to CT 

## 2017-03-01 NOTE — ED Notes (Signed)
Pt returned from CT °

## 2017-03-01 NOTE — ED Notes (Signed)
Pt returned to room from Ultrasound.

## 2017-03-01 NOTE — ED Notes (Signed)
MD notified of pt's BP. 

## 2017-04-09 ENCOUNTER — Ambulatory Visit
Admission: RE | Admit: 2017-04-09 | Discharge: 2017-04-09 | Disposition: A | Discharge status: Home / Self Care | Payer: Medicare Other | Source: Ambulatory Visit | Attending: Nurse Practitioner | Admitting: Nurse Practitioner

## 2017-04-09 ENCOUNTER — Other Ambulatory Visit: Payer: Self-pay | Admitting: Nurse Practitioner

## 2017-04-09 DIAGNOSIS — R05 Cough: Secondary | ICD-10-CM

## 2017-04-09 DIAGNOSIS — R059 Cough, unspecified: Secondary | ICD-10-CM

## 2017-04-17 ENCOUNTER — Inpatient Hospital Stay (HOSPITAL_COMMUNITY): Payer: Medicare Other

## 2017-04-17 ENCOUNTER — Inpatient Hospital Stay (HOSPITAL_COMMUNITY)
Admission: EM | Admit: 2017-04-17 | Discharge: 2017-04-23 | DRG: 242 | Disposition: A | Discharge status: Home Health | Payer: Medicare Other | Attending: Family Medicine | Admitting: Family Medicine

## 2017-04-17 ENCOUNTER — Emergency Department (HOSPITAL_COMMUNITY): Payer: Medicare Other

## 2017-04-17 ENCOUNTER — Encounter (HOSPITAL_COMMUNITY): Payer: Self-pay | Admitting: Emergency Medicine

## 2017-04-17 DIAGNOSIS — E669 Obesity, unspecified: Secondary | ICD-10-CM | POA: Diagnosis present

## 2017-04-17 DIAGNOSIS — Z6836 Body mass index (BMI) 36.0-36.9, adult: Secondary | ICD-10-CM

## 2017-04-17 DIAGNOSIS — D638 Anemia in other chronic diseases classified elsewhere: Secondary | ICD-10-CM | POA: Diagnosis not present

## 2017-04-17 DIAGNOSIS — I442 Atrioventricular block, complete: Secondary | ICD-10-CM | POA: Diagnosis present

## 2017-04-17 DIAGNOSIS — Z8249 Family history of ischemic heart disease and other diseases of the circulatory system: Secondary | ICD-10-CM

## 2017-04-17 DIAGNOSIS — R062 Wheezing: Secondary | ICD-10-CM

## 2017-04-17 DIAGNOSIS — R7989 Other specified abnormal findings of blood chemistry: Secondary | ICD-10-CM | POA: Diagnosis present

## 2017-04-17 DIAGNOSIS — Z79899 Other long term (current) drug therapy: Secondary | ICD-10-CM

## 2017-04-17 DIAGNOSIS — Z8673 Personal history of transient ischemic attack (TIA), and cerebral infarction without residual deficits: Secondary | ICD-10-CM | POA: Diagnosis not present

## 2017-04-17 DIAGNOSIS — E785 Hyperlipidemia, unspecified: Secondary | ICD-10-CM | POA: Diagnosis present

## 2017-04-17 DIAGNOSIS — I959 Hypotension, unspecified: Secondary | ICD-10-CM | POA: Diagnosis present

## 2017-04-17 DIAGNOSIS — D649 Anemia, unspecified: Secondary | ICD-10-CM | POA: Diagnosis present

## 2017-04-17 DIAGNOSIS — Z8546 Personal history of malignant neoplasm of prostate: Secondary | ICD-10-CM

## 2017-04-17 DIAGNOSIS — I132 Hypertensive heart and chronic kidney disease with heart failure and with stage 5 chronic kidney disease, or end stage renal disease: Secondary | ICD-10-CM | POA: Diagnosis present

## 2017-04-17 DIAGNOSIS — K7200 Acute and subacute hepatic failure without coma: Secondary | ICD-10-CM | POA: Diagnosis present

## 2017-04-17 DIAGNOSIS — I459 Conduction disorder, unspecified: Secondary | ICD-10-CM | POA: Diagnosis not present

## 2017-04-17 DIAGNOSIS — E872 Acidosis: Secondary | ICD-10-CM | POA: Diagnosis present

## 2017-04-17 DIAGNOSIS — E875 Hyperkalemia: Secondary | ICD-10-CM | POA: Diagnosis present

## 2017-04-17 DIAGNOSIS — I10 Essential (primary) hypertension: Secondary | ICD-10-CM | POA: Diagnosis present

## 2017-04-17 DIAGNOSIS — H5462 Unqualified visual loss, left eye, normal vision right eye: Secondary | ICD-10-CM | POA: Diagnosis present

## 2017-04-17 DIAGNOSIS — E8770 Fluid overload, unspecified: Secondary | ICD-10-CM | POA: Diagnosis present

## 2017-04-17 DIAGNOSIS — R945 Abnormal results of liver function studies: Secondary | ICD-10-CM

## 2017-04-17 DIAGNOSIS — Z9115 Patient's noncompliance with renal dialysis: Secondary | ICD-10-CM

## 2017-04-17 DIAGNOSIS — Z992 Dependence on renal dialysis: Secondary | ICD-10-CM | POA: Diagnosis not present

## 2017-04-17 DIAGNOSIS — G8929 Other chronic pain: Secondary | ICD-10-CM | POA: Diagnosis present

## 2017-04-17 DIAGNOSIS — N2581 Secondary hyperparathyroidism of renal origin: Secondary | ICD-10-CM | POA: Diagnosis present

## 2017-04-17 DIAGNOSIS — R0600 Dyspnea, unspecified: Secondary | ICD-10-CM | POA: Diagnosis not present

## 2017-04-17 DIAGNOSIS — D631 Anemia in chronic kidney disease: Secondary | ICD-10-CM | POA: Diagnosis present

## 2017-04-17 DIAGNOSIS — Z950 Presence of cardiac pacemaker: Secondary | ICD-10-CM

## 2017-04-17 DIAGNOSIS — Z7982 Long term (current) use of aspirin: Secondary | ICD-10-CM

## 2017-04-17 DIAGNOSIS — I509 Heart failure, unspecified: Secondary | ICD-10-CM | POA: Diagnosis present

## 2017-04-17 DIAGNOSIS — I248 Other forms of acute ischemic heart disease: Secondary | ICD-10-CM | POA: Diagnosis present

## 2017-04-17 DIAGNOSIS — E1122 Type 2 diabetes mellitus with diabetic chronic kidney disease: Secondary | ICD-10-CM | POA: Diagnosis present

## 2017-04-17 DIAGNOSIS — R778 Other specified abnormalities of plasma proteins: Secondary | ICD-10-CM | POA: Diagnosis present

## 2017-04-17 DIAGNOSIS — J9690 Respiratory failure, unspecified, unspecified whether with hypoxia or hypercapnia: Secondary | ICD-10-CM

## 2017-04-17 DIAGNOSIS — N186 End stage renal disease: Secondary | ICD-10-CM

## 2017-04-17 DIAGNOSIS — R748 Abnormal levels of other serum enzymes: Secondary | ICD-10-CM | POA: Diagnosis not present

## 2017-04-17 DIAGNOSIS — E1129 Type 2 diabetes mellitus with other diabetic kidney complication: Secondary | ICD-10-CM | POA: Diagnosis present

## 2017-04-17 DIAGNOSIS — J810 Acute pulmonary edema: Secondary | ICD-10-CM

## 2017-04-17 DIAGNOSIS — K761 Chronic passive congestion of liver: Secondary | ICD-10-CM | POA: Diagnosis present

## 2017-04-17 DIAGNOSIS — I361 Nonrheumatic tricuspid (valve) insufficiency: Secondary | ICD-10-CM | POA: Diagnosis not present

## 2017-04-17 DIAGNOSIS — J9601 Acute respiratory failure with hypoxia: Secondary | ICD-10-CM | POA: Diagnosis present

## 2017-04-17 LAB — RENAL FUNCTION PANEL
ALBUMIN: 3.5 g/dL (ref 3.5–5.0)
Anion gap: 26 — ABNORMAL HIGH (ref 5–15)
BUN: 77 mg/dL — AB (ref 6–20)
CALCIUM: 9.3 mg/dL (ref 8.9–10.3)
CO2: 18 mmol/L — ABNORMAL LOW (ref 22–32)
CREATININE: 15.6 mg/dL — AB (ref 0.61–1.24)
Chloride: 96 mmol/L — ABNORMAL LOW (ref 101–111)
GFR calc Af Amer: 3 mL/min — ABNORMAL LOW (ref >60)
GFR, EST NON AFRICAN AMERICAN: 2 mL/min — AB (ref >60)
Glucose, Bld: 91 mg/dL (ref 65–99)
PHOSPHORUS: 8.4 mg/dL — AB (ref 2.5–4.6)
POTASSIUM: 5.9 mmol/L — AB (ref 3.5–5.1)
Sodium: 140 mmol/L (ref 135–145)

## 2017-04-17 LAB — COMPREHENSIVE METABOLIC PANEL
ALBUMIN: 3.5 g/dL (ref 3.5–5.0)
ALT: 233 U/L — ABNORMAL HIGH (ref 17–63)
ANION GAP: 24 — AB (ref 5–15)
AST: 286 U/L — ABNORMAL HIGH (ref 15–41)
Alkaline Phosphatase: 72 U/L (ref 38–126)
BUN: 74 mg/dL — ABNORMAL HIGH (ref 6–20)
CHLORIDE: 97 mmol/L — AB (ref 101–111)
CO2: 20 mmol/L — ABNORMAL LOW (ref 22–32)
Calcium: 9.4 mg/dL (ref 8.9–10.3)
Creatinine, Ser: 15.52 mg/dL — ABNORMAL HIGH (ref 0.61–1.24)
GFR calc Af Amer: 3 mL/min — ABNORMAL LOW (ref >60)
GFR, EST NON AFRICAN AMERICAN: 2 mL/min — AB (ref >60)
Glucose, Bld: 101 mg/dL — ABNORMAL HIGH (ref 65–99)
POTASSIUM: 5.8 mmol/L — AB (ref 3.5–5.1)
Sodium: 141 mmol/L (ref 135–145)
Total Bilirubin: 1 mg/dL (ref 0.3–1.2)
Total Protein: 7 g/dL (ref 6.5–8.1)

## 2017-04-17 LAB — CBC WITH DIFFERENTIAL/PLATELET
BASOS PCT: 1 %
Basophils Absolute: 0.1 10*3/uL (ref 0.0–0.1)
EOS PCT: 1 %
Eosinophils Absolute: 0.1 10*3/uL (ref 0.0–0.7)
HEMATOCRIT: 29 % — AB (ref 39.0–52.0)
Hemoglobin: 9 g/dL — ABNORMAL LOW (ref 13.0–17.0)
Lymphocytes Relative: 19 %
Lymphs Abs: 2.1 10*3/uL (ref 0.7–4.0)
MCH: 29.9 pg (ref 26.0–34.0)
MCHC: 31 g/dL (ref 30.0–36.0)
MCV: 96.3 fL (ref 78.0–100.0)
MONO ABS: 0.6 10*3/uL (ref 0.1–1.0)
MONOS PCT: 6 %
NEUTROS ABS: 8 10*3/uL — AB (ref 1.7–7.7)
Neutrophils Relative %: 73 %
PLATELETS: 349 10*3/uL (ref 150–400)
RBC: 3.01 MIL/uL — ABNORMAL LOW (ref 4.22–5.81)
RDW: 17.2 % — AB (ref 11.5–15.5)
WBC: 10.8 10*3/uL — ABNORMAL HIGH (ref 4.0–10.5)

## 2017-04-17 LAB — CBC
HEMATOCRIT: 27.2 % — AB (ref 39.0–52.0)
Hemoglobin: 8.6 g/dL — ABNORMAL LOW (ref 13.0–17.0)
MCH: 30.7 pg (ref 26.0–34.0)
MCHC: 31.6 g/dL (ref 30.0–36.0)
MCV: 97.1 fL (ref 78.0–100.0)
PLATELETS: 355 10*3/uL (ref 150–400)
RBC: 2.8 MIL/uL — ABNORMAL LOW (ref 4.22–5.81)
RDW: 17.7 % — AB (ref 11.5–15.5)
WBC: 10.3 10*3/uL (ref 4.0–10.5)

## 2017-04-17 LAB — BRAIN NATRIURETIC PEPTIDE: B NATRIURETIC PEPTIDE 5: 2812.2 pg/mL — AB (ref 0.0–100.0)

## 2017-04-17 LAB — TROPONIN I: Troponin I: 0.22 ng/mL (ref <0.03)

## 2017-04-17 LAB — INFLUENZA PANEL BY PCR (TYPE A & B)
Influenza A By PCR: NEGATIVE
Influenza B By PCR: NEGATIVE

## 2017-04-17 LAB — MAGNESIUM: MAGNESIUM: 3 mg/dL — AB (ref 1.7–2.4)

## 2017-04-17 MED ORDER — ONDANSETRON HCL 4 MG PO TABS: 4.0000 mg | ORAL_TABLET | Freq: Four times a day (QID) | ORAL | Status: DC | PRN

## 2017-04-17 MED ORDER — HEPARIN SODIUM (PORCINE) 1000 UNIT/ML DIALYSIS: 1000.0000 [IU] | INTRAMUSCULAR | Status: DC | PRN

## 2017-04-17 MED ORDER — LIDOCAINE HCL (PF) 1 % IJ SOLN
5.0000 mL | INTRAMUSCULAR | Status: DC | PRN
Start: 2017-04-17 — End: 2017-04-19

## 2017-04-17 MED ORDER — SEVELAMER CARBONATE 800 MG PO TABS
2400.0000 mg | ORAL_TABLET | Freq: Three times a day (TID) | ORAL | Status: DC
  Administered 2017-04-19 – 2017-04-23 (×8): 2400 mg via ORAL
  Filled 2017-04-17 (×9): qty 3

## 2017-04-17 MED ORDER — ATORVASTATIN CALCIUM 40 MG PO TABS
40.0000 mg | ORAL_TABLET | Freq: Every day | ORAL | Status: DC
  Filled 2017-04-17: qty 1

## 2017-04-17 MED ORDER — SODIUM CHLORIDE 0.9 % IV SOLN: 100.0000 mL | INTRAVENOUS | Status: DC | PRN

## 2017-04-17 MED ORDER — HEPARIN SODIUM (PORCINE) 1000 UNIT/ML DIALYSIS: 20.0000 [IU]/kg | INTRAMUSCULAR | Status: DC | PRN

## 2017-04-17 MED ORDER — PENTAFLUOROPROP-TETRAFLUOROETH EX AERO: 1.0000 "application " | INHALATION_SPRAY | CUTANEOUS | Status: DC | PRN

## 2017-04-17 MED ORDER — GABAPENTIN 100 MG PO CAPS
100.0000 mg | ORAL_CAPSULE | Freq: Every day | ORAL | Status: DC
  Administered 2017-04-17 – 2017-04-22 (×6): 100 mg via ORAL
  Filled 2017-04-17 (×6): qty 1

## 2017-04-17 MED ORDER — POLYETHYLENE GLYCOL 3350 17 G PO PACK
17.0000 g | PACK | Freq: Every day | ORAL | Status: DC | PRN
  Administered 2017-04-22: 17 g via ORAL
  Filled 2017-04-17: qty 1

## 2017-04-17 MED ORDER — ONDANSETRON HCL 4 MG/2ML IJ SOLN: 4.0000 mg | Freq: Four times a day (QID) | INTRAMUSCULAR | Status: DC | PRN

## 2017-04-17 MED ORDER — CINACALCET HCL 30 MG PO TABS
30.0000 mg | ORAL_TABLET | ORAL | Status: DC
  Administered 2017-04-19 – 2017-04-23 (×3): 30 mg via ORAL
  Filled 2017-04-17 (×2): qty 1

## 2017-04-17 MED ORDER — SODIUM CHLORIDE 0.9% FLUSH
3.0000 mL | Freq: Two times a day (BID) | INTRAVENOUS | Status: DC
  Administered 2017-04-18 – 2017-04-22 (×6): 3 mL via INTRAVENOUS

## 2017-04-17 MED ORDER — SEVELAMER CARBONATE 800 MG PO TABS: 1600.0000 mg | ORAL_TABLET | Freq: Two times a day (BID) | ORAL | Status: DC | PRN

## 2017-04-17 MED ORDER — GUAIFENESIN-DM 100-10 MG/5ML PO SYRP: 5.0000 mL | ORAL_SOLUTION | Freq: Four times a day (QID) | ORAL | Status: DC | PRN

## 2017-04-17 MED ORDER — LIDOCAINE-PRILOCAINE 2.5-2.5 % EX CREA: 1.0000 "application " | TOPICAL_CREAM | CUTANEOUS | Status: DC | PRN

## 2017-04-17 MED ORDER — SEVELAMER CARBONATE 800 MG PO TABS: 1600.0000 mg | ORAL_TABLET | ORAL | Status: DC

## 2017-04-17 MED ORDER — ACETAMINOPHEN 325 MG PO TABS
650.0000 mg | ORAL_TABLET | Freq: Four times a day (QID) | ORAL | Status: DC | PRN
Start: 2017-04-17 — End: 2017-04-19
  Administered 2017-04-19: 650 mg via ORAL
  Filled 2017-04-17: qty 2

## 2017-04-17 MED ORDER — HEPARIN SODIUM (PORCINE) 5000 UNIT/ML IJ SOLN
5000.0000 [IU] | Freq: Three times a day (TID) | INTRAMUSCULAR | Status: DC
  Administered 2017-04-17 – 2017-04-23 (×17): 5000 [IU] via SUBCUTANEOUS
  Filled 2017-04-17 (×18): qty 1

## 2017-04-17 MED ORDER — VITAMIN D 1000 UNITS PO TABS
1000.0000 [IU] | ORAL_TABLET | Freq: Every day | ORAL | Status: DC
  Administered 2017-04-19 – 2017-04-23 (×5): 1000 [IU] via ORAL
  Filled 2017-04-17 (×7): qty 1

## 2017-04-17 MED ORDER — SODIUM CHLORIDE 0.9 % IV SOLN
1.0000 g | Freq: Once | INTRAVENOUS | Status: AC
  Administered 2017-04-17: 1 g via INTRAVENOUS
  Filled 2017-04-17: qty 10

## 2017-04-17 MED ORDER — ACETAMINOPHEN 650 MG RE SUPP: 650.0000 mg | Freq: Four times a day (QID) | RECTAL | Status: DC | PRN

## 2017-04-17 MED ORDER — HYDROCODONE-ACETAMINOPHEN 5-325 MG PO TABS
1.0000 | ORAL_TABLET | ORAL | Status: DC | PRN
  Administered 2017-04-18: 2 via ORAL
  Filled 2017-04-17: qty 2

## 2017-04-17 MED ORDER — ALTEPLASE 2 MG IJ SOLR: 2.0000 mg | Freq: Once | INTRAMUSCULAR | Status: DC | PRN

## 2017-04-17 MED ORDER — ALLOPURINOL 100 MG PO TABS
100.0000 mg | ORAL_TABLET | Freq: Every day | ORAL | Status: DC
  Administered 2017-04-19 – 2017-04-23 (×5): 100 mg via ORAL
  Filled 2017-04-17 (×6): qty 1

## 2017-04-17 NOTE — Consult Note (Signed)
[facility] Renal Consultation Note    Indication for Consultation:  Management of ESRD/hemodialysis; anemia, hypertension/volume and secondary hyperparathyroidism  HPI: Sean Best is a 84 y.o. male.  Pt is an 84yo AAM with PMH significant for HTN, HLD, and ESRD on HD qMWF at [facility] who presents to the [facility] with 1 week history of cough, malaise, and increasing SOB and edema.  He has been treated for pneumonia over the last week but did not go to HD on Wednesday or Friday because he was "plumb sick" and couldn't go to dialysis.  We were consulted to provide HD due to his hyperkalemia and volume overload.  He was also noted to have a new heart block with HR in the 40's.  His potassium is mildly elevated and denies any previous issues with his heart.   Past Medical History:  Diagnosis Date  . Anemia   . Arthritis    "knees" (04/11/2016)  . Blind left eye    S/P trauma  . Chronic total occlusion of artery of the extremities (HCC)    pt not aware of this  . Claudication ([place])   . ESRD on dialysis [facility])    "MWF; [name]" ((04/10/2016)  . ESRD on peritoneal dialysis [facility])    Started dialysis around April 2015 per son.  Has been doing peritoneal dialysis at home.     . Gout   . Hyperlipidemia   . Hypertension   . Overweight(278.02)   . Prostate cancer ([employer]) 1990s  . Shingles   . Stroke [facility]) ~1998   denies residual on 04/11/2016  . Type II diabetes mellitus ([place])    Past Surgical History:  Procedure Laterality Date  . AV FISTULA PLACEMENT, RADIOCEPHALIC  96/78/9381   Left arm  . CAPD INSERTION N/A 03/06/2013   Procedure: LAPAROSCOPIC INSERTION CONTINUOUS AMBULATORY PERITONEAL DIALYSIS  (CAPD) CATHETER;  Surgeon: Edward Jolly, MD;  Location: [place];  Service: General;  Laterality: N/A;  . CATARACT EXTRACTION Right   . EYE SURGERY Left 1970s   for eye injury  . HERNIA REPAIR    . INSERTION PROSTATE RADIATION SEED    . UMBILICAL HERNIA REPAIR N/A 03/06/2013    Procedure: HERNIA REPAIR UMBILICAL WITH MESH;  Surgeon: Edward Jolly, MD;  Location: [facility] OR;  Service: General;  Laterality: N/A;   Family History:   Family History  Problem Relation Age of Onset  . Cancer Mother   . Heart disease Mother   . Cancer Father    Social History:  reports that  has never smoked. he has never used smokeless tobacco. He reports that he does not drink alcohol or use drugs. Allergies  Allergen Reactions  . Cardura [Doxazosin Mesylate] Other (See Comments)    Hallucinations   Prior to Admission medications   Medication Sig Start Date End Date Taking? Authorizing Provider  allopurinol (ZYLOPRIM) 100 MG tablet Take 100 mg by mouth daily.     Yes [provider]  amLODipine (NORVASC) 5 MG tablet Take 5 mg by mouth at bedtime. 01/04/17  Yes [provider]  amoxicillin-clavulanate (AUGMENTIN) 500-125 MG tablet Take 1 tablet by mouth every 12 (twelve) hours. 10 day course started 04/09/17 04/09/17  Yes [provider]  atorvastatin (LIPITOR) 40 MG tablet Take 40 mg by mouth daily.     Yes [provider]  Cholecalciferol (VITAMIN D-3) 5000 units TABS Take 10,000 Units by mouth daily.   Yes [provider]  gabapentin (NEURONTIN) 100 MG capsule  Take 100 mg by mouth at bedtime. 12/27/16  Yes [provider]  lisinopril (PRINIVIL,ZESTRIL) 10 MG tablet Take 10 mg by mouth at bedtime. 03/25/17  Yes [provider]  metoprolol succinate (TOPROL-XL) 100 MG 24 hr tablet Take 100 mg by mouth at bedtime. 02/24/17  Yes [provider]  multivitamin (RENA-VIT) TABS tablet Take 1 tablet by mouth daily.   Yes [provider]  promethazine-dextromethorphan (PROMETHAZINE-DM) 6.25-15 MG/5ML syrup Take 5 mLs by mouth every 6 (six) hours as needed for cough.   Yes [provider]  sevelamer carbonate (RENVELA) 800 MG tablet Take 1,600-2,400 mg by mouth See admin instructions. Take 3 tablets (2400 mg)  by mouth 3 times daily with meals and 2 tablets (1600 mg)  with snacks   Yes [provider]  acetaminophen (TYLENOL) 500 MG tablet Take 1,000 mg by mouth daily as needed for headache.    [provider]  diphenoxylate-atropine (LOMOTIL) 2.5-0.025 MG per tablet TAKE 1 TABLET BY MOUTH EVERY 4 TO 6 HOURS AS NEEDED Patient taking differently: TAKE 1 TABLET BY MOUTH EVERY 4 TO 6 HOURS AS NEEDED loose stool 10/09/14   Zehr, Janett Billow D, PA-C  ondansetron (ZOFRAN ODT) 8 MG disintegrating tablet Take 1 tablet (8 mg total) by mouth every 8 (eight) hours as needed for nausea or vomiting. Patient not taking: Reported on 04/17/2017 03/01/17   Jola Schmidt, MD  SENSIPAR 30 MG tablet Take 30 mg by mouth every Monday, Wednesday, and Friday with hemodialysis.  12/12/14   [provider]   No current facility-administered medications for this encounter.    Current Outpatient Medications  Medication Sig Dispense Refill  . allopurinol (ZYLOPRIM) 100 MG tablet Take 100 mg by mouth daily.      Marland Kitchen amLODipine (NORVASC) 5 MG tablet Take 5 mg by mouth at bedtime.  9  . amoxicillin-clavulanate (AUGMENTIN) 500-125 MG tablet Take 1 tablet by mouth every 12 (twelve) hours. 10 day course started 04/09/17  0  . atorvastatin (LIPITOR) 40 MG tablet Take 40 mg by mouth daily.      . Cholecalciferol (VITAMIN D-3) 5000 units TABS Take 10,000 Units by mouth daily.    Marland Kitchen gabapentin (NEURONTIN) 100 MG capsule Take 100 mg by mouth at bedtime.  10  . lisinopril (PRINIVIL,ZESTRIL) 10 MG tablet Take 10 mg by mouth at bedtime.  3  . metoprolol succinate (TOPROL-XL) 100 MG 24 hr tablet Take 100 mg by mouth at bedtime.  3  . multivitamin (RENA-VIT) TABS tablet Take 1 tablet by mouth daily.    . promethazine-dextromethorphan (PROMETHAZINE-DM) 6.25-15 MG/5ML syrup Take 5 mLs by mouth every 6 (six) hours as needed for cough.    . sevelamer carbonate (RENVELA) 800 MG tablet Take 1,600-2,400 mg by mouth See admin  instructions. Take 3 tablets (2400 mg) by mouth 3 times daily with meals and 2 tablets (1600 mg)  with snacks    . acetaminophen (TYLENOL) 500 MG tablet Take 1,000 mg by mouth daily as needed for headache.    . diphenoxylate-atropine (LOMOTIL) 2.5-0.025 MG per tablet TAKE 1 TABLET BY MOUTH EVERY 4 TO 6 HOURS AS NEEDED (Patient taking differently: TAKE 1 TABLET BY MOUTH EVERY 4 TO 6 HOURS AS NEEDED loose stool) 30 tablet 0  . ondansetron (ZOFRAN ODT) 8 MG disintegrating tablet Take 1 tablet (8 mg total) by mouth every 8 (eight) hours as needed for nausea or vomiting. (Patient not taking: Reported on 04/17/2017) 10 tablet 0  . SENSIPAR 30 MG  tablet Take 30 mg by mouth every Monday, Wednesday, and Friday with hemodialysis.      Labs: Basic Metabolic Panel: Recent Labs  Lab 04/17/17 1800  NA 141  K 5.8*  CL 97*  CO2 20*  GLUCOSE 101*  BUN 74*  CREATININE 15.52*  CALCIUM 9.4   Liver Function Tests: Recent Labs  Lab 04/17/17 1800  AST 286*  ALT 233*  ALKPHOS 72  BILITOT 1.0  PROT 7.0  ALBUMIN 3.5   No results for input(s): LIPASE, AMYLASE in the last 168 hours. No results for input(s): AMMONIA in the last 168 hours. CBC: Recent Labs  Lab 04/17/17 1800  WBC 10.8*  NEUTROABS 8.0*  HGB 9.0*  HCT 29.0*  MCV 96.3  PLT 349   Cardiac Enzymes: Recent Labs  Lab 04/17/17 1800  TROPONINI 0.22*   CBG: No results for input(s): GLUCAP in the last 168 hours. Iron Studies: No results for input(s): IRON, TIBC, TRANSFERRIN, FERRITIN in the last 72 hours. Studies/Results: Dg Chest Port 1 View  Result Date: 04/17/2017 CLINICAL DATA:  Dyspnea EXAM: PORTABLE CHEST 1 VIEW COMPARISON:  04/09/2017 FINDINGS: Stable cardiomegaly with aortic atherosclerosis. Interval development of mild interstitial edema. Pleural effusions however not apparent on current exam. No pneumonic consolidation. No acute osseous abnormality. IMPRESSION: 1. Stable cardiomegaly and aortic atherosclerosis. 2. Interval  development of mild interstitial edema. Electronically Signed   By: Ashley Royalty M.D.   On: 04/17/2017 18:23    ROS: Pertinent items noted in HPI and remainder of comprehensive ROS otherwise negative. Physical Exam: Vitals:   04/17/17 1752 04/17/17 1815 04/17/17 1830 04/17/17 1845  BP:  135/69 (!) 146/76 (!) 118/49  Pulse: (!) 40 (!) 37 (!) 38 (!) 37  Resp: 19 (!) 22 (!) 23 (!) 24  Temp: (!) 97.3 F (36.3 C)     TempSrc: Oral     SpO2: 90% 96% 98% 92%  Weight:      Height:          Weight change:  No intake or output data in the 24 hours ending 04/17/17 1958 BP (!) 118/49   Pulse (!) 37   Temp (!) 97.3 F (36.3 C) (Oral)   Resp (!) 24   Ht 5\' 8"  (1.727 m)   Wt 108.9 kg (240 lb)   SpO2 92%   BMI 36.49 kg/m  General appearance: fatigued, mild distress and mildly obese Head: Normocephalic, without obvious abnormality, atraumatic Resp: rales bilaterally Cardio: bradycardic at 48 no rub GI: soft, non-tender; bowel sounds normal; no masses,  no organomegaly Extremities: edema 1+ lower extremity edema and LAVF +T/B, tortuous Dialysis Access:  Dialysis Orders: Center: [place]  on MWF . EDW 94.5kg HD Bath 2K/  Time 4 hrs Heparin std. Access LAFVF BFR 400 DFR 800      Assessment/Plan: 1.  bradycardia- new heart block work up per cardiology.  Will hold toprol  2. Hyperkalemia, mild- plan for HD and treat with calcium/insulin/D50 until we can provide urgent HD 3.  ESRD -  MWF off schedule, plan for urgent HD today 4.  Hypertension/volume  -  Volume overloaded will UF as tolerated 5.  Anemia  - will resume esa 6.  Metabolic bone disease -   On binders, sensipar, and vitamin D, will need outpatient regimen 7.  Nutrition -  Renal diet 8. Pneumonia- was on abx but doesn't know what it was.  Maybe keflex as he knows the dose was 500mg   Donetta Potts, MD [facility] Pager [phone] 04/17/2017, 7:58 PM

## 2017-04-17 NOTE — ED Notes (Signed)
Pt placed on pads 

## 2017-04-17 NOTE — ED Notes (Signed)
Pt verbalized understanding discharge instructions and denies any further needs or questions at this time. VS stable, ambulatory and steady gait.   

## 2017-04-17 NOTE — Consult Note (Addendum)
CARDIOLOGY CONSULT NOTE   Referring Physician: Dr. Lita Mains Primary Physician: Dr. Seward Carol Primary Cardiologist: None Reason for Consultation: 3rd degree heart block  HPI:  Patient is a 84 y/o AAM with hx of ESRD (M,W,F), HTN, HLP, Type 2 DM comes to the hospital today after having cough and minimal productive sputum all week with some associated diarrhea. He claims to have been exposed to some sick contacts particularly at the dialysis facility on Monday, which is when he started feeling ill. He missed his future dialysis sessions due the fact that he felt so sick with diffuse myalgias. He denies any chest pain, but does endorse shortness of breath. He also denies any dizziness with ambulation, pre-syncope or syncope. He denies any orthopnea, but did endorse some lower extremity swelling.  It appears that he was tx for PNA by his PCP with augmentin, but symptoms continued to persist. Given his symptoms above, he was brought to the ER by his daughter who lives with him.  Review of Systems:     Cardiac Review of Systems: {Y] = yes [ ]  = no  Chest Pain [    ]  Resting SOB [ x  ] Exertional SOB  [ x ]  Orthopnea [  ]   Pedal Edema [   ]    Palpitations [  ] Syncope  [  ]   Presyncope [   ]  General Review of Systems: [Y] = yes [  ]=no Constitional: recent weight change [  ]; anorexia [  ]; fatigue [ x ]; nausea [ x ]; night sweats [  ]; fever [  ]; or chills [  ];                                                                     Eyes : blurred vision [  ]; diplopia [   ]; vision changes [  ];  Amaurosis fugax[  ]; Resp: cough [ x ];  wheezing[ x ];  hemoptysis[  ];  PND [  ];  GI:  gallstones[  ], vomiting[  ];  dysphagia[  ]; melena[  ];  hematochezia [  ]; heartburn[  ];   GU: kidney stones [  ]; hematuria[  ];   dysuria [  ];  nocturia[  ]; incontinence [  ];             Skin: rash, swelling[  ];, hair loss[  ];  peripheral edema[  ];  or itching[  ]; Musculosketetal: myalgias[  x ];  joint swelling[  ];  joint erythema[  ];  joint pain[  ];  back pain[  ];  Heme/Lymph: bruising[  ];  bleeding[  ];  anemia[  ];  Neuro: TIA[  ];  headaches[  ];  stroke[  ];  vertigo[  ];  seizures[  ];   paresthesias[  ];  difficulty walking[  ];  Psych:depression[  ]; anxiety[  ];  Endocrine: diabetes[  ];  thyroid dysfunction[  ];  Other:  Past Medical History:  Diagnosis Date  . Anemia   . Arthritis    "knees" (04/11/2016)  . Blind left eye    S/P trauma  . Chronic total occlusion of artery of  the extremities ([place])    pt not aware of this  . Claudication ([place])   . ESRD on dialysis [facility])    "MWF; [name]" ((04/10/2016)  . ESRD on peritoneal dialysis [facility])    Started dialysis around April 2015 per son.  Has been doing peritoneal dialysis at home.     . Gout   . Hyperlipidemia   . Hypertension   . Overweight(278.02)   . Prostate cancer ([place]) 1990s  . Shingles   . Stroke [facility]) ~1998   denies residual on 04/11/2016  . Type II diabetes mellitus ([place])     (Not in a hospital admission)  Infusions: None  Allergies  Allergen Reactions  . Cardura [Doxazosin Mesylate] Other (See Comments)    Hallucinations   Social History   Socioeconomic History  . Marital status: Widowed    Spouse name: Not on file  . Number of children: Not on file  . Years of education: Not on file  . Highest education level: Not on file  Social Needs  . Financial resource strain: Not on file  . Food insecurity - worry: Not on file  . Food insecurity - inability: Not on file  . Transportation needs - medical: Not on file  . Transportation needs - non-medical: Not on file  Occupational History  . Not on file  Tobacco Use  . Smoking status: Never Smoker  . Smokeless tobacco: Never Used  Substance and Sexual Activity  . Alcohol use: No  . Drug use: No  . Sexual activity: No  Other Topics Concern  . Not on file  Social History Narrative  . Not on file   Family History  Problem  Relation Age of Onset  . Cancer Mother   . Heart disease Mother   . Cancer Father    PHYSICAL EXAM: Vitals:   04/17/17 1845 04/17/17 2015  BP: (!) 118/49 131/72  Pulse: (!) 37 (!) 34  Resp: (!) 24 (!) 23  Temp:    SpO2: 92% 100%    Intake/Output Summary (Last 24 hours) at 04/17/2017 2154 Last data filed at 04/17/2017 1958 Gross per 24 hour  Intake 110 ml  Output -  Net 110 ml    General:  Well appearing. Mild respiratory difficulty HEENT: normal Neck: supple. JVD present.  Cor: PMI nondisplaced. Regular rate & rhythm. No rubs, gallops or murmurs.  Lungs: mild expiratory wheezing Abdomen: soft, nontender, nondistended. No hepatosplenomegaly. No bruits or masses. Good bowel sounds. Extremities: no cyanosis, clubbing, rash, edema. L arm fistula Neuro: alert & oriented x 3, cranial nerves grossly intact. moves all 4 extremities w/o difficulty. Affect pleasant.  ECG:  Results for orders placed or performed during the hospital encounter of 04/17/17 (from the past 24 hour(s))  CBC with Differential/Platelet     Status: Abnormal   Collection Time: 04/17/17  6:00 PM  Result Value Ref Range   WBC 10.8 (H) 4.0 - 10.5 K/uL   RBC 3.01 (L) 4.22 - 5.81 MIL/uL   Hemoglobin 9.0 (L) 13.0 - 17.0 g/dL   HCT 29.0 (L) 39.0 - 52.0 %   MCV 96.3 78.0 - 100.0 fL   MCH 29.9 26.0 - 34.0 pg   MCHC 31.0 30.0 - 36.0 g/dL   RDW 17.2 (H) 11.5 - 15.5 %   Platelets 349 150 - 400 K/uL   Neutrophils Relative % 73 %   Neutro Abs 8.0 (H) 1.7 - 7.7 K/uL   Lymphocytes Relative 19 %   Lymphs Abs 2.1  0.7 - 4.0 K/uL   Monocytes Relative 6 %   Monocytes Absolute 0.6 0.1 - 1.0 K/uL   Eosinophils Relative 1 %   Eosinophils Absolute 0.1 0.0 - 0.7 K/uL   Basophils Relative 1 %   Basophils Absolute 0.1 0.0 - 0.1 K/uL  Comprehensive metabolic panel     Status: Abnormal   Collection Time: 04/17/17  6:00 PM  Result Value Ref Range   Sodium 141 135 - 145 mmol/L   Potassium 5.8 (H) 3.5 - 5.1 mmol/L   Chloride  97 (L) 101 - 111 mmol/L   CO2 20 (L) 22 - 32 mmol/L   Glucose, Bld 101 (H) 65 - 99 mg/dL   BUN 74 (H) 6 - 20 mg/dL   Creatinine, Ser 15.52 (H) 0.61 - 1.24 mg/dL   Calcium 9.4 8.9 - 10.3 mg/dL   Total Protein 7.0 6.5 - 8.1 g/dL   Albumin 3.5 3.5 - 5.0 g/dL   AST 286 (H) 15 - 41 U/L   ALT 233 (H) 17 - 63 U/L   Alkaline Phosphatase 72 38 - 126 U/L   Total Bilirubin 1.0 0.3 - 1.2 mg/dL   GFR calc non Af Amer 2 (L) >60 mL/min   GFR calc Af Amer 3 (L) >60 mL/min   Anion gap 24 (H) 5 - 15  Brain natriuretic peptide     Status: Abnormal   Collection Time: 04/17/17  6:00 PM  Result Value Ref Range   B Natriuretic Peptide 2,812.2 (H) 0.0 - 100.0 pg/mL  Troponin I     Status: Abnormal   Collection Time: 04/17/17  6:00 PM  Result Value Ref Range   Troponin I 0.22 (HH) <0.03 ng/mL  Magnesium     Status: Abnormal   Collection Time: 04/17/17  6:00 PM  Result Value Ref Range   Magnesium 3.0 (H) 1.7 - 2.4 mg/dL   Dg Chest Port 1 View  Result Date: 04/17/2017 CLINICAL DATA:  Dyspnea EXAM: PORTABLE CHEST 1 VIEW COMPARISON:  04/09/2017 FINDINGS: Stable cardiomegaly with aortic atherosclerosis. Interval development of mild interstitial edema. Pleural effusions however not apparent on current exam. No pneumonic consolidation. No acute osseous abnormality. IMPRESSION: 1. Stable cardiomegaly and aortic atherosclerosis. 2. Interval development of mild interstitial edema. Electronically Signed   By: Ashley Royalty M.D.   On: 04/17/2017 18:23   ASSESSMENT:  Third degree heart block, likely to be reversible ESRD dialysis M,W,F, non-compliant Hypoxic resp failure 2/2 to the above Anion gap acidosis HTN,HLP, Type 2 DM Hx of CVA Infrarenal saccular aneurysm  PLAN/DISCUSSION:  Patient's atrial rate is around 75 with a ventricular rate around 39-40's. The escape rhythm is junctional with a narrow complex. While in the room HR's improved into the 80-90's while still being junctional.  Will r/o reversible  causes and would recommend holding on the toprol XL. Patient takes a 100mg , Qday at home and last dose was yesterday afternoon, unclear as to the time. The half life of the medication can be around 35hrs. Would recommend checking TSH and continue to trend troponin's. If second one remains flat it is likely in the setting of ESRD/Pulmonary edema. Would also consider checking pH prior to dialysis if possible as acidosis given the non-compliance with dialysis can also contribute.  Currently asymptomatic with good hr response, but if patient does decompensate further (becomes wide complex or refractory HT) or becomes symptomatic would recommend administration of Dopamine while setting up for transcutaneous pacing. However, do not anticipate this to be the case  as patient's EKG from 01/2017 showed NSR with no conduction abnormalities.  Tx the mild hyperkalemia. To be addressed by nephrology, less likely to be contributing as K is only 5.8. Plan is to perform dialysis today as well.  Patient asymptomatic in regards to chest pain making ACS less likely to be an etiology. Certainly no ST changes concerning for acute acs at this time. As noted above, cont to trend trops and hold on heparin for now unless troponin's increase significantly.  Likely has an underlying viral infection given his symptoms.  If heart block doesn't resolve patient is open to a device.  Willeen Cass Cardiology fellow

## 2017-04-17 NOTE — ED Notes (Signed)
PAGED ADMITTING TO [facility]

## 2017-04-17 NOTE — ED Notes (Signed)
MD made aware of positive troponin and BNP

## 2017-04-17 NOTE — H&P (Signed)
History and Physical    Sean Best:[id number] DOB: 1933-06-10 DOA: 04/17/2017  PCP: Seward Carol, MD   Patient coming from: home    Chief Complaint: Shortness of breath  HPI: Sean Best is a 84 y.o. male with medical history significant of end-stage renal disease on Monday Wednesday Friday dialysis, hyperlipidemia, hypertension, aortic aneurysm, history of stroke, type 2 diabetes who comes in with shortness of breath.  Patient reports that last Friday he began to have cough, change in sputum production, and significant myalgias and chills.  He was seen by his PCP approximately 1 week ago and diagnosed with a possible pneumonia and given Augmentin.  He reports taking it religiously.  He reports however he continued to have profound fatigue, weakness and hence was unable to make his dialysis sessions on Wednesday and Friday.  He reports that his sputum cleared up and he just has small amounts of clear white sputum now.  He began to develop progressive shortness of breath, lower extremity edema, orthopnea, paroxysmal nocturnal dyspnea.  This is particular been exacerbated for the past 2 days and he has been having significantly worse dyspnea on exertion.  He denies any chest pain, abdominal pain, nausea vomiting, diarrhea, rash.  He does endorse significant weakness and fatigue.  ED Course: In the ED his vitals were notable for heart rate in the 30s-40s.  EKG showed very prolonged PR interval concerning for possible third-degree heart block.  Labs were notable for potassium of 5.8.  And patient was noted to have elevated AST and ALT in the 200s.  CBC showed a white count of 10.8, hemoglobin of 9.0, BNP of greater than 2000 and elevated troponin of 0.22.  Chest x-ray showed pulmonary edema.  Review of Systems: As per HPI otherwise 10 point review of systems negative.     Past Medical History:  Diagnosis Date  . Anemia   . Arthritis    "knees" (04/11/2016)  . Blind left eye    S/P  trauma  . Chronic total occlusion of artery of the extremities (HCC)    pt not aware of this  . Claudication ([place])   . ESRD on dialysis [facility])    "MWF; [name]" ((04/10/2016)  . ESRD on peritoneal dialysis [facility])    Started dialysis around April 2015 per son.  Has been doing peritoneal dialysis at home.     . Gout   . Hyperlipidemia   . Hypertension   . Overweight(278.02)   . Prostate cancer ([place]) 1990s  . Shingles   . Stroke [facility]) ~1998   denies residual on 04/11/2016  . Type II diabetes mellitus ([place])     Past Surgical History:  Procedure Laterality Date  . AV FISTULA PLACEMENT, RADIOCEPHALIC  81/19/1478   Left arm  . CAPD INSERTION N/A 03/06/2013   Procedure: LAPAROSCOPIC INSERTION CONTINUOUS AMBULATORY PERITONEAL DIALYSIS  (CAPD) CATHETER;  Surgeon: Edward Jolly, MD;  Location: [place];  Service: General;  Laterality: N/A;  . CATARACT EXTRACTION Right   . EYE SURGERY Left 1970s   for eye injury  . HERNIA REPAIR    . INSERTION PROSTATE RADIATION SEED    . UMBILICAL HERNIA REPAIR N/A 03/06/2013   Procedure: HERNIA REPAIR UMBILICAL WITH MESH;  Surgeon: Edward Jolly, MD;  Location: [place];  Service: General;  Laterality: N/A;     reports that  has never smoked. he has never used smokeless tobacco. He reports that he does not drink alcohol or use drugs.  Allergies  Allergen Reactions  . Cardura [Doxazosin Mesylate] Other (See Comments)    Hallucinations    Family History  Problem Relation Age of Onset  . Cancer Mother   . Heart disease Mother   . Cancer Father    Prior to Admission medications   Medication Sig Start Date End Date Taking? Authorizing Provider  acetaminophen (TYLENOL) 500 MG tablet Take 1,000 mg by mouth daily as needed for headache.    [provider]  allopurinol (ZYLOPRIM) 100 MG tablet Take 100 mg by mouth daily.      [provider]  amLODipine (NORVASC) 5 MG tablet Take 5 mg by mouth at bedtime. 01/04/17   [provider]  aspirin EC 325 MG tablet Take 325 mg by mouth daily.    [provider]  atorvastatin (LIPITOR) 40 MG tablet Take 40 mg by mouth daily.      [provider]  calcitRIOL (ROCALTROL) 0.25 MCG capsule Take 0.25 mcg by mouth. Jory Sims, and Friday 03/13/14   [provider]  diphenoxylate-atropine (LOMOTIL) 2.5-0.025 MG per tablet TAKE 1 TABLET BY MOUTH EVERY 4 TO 6 HOURS AS NEEDED Patient taking differently: TAKE 1 TABLET BY MOUTH EVERY 4 TO 6 HOURS AS NEEDED loose stool 10/09/14   Zehr, Janett Billow D, PA-C  gabapentin (NEURONTIN) 100 MG capsule Take 100 mg by mouth at bedtime. 12/27/16   [provider]  metoprolol succinate (TOPROL-XL) 100 MG 24 hr tablet Take 100 mg by mouth at bedtime. 02/24/17   [provider]  multivitamin (RENA-VIT) TABS tablet Take 1 tablet by mouth daily.    [provider]  ondansetron (ZOFRAN ODT) 8 MG disintegrating tablet Take 1 tablet (8 mg total) by mouth every 8 (eight) hours as needed for nausea or vomiting. 03/01/17   Jola Schmidt, MD  SENSIPAR 30 MG tablet Take 30 mg by mouth daily. 12/12/14   [provider]  sevelamer carbonate (RENVELA) 800 MG tablet Take 1,600-2,400 mg by mouth See admin instructions. Take 3 tablets (2400 mg) by mouth 3 times daily with meals and 2 tablets (1600 mg) twice daily with snacks    [provider]    Physical Exam: Vitals:   04/17/17 1752 04/17/17 1815 04/17/17 1830 04/17/17 1845  BP:  135/69 (!) 146/76 (!) 118/49  Pulse: (!) 40 (!) 37 (!) 38 (!) 37  Resp: 19 (!) 22 (!) 23 (!) 24  Temp: (!) 97.3 F (36.3 C)     TempSrc: Oral     SpO2: 90% 96% 98% 92%  Weight:      Height:        Constitutional: NAD, calm, comfortable Vitals:   04/17/17 1752 04/17/17 1815 04/17/17 1830 04/17/17 1845  BP:  135/69 (!) 146/76 (!) 118/49  Pulse: (!) 40 (!) 37 (!) 38 (!) 37  Resp: 19 (!) 22 (!) 23 (!) 24  Temp: (!) 97.3 F (36.3 C)     TempSrc: Oral       SpO2: 90% 96% 98% 92%  Weight:      Height:       Eyes: Anicteric sclera ENMT: Moist mucous membranes, poor dentition, nasal cannula in place Neck: normal, supple Respiratory: Crackles in bilateral lungs up to mid lung field, mildly increased work of breathing, no rhonchi, scattered wheezes in anterior lung fields Cardiovascular: Bradycardia, regular rhythm, 2 out of 6 murmur over precordium Abdomen: no tenderness, no masses palpated. No hepatosplenomegaly. Bowel sounds positive.  Musculoskeletal: Left forearm fistula with bruit and  thrill Skin: no rashes on visible skin Neurologic: Grossly intact, moving all extremities.  Psychiatric: Normal judgment and insight. Alert and oriented x 3. Normal mood.    Labs on Admission: I have personally reviewed following labs and imaging studies  CBC: Recent Labs  Lab 04/17/17 1800  WBC 10.8*  NEUTROABS 8.0*  HGB 9.0*  HCT 29.0*  MCV 96.3  PLT 932   Basic Metabolic Panel: Recent Labs  Lab 04/17/17 1800  NA 141  K 5.8*  CL 97*  CO2 20*  GLUCOSE 101*  BUN 74*  CREATININE 15.52*  CALCIUM 9.4  MG 3.0*   GFR: Estimated Creatinine Clearance: 4.5 mL/min (A) (by C-G formula based on SCr of 15.52 mg/dL (H)). Liver Function Tests: Recent Labs  Lab 04/17/17 1800  AST 286*  ALT 233*  ALKPHOS 72  BILITOT 1.0  PROT 7.0  ALBUMIN 3.5   No results for input(s): LIPASE, AMYLASE in the last 168 hours. No results for input(s): AMMONIA in the last 168 hours. Coagulation Profile: No results for input(s): INR, PROTIME in the last 168 hours. Cardiac Enzymes: Recent Labs  Lab 04/17/17 1800  TROPONINI 0.22*   BNP (last 3 results) No results for input(s): PROBNP in the last 8760 hours. HbA1C: No results for input(s): HGBA1C in the last 72 hours. CBG: No results for input(s): GLUCAP in the last 168 hours. Lipid Profile: No results for input(s): CHOL, HDL, LDLCALC, TRIG, CHOLHDL, LDLDIRECT in the last 72 hours. Thyroid Function  Tests: No results for input(s): TSH, T4TOTAL, FREET4, T3FREE, THYROIDAB in the last 72 hours. Anemia Panel: No results for input(s): VITAMINB12, FOLATE, FERRITIN, TIBC, IRON, RETICCTPCT in the last 72 hours. Urine analysis:    Component Value Date/Time   COLORURINE YELLOW 12/29/2014 0550   APPEARANCEUR CLOUDY (A) 12/29/2014 0550   LABSPEC 1.020 12/29/2014 0550   PHURINE 5.0 12/29/2014 0550   GLUCOSEU NEGATIVE 12/29/2014 0550   HGBUR SMALL (A) 12/29/2014 0550   BILIRUBINUR NEGATIVE 12/29/2014 0550   KETONESUR NEGATIVE 12/29/2014 0550   PROTEINUR 100 (A) 12/29/2014 0550   UROBILINOGEN 0.2 12/29/2014 0550   NITRITE NEGATIVE 12/29/2014 0550   LEUKOCYTESUR SMALL (A) 12/29/2014 0550    Radiological Exams on Admission: Dg Chest Port 1 View  Result Date: 04/17/2017 CLINICAL DATA:  Dyspnea EXAM: PORTABLE CHEST 1 VIEW COMPARISON:  04/09/2017 FINDINGS: Stable cardiomegaly with aortic atherosclerosis. Interval development of mild interstitial edema. Pleural effusions however not apparent on current exam. No pneumonic consolidation. No acute osseous abnormality. IMPRESSION: 1. Stable cardiomegaly and aortic atherosclerosis. 2. Interval development of mild interstitial edema. Electronically Signed   By: Ashley Royalty M.D.   On: 04/17/2017 18:23    EKG: Independently reviewed.  Third-degree heart block  Assessment/Plan Active Problems:   Anemia of chronic disease   DM (diabetes mellitus), type 2 with renal complications, diet controlled   Benign hypertension   Hyperlipidemia   H/O: stroke   ESRD on hemodialysis (HCC)   Heart block   Fluid overload   Elevated troponin    ##) Fluid overload in the setting of missing dialysis: Suspect that most of patient's symptoms are related to missing dialysis at this time.  He appears to be grossly fluid overloaded but with orthopnea, paroxysmal nocturnal dyspnea, lower examinee edema. -Nephrology consult for dialysis -Restrict fluids -Weight  daily  ##) Pneumonia: This appears to be adequately treated and chest x-ray shows no current evidence of pneumonia.  His symptoms sound more consistent with possible influenza.  His white count is  slightly elevated however he has no other signs of infection anywhere. -Check flu PCR  ##) Third-degree heart block: Unclear etiology could be related to fluid overload and possible mild hyperkalemia however more concerning is the elevated troponin.  This could very well just be from not getting dialyzed however if he did have small thrombus to his artery supplying the AV node this could be concerning.  He is fortunately stable right now with good blood pressures and asymptomatic. -Treat mild hyperkalemia -Cardiology consult -Stepdown bed with telemetry  -Echo ordered  ##) Elevated troponin: Per above patient denies any chest pain.  Patient does have EKG changes with new onset third-degree heart block. -Cardio GI consult -Trend troponins -We will consider starting heparin if troponin continues to up trend  ##) Elevated LFTs: Possibly related to congestive hepatopathy and missing several days of dialysis. -Dialyzed -Repeat in the morning -Right upper quadrant ultrasound ordered  ##) End-stage renal disease on Monday Wednesday Friday dialysis: -nephrology consult -Continue vitamin D3 supplementation - Continue Sensipar 30 mg every Monday Wednesday Friday -Continue sevelameravella Mier 800 mgS  ##) Hypoproliferative anemia: - Stable  ##) Hypertension: -Hold metoprolol in setting of bradycardia -Hold lisinopril in setting of hyperkalemia - Hold amlodipine 5 mg  ##) Hyperlipidemia -Continue statin  ##) Chronic pain: -Continue gabapentin 100 mg nightly  Fluids: Restrict Electrolytes:: Manage r with dialysis Nutrition: Renal diet with fluid restriction  Prophylaxis: Subcu heparin  Disposition: Pending evaluation and treatment bradycardia and fluid overload  Full code   Cristy Folks MD [facility]   If 7PM-7AM, please contact night-coverage [email or web address] Password [facility]  04/17/2017, 7:38 PM

## 2017-04-17 NOTE — ED Provider Notes (Signed)
[place] EMERGENCY DEPARTMENT Provider Note   CSN: [id number] Arrival date & time: 04/17/17  1744     History   Chief Complaint Chief Complaint  Patient presents with  . Shortness of Breath    HPI Sean Best is a 84 y.o. male.  HPI Patient was diagnosed 1 week ago with shortness of breath.  States initially had yellow sputum production which is now white.  States he has ongoing shortness of breath.  Denies any chest pain.  Last dialyzed Monday.  States he has not felt well enough to go to dialysis.  No fever or chills.  Noted to have heart block during transport by EMS. Past Medical History:  Diagnosis Date  . Anemia   . Arthritis    "knees" (04/11/2016)  . Blind left eye    S/P trauma  . Chronic total occlusion of artery of the extremities (HCC)    pt not aware of this  . Claudication ([place])   . ESRD on dialysis [facility])    "MWF; [name]" ((04/10/2016)  . ESRD on peritoneal dialysis [facility])    Started dialysis around April 2015 per son.  Has been doing peritoneal dialysis at home.     . Gout   . Hyperlipidemia   . Hypertension   . Overweight(278.02)   . Prostate cancer ([place]) 1990s  . Shingles   . Stroke [facility]) ~1998   denies residual on 04/11/2016  . Type II diabetes mellitus [facility])     Patient Active Problem List   Diagnosis Date Noted  . ESRD on hemodialysis ([place]) 04/12/2016  . H/O: stroke 04/11/2016  . Saccular aneurysm of infrarenal aorta with thrombosed dissection of abdominal aorta 04/11/2016  . Obesity (BMI 30-39.9) 04/11/2016  . Syncope 04/11/2016  . Prostate CA ([place]) 12/28/2014  . Chest pain 12/28/2014  . Anemia of chronic disease 03/11/2013  . DM (diabetes mellitus), type 2 with renal complications, diet controlled 03/11/2013  . Benign hypertension 03/11/2013  . Hyperlipidemia 03/11/2013  . Umbilical hernia 81/44/8185    Past Surgical History:  Procedure Laterality Date  . AV FISTULA PLACEMENT, RADIOCEPHALIC  63/14/9702   Left arm  . CAPD INSERTION N/A 03/06/2013   Procedure: LAPAROSCOPIC INSERTION CONTINUOUS AMBULATORY PERITONEAL DIALYSIS  (CAPD) CATHETER;  Surgeon: Edward Jolly, MD;  Location: [place];  Service: General;  Laterality: N/A;  . CATARACT EXTRACTION Right   . EYE SURGERY Left 1970s   for eye injury  . HERNIA REPAIR    . INSERTION PROSTATE RADIATION SEED    . UMBILICAL HERNIA REPAIR N/A 03/06/2013   Procedure: HERNIA REPAIR UMBILICAL WITH MESH;  Surgeon: Edward Jolly, MD;  [facility];  Service: General;  Laterality: N/A;       Home Medications    Prior to Admission medications   Medication Sig Start Date End Date Taking? Authorizing Provider  acetaminophen (TYLENOL) 500 MG tablet Take 1,000 mg by mouth daily as needed for headache.    [provider]  allopurinol (ZYLOPRIM) 100 MG tablet Take 100 mg by mouth daily.      [provider]  amLODipine (NORVASC) 5 MG tablet Take 5 mg by mouth at bedtime. 01/04/17   [provider]  aspirin EC 325 MG tablet Take 325 mg by mouth daily.    [provider]  atorvastatin (LIPITOR) 40 MG tablet Take 40 mg by mouth daily.      [provider]  calcitRIOL (ROCALTROL) 0.25 MCG capsule Take 0.25 mcg  by mouth. Jory Sims, and Friday 03/13/14   [provider]  diphenoxylate-atropine (LOMOTIL) 2.5-0.025 MG per tablet TAKE 1 TABLET BY MOUTH EVERY 4 TO 6 HOURS AS NEEDED Patient taking differently: TAKE 1 TABLET BY MOUTH EVERY 4 TO 6 HOURS AS NEEDED loose stool 10/09/14   Zehr, Janett Billow D, PA-C  gabapentin (NEURONTIN) 100 MG capsule Take 100 mg by mouth at bedtime. 12/27/16   [provider]  metoprolol succinate (TOPROL-XL) 100 MG 24 hr tablet Take 100 mg by mouth at bedtime. 02/24/17   [provider]  multivitamin (RENA-VIT) TABS tablet Take 1 tablet by mouth daily.    [provider]  ondansetron (ZOFRAN ODT) 8 MG disintegrating tablet Take 1 tablet (8 mg total) by mouth  every 8 (eight) hours as needed for nausea or vomiting. 03/01/17   Jola Schmidt, MD  SENSIPAR 30 MG tablet Take 30 mg by mouth daily. 12/12/14   [provider]  sevelamer carbonate (RENVELA) 800 MG tablet Take 1,600-2,400 mg by mouth See admin instructions. Take 3 tablets (2400 mg) by mouth 3 times daily with meals and 2 tablets (1600 mg) twice daily with snacks    [provider]    Family History Family History  Problem Relation Age of Onset  . Cancer Mother   . Heart disease Mother   . Cancer Father     Social History Social History   Tobacco Use  . Smoking status: Never Smoker  . Smokeless tobacco: Never Used  Substance Use Topics  . Alcohol use: No  . Drug use: No     Allergies   Cardura [doxazosin mesylate]   Review of Systems Review of Systems  Constitutional: Negative for chills and fever.  HENT: Negative for congestion, sinus pressure and sore throat.   Eyes: Negative for visual disturbance.  Respiratory: Positive for cough and shortness of breath.   Cardiovascular: Positive for leg swelling. Negative for chest pain and palpitations.  Gastrointestinal: Negative for abdominal pain, constipation, diarrhea, nausea and vomiting.  Genitourinary: Negative for dysuria, flank pain and frequency.  Musculoskeletal: Negative for arthralgias, back pain, myalgias and neck pain.  Skin: Negative for rash and wound.  Neurological: Positive for weakness. Negative for dizziness, light-headedness, numbness and headaches.  All other systems reviewed and are negative.    Physical Exam Updated Vital Signs BP (!) 118/49   Pulse (!) 37   Temp (!) 97.3 F (36.3 C) (Oral)   Resp (!) 24   Ht 5\' 8"  (1.727 m)   Wt 108.9 kg (240 lb)   SpO2 92%   BMI 36.49 kg/m   Physical Exam  Constitutional: He is oriented to person, place, and time. He appears well-developed and well-nourished. No distress.  HENT:  Head: Normocephalic and atraumatic.  Mouth/Throat:  Oropharynx is clear and moist. No oropharyngeal exudate.  Eyes: EOM are normal. Pupils are equal, round, and reactive to light.  Neck: Normal range of motion. Neck supple.  Cardiovascular: Regular rhythm. Exam reveals no gallop and no friction rub.  No murmur heard. Bradycardia  Pulmonary/Chest: Effort normal. He has rales.  Diminished breath sounds bilateral bases.  Few scattered rales.  Abdominal: Soft. Bowel sounds are normal. There is no tenderness. There is no rebound and no guarding.  Musculoskeletal: Normal range of motion. He exhibits edema. He exhibits no tenderness.  2+ bilateral lower extremity pitting edema.  Left upper extremity AV fistula with palpable thrill  Lymphadenopathy:    He has no cervical adenopathy.  Neurological: He  is alert and oriented to person, place, and time.  Moves all extremities without deficit.  Sensation fully intact.  Skin: Skin is warm and dry. Capillary refill takes less than 2 seconds. No rash noted. He is not diaphoretic. No erythema.  Psychiatric: He has a normal mood and affect. His behavior is normal.  Nursing note and vitals reviewed.    ED Treatments / Results  Labs (all labs ordered are listed, but only abnormal results are displayed) Labs Reviewed  CBC WITH DIFFERENTIAL/PLATELET - Abnormal; Notable for the following components:      Result Value   WBC 10.8 (*)    RBC 3.01 (*)    Hemoglobin 9.0 (*)    HCT 29.0 (*)    RDW 17.2 (*)    Neutro Abs 8.0 (*)    All other components within normal limits  COMPREHENSIVE METABOLIC PANEL - Abnormal; Notable for the following components:   Potassium 5.8 (*)    Chloride 97 (*)    CO2 20 (*)    Glucose, Bld 101 (*)    BUN 74 (*)    Creatinine, Ser 15.52 (*)    AST 286 (*)    ALT 233 (*)    GFR calc non Af Amer 2 (*)    GFR calc Af Amer 3 (*)    Anion gap 24 (*)    All other components within normal limits  BRAIN NATRIURETIC PEPTIDE - Abnormal; Notable for the following components:   B  Natriuretic Peptide 2,812.2 (*)    All other components within normal limits  TROPONIN I - Abnormal; Notable for the following components:   Troponin I 0.22 (*)    All other components within normal limits  MAGNESIUM - Abnormal; Notable for the following components:   Magnesium 3.0 (*)    All other components within normal limits    EKG  EKG Interpretation  Date/Time:  Saturday April 17 2017 17:55:12 EST Ventricular Rate:  39 PR Interval:    QRS Duration: 100 QT Interval:  561 QTC Calculation: 452 R Axis:   18 Text Interpretation:  Complete (3-degree) AV block Prolonged PR interval Confirmed by Julianne Rice [id number]) on 04/17/2017 6:03:23 PM       Radiology Dg Chest Port 1 View  Result Date: 04/17/2017 CLINICAL DATA:  Dyspnea EXAM: PORTABLE CHEST 1 VIEW COMPARISON:  04/09/2017 FINDINGS: Stable cardiomegaly with aortic atherosclerosis. Interval development of mild interstitial edema. Pleural effusions however not apparent on current exam. No pneumonic consolidation. No acute osseous abnormality. IMPRESSION: 1. Stable cardiomegaly and aortic atherosclerosis. 2. Interval development of mild interstitial edema. Electronically Signed   By: Ashley Royalty M.D.   On: 04/17/2017 18:23    Procedures .Critical Care Performed by: Julianne Rice, MD Authorized by: Julianne Rice, MD   Critical care provider statement:    Critical care time (minutes):  45   Critical care start time:  04/17/2017 6:00 PM   Critical care end time:  04/17/2017 7:30 PM   Critical care time was exclusive of:  Separately billable procedures and treating other patients   Critical care was necessary to treat or prevent imminent or life-threatening deterioration of the following conditions:  Renal failure and cardiac failure   Critical care was time spent personally by me on the following activities:  Blood draw for specimens, development of treatment plan with patient or surrogate, discussions with  consultants, evaluation of patient's response to treatment, examination of patient, re-evaluation of patient's condition, pulse oximetry, ordering and review of  radiographic studies, ordering and review of laboratory studies and ordering and performing treatments and interventions   (including critical care time)  Medications Ordered in ED Medications  calcium gluconate 1 g in sodium chloride 0.9 % 100 mL IVPB (not administered)     Initial Impression / Assessment and Plan / ED Course  I have reviewed the triage vital signs and the nursing notes.  Pertinent labs & imaging results that were available during my care of the patient were reviewed by me and considered in my medical decision making (see chart for details).    EKG with evidence of complete heart block.  Maintains normal systolic blood pressure.  Chest x-ray with evidence of pulmonary edema.  Patient also has significantly elevated BNP.  His troponin is 0.22.  Pacer pads were placed in the ED. discussed with cardiology fellow who will consult on patient.  Also discussed with nephrology who will see patient per department to arrange for dialysis.  Recommend calcium be given.  Hospitalist to admit.   Final Clinical Impressions(s) / ED Diagnoses   Final diagnoses:  Complete heart block (HCC)  Acute pulmonary edema (HCC)  Elevated troponin    ED Discharge Orders    None       Julianne Rice, MD 04/17/17 1931

## 2017-04-17 NOTE — ED Triage Notes (Signed)
Per EMS: Pt has been c/o SOB x 1 week, getting worse today.  Pt was recently seen and treated for Pneumonia. Dialysis Pt and has missed his last 2 appts. Pt schedule is MWF. CBG 122.  Pt intinially was Sinus Loletha Grayer with 1st degree HB. Pt went in 2nd degree HB type 2 for a brief period. A&Ox4.

## 2017-04-18 ENCOUNTER — Inpatient Hospital Stay (HOSPITAL_COMMUNITY): Payer: Medicare Other

## 2017-04-18 DIAGNOSIS — D638 Anemia in other chronic diseases classified elsewhere: Secondary | ICD-10-CM

## 2017-04-18 DIAGNOSIS — Z992 Dependence on renal dialysis: Secondary | ICD-10-CM

## 2017-04-18 DIAGNOSIS — R0600 Dyspnea, unspecified: Secondary | ICD-10-CM

## 2017-04-18 DIAGNOSIS — N186 End stage renal disease: Secondary | ICD-10-CM

## 2017-04-18 DIAGNOSIS — I361 Nonrheumatic tricuspid (valve) insufficiency: Secondary | ICD-10-CM

## 2017-04-18 DIAGNOSIS — J9601 Acute respiratory failure with hypoxia: Secondary | ICD-10-CM

## 2017-04-18 DIAGNOSIS — R748 Abnormal levels of other serum enzymes: Secondary | ICD-10-CM

## 2017-04-18 DIAGNOSIS — I442 Atrioventricular block, complete: Principal | ICD-10-CM

## 2017-04-18 LAB — BLOOD GAS, ARTERIAL
Acid-base deficit: 7.3 mmol/L — ABNORMAL HIGH (ref 0.0–2.0)
Bicarbonate: 18.3 mmol/L — ABNORMAL LOW (ref 20.0–28.0)
Drawn by: 51133
FIO2: 100
O2 Saturation: 96.3 %
Patient temperature: 98.6
pCO2 arterial: 41.2 mmHg (ref 32.0–48.0)
pH, Arterial: 7.271 — ABNORMAL LOW (ref 7.350–7.450)
pO2, Arterial: 105 mmHg (ref 83.0–108.0)

## 2017-04-18 LAB — LACTIC ACID, PLASMA
Lactic Acid, Venous: 7.5 mmol/L (ref 0.5–1.9)
Lactic Acid, Venous: 1 mmol/L (ref 0.5–1.9)

## 2017-04-18 LAB — CBC
HCT: 27.7 % — ABNORMAL LOW (ref 39.0–52.0)
HCT: 25.8 % — ABNORMAL LOW (ref 39.0–52.0)
Hemoglobin: 8.5 g/dL — ABNORMAL LOW (ref 13.0–17.0)
Hemoglobin: 8.2 g/dL — ABNORMAL LOW (ref 13.0–17.0)
MCH: 29.5 pg (ref 26.0–34.0)
MCH: 30 pg (ref 26.0–34.0)
MCHC: 30.7 g/dL (ref 30.0–36.0)
MCHC: 31.8 g/dL (ref 30.0–36.0)
MCV: 96.2 fL (ref 78.0–100.0)
MCV: 94.5 fL (ref 78.0–100.0)
Platelets: 382 10*3/uL (ref 150–400)
Platelets: 349 10*3/uL (ref 150–400)
RBC: 2.88 MIL/uL — ABNORMAL LOW (ref 4.22–5.81)
RBC: 2.73 MIL/uL — AB (ref 4.22–5.81)
RDW: 17.3 % — ABNORMAL HIGH (ref 11.5–15.5)
RDW: 17.2 % — AB (ref 11.5–15.5)
WBC: 13.5 K/uL — ABNORMAL HIGH (ref 4.0–10.5)
WBC: 13.4 10*3/uL — AB (ref 4.0–10.5)

## 2017-04-18 LAB — BASIC METABOLIC PANEL
ANION GAP: 17 — AB (ref 5–15)
Anion gap: 18 — ABNORMAL HIGH (ref 5–15)
BUN: 31 mg/dL — AB (ref 6–20)
BUN: 36 mg/dL — ABNORMAL HIGH (ref 6–20)
CALCIUM: 8.9 mg/dL (ref 8.9–10.3)
CALCIUM: 8.7 mg/dL — AB (ref 8.9–10.3)
CHLORIDE: 96 mmol/L — AB (ref 101–111)
CO2: 24 mmol/L (ref 22–32)
CO2: 23 mmol/L (ref 22–32)
CREATININE: 7.92 mg/dL — AB (ref 0.61–1.24)
Chloride: 98 mmol/L — ABNORMAL LOW (ref 101–111)
Creatinine, Ser: 8.32 mg/dL — ABNORMAL HIGH (ref 0.61–1.24)
GFR calc Af Amer: 6 mL/min — ABNORMAL LOW (ref >60)
GFR calc non Af Amer: 6 mL/min — ABNORMAL LOW (ref >60)
GFR, EST AFRICAN AMERICAN: 6 mL/min — AB (ref >60)
GFR, EST NON AFRICAN AMERICAN: 5 mL/min — AB (ref >60)
GLUCOSE: 92 mg/dL (ref 65–99)
Glucose, Bld: 91 mg/dL (ref 65–99)
Potassium: 4.6 mmol/L (ref 3.5–5.1)
Potassium: 4.8 mmol/L (ref 3.5–5.1)
Sodium: 138 mmol/L (ref 135–145)
Sodium: 138 mmol/L (ref 135–145)

## 2017-04-18 LAB — HEPATIC FUNCTION PANEL
ALK PHOS: 62 U/L (ref 38–126)
ALT: 2152 U/L — ABNORMAL HIGH (ref 17–63)
AST: 2563 U/L — ABNORMAL HIGH (ref 15–41)
Albumin: 3.1 g/dL — ABNORMAL LOW (ref 3.5–5.0)
BILIRUBIN DIRECT: 0.2 mg/dL (ref 0.1–0.5)
BILIRUBIN INDIRECT: 1.2 mg/dL — AB (ref 0.3–0.9)
Total Bilirubin: 1.4 mg/dL — ABNORMAL HIGH (ref 0.3–1.2)
Total Protein: 6.3 g/dL — ABNORMAL LOW (ref 6.5–8.1)

## 2017-04-18 LAB — COMPREHENSIVE METABOLIC PANEL
ALT: 469 U/L — ABNORMAL HIGH (ref 17–63)
Alkaline Phosphatase: 68 U/L (ref 38–126)
BUN: 82 mg/dL — ABNORMAL HIGH (ref 6–20)
CO2: 17 mmol/L — ABNORMAL LOW (ref 22–32)
Calcium: 9 mg/dL (ref 8.9–10.3)
Creatinine, Ser: 16.02 mg/dL — ABNORMAL HIGH (ref 0.61–1.24)
GFR calc Af Amer: 3 mL/min — ABNORMAL LOW (ref >60)
GFR calc non Af Amer: 2 mL/min — ABNORMAL LOW (ref >60)

## 2017-04-18 LAB — COMPREHENSIVE METABOLIC PANEL WITH GFR
AST: 531 U/L — ABNORMAL HIGH (ref 15–41)
Albumin: 3.3 g/dL — ABNORMAL LOW (ref 3.5–5.0)
Anion gap: 27 — ABNORMAL HIGH (ref 5–15)
Chloride: 99 mmol/L — ABNORMAL LOW (ref 101–111)
Glucose, Bld: 97 mg/dL (ref 65–99)
Potassium: 6.9 mmol/L (ref 3.5–5.1)
Sodium: 143 mmol/L (ref 135–145)
Total Bilirubin: 1.1 mg/dL (ref 0.3–1.2)
Total Protein: 6.6 g/dL (ref 6.5–8.1)

## 2017-04-18 LAB — MAGNESIUM
Magnesium: 3.2 mg/dL — ABNORMAL HIGH (ref 1.7–2.4)
Magnesium: 2.5 mg/dL — ABNORMAL HIGH (ref 1.7–2.4)

## 2017-04-18 LAB — PROTIME-INR
INR: 1.77
Prothrombin Time: 20.5 seconds — ABNORMAL HIGH (ref 11.4–15.2)

## 2017-04-18 LAB — ECHOCARDIOGRAM COMPLETE
Height: 65 in
Weight: 3206.37 oz

## 2017-04-18 LAB — PHOSPHORUS: Phosphorus: 4.8 mg/dL — ABNORMAL HIGH (ref 2.5–4.6)

## 2017-04-18 LAB — TROPONIN I
Troponin I: 0.2 ng/mL (ref <0.03)
Troponin I: 0.22 ng/mL (ref <0.03)
Troponin I: 0.35 ng/mL (ref <0.03)
Troponin I: 0.39 ng/mL (ref <0.03)

## 2017-04-18 LAB — MRSA PCR SCREENING: MRSA by PCR: NEGATIVE

## 2017-04-18 LAB — GLUCOSE, CAPILLARY: Glucose-Capillary: 79 mg/dL (ref 65–99)

## 2017-04-18 LAB — TSH: TSH: 1.747 u[IU]/mL (ref 0.350–4.500)

## 2017-04-18 LAB — D-DIMER, QUANTITATIVE (NOT AT ARMC): D-Dimer, Quant: 2.46 ug/mL-FEU — ABNORMAL HIGH (ref 0.00–0.50)

## 2017-04-18 LAB — T4, FREE: FREE T4: 1.24 ng/dL — AB (ref 0.61–1.12)

## 2017-04-18 MED ORDER — SODIUM BICARBONATE 8.4 % IV SOLN
50.0000 meq | Freq: Once | INTRAVENOUS | Status: AC
  Administered 2017-04-18: 50 meq via INTRAVENOUS

## 2017-04-18 MED ORDER — DEXTROSE 50 % IV SOLN
1.0000 | Freq: Once | INTRAVENOUS | Status: AC
  Administered 2017-04-18: 50 mL via INTRAVENOUS

## 2017-04-18 MED ORDER — LORAZEPAM 2 MG/ML IJ SOLN
0.5000 mg | Freq: Once | INTRAMUSCULAR | Status: AC
  Administered 2017-04-18: 0.5 mg via INTRAVENOUS

## 2017-04-18 MED ORDER — IPRATROPIUM-ALBUTEROL 0.5-2.5 (3) MG/3ML IN SOLN
3.0000 mL | Freq: Four times a day (QID) | RESPIRATORY_TRACT | Status: DC | PRN
  Administered 2017-04-18: 3 mL via RESPIRATORY_TRACT
  Filled 2017-04-18: qty 3

## 2017-04-18 MED ORDER — SODIUM BICARBONATE 8.4 % IV SOLN
INTRAVENOUS | Status: AC
  Filled 2017-04-18: qty 50

## 2017-04-18 MED ORDER — DEXTROSE 50 % IV SOLN
INTRAVENOUS | Status: AC
  Filled 2017-04-18: qty 50

## 2017-04-18 MED ORDER — INSULIN ASPART 100 UNIT/ML IV SOLN
10.0000 [IU] | Freq: Once | INTRAVENOUS | Status: AC
  Administered 2017-04-18: 10 [IU] via INTRAVENOUS

## 2017-04-18 MED ORDER — LORAZEPAM 2 MG/ML IJ SOLN
INTRAMUSCULAR | Status: AC
  Filled 2017-04-18: qty 1

## 2017-04-18 NOTE — Progress Notes (Signed)
Dr. Maylene Roes aware of vital signs and patient status; will continue to monitor for now; await cardiology input.

## 2017-04-18 NOTE — Progress Notes (Signed)
  PROGRESS NOTE  Patient re-evaluated this afternoon after RN reported events during lunch. He had been taken off BiPAP and placed on 4L [place] O2. He was breathing well and stable. He was eating lunch when he reported that he felt like he was going to pass out, arms went limp, eyes rolled back, but he did not lose consciousness. BP 128/47 and HR 57 at the time of event.   On my examination, echo tech is setting up at bedside. Patient is lying in bed, on [place] O2. He is alert, oriented, answers questions appropriately and follows commands. He has no focal neurologic deficits. Bilateral UE strength is 5/5. HR on telemetry during my exam is 67 and BP 100s/70s. Patient had a pre-syncopal episode at rest but his bradycardia and hypotension is actually better than it was compared to earlier this morning. Continue to monitor. Labs ordered.   Dessa Phi, DO [facility] [email or web address] Password [email or web address] 04/18/2017, 1:48 PM

## 2017-04-18 NOTE — Progress Notes (Signed)
  Echocardiogram 2D Echocardiogram has been performed.  Sean Best 04/18/2017, 2:24 PM

## 2017-04-18 NOTE — Progress Notes (Signed)
PROGRESS NOTE    Sean Best  NFA:[id number] DOB: [birth year] DOA: 04/17/2017 PCP: Seward Carol, MD     Brief Narrative:  Sean Best is a 84 yo male with medical history significant of end-stage renal disease on Monday Wednesday Friday dialysis, hyperlipidemia, hypertension, aortic aneurysm, history of stroke, type 2 diabetes who comes in with shortness of breath.  Patient reports that last Friday he began to have cough, change in sputum production, and significant myalgias and chills.  He was seen by his PCP approximately 1 week ago and diagnosed with a possible pneumonia and given Augmentin.  Despite antibiotics, he continued to have profound fatigue, weakness and hence was unable to make his dialysis sessions on Wednesday and Friday.  He reports that his sputum cleared up and he just has small amounts of clear white sputum now.  He began to develop progressive shortness of breath, lower extremity edema, orthopnea, paroxysmal nocturnal dyspnea.  In the ED, his heart rate was in the 30-40s with complete heart block. Labs revealed K 5.8 with elevated LFT, BNP > 2000. Cardiology and Nephrology was consulted. Due to continued respiratory failure, he was placed on BiPAP and PCCM consulted.   Assessment & Plan:   Principal Problem:   Acute hypoxemic respiratory failure (HCC) Active Problems:   Anemia of chronic disease   DM (diabetes mellitus), type 2 with renal complications, diet controlled   Benign hypertension   Hyperlipidemia   H/O: stroke   ESRD on hemodialysis (HCC)   Heart block   Fluid overload   Elevated troponin  Acute hypoxemic respiratory failure -Due to fluid overload in the setting of missing dialysis -PCCM consulted, patient currently on BiPAP -Nephrology consulted, patient underwent HD 2/17 early AM  -Restrict fluids, weight daily, I/Os   Third-degree heart block -Cardiology consulted -Hold beta blocker and wait 48-72 hours for washout  -Echo pending   Elevated  troponin -Demand ischemia in setting of above -Cardiolgy consulted  -Echo pending   Lactic acidosis -Resolved   Hyperkalemia -Resolved after HD   Elevated LFTs -Possibly related to congestive hepatopathy / fluid overload  -Right upper quadrant ultrasound revealed partially contracted gallbladder containing stones and sludge. Diffuse gallbladder wall thickening of 7 mm. Wall thickening may be in part due to nondistention.  -He has no complaints of RUQ pain. Bilirubin is normal -Trend LFT   ESRD on HD MWF  -Per Nephrology  Hypertension -Hold metoprolol in setting of bradycardia -Low BP this morning, hold amlodipine, lisinopril  Hyperlipidemia -Continue lipitor   Chronic pain -Continue gabapentin     DVT prophylaxis: subq hep Code Status: full Family Communication: spoke with daughter over the phone for update  Disposition Plan: pending improvement in fluid status, HR    Consultants:   Cardiology  Nephrology  Procedures:   None   Antimicrobials:  Anti-infectives (From admission, onward)   None        Subjective: Patient reports feeling better than he did at time of admission. He remains comfortable on BiPAP this morning. He denies any chest pain or abdominal pain.   Objective: Vitals:   04/18/17 0837 04/18/17 0843 04/18/17 1100 04/18/17 1120  BP: (!) 134/95  (!) 102/51 (!) 102/51  Pulse: (!) 46  (!) 42 (!) 43  Resp: (!) 24  12 16   Temp:   98 F (36.7 C)   TempSrc:   Oral   SpO2: 100% 100% 100% 100%  Weight:      Height:  Intake/Output Summary (Last 24 hours) at 04/18/2017 1226 Last data filed at 04/18/2017 0723 Gross per 24 hour  Intake 110 ml  Output 4000 ml  Net -3890 ml   Filed Weights   04/17/17 2203 04/18/17 0323 04/18/17 0723  Weight: 94.1 kg (207 lb 7.3 oz) 94.9 kg (209 lb 3.5 oz) 90.9 kg (200 lb 6.4 oz)    Examination:  General exam: Appears calm and comfortable on BiPAP Respiratory system: diminished breath sounds.  Respiratory effort normal. On BiPAP without distress  Cardiovascular system: S1 & S2 heard, bradycardic. No JVD, murmurs, rubs, gallops or clicks. Gastrointestinal system: Abdomen is nondistended, soft and nontender. No organomegaly or masses felt. Normal bowel sounds heard. Central nervous system: Alert and oriented. No focal neurological deficits. Extremities: Symmetric  Skin: No rashes, lesions or ulcers Psychiatry: Judgement and insight appear normal. Mood & affect appropriate.   Data Reviewed: I have personally reviewed following labs and imaging studies  CBC: Recent Labs  Lab 04/17/17 1800 04/17/17 2249 04/18/17 0222  WBC 10.8* 10.3 13.5*  NEUTROABS 8.0*  --   --   HGB 9.0* 8.6* 8.5*  HCT 29.0* 27.2* 27.7*  MCV 96.3 97.1 96.2  PLT 349 355 631   Basic Metabolic Panel: Recent Labs  Lab 04/17/17 1800 04/17/17 2249 04/18/17 0222 04/18/17 0852  NA 141 140 143 138  K 5.8* 5.9* 6.9* 4.6  CL 97* 96* 99* 96*  CO2 20* 18* 17* 24  GLUCOSE 101* 91 97 91  BUN 74* 77* 82* 31*  CREATININE 15.52* 15.60* 16.02* 7.92*  CALCIUM 9.4 9.3 9.0 8.9  MG 3.0*  --  3.2*  --   PHOS  --  8.4*  --   --    GFR: Estimated Creatinine Clearance: 7.6 mL/min (A) (by C-G formula based on SCr of 7.92 mg/dL (H)). Liver Function Tests: Recent Labs  Lab 04/17/17 1800 04/17/17 2249 04/18/17 0222  AST 286*  --  531*  ALT 233*  --  469*  ALKPHOS 72  --  68  BILITOT 1.0  --  1.1  PROT 7.0  --  6.6  ALBUMIN 3.5 3.5 3.3*   No results for input(s): LIPASE, AMYLASE in the last 168 hours. No results for input(s): AMMONIA in the last 168 hours. Coagulation Profile: Recent Labs  Lab 04/18/17 0222  INR 1.77   Cardiac Enzymes: Recent Labs  Lab 04/17/17 1800 04/17/17 2249 04/18/17 0222 04/18/17 0852  TROPONINI 0.22* 0.20* 0.22* 0.35*   BNP (last 3 results) No results for input(s): PROBNP in the last 8760 hours. HbA1C: No results for input(s): HGBA1C in the last 72 hours. CBG: Recent Labs   Lab 04/18/17 0239  GLUCAP 79   Lipid Profile: No results for input(s): CHOL, HDL, LDLCALC, TRIG, CHOLHDL, LDLDIRECT in the last 72 hours. Thyroid Function Tests: Recent Labs    04/17/17 2249  TSH 1.747  FREET4 1.24*   Anemia Panel: No results for input(s): VITAMINB12, FOLATE, FERRITIN, TIBC, IRON, RETICCTPCT in the last 72 hours. Sepsis Labs: Recent Labs  Lab 04/18/17 0228 04/18/17 0513  LATICACIDVEN 7.5* 1.0    Recent Results (from the past 240 hour(s))  MRSA PCR Screening     Status: None   Collection Time: 04/17/17 10:22 PM  Result Value Ref Range Status   MRSA by PCR NEGATIVE NEGATIVE Final    Comment:        The GeneXpert MRSA Assay (FDA approved for NASAL specimens only), is one component of a comprehensive MRSA colonization surveillance  program. It is not intended to diagnose MRSA infection nor to guide or monitor treatment for MRSA infections. Performed at [facility], [place] [address] [place], [place] [address]        Radiology Studies: US Abdomen Complete  Result Date: 04/18/2017 CLINICAL DATA:  Elevated LFTs. EXAM: ABDOMEN ULTRASOUND COMPLETE COMPARISON:  Right upper quadrant ultrasound 03/01/2017. CT angiography 03/01/2017. FINDINGS: Gallbladder: Partially contracted containing stones and sludge. There is diffuse gallbladder wall thickening of 7 mm. No sonographic Dowis sign noted by sonographer. Common bile duct: Diameter: 5 mm, normal. Liver: No focal lesion identified. Within normal limits in parenchymal echogenicity. Portal vein is patent on color Doppler imaging with normal direction of blood flow towards the liver. IVC: No abnormality visualized. Pancreas: Visualized portion unremarkable. Spleen: Size and appearance within normal limits. Minimal perisplenic fluid. Right Kidney: Length: 9.3 cm. Cortical thinning with increased renal echogenicity. Cyst in the upper kidney measures 2.4 cm. No hydronephrosis. Left Kidney: Length: 9.4 cm. Cortical  thinning with increased renal echogenicity. Multiple small cysts largest measuring 1.9 and 2.1 cm in the mid kidney. No hydronephrosis. Abdominal aorta: Known infrarenal abdominal aortic aneurysm is not well visualized. Majority of the abdominal aorta is obscured by bowel gas. Other findings: None. IMPRESSION: 1. Partially contracted gallbladder containing stones and sludge. Diffuse gallbladder wall thickening of 7 mm. Wall thickening may be in part due to nondistention. No sonographic Casagrande sign. If there is clinical concern for acute cholecystitis, consider nuclear medicine HIDA scan. 2. Unremarkable sonographic appearance of the liver. No biliary dilatation. 3. Small echogenic kidneys with bilateral renal cysts consistent with chronic medical renal disease. 4. Trace perisplenic fluid. 5. Known infrarenal abdominal aortic aneurysm is obscured by bowel gas. Electronically Signed   By: Jeb Levering M.D.   On: 04/18/2017 00:07   Dg Chest Port 1 View  Result Date: 04/17/2017 CLINICAL DATA:  Dyspnea EXAM: PORTABLE CHEST 1 VIEW COMPARISON:  04/09/2017 FINDINGS: Stable cardiomegaly with aortic atherosclerosis. Interval development of mild interstitial edema. Pleural effusions however not apparent on current exam. No pneumonic consolidation. No acute osseous abnormality. IMPRESSION: 1. Stable cardiomegaly and aortic atherosclerosis. 2. Interval development of mild interstitial edema. Electronically Signed   By: Ashley Royalty M.D.   On: 04/17/2017 18:23      Scheduled Meds: . allopurinol  100 mg Oral Daily  . atorvastatin  40 mg Oral Daily  . cholecalciferol  1,000 Units Oral Daily  . [START ON 04/19/2017] cinacalcet  30 mg Oral Q M,W,F-HD  . gabapentin  100 mg Oral QHS  . heparin  5,000 Units Subcutaneous Q8H  . sevelamer carbonate  2,400 mg Oral TID WC  . sodium chloride flush  3 mL Intravenous Q12H   Continuous Infusions: . sodium chloride    . sodium chloride       LOS: 1 day    Time spent:  40 minutes   Dessa Phi, DO [facility] [email or web address] Password [facility] 04/18/2017, 12:26 PM

## 2017-04-18 NOTE — Progress Notes (Signed)
[facility] Progress Note   Subjective:  Seen in room. Comfortable on bipap HD overnight for K 6.9, 4.6 this am, Net UF 4L  Cards following for heart block, bradycardia     Objective Vitals:   04/18/17 0837 04/18/17 0843 04/18/17 1100 04/18/17 1120  BP: (!) 134/95  (!) 102/51 (!) 102/51  Pulse: (!) 46  (!) 42 (!) 43  Resp: (!) 24  12 16   Temp:   98 F (36.7 C)   TempSrc:   Oral   SpO2: 100% 100% 100% 100%  Weight:      Height:       Physical Exam General: WNWD male on bipap NAD  Heart: Bradycardic  Lungs: scattered rhonchi  Abdomen: soft NT  Extremities: trace LE edema  Dialysis Access: LUE AVF +bruit   Dialysis Orders: Center: [facility]  on MWF . EDW 94.5kg HD [place] 2K/  Time 4 hrs Heparin std. Access LAFVF BFR 400 DFR 800     Assessment/Plan: 1. Bradycardia- new heart block work up per cardiology.  Will hold toprol. Echo pending  2. Hyperkalemia, resolved with HD overnight  3. ESRD -  MWF. HD overnight for above. Short HD tomorrow to get back on schedule if HR allows.  4. Hypertension/volume  -  Volume overloaded will UF as tolerated. Post HD weight 90.9kg with 4L off, cont titrate volume down.  5. Anemia  - will resume esa 6. Metabolic bone disease -   On binders, sensipar, and vitamin D,, Renvela binder  7. Nutrition -  Renal diet 8. Pneumonia- was on abx but doesn't know what it was.  Maybe keflex as he knows the dose was 500mg     [name] PA-C [facility] Pager [phone] 04/18/2017,12:25 PM  LOS: 1 day   Additional Objective Labs: Basic Metabolic Panel: Recent Labs  Lab 04/17/17 2249 04/18/17 0222 04/18/17 0852  NA 140 143 138  K 5.9* 6.9* 4.6  CL 96* 99* 96*  CO2 18* 17* 24  GLUCOSE 91 97 91  BUN 77* 82* 31*  CREATININE 15.60* 16.02* 7.92*  CALCIUM 9.3 9.0 8.9  PHOS 8.4*  --   --    CBC: Recent Labs  Lab 04/17/17 1800 04/17/17 2249 04/18/17 0222  WBC 10.8* 10.3 13.5*  NEUTROABS 8.0*  --   --   HGB  9.0* 8.6* 8.5*  HCT 29.0* 27.2* 27.7*  MCV 96.3 97.1 96.2  PLT 349 355 382   Blood Culture    Component Value Date/Time   SDES BLOOD RIGHT HAND 12/28/2014 1725   SPECREQUEST BOTTLES DRAWN AEROBIC ONLY 6CC 12/28/2014 1725   CULT NO GROWTH 5 DAYS 12/28/2014 1725   REPTSTATUS 01/02/2015 FINAL 12/28/2014 1725    Cardiac Enzymes: Recent Labs  Lab 04/17/17 1800 04/17/17 2249 04/18/17 0222 04/18/17 0852  TROPONINI 0.22* 0.20* 0.22* 0.35*   CBG: Recent Labs  Lab 04/18/17 0239  GLUCAP 79   Iron Studies: No results for input(s): IRON, TIBC, TRANSFERRIN, FERRITIN in the last 72 hours. Lab Results  Component Value Date   INR 1.77 04/18/2017   INR 1.39 12/29/2014   INR 1.25 12/28/2014   Medications: . sodium chloride    . sodium chloride     . allopurinol  100 mg Oral Daily  . atorvastatin  40 mg Oral Daily  . cholecalciferol  1,000 Units Oral Daily  . [START ON 04/19/2017] cinacalcet  30 mg Oral Q M,W,F-HD  . gabapentin  100 mg Oral QHS  . heparin  5,000 Units Subcutaneous Q8H  . sevelamer carbonate  2,400 mg Oral TID WC  . sodium chloride flush  3 mL Intravenous Q12H

## 2017-04-18 NOTE — Progress Notes (Signed)
Patient was taken off BiPAP and placed on 4L/[place]; doing well, resps easy; able to take PO fluids without difficulty.  Patient given lunch tray and eating without difficulty.  Then as RN was about to administer morning medications, patient suddenly states "I think I'm going to pass out", arms went limp, eyes rolled back in his head.  Patient placed in reverse trendelenburg and BP taken - 128/47; HR 57 and irregular; sats 94% on O2 @ 4L/[place].  Patient did not completely pass out; however, RN continually calling his name throughout event.  Patient slowly recovers, states he is very weak.  Dr. Maylene Roes paged and updated.  New orders received for stat labs.  Dr. Maylene Roes states she will come evaluate at bedside.

## 2017-04-18 NOTE — Progress Notes (Addendum)
Brief Progress Note  Pt. Remains on BiPAP HD Completed 2/17 early am  With total  4.0 L off Improved saturations, hemodynamics and clinical appearance Potassium post HD is 4.6 Plan: Trial off BiPAP now BiPAP prn if needed Titrate nasal oxygen for sats of > 92% CXR now CXR 2/18 am BMET 2/18 am PCCM will check on patient 2/18 am. If continuing to improve, and no further need for BiPAP,  we will sign off.  Magdalen Spatz, AGACNP-BC [place] Pager # [phone] 04/18/2017  1:10 PM   Attending Note:  I have examined patient, reviewed labs, studies and notes. I have discussed the case with Gladstone Pih, and I agree with the data and plans as amended above.   84 yo man, ESRD c/b volume overload after missing sessions of HD as an outpt. He required BiPAP and urgent HD - had ~4L removed. AM 2/17 he was able to come off BiPAP successfully. Edema on serial CXR's improved, essentially resolved 2/17 film. Then 2/17 pm he suffered an apparent pre-syncopal episode. VS were stable at the time, including SpO2 on 4L/min [place], but note that he did have hypotension immediately post-HD. Eval for his syncope underway > ECG 3rd degree AV block. He has persistent AG metabolic acidosis, transaminitis as well, ? Shock liver. Cardiology to eval.   It would appear that BiPAP needs have resolved. Please call us if we can assist further.   Independent critical care time is 32 minutes on 2/17, none on 2/18.   Baltazar Apo, MD, PhD 04/19/2017, 8:12 AM [facility] [phone] or if no answer [phone]

## 2017-04-18 NOTE — Progress Notes (Signed)
Pt w/BP 69/57 (62); HR 44; sats 97% on BiPAP, FiO2 @ 60%; RR11.  Physician notified.  HD removed 4L this AM.  Patient is asymptomatic.

## 2017-04-18 NOTE — Progress Notes (Signed)
[place] Progress Note Patient Name: Sean Best DOB: 1933/08/20 MRN: [id number]   Date of Service  04/18/2017  HPI/Events of Note  Lactic Acid = 7.5. BP = 104/93. Last cardiac echo 04/19/2016 = 60-65% with mild AS and Grade 2 diastolic dysfunction. Hgb = 8.5.   eICU Interventions  Continue to trend Lactic Acid.      Intervention Category Major Interventions: Acid-Base disturbance - evaluation and management  Sommer,Steven Eugene 04/18/2017, 4:15 AM

## 2017-04-18 NOTE — Progress Notes (Signed)
DAILY PROGRESS NOTE   Patient Name: Sean Best Date of Encounter: 04/18/2017  Chief Complaint   Short of breath  Patient Profile   84 yo male with ESRD, not yet on dialysis, presented with shortness of breath and recent pneumonia on Augmentin. Noted to have progressive fatigue, weakness and HR in 30's-40's on admission in complete heart block.  Subjective   On bipap - resting comfortably. No chest pain. Has been dyspneic and fatigued. Telemetry shows complete heart block with escape rhythm in the 40's. Was on BB which is being held. Metabolic derangement also likely a complicating factor. Metabolic acidosis on presentation - hyperkalemia (6.9) has resolved. Troponin elevated to 0.35 - suspect demand ischemia or renal-related.  Objective   Vitals:   04/18/17 0837 04/18/17 0843 04/18/17 1100 04/18/17 1120  BP: (!) 134/95  (!) 102/51 (!) 102/51  Pulse: (!) 46  (!) 42 (!) 43  Resp: (!) '24  12 16  ' Temp:   98 F (36.7 C)   TempSrc:   Oral   SpO2: 100% 100% 100% 100%  Weight:      Height:        Intake/Output Summary (Last 24 hours) at 04/18/2017 1135 Last data filed at 04/18/2017 3875 Gross per 24 hour  Intake 110 ml  Output 4000 ml  Net -3890 ml   Filed Weights   04/17/17 2203 04/18/17 0323 04/18/17 0723  Weight: 207 lb 7.3 oz (94.1 kg) 209 lb 3.5 oz (94.9 kg) 200 lb 6.4 oz (90.9 kg)    Physical Exam   General appearance: alert, no distress and on bipap Neck: JVD - 3 cm above sternal notch, no carotid bruit and thyroid not enlarged, symmetric, no tenderness/mass/nodules Lungs: diminished breath sounds bilaterally Heart: regular bradycardia Abdomen: soft, non-tender; bowel sounds normal; no masses,  no organomegaly Extremities: extremities normal, atraumatic, no cyanosis or edema Pulses: 2+ and symmetric Skin: Skin color, texture, turgor normal. No rashes or lesions Neurologic: Grossly normal Psych: Pleasant  Inpatient Medications    Scheduled Meds: .  allopurinol  100 mg Oral Daily  . atorvastatin  40 mg Oral Daily  . cholecalciferol  1,000 Units Oral Daily  . [START ON 04/19/2017] cinacalcet  30 mg Oral Q M,W,F-HD  . gabapentin  100 mg Oral QHS  . heparin  5,000 Units Subcutaneous Q8H  . sevelamer carbonate  2,400 mg Oral TID WC  . sodium chloride flush  3 mL Intravenous Q12H    Continuous Infusions: . sodium chloride    . sodium chloride      PRN Meds: sodium chloride, sodium chloride, acetaminophen **OR** acetaminophen, alteplase, guaiFENesin-dextromethorphan, heparin, heparin, HYDROcodone-acetaminophen, ipratropium-albuterol, lidocaine (PF), lidocaine-prilocaine, ondansetron **OR** ondansetron (ZOFRAN) IV, pentafluoroprop-tetrafluoroeth, polyethylene glycol, sevelamer carbonate   Labs   Results for orders placed or performed during the hospital encounter of 04/17/17 (from the past 48 hour(s))  CBC with Differential/Platelet     Status: Abnormal   Collection Time: 04/17/17  6:00 PM  Result Value Ref Range   WBC 10.8 (H) 4.0 - 10.5 K/uL   RBC 3.01 (L) 4.22 - 5.81 MIL/uL   Hemoglobin 9.0 (L) 13.0 - 17.0 g/dL   HCT 29.0 (L) 39.0 - 52.0 %   MCV 96.3 78.0 - 100.0 fL   MCH 29.9 26.0 - 34.0 pg   MCHC 31.0 30.0 - 36.0 g/dL   RDW 17.2 (H) 11.5 - 15.5 %   Platelets 349 150 - 400 K/uL   Neutrophils Relative % 73 %   Neutro Abs  8.0 (H) 1.7 - 7.7 K/uL   Lymphocytes Relative 19 %   Lymphs Abs 2.1 0.7 - 4.0 K/uL   Monocytes Relative 6 %   Monocytes Absolute 0.6 0.1 - 1.0 K/uL   Eosinophils Relative 1 %   Eosinophils Absolute 0.1 0.0 - 0.7 K/uL   Basophils Relative 1 %   Basophils Absolute 0.1 0.0 - 0.1 K/uL    Comment: Performed at [facility] [address] [place], [place] [address]  Comprehensive metabolic panel     Status: Abnormal   Collection Time: 04/17/17  6:00 PM  Result Value Ref Range   Sodium 141 135 - 145 mmol/L   Potassium 5.8 (H) 3.5 - 5.1 mmol/L   Chloride 97 (L) 101 - 111 mmol/L   CO2 20 (L) 22 - 32  mmol/L   Glucose, Bld 101 (H) 65 - 99 mg/dL   BUN 74 (H) 6 - 20 mg/dL   Creatinine, Ser 15.52 (H) 0.61 - 1.24 mg/dL   Calcium 9.4 8.9 - 10.3 mg/dL   Total Protein 7.0 6.5 - 8.1 g/dL   Albumin 3.5 3.5 - 5.0 g/dL   AST 286 (H) 15 - 41 U/L   ALT 233 (H) 17 - 63 U/L   Alkaline Phosphatase 72 38 - 126 U/L   Total Bilirubin 1.0 0.3 - 1.2 mg/dL   GFR calc non Af Amer 2 (L) >60 mL/min   GFR calc Af Amer 3 (L) >60 mL/min    Comment: (NOTE) The eGFR has been calculated using the CKD EPI equation. This calculation has not been validated in all clinical situations. eGFR's persistently <60 mL/min signify possible Chronic Kidney Disease.    Anion gap 24 (H) 5 - 15    Comment: Performed at [facility], [place] [address], [place], [place] [address]  Brain natriuretic peptide     Status: Abnormal   Collection Time: 04/17/17  6:00 PM  Result Value Ref Range   B Natriuretic Peptide 2,812.2 (H) 0.0 - 100.0 pg/mL    Comment: Performed at [facility] [address], [place], [place] [address]  Troponin I     Status: Abnormal   Collection Time: 04/17/17  6:00 PM  Result Value Ref Range   Troponin I 0.22 (HH) <0.03 ng/mL    Comment: CRITICAL RESULT CALLED TO, READ BACK BY AND VERIFIED WITH: Cleatis Polka, RN 1900 04/17/17 THOMPSON V Performed at [facility], [place] [address] [place], [place] [address]   Magnesium     Status: Abnormal   Collection Time: 04/17/17  6:00 PM  Result Value Ref Range   Magnesium 3.0 (H) 1.7 - 2.4 mg/dL    Comment: Performed at [facility] [address] [place], [place] [address]  Influenza panel by PCR (type A & B)     Status: None   Collection Time: 04/17/17  9:02 PM  Result Value Ref Range   Influenza A By PCR NEGATIVE NEGATIVE   Influenza B By PCR NEGATIVE NEGATIVE    Comment: (NOTE) The Xpert Xpress Flu assay is intended as an aid in the diagnosis of  influenza and should not be used as a sole basis for treatment.  This  assay is FDA  approved for nasopharyngeal swab specimens only. Nasal  washings and aspirates are unacceptable for Xpert Xpress Flu testing. Performed at [facility], [place] [address], [place], [place] [address]   MRSA PCR Screening  Status: None   Collection Time: 04/17/17 10:22 PM  Result Value Ref Range   MRSA by PCR NEGATIVE NEGATIVE    Comment:        The GeneXpert MRSA Assay (FDA approved for NASAL specimens only), is one component of a comprehensive MRSA colonization surveillance program. It is not intended to diagnose MRSA infection nor to guide or monitor treatment for MRSA infections. Performed at [facility], [place] [address] [place], [place] [address]   TSH     Status: None   Collection Time: 04/17/17 10:49 PM  Result Value Ref Range   TSH 1.747 0.350 - 4.500 uIU/mL    Comment: Performed by a 3rd Generation assay with a functional sensitivity of <=0.01 uIU/mL. Performed at [facility], [place] [address], [place], [place] [address]   T4, free     Status: Abnormal   Collection Time: 04/17/17 10:49 PM  Result Value Ref Range   Free T4 1.24 (H) 0.61 - 1.12 ng/dL    Comment: (NOTE) Biotin ingestion may interfere with free T4 tests. If the results are inconsistent with the TSH level, previous test results, or the clinical presentation, then consider biotin interference. If needed, order repeat testing after stopping biotin. Performed at [facility], [place] [address], [place], [place] [address]   Renal function panel     Status: Abnormal   Collection Time: 04/17/17 10:49 PM  Result Value Ref Range   Sodium 140 135 - 145 mmol/L   Potassium 5.9 (H) 3.5 - 5.1 mmol/L   Chloride 96 (L) 101 - 111 mmol/L   CO2 18 (L) 22 - 32 mmol/L   Glucose, Bld 91 65 - 99 mg/dL   BUN 77 (H) 6 - 20 mg/dL   Creatinine, Ser 15.60 (H) 0.61 - 1.24 mg/dL   Calcium 9.3 8.9 - 10.3 mg/dL   Phosphorus 8.4 (H) 2.5 - 4.6 mg/dL   Albumin 3.5 3.5 - 5.0 g/dL   GFR calc non Af Amer 2 (L)  >60 mL/min   GFR calc Af Amer 3 (L) >60 mL/min    Comment: (NOTE) The eGFR has been calculated using the CKD EPI equation. This calculation has not been validated in all clinical situations. eGFR's persistently <60 mL/min signify possible Chronic Kidney Disease.    Anion gap 26 (H) 5 - 15    Comment: Performed at [facility], [place] [address], [place], [place] [address]  CBC     Status: Abnormal   Collection Time: 04/17/17 10:49 PM  Result Value Ref Range   WBC 10.3 4.0 - 10.5 K/uL   RBC 2.80 (L) 4.22 - 5.81 MIL/uL   Hemoglobin 8.6 (L) 13.0 - 17.0 g/dL   HCT 27.2 (L) 39.0 - 52.0 %   MCV 97.1 78.0 - 100.0 fL   MCH 30.7 26.0 - 34.0 pg   MCHC 31.6 30.0 - 36.0 g/dL   RDW 17.7 (H) 11.5 - 15.5 %   Platelets 355 150 - 400 K/uL    Comment: Performed at [facility], [place] [address], [place], [place] [address]  Troponin I (q 6hr x 3)     Status: Abnormal   Collection Time: 04/17/17 10:49 PM  Result Value Ref Range   Troponin I 0.20 (HH) <0.03 ng/mL    Comment: CRITICAL VALUE NOTED.  VALUE IS CONSISTENT WITH PREVIOUSLY REPORTED AND CALLED VALUE. Performed at [facility], [place] [address], [place], [place] [address]   Blood  gas, arterial     Status: Abnormal   Collection Time: 04/18/17  2:15 AM  Result Value Ref Range   FIO2 100.00    Delivery systems NON-REBREATHER OXYGEN MASK    pH, Arterial 7.271 (L) 7.350 - 7.450   pCO2 arterial 41.2 32.0 - 48.0 mmHg   pO2, Arterial 105 83.0 - 108.0 mmHg   Bicarbonate 18.3 (L) 20.0 - 28.0 mmol/L   Acid-base deficit 7.3 (H) 0.0 - 2.0 mmol/L   O2 Saturation 96.3 %   Patient temperature 98.6    Collection site RIGHT RADIAL    Drawn by [phone]    Sample type ARTERIAL DRAW    Allens test (pass/fail) PASS PASS  Comprehensive metabolic panel     Status: Abnormal   Collection Time: 04/18/17  2:22 AM  Result Value Ref Range   Sodium 143 135 - 145 mmol/L   Potassium 6.9 (HH) 3.5 - 5.1 mmol/L    Comment: NO VISIBLE HEMOLYSIS CRITICAL  RESULT CALLED TO, READ BACK BY AND VERIFIED WITH: [facility] T,RN 04/18/17 0412 WAYK    Chloride 99 (L) 101 - 111 mmol/L   CO2 17 (L) 22 - 32 mmol/L   Glucose, Bld 97 65 - 99 mg/dL   BUN 82 (H) 6 - 20 mg/dL   Creatinine, Ser 16.02 (H) 0.61 - 1.24 mg/dL   Calcium 9.0 8.9 - 10.3 mg/dL   Total Protein 6.6 6.5 - 8.1 g/dL   Albumin 3.3 (L) 3.5 - 5.0 g/dL   AST 531 (H) 15 - 41 U/L   ALT 469 (H) 17 - 63 U/L   Alkaline Phosphatase 68 38 - 126 U/L   Total Bilirubin 1.1 0.3 - 1.2 mg/dL   GFR calc non Af Amer 2 (L) >60 mL/min   GFR calc Af Amer 3 (L) >60 mL/min    Comment: (NOTE) The eGFR has been calculated using the CKD EPI equation. This calculation has not been validated in all clinical situations. eGFR's persistently <60 mL/min signify possible Chronic Kidney Disease.    Anion gap 27 (H) 5 - 15    Comment: Performed at [facility], [place] [address] [place], [place] [address]  CBC     Status: Abnormal   Collection Time: 04/18/17  2:22 AM  Result Value Ref Range   WBC 13.5 (H) 4.0 - 10.5 K/uL   RBC 2.88 (L) 4.22 - 5.81 MIL/uL   Hemoglobin 8.5 (L) 13.0 - 17.0 g/dL   HCT 27.7 (L) 39.0 - 52.0 %   MCV 96.2 78.0 - 100.0 fL   MCH 29.5 26.0 - 34.0 pg   MCHC 30.7 30.0 - 36.0 g/dL   RDW 17.3 (H) 11.5 - 15.5 %   Platelets 382 150 - 400 K/uL    Comment: Performed at [facility], [place] [address], [place], [place] [address]  Protime-INR     Status: Abnormal   Collection Time: 04/18/17  2:22 AM  Result Value Ref Range   Prothrombin Time 20.5 (H) 11.4 - 15.2 seconds   INR 1.77     Comment: Performed at [facility] [address] [place], [place] [address]  Troponin I (q 6hr x 3)     Status: Abnormal   Collection Time: 04/18/17  2:22 AM  Result Value Ref Range   Troponin I 0.22 (HH) <0.03 ng/mL    Comment: CRITICAL VALUE NOTED.  VALUE IS CONSISTENT WITH PREVIOUSLY REPORTED AND CALLED VALUE. Performed at [facility], [place]  [address], [place], [place] [address]     Magnesium     Status: Abnormal   Collection Time: 04/18/17  2:22 AM  Result Value Ref Range   Magnesium 3.2 (H) 1.7 - 2.4 mg/dL    Comment: Performed at [facility] [address], [place], [place] [address]  D-dimer, quantitative (not at [facility])     Status: Abnormal   Collection Time: 04/18/17  2:22 AM  Result Value Ref Range   D-Dimer, Quant 2.46 (H) 0.00 - 0.50 ug/mL-FEU    Comment: (NOTE) At the manufacturer cut-off of 0.50 ug/mL FEU, this assay has been documented to exclude PE with a sensitivity and negative predictive value of 97 to 99%.  At this time, this assay has not been approved by the FDA to exclude DVT/VTE. Results should be correlated with clinical presentation. Performed at [facility], [place] [address] [place], [place] [address]   Lactic acid, plasma     Status: Abnormal   Collection Time: 04/18/17  2:28 AM  Result Value Ref Range   Lactic Acid, Venous 7.5 (HH) 0.5 - 1.9 mmol/L    Comment: CRITICAL RESULT CALLED TO, READ BACK BY AND VERIFIED WITH: Jasper Riling T,RN 04/18/17 0351 WAYK Performed at [facility], [place] [address], [place], [place] [address]   Glucose, capillary     Status: None   Collection Time: 04/18/17  2:39 AM  Result Value Ref Range   Glucose-Capillary 79 65 - 99 mg/dL   Comment 1 Notify RN   Lactic acid, plasma     Status: None   Collection Time: 04/18/17  5:13 AM  Result Value Ref Range   Lactic Acid, Venous 1.0 0.5 - 1.9 mmol/L    Comment: Performed at [facility] [address] [place] [address], [place], [place] [address]  Troponin I (q 6hr x 3)     Status: Abnormal   Collection Time: 04/18/17  8:52 AM  Result Value Ref Range   Troponin I 0.35 (HH) <0.03 ng/mL    Comment: CRITICAL VALUE NOTED.  VALUE IS CONSISTENT WITH PREVIOUSLY REPORTED AND CALLED VALUE. Performed at [facility], [place] [address], [place], [place] [address]   Basic metabolic panel     Status: Abnormal   Collection Time: 04/18/17  8:52 AM  Result Value  Ref Range   Sodium 138 135 - 145 mmol/L   Potassium 4.6 3.5 - 5.1 mmol/L   Chloride 96 (L) 101 - 111 mmol/L   CO2 24 22 - 32 mmol/L   Glucose, Bld 91 65 - 99 mg/dL   BUN 31 (H) 6 - 20 mg/dL   Creatinine, Ser 7.92 (H) 0.61 - 1.24 mg/dL    Comment: DELTA CHECK NOTED   Calcium 8.9 8.9 - 10.3 mg/dL   GFR calc non Af Amer 6 (L) >60 mL/min   GFR calc Af Amer 6 (L) >60 mL/min    Comment: (NOTE) The eGFR has been calculated using the CKD EPI equation. This calculation has not been validated in all clinical situations. eGFR's persistently <60 mL/min signify possible Chronic Kidney Disease.    Anion gap 18 (H) 5 - 15    Comment: Performed at [facility], [place] [address], [place], [place] [address]    ECG   Complete heart block at 39 - Personally Reviewed  Telemetry   Complete heart block in the 40's - Personally Reviewed  Radiology    US Abdomen Complete  Result Date: 04/18/2017 CLINICAL  DATA:  Elevated LFTs. EXAM: ABDOMEN ULTRASOUND COMPLETE COMPARISON:  Right upper quadrant ultrasound 03/01/2017. CT angiography 03/01/2017. FINDINGS: Gallbladder: Partially contracted containing stones and sludge. There is diffuse gallbladder wall thickening of 7 mm. No sonographic Landgren sign noted by sonographer. Common bile duct: Diameter: 5 mm, normal. Liver: No focal lesion identified. Within normal limits in parenchymal echogenicity. Portal vein is patent on color Doppler imaging with normal direction of blood flow towards the liver. IVC: No abnormality visualized. Pancreas: Visualized portion unremarkable. Spleen: Size and appearance within normal limits. Minimal perisplenic fluid. Right Kidney: Length: 9.3 cm. Cortical thinning with increased renal echogenicity. Cyst in the upper kidney measures 2.4 cm. No hydronephrosis. Left Kidney: Length: 9.4 cm. Cortical thinning with increased renal echogenicity. Multiple small cysts largest measuring 1.9 and 2.1 cm in the mid kidney. No hydronephrosis.  Abdominal aorta: Known infrarenal abdominal aortic aneurysm is not well visualized. Majority of the abdominal aorta is obscured by bowel gas. Other findings: None. IMPRESSION: 1. Partially contracted gallbladder containing stones and sludge. Diffuse gallbladder wall thickening of 7 mm. Wall thickening may be in part due to nondistention. No sonographic Bannan sign. If there is clinical concern for acute cholecystitis, consider nuclear medicine HIDA scan. 2. Unremarkable sonographic appearance of the liver. No biliary dilatation. 3. Small echogenic kidneys with bilateral renal cysts consistent with chronic medical renal disease. 4. Trace perisplenic fluid. 5. Known infrarenal abdominal aortic aneurysm is obscured by bowel gas. Electronically Signed   By: Jeb Levering M.D.   On: 04/18/2017 00:07   Dg Chest Port 1 View  Result Date: 04/17/2017 CLINICAL DATA:  Dyspnea EXAM: PORTABLE CHEST 1 VIEW COMPARISON:  04/09/2017 FINDINGS: Stable cardiomegaly with aortic atherosclerosis. Interval development of mild interstitial edema. Pleural effusions however not apparent on current exam. No pneumonic consolidation. No acute osseous abnormality. IMPRESSION: 1. Stable cardiomegaly and aortic atherosclerosis. 2. Interval development of mild interstitial edema. Electronically Signed   By: Ashley Royalty M.D.   On: 04/17/2017 18:23    Cardiac Studies   Echo scheduled  Assessment   1. Active Problems: 2.   Anemia of chronic disease 3.   DM (diabetes mellitus), type 2 with renal complications, diet controlled 4.   Benign hypertension 5.   Hyperlipidemia 6.   H/O: stroke 7.   ESRD on hemodialysis ([facility]) 8.   Heart block 9.   Fluid overload 10.   Elevated troponin 11.   Plan   1. Complete heart block in the setting of BB use and ESRD with metabolic acidosis and hyperkalemia - these labs are improving. He has denied chest pain - only dyspnea and fatigue. Comfortable on bipap - troponin elevation likely demand  ischemia. Awaiting echo findings to r/o cardiomyopathy. Would hold off on IV heparin for now. Will have to wait 42-72 hours for BB washout and if CHB persists, will have EP eval for possible pacer. Currently hemodynamically stable.  Time Spent Directly with Patient:  I have spent a total of 25 minutes with the patient reviewing hospital notes, telemetry, EKGs, labs and examining the patient as well as establishing an assessment and plan that was discussed personally with the patient. > 50% of time was spent in direct patient care.  Length of Stay:  LOS: 1 day   Pixie Casino, MD, [facility], [place] Director of the Advanced Lipid Disorders &  Cardiovascular Risk Reduction Clinic Diplomate of the American Board of Clinical Lipidology Attending Cardiologist  Direct Dial: [phone]  Fax: [phone]  Website:  [email or web address] 04/18/2017, 11:35 AM

## 2017-04-18 NOTE — Progress Notes (Signed)
Lactic Acid 7.5 paged MD to make them aware.

## 2017-04-18 NOTE — Progress Notes (Signed)
EKG CRITICAL VALUE     12 lead EKG performed.  Critical value noted.  Noberto Retort, RN notified.   Delfin Edis, [place] 04/18/2017 9:27 AM

## 2017-04-18 NOTE — Consult Note (Signed)
Name: Sean Best MRN: [id number] DOB: [birth year]    ADMISSION DATE:  04/17/2017 CONSULTATION DATE:  04/18/2017  REFERRING MD :  Dr. Herbert Moors   CHIEF COMPLAINT:  Dyspnea   HISTORY OF PRESENT ILLNESS:   84 year old male with PMH of ESRD on MWF, HTN, HLD, Aortic Aneurysm, CVA, DM  Presents to ED on 2/16 with shortness of breath and lower extremity edema. Reports that since last Friday he has been weak with cough, sputum production, and chills. He states he has been to sick to go to HD and missed 2 sessions. Upon arrival to ED K 5.8, EKG with prolonged PR interval. CXR with pulmonary edema. Admitted to step-down with plans for emergent HD. Later in the day patient had increased shortness of breath requiring BiPAP. PCCM was consulted.   On evaluation patient is hypertensive, hypoxic, and tachypneic. Nurse reports that HD has not been completed as of yet, however, HD nurse states that patient is scheduled in the next 30-45 minutes.   SIGNIFICANT EVENTS  2/16 > Presents to ED   STUDIES:  CXR 2/16 > Stable cardiomegaly with aortic atherosclerosis. Interval development of mild interstitial edema. Pleural effusions however not apparent on current exam. No pneumonic consolidation. No acute osseous abnormality  PAST MEDICAL HISTORY :   has a past medical history of Anemia, Arthritis, Blind left eye, Chronic total occlusion of artery of the extremities (HCC), Claudication ([place]), ESRD on dialysis ([place]), ESRD on peritoneal dialysis ([place]), Gout, Hyperlipidemia, Hypertension, Overweight(278.02), Prostate cancer ([place]) (1990s), Shingles, Stroke ([place]) (~1998), and Type II diabetes mellitus ([place]).  has a past surgical history that includes AV fistula placement, radiocephalic (85/63/1497); CAPD insertion (N/A, 03/06/2013); Umbilical hernia repair (N/A, 03/06/2013); Hernia repair; Insertion prostate radiation seed; Cataract extraction (Right); and Eye surgery (Left, 1970s). Prior to Admission medications     Medication Sig Start Date End Date Taking? Authorizing Provider  acetaminophen (TYLENOL) 500 MG tablet Take 1,000 mg by mouth daily as needed for headache.   Yes [provider]  allopurinol (ZYLOPRIM) 100 MG tablet Take 100 mg by mouth daily.     Yes [provider]  amLODipine (NORVASC) 5 MG tablet Take 5 mg by mouth at bedtime. 01/04/17  Yes [provider]  amoxicillin-clavulanate (AUGMENTIN) 500-125 MG tablet Take 1 tablet by mouth every 12 (twelve) hours. 10 day course started 04/09/17 04/09/17  Yes [provider]  atorvastatin (LIPITOR) 40 MG tablet Take 40 mg by mouth daily.     Yes [provider]  Cholecalciferol (VITAMIN D-3) 5000 units TABS Take 10,000 Units by mouth daily.   Yes [provider]  gabapentin (NEURONTIN) 100 MG capsule Take 100 mg by mouth at bedtime. 12/27/16  Yes [provider]  ibuprofen (ADVIL,MOTRIN) 200 MG tablet Take 800 mg by mouth daily as needed for headache (pain).   Yes [provider]  lisinopril (PRINIVIL,ZESTRIL) 10 MG tablet Take 10 mg by mouth at bedtime. 03/25/17  Yes [provider]  loperamide (IMODIUM A-D) 2 MG tablet Take 4 mg by mouth 4 (four) times daily as needed for diarrhea or loose stools.   Yes [provider]  metoprolol succinate (TOPROL-XL) 100 MG 24 hr tablet Take 100 mg by mouth at bedtime. 02/24/17  Yes [provider]  multivitamin (RENA-VIT) TABS tablet Take 1 tablet by mouth daily.   Yes [provider]  promethazine-dextromethorphan (PROMETHAZINE-DM) 6.25-15 MG/5ML syrup Take 5 mLs by mouth every 6 (six) hours as needed for cough.  Yes [provider]  SENSIPAR 30 MG tablet Take 30 mg by mouth every Monday, Wednesday, and Friday with hemodialysis.  12/12/14  Yes [provider]  sevelamer carbonate (RENVELA) 800 MG tablet Take 1,600-2,400 mg by mouth See admin instructions. Take 3 tablets (2400 mg) by mouth 3  times daily with meals and 2 tablets (1600 mg)  with snacks   Yes [provider]  diphenoxylate-atropine (LOMOTIL) 2.5-0.025 MG per tablet TAKE 1 TABLET BY MOUTH EVERY 4 TO 6 HOURS AS NEEDED Patient not taking: Reported on 04/17/2017 10/09/14   Zehr, Janett Billow D, PA-C  ondansetron (ZOFRAN ODT) 8 MG disintegrating tablet Take 1 tablet (8 mg total) by mouth every 8 (eight) hours as needed for nausea or vomiting. Patient not taking: Reported on 04/17/2017 03/01/17   Jola Schmidt, MD   Allergies  Allergen Reactions  . Cardura [Doxazosin Mesylate] Other (See Comments)    Hallucinations    FAMILY HISTORY:  family history includes Cancer in his father and mother; Heart disease in his mother. SOCIAL HISTORY:  reports that  has never smoked. he has never used smokeless tobacco. He reports that he does not drink alcohol or use drugs.  REVIEW OF SYSTEMS:   All negative; except for those that are bolded, which indicate positives. Constitutional: weight loss, weight gain, night sweats, fevers, chills, fatigue, weakness.  HEENT: headaches, sore throat, sneezing, nasal congestion, post nasal drip, difficulty swallowing, tooth/dental problems, visual complaints, visual changes, ear aches. Neuro: difficulty with speech, weakness, numbness, ataxia. CV:  chest pain, orthopnea, PND, swelling in lower extremities, dizziness, palpitations, syncope.  Resp: cough, hemoptysis, dyspnea, wheezing. GI: heartburn, indigestion, abdominal pain, nausea, vomiting, diarrhea, constipation, change in bowel habits, loss of appetite, hematemesis, melena, hematochezia.  GU: dysuria, change in color of urine, urgency or frequency, flank pain, hematuria. MSK: joint pain or swelling, decreased range of motion. Psych: change in mood or affect, depression, anxiety, suicidal ideations, homicidal ideations. Skin: rash, itching, bruising.  SUBJECTIVE:   VITAL SIGNS: Temp:  [97.3 F (36.3 C)-97.7 F (36.5 C)] 97.7 F (36.5  C) (02/17 0323) Pulse Rate:  [34-148] 148 (02/17 0430) Resp:  [15-31] 15 (02/17 0430) BP: (101-173)/(49-93) 118/90 (02/17 0430) SpO2:  [84 %-100 %] 98 % (02/17 0430) FiO2 (%):  [50 %] 50 % (02/17 0145) Weight:  [94.1 kg (207 lb 7.3 oz)-108.9 kg (240 lb)] 97.7 kg (215 lb 6.2 oz) (02/17 0323)  PHYSICAL EXAMINATION: General:  Elderly male, mild respiratory distress  Neuro:  Alert, oriented, follows commands  HEENT:  Dry MM  Cardiovascular:  Loletha Grayer, no MRG  Lungs:  Crackles, cardiac wheeze  Abdomen:  Obese, active bowel sounds  Musculoskeletal:  +2 BLE  Skin:  Warm, dry, intact   Recent Labs  Lab 04/17/17 1800 04/17/17 2249 04/18/17 0222  NA 141 140 143  K 5.8* 5.9* 6.9*  CL 97* 96* 99*  CO2 20* 18* 17*  BUN 74* 77* 82*  CREATININE 15.52* 15.60* 16.02*  GLUCOSE 101* 91 97   Recent Labs  Lab 04/17/17 1800 04/17/17 2249 04/18/17 0222  HGB 9.0* 8.6* 8.5*  HCT 29.0* 27.2* 27.7*  WBC 10.8* 10.3 13.5*  PLT 349 355 382   US Abdomen Complete  Result Date: 04/18/2017 CLINICAL DATA:  Elevated LFTs. EXAM: ABDOMEN ULTRASOUND COMPLETE COMPARISON:  Right upper quadrant ultrasound 03/01/2017. CT angiography 03/01/2017. FINDINGS: Gallbladder: Partially contracted containing stones and sludge. There is diffuse gallbladder wall thickening of 7 mm. No sonographic Hersh sign noted by sonographer. Common bile duct:  Diameter: 5 mm, normal. Liver: No focal lesion identified. Within normal limits in parenchymal echogenicity. Portal vein is patent on color Doppler imaging with normal direction of blood flow towards the liver. IVC: No abnormality visualized. Pancreas: Visualized portion unremarkable. Spleen: Size and appearance within normal limits. Minimal perisplenic fluid. Right Kidney: Length: 9.3 cm. Cortical thinning with increased renal echogenicity. Cyst in the upper kidney measures 2.4 cm. No hydronephrosis. Left Kidney: Length: 9.4 cm. Cortical thinning with increased renal echogenicity.  Multiple small cysts largest measuring 1.9 and 2.1 cm in the mid kidney. No hydronephrosis. Abdominal aorta: Known infrarenal abdominal aortic aneurysm is not well visualized. Majority of the abdominal aorta is obscured by bowel gas. Other findings: None. IMPRESSION: 1. Partially contracted gallbladder containing stones and sludge. Diffuse gallbladder wall thickening of 7 mm. Wall thickening may be in part due to nondistention. No sonographic Siegman sign. If there is clinical concern for acute cholecystitis, consider nuclear medicine HIDA scan. 2. Unremarkable sonographic appearance of the liver. No biliary dilatation. 3. Small echogenic kidneys with bilateral renal cysts consistent with chronic medical renal disease. 4. Trace perisplenic fluid. 5. Known infrarenal abdominal aortic aneurysm is obscured by bowel gas. Electronically Signed   By: Jeb Levering M.D.   On: 04/18/2017 00:07   Dg Chest Port 1 View  Result Date: 04/17/2017 CLINICAL DATA:  Dyspnea EXAM: PORTABLE CHEST 1 VIEW COMPARISON:  04/09/2017 FINDINGS: Stable cardiomegaly with aortic atherosclerosis. Interval development of mild interstitial edema. Pleural effusions however not apparent on current exam. No pneumonic consolidation. No acute osseous abnormality. IMPRESSION: 1. Stable cardiomegaly and aortic atherosclerosis. 2. Interval development of mild interstitial edema. Electronically Signed   By: Ashley Royalty M.D.   On: 04/17/2017 18:23    ASSESSMENT / PLAN:  Acute Hypoxic Respiratory Distress in setting of volume overload due to missed dialysis sessions  Hyperkalemia  HTN  H/O ESRD on HD MWF Plan  -BiPAP PRN -Wean Supplemental Oxygen to Maintain Saturation >92  -Spoke to HD nurse who states she will be at bedside at 0300  -Trend CXR -Cardiac Monitoring  -EKG  -While waiting on HD will temporize > Give Insulin/Dextrose and one amp of Bicarb   Will re-evaluate after HD to see if respiratory status has improved. > as of 0600  patient is 2 hours into 4 hour treatment (removed 2.3 L so far) and has improved BP, maintains on BiPAP appears comfortable with improved oxygenation.   Hayden Pedro, AGACNP-BC [facility]  Pgr: [phone]  PCCM Pgr: [phone]

## 2017-04-18 NOTE — Progress Notes (Signed)
RT Note: Patient was found still on 100% oxygen this morning. He is awake and alert and able to follow commands with no complications. He presently does not appear to be in any acute distress. RT notified him that we are lowering down his oxygen presently to ensure that he can maintain his saturation and respiratory status prior to attempting a trial off of Bipap. He understands and agrees with that plan. RT will continue to monitor, wean O2, and hopefully transition him off of Bipap if able to.

## 2017-04-18 NOTE — Progress Notes (Signed)
Picked patient up from previous RN due to staffing changes. Patient has audible wheezing and struggling with breathing sats down in the 70's. He was placed on NRB mask. HR on admission in the 40's. Primary MD notified with my concerns about the patient. Labs order and BiPAP and PCCM consulted. Patient waiting on HD in the room however they have not been able to start as soon as we wanted with only one RN. RT at bedside and able to start BiPAP per orders. PCCM  arrived to bedside too assess patient and orders received meds given. RN then arrived to room to start HD. I will continue to monitor patient.

## 2017-04-19 ENCOUNTER — Other Ambulatory Visit: Payer: Self-pay

## 2017-04-19 ENCOUNTER — Encounter (HOSPITAL_COMMUNITY)
Admission: EM | Disposition: A | Discharge status: Home Health | Payer: Self-pay | Source: Home / Self Care | Attending: Internal Medicine

## 2017-04-19 ENCOUNTER — Inpatient Hospital Stay (HOSPITAL_COMMUNITY): Payer: Medicare Other

## 2017-04-19 DIAGNOSIS — J9601 Acute respiratory failure with hypoxia: Secondary | ICD-10-CM

## 2017-04-19 DIAGNOSIS — I459 Conduction disorder, unspecified: Secondary | ICD-10-CM

## 2017-04-19 LAB — CBC
HCT: 26.9 % — ABNORMAL LOW (ref 39.0–52.0)
Hemoglobin: 8.4 g/dL — ABNORMAL LOW (ref 13.0–17.0)
MCH: 30.1 pg (ref 26.0–34.0)
MCHC: 31.2 g/dL (ref 30.0–36.0)
MCV: 96.4 fL (ref 78.0–100.0)
Platelets: 312 10*3/uL (ref 150–400)
RBC: 2.79 MIL/uL — ABNORMAL LOW (ref 4.22–5.81)
RDW: 17.4 % — ABNORMAL HIGH (ref 11.5–15.5)
WBC: 11.8 10*3/uL — ABNORMAL HIGH (ref 4.0–10.5)

## 2017-04-19 LAB — HEPATITIS PANEL, ACUTE
HCV Ab: <0.1 {s_co_ratio} (ref 0.0–0.9)
Hep A IgM: NEGATIVE
Hep B C IgM: NEGATIVE
Hepatitis B Surface Ag: NEGATIVE

## 2017-04-19 LAB — COMPREHENSIVE METABOLIC PANEL WITH GFR
ALT: 2349 U/L — ABNORMAL HIGH (ref 17–63)
AST: 1965 U/L — ABNORMAL HIGH (ref 15–41)
Albumin: 2.8 g/dL — ABNORMAL LOW (ref 3.5–5.0)
Alkaline Phosphatase: 61 U/L (ref 38–126)
Anion gap: 16 — ABNORMAL HIGH (ref 5–15)
BUN: 46 mg/dL — ABNORMAL HIGH (ref 6–20)
CO2: 25 mmol/L (ref 22–32)
Calcium: 8.7 mg/dL — ABNORMAL LOW (ref 8.9–10.3)
Chloride: 96 mmol/L — ABNORMAL LOW (ref 101–111)
Creatinine, Ser: 9.8 mg/dL — ABNORMAL HIGH (ref 0.61–1.24)
GFR calc Af Amer: 5 mL/min — ABNORMAL LOW
GFR calc non Af Amer: 4 mL/min — ABNORMAL LOW
Glucose, Bld: 87 mg/dL (ref 65–99)
Potassium: 4.9 mmol/L (ref 3.5–5.1)
Sodium: 137 mmol/L (ref 135–145)
Total Bilirubin: 1.5 mg/dL — ABNORMAL HIGH (ref 0.3–1.2)
Total Protein: 5.9 g/dL — ABNORMAL LOW (ref 6.5–8.1)

## 2017-04-19 LAB — T3, FREE: T3 FREE: 1.5 pg/mL — AB (ref 2.0–4.4)

## 2017-04-19 SURGERY — PACEMAKER IMPLANT

## 2017-04-19 MED ORDER — CEFAZOLIN SODIUM-DEXTROSE 2-4 GM/100ML-% IV SOLN
2.0000 g | INTRAVENOUS | Status: AC
  Administered 2017-04-20: 2 g via INTRAVENOUS
  Filled 2017-04-19: qty 100

## 2017-04-19 MED ORDER — TRAMADOL HCL 50 MG PO TABS
50.0000 mg | ORAL_TABLET | Freq: Four times a day (QID) | ORAL | Status: DC | PRN
  Administered 2017-04-19 – 2017-04-23 (×3): 50 mg via ORAL
  Filled 2017-04-19 (×2): qty 1

## 2017-04-19 MED ORDER — SODIUM CHLORIDE 0.9 % IV SOLN
INTRAVENOUS | Status: DC
  Administered 2017-04-20: 05:00:00 via INTRAVENOUS

## 2017-04-19 MED ORDER — SODIUM CHLORIDE 0.9 % IV SOLN: INTRAVENOUS | Status: DC

## 2017-04-19 MED ORDER — LISINOPRIL 10 MG PO TABS
10.0000 mg | ORAL_TABLET | Freq: Every day | ORAL | Status: DC
  Administered 2017-04-19 – 2017-04-22 (×4): 10 mg via ORAL
  Filled 2017-04-19 (×4): qty 1

## 2017-04-19 MED ORDER — AMLODIPINE BESYLATE 5 MG PO TABS
5.0000 mg | ORAL_TABLET | Freq: Every day | ORAL | Status: DC
  Administered 2017-04-19 – 2017-04-22 (×4): 5 mg via ORAL
  Filled 2017-04-19 (×4): qty 1

## 2017-04-19 MED ORDER — SODIUM CHLORIDE 0.9 % IR SOLN
80.0000 mg | Status: AC
  Administered 2017-04-20: 80 mg
  Filled 2017-04-19: qty 2

## 2017-04-19 MED ORDER — CHLORHEXIDINE GLUCONATE 4 % EX LIQD
60.0000 mL | Freq: Once | CUTANEOUS | Status: AC
  Administered 2017-04-20: 4 via TOPICAL
  Filled 2017-04-19: qty 15

## 2017-04-19 MED ORDER — CHLORHEXIDINE GLUCONATE 4 % EX LIQD
60.0000 mL | Freq: Once | CUTANEOUS | Status: AC
  Administered 2017-04-19: 4 via TOPICAL

## 2017-04-19 NOTE — Progress Notes (Signed)
[facility] Progress Note   Subjective: stable on HD this am, no SOB or CP   Objective Vitals:   04/18/17 1937 04/18/17 2000 04/19/17 0000 04/19/17 0500  BP: (!) 188/91 (!) 201/78 (!) 158/54   Pulse: (!) 56 (!) 31 (!) 43   Resp: 17 20 19    Temp: 98.7 F (37.1 C)     TempSrc: Oral     SpO2: 100% 96% 100%   Weight:    91 kg (200 lb 9.9 oz)  Height:       Physical Exam General: WNWD male, no distress Heart: Bradycardic .no mrg Lungs: clear bilat Abdomen: soft NT  Extremities: no edema Dialysis Access: LUE AVF +bruit   Dialysis: [facility] MWF 4h  2/ bath  94.5kg   Hep std   LUE AVF  400/800   Assessment: 1. Bradycardia/ CHB - persistent despite rx of ^K+ and stopping beta-blocker.  Per cards 2. Hyperkalemia - resolved 3. ESRD - HD MWF. HD early Sun am, HD today to get back on schedule.  4. HTN/ vol excess-  Vol down w HD, lower dry wt at dc. Resume CCB/ acei.   5. Anemia ckd - will resume esa 6. Metabolic bone disease -   On binders, sensipar, and vitamin D,, Renvela binder  7. Nutrition -  Renal diet   P -  HD today, UF as tol, resume norvasc/ lisinopril   Kelly Splinter MD [employer] pgr [phone]   04/19/2017, 9:05 AM      Additional Objective Labs: Basic Metabolic Panel: Recent Labs  Lab 04/17/17 2249  04/18/17 0852 04/18/17 1437 04/19/17 0206  NA 140   < > 138 138 137  K 5.9*   < > 4.6 4.8 4.9  CL 96*   < > 96* 98* 96*  CO2 18*   < > 24 23 25   GLUCOSE 91   < > 91 92 87  BUN 77*   < > 31* 36* 46*  CREATININE 15.60*   < > 7.92* 8.32* 9.80*  CALCIUM 9.3   < > 8.9 8.7* 8.7*  PHOS 8.4*  --   --  4.8*  --    < > = values in this interval not displayed.   CBC: Recent Labs  Lab 04/17/17 1800 04/17/17 2249 04/18/17 0222 04/18/17 1437 04/19/17 0206  WBC 10.8* 10.3 13.5* 13.4* 11.8*  NEUTROABS 8.0*  --   --   --   --   HGB 9.0* 8.6* 8.5* 8.2* 8.4*  HCT 29.0* 27.2* 27.7* 25.8* 26.9*  MCV 96.3 97.1 96.2 94.5 96.4   PLT 349 355 382 349 312   Blood Culture    Component Value Date/Time   SDES BLOOD RIGHT HAND 12/28/2014 1725   SPECREQUEST BOTTLES DRAWN AEROBIC ONLY 6CC 12/28/2014 1725   CULT NO GROWTH 5 DAYS 12/28/2014 1725   REPTSTATUS 01/02/2015 FINAL 12/28/2014 1725    Cardiac Enzymes: Recent Labs  Lab 04/17/17 1800 04/17/17 2249 04/18/17 0222 04/18/17 0852 04/18/17 1437  TROPONINI 0.22* 0.20* 0.22* 0.35* 0.39*   CBG: Recent Labs  Lab 04/18/17 0239  GLUCAP 79   Iron Studies: No results for input(s): IRON, TIBC, TRANSFERRIN, FERRITIN in the last 72 hours. Lab Results  Component Value Date   INR 1.77 04/18/2017   INR 1.39 12/29/2014   INR 1.25 12/28/2014   Medications: . sodium chloride    . sodium chloride     . allopurinol  100 mg Oral Daily  . cholecalciferol  1,000 Units Oral Daily  . cinacalcet  30 mg Oral Q M,W,F-HD  . gabapentin  100 mg Oral QHS  . heparin  5,000 Units Subcutaneous Q8H  . sevelamer carbonate  2,400 mg Oral TID WC  . sodium chloride flush  3 mL Intravenous Q12H

## 2017-04-19 NOTE — Progress Notes (Addendum)
PROGRESS NOTE    Cable Fearn  IWL:[id number] DOB: Apr 30, 1933 DOA: 04/17/2017 PCP: Seward Carol, MD     Brief Narrative:  Major Santerre is a 84 yo male with medical history significant of end-stage renal disease on Monday Wednesday Friday dialysis, hyperlipidemia, hypertension, aortic aneurysm, history of stroke, type 2 diabetes who comes in with shortness of breath.  Patient reports that last Friday he began to have cough, change in sputum production, and significant myalgias and chills.  He was seen by his PCP approximately 1 week ago and diagnosed with a possible pneumonia and given Augmentin.  Despite antibiotics, he continued to have profound fatigue, weakness and hence was unable to make his dialysis sessions on Wednesday and Friday.  He reports that his sputum cleared up and he just has small amounts of clear white sputum now.  He began to develop progressive shortness of breath, lower extremity edema, orthopnea, paroxysmal nocturnal dyspnea.  In the ED, his heart rate was in the 30-40s with complete heart block. Labs revealed K 5.8 with elevated LFT, BNP > 2000. Cardiology and Nephrology was consulted. Due to continued respiratory failure, he was placed on BiPAP and PCCM consulted. He underwent urgent HD on early morning 2/17 and weaned off BiPAP. He had a brief pre-syncopal episode on afternoon of 2/17.   Assessment & Plan:   Principal Problem:   Acute hypoxemic respiratory failure (HCC) Active Problems:   Anemia of chronic disease   DM (diabetes mellitus), type 2 with renal complications, diet controlled   Benign hypertension   Hyperlipidemia   H/O: stroke   ESRD on hemodialysis (HCC)   Heart block   Fluid overload   Elevated troponin  Acute hypoxemic respiratory failure -Due to fluid overload in the setting of missing dialysis -PCCM consulted and now signed off as patient has successfully weaned off BiPAP  -Nephrology consulted, patient underwent HD 2/17 early AM. Repeat CXR  without pulmonary edema -Restrict fluids, weight daily, I/Os  -Wean off [place] O2 as able   Third-degree heart block -Cardiology consulted -Hold beta blocker -Cardiology consulted EP to evaluate   Elevated LFTs -In setting of congestive hepatopathy / fluid overload / hypotension / hypoxia  -Right upper quadrant ultrasound revealed partially contracted gallbladder containing stones and sludge. Diffuse gallbladder wall thickening of 7 mm. Wall thickening may be in part due to nondistention.  -He has no complaints of RUQ pain. Direct bilirubin is normal -Hepatitis panel pending  -Continue to trend LFT  Elevated troponin -Demand ischemia in setting of above -Cardiolgy consulted  -Echo with no wall motion abnormalities. Follow up as outpatient with [facility]  -Denies chest pain this morning   Lactic acidosis -Resolved   Hyperkalemia -Resolved after HD   ESRD on HD MWF  -Per Nephrology  Hypertension -Hold metoprolol in setting of bradycardia -Continue amlodipine, lisinopril  Hyperlipidemia -Hold lipitor in setting of elevated LFT   Chronic pain -Continue gabapentin     DVT prophylaxis: subq hep Code Status: full Family Communication: no family at bedside, spoke w daughter over the phone  Disposition Plan: pending EP consultation, improvement in HR    Consultants:   Cardiology  Nephrology  EP  Procedures:   None   Antimicrobials:  Anti-infectives (From admission, onward)   None       Subjective: Patient seen in HD. He is resting comfortably and states his night was uneventful. No further episodes of pre-syncope. No complaints of chest pain/pressure, SOB, n/v. Feeling well overall.    Objective: Vitals:  04/19/17 0000 04/19/17 0500 04/19/17 0715 04/19/17 0732  BP: (!) 158/54  (!) 141/69 (!) 152/68  Pulse: (!) 43  (!) 43 (!) 42  Resp: 19  20 18   Temp:   98.2 F (36.8 C)   TempSrc:   Oral   SpO2: 100%  94%   Weight:  91 kg (200 lb 9.9 oz) 89.8  kg (197 lb 15.6 oz)   Height:        Intake/Output Summary (Last 24 hours) at 04/19/2017 0913 Last data filed at 04/18/2017 2000 Gross per 24 hour  Intake 100 ml  Output -  Net 100 ml   Filed Weights   04/18/17 0723 04/19/17 0500 04/19/17 0715  Weight: 90.9 kg (200 lb 6.4 oz) 91 kg (200 lb 9.9 oz) 89.8 kg (197 lb 15.6 oz)    Examination: General exam: Appears calm and comfortable  Respiratory system: Clear to auscultation. Respiratory effort normal. On [place] O2 satting 99%-100% Cardiovascular system: S1 & S2 heard, bradycardic in the 40s. No JVD, murmurs, rubs, gallops or clicks.  Gastrointestinal system: Abdomen is nondistended, soft and nontender. No organomegaly or masses felt. Normal bowel sounds heard. Central nervous system: Alert and oriented. No focal neurological deficits. Extremities: Symmetric Skin: No rashes, lesions or ulcers Psychiatry: Judgement and insight appear normal. Mood & affect appropriate.   Data Reviewed: I have personally reviewed following labs and imaging studies  CBC: Recent Labs  Lab 04/17/17 1800 04/17/17 2249 04/18/17 0222 04/18/17 1437 04/19/17 0206  WBC 10.8* 10.3 13.5* 13.4* 11.8*  NEUTROABS 8.0*  --   --   --   --   HGB 9.0* 8.6* 8.5* 8.2* 8.4*  HCT 29.0* 27.2* 27.7* 25.8* 26.9*  MCV 96.3 97.1 96.2 94.5 96.4  PLT 349 355 382 349 474   Basic Metabolic Panel: Recent Labs  Lab 04/17/17 1800 04/17/17 2249 04/18/17 0222 04/18/17 0852 04/18/17 1437 04/19/17 0206  NA 141 140 143 138 138 137  K 5.8* 5.9* 6.9* 4.6 4.8 4.9  CL 97* 96* 99* 96* 98* 96*  CO2 20* 18* 17* 24 23 25   GLUCOSE 101* 91 97 91 92 87  BUN 74* 77* 82* 31* 36* 46*  CREATININE 15.52* 15.60* 16.02* 7.92* 8.32* 9.80*  CALCIUM 9.4 9.3 9.0 8.9 8.7* 8.7*  MG 3.0*  --  3.2*  --  2.5*  --   PHOS  --  8.4*  --   --  4.8*  --    GFR: Estimated Creatinine Clearance: 6.1 mL/min (A) (by C-G formula based on SCr of 9.8 mg/dL (H)). Liver Function Tests: Recent Labs  Lab  04/17/17 1800 04/17/17 2249 04/18/17 0222 04/18/17 1437 04/19/17 0206  AST 286*  --  531* 2,563* 1,965*  ALT 233*  --  469* 2,152* 2,349*  ALKPHOS 72  --  68 62 61  BILITOT 1.0  --  1.1 1.4* 1.5*  PROT 7.0  --  6.6 6.3* 5.9*  ALBUMIN 3.5 3.5 3.3* 3.1* 2.8*   No results for input(s): LIPASE, AMYLASE in the last 168 hours. No results for input(s): AMMONIA in the last 168 hours. Coagulation Profile: Recent Labs  Lab 04/18/17 0222  INR 1.77   Cardiac Enzymes: Recent Labs  Lab 04/17/17 1800 04/17/17 2249 04/18/17 0222 04/18/17 0852 04/18/17 1437  TROPONINI 0.22* 0.20* 0.22* 0.35* 0.39*   BNP (last 3 results) No results for input(s): PROBNP in the last 8760 hours. HbA1C: No results for input(s): HGBA1C in the last 72 hours. CBG: Recent Labs  Lab 04/18/17 0239  GLUCAP 79   Lipid Profile: No results for input(s): CHOL, HDL, LDLCALC, TRIG, CHOLHDL, LDLDIRECT in the last 72 hours. Thyroid Function Tests: Recent Labs    04/17/17 2249  TSH 1.747  FREET4 1.24*  T3FREE 1.5*   Anemia Panel: No results for input(s): VITAMINB12, FOLATE, FERRITIN, TIBC, IRON, RETICCTPCT in the last 72 hours. Sepsis Labs: Recent Labs  Lab 04/18/17 0228 04/18/17 0513  LATICACIDVEN 7.5* 1.0    Recent Results (from the past 240 hour(s))  MRSA PCR Screening     Status: None   Collection Time: 04/17/17 10:22 PM  Result Value Ref Range Status   MRSA by PCR NEGATIVE NEGATIVE Final    Comment:        The GeneXpert MRSA Assay (FDA approved for NASAL specimens only), is one component of a comprehensive MRSA colonization surveillance program. It is not intended to diagnose MRSA infection nor to guide or monitor treatment for MRSA infections. Performed at [facility], [place] [address], [place], [place] [address]        Radiology Studies: US Abdomen Complete  Result Date: 04/18/2017 CLINICAL DATA:  Elevated LFTs. EXAM: ABDOMEN ULTRASOUND COMPLETE COMPARISON:  Right upper  quadrant ultrasound 03/01/2017. CT angiography 03/01/2017. FINDINGS: Gallbladder: Partially contracted containing stones and sludge. There is diffuse gallbladder wall thickening of 7 mm. No sonographic [place] sign noted by sonographer. Common bile duct: Diameter: 5 mm, normal. Liver: No focal lesion identified. Within normal limits in parenchymal echogenicity. Portal vein is patent on color Doppler imaging with normal direction of blood flow towards the liver. IVC: No abnormality visualized. Pancreas: Visualized portion unremarkable. Spleen: Size and appearance within normal limits. Minimal perisplenic fluid. Right Kidney: Length: 9.3 cm. Cortical thinning with increased renal echogenicity. Cyst in the upper kidney measures 2.4 cm. No hydronephrosis. Left Kidney: Length: 9.4 cm. Cortical thinning with increased renal echogenicity. Multiple small cysts largest measuring 1.9 and 2.1 cm in the mid kidney. No hydronephrosis. Abdominal aorta: Known infrarenal abdominal aortic aneurysm is not well visualized. Majority of the abdominal aorta is obscured by bowel gas. Other findings: None. IMPRESSION: 1. Partially contracted gallbladder containing stones and sludge. Diffuse gallbladder wall thickening of 7 mm. Wall thickening may be in part due to nondistention. No sonographic [place] sign. If there is clinical concern for acute cholecystitis, consider nuclear medicine HIDA scan. 2. Unremarkable sonographic appearance of the liver. No biliary dilatation. 3. Small echogenic kidneys with bilateral renal cysts consistent with chronic medical renal disease. 4. Trace perisplenic fluid. 5. Known infrarenal abdominal aortic aneurysm is obscured by bowel gas. Electronically Signed   By: Jeb Levering M.D.   On: 04/18/2017 00:07   Dg Chest Port 1 View  Result Date: 04/19/2017 CLINICAL DATA:  Shortness of Breath EXAM: PORTABLE CHEST 1 VIEW COMPARISON:  April 18, 2017, April 17, 2017, and April 09, 2017 FINDINGS: There  is no edema or consolidation. There is cardiomegaly with pulmonary vascularity within normal limits. No adenopathy. No bone lesions. IMPRESSION: Stable cardiomegaly.  No appreciable edema or consolidation. Electronically Signed   By: Lowella Grip III M.D.   On: 04/19/2017 07:44   Dg Chest Port 1 View  Result Date: 04/18/2017 CLINICAL DATA:  Respiratory failure EXAM: PORTABLE CHEST 1 VIEW COMPARISON:  April 17, 2017 FINDINGS: Transcutaneous pacers overlie the medial left upper chest and the right hilar region. Stable cardiomegaly. The hila and mediastinum are unchanged. No pneumothorax. No nodules or masses. Mild atelectasis at the left base. No overt  edema. IMPRESSION: Cardiomegaly and left basilar atelectasis.  No overt edema. Electronically Signed   By: Dorise Bullion III M.D   On: 04/18/2017 14:04   Dg Chest Port 1 View  Result Date: 04/17/2017 CLINICAL DATA:  Dyspnea EXAM: PORTABLE CHEST 1 VIEW COMPARISON:  04/09/2017 FINDINGS: Stable cardiomegaly with aortic atherosclerosis. Interval development of mild interstitial edema. Pleural effusions however not apparent on current exam. No pneumonic consolidation. No acute osseous abnormality. IMPRESSION: 1. Stable cardiomegaly and aortic atherosclerosis. 2. Interval development of mild interstitial edema. Electronically Signed   By: Ashley Royalty M.D.   On: 04/17/2017 18:23      Scheduled Meds: . allopurinol  100 mg Oral Daily  . amLODipine  5 mg Oral QHS  . cholecalciferol  1,000 Units Oral Daily  . cinacalcet  30 mg Oral Q M,W,F-HD  . gabapentin  100 mg Oral QHS  . heparin  5,000 Units Subcutaneous Q8H  . lisinopril  10 mg Oral QHS  . sevelamer carbonate  2,400 mg Oral TID WC  . sodium chloride flush  3 mL Intravenous Q12H   Continuous Infusions: . sodium chloride    . sodium chloride       LOS: 2 days    Time spent: 30 minutes   Dessa Phi, DO [facility] [email or web address] Password [facility] 04/19/2017, 9:13 AM

## 2017-04-19 NOTE — Progress Notes (Signed)
Progress Note  Patient Name: Sean Best Date of Encounter: 04/19/2017  Primary Cardiologist: No primary care provider on file.   Subjective   Getting dialysis Still weak   Inpatient Medications    Scheduled Meds: . allopurinol  100 mg Oral Daily  . cholecalciferol  1,000 Units Oral Daily  . cinacalcet  30 mg Oral Q M,W,F-HD  . gabapentin  100 mg Oral QHS  . heparin  5,000 Units Subcutaneous Q8H  . sevelamer carbonate  2,400 mg Oral TID WC  . sodium chloride flush  3 mL Intravenous Q12H   Continuous Infusions: . sodium chloride    . sodium chloride     PRN Meds: sodium chloride, sodium chloride, acetaminophen **OR** acetaminophen, alteplase, guaiFENesin-dextromethorphan, HYDROcodone-acetaminophen, ipratropium-albuterol, lidocaine (PF), lidocaine-prilocaine, ondansetron **OR** ondansetron (ZOFRAN) IV, pentafluoroprop-tetrafluoroeth, polyethylene glycol, sevelamer carbonate   Vital Signs    Vitals:   04/18/17 1937 04/18/17 2000 04/19/17 0000 04/19/17 0500  BP: (!) 188/91 (!) 201/78 (!) 158/54   Pulse: (!) 56 (!) 31 (!) 43   Resp: 17 20 19    Temp: 98.7 F (37.1 C)     TempSrc: Oral     SpO2: 100% 96% 100%   Weight:    200 lb 9.9 oz (91 kg)  Height:        Intake/Output Summary (Last 24 hours) at 04/19/2017 0813 Last data filed at 04/18/2017 2000 Gross per 24 hour  Intake 100 ml  Output -  Net 100 ml   Filed Weights   04/18/17 0323 04/18/17 0723 04/19/17 0500  Weight: 209 lb 3.5 oz (94.9 kg) 200 lb 6.4 oz (90.9 kg) 200 lb 9.9 oz (91 kg)    Telemetry    CHB rates mid 40;s 04/19/2017 - Personally Reviewed  ECG    CHB  - Personally Reviewed  Physical Exam  Chronically ill black male  GEN: No acute distress.   Neck: No JVD Cardiac: RRR, AS  murmurs, rubs, or gallops.  Respiratory: Clear to auscultation bilaterally. GI: Soft, nontender, non-distended  MS: No edema; No deformity. Neuro:  Nonfocal  Psych: Normal affect  Fistular LUE   Labs      Chemistry Recent Labs  Lab 04/18/17 0222 04/18/17 0852 04/18/17 1437 04/19/17 0206  NA 143 138 138 137  K 6.9* 4.6 4.8 4.9  CL 99* 96* 98* 96*  CO2 17* 24 23 25   GLUCOSE 97 91 92 87  BUN 82* 31* 36* 46*  CREATININE 16.02* 7.92* 8.32* 9.80*  CALCIUM 9.0 8.9 8.7* 8.7*  PROT 6.6  --  6.3* 5.9*  ALBUMIN 3.3*  --  3.1* 2.8*  AST 531*  --  2,563* 1,965*  ALT 469*  --  2,152* 2,349*  ALKPHOS 68  --  62 61  BILITOT 1.1  --  1.4* 1.5*  GFRNONAA 2* 6* 5* 4*  GFRAA 3* 6* 6* 5*  ANIONGAP 27* 18* 17* 16*     Hematology Recent Labs  Lab 04/18/17 0222 04/18/17 1437 04/19/17 0206  WBC 13.5* 13.4* 11.8*  RBC 2.88* 2.73* 2.79*  HGB 8.5* 8.2* 8.4*  HCT 27.7* 25.8* 26.9*  MCV 96.2 94.5 96.4  MCH 29.5 30.0 30.1  MCHC 30.7 31.8 31.2  RDW 17.3* 17.2* 17.4*  PLT 382 349 312    Cardiac Enzymes Recent Labs  Lab 04/17/17 2249 04/18/17 0222 04/18/17 0852 04/18/17 1437  TROPONINI 0.20* 0.22* 0.35* 0.39*   No results for input(s): TROPIPOC in the last 168 hours.   BNP Recent Labs  Lab  04/17/17 1800  BNP 2,812.2*     DDimer  Recent Labs  Lab 04/18/17 0222  DDIMER 2.46*     Radiology    US Abdomen Complete  Result Date: 04/18/2017 CLINICAL DATA:  Elevated LFTs. EXAM: ABDOMEN ULTRASOUND COMPLETE COMPARISON:  Right upper quadrant ultrasound 03/01/2017. CT angiography 03/01/2017. FINDINGS: Gallbladder: Partially contracted containing stones and sludge. There is diffuse gallbladder wall thickening of 7 mm. No sonographic [place] sign noted by sonographer. Common bile duct: Diameter: 5 mm, normal. Liver: No focal lesion identified. Within normal limits in parenchymal echogenicity. Portal vein is patent on color Doppler imaging with normal direction of blood flow towards the liver. IVC: No abnormality visualized. Pancreas: Visualized portion unremarkable. Spleen: Size and appearance within normal limits. Minimal perisplenic fluid. Right Kidney: Length: 9.3 cm. Cortical thinning with  increased renal echogenicity. Cyst in the upper kidney measures 2.4 cm. No hydronephrosis. Left Kidney: Length: 9.4 cm. Cortical thinning with increased renal echogenicity. Multiple small cysts largest measuring 1.9 and 2.1 cm in the mid kidney. No hydronephrosis. Abdominal aorta: Known infrarenal abdominal aortic aneurysm is not well visualized. Majority of the abdominal aorta is obscured by bowel gas. Other findings: None. IMPRESSION: 1. Partially contracted gallbladder containing stones and sludge. Diffuse gallbladder wall thickening of 7 mm. Wall thickening may be in part due to nondistention. No sonographic [place] sign. If there is clinical concern for acute cholecystitis, consider nuclear medicine HIDA scan. 2. Unremarkable sonographic appearance of the liver. No biliary dilatation. 3. Small echogenic kidneys with bilateral renal cysts consistent with chronic medical renal disease. 4. Trace perisplenic fluid. 5. Known infrarenal abdominal aortic aneurysm is obscured by bowel gas. Electronically Signed   By: Jeb Levering M.D.   On: 04/18/2017 00:07   Dg Chest Port 1 View  Result Date: 04/19/2017 CLINICAL DATA:  Shortness of Breath EXAM: PORTABLE CHEST 1 VIEW COMPARISON:  April 18, 2017, April 17, 2017, and April 09, 2017 FINDINGS: There is no edema or consolidation. There is cardiomegaly with pulmonary vascularity within normal limits. No adenopathy. No bone lesions. IMPRESSION: Stable cardiomegaly.  No appreciable edema or consolidation. Electronically Signed   By: Lowella Grip III M.D.   On: 04/19/2017 07:44   Dg Chest Port 1 View  Result Date: 04/18/2017 CLINICAL DATA:  Respiratory failure EXAM: PORTABLE CHEST 1 VIEW COMPARISON:  April 17, 2017 FINDINGS: Transcutaneous pacers overlie the medial left upper chest and the right hilar region. Stable cardiomegaly. The hila and mediastinum are unchanged. No pneumothorax. No nodules or masses. Mild atelectasis at the left base. No overt  edema. IMPRESSION: Cardiomegaly and left basilar atelectasis.  No overt edema. Electronically Signed   By: Dorise Bullion III M.D   On: 04/18/2017 14:04   Dg Chest Port 1 View  Result Date: 04/17/2017 CLINICAL DATA:  Dyspnea EXAM: PORTABLE CHEST 1 VIEW COMPARISON:  04/09/2017 FINDINGS: Stable cardiomegaly with aortic atherosclerosis. Interval development of mild interstitial edema. Pleural effusions however not apparent on current exam. No pneumonic consolidation. No acute osseous abnormality. IMPRESSION: 1. Stable cardiomegaly and aortic atherosclerosis. 2. Interval development of mild interstitial edema. Electronically Signed   By: Ashley Royalty M.D.   On: 04/17/2017 18:23    Cardiac Studies   Echo 04/18/17 EF 65-70% moderate AS mean gradient 19 mmg peak 43 mmHg  Mild to moderate mR  Patient Profile     84 y.o. male with CRF on dialysis admitted with CHB and weakness Has not improved with normalization of K and d/c Toprol  Assessment &  Plan    1. CHB:  Persistent despite normalization of K and d/c Toprol Have asked Dr Caryl Comes and EP to see for PPM possibly in am 2. AS moderate by echo f/u TTE in 6 months  3. Troponin no chest pain no ischemic ECG changes normal EF by echo with no RWMA;s can risk stratify as outpatient with myovue  4. CRF: dialysis today per nephrology   For questions or updates, please contact [place] Please consult [email or web address] for contact info under Cardiology/STEMI.      Signed, Jenkins Rouge, MD  04/19/2017, 8:13 AM

## 2017-04-19 NOTE — Consult Note (Signed)
Cardiology Consultation:   Patient ID: Sean Best; [id number]; [birth year]   Admit date: 04/17/2017 Date of Consult: 04/19/2017  Primary Care Provider: Seward Carol, MD Primary Cardiologist: new to [facility], Dr. Debara Pickett Primary Electrophysiologist:  New to [facility], Dr. Caryl Comes today   Patient Profile:   Sean Best is a 84 y.o. male with a hx of ESRF/HD, HTN, HLD, CVA (x2), DM, who is being seen today for the evaluation of CHB at the request of Dr. Johnsie Cancel.  History of Present Illness:   Mr. [name] recently had been treated for productive cough, SOB treated for pneumonia a week or so ago out patient with reported clearing though developed progressive SOB, generalized malaise, and weakness, and in the 2 days prior to coming in [place] as well.  He was feeling ill, mentioned that some patient at the HD center were sick, and he went to HD Monday last week but not feeling well missed W and F.  He was noted to be fluid OL, required NIPPV and urgent bedside HD was done yesterday.  He was also noted to be bradycardic,cardiology consulted noting CHB with narrow escape rhythm, his home Toprol held (last dose Saturday),  Noted as well, to be hyperkalemic, metabolic acidosis.  Electrolytes are better, LFTs continue to rise felt to be secondary congestive hepatopathy / fluid overload / hypotension / hypoxia,  (though I do not appreciate noted marked hypotension since here  Home meds include Toprol XL 140m daily, last dose Friday AM (04/16/17), in d/w his daughter who manages his medicines, had no meds Saturday prior to coming in   LABS  K+ 5.8 >> 6.9 >>> 4.9  BUN/Creat 74/15.52 >>> 46/9.80 Mag 3.0 >> 2.5 AST 286 > 531 > 2563 > 1965 ALT 233 > 469 > 2152> 2349 WBC 10.8 > 13.5 > 11.8 H/H 9/29 >>> 8.4/26.9 Plts 349 TSH 1.74 Trop I: 0.22 (x2), 0.20, 0.22, 0.35, 0.39   Past Medical History:  Diagnosis Date  . Anemia   . Arthritis    "knees" (04/11/2016)  . Blind left eye    S/P trauma  . Chronic total  occlusion of artery of the extremities (HCC)    pt not aware of this  . [place]   . ESRD on dialysis ([facility]    "MWF; [facility] ((04/10/2016)  . ESRD on peritoneal dialysis ([facility]    Started dialysis around April 2015 per son.  Has been doing peritoneal dialysis at home.     . Gout   . Hyperlipidemia   . Hypertension   . Overweight(278.02)   . [place]  . Shingles   . Stroke ([facility] ~1998   denies residual on 04/11/2016  . Type II diabetes mellitus ([place]     Past Surgical History:  Procedure Laterality Date  . AV FISTULA PLACEMENT, RADIOCEPHALIC  [id number]  Left arm  . CAPD INSERTION N/A 03/06/2013   Procedure: LAPAROSCOPIC INSERTION CONTINUOUS AMBULATORY PERITONEAL DIALYSIS  (CAPD) CATHETER;  Surgeon: BEdward Jolly MD;  [place]  Service: General;  Laterality: N/A;  . CATARACT EXTRACTION Right   . EYE SURGERY Left 1970s   for eye injury  . HERNIA REPAIR    . INSERTION PROSTATE RADIATION SEED    . UMBILICAL HERNIA REPAIR N/A 03/06/2013   Procedure: HERNIA REPAIR UMBILICAL WITH MESH;  Surgeon: BEdward Jolly MD;  Location: [facility] OR;  Service: General;  Laterality: N/A;     Inpatient Medications: Scheduled Meds: . allopurinol  100 mg Oral  Daily  . amLODipine  5 mg Oral QHS  . cholecalciferol  1,000 Units Oral Daily  . cinacalcet  30 mg Oral Q M,W,F-HD  . gabapentin  100 mg Oral QHS  . heparin  5,000 Units Subcutaneous Q8H  . lisinopril  10 mg Oral QHS  . sevelamer carbonate  2,400 mg Oral TID WC  . sodium chloride flush  3 mL Intravenous Q12H   Continuous Infusions: . sodium chloride    . sodium chloride     PRN Meds: sodium chloride, sodium chloride, acetaminophen **OR** acetaminophen, alteplase, guaiFENesin-dextromethorphan, HYDROcodone-acetaminophen, ipratropium-albuterol, lidocaine (PF), lidocaine-prilocaine, ondansetron **OR** ondansetron (ZOFRAN) IV, pentafluoroprop-tetrafluoroeth, polyethylene glycol, sevelamer  carbonate  Allergies:    Allergies  Allergen Reactions  . Cardura [Doxazosin Mesylate] Other (See Comments)    Hallucinations    Social History:   Social History   Socioeconomic History  . Marital status: Widowed    Spouse name: Not on file  . Number of children: Not on file  . Years of education: Not on file  . Highest education level: Not on file  Social Needs  . Financial resource strain: Not on file  . Food insecurity - worry: Not on file  . Food insecurity - inability: Not on file  . Transportation needs - medical: Not on file  . Transportation needs - non-medical: Not on file  Occupational History  . Not on file  Tobacco Use  . Smoking status: Never Smoker  . Smokeless tobacco: Never Used  Substance and Sexual Activity  . Alcohol use: No  . Drug use: No  . Sexual activity: No  Other Topics Concern  . Not on file  Social History Narrative  . Not on file    Family History:   Family History  Problem Relation Age of Onset  . Cancer Mother   . Heart disease Mother   . Cancer Father      ROS:  Please see the history of present illness.  All other ROS reviewed and negative.     Physical Exam/Data:   Vitals:   04/19/17 0800 04/19/17 0830 04/19/17 0900 04/19/17 0930  BP: (!) 158/79 (!) 159/70 (!) 157/66 139/80  Pulse: (!) 45 (!) 44 (!) 46 (!) 47  Resp: _0 Temp:      TempSrc:      SpO2:      Weight:      Height:        Intake/Output Summary (Last 24 hours) at 04/19/2017 0956 Last data filed at 04/18/2017 2000 Gross per 24 hour  Intake 100 ml  Output -  Net 100 ml   Filed Weights   04/18/17 0723 04/19/17 0500 04/19/17 0715  Weight: 200 lb 6.4 oz (90.9 kg) 200 lb 9.9 oz (91 kg) 197 lb 15.6 oz (89.8 kg)   Body mass index is 32.94 kg/m.  General:  Well nourished, well developed, in no acute distress HEENT: normal Lymph: no adenopathy Neck: no JVD Endocrine:  No thryomegaly Vascular: No carotid bruits  Cardiac:   RRR; bradycardic, 1/6  SM, no gallops or rubs Lungs:  CTA b/l, no wheezing, rhonchi or rales  Abd: soft, nontender, no hepatomegaly  Ext: no edema LE b/l, LUE forearm AVF Musculoskeletal:  No deformities, age appropriate atrophy Skin: warm and dry  Neuro:   No ggross focal abnormalities noted Psych:  Normal affect   EKG:  The EKG was personally reviewed and demonstrates:   2:1 conduction w/markled 1st degree AVBlock, V rate 39bpm,  QRS 171m 04/11/16 is SR 71bpm, PR 1756m QRS 9637mQTc 83m82mlemetry:  Telemetry was personally reviewed and demonstrates:   CHB, 40's Rarely conducted beats  Relevant CV Studies:  04/18/17: TTE Study Conclusions - Left ventricle: The cavity size was normal. There was severe   concentric hypertrophy. Systolic function was vigorous. The   estimated ejection fraction was in the range of 65% to 70%. Wall   motion was normal; there were no regional wall motion   abnormalities. The study is not technically sufficient to allow   evaluation of LV diastolic function. - Aortic valve: Calcified with moderate stenosis. There was trivial   regurgitation. Mean gradient (S): 19 mm Hg. Peak gradient (S): 43   mm Hg. Valve area (VTI): 1.25 cm^2. Valve area (Vmax): 1.1 cm^2.   Valve area (Vmean): 1.22 cm^2. - Mitral valve: Thickened, sclerotic leaflets. MAC. Mild to   moderate regurgitation. - Left atrium: Severely dilated. - Right ventricle: The cavity size was mildly dilated. Systolic   function was normal. - Right atrium: Severely dilated. - Tricuspid valve: There was moderate regurgitation. - Pulmonary arteries: PA peak pressure: 72 mm Hg (S). - Inferior vena cava: The vessel was normal in size. The   respirophasic diameter changes were in the normal range (>= 50%),   consistent with normal central venous pressure. Impressions: - Compared to a prior study in 04/2016, the LVEF is higher at   65-70%. There is severe LVH and now moderate aortic stenosis with   a mean gradient of 19  mmHg (up from 10 mmHg). Severe biatrial   enlargement is noted with mild to moderate MR, moderate TR and   moderate to severe pulmonary hypertension and RVSP of 72 mmHg   with a normal sized IVC.  Laboratory Data:  Chemistry Recent Labs  Lab 04/18/17 0852 04/18/17 1437 04/19/17 0206  NA 138 138 137  K 4.6 4.8 4.9  CL 96* 98* 96*  CO2 _0 GLUCOSE 91 92 87  BUN 31* 36* 46*  CREATININE 7.92* 8.32* 9.80*  CALCIUM 8.9 8.7* 8.7*  GFRNONAA 6* 5* 4*  GFRAA 6* 6* 5*  ANIONGAP 18* 17* 16*    Recent Labs  Lab 04/18/17 0222 04/18/17 1437 04/19/17 0206  PROT 6.6 6.3* 5.9*  ALBUMIN 3.3* 3.1* 2.8*  AST 531* 2,563* 1,965*  ALT 469* 2,152* 2,349*  ALKPHOS 68 62 61  BILITOT 1.1 1.4* 1.5*   Hematology Recent Labs  Lab 04/18/17 0222 04/18/17 1437 04/19/17 0206  WBC 13.5* 13.4* 11.8*  RBC 2.88* 2.73* 2.79*  HGB 8.5* 8.2* 8.4*  HCT 27.7* 25.8* 26.9*  MCV 96.2 94.5 96.4  MCH 29.5 30.0 30.1  MCHC 30.7 31.8 31.2  RDW 17.3* 17.2* 17.4*  PLT 382 349 312   Cardiac Enzymes Recent Labs  Lab 04/17/17 1800 04/17/17 2249 04/18/17 0222 04/18/17 0852 04/18/17 1437  TROPONINI 0.22* 0.20* 0.22* 0.35* 0.39*   No results for input(s): TROPIPOC in the last 168 hours.  BNP Recent Labs  Lab 04/17/17 1800  BNP 2,812.2*    DDimer  Recent Labs  Lab 04/18/17 0222  DDIMER 2.46*    Radiology/Studies:  [place] Complete Result Date: 04/18/2017 CLINICAL DATA:  Elevated LFTs. EXAM: ABDOMEN ULTRASOUND COMPLETE COMPARISON:  Right upper quadrant ultrasound 03/01/2017. CT angiography 03/01/2017. FINDINGS: Gallbladder: Partially contracted containing stones and sludge. There is diffuse gallbladder wall thickening of 7 mm. No sonographic Talcott sign noted by sonographer. Common bile duct: Diameter: 5 mm, normal. Liver: No focal  lesion identified. Within normal limits in parenchymal echogenicity. Portal vein is patent on color Doppler imaging with normal direction of blood flow towards  the liver. IVC: No abnormality visualized. Pancreas: Visualized portion unremarkable. Spleen: Size and appearance within normal limits. Minimal perisplenic fluid. Right Kidney: Length: 9.3 cm. Cortical thinning with increased renal echogenicity. Cyst in the upper kidney measures 2.4 cm. No hydronephrosis. Left Kidney: Length: 9.4 cm. Cortical thinning with increased renal echogenicity. Multiple small cysts largest measuring 1.9 and 2.1 cm in the mid kidney. No hydronephrosis. Abdominal aorta: Known infrarenal abdominal aortic aneurysm is not well visualized. Majority of the abdominal aorta is obscured by bowel gas. Other findings: None. IMPRESSION: 1. Partially contracted gallbladder containing stones and sludge. Diffuse gallbladder wall thickening of 7 mm. Wall thickening may be in part due to nondistention. No sonographic Heinlen sign. If there is clinical concern for acute cholecystitis, consider nuclear medicine HIDA scan. 2. Unremarkable sonographic appearance of the liver. No biliary dilatation. 3. Small echogenic kidneys with bilateral renal cysts consistent with chronic medical renal disease. 4. Trace perisplenic fluid. 5. Known infrarenal abdominal aortic aneurysm is obscured by bowel gas. Electronically Signed   By: Jeb Levering M.D.   On: 04/18/2017 00:07   Dg Chest Port 1 View Result Date: 04/19/2017 CLINICAL DATA:  Shortness of Breath EXAM: PORTABLE CHEST 1 VIEW COMPARISON:  April 18, 2017, April 17, 2017, and April 09, 2017 FINDINGS: There is no edema or consolidation. There is cardiomegaly with pulmonary vascularity within normal limits. No adenopathy. No bone lesions. IMPRESSION: Stable cardiomegaly.  No appreciable edema or consolidation. Electronically Signed   By: Lowella Grip III M.D.   On: 04/19/2017 07:44     Assessment and Plan:   1. High degree AVBlock, CHB     No rate limiting, nodal blocking drugs here     last dose home Toprol was 04/16/17 AM     1/2 life  3-7hours >> 7-9 hours with hepatic impairment  Medicine should be cleared, he remains in CHB He will need pacing I have discussed with the patient, he is agreeable though would like to discuss with Dr. Caryl Comes further, and have his daughter involved in the discussion as well. His daughter will be coming on soon.  After further discussion with Dr. Caryl Comes, the patient is hesitant, would like the opportunity to discuss with his daughter.  His BP is stable, at rest he is asymptomatic Will move him to tomorrow's schedule.  We have asked that when the daughter arrives to call EP service to discuss further  2. Respiratory insufficiency     Acute CHF     Missed HD (x2 sessions)     Continue Fluid management with HD  3. Severe LVH, preserved LVEF      (concentric)      BP control  4. Mild abnormal Trop     Difficult to interpret with ESRF, no c/o CP     No inpatient ischemic w/u planned    For questions or updates, please contact [place] Please consult [email or web address] for contact info under Cardiology/STEMI.    Signed, Baldwin Jamaica, PA-C  04/19/2017 9:56 AM   Complete heart block with narrow QRS escape  Left ventricular hypertrophy  Mitral annular calcification  End-stage renal disease on hemodialysis  CVA-prior  Hypertension  Hepatitis Patient presented with complete heart block having been on metoprolol succinate.  His half-life, about 9 hours in the context of liver disease, should now be long gone.  I  wonder if annular calcification in the context of his renal disease is not the likely mechanism.  In any case it is unlikely that he will recover conduction at this late time following discontinuation of his beta-blocker.  While rarely amlodipine is associated with it, this is a new drug on admission.  Its half-life is sufficiently long that it is unlikely contributing.  We have reviewed that I think pacing is indicated.  He is quite concerned about this as he has had 2  family members with pacing who did not do well.  He would like [place] to review it with his daughter.  We will plan to meet later this afternoon and anticipate pacing tomorrow if he is agreeable  The I am not sure as to the mechanism of his hepatitis.  There seems to be an acute insult; with his gallbladder issues I wonder if this is that.  There are not signs of systemic illness but I will reach out to GI as transient bacteremia can follow choledocholithiasis.

## 2017-04-19 NOTE — Plan of Care (Signed)
Patient is progressing toward all care goals; significant improvements in cognition, respiratory status and renal function since admission.  Patient continues to be in SB/Heart Block - cardiology to manage.  Patient had HD again this AM without complication and states he feels much better.  Will continue to monitor.

## 2017-04-19 NOTE — Plan of Care (Signed)
Reviewed HD trmt orders and treatment planned.  Patient verbalized understanding.

## 2017-04-20 ENCOUNTER — Encounter (HOSPITAL_COMMUNITY): Payer: Self-pay | Admitting: Internal Medicine

## 2017-04-20 ENCOUNTER — Encounter (HOSPITAL_COMMUNITY)
Admission: EM | Disposition: A | Discharge status: Home Health | Payer: Self-pay | Source: Home / Self Care | Attending: Internal Medicine

## 2017-04-20 HISTORY — PX: PACEMAKER IMPLANT: EP1218

## 2017-04-20 LAB — CBC
HEMATOCRIT: 28.6 % — AB (ref 39.0–52.0)
HEMOGLOBIN: 8.9 g/dL — AB (ref 13.0–17.0)
MCH: 30.2 pg (ref 26.0–34.0)
MCHC: 31.1 g/dL (ref 30.0–36.0)
MCV: 96.9 fL (ref 78.0–100.0)
Platelets: 358 10*3/uL (ref 150–400)
RBC: 2.95 MIL/uL — ABNORMAL LOW (ref 4.22–5.81)
RDW: 17.3 % — AB (ref 11.5–15.5)
WBC: 10.7 10*3/uL — AB (ref 4.0–10.5)

## 2017-04-20 LAB — COMPREHENSIVE METABOLIC PANEL
ALBUMIN: 2.9 g/dL — AB (ref 3.5–5.0)
ALK PHOS: 68 U/L (ref 38–126)
ALT: 1571 U/L — ABNORMAL HIGH (ref 17–63)
AST: 665 U/L — AB (ref 15–41)
Anion gap: 15 (ref 5–15)
BILIRUBIN TOTAL: 1.4 mg/dL — AB (ref 0.3–1.2)
BUN: 36 mg/dL — AB (ref 6–20)
CALCIUM: 8.7 mg/dL — AB (ref 8.9–10.3)
CO2: 27 mmol/L (ref 22–32)
CREATININE: 7.53 mg/dL — AB (ref 0.61–1.24)
Chloride: 94 mmol/L — ABNORMAL LOW (ref 101–111)
GFR calc Af Amer: 7 mL/min — ABNORMAL LOW (ref >60)
GFR, EST NON AFRICAN AMERICAN: 6 mL/min — AB (ref >60)
GLUCOSE: 89 mg/dL (ref 65–99)
Potassium: 3.5 mmol/L (ref 3.5–5.1)
Sodium: 136 mmol/L (ref 135–145)
TOTAL PROTEIN: 6.1 g/dL — AB (ref 6.5–8.1)

## 2017-04-20 LAB — SURGICAL PCR SCREEN
MRSA, PCR: NEGATIVE
Staphylococcus aureus: NEGATIVE

## 2017-04-20 SURGERY — PACEMAKER IMPLANT

## 2017-04-20 MED ORDER — HEPARIN SODIUM (PORCINE) 1000 UNIT/ML DIALYSIS: 1000.0000 [IU] | INTRAMUSCULAR | Status: DC | PRN

## 2017-04-20 MED ORDER — AMLODIPINE BESYLATE 5 MG PO TABS: 5.0000 mg | ORAL_TABLET | Freq: Every day | ORAL | Status: DC

## 2017-04-20 MED ORDER — CEFAZOLIN SODIUM-DEXTROSE 1-4 GM/50ML-% IV SOLN
1.0000 g | Freq: Once | INTRAVENOUS | Status: AC
  Administered 2017-04-21: 1 g via INTRAVENOUS
  Filled 2017-04-20: qty 50

## 2017-04-20 MED ORDER — MIDAZOLAM HCL 5 MG/5ML IJ SOLN
INTRAMUSCULAR | Status: DC | PRN
  Administered 2017-04-20 (×2): 1 mg via INTRAVENOUS

## 2017-04-20 MED ORDER — LIDOCAINE HCL (PF) 1 % IJ SOLN
INTRAMUSCULAR | Status: AC
  Filled 2017-04-20: qty 30

## 2017-04-20 MED ORDER — CHLORHEXIDINE GLUCONATE 4 % EX LIQD
CUTANEOUS | Status: AC
  Filled 2017-04-20: qty 15

## 2017-04-20 MED ORDER — LIDOCAINE HCL (PF) 1 % IJ SOLN
INTRAMUSCULAR | Status: DC | PRN
  Administered 2017-04-20: 60 mL

## 2017-04-20 MED ORDER — HEPARIN (PORCINE) IN NACL 2-0.9 UNIT/ML-% IJ SOLN
INTRAMUSCULAR | Status: AC | PRN
  Administered 2017-04-20: 500 mL

## 2017-04-20 MED ORDER — FENTANYL CITRATE (PF) 100 MCG/2ML IJ SOLN
INTRAMUSCULAR | Status: DC | PRN
  Administered 2017-04-20: 12.5 ug via INTRAVENOUS

## 2017-04-20 MED ORDER — HEPARIN (PORCINE) IN NACL 2-0.9 UNIT/ML-% IJ SOLN
INTRAMUSCULAR | Status: AC
  Filled 2017-04-20: qty 500

## 2017-04-20 MED ORDER — SODIUM CHLORIDE 0.9 % IV SOLN: 100.0000 mL | INTRAVENOUS | Status: DC | PRN

## 2017-04-20 MED ORDER — ONDANSETRON HCL 4 MG/2ML IJ SOLN: 4.0000 mg | Freq: Four times a day (QID) | INTRAMUSCULAR | Status: DC | PRN

## 2017-04-20 MED ORDER — CEFAZOLIN SODIUM-DEXTROSE 2-4 GM/100ML-% IV SOLN
INTRAVENOUS | Status: AC
  Filled 2017-04-20: qty 100

## 2017-04-20 MED ORDER — SODIUM CHLORIDE 0.9 % IR SOLN
Status: AC
  Filled 2017-04-20: qty 2

## 2017-04-20 MED ORDER — MIDAZOLAM HCL 5 MG/5ML IJ SOLN
INTRAMUSCULAR | Status: AC
  Filled 2017-04-20: qty 5

## 2017-04-20 MED ORDER — FENTANYL CITRATE (PF) 100 MCG/2ML IJ SOLN
INTRAMUSCULAR | Status: AC
  Filled 2017-04-20: qty 2

## 2017-04-20 MED ORDER — ACETAMINOPHEN 325 MG PO TABS: 325.0000 mg | ORAL_TABLET | ORAL | Status: DC | PRN

## 2017-04-20 MED ORDER — HEPARIN SODIUM (PORCINE) 1000 UNIT/ML DIALYSIS: 20.0000 [IU]/kg | INTRAMUSCULAR | Status: DC | PRN

## 2017-04-20 SURGICAL SUPPLY — 12 items
CABLE SURGICAL S-101-97-12 (CABLE) ×3 IMPLANT
CATH RIGHTSITE C315HIS02 (CATHETERS) ×3 IMPLANT
IPG PACE AZUR XT DR MRI W1DR01 (Pacemaker) ×1 IMPLANT
LEAD CAPSURE NOVUS 45CM (Lead) ×3 IMPLANT
LEAD SELECT SECURE 3830 383069 (Lead) ×1 IMPLANT
PACE AZURE XT DR MRI W1DR01 (Pacemaker) ×3 IMPLANT
PAD DEFIB LIFELINK (PAD) ×3 IMPLANT
SELECT SECURE 3830 383069 (Lead) ×3 IMPLANT
SHEATH CLASSIC 7F (SHEATH) ×6 IMPLANT
SLITTER 6232ADJ (MISCELLANEOUS) ×3 IMPLANT
TRAY PACEMAKER INSERTION (PACKS) ×3 IMPLANT
WIRE HI TORQ VERSACORE-J 145CM (WIRE) ×3 IMPLANT

## 2017-04-20 NOTE — Consult Note (Signed)
[facility] CONSULT Reason for consult: abnormal liver test Referring Physician: [facility] hospitalist. PCP: Dr. Maudry Mayhew.  Sean Best is an 84 y.o. male.  HPI: patient has ESRD and has been on hemodialysis with possible plan to convert peritoneal dialysis. He's had a prior history of C. difficile. He presented to his PCP about 10 days ago with productive cough dizziness and was started on amoxicillin. He had previously been given Zpac by the renal service at dialysis for similar symptoms. He was subsequently changed amoxicillin. He was short of breath and coughing and was unable to make it to dialysis. He ended up coming to the hospital and was in florid pulmonary edema. He had to have emergency dialysis in the hospital. He was found to be in complete heart block and has been seen by cardiology electrophysiology. He is due to have a pacemaker inserted today. Patients LFTs were slightly elevated on admission with transaminases 250 - 300 range with other LFTs okay. It is notable that the patient has had previous LFTs done during a physical exam with normal LFTs noted. His LFTs in the hospital became markedly elevated with transaminases up in the thousands. They and slowly improved with treatment of his pulmonary edema and congestive heart failure. The patient has never had any known liver trouble. He denies prior history of hepatitis, blood transfusion. This wife is deceased. He was married for over 86 years into the best of his knowledge she never had a blood transfusion or any history of hepatitis. Is chronic health problems include diabetes, ESRD on hemodialysis, osteoarthritis, history of CVA in the past. He has had a previous history of prostate cancer has had seed implants by [facility]  Past Medical History:  Diagnosis Date  . Anemia   . Arthritis    "knees" (04/11/2016)  . Blind left eye    S/P trauma  . Chronic total occlusion of artery of the extremities (HCC)    pt not aware of  this  . Claudication ([place])   . ESRD on dialysis [facility])    "MWF; [name]" ((04/10/2016)  . ESRD on peritoneal dialysis [facility])    Started dialysis around April 2015 per son.  Has been doing peritoneal dialysis at home.     . Gout   . Hyperlipidemia   . Hypertension   . Overweight(278.02)   . Prostate cancer ([place]) 1990s  . Shingles   . Stroke [facility]) ~1998   denies residual on 04/11/2016  . Type II diabetes mellitus ([place])     Past Surgical History:  Procedure Laterality Date  . AV FISTULA PLACEMENT, RADIOCEPHALIC  63/03/6008   Left arm  . CAPD INSERTION N/A 03/06/2013   Procedure: LAPAROSCOPIC INSERTION CONTINUOUS AMBULATORY PERITONEAL DIALYSIS  (CAPD) CATHETER;  Surgeon: Edward Jolly, MD;  Location: [facility];  Service: General;  Laterality: N/A;  . CATARACT EXTRACTION Right   . EYE SURGERY Left 1970s   for eye injury  . HERNIA REPAIR    . INSERTION PROSTATE RADIATION SEED    . UMBILICAL HERNIA REPAIR N/A 03/06/2013   Procedure: HERNIA REPAIR UMBILICAL WITH MESH;  Surgeon: Edward Jolly, MD;  Location: [facility];  Service: General;  Laterality: N/A;    Family History  Problem Relation Age of Onset  . Cancer Mother   . Heart disease Mother   . Cancer Father     Social History:  reports that  has never smoked. he has never used smokeless tobacco. He reports that he does not drink  alcohol or use drugs.  Allergies:  Allergies  Allergen Reactions  . Cardura [Doxazosin Mesylate] Other (See Comments)    Hallucinations    Medications; Prior to Admission medications   Medication Sig Start Date End Date Taking? Authorizing Provider  acetaminophen (TYLENOL) 500 MG tablet Take 1,000 mg by mouth daily as needed for headache.   Yes [provider]  allopurinol (ZYLOPRIM) 100 MG tablet Take 100 mg by mouth daily.     Yes [provider]  amLODipine (NORVASC) 5 MG tablet Take 5 mg by mouth at bedtime. 01/04/17  Yes [provider]  amoxicillin-clavulanate  (AUGMENTIN) 500-125 MG tablet Take 1 tablet by mouth every 12 (twelve) hours. 10 day course started 04/09/17 04/09/17  Yes [provider]  atorvastatin (LIPITOR) 40 MG tablet Take 40 mg by mouth daily.     Yes [provider]  Cholecalciferol (VITAMIN D-3) 5000 units TABS Take 10,000 Units by mouth daily.   Yes [provider]  gabapentin (NEURONTIN) 100 MG capsule Take 100 mg by mouth at bedtime. 12/27/16  Yes [provider]  ibuprofen (ADVIL,MOTRIN) 200 MG tablet Take 800 mg by mouth daily as needed for headache (pain).   Yes [provider]  lisinopril (PRINIVIL,ZESTRIL) 10 MG tablet Take 10 mg by mouth at bedtime. 03/25/17  Yes [provider]  loperamide (IMODIUM A-D) 2 MG tablet Take 4 mg by mouth 4 (four) times daily as needed for diarrhea or loose stools.   Yes [provider]  metoprolol succinate (TOPROL-XL) 100 MG 24 hr tablet Take 100 mg by mouth at bedtime. 02/24/17  Yes [provider]  multivitamin (RENA-VIT) TABS tablet Take 1 tablet by mouth daily.   Yes [provider]  promethazine-dextromethorphan (PROMETHAZINE-DM) 6.25-15 MG/5ML syrup Take 5 mLs by mouth every 6 (six) hours as needed for cough.   Yes [provider]  SENSIPAR 30 MG tablet Take 30 mg by mouth every Monday, Wednesday, and Friday with hemodialysis.  12/12/14  Yes [provider]  sevelamer carbonate (RENVELA) 800 MG tablet Take 1,600-2,400 mg by mouth See admin instructions. Take 3 tablets (2400 mg) by mouth 3 times daily with meals and 2 tablets (1600 mg)  with snacks   Yes [provider]  diphenoxylate-atropine (LOMOTIL) 2.5-0.025 MG per tablet TAKE 1 TABLET BY MOUTH EVERY 4 TO 6 HOURS AS NEEDED Patient not taking: Reported on 04/17/2017 10/09/14   Zehr, Janett Billow D, PA-C  ondansetron (ZOFRAN ODT) 8 MG disintegrating tablet Take 1 tablet (8 mg total) by mouth every 8 (eight) hours as needed for nausea or  vomiting. Patient not taking: Reported on 04/17/2017 03/01/17   Jola Schmidt, MD   . allopurinol  100 mg Oral Daily  . amLODipine  5 mg Oral QHS  . cholecalciferol  1,000 Units Oral Daily  . cinacalcet  30 mg Oral Q M,W,F-HD  . gabapentin  100 mg Oral QHS  . heparin  5,000 Units Subcutaneous Q8H  . lisinopril  10 mg Oral QHS  . sevelamer carbonate  2,400 mg Oral TID WC  . sodium chloride flush  3 mL Intravenous Q12H   PRN Meds sodium chloride, sodium chloride, guaiFENesin-dextromethorphan, heparin, heparin, ipratropium-albuterol, ondansetron **OR** ondansetron (ZOFRAN) IV, polyethylene glycol, sevelamer carbonate, traMADol Results for orders placed or performed during the hospital encounter of 04/17/17 (from the past 48 hour(s))  Hepatic function panel     Status: Abnormal   Collection Time: 04/18/17  2:37 PM  Result Value Ref Range  Total Protein 6.3 (L) 6.5 - 8.1 g/dL   Albumin 3.1 (L) 3.5 - 5.0 g/dL   AST 2,563 (H) 15 - 41 U/L    Comment: RESULTS CONFIRMED BY MANUAL DILUTION   ALT 2,152 (H) 17 - 63 U/L   Alkaline Phosphatase 62 38 - 126 U/L   Total Bilirubin 1.4 (H) 0.3 - 1.2 mg/dL   Bilirubin, Direct 0.2 0.1 - 0.5 mg/dL   Indirect Bilirubin 1.2 (H) 0.3 - 0.9 mg/dL    Comment: Performed at [facility] [address] [place], [place] [address]  CBC     Status: Abnormal   Collection Time: 04/18/17  2:37 PM  Result Value Ref Range   WBC 13.4 (H) 4.0 - 10.5 K/uL   RBC 2.73 (L) 4.22 - 5.81 MIL/uL   Hemoglobin 8.2 (L) 13.0 - 17.0 g/dL   HCT 25.8 (L) 39.0 - 52.0 %   MCV 94.5 78.0 - 100.0 fL   MCH 30.0 26.0 - 34.0 pg   MCHC 31.8 30.0 - 36.0 g/dL   RDW 17.2 (H) 11.5 - 15.5 %   Platelets 349 150 - 400 K/uL    Comment: Performed at [facility] [address], [place], [place] [address]  Basic metabolic panel     Status: Abnormal   Collection Time: 04/18/17  2:37 PM  Result Value Ref Range   Sodium 138 135 - 145 mmol/L   Potassium 4.8 3.5 - 5.1 mmol/L    Chloride 98 (L) 101 - 111 mmol/L   CO2 23 22 - 32 mmol/L   Glucose, Bld 92 65 - 99 mg/dL   BUN 36 (H) 6 - 20 mg/dL   Creatinine, Ser 8.32 (H) 0.61 - 1.24 mg/dL   Calcium 8.7 (L) 8.9 - 10.3 mg/dL   GFR calc non Af Amer 5 (L) >60 mL/min   GFR calc Af Amer 6 (L) >60 mL/min    Comment: (NOTE) The eGFR has been calculated using the CKD EPI equation. This calculation has not been validated in all clinical situations. eGFR's persistently <60 mL/min signify possible Chronic Kidney Disease.    Anion gap 17 (H) 5 - 15    Comment: Performed at [facility], [place] [address] [place], [place] [address]  Magnesium     Status: Abnormal   Collection Time: 04/18/17  2:37 PM  Result Value Ref Range   Magnesium 2.5 (H) 1.7 - 2.4 mg/dL    Comment: Performed at [facility] [address] [place], [place] [address]  Phosphorus     Status: Abnormal   Collection Time: 04/18/17  2:37 PM  Result Value Ref Range   Phosphorus 4.8 (H) 2.5 - 4.6 mg/dL    Comment: Performed at [facility] [address], [place], [place] [address]  Troponin I     Status: Abnormal   Collection Time: 04/18/17  2:37 PM  Result Value Ref Range   Troponin I 0.39 (HH) <0.03 ng/mL    Comment: CRITICAL VALUE NOTED.  VALUE IS CONSISTENT WITH PREVIOUSLY REPORTED AND CALLED VALUE. Performed at [facility], [name] [address] [place] [address]   CBC     Status: Abnormal   Collection Time: 04/19/17  2:06 AM  Result Value Ref Range   WBC 11.8 (H) 4.0 - 10.5 K/uL   RBC 2.79 (L) 4.22 - 5.81 MIL/uL   Hemoglobin 8.4 (L) 13.0 - 17.0 g/dL   HCT 26.9 (L) 39.0 -  52.0 %   MCV 96.4 78.0 - 100.0 fL   MCH 30.1 26.0 - 34.0 pg   MCHC 31.2 30.0 - 36.0 g/dL   RDW 17.4 (H) 11.5 - 15.5 %   Platelets 312 150 - 400 K/uL    Comment: Performed at [facility] [address], [place], [place] [address]  Comprehensive metabolic panel     Status: Abnormal   Collection Time: 04/19/17  2:06 AM  Result Value Ref  Range   Sodium 137 135 - 145 mmol/L   Potassium 4.9 3.5 - 5.1 mmol/L   Chloride 96 (L) 101 - 111 mmol/L   CO2 25 22 - 32 mmol/L   Glucose, Bld 87 65 - 99 mg/dL   BUN 46 (H) 6 - 20 mg/dL   Creatinine, Ser 9.80 (H) 0.61 - 1.24 mg/dL   Calcium 8.7 (L) 8.9 - 10.3 mg/dL   Total Protein 5.9 (L) 6.5 - 8.1 g/dL   Albumin 2.8 (L) 3.5 - 5.0 g/dL   AST 1,965 (H) 15 - 41 U/L   ALT 2,349 (H) 17 - 63 U/L    Comment: RESULTS CONFIRMED BY MANUAL DILUTION   Alkaline Phosphatase 61 38 - 126 U/L   Total Bilirubin 1.5 (H) 0.3 - 1.2 mg/dL   GFR calc non Af Amer 4 (L) >60 mL/min   GFR calc Af Amer 5 (L) >60 mL/min    Comment: (NOTE) The eGFR has been calculated using the CKD EPI equation. This calculation has not been validated in all clinical situations. eGFR's persistently <60 mL/min signify possible Chronic Kidney Disease.    Anion gap 16 (H) 5 - 15    Comment: Performed at [facility], [employer] [address] [place], [place] [address]  Surgical PCR screen     Status: None   Collection Time: 04/19/17  6:38 AM  Result Value Ref Range   MRSA, PCR NEGATIVE NEGATIVE   Staphylococcus aureus NEGATIVE NEGATIVE    Comment: (NOTE) The Xpert SA Assay (FDA approved for NASAL specimens in patients 44 years of age and older), is one component of a comprehensive surveillance program. It is not intended to diagnose infection nor to guide or monitor treatment. Performed at [facility], [place] [address], [place], [place] [address]   CBC     Status: Abnormal   Collection Time: 04/20/17  2:36 AM  Result Value Ref Range   WBC 10.7 (H) 4.0 - 10.5 K/uL   RBC 2.95 (L) 4.22 - 5.81 MIL/uL   Hemoglobin 8.9 (L) 13.0 - 17.0 g/dL   HCT 28.6 (L) 39.0 - 52.0 %   MCV 96.9 78.0 - 100.0 fL   MCH 30.2 26.0 - 34.0 pg   MCHC 31.1 30.0 - 36.0 g/dL   RDW 17.3 (H) 11.5 - 15.5 %   Platelets 358 150 - 400 K/uL    Comment: Performed at [facility], [place] [address], [place], [place] [address]  Comprehensive  metabolic panel     Status: Abnormal   Collection Time: 04/20/17  2:36 AM  Result Value Ref Range   Sodium 136 135 - 145 mmol/L   Potassium 3.5 3.5 - 5.1 mmol/L   Chloride 94 (L) 101 - 111 mmol/L   CO2 27 22 - 32 mmol/L   Glucose, Bld 89 65 - 99 mg/dL   BUN 36 (H) 6 - 20 mg/dL   Creatinine, Ser 7.53 (H) 0.61 - 1.24 mg/dL   Calcium 8.7 (L) 8.9 -  10.3 mg/dL   Total Protein 6.1 (L) 6.5 - 8.1 g/dL   Albumin 2.9 (L) 3.5 - 5.0 g/dL   AST 665 (H) 15 - 41 U/L   ALT 1,571 (H) 17 - 63 U/L   Alkaline Phosphatase 68 38 - 126 U/L   Total Bilirubin 1.4 (H) 0.3 - 1.2 mg/dL   GFR calc non Af Amer 6 (L) >60 mL/min   GFR calc Af Amer 7 (L) >60 mL/min    Comment: (NOTE) The eGFR has been calculated using the CKD EPI equation. This calculation has not been validated in all clinical situations. eGFR's persistently <60 mL/min signify possible Chronic Kidney Disease.    Anion gap 15 5 - 15    Comment: Performed at [facility] [address] [place], [place] [address]    Dg Chest Port 1 View  Result Date: 04/19/2017 CLINICAL DATA:  Shortness of Breath EXAM: PORTABLE CHEST 1 VIEW COMPARISON:  April 18, 2017, April 17, 2017, and April 09, 2017 FINDINGS: There is no edema or consolidation. There is cardiomegaly with pulmonary vascularity within normal limits. No adenopathy. No bone lesions. IMPRESSION: Stable cardiomegaly.  No appreciable edema or consolidation. Electronically Signed   By: Lowella Grip III M.D.   On: 04/19/2017 07:44   Dg Chest Port 1 View  Result Date: 04/18/2017 CLINICAL DATA:  Respiratory failure EXAM: PORTABLE CHEST 1 VIEW COMPARISON:  April 17, 2017 FINDINGS: Transcutaneous pacers overlie the medial left upper chest and the right hilar region. Stable cardiomegaly. The hila and mediastinum are unchanged. No pneumothorax. No nodules or masses. Mild atelectasis at the left base. No overt edema. IMPRESSION: Cardiomegaly and left basilar atelectasis.  No overt edema.  Electronically Signed   By: Dorise Bullion III M.D   On: 04/18/2017 14:04               Blood pressure 139/68, pulse 80, temperature 98 F (36.7 C), temperature source Oral, resp. rate 15, height _0  (1.651 m), weight 86.5 kg (190 lb 11.2 oz), SpO2 95 %.  Physical exam:   General-- pleasant African-American male and no distress ENT-- nonicteric Neck-- the lymphadenopathy Heart-- slightly irregular normal rate Lungs-- clear Abdomen-- on distended and soft nontender. No ascites. Unable to palpate deliver the right upper quadrant. Psych-- alert and oriented answers questions appropriately   Assessment: 1. Abnormal liver test. This is likely due to passive congestion/shock liver due to his heart failure and pulmonary edema. He seems to be recovering. There no risk factors for hepatitis. He only knew medications that he has been given our azithromycin and amoxicillin I doubt either one of these is caused medication associated hepatitis. 2. ESRD on hemodialysis 3. Diabetes 4. Prostate cancer status post seed implants. 5. Heart block. Patient due to have pacemaker inserted today  Plan: 1. Will go ahead and obtain an acute hepatitis profile on hepatitis C antibody. Would continue to monitor this transaminases daily. We will follow tool is clear that his liver test are improving.   Nancy Fetter 04/20/2017, 12:49 PM   This note was created using voice recognition software and minor errors may Have occurred unintentionally. Pager: [phone] If no answer or after hours call [phone]

## 2017-04-20 NOTE — Progress Notes (Signed)
PROGRESS NOTE    Sean Best  PJK:[id number] DOB: 03/31/1933 DOA: 04/17/2017 PCP: Seward Carol, MD     Brief Narrative:  Sean Best is a 84 yo male with medical history significant of end-stage renal disease on Monday Wednesday Friday dialysis, hyperlipidemia, hypertension, aortic aneurysm, history of stroke, type 2 diabetes who comes in with shortness of breath.  Patient reports that last Friday he began to have cough, change in sputum production, and significant myalgias and chills.  He was seen by his PCP approximately 1 week ago and diagnosed with a possible pneumonia and given Augmentin.  Despite antibiotics, he continued to have profound fatigue, weakness and hence was unable to make his dialysis sessions on Wednesday and Friday.  He reports that his sputum cleared up and he just has small amounts of clear white sputum now.  He began to develop progressive shortness of breath, lower extremity edema, orthopnea, paroxysmal nocturnal dyspnea.  In the ED, his heart rate was in the 30-40s with complete heart block. Labs revealed K 5.8 with elevated LFT, BNP > 2000. Cardiology and Nephrology was consulted. Due to continued respiratory failure, he was placed on BiPAP and PCCM consulted. He underwent urgent HD on early morning 2/17 and weaned off BiPAP. He had a brief pre-syncopal episode on afternoon of 2/17. His heart rate remained low with AV block. EP was consulted and plan for permanent pacemaker placement.   Assessment & Plan:   Principal Problem:   Acute hypoxemic respiratory failure (HCC) Active Problems:   Anemia of chronic disease   DM (diabetes mellitus), type 2 with renal complications, diet controlled   Benign hypertension   Hyperlipidemia   ESRD on hemodialysis (HCC)   Heart block   Fluid overload   Elevated troponin  Acute hypoxemic respiratory failure -Due to fluid overload in the setting of missing dialysis -PCCM consulted and now signed off as patient has successfully  weaned off BiPAP  -Nephrology consulted, patient underwent HD 2/17 early AM. Repeat CXR without pulmonary edema -Restrict fluids, weight daily, I/Os  -On room air this morning   Third-degree heart block -Cardiology consulted -Held beta blocker since admission  -EP planning for PPM placement today   Elevated LFTs -In setting of congestive hepatopathy / fluid overload / hypotension / hypoxia  -Right upper quadrant ultrasound revealed partially contracted gallbladder containing stones and sludge. Diffuse gallbladder wall thickening of 7 mm. Wall thickening may be in part due to nondistention.  -He has no complaints of RUQ pain. Direct bilirubin is normal at 0.2  -Hepatitis panel negative -Continue to trend LFT, trending downward   Elevated troponin -Demand ischemia in setting of above -Cardiolgy consulted  -Echo with no wall motion abnormalities. Follow up as outpatient with [facility]  -No chest pain this morning   Lactic acidosis -Resolved   Hyperkalemia -Resolved after HD   ESRD on HD MWF  -Per Nephrology  Hypertension -Hold metoprolol in setting of bradycardia -Continue amlodipine, lisinopril  Hyperlipidemia -Hold lipitor in setting of elevated LFT   Chronic pain -Continue gabapentin     DVT prophylaxis: subq hep Code Status: full Family Communication: daughter at bedside  Disposition Plan: pacemaker placement planned for today    Consultants:   Cardiology  Nephrology  EP  Procedures:   None   Antimicrobials:  Anti-infectives (From admission, onward)   Start     Dose/Rate Route Frequency Ordered Stop   04/20/17 0600  gentamicin (GARAMYCIN) 80 mg in sodium chloride irrigation 0.9 % 500 mL irrigation  80 mg Irrigation To Surgery 04/19/17 2033 04/21/17 0600   04/20/17 0600  ceFAZolin (ANCEF) IVPB 2g/100 mL premix     2 g 200 mL/hr over 30 Minutes Intravenous To Surgery 04/19/17 2033 04/21/17 0600       Subjective: Patient without  complaints this morning. He states he is ready for procedure planned for today. He denies chest pain, shortness of breath, dizziness, abdominal pain, nausea, vomiting. He is feeling well.   Objective: Vitals:   04/19/17 2306 04/20/17 0409 04/20/17 0500 04/20/17 0713  BP: (!) 147/72 (!) 154/62    Pulse: (!) 46 (!) 39    Resp: (!) 22 14    Temp: 98.7 F (37.1 C) 97.8 F (36.6 C)  98 F (36.7 C)  TempSrc: Oral Oral  Oral  SpO2: 98% 96%    Weight:   86.5 kg (190 lb 11.2 oz)   Height:        Intake/Output Summary (Last 24 hours) at 04/20/2017 0949 Last data filed at 04/20/2017 0708 Gross per 24 hour  Intake 153.83 ml  Output 3075 ml  Net -2921.17 ml   Filed Weights   04/19/17 0715 04/19/17 1045 04/20/17 0500  Weight: 89.8 kg (197 lb 15.6 oz) 86.6 kg (190 lb 14.7 oz) 86.5 kg (190 lb 11.2 oz)    Examination: General exam: Appears calm and comfortable  Respiratory system: Clear to auscultation. Respiratory effort normal. On room air  Cardiovascular system: S1 & S2 heard, bradycardic rate 40s. No JVD, murmurs, rubs, gallops or clicks. No pedal edema. Gastrointestinal system: Abdomen is nondistended, soft and nontender. No organomegaly or masses felt. Normal bowel sounds heard. Central nervous system: Alert and oriented. No focal neurological deficits. Extremities: Symmetric 5 x 5 power. Skin: No rashes, lesions or ulcers Psychiatry: Judgement and insight appear normal. Mood & affect appropriate.    Data Reviewed: I have personally reviewed following labs and imaging studies  CBC: Recent Labs  Lab 04/17/17 1800 04/17/17 2249 04/18/17 0222 04/18/17 1437 04/19/17 0206 04/20/17 0236  WBC 10.8* 10.3 13.5* 13.4* 11.8* 10.7*  NEUTROABS 8.0*  --   --   --   --   --   HGB 9.0* 8.6* 8.5* 8.2* 8.4* 8.9*  HCT 29.0* 27.2* 27.7* 25.8* 26.9* 28.6*  MCV 96.3 97.1 96.2 94.5 96.4 96.9  PLT 349 355 382 349 312 973   Basic Metabolic Panel: Recent Labs  Lab 04/17/17 1800 04/17/17 2249  04/18/17 0222 04/18/17 0852 04/18/17 1437 04/19/17 0206 04/20/17 0236  NA 141 140 143 138 138 137 136  K 5.8* 5.9* 6.9* 4.6 4.8 4.9 3.5  CL 97* 96* 99* 96* 98* 96* 94*  CO2 20* 18* 17* 24 23 25 27   GLUCOSE 101* 91 97 91 92 87 89  BUN 74* 77* 82* 31* 36* 46* 36*  CREATININE 15.52* 15.60* 16.02* 7.92* 8.32* 9.80* 7.53*  CALCIUM 9.4 9.3 9.0 8.9 8.7* 8.7* 8.7*  MG 3.0*  --  3.2*  --  2.5*  --   --   PHOS  --  8.4*  --   --  4.8*  --   --    GFR: Estimated Creatinine Clearance: 7.8 mL/min (A) (by C-G formula based on SCr of 7.53 mg/dL (H)). Liver Function Tests: Recent Labs  Lab 04/17/17 1800 04/17/17 2249 04/18/17 0222 04/18/17 1437 04/19/17 0206 04/20/17 0236  AST 286*  --  531* 2,563* 1,965* 665*  ALT 233*  --  469* 2,152* 2,349* 1,571*  ALKPHOS 72  --  68 62 61 68  BILITOT 1.0  --  1.1 1.4* 1.5* 1.4*  PROT 7.0  --  6.6 6.3* 5.9* 6.1*  ALBUMIN 3.5 3.5 3.3* 3.1* 2.8* 2.9*   No results for input(s): LIPASE, AMYLASE in the last 168 hours. No results for input(s): AMMONIA in the last 168 hours. Coagulation Profile: Recent Labs  Lab 04/18/17 0222  INR 1.77   Cardiac Enzymes: Recent Labs  Lab 04/17/17 1800 04/17/17 2249 04/18/17 0222 04/18/17 0852 04/18/17 1437  TROPONINI 0.22* 0.20* 0.22* 0.35* 0.39*   BNP (last 3 results) No results for input(s): PROBNP in the last 8760 hours. HbA1C: No results for input(s): HGBA1C in the last 72 hours. CBG: Recent Labs  Lab 04/18/17 0239  GLUCAP 79   Lipid Profile: No results for input(s): CHOL, HDL, LDLCALC, TRIG, CHOLHDL, LDLDIRECT in the last 72 hours. Thyroid Function Tests: Recent Labs    04/17/17 2249  TSH 1.747  FREET4 1.24*  T3FREE 1.5*   Anemia Panel: No results for input(s): VITAMINB12, FOLATE, FERRITIN, TIBC, IRON, RETICCTPCT in the last 72 hours. Sepsis Labs: Recent Labs  Lab 04/18/17 0228 04/18/17 0513  LATICACIDVEN 7.5* 1.0    Recent Results (from the past 240 hour(s))  MRSA PCR Screening      Status: None   Collection Time: 04/17/17 10:22 PM  Result Value Ref Range Status   MRSA by PCR NEGATIVE NEGATIVE Final    Comment:        The GeneXpert MRSA Assay (FDA approved for NASAL specimens only), is one component of a comprehensive MRSA colonization surveillance program. It is not intended to diagnose MRSA infection nor to guide or monitor treatment for MRSA infections. Performed at [facility], [place] [address], [place], [place] [address]   Surgical PCR screen     Status: None   Collection Time: 04/19/17  6:38 AM  Result Value Ref Range Status   MRSA, PCR NEGATIVE NEGATIVE Final   Staphylococcus aureus NEGATIVE NEGATIVE Final    Comment: (NOTE) The Xpert SA Assay (FDA approved for NASAL specimens in patients 70 years of age and older), is one component of a comprehensive surveillance program. It is not intended to diagnose infection nor to guide or monitor treatment. Performed at [facility], [place] [address], [place], [place] [address]        Radiology Studies: Dg Chest Port 1 View  Result Date: 04/19/2017 CLINICAL DATA:  Shortness of Breath EXAM: PORTABLE CHEST 1 VIEW COMPARISON:  April 18, 2017, April 17, 2017, and April 09, 2017 FINDINGS: There is no edema or consolidation. There is cardiomegaly with pulmonary vascularity within normal limits. No adenopathy. No bone lesions. IMPRESSION: Stable cardiomegaly.  No appreciable edema or consolidation. Electronically Signed   By: Lowella Grip III M.D.   On: 04/19/2017 07:44   Dg Chest Port 1 View  Result Date: 04/18/2017 CLINICAL DATA:  Respiratory failure EXAM: PORTABLE CHEST 1 VIEW COMPARISON:  April 17, 2017 FINDINGS: Transcutaneous pacers overlie the medial left upper chest and the right hilar region. Stable cardiomegaly. The hila and mediastinum are unchanged. No pneumothorax. No nodules or masses. Mild atelectasis at the left base. No overt edema. IMPRESSION: Cardiomegaly and left  basilar atelectasis.  No overt edema. Electronically Signed   By: Dorise Bullion III M.D   On: 04/18/2017 14:04      Scheduled Meds: . allopurinol  100 mg Oral Daily  . amLODipine  5 mg Oral QHS  . cholecalciferol  1,000 Units  Oral Daily  . cinacalcet  30 mg Oral Q M,W,F-HD  . gabapentin  100 mg Oral QHS  . gentamicin irrigation  80 mg Irrigation To OR  . heparin  5,000 Units Subcutaneous Q8H  . lisinopril  10 mg Oral QHS  . sevelamer carbonate  2,400 mg Oral TID WC  . sodium chloride flush  3 mL Intravenous Q12H   Continuous Infusions: . sodium chloride    . sodium chloride    . sodium chloride    . sodium chloride 50 mL/hr at 04/20/17 0523  .  ceFAZolin (ANCEF) IV       LOS: 3 days    Time spent: 30 minutes   Dessa Phi, DO [facility] [email or web address] Password [facility] 04/20/2017, 9:49 AM

## 2017-04-20 NOTE — Care Management (Signed)
Pt has active insurance - medication assistance can not be provided based on PTA and current in hospital meds.  CM will continue to follow for discharge needs

## 2017-04-20 NOTE — Progress Notes (Signed)
[facility] Progress Note   Subjective: stable , had PPM placed this am on R side   Objective Vitals:   04/20/17 1151 04/20/17 1156 04/20/17 1159 04/20/17 1320  BP: 125/61 139/68    Pulse: 80 80    Resp: 19 15    Temp:    97.8 F (36.6 C)  TempSrc:    Axillary  SpO2: 99% 100% 95%   Weight:      Height:       Physical Exam General: WNWD male, no distress Heart: Bradycardic .no mrg Lungs: clear bilat, R upper chest PPM w dressing Abdomen: soft NT  Extremities: no edema Dialysis Access: LUE AVF +bruit   Dialysis: [facility] MWF 4h  2/ bath  94.5kg   Hep std   LUE AVF  400/800   Assessment: 1. Bradycardia/ CHB - now is sp PPM placement today 2/19 2. Hyperkalemia - resolved 3. ESRD - HD MWF. HD tomorrow  4. HTN/ vol - well under dry wt, lower at dc. BP's ok, back on home meds   5. Anemia ckd - Hb 8- 9 , will give esa tomorrow darbe 60 ug 6. Metabolic bone disease -   On binders, sensipar, and vitamin D,, Renvela 7. Nutrition -  Renal diet   P -  HD Gretta Arab MD [facility] pgr [phone]   04/20/2017, 1:44 PM      Additional Objective Labs: Basic Metabolic Panel: Recent Labs  Lab 04/17/17 2249  04/18/17 1437 04/19/17 0206 04/20/17 0236  NA 140   < > 138 137 136  K 5.9*   < > 4.8 4.9 3.5  CL 96*   < > 98* 96* 94*  CO2 18*   < > 23 25 27   GLUCOSE 91   < > 92 87 89  BUN 77*   < > 36* 46* 36*  CREATININE 15.60*   < > 8.32* 9.80* 7.53*  CALCIUM 9.3   < > 8.7* 8.7* 8.7*  PHOS 8.4*  --  4.8*  --   --    < > = values in this interval not displayed.   CBC: Recent Labs  Lab 04/17/17 1800 04/17/17 2249 04/18/17 0222 04/18/17 1437 04/19/17 0206 04/20/17 0236  WBC 10.8* 10.3 13.5* 13.4* 11.8* 10.7*  NEUTROABS 8.0*  --   --   --   --   --   HGB 9.0* 8.6* 8.5* 8.2* 8.4* 8.9*  HCT 29.0* 27.2* 27.7* 25.8* 26.9* 28.6*  MCV 96.3 97.1 96.2 94.5 96.4 96.9  PLT 349 355 382 349 312 358   Blood Culture    Component  Value Date/Time   SDES BLOOD RIGHT HAND 12/28/2014 1725   SPECREQUEST BOTTLES DRAWN AEROBIC ONLY 6CC 12/28/2014 1725   CULT NO GROWTH 5 DAYS 12/28/2014 1725   REPTSTATUS 01/02/2015 FINAL 12/28/2014 1725    Cardiac Enzymes: Recent Labs  Lab 04/17/17 1800 04/17/17 2249 04/18/17 0222 04/18/17 0852 04/18/17 1437  TROPONINI 0.22* 0.20* 0.22* 0.35* 0.39*   CBG: Recent Labs  Lab 04/18/17 0239  GLUCAP 79   Iron Studies: No results for input(s): IRON, TIBC, TRANSFERRIN, FERRITIN in the last 72 hours. Lab Results  Component Value Date   INR 1.77 04/18/2017   INR 1.39 12/29/2014   INR 1.25 12/28/2014   Medications: . sodium chloride    . sodium chloride    . [START ON 04/21/2017]  ceFAZolin (ANCEF) IV     . allopurinol  100 mg Oral  Daily  . amLODipine  5 mg Oral QHS  . cholecalciferol  1,000 Units Oral Daily  . cinacalcet  30 mg Oral Q M,W,F-HD  . gabapentin  100 mg Oral QHS  . heparin  5,000 Units Subcutaneous Q8H  . lisinopril  10 mg Oral QHS  . sevelamer carbonate  2,400 mg Oral TID WC  . sodium chloride flush  3 mL Intravenous Q12H

## 2017-04-20 NOTE — Progress Notes (Signed)
Progress Note  Patient Name: Sean Best Date of Encounter: 04/20/2017  Primary Cardiologist: Hilty   Subjective   "I feel better"  Inpatient Medications    Scheduled Meds: . allopurinol  100 mg Oral Daily  . amLODipine  5 mg Oral QHS  . cholecalciferol  1,000 Units Oral Daily  . cinacalcet  30 mg Oral Q M,W,F-HD  . gabapentin  100 mg Oral QHS  . gentamicin irrigation  80 mg Irrigation To OR  . heparin  5,000 Units Subcutaneous Q8H  . lisinopril  10 mg Oral QHS  . sevelamer carbonate  2,400 mg Oral TID WC  . sodium chloride flush  3 mL Intravenous Q12H   Continuous Infusions: . sodium chloride    . sodium chloride    . sodium chloride    . sodium chloride 50 mL/hr at 04/20/17 0523  .  ceFAZolin (ANCEF) IV     PRN Meds: sodium chloride, sodium chloride, guaiFENesin-dextromethorphan, heparin, heparin, ipratropium-albuterol, ondansetron **OR** ondansetron (ZOFRAN) IV, polyethylene glycol, sevelamer carbonate, traMADol   Vital Signs    Vitals:   04/19/17 2306 04/20/17 0409 04/20/17 0500 04/20/17 0713  BP: (!) 147/72 (!) 154/62    Pulse: (!) 46 (!) 39    Resp: (!) 22 14    Temp: 98.7 F (37.1 C) 97.8 F (36.6 C)  98 F (36.7 C)  TempSrc: Oral Oral  Oral  SpO2: 98% 96%    Weight:   190 lb 11.2 oz (86.5 kg)   Height:        Intake/Output Summary (Last 24 hours) at 04/20/2017 0830 Last data filed at 04/20/2017 0708 Gross per 24 hour  Intake 153.83 ml  Output 3075 ml  Net -2921.17 ml   Filed Weights   04/19/17 0715 04/19/17 1045 04/20/17 0500  Weight: 197 lb 15.6 oz (89.8 kg) 190 lb 14.7 oz (86.6 kg) 190 lb 11.2 oz (86.5 kg)    Telemetry    nsr with 2:1 AV block - Personally Reviewed  ECG    NSR with 2:1 AV block - Personally Reviewed  Physical Exam   GEN: No acute distress.   Neck: 7 cm JVD Cardiac: Reg brady, no murmurs, rubs, or gallops.  Respiratory: Clear to auscultation bilaterally. GI: Soft, nontender, non-distended  MS: No edema; No  deformity. Left arm fistula Neuro:  Nonfocal  Psych: Normal affect   Labs    Chemistry Recent Labs  Lab 04/18/17 1437 04/19/17 0206 04/20/17 0236  NA 138 137 136  K 4.8 4.9 3.5  CL 98* 96* 94*  CO2 23 25 27   GLUCOSE 92 87 89  BUN 36* 46* 36*  CREATININE 8.32* 9.80* 7.53*  CALCIUM 8.7* 8.7* 8.7*  PROT 6.3* 5.9* 6.1*  ALBUMIN 3.1* 2.8* 2.9*  AST 2,563* 1,965* 665*  ALT 2,152* 2,349* 1,571*  ALKPHOS 62 61 68  BILITOT 1.4* 1.5* 1.4*  GFRNONAA 5* 4* 6*  GFRAA 6* 5* 7*  ANIONGAP 17* 16* 15     Hematology Recent Labs  Lab 04/18/17 1437 04/19/17 0206 04/20/17 0236  WBC 13.4* 11.8* 10.7*  RBC 2.73* 2.79* 2.95*  HGB 8.2* 8.4* 8.9*  HCT 25.8* 26.9* 28.6*  MCV 94.5 96.4 96.9  MCH 30.0 30.1 30.2  MCHC 31.8 31.2 31.1  RDW 17.2* 17.4* 17.3*  PLT 349 312 358    Cardiac Enzymes Recent Labs  Lab 04/17/17 2249 04/18/17 0222 04/18/17 0852 04/18/17 1437  TROPONINI 0.20* 0.22* 0.35* 0.39*   No results for input(s): TROPIPOC in the  last 168 hours.   BNP Recent Labs  Lab 04/17/17 1800  BNP 2,812.2*     DDimer  Recent Labs  Lab 04/18/17 0222  DDIMER 2.46*     Radiology    Dg Chest Port 1 View  Result Date: 04/19/2017 CLINICAL DATA:  Shortness of Breath EXAM: PORTABLE CHEST 1 VIEW COMPARISON:  April 18, 2017, April 17, 2017, and April 09, 2017 FINDINGS: There is no edema or consolidation. There is cardiomegaly with pulmonary vascularity within normal limits. No adenopathy. No bone lesions. IMPRESSION: Stable cardiomegaly.  No appreciable edema or consolidation. Electronically Signed   By: Lowella Grip III M.D.   On: 04/19/2017 07:44   Dg Chest Port 1 View  Result Date: 04/18/2017 CLINICAL DATA:  Respiratory failure EXAM: PORTABLE CHEST 1 VIEW COMPARISON:  April 17, 2017 FINDINGS: Transcutaneous pacers overlie the medial left upper chest and the right hilar region. Stable cardiomegaly. The hila and mediastinum are unchanged. No pneumothorax. No  nodules or masses. Mild atelectasis at the left base. No overt edema. IMPRESSION: Cardiomegaly and left basilar atelectasis.  No overt edema. Electronically Signed   By: Dorise Bullion III M.D   On: 04/18/2017 14:04    Cardiac Studies   2D echo - normal LV function  Patient Profile     84 y.o. male admitted with cough/sob/fever/ and found to have 2:1 AV block. His beta blocker were allowed to washout and conduction did not improve. Elevated LFT's likely due to shock liver  Assessment & Plan    1. Symptomatic 2:1 AV block - will plan for PPM later today. Will discuss issues of fistula and best site for device with renal service 2. Elevated LFT's - his enzymes are improving. I suspect this is due to shock liver.  For questions or updates, please contact [place] Please consult [email or web address] for contact info under Cardiology/STEMI.      Signed, Cristopher Peru, MD  04/20/2017, 8:30 AM  Patient ID: Sean Best, male   DOB: [birth year], 84 y.o.   MRN: [id number]

## 2017-04-21 ENCOUNTER — Inpatient Hospital Stay (HOSPITAL_COMMUNITY): Payer: Medicare Other

## 2017-04-21 DIAGNOSIS — I10 Essential (primary) hypertension: Secondary | ICD-10-CM

## 2017-04-21 DIAGNOSIS — J810 Acute pulmonary edema: Secondary | ICD-10-CM

## 2017-04-21 LAB — COMPREHENSIVE METABOLIC PANEL
ALK PHOS: 63 U/L (ref 38–126)
ALT: 896 U/L — AB (ref 17–63)
AST: 379 U/L — AB (ref 15–41)
Albumin: 3 g/dL — ABNORMAL LOW (ref 3.5–5.0)
Anion gap: 17 — ABNORMAL HIGH (ref 5–15)
BUN: 49 mg/dL — AB (ref 6–20)
CALCIUM: 8.9 mg/dL (ref 8.9–10.3)
CO2: 27 mmol/L (ref 22–32)
CREATININE: 9.91 mg/dL — AB (ref 0.61–1.24)
Chloride: 94 mmol/L — ABNORMAL LOW (ref 101–111)
GFR calc Af Amer: 5 mL/min — ABNORMAL LOW (ref >60)
GFR, EST NON AFRICAN AMERICAN: 4 mL/min — AB (ref >60)
Glucose, Bld: 86 mg/dL (ref 65–99)
Potassium: 3.7 mmol/L (ref 3.5–5.1)
Sodium: 138 mmol/L (ref 135–145)
Total Bilirubin: 0.8 mg/dL (ref 0.3–1.2)
Total Protein: 6.1 g/dL — ABNORMAL LOW (ref 6.5–8.1)

## 2017-04-21 LAB — CBC
HCT: 29.1 % — ABNORMAL LOW (ref 39.0–52.0)
HEMOGLOBIN: 9.1 g/dL — AB (ref 13.0–17.0)
MCH: 30.3 pg (ref 26.0–34.0)
MCHC: 31.3 g/dL (ref 30.0–36.0)
MCV: 97 fL (ref 78.0–100.0)
PLATELETS: 340 10*3/uL (ref 150–400)
RBC: 3 MIL/uL — AB (ref 4.22–5.81)
RDW: 17.5 % — ABNORMAL HIGH (ref 11.5–15.5)
WBC: 8 10*3/uL (ref 4.0–10.5)

## 2017-04-21 MED ORDER — DARBEPOETIN ALFA 60 MCG/0.3ML IJ SOSY: 60.0000 ug | PREFILLED_SYRINGE | INTRAMUSCULAR | Status: DC

## 2017-04-21 MED ORDER — DARBEPOETIN ALFA 40 MCG/0.4ML IJ SOSY
PREFILLED_SYRINGE | INTRAMUSCULAR | Status: AC
  Filled 2017-04-21: qty 0.4

## 2017-04-21 MED ORDER — HEPARIN SODIUM (PORCINE) 1000 UNIT/ML DIALYSIS
100.0000 [IU]/kg | Freq: Once | INTRAMUSCULAR | Status: DC
  Filled 2017-04-21: qty 9

## 2017-04-21 MED ORDER — DARBEPOETIN ALFA 40 MCG/0.4ML IJ SOSY
40.0000 ug | PREFILLED_SYRINGE | INTRAMUSCULAR | Status: DC
  Administered 2017-04-21: 40 ug via INTRAVENOUS
  Filled 2017-04-21: qty 0.4

## 2017-04-21 NOTE — Care Management Important Message (Signed)
Important Message  Patient Details  Name: Sean Best MRN: [id number] Date of Birth: 25-Apr-1933   Medicare Important Message Given:  Yes  Patient was away a signed copy of the patient Medicare rights was left in the patient room [name] 04/21/2017, 1:17 PM

## 2017-04-21 NOTE — Progress Notes (Addendum)
Progress Note  Patient Name: Sean Best Date of Encounter: 04/21/2017  Primary Cardiologist: new to [facility] this admission, Dr. Debara Pickett  Subjective   Denies any implant site pain, no SOB, no CP or palpitations  Inpatient Medications    Scheduled Meds: . allopurinol  100 mg Oral Daily  . amLODipine  5 mg Oral QHS  . cholecalciferol  1,000 Units Oral Daily  . cinacalcet  30 mg Oral Q M,W,F-HD  . darbepoetin (ARANESP) injection - DIALYSIS  40 mcg Intravenous Q Wed-HD  . gabapentin  100 mg Oral QHS  . heparin  5,000 Units Subcutaneous Q8H  . [START ON 04/22/2017] heparin  100 Units/kg Dialysis Once in dialysis  . lisinopril  10 mg Oral QHS  . sevelamer carbonate  2,400 mg Oral TID WC  . sodium chloride flush  3 mL Intravenous Q12H   Continuous Infusions: . sodium chloride    . sodium chloride    .  ceFAZolin (ANCEF) IV     PRN Meds: sodium chloride, sodium chloride, guaiFENesin-dextromethorphan, heparin, heparin, ipratropium-albuterol, ondansetron **OR** ondansetron (ZOFRAN) IV, polyethylene glycol, sevelamer carbonate, traMADol   Vital Signs    Vitals:   04/21/17 1130 04/21/17 1200 04/21/17 1230 04/21/17 1236  BP: (!) 144/59 (!) 115/56 (!) 133/36 (!) 106/35  Pulse: 88 88 89 88  Resp: _0 Temp:    98.2 F (36.8 C)  TempSrc:    Oral  SpO2:    99%  Weight:    189 lb 13.1 oz (86.1 kg)  Height:        Intake/Output Summary (Last 24 hours) at 04/21/2017 1502 Last data filed at 04/21/2017 1236 Gross per 24 hour  Intake 240 ml  Output 2000 ml  Net -1760 ml   Filed Weights   04/21/17 0335 04/21/17 0830 04/21/17 1236  Weight: 193 lb 5.5 oz (87.7 kg) 194 lb 7.1 oz (88.2 kg) 189 lb 13.1 oz (86.1 kg)    Telemetry    SR/Vpaced - Personally Reviewed  ECG    SR/Vpaced - Personally Reviewed  Physical Exam   GEN: No acute distress.   Neck: No JVD Cardiac: RRR, no murmurs, rubs, or gallops.  Respiratory: CTA b/l. GI: Soft, nontender, non-distended  MS: No  edema; No deformity. Neuro:  Nonfocal  Psych: Normal affect   R chest/PPM site: dressing is dry, no hematoma, bleeding  Labs    Chemistry Recent Labs  Lab 04/19/17 0206 04/20/17 0236 04/21/17 0253  NA 137 136 138  K 4.9 3.5 3.7  CL 96* 94* 94*  CO2 _1 GLUCOSE 87 89 86  BUN 46* 36* 49*  CREATININE 9.80* 7.53* 9.91*  CALCIUM 8.7* 8.7* 8.9  PROT 5.9* 6.1* 6.1*  ALBUMIN 2.8* 2.9* 3.0*  AST 1,965* 665* 379*  ALT 2,349* 1,571* 896*  ALKPHOS 61 68 63  BILITOT 1.5* 1.4* 0.8  GFRNONAA 4* 6* 4*  GFRAA 5* 7* 5*  ANIONGAP 16* 15 17*     Hematology Recent Labs  Lab 04/19/17 0206 04/20/17 0236 04/21/17 0253  WBC 11.8* 10.7* 8.0  RBC 2.79* 2.95* 3.00*  HGB 8.4* 8.9* 9.1*  HCT 26.9* 28.6* 29.1*  MCV 96.4 96.9 97.0  MCH 30.1 30.2 30.3  MCHC 31.2 31.1 31.3  RDW 17.4* 17.3* 17.5*  PLT 312 358 340    Cardiac Enzymes Recent Labs  Lab 04/17/17 2249 04/18/17 0222 04/18/17 0852 04/18/17 1437  TROPONINI 0.20* 0.22* 0.35* 0.39*   No results for input(s): TROPIPOC  in the last 168 hours.   BNP Recent Labs  Lab 04/17/17 1800  BNP 2,812.2*     DDimer  Recent Labs  Lab 04/18/17 0222  DDIMER 2.46*     Radiology    Dg Chest 2 View Result Date: 04/21/2017 CLINICAL DATA:  Status post pacer placement EXAM: CHEST  2 VIEW COMPARISON:  04/19/2017 FINDINGS: Cardiac shadow remains enlarged. Pacing device is now seen without evidence of pneumothorax. The lungs are well aerated bilaterally. Minimal basilar atelectasis is seen. No bony abnormality is noted. IMPRESSION: No pneumothorax following pacemaker placement. Minimal bibasilar atelectasis is seen. Electronically Signed   By: Inez Catalina M.D.   On: 04/21/2017 08:49    Cardiac Studies   04/18/17: TTE Study Conclusions - Left ventricle: The cavity size was normal. There was severe concentric hypertrophy. Systolic function was vigorous. The estimated ejection fraction was in the range of 65% to 70%.  Wall motion was normal; there were no regional wall motion abnormalities. The study is not technically sufficient to allow evaluation of LV diastolic function. - Aortic valve: Calcified with moderate stenosis. There was trivial regurgitation. Mean gradient (S): 19 mm Hg. Peak gradient (S): 43 mm Hg. Valve area (VTI): 1.25 cm^2. Valve area (Vmax): 1.1 cm^2. Valve area (Vmean): 1.22 cm^2. - Mitral valve: Thickened, sclerotic leaflets. MAC. Mild to moderate regurgitation. - Left atrium: Severely dilated. - Right ventricle: The cavity size was mildly dilated. Systolic function was normal. - Right atrium: Severely dilated. - Tricuspid valve: There was moderate regurgitation. - Pulmonary arteries: PA peak pressure: 72 mm Hg (S). - Inferior vena cava: The vessel was normal in size. The respirophasic diameter changes were in the normal range (>= 50%), consistent with normal central venous pressure. Impressions: - Compared to a prior study in 04/2016, the LVEF is higher at 65-70%. There is severe LVH and now moderate aortic stenosis with a mean gradient of 19 mmHg (up from 10 mmHg). Severe biatrial enlargement is noted with mild to moderate MR, moderate TR and moderate to severe pulmonary hypertension and RVSP of 72 mmHg with a normal sized IVC.    Patient Profile     84 y.o. male with a hx of ESRF/HD, HTN, HLD, CVA (x2), DM, admitted with fluid OL missing 2 HD sessions, found in CHB, BB was held, conduction system did not improve and is s/p PPM yesterday  Assessment & Plan    1. High degree AVBlock, CHB     no improvement despite stopping BB     Now s/p PPM implant yesterday w/Dr. Lovena Le     Site looks good     CXR is w/o PTX this morning     Device check this morning with intact function     Post-procedure abx dosing w/pharmacy given ESRF     Site care and activity instructions were discussed with the patient and provided in the AVS     Routine post  PPM follow up has been arranged    Please remove tegaderm/outside bandage day of discharge, leave steri-strips in place  2. Respiratory insufficiency     Acute CHF     Missed HD (x2 sessions) prior to admission     Continue Fluid management with HD     resolved  3. Severe LVH, preserved LVEF      (concentric)      continue BP control  4. Mild abnormal Trop     Difficult to interpret with ESRF, no c/o CP     No  inpatient ischemic w/u planned per cardiology  5. Abnormal LFTs     Continue to improve   EP service will sign off though remain available, please recall if needed.   EP attending  Patient seen and examined.  Agree with the findings as noted above.  He is doing well after insertion of a dual-chamber pacemaker for symptomatic 2-1 heart block.  His pocket looks good with no evidence of hematoma.  Pacemaker interrogation under my direct supervision demonstrates normal dual-chamber pacing with a His bundle lead in place.  He will be discharged home and follow-up in our office for a wound check in approximately 10 days.  I will see him back in a couple of months.  Cristopher Peru, MD     For questions or updates, please contact [facility] Please consult [email or web address] for contact info under Cardiology/STEMI.      Signed, Baldwin Jamaica, PA-C  04/21/2017, 3:02 PM

## 2017-04-21 NOTE — Progress Notes (Signed)
PROGRESS NOTE    Sean Best  MVE:[id number] DOB: 1933-05-10 DOA: 04/17/2017 PCP: Seward Carol, MD   Brief Narrative: Sean Best is a 84 y.o. male with medical history significant ofend-stage renal disease on Monday Wednesday Friday dialysis, hyperlipidemia, hypertension, aortic aneurysm, history of stroke, type 2 diabetes. He presented with dyspnea and found to be fluid overloaded secondary to HD non-adherence. While admitted he was found to have AV block and required pacemaker. In addition, he had elevated AST/ALT secondary to fluid overload which is now improving.   Assessment & Plan:   Principal Problem:   Acute hypoxemic respiratory failure (HCC) Active Problems:   Anemia of chronic disease   DM (diabetes mellitus), type 2 with renal complications, diet controlled   Benign hypertension   Hyperlipidemia   ESRD on hemodialysis (HCC)   Heart block   Fluid overload   Elevated troponin   Acute respiratory failure with hypoxia Secondary to fluid overload from missed dialysis. Patient initially required BiPAP which has been weaned. Patient improved with hemodialysis.  Third degree heart block Cardiology consulted. Initially betablocker was discontinued, however, patient continued to have heart block. PPM placed on 04/20/17. Outpatient follow-up with electrophysiology.  Shock liver Secondary to fluid overload. Significant elevation of AST/ALT which has been downtrending. GI consulted and is following -GI recommendations  Elevated troponin Demand ischemia. Cardiology consulted. Transthoracic Echocardiogram significant for normal EF with LVH, biatrial enlargement, MR/TR and significant PAH. Plan for outpatient Myoview per cardiology.  Chronic pain -Continue gabapentin  Lactic acidosis Resolved.  Hyperkalemia Resolved with hemodialysis  ESRD MWF -Nephrology recommendations  Essential hypertension -Continue amlodipine and lisinopril -Metoprolol discontinued  DVT  prophylaxis: heparin Code Status:   Code Status: Full Code Family Communication: None Disposition Plan: Pending medical improvement   Consultants:   Nephrology  Cardiology  Electrophysiology  Gastroenterology  Procedures:   Pacemaker (04/20/17)  Echocardiogram (04/18/17) Study Conclusions  - Left ventricle: The cavity size was normal. There was severe   concentric hypertrophy. Systolic function was vigorous. The   estimated ejection fraction was in the range of 65% to 70%. Wall   motion was normal; there were no regional wall motion   abnormalities. The study is not technically sufficient to allow   evaluation of LV diastolic function. - Aortic valve: Calcified with moderate stenosis. There was trivial   regurgitation. Mean gradient (S): 19 mm Hg. Peak gradient (S): 43   mm Hg. Valve area (VTI): 1.25 cm^2. Valve area (Vmax): 1.1 cm^2.   Valve area (Vmean): 1.22 cm^2. - Mitral valve: Thickened, sclerotic leaflets. MAC. Mild to   moderate regurgitation. - Left atrium: Severely dilated. - Right ventricle: The cavity size was mildly dilated. Systolic   function was normal. - Right atrium: Severely dilated. - Tricuspid valve: There was moderate regurgitation. - Pulmonary arteries: PA peak pressure: 72 mm Hg (S). - Inferior vena cava: The vessel was normal in size. The   respirophasic diameter changes were in the normal range (>= 50%),   consistent with normal central venous pressure.  Impressions:  - Compared to a prior study in 04/2016, the LVEF is higher at   65-70%. There is severe LVH and now moderate aortic stenosis with   a mean gradient of 19 mmHg (up from 10 mmHg). Severe biatrial   enlargement is noted with mild to moderate MR, moderate TR and   moderate to severe pulmonary hypertension and RVSP of 72 mmHg   with a normal sized IVC.  Antimicrobials:  None  Subjective: No concerns this AM. No chest pain or dyspnea.  Objective: Vitals:   04/21/17  1200 04/21/17 1230 04/21/17 1236 04/21/17 1326  BP: (!) 115/56 (!) 133/36 (!) 106/35 (!) 106/57  Pulse: 88 89 88 83  Resp: _0 Temp:   98.2 F (36.8 C) 97.8 F (36.6 C)  TempSrc:   Oral Oral  SpO2:   99% 98%  Weight:   86.1 kg (189 lb 13.1 oz)   Height:        Intake/Output Summary (Last 24 hours) at 04/21/2017 1557 Last data filed at 04/21/2017 1236 Gross per 24 hour  Intake 240 ml  Output 2000 ml  Net -1760 ml   Filed Weights   04/21/17 0335 04/21/17 0830 04/21/17 1236  Weight: 87.7 kg (193 lb 5.5 oz) 88.2 kg (194 lb 7.1 oz) 86.1 kg (189 lb 13.1 oz)    Examination:  General exam: Appears calm and comfortable Respiratory system: Clear to auscultation. Respiratory effort normal. Cardiovascular system: S1 & S2 heard, Normal rate with regular rhythm. Gastrointestinal system: Abdomen is nondistended, soft and nontender. No organomegaly or masses felt. Normal bowel sounds heard. Central nervous system: Alert and oriented. No focal neurological deficits. Extremities: No edema. No calf tenderness Skin: No cyanosis. No rashes Psychiatry: Judgement and insight appear normal. Mood & affect appropriate.     Data Reviewed: I have personally reviewed following labs and imaging studies  CBC: Recent Labs  Lab 04/17/17 1800  04/18/17 0222 04/18/17 1437 04/19/17 0206 04/20/17 0236 04/21/17 0253  WBC 10.8*   < > 13.5* 13.4* 11.8* 10.7* 8.0  NEUTROABS 8.0*  --   --   --   --   --   --   HGB 9.0*   < > 8.5* 8.2* 8.4* 8.9* 9.1*  HCT 29.0*   < > 27.7* 25.8* 26.9* 28.6* 29.1*  MCV 96.3   < > 96.2 94.5 96.4 96.9 97.0  PLT 349   < > 382 349 312 358 340   < > = values in this interval not displayed.   Basic Metabolic Panel: Recent Labs  Lab 04/17/17 1800 04/17/17 2249 04/18/17 0222 04/18/17 0852 04/18/17 1437 04/19/17 0206 04/20/17 0236 04/21/17 0253  NA 141 140 143 138 138 137 136 138  K 5.8* 5.9* 6.9* 4.6 4.8 4.9 3.5 3.7  CL 97* 96* 99* 96* 98* 96* 94* 94*  CO2  20* 18* 17* _1 GLUCOSE 101* 91 97 91 92 87 89 86  BUN 74* 77* 82* 31* 36* 46* 36* 49*  CREATININE 15.52* 15.60* 16.02* 7.92* 8.32* 9.80* 7.53* 9.91*  CALCIUM 9.4 9.3 9.0 8.9 8.7* 8.7* 8.7* 8.9  MG 3.0*  --  3.2*  --  2.5*  --   --   --   PHOS  --  8.4*  --   --  4.8*  --   --   --    GFR: Estimated Creatinine Clearance: 5.9 mL/min (A) (by C-G formula based on SCr of 9.91 mg/dL (H)). Liver Function Tests: Recent Labs  Lab 04/18/17 0222 04/18/17 1437 04/19/17 0206 04/20/17 0236 04/21/17 0253  AST 531* 2,563* 1,965* 665* 379*  ALT 469* 2,152* 2,349* 1,571* 896*  ALKPHOS 68 62 61 68 63  BILITOT 1.1 1.4* 1.5* 1.4* 0.8  PROT 6.6 6.3* 5.9* 6.1* 6.1*  ALBUMIN 3.3* 3.1* 2.8* 2.9* 3.0*   No results for input(s): LIPASE, AMYLASE in the last 168 hours. No results for input(s): AMMONIA  in the last 168 hours. Coagulation Profile: Recent Labs  Lab 04/18/17 0222  INR 1.77   Cardiac Enzymes: Recent Labs  Lab 04/17/17 1800 04/17/17 2249 04/18/17 0222 04/18/17 0852 04/18/17 1437  TROPONINI 0.22* 0.20* 0.22* 0.35* 0.39*   BNP (last 3 results) No results for input(s): PROBNP in the last 8760 hours. HbA1C: No results for input(s): HGBA1C in the last 72 hours. CBG: Recent Labs  Lab 04/18/17 0239  GLUCAP 79   Lipid Profile: No results for input(s): CHOL, HDL, LDLCALC, TRIG, CHOLHDL, LDLDIRECT in the last 72 hours. Thyroid Function Tests: No results for input(s): TSH, T4TOTAL, FREET4, T3FREE, THYROIDAB in the last 72 hours. Anemia Panel: No results for input(s): VITAMINB12, FOLATE, FERRITIN, TIBC, IRON, RETICCTPCT in the last 72 hours. Sepsis Labs: Recent Labs  Lab 04/18/17 0228 04/18/17 0513  LATICACIDVEN 7.5* 1.0    Recent Results (from the past 240 hour(s))  MRSA PCR Screening     Status: None   Collection Time: 04/17/17 10:22 PM  Result Value Ref Range Status   MRSA by PCR NEGATIVE NEGATIVE Final    Comment:        The GeneXpert MRSA Assay  (FDA approved for NASAL specimens only), is one component of a comprehensive MRSA colonization surveillance program. It is not intended to diagnose MRSA infection nor to guide or monitor treatment for MRSA infections. Performed at [facility], [place] [address], [place], [place] [address]   Surgical PCR screen     Status: None   Collection Time: 04/19/17  6:38 AM  Result Value Ref Range Status   MRSA, PCR NEGATIVE NEGATIVE Final   Staphylococcus aureus NEGATIVE NEGATIVE Final    Comment: (NOTE) The Xpert SA Assay (FDA approved for NASAL specimens in patients 83 years of age and older), is one component of a comprehensive surveillance program. It is not intended to diagnose infection nor to guide or monitor treatment. Performed at [facility], [place] [address], [place], [place] [address]          Radiology Studies: Dg Chest 2 View  Result Date: 04/21/2017 CLINICAL DATA:  Status post pacer placement EXAM: CHEST  2 VIEW COMPARISON:  04/19/2017 FINDINGS: Cardiac shadow remains enlarged. Pacing device is now seen without evidence of pneumothorax. The lungs are well aerated bilaterally. Minimal basilar atelectasis is seen. No bony abnormality is noted. IMPRESSION: No pneumothorax following pacemaker placement. Minimal bibasilar atelectasis is seen. Electronically Signed   By: Inez Catalina M.D.   On: 04/21/2017 08:49        Scheduled Meds: . allopurinol  100 mg Oral Daily  . amLODipine  5 mg Oral QHS  . cholecalciferol  1,000 Units Oral Daily  . cinacalcet  30 mg Oral Q M,W,F-HD  . darbepoetin (ARANESP) injection - DIALYSIS  40 mcg Intravenous Q Wed-HD  . gabapentin  100 mg Oral QHS  . heparin  5,000 Units Subcutaneous Q8H  . [START ON 04/22/2017] heparin  100 Units/kg Dialysis Once in dialysis  . lisinopril  10 mg Oral QHS  . sevelamer carbonate  2,400 mg Oral TID WC  . sodium chloride flush  3 mL Intravenous Q12H   Continuous Infusions: . sodium chloride    .  sodium chloride       LOS: 4 days     Cordelia Poche, MD [facility] 04/21/2017, 3:57 PM Pager: [phone]  If 7PM-7AM, please contact night-coverage [email or web address] Password [facility] 04/21/2017, 3:57 PM

## 2017-04-21 NOTE — Progress Notes (Signed)
[facility] PROGRESS NOTE Subjective patient currently on dialysis. Feels fine no abdominal complaints.  Objective: Vital signs in last 24 hours: Temp:  [97.4 F (36.3 C)-98.3 F (36.8 C)] 97.5 F (36.4 C) (02/20 0836) Pulse Rate:  [35-85] 85 (02/20 1030) Resp:  [13-36] 17 (02/20 1030) BP: (100-164)/(35-92) 135/35 (02/20 1030) SpO2:  [94 %-100 %] 100 % (02/20 1000) Weight:  [87.7 kg (193 lb 5.5 oz)-88.2 kg (194 lb 7.1 oz)] 88.2 kg (194 lb 7.1 oz) (02/20 0830) Last BM Date: 04/19/17  Intake/Output from previous day: 02/19 0701 - 02/20 0700 In: 480 [P.O.:480] Out: 575 [Urine:575] Intake/Output this shift: No intake/output data recorded.    Lab Results: Recent Labs    04/18/17 1437 04/19/17 0206 04/20/17 0236 04/21/17 0253  WBC 13.4* 11.8* 10.7* 8.0  HGB 8.2* 8.4* 8.9* 9.1*  HCT 25.8* 26.9* 28.6* 29.1*  PLT 349 312 358 340   BMET Recent Labs    04/18/17 1437 04/19/17 0206 04/20/17 0236 04/21/17 0253  NA 138 137 136 138  K 4.8 4.9 3.5 3.7  CL 98* 96* 94* 94*  CO2 23 25 27 27   CREATININE 8.32* 9.80* 7.53* 9.91*   LFT Recent Labs    04/18/17 1437 04/19/17 0206 04/20/17 0236 04/21/17 0253  PROT 6.3* 5.9* 6.1* 6.1*  AST 2,563* 1,965* 665* 379*  ALT 2,152* 2,349* 1,571* 896*  ALKPHOS 62 61 68 63  BILITOT 1.4* 1.5* 1.4* 0.8  BILIDIR 0.2  --   --   --   IBILI 1.2*  --   --   --    PT/INR No results for input(s): LABPROT, INR in the last 72 hours. PANCREAS No results for input(s): LIPASE in the last 72 hours.       Studies/Results: Dg Chest 2 View  Result Date: 04/21/2017 CLINICAL DATA:  Status post pacer placement EXAM: CHEST  2 VIEW COMPARISON:  04/19/2017 FINDINGS: Cardiac shadow remains enlarged. Pacing device is now seen without evidence of pneumothorax. The lungs are well aerated bilaterally. Minimal basilar atelectasis is seen. No bony abnormality is noted. IMPRESSION: No pneumothorax following pacemaker placement. Minimal bibasilar  atelectasis is seen. Electronically Signed   By: Inez Catalina M.D.   On: 04/21/2017 08:49    Medications: I have reviewed the patient's current medications.  Assessment:   1. Abnormal liver test. This is almost certainly shock liver. Liver test continue to improve. Hepatitis markers pending but I suspect they will be negative   Plan: continue with current therapy and follow LFTs. Will check on Friday and hopefully the hepatitis markers will be back by then.   Nancy Fetter 04/21/2017, 11:12 AM  This note was created using voice recognition software. Minor errors may Have occurred unintentionally.  Pager: [phone] If no answer or after hours call [phone]

## 2017-04-21 NOTE — Progress Notes (Addendum)
[facility] Progress Note   Subjective: Awake, alert, says R shoulder "a little sore". Says he is getting over PNA.   Objective Vitals:   04/21/17 0900 04/21/17 0930 04/21/17 1000 04/21/17 1030  BP: (!) 151/60 134/72 (!) 146/45 (!) 135/35  Pulse: 65 79 83 85  Resp: 18 17 18 17   Temp:      TempSrc:      SpO2:   100%   Weight:      Height:       Physical Exam General: Pleasant elderly male in NAD Heart: S1,S2 no M/R/G Lungs: CTAB Abdomen: active BS  Extremities: no LE edema Dialysis Access: L AVF blood lines connected  Additional Objective Labs: Basic Metabolic Panel: Recent Labs  Lab 04/17/17 2249  04/18/17 1437 04/19/17 0206 04/20/17 0236 04/21/17 0253  NA 140   < > 138 137 136 138  K 5.9*   < > 4.8 4.9 3.5 3.7  CL 96*   < > 98* 96* 94* 94*  CO2 18*   < > 23 25 27 27   GLUCOSE 91   < > 92 87 89 86  BUN 77*   < > 36* 46* 36* 49*  CREATININE 15.60*   < > 8.32* 9.80* 7.53* 9.91*  CALCIUM 9.3   < > 8.7* 8.7* 8.7* 8.9  PHOS 8.4*  --  4.8*  --   --   --    < > = values in this interval not displayed.   Liver Function Tests: Recent Labs  Lab 04/19/17 0206 04/20/17 0236 04/21/17 0253  AST 1,965* 665* 379*  ALT 2,349* 1,571* 896*  ALKPHOS 61 68 63  BILITOT 1.5* 1.4* 0.8  PROT 5.9* 6.1* 6.1*  ALBUMIN 2.8* 2.9* 3.0*   No results for input(s): LIPASE, AMYLASE in the last 168 hours. CBC: Recent Labs  Lab 04/17/17 1800  04/18/17 0222 04/18/17 1437 04/19/17 0206 04/20/17 0236 04/21/17 0253  WBC 10.8*   < > 13.5* 13.4* 11.8* 10.7* 8.0  NEUTROABS 8.0*  --   --   --   --   --   --   HGB 9.0*   < > 8.5* 8.2* 8.4* 8.9* 9.1*  HCT 29.0*   < > 27.7* 25.8* 26.9* 28.6* 29.1*  MCV 96.3   < > 96.2 94.5 96.4 96.9 97.0  PLT 349   < > 382 349 312 358 340   < > = values in this interval not displayed.   Blood Culture    Component Value Date/Time   SDES BLOOD RIGHT HAND 12/28/2014 1725   SPECREQUEST BOTTLES DRAWN AEROBIC ONLY 6CC 12/28/2014 1725   CULT NO GROWTH 5 DAYS 12/28/2014 1725   REPTSTATUS 01/02/2015 FINAL 12/28/2014 1725    Cardiac Enzymes: Recent Labs  Lab 04/17/17 1800 04/17/17 2249 04/18/17 0222 04/18/17 0852 04/18/17 1437  TROPONINI 0.22* 0.20* 0.22* 0.35* 0.39*   CBG: Recent Labs  Lab 04/18/17 0239  GLUCAP 79   Iron Studies: No results for input(s): IRON, TIBC, TRANSFERRIN, FERRITIN in the last 72 hours. @lablastinr3 @ Studies/Results: Dg Chest 2 View  Result Date: 04/21/2017 CLINICAL DATA:  Status post pacer placement EXAM: CHEST  2 VIEW COMPARISON:  04/19/2017 FINDINGS: Cardiac shadow remains enlarged. Pacing device is now seen without evidence of pneumothorax. The lungs are well aerated bilaterally. Minimal basilar atelectasis is seen. No bony abnormality is noted. IMPRESSION: No pneumothorax following pacemaker placement. Minimal bibasilar atelectasis is seen. Electronically Signed   By: Inez Catalina M.D.   On:  04/21/2017 08:49   Medications: . sodium chloride    . sodium chloride    .  ceFAZolin (ANCEF) IV     . allopurinol  100 mg Oral Daily  . amLODipine  5 mg Oral QHS  . cholecalciferol  1,000 Units Oral Daily  . cinacalcet  30 mg Oral Q M,W,F-HD  . gabapentin  100 mg Oral QHS  . heparin  5,000 Units Subcutaneous Q8H  . [START ON 04/22/2017] heparin  100 Units/kg Dialysis Once in dialysis  . lisinopril  10 mg Oral QHS  . sevelamer carbonate  2,400 mg Oral TID WC  . sodium chloride flush  3 mL Intravenous Q12H   HD orders: [facility] MWF 4 hrs 180 NRe 450/800 manual 92.5 kg. 2.0 K/ 2.5 Ca UFP 2 -Heparin 6000 units IV TIW -Sensipar 120 mg PO TIW -Mircera 50 mcg IV q 2 weeks (Last dose 03/31/17) -Calcitriol 2.0 mcg PO TIW   Assessment/Plan: 1. 2:1 AVB S/P PPM placement 04/20/17 per Dr. Lovena Le 2. ESRD -MWF On HD per schedule 3. Anemia - HGB 9.1 No ESA since adm. Give Aranesp 40 mcg IV today.  4. Secondary hyperparathyroidism - cont binders, sensipar, VDRA.  5. HTN/volume - UFG 2.8 Blood  pressure stable. Under OP EDW. Lower EDW on DC.  6. Nutrition - Albumin 3.0 renal/carb mod diet nepro renal vits 7. DM Per primary   Rita H. Brown NP-C 04/21/2017, 11:00 AM  [facility] [phone]  Pt seen, examined and agree w A/P as above.  Kelly Splinter MD [employer] pager [phone]   04/21/2017, 2:54 PM

## 2017-04-22 DIAGNOSIS — E1122 Type 2 diabetes mellitus with diabetic chronic kidney disease: Secondary | ICD-10-CM

## 2017-04-22 LAB — COMPREHENSIVE METABOLIC PANEL
ALBUMIN: 3 g/dL — AB (ref 3.5–5.0)
ALT: 308 U/L — AB (ref 17–63)
AST: 223 U/L — ABNORMAL HIGH (ref 15–41)
Alkaline Phosphatase: 65 U/L (ref 38–126)
Anion gap: 14 (ref 5–15)
BUN: 23 mg/dL — ABNORMAL HIGH (ref 6–20)
CHLORIDE: 96 mmol/L — AB (ref 101–111)
CO2: 26 mmol/L (ref 22–32)
CREATININE: 6.26 mg/dL — AB (ref 0.61–1.24)
Calcium: 8.9 mg/dL (ref 8.9–10.3)
GFR calc non Af Amer: 7 mL/min — ABNORMAL LOW (ref >60)
GFR, EST AFRICAN AMERICAN: 9 mL/min — AB (ref >60)
Glucose, Bld: 94 mg/dL (ref 65–99)
Potassium: 3.6 mmol/L (ref 3.5–5.1)
SODIUM: 136 mmol/L (ref 135–145)
Total Bilirubin: 0.5 mg/dL (ref 0.3–1.2)
Total Protein: 6.3 g/dL — ABNORMAL LOW (ref 6.5–8.1)

## 2017-04-22 LAB — CBC
HCT: 30.5 % — ABNORMAL LOW (ref 39.0–52.0)
Hemoglobin: 9.5 g/dL — ABNORMAL LOW (ref 13.0–17.0)
MCH: 30.5 pg (ref 26.0–34.0)
MCHC: 31.1 g/dL (ref 30.0–36.0)
MCV: 98.1 fL (ref 78.0–100.0)
PLATELETS: 331 10*3/uL (ref 150–400)
RBC: 3.11 MIL/uL — AB (ref 4.22–5.81)
RDW: 18 % — ABNORMAL HIGH (ref 11.5–15.5)
WBC: 9.6 10*3/uL (ref 4.0–10.5)

## 2017-04-22 LAB — HEPATITIS PANEL, ACUTE
HEP A IGM: NEGATIVE
HEP B C IGM: NEGATIVE
Hepatitis B Surface Ag: NEGATIVE

## 2017-04-22 NOTE — Care Management Note (Addendum)
Case Management Note  Patient Details  Name: Sean Best MRN: [id number] Date of Birth: [birth year]  Subjective/Objective:        Pt admitted with SOB and HB - pt is now s/p pacemaker           Action/Plan:  Pt independent from home alone - ESRD pt on HD.  HH/DME recommended - CM requested order for both HH and DME in addition to  face to face.  Pt also request HHRN.  Pt given choice and he chose [facility] - tenative referral given   Expected Discharge Date:                  Expected Discharge Plan:  [place]  In-House Referral:     Discharge planning Services  CM Consult  Post Acute Care Choice:    Choice offered to:     DME Arranged:    DME Agency:     HH Arranged:    HH Agency:     Status of Service:     If discussed at [employer], dates discussed:    Additional Comments:  Maryclare Labrador, RN 04/22/2017, 3:20 PM

## 2017-04-22 NOTE — Progress Notes (Signed)
[facility] Progress Note   Subjective: alert, up in chair, says probably going home tomorrow, no SOB/ cough  Objective Vitals:   04/21/17 2313 04/22/17 0412 04/22/17 0609 04/22/17 0713  BP: (!) 117/54 132/73  135/89  Pulse: 86 87  87  Resp: 15 20  (!) 22  Temp: 98 F (36.7 C) 98.7 F (37.1 C)  (!) 97.4 F (36.3 C)  TempSrc: Oral Oral  Oral  SpO2: 94% 100%  96%  Weight:   86.3 kg (190 lb 4.8 oz)   Height:       Physical Exam General: Pleasant elderly male in NAD Heart: S1,S2 no M/R/G Lungs: CTAB Abdomen: active BS  Extremities: no LE edema Dialysis Access: L AVF blood lines connected  Additional Objective Labs: Basic Metabolic Panel: Recent Labs  Lab 04/17/17 2249  04/18/17 1437  04/20/17 0236 04/21/17 0253 04/22/17 0219  NA 140   < > 138   < > 136 138 136  K 5.9*   < > 4.8   < > 3.5 3.7 3.6  CL 96*   < > 98*   < > 94* 94* 96*  CO2 18*   < > 23   < > 27 27 26   GLUCOSE 91   < > 92   < > 89 86 94  BUN 77*   < > 36*   < > 36* 49* 23*  CREATININE 15.60*   < > 8.32*   < > 7.53* 9.91* 6.26*  CALCIUM 9.3   < > 8.7*   < > 8.7* 8.9 8.9  PHOS 8.4*  --  4.8*  --   --   --   --    < > = values in this interval not displayed.   Liver Function Tests: Recent Labs  Lab 04/20/17 0236 04/21/17 0253 04/22/17 0219  AST 665* 379* 223*  ALT 1,571* 896* 308*  ALKPHOS 68 63 65  BILITOT 1.4* 0.8 0.5  PROT 6.1* 6.1* 6.3*  ALBUMIN 2.9* 3.0* 3.0*   No results for input(s): LIPASE, AMYLASE in the last 168 hours. CBC: Recent Labs  Lab 04/17/17 1800  04/18/17 1437 04/19/17 0206 04/20/17 0236 04/21/17 0253 04/22/17 0219  WBC 10.8*   < > 13.4* 11.8* 10.7* 8.0 9.6  NEUTROABS 8.0*  --   --   --   --   --   --   HGB 9.0*   < > 8.2* 8.4* 8.9* 9.1* 9.5*  HCT 29.0*   < > 25.8* 26.9* 28.6* 29.1* 30.5*  MCV 96.3   < > 94.5 96.4 96.9 97.0 98.1  PLT 349   < > 349 312 358 340 331   < > = values in this interval not displayed.   Blood Culture    Component Value  Date/Time   SDES BLOOD RIGHT HAND 12/28/2014 1725   SPECREQUEST BOTTLES DRAWN AEROBIC ONLY 6CC 12/28/2014 1725   CULT NO GROWTH 5 DAYS 12/28/2014 1725   REPTSTATUS 01/02/2015 FINAL 12/28/2014 1725    Cardiac Enzymes: Recent Labs  Lab 04/17/17 1800 04/17/17 2249 04/18/17 0222 04/18/17 0852 04/18/17 1437  TROPONINI 0.22* 0.20* 0.22* 0.35* 0.39*   CBG: Recent Labs  Lab 04/18/17 0239  GLUCAP 79   Iron Studies: No results for input(s): IRON, TIBC, TRANSFERRIN, FERRITIN in the last 72 hours. @lablastinr3 @ Studies/Results: Dg Chest 2 View  Result Date: 04/21/2017 CLINICAL DATA:  Status post pacer placement EXAM: CHEST  2 VIEW COMPARISON:  04/19/2017 FINDINGS: Cardiac shadow remains enlarged.  Pacing device is now seen without evidence of pneumothorax. The lungs are well aerated bilaterally. Minimal basilar atelectasis is seen. No bony abnormality is noted. IMPRESSION: No pneumothorax following pacemaker placement. Minimal bibasilar atelectasis is seen. Electronically Signed   By: Inez Catalina M.D.   On: 04/21/2017 08:49   Medications: . sodium chloride    . sodium chloride     . allopurinol  100 mg Oral Daily  . amLODipine  5 mg Oral QHS  . cholecalciferol  1,000 Units Oral Daily  . cinacalcet  30 mg Oral Q M,W,F-HD  . darbepoetin (ARANESP) injection - DIALYSIS  40 mcg Intravenous Q Wed-HD  . gabapentin  100 mg Oral QHS  . heparin  5,000 Units Subcutaneous Q8H  . heparin  100 Units/kg Dialysis Once in dialysis  . lisinopril  10 mg Oral QHS  . sevelamer carbonate  2,400 mg Oral TID WC  . sodium chloride flush  3 mL Intravenous Q12H   HD orders: [facility] MWF 4h   2/2.5  Hep 6000   92.5kg -Sensipar 120 mg PO TIW -Mircera 50 mcg IV q 2 weeks (Last dose 03/31/17) -Calcitriol 2.0 mcg PO TIW   Assessment/Plan: 1. 2:1 AVB S/P PPM placement 04/20/17 per Dr. Lovena Le 2. ESRD -MWF On HD per schedule.  HD in am tomorrow 3. Anemia - HGB 9.1 No ESA since adm. Give Aranesp 40 mcg IV today.   4. Secondary hyperparathyroidism - cont binders, sensipar, VDRA.  5. HTN/volume - UFG 2.8 Blood pressure stable. Under OP EDW. Lower EDW on DC.  6. Nutrition - Albumin 3.0 renal/carb mod diet nepro renal vits 7. DM Per primary     Kelly Splinter MD [place] pager [phone]   04/22/2017, 12:18 PM

## 2017-04-22 NOTE — Evaluation (Signed)
Physical Therapy Evaluation Patient Details Name: Trevonn Hallum MRN: [id number] DOB: [birth year] Today's Date: 04/22/2017   History of Present Illness  Pt is an 84 y.o. male admitted 04/17/17 with dyspnea; found to be in fluid overload secondary to HD non-adherence. Found to have AV block; now s/p pacemaker implant 2/19. PMH includes ESRD (HD MWF), HTN, CVA (1998), blind L eye, arthritis.     Clinical Impression  Pt presents with an overall decrease in functional mobility secondary to above. PTA, pt mod indep with RW/SPC and lives alone; can have 24/7 support from daughter if needed upon return home. Educ on pacemaker precautions, requiring intermittent cues to maintain these with transfers during session. Overall, pt moving very well; amb 200' with RW and supervision for safety. HR 86 at rest, up to 95 while walking. Pt would benefit from continued acute PT services to maximize functional mobility and independence prior to d/c with HHPT services.     Follow Up Recommendations Home health PT;Supervision - Intermittent    Equipment Recommendations  Rolling walker with 5" wheels    Recommendations for Other Services OT consult     Precautions / Restrictions Precautions Precautions: Fall;ICD/Pacemaker Restrictions Weight Bearing Restrictions: No      Mobility  Bed Mobility Overal bed mobility: Needs Assistance Bed Mobility: Rolling;Sidelying to Sit Rolling: Supervision Sidelying to sit: Supervision       General bed mobility comments: Educ on log roll technique to decrease stress through LUE; pt with good technique  Transfers Overall transfer level: Needs assistance Equipment used: Rolling walker (2 wheeled) Transfers: Sit to/from Stand Sit to Stand: Supervision         General transfer comment: Cues for decreased WB through LUE and not pull on RW; good response to cues and ability to correct. Supervision for safety  Ambulation/Gait Ambulation/Gait assistance:  Supervision Ambulation Distance (Feet): 200 Feet Assistive device: Rolling walker (2 wheeled) Gait Pattern/deviations: Step-through pattern;Decreased stride length Gait velocity: Decreased Gait velocity interpretation: <1.8 ft/sec, indicative of risk for recurrent falls General Gait Details: Slow, controlled amb with RW and supervision for safety. HR up to 95 with ambulation. Pt moves well, just very slow  Stairs            Wheelchair Mobility    Modified Rankin (Stroke Patients Only)       Balance Overall balance assessment: Needs assistance   Sitting balance-Leahy Scale: Good Sitting balance - Comments: Indep to don socks sitting EOB     Standing balance-Leahy Scale: Fair Standing balance comment: Can static stand with no UE support                             Pertinent Vitals/Pain Pain Assessment: Faces Faces Pain Scale: Hurts a little bit Pain Location: Pacemaker site Pain Descriptors / Indicators: Sore Pain Intervention(s): Monitored during session    Home Living Family/patient expects to be discharged to:: Private residence Living Arrangements: Alone Available Help at Discharge: Family;Available 24 hours/day(Daughter) Type of Home: House Home Access: Ramped entrance     Home Layout: One level Home Equipment: Cane - single point Additional Comments: Daughter can stay with pt for a few days at d/c     Prior Function           Comments: Indep with mobility using RW in house and [facility] in community. Does not drive; friends transports him to HD. Daughter checks in on pt as needed     Hand Dominance  Extremity/Trunk Assessment   Upper Extremity Assessment Upper Extremity Assessment: Generalized weakness    Lower Extremity Assessment Lower Extremity Assessment: Overall WFL for tasks assessed       Communication   Communication: No difficulties  Cognition Arousal/Alertness: Awake/alert Behavior During Therapy: WFL for tasks  assessed/performed Overall Cognitive Status: Within Functional Limits for tasks assessed                                        General Comments      Exercises     Assessment/Plan    PT Assessment Patient needs continued PT services  PT Problem List Decreased strength;Decreased activity tolerance;Decreased range of motion;Decreased balance;Decreased mobility       PT Treatment Interventions DME instruction;Gait training;Stair training;Functional mobility training;Therapeutic activities;Therapeutic exercise;Balance training;Patient/family education    PT Goals (Current goals can be found in the Care Plan section)  Acute Rehab PT Goals Patient Stated Goal: Return home PT Goal Formulation: With patient Time For Goal Achievement: 05/06/17 Potential to Achieve Goals: Good    Frequency Min 3X/week   Barriers to discharge        Co-evaluation               AM-PAC PT "6 Clicks" Daily Activity  Outcome Measure Difficulty turning over in bed (including adjusting bedclothes, sheets and blankets)?: A Little Difficulty moving from lying on back to sitting on the side of the bed? : A Little Difficulty sitting down on and standing up from a chair with arms (e.g., wheelchair, bedside commode, etc,.)?: A Little Help needed moving to and from a bed to chair (including a wheelchair)?: A Little Help needed walking in hospital room?: A Little Help needed climbing 3-5 steps with a railing? : A Little 6 Click Score: 18    End of Session Equipment Utilized During Treatment: Gait belt Activity Tolerance: Patient tolerated treatment well Patient left: in chair;with call bell/phone within reach Nurse Communication: Mobility status PT Visit Diagnosis: Other abnormalities of gait and mobility (R26.89)    Time: 3875-6433 PT Time Calculation (min) (ACUTE ONLY): 25 min   Charges:   PT Evaluation $PT Eval Moderate Complexity: 1 Mod PT Treatments $Gait Training: 8-22  mins   PT G Codes:       Mabeline Caras, PT, DPT Acute Rehab Services  Pager: [place] 04/22/2017, 11:33 AM

## 2017-04-22 NOTE — Progress Notes (Signed)
PROGRESS NOTE    Arvis Zwahlen  KYH:[id number] DOB: 1934-02-05 DOA: 04/17/2017 PCP: Seward Carol, MD   Brief Narrative: Sean Best is a 84 y.o. male with medical history significant ofend-stage renal disease on Monday Wednesday Friday dialysis, hyperlipidemia, hypertension, aortic aneurysm, history of stroke, type 2 diabetes. He presented with dyspnea and found to be fluid overloaded secondary to HD non-adherence. While admitted he was found to have AV block and required pacemaker. In addition, he had elevated AST/ALT secondary to fluid overload which is now improving.   Assessment & Plan:   Principal Problem:   Acute hypoxemic respiratory failure (HCC) Active Problems:   Anemia of chronic disease   DM (diabetes mellitus), type 2 with renal complications, diet controlled   Benign hypertension   Hyperlipidemia   ESRD on hemodialysis (HCC)   Heart block   Fluid overload   Elevated troponin   Acute respiratory failure with hypoxia Secondary to fluid overload from missed dialysis. Patient initially required BiPAP which has been weaned. Patient improved with hemodialysis.  Third degree heart block Cardiology consulted. Initially betablocker was discontinued, however, patient continued to have heart block. PPM placed on 04/20/17. Outpatient follow-up with electrophysiology.  Shock liver Secondary to fluid overload. Significant elevation of AST/ALT which continues to downtrend. GI consulted and is following -GI recommendations  Elevated troponin Demand ischemia. Cardiology consulted. Transthoracic Echocardiogram significant for normal EF with LVH, biatrial enlargement, MR/TR and significant PAH. Plan for outpatient Myoview per cardiology.  Chronic pain -Continue gabapentin  Lactic acidosis Resolved.  Hyperkalemia Resolved with hemodialysis  ESRD MWF -Nephrology recommendations  Essential hypertension -Continue amlodipine and lisinopril -Metoprolol  discontinued  DVT prophylaxis: heparin Code Status:   Code Status: Full Code Family Communication: None Disposition Plan: Pending medical improvement and physical therapy recommendations.   Consultants:   Nephrology  Cardiology  Electrophysiology  Gastroenterology  Procedures:   Pacemaker (04/20/17)  Echocardiogram (04/18/17) Study Conclusions  - Left ventricle: The cavity size was normal. There was severe   concentric hypertrophy. Systolic function was vigorous. The   estimated ejection fraction was in the range of 65% to 70%. Wall   motion was normal; there were no regional wall motion   abnormalities. The study is not technically sufficient to allow   evaluation of LV diastolic function. - Aortic valve: Calcified with moderate stenosis. There was trivial   regurgitation. Mean gradient (S): 19 mm Hg. Peak gradient (S): 43   mm Hg. Valve area (VTI): 1.25 cm^2. Valve area (Vmax): 1.1 cm^2.   Valve area (Vmean): 1.22 cm^2. - Mitral valve: Thickened, sclerotic leaflets. MAC. Mild to   moderate regurgitation. - Left atrium: Severely dilated. - Right ventricle: The cavity size was mildly dilated. Systolic   function was normal. - Right atrium: Severely dilated. - Tricuspid valve: There was moderate regurgitation. - Pulmonary arteries: PA peak pressure: 72 mm Hg (S). - Inferior vena cava: The vessel was normal in size. The   respirophasic diameter changes were in the normal range (>= 50%),   consistent with normal central venous pressure.  Impressions:  - Compared to a prior study in 04/2016, the LVEF is higher at   65-70%. There is severe LVH and now moderate aortic stenosis with   a mean gradient of 19 mmHg (up from 10 mmHg). Severe biatrial   enlargement is noted with mild to moderate MR, moderate TR and   moderate to severe pulmonary hypertension and RVSP of 72 mmHg   with a normal sized IVC.  Antimicrobials:  None    Subjective: No concerns this AM. No  chest pain or dyspnea.  Objective: Vitals:   04/21/17 2313 04/22/17 0412 04/22/17 0609 04/22/17 0713  BP: (!) 117/54 132/73  135/89  Pulse: 86 87  87  Resp: 15 20  (!) 22  Temp: 98 F (36.7 C) 98.7 F (37.1 C)  (!) 97.4 F (36.3 C)  TempSrc: Oral Oral  Oral  SpO2: 94% 100%  96%  Weight:   86.3 kg (190 lb 4.8 oz)   Height:        Intake/Output Summary (Last 24 hours) at 04/22/2017 0927 Last data filed at 04/22/2017 0400 Gross per 24 hour  Intake 413 ml  Output 2000 ml  Net -1587 ml   Filed Weights   04/21/17 0830 04/21/17 1236 04/22/17 0609  Weight: 88.2 kg (194 lb 7.1 oz) 86.1 kg (189 lb 13.1 oz) 86.3 kg (190 lb 4.8 oz)    Examination:  General exam: Appears calm and comfortable Respiratory system: Respiratory: Clear to auscultation bilaterally. Unlabored work of breathing. No wheezing or rales. Cardiovascular system: S1 & S2 heard, Normal rate with regular rhythm. 2/6 systolic murmur Gastrointestinal system: Abdomen is nondistended, soft and nontender. No organomegaly or masses felt. Normal bowel sounds heard. Central nervous system: Alert and oriented. No focal neurological deficits. Extremities: No edema. No calf tenderness Skin: No cyanosis. No rashes Psychiatry: Judgement and insight appear normal. Mood & affect appropriate.     Data Reviewed: I have personally reviewed following labs and imaging studies  CBC: Recent Labs  Lab 04/17/17 1800  04/18/17 1437 04/19/17 0206 04/20/17 0236 04/21/17 0253 04/22/17 0219  WBC 10.8*   < > 13.4* 11.8* 10.7* 8.0 9.6  NEUTROABS 8.0*  --   --   --   --   --   --   HGB 9.0*   < > 8.2* 8.4* 8.9* 9.1* 9.5*  HCT 29.0*   < > 25.8* 26.9* 28.6* 29.1* 30.5*  MCV 96.3   < > 94.5 96.4 96.9 97.0 98.1  PLT 349   < > 349 312 358 340 331   < > = values in this interval not displayed.   Basic Metabolic Panel: Recent Labs  Lab 04/17/17 1800 04/17/17 2249 04/18/17 0222  04/18/17 1437 04/19/17 0206 04/20/17 0236 04/21/17 0253  04/22/17 0219  NA 141 140 143   < > 138 137 136 138 136  K 5.8* 5.9* 6.9*   < > 4.8 4.9 3.5 3.7 3.6  CL 97* 96* 99*   < > 98* 96* 94* 94* 96*  CO2 20* 18* 17*   < > _0 GLUCOSE 101* 91 97   < > 92 87 89 86 94  BUN 74* 77* 82*   < > 36* 46* 36* 49* 23*  CREATININE 15.52* 15.60* 16.02*   < > 8.32* 9.80* 7.53* 9.91* 6.26*  CALCIUM 9.4 9.3 9.0   < > 8.7* 8.7* 8.7* 8.9 8.9  MG 3.0*  --  3.2*  --  2.5*  --   --   --   --   PHOS  --  8.4*  --   --  4.8*  --   --   --   --    < > = values in this interval not displayed.   GFR: Estimated Creatinine Clearance: 9.3 mL/min (A) (by C-G formula based on SCr of 6.26 mg/dL (H)). Liver Function Tests: Recent Labs  Lab 04/18/17 1437 04/19/17 0206  04/20/17 0236 04/21/17 0253 04/22/17 0219  AST 2,563* 1,965* 665* 379* 223*  ALT 2,152* 2,349* 1,571* 896* 308*  ALKPHOS 62 61 68 63 65  BILITOT 1.4* 1.5* 1.4* 0.8 0.5  PROT 6.3* 5.9* 6.1* 6.1* 6.3*  ALBUMIN 3.1* 2.8* 2.9* 3.0* 3.0*   No results for input(s): LIPASE, AMYLASE in the last 168 hours. No results for input(s): AMMONIA in the last 168 hours. Coagulation Profile: Recent Labs  Lab 04/18/17 0222  INR 1.77   Cardiac Enzymes: Recent Labs  Lab 04/17/17 1800 04/17/17 2249 04/18/17 0222 04/18/17 0852 04/18/17 1437  TROPONINI 0.22* 0.20* 0.22* 0.35* 0.39*   BNP (last 3 results) No results for input(s): PROBNP in the last 8760 hours. HbA1C: No results for input(s): HGBA1C in the last 72 hours. CBG: Recent Labs  Lab 04/18/17 0239  GLUCAP 79   Lipid Profile: No results for input(s): CHOL, HDL, LDLCALC, TRIG, CHOLHDL, LDLDIRECT in the last 72 hours. Thyroid Function Tests: No results for input(s): TSH, T4TOTAL, FREET4, T3FREE, THYROIDAB in the last 72 hours. Anemia Panel: No results for input(s): VITAMINB12, FOLATE, FERRITIN, TIBC, IRON, RETICCTPCT in the last 72 hours. Sepsis Labs: Recent Labs  Lab 04/18/17 0228 04/18/17 0513  LATICACIDVEN 7.5* 1.0    Recent  Results (from the past 240 hour(s))  MRSA PCR Screening     Status: None   Collection Time: 04/17/17 10:22 PM  Result Value Ref Range Status   MRSA by PCR NEGATIVE NEGATIVE Final    Comment:        The GeneXpert MRSA Assay (FDA approved for NASAL specimens only), is one component of a comprehensive MRSA colonization surveillance program. It is not intended to diagnose MRSA infection nor to guide or monitor treatment for MRSA infections. Performed at [facility], [place] [address], [place], [place] [address]   Surgical PCR screen     Status: None   Collection Time: 04/19/17  6:38 AM  Result Value Ref Range Status   MRSA, PCR NEGATIVE NEGATIVE Final   Staphylococcus aureus NEGATIVE NEGATIVE Final    Comment: (NOTE) The Xpert SA Assay (FDA approved for NASAL specimens in patients 89 years of age and older), is one component of a comprehensive surveillance program. It is not intended to diagnose infection nor to guide or monitor treatment. Performed at [facility], [place] [address], [place], [place] [address]          Radiology Studies: Dg Chest 2 View  Result Date: 04/21/2017 CLINICAL DATA:  Status post pacer placement EXAM: CHEST  2 VIEW COMPARISON:  04/19/2017 FINDINGS: Cardiac shadow remains enlarged. Pacing device is now seen without evidence of pneumothorax. The lungs are well aerated bilaterally. Minimal basilar atelectasis is seen. No bony abnormality is noted. IMPRESSION: No pneumothorax following pacemaker placement. Minimal bibasilar atelectasis is seen. Electronically Signed   By: Inez Catalina M.D.   On: 04/21/2017 08:49        Scheduled Meds: . allopurinol  100 mg Oral Daily  . amLODipine  5 mg Oral QHS  . cholecalciferol  1,000 Units Oral Daily  . cinacalcet  30 mg Oral Q M,W,F-HD  . darbepoetin (ARANESP) injection - DIALYSIS  40 mcg Intravenous Q Wed-HD  . gabapentin  100 mg Oral QHS  . heparin  5,000 Units Subcutaneous Q8H  . heparin  100  Units/kg Dialysis Once in dialysis  . lisinopril  10 mg Oral QHS  . sevelamer carbonate  2,400 mg Oral TID WC  . sodium  chloride flush  3 mL Intravenous Q12H   Continuous Infusions: . sodium chloride    . sodium chloride       LOS: 5 days     Cordelia Poche, MD [facility] 04/22/2017, 9:27 AM Pager: [phone]  If 7PM-7AM, please contact night-coverage [email or web address] Password [facility] 04/22/2017, 9:27 AM

## 2017-04-23 DIAGNOSIS — E785 Hyperlipidemia, unspecified: Secondary | ICD-10-CM

## 2017-04-23 LAB — COMPREHENSIVE METABOLIC PANEL
ALK PHOS: 68 U/L (ref 38–126)
ALT: 76 U/L — ABNORMAL HIGH (ref 17–63)
ANION GAP: 16 — AB (ref 5–15)
AST: 115 U/L — ABNORMAL HIGH (ref 15–41)
Albumin: 3.2 g/dL — ABNORMAL LOW (ref 3.5–5.0)
BILIRUBIN TOTAL: 0.6 mg/dL (ref 0.3–1.2)
BUN: 35 mg/dL — ABNORMAL HIGH (ref 6–20)
CALCIUM: 9.3 mg/dL (ref 8.9–10.3)
CO2: 24 mmol/L (ref 22–32)
Chloride: 95 mmol/L — ABNORMAL LOW (ref 101–111)
Creatinine, Ser: 8.5 mg/dL — ABNORMAL HIGH (ref 0.61–1.24)
GFR calc non Af Amer: 5 mL/min — ABNORMAL LOW (ref >60)
GFR, EST AFRICAN AMERICAN: 6 mL/min — AB (ref >60)
Glucose, Bld: 93 mg/dL (ref 65–99)
Potassium: 3.9 mmol/L (ref 3.5–5.1)
Sodium: 135 mmol/L (ref 135–145)
TOTAL PROTEIN: 6.6 g/dL (ref 6.5–8.1)

## 2017-04-23 LAB — CBC
HEMATOCRIT: 31.9 % — AB (ref 39.0–52.0)
Hemoglobin: 10 g/dL — ABNORMAL LOW (ref 13.0–17.0)
MCH: 30.6 pg (ref 26.0–34.0)
MCHC: 31.3 g/dL (ref 30.0–36.0)
MCV: 97.6 fL (ref 78.0–100.0)
PLATELETS: 311 10*3/uL (ref 150–400)
RBC: 3.27 MIL/uL — ABNORMAL LOW (ref 4.22–5.81)
RDW: 18.2 % — AB (ref 11.5–15.5)
WBC: 9.1 10*3/uL (ref 4.0–10.5)

## 2017-04-23 MED ORDER — VITAMIN D-3 125 MCG (5000 UT) PO TABS: 1000.0000 [IU] | ORAL_TABLET | Freq: Every day | ORAL | Status: DC

## 2017-04-23 MED ORDER — TRAMADOL HCL 50 MG PO TABS: 50.0000 mg | ORAL_TABLET | Freq: Two times a day (BID) | ORAL | 0 refills | Status: DC | PRN

## 2017-04-23 MED ORDER — TRAMADOL HCL 50 MG PO TABS
ORAL_TABLET | ORAL | Status: AC
  Filled 2017-04-23: qty 1

## 2017-04-23 NOTE — Progress Notes (Signed)
OT Cancellation Note  Patient Details Name: Sean Best MRN: [id number] DOB: Jul 15, 1933   Cancelled Treatment:    Reason Eval/Treat Not Completed: Patient at procedure or test/ unavailable(HD). OT will continue to follow for evaluation as time allows  Jaci Carrel 04/23/2017, 9:31 AM  Hulda Humphrey OTR/L [phone]

## 2017-04-23 NOTE — Progress Notes (Signed)
Called daughter who is pt's ride home; daughter will be here by 5pm.   [place]  Cheryn Lundquist, RN

## 2017-04-23 NOTE — Discharge Summary (Signed)
Physician Discharge Summary  Sean Best QIH:[id number] DOB: 1933/07/06 DOA: 04/17/2017  PCP: Seward Carol, MD  Admit date: 04/17/2017 Discharge date: 04/23/2017  Admitted From: Home Disposition: Home  Recommendations for Outpatient Follow-up:  1. Follow up with PCP in 1 week 2. Follow up with Cardiology/electrophysiology 3. Please obtain CMP/CBC in one week 4. Please follow up on the following pending results: None  Home Health: PT, RN Equipment/Devices: Rolling walker  Discharge Condition: Stable CODE STATUS: Full code Diet recommendation: Heart healthy/renal diet   Brief/Interim Summary:  Admission HPI written by Cristy Folks, MD   Chief Complaint: Shortness of breath  HPI: Sean Best is a 84 y.o. male with medical history significant of end-stage renal disease on Monday Wednesday Friday dialysis, hyperlipidemia, hypertension, aortic aneurysm, history of stroke, type 2 diabetes who comes in with shortness of breath.  Patient reports that last Friday he began to have cough, change in sputum production, and significant myalgias and chills.  He was seen by his PCP approximately 1 week ago and diagnosed with a possible pneumonia and given Augmentin.  He reports taking it religiously.  He reports however he continued to have profound fatigue, weakness and hence was unable to make his dialysis sessions on Wednesday and Friday.  He reports that his sputum cleared up and he just has small amounts of clear white sputum now.  He began to develop progressive shortness of breath, lower extremity edema, orthopnea, paroxysmal nocturnal dyspnea.  This is particular been exacerbated for the past 2 days and he has been having significantly worse dyspnea on exertion.  He denies any chest pain, abdominal pain, nausea vomiting, diarrhea, rash.  He does endorse significant weakness and fatigue.  ED Course: In the ED his vitals were notable for heart rate in the 30s-40s.  EKG showed very  prolonged PR interval concerning for possible third-degree heart block.  Labs were notable for potassium of 5.8.  And patient was noted to have elevated AST and ALT in the 200s.  CBC showed a white count of 10.8, hemoglobin of 9.0, BNP of greater than 2000 and elevated troponin of 0.22.  Chest x-ray showed pulmonary edema.    Hospital course:  Acute respiratory failure with hypoxia Secondary to fluid overload from missed dialysis. Patient initially required BiPAP which has been weaned. Patient improved with hemodialysis.  Third degree heart block Cardiology consulted. Initially betablocker was discontinued, however, patient continued to have heart block. PPM placed on 04/20/17. Outpatient follow-up with electrophysiology. Discontinue metoprolol.  Shock liver Secondary to fluid overload. Significant elevation of AST/ALT which continues to downtrend. GI consulted. AST/ALT improved over time. Recheck as an outpatient.  Elevated troponin Demand ischemia. Cardiology consulted. Transthoracic Echocardiogram significant for normal EF with LVH, biatrial enlargement, MR/TR and significant PAH. Plan for outpatient Myoview per cardiology.  Chronic pain -Continue gabapentin  Lactic acidosis Resolved.  Hyperkalemia Resolved with hemodialysis  ESRD MWF. Nephrology consulted for HD.  Essential hypertension Continue amlodipine and lisinopril. Metoprolol discontinued   Discharge Diagnoses:  Principal Problem:   Acute hypoxemic respiratory failure (HCC) Active Problems:   Anemia of chronic disease   DM (diabetes mellitus), type 2 with renal complications, diet controlled   Benign hypertension   Hyperlipidemia   ESRD on hemodialysis (HCC)   Heart block   Fluid overload   Elevated troponin    Discharge Instructions  Discharge Instructions    Diet - low sodium heart healthy   Complete by:  As directed    Increase activity slowly  Complete by:  As directed      Allergies as  of 04/23/2017      Reactions   Cardura [doxazosin Mesylate] Other (See Comments)   Hallucinations      Medication List    STOP taking these medications   acetaminophen 500 MG tablet Commonly known as:  TYLENOL   amoxicillin-clavulanate 500-125 MG tablet Commonly known as:  AUGMENTIN   diphenoxylate-atropine 2.5-0.025 MG tablet Commonly known as:  LOMOTIL   ibuprofen 200 MG tablet Commonly known as:  ADVIL,MOTRIN   LIPITOR 40 MG tablet Generic drug:  atorvastatin   metoprolol succinate 100 MG 24 hr tablet Commonly known as:  TOPROL-XL     TAKE these medications   allopurinol 100 MG tablet Commonly known as:  ZYLOPRIM Take 100 mg by mouth daily.   amLODipine 5 MG tablet Commonly known as:  NORVASC Take 5 mg by mouth at bedtime.   gabapentin 100 MG capsule Commonly known as:  NEURONTIN Take 100 mg by mouth at bedtime.   lisinopril 10 MG tablet Commonly known as:  PRINIVIL,ZESTRIL Take 10 mg by mouth at bedtime.   loperamide 2 MG tablet Commonly known as:  IMODIUM A-D Take 4 mg by mouth 4 (four) times daily as needed for diarrhea or loose stools.   multivitamin Tabs tablet Take 1 tablet by mouth daily.   ondansetron 8 MG disintegrating tablet Commonly known as:  ZOFRAN ODT Take 1 tablet (8 mg total) by mouth every 8 (eight) hours as needed for nausea or vomiting.   promethazine-dextromethorphan 6.25-15 MG/5ML syrup Commonly known as:  PROMETHAZINE-DM Take 5 mLs by mouth every 6 (six) hours as needed for cough.   SENSIPAR 30 MG tablet Generic drug:  cinacalcet Take 30 mg by mouth every Monday, Wednesday, and Friday with hemodialysis.   sevelamer carbonate 800 MG tablet Commonly known as:  RENVELA Take 1,600-2,400 mg by mouth See admin instructions. Take 3 tablets (2400 mg) by mouth 3 times daily with meals and 2 tablets (1600 mg)  with snacks   traMADol 50 MG tablet Commonly known as:  ULTRAM Take 1 tablet (50 mg total) by mouth every 12 (twelve) hours  as needed for moderate pain.   Vitamin D-3 5000 units Tabs Take 1,000 Units by mouth daily. What changed:  how much to take            Durable Medical Equipment  (From admission, onward)        Start     Ordered   04/23/17 1517  For home use only DME 4 wheeled rolling walker with seat  Once    Question:  Patient needs a walker to treat with the following condition  Answer:  ESRD (end stage renal disease) on dialysis ([facility])   04/23/17 1517   04/23/17 1454  For home use only DME Walker rolling  Once    Question:  Patient needs a walker to treat with the following condition  Answer:  Heart block   04/23/17 1454     Follow-up Information    [facility] Follow up on 04/30/2017.   Specialty:  Cardiology Why:  12:00PM (noon), wound check visit Contact information: [address] [place]       Evans Lance, MD Follow up on 07/19/2017.   Specialty:  Cardiology Why:  12:15PM Contact information: [address] [place] [address] [phone]        Seward Carol,  MD. Schedule an appointment as soon as possible for a visit in 1 week(s).   Specialty:  Internal Medicine Contact information: [address] [place] [address] [phone]          Allergies  Allergen Reactions  . Cardura [Doxazosin Mesylate] Other (See Comments)    Hallucinations    Consultations:  Nephrology  Cardiology  Electrophysiology  Gastroenterology   Procedures/Studies: Dg Chest 2 View  Result Date: 04/21/2017 CLINICAL DATA:  Status post pacer placement EXAM: CHEST  2 VIEW COMPARISON:  04/19/2017 FINDINGS: Cardiac shadow remains enlarged. Pacing device is now seen without evidence of pneumothorax. The lungs are well aerated bilaterally. Minimal basilar atelectasis is seen. No bony abnormality is noted. IMPRESSION: No pneumothorax following pacemaker placement. Minimal  bibasilar atelectasis is seen. Electronically Signed   By: Inez Catalina M.D.   On: 04/21/2017 08:49   Dg Chest 2 View  Result Date: 04/09/2017 CLINICAL DATA:  Shortness of breath and productive for 3 weeks EXAM: CHEST  2 VIEW COMPARISON:  02/28/2017, CT chest 05/15/2016 FINDINGS: There are small bilateral pleural effusions. Mild bibasilar opacity. Mild cardiomegaly with central vascular congestion. Aortic atherosclerosis. No pneumothorax. IMPRESSION: 1. Small bilateral pleural effusions with mild bibasilar atelectasis or infiltrates 2. Mild cardiomegaly with central vascular congestion. Electronically Signed   By: Donavan Foil M.D.   On: 04/09/2017 17:51   US Abdomen Complete  Result Date: 04/18/2017 CLINICAL DATA:  Elevated LFTs. EXAM: ABDOMEN ULTRASOUND COMPLETE COMPARISON:  Right upper quadrant ultrasound 03/01/2017. CT angiography 03/01/2017. FINDINGS: Gallbladder: Partially contracted containing stones and sludge. There is diffuse gallbladder wall thickening of 7 mm. No sonographic Husband sign noted by sonographer. Common bile duct: Diameter: 5 mm, normal. Liver: No focal lesion identified. Within normal limits in parenchymal echogenicity. Portal vein is patent on color Doppler imaging with normal direction of blood flow towards the liver. IVC: No abnormality visualized. Pancreas: Visualized portion unremarkable. Spleen: Size and appearance within normal limits. Minimal perisplenic fluid. Right Kidney: Length: 9.3 cm. Cortical thinning with increased renal echogenicity. Cyst in the upper kidney measures 2.4 cm. No hydronephrosis. Left Kidney: Length: 9.4 cm. Cortical thinning with increased renal echogenicity. Multiple small cysts largest measuring 1.9 and 2.1 cm in the mid kidney. No hydronephrosis. Abdominal aorta: Known infrarenal abdominal aortic aneurysm is not well visualized. Majority of the abdominal aorta is obscured by bowel gas. Other findings: None. IMPRESSION: 1. Partially contracted  gallbladder containing stones and sludge. Diffuse gallbladder wall thickening of 7 mm. Wall thickening may be in part due to nondistention. No sonographic Domanski sign. If there is clinical concern for acute cholecystitis, consider nuclear medicine HIDA scan. 2. Unremarkable sonographic appearance of the liver. No biliary dilatation. 3. Small echogenic kidneys with bilateral renal cysts consistent with chronic medical renal disease. 4. Trace perisplenic fluid. 5. Known infrarenal abdominal aortic aneurysm is obscured by bowel gas. Electronically Signed   By: Jeb Levering M.D.   On: 04/18/2017 00:07   Dg Chest Port 1 View  Result Date: 04/19/2017 CLINICAL DATA:  Shortness of Breath EXAM: PORTABLE CHEST 1 VIEW COMPARISON:  April 18, 2017, April 17, 2017, and April 09, 2017 FINDINGS: There is no edema or consolidation. There is cardiomegaly with pulmonary vascularity within normal limits. No adenopathy. No bone lesions. IMPRESSION: Stable cardiomegaly.  No appreciable edema or consolidation. Electronically Signed   By: Lowella Grip III M.D.   On: 04/19/2017 07:44   Dg Chest Port 1 View  Result Date: 04/18/2017 CLINICAL DATA:  Respiratory failure EXAM: PORTABLE CHEST 1 VIEW COMPARISON:  April 17, 2017 FINDINGS: Transcutaneous pacers overlie the medial left upper chest and the right hilar region. Stable cardiomegaly. The hila and mediastinum are unchanged. No pneumothorax. No nodules or masses. Mild atelectasis at the left base. No overt edema. IMPRESSION: Cardiomegaly and left basilar atelectasis.  No overt edema. Electronically Signed   By: Dorise Bullion III M.D   On: 04/18/2017 14:04   Dg Chest Port 1 View  Result Date: 04/17/2017 CLINICAL DATA:  Dyspnea EXAM: PORTABLE CHEST 1 VIEW COMPARISON:  04/09/2017 FINDINGS: Stable cardiomegaly with aortic atherosclerosis. Interval development of mild interstitial edema. Pleural effusions however not apparent on current exam. No pneumonic  consolidation. No acute osseous abnormality. IMPRESSION: 1. Stable cardiomegaly and aortic atherosclerosis. 2. Interval development of mild interstitial edema. Electronically Signed   By: Ashley Royalty M.D.   On: 04/17/2017 18:23     Transthoracic Echocardiogram (04/18/2017) Study Conclusions  - Left ventricle: The cavity size was normal. There was severe   concentric hypertrophy. Systolic function was vigorous. The   estimated ejection fraction was in the range of 65% to 70%. Wall   motion was normal; there were no regional wall motion   abnormalities. The study is not technically sufficient to allow   evaluation of LV diastolic function. - Aortic valve: Calcified with moderate stenosis. There was trivial   regurgitation. Mean gradient (S): 19 mm Hg. Peak gradient (S): 43   mm Hg. Valve area (VTI): 1.25 cm^2. Valve area (Vmax): 1.1 cm^2.   Valve area (Vmean): 1.22 cm^2. - Mitral valve: Thickened, sclerotic leaflets. MAC. Mild to   moderate regurgitation. - Left atrium: Severely dilated. - Right ventricle: The cavity size was mildly dilated. Systolic   function was normal. - Right atrium: Severely dilated. - Tricuspid valve: There was moderate regurgitation. - Pulmonary arteries: PA peak pressure: 72 mm Hg (S). - Inferior vena cava: The vessel was normal in size. The   respirophasic diameter changes were in the normal range (>= 50%),   consistent with normal central venous pressure.  Impressions:  - Compared to a prior study in 04/2016, the LVEF is higher at   65-70%. There is severe LVH and now moderate aortic stenosis with   a mean gradient of 19 mmHg (up from 10 mmHg). Severe biatrial   enlargement is noted with mild to moderate MR, moderate TR and   moderate to severe pulmonary hypertension and RVSP of 72 mmHg   with a normal sized IVC.   Subjective: No concerns overnight.  Discharge Exam: Vitals:   04/23/17 1136 04/23/17 1253  BP: 133/68 118/65  Pulse: 86 93  Resp:  18 16  Temp: 98 F (36.7 C) 98.3 F (36.8 C)  SpO2: 100% 100%   Vitals:   04/23/17 1030 04/23/17 1100 04/23/17 1136 04/23/17 1253  BP: 131/90 117/76 133/68 118/65  Pulse: 89 84 86 93  Resp: 18 20 18 16   Temp:   98 F (36.7 C) 98.3 F (36.8 C)  TempSrc:   Oral Oral  SpO2:   100% 100%  Weight:   85.4 kg (188 lb 4.4 oz)   Height:        General: Pt is alert, awake, not in acute distress Cardiovascular: RRR, S1/S2 +, no rubs, no gallops. 2/6 systolic murmur Respiratory: CTA bilaterally, no wheezing, no rhonchi Abdominal: Soft, NT, ND, bowel sounds + Extremities: no edema, no cyanosis    The results of significant diagnostics from this hospitalization (including  imaging, microbiology, ancillary and laboratory) are listed below for reference.     Microbiology: Recent Results (from the past 240 hour(s))  MRSA PCR Screening     Status: None   Collection Time: 04/17/17 10:22 PM  Result Value Ref Range Status   MRSA by PCR NEGATIVE NEGATIVE Final    Comment:        The GeneXpert MRSA Assay (FDA approved for NASAL specimens only), is one component of a comprehensive MRSA colonization surveillance program. It is not intended to diagnose MRSA infection nor to guide or monitor treatment for MRSA infections. Performed at [facility], [place] [address] [place], [place] [address]   Surgical PCR screen     Status: None   Collection Time: 04/19/17  6:38 AM  Result Value Ref Range Status   MRSA, PCR NEGATIVE NEGATIVE Final   Staphylococcus aureus NEGATIVE NEGATIVE Final    Comment: (NOTE) The Xpert SA Assay (FDA approved for NASAL specimens in patients 38 years of age and older), is one component of a comprehensive surveillance program. It is not intended to diagnose infection nor to guide or monitor treatment. Performed at [facility], [place] [address], [place], [place] [address]      Labs: BNP (last 3 results) Recent Labs    04/17/17 1800  BNP 2,812.2*    Basic Metabolic Panel: Recent Labs  Lab 04/17/17 1800 04/17/17 2249 04/18/17 0222  04/18/17 1437 04/19/17 0206 04/20/17 0236 04/21/17 0253 04/22/17 0219 04/23/17 0336  NA 141 140 143   < > 138 137 136 138 136 135  K 5.8* 5.9* 6.9*   < > 4.8 4.9 3.5 3.7 3.6 3.9  CL 97* 96* 99*   < > 98* 96* 94* 94* 96* 95*  CO2 20* 18* 17*   < > 23 25 27 27 26 24   GLUCOSE 101* 91 97   < > 92 87 89 86 94 93  BUN 74* 77* 82*   < > 36* 46* 36* 49* 23* 35*  CREATININE 15.52* 15.60* 16.02*   < > 8.32* 9.80* 7.53* 9.91* 6.26* 8.50*  CALCIUM 9.4 9.3 9.0   < > 8.7* 8.7* 8.7* 8.9 8.9 9.3  MG 3.0*  --  3.2*  --  2.5*  --   --   --   --   --   PHOS  --  8.4*  --   --  4.8*  --   --   --   --   --    < > = values in this interval not displayed.   Liver Function Tests: Recent Labs  Lab 04/19/17 0206 04/20/17 0236 04/21/17 0253 04/22/17 0219 04/23/17 0336  AST 1,965* 665* 379* 223* 115*  ALT 2,349* 1,571* 896* 308* 76*  ALKPHOS 61 68 63 65 68  BILITOT 1.5* 1.4* 0.8 0.5 0.6  PROT 5.9* 6.1* 6.1* 6.3* 6.6  ALBUMIN 2.8* 2.9* 3.0* 3.0* 3.2*   No results for input(s): LIPASE, AMYLASE in the last 168 hours. No results for input(s): AMMONIA in the last 168 hours. CBC: Recent Labs  Lab 04/17/17 1800  04/19/17 0206 04/20/17 0236 04/21/17 0253 04/22/17 0219 04/23/17 0336  WBC 10.8*   < > 11.8* 10.7* 8.0 9.6 9.1  NEUTROABS 8.0*  --   --   --   --   --   --   HGB 9.0*   < > 8.4* 8.9* 9.1* 9.5* 10.0*  HCT 29.0*   < > 26.9* 28.6* 29.1*  30.5* 31.9*  MCV 96.3   < > 96.4 96.9 97.0 98.1 97.6  PLT 349   < > 312 358 340 331 311   < > = values in this interval not displayed.   Cardiac Enzymes: Recent Labs  Lab 04/17/17 1800 04/17/17 2249 04/18/17 0222 04/18/17 0852 04/18/17 1437  TROPONINI 0.22* 0.20* 0.22* 0.35* 0.39*   BNP: Invalid input(s): POCBNP CBG: Recent Labs  Lab 04/18/17 0239  GLUCAP 79   D-Dimer No results for input(s): DDIMER in the last 72 hours. Hgb A1c No results for  input(s): HGBA1C in the last 72 hours. Lipid Profile No results for input(s): CHOL, HDL, LDLCALC, TRIG, CHOLHDL, LDLDIRECT in the last 72 hours. Thyroid function studies No results for input(s): TSH, T4TOTAL, T3FREE, THYROIDAB in the last 72 hours.  Invalid input(s): FREET3 Anemia work up No results for input(s): VITAMINB12, FOLATE, FERRITIN, TIBC, IRON, RETICCTPCT in the last 72 hours. Urinalysis    Component Value Date/Time   COLORURINE YELLOW 12/29/2014 0550   APPEARANCEUR CLOUDY (A) 12/29/2014 0550   LABSPEC 1.020 12/29/2014 0550   PHURINE 5.0 12/29/2014 0550   GLUCOSEU NEGATIVE 12/29/2014 0550   HGBUR SMALL (A) 12/29/2014 0550   BILIRUBINUR NEGATIVE 12/29/2014 0550   KETONESUR NEGATIVE 12/29/2014 0550   PROTEINUR 100 (A) 12/29/2014 0550   UROBILINOGEN 0.2 12/29/2014 0550   NITRITE NEGATIVE 12/29/2014 0550   LEUKOCYTESUR SMALL (A) 12/29/2014 0550   Sepsis Labs Invalid input(s): PROCALCITONIN,  WBC,  LACTICIDVEN Microbiology Recent Results (from the past 240 hour(s))  MRSA PCR Screening     Status: None   Collection Time: 04/17/17 10:22 PM  Result Value Ref Range Status   MRSA by PCR NEGATIVE NEGATIVE Final    Comment:        The GeneXpert MRSA Assay (FDA approved for NASAL specimens only), is one component of a comprehensive MRSA colonization surveillance program. It is not intended to diagnose MRSA infection nor to guide or monitor treatment for MRSA infections. Performed at [facility], [place] [address] [place], [place] [address]   Surgical PCR screen     Status: None   Collection Time: 04/19/17  6:38 AM  Result Value Ref Range Status   MRSA, PCR NEGATIVE NEGATIVE Final   Staphylococcus aureus NEGATIVE NEGATIVE Final    Comment: (NOTE) The Xpert SA Assay (FDA approved for NASAL specimens in patients 25 years of age and older), is one component of a comprehensive surveillance program. It is not intended to diagnose infection nor to guide or monitor  treatment. Performed at [facility], [place] [address], [place], [place] [address]      Time coordinating discharge: Over 30 minutes  SIGNED:   Cordelia Poche, MD [facility] 04/23/2017, 2:54 PM Pager [phone]  If 7PM-7AM, please contact night-coverage [email or web address] Password [email or web address]

## 2017-04-23 NOTE — Progress Notes (Signed)
Physical Therapy Treatment Patient Details Name: Sean Best MRN: [id number] DOB: 1933-07-06 Today's Date: 04/23/2017    History of Present Illness Pt is an 84 y.o. male admitted 04/17/17 with dyspnea; found to be in fluid overload secondary to HD non-adherence. Found to have AV block; now s/p pacemaker implant 2/19. PMH includes ESRD (HD MWF), HTN, CVA (1998), blind L eye, arthritis.    PT Comments    Pt progressing with mobility. Able to ambulate with RW and supervision for safety; intermittent cues to maintain pacemaker precautions. From a mobility perspective, feel pt is safe to return home with initial supervision from daughter and HHPT services. Would benefit from a rollator for added stability and energy conservation. Will follow acutely if pt remains admitted.   Follow Up Recommendations  Home health PT;Supervision - Intermittent     Equipment Recommendations  (rollator)    Recommendations for Other Services       Precautions / Restrictions Precautions Precautions: Fall;ICD/Pacemaker Restrictions Weight Bearing Restrictions: No    Mobility  Bed Mobility               General bed mobility comments: Received sitting in recliner  Transfers Overall transfer level: Needs assistance Equipment used: Rolling walker (2 wheeled) Transfers: Sit to/from Stand Sit to Stand: Supervision         General transfer comment: Cues to decrease WB through UE when pushing into standing  Ambulation/Gait Ambulation/Gait assistance: Supervision Ambulation Distance (Feet): 250 Feet Assistive device: Rolling walker (2 wheeled) Gait Pattern/deviations: Step-through pattern;Decreased stride length Gait velocity: Decreased Gait velocity interpretation: <1.8 ft/sec, indicative of risk for recurrent falls General Gait Details: Very slow, but controlled amb with RW and supervision for safety. HR up to 101 with ambulation   Stairs            Wheelchair Mobility    Modified  Rankin (Stroke Patients Only)       Balance Overall balance assessment: Needs assistance   Sitting balance-Leahy Scale: Good       Standing balance-Leahy Scale: Fair Standing balance comment: Can static stand with no UE support                            Cognition Arousal/Alertness: Awake/alert Behavior During Therapy: WFL for tasks assessed/performed Overall Cognitive Status: Within Functional Limits for tasks assessed                                        Exercises      General Comments        Pertinent Vitals/Pain Pain Assessment: No/denies pain    Home Living                      Prior Function            PT Goals (current goals can now be found in the care plan section) Progress towards PT goals: Progressing toward goals    Frequency    Min 3X/week      PT Plan Current plan remains appropriate    Co-evaluation              AM-PAC PT "6 Clicks" Daily Activity  Outcome Measure  Difficulty turning over in bed (including adjusting bedclothes, sheets and blankets)?: None Difficulty moving from lying on back to sitting on the side of the bed? :  A Little Difficulty sitting down on and standing up from a chair with arms (e.g., wheelchair, bedside commode, etc,.)?: A Little Help needed moving to and from a bed to chair (including a wheelchair)?: A Little Help needed walking in hospital room?: A Little Help needed climbing 3-5 steps with a railing? : A Little 6 Click Score: 19    End of Session Equipment Utilized During Treatment: Gait belt Activity Tolerance: Patient tolerated treatment well Patient left: in chair;with call bell/phone within reach Nurse Communication: Mobility status PT Visit Diagnosis: Other abnormalities of gait and mobility (R26.89)     Time: 1188-6773 PT Time Calculation (min) (ACUTE ONLY): 20 min  Charges:  $Gait Training: 8-22 mins                    G Codes:      Mabeline Caras, PT, DPT Acute Rehab Services  Pager: [place] 04/23/2017, 3:03 PM

## 2017-04-23 NOTE — Progress Notes (Signed)
[facility] PROGRESS NOTE Subjective patient just returned from dialysis. He is feeling well.  Objective: Vital signs in last 24 hours: Temp:  [97.9 F (36.6 C)-98.7 F (37.1 C)] 98.3 F (36.8 C) (02/22 1253) Pulse Rate:  [80-93] 93 (02/22 1253) Resp:  [15-21] 16 (02/22 1253) BP: (117-171)/(65-104) 118/65 (02/22 1253) SpO2:  [96 %-100 %] 100 % (02/22 1253) Weight:  [85.4 kg (188 lb 4.4 oz)-87.4 kg (192 lb 10.9 oz)] 85.4 kg (188 lb 4.4 oz) (02/22 1136) Last BM Date: 04/19/17  Intake/Output from previous day: 02/21 0701 - 02/22 0700 In: 480 [P.O.:480] Out: -  Intake/Output this shift: Total I/O In: 240 [P.O.:240] Out: 2000 [Other:2000]    Lab Results: Recent Labs    04/21/17 0253 04/22/17 0219 04/23/17 0336  WBC 8.0 9.6 9.1  HGB 9.1* 9.5* 10.0*  HCT 29.1* 30.5* 31.9*  PLT 340 331 311   BMET Recent Labs    04/21/17 0253 04/22/17 0219 04/23/17 0336  NA 138 136 135  K 3.7 3.6 3.9  CL 94* 96* 95*  CO2 27 26 24   CREATININE 9.91* 6.26* 8.50*   LFT Recent Labs    04/21/17 0253 04/22/17 0219 04/23/17 0336  PROT 6.1* 6.3* 6.6  AST 379* 223* 115*  ALT 896* 308* 76*  ALKPHOS 63 65 68  BILITOT 0.8 0.5 0.6   PT/INR No results for input(s): LABPROT, INR in the last 72 hours. PANCREAS No results for input(s): LIPASE in the last 72 hours.       Studies/Results: No results found.  Medications: I have reviewed the patient's current medications.  Assessment:   1. Abnormal LFTs. Probably due to shock liver. Transaminases now back to normal. Hepatitis C, a, and be all negative. Don't feel he needs any further diagnostic test.   Plan: we will sign off. Please call us back in we can be of any further help. I have discussed these good findings with the patient.   Nancy Fetter 04/23/2017, 2:14 PM  This note was created using voice recognition software. Minor errors may Have occurred unintentionally.  Pager: [phone] If no answer or  after hours call [phone]

## 2017-04-23 NOTE — Evaluation (Signed)
Occupational Therapy Evaluation and Discharge from OT Patient Details Name: Sean Best MRN: [id number] DOB: March 23, 1933 Today's Date: 04/23/2017    History of Present Illness Pt is an 84 y.o. male admitted 04/17/17 with dyspnea; found to be in fluid overload secondary to HD non-adherence. Found to have AV block; now s/p pacemaker implant 2/19. PMH includes ESRD (HD MWF), HTN, CVA (1998), blind L eye, arthritis.    Clinical Impression   PTA Pt independent in ADL and mobility with RW/SPC. Pt is currently min A for dressing UB but otherwise supervision for ADL. Pt educated in compensatory strategies to maintain pacemaker precautions during ADL - Pt verbalized understanding. Pt very pleasant and education complete. Thank you for the opportunity to serve this patient. Pt will have 24 hour assist from daughter at DC. OT to sign off at this time.     Follow Up Recommendations  No OT follow up;Supervision - Intermittent    Equipment Recommendations  None recommended by OT    Recommendations for Other Services       Precautions / Restrictions Precautions Precautions: Fall;ICD/Pacemaker Restrictions Weight Bearing Restrictions: No      Mobility Bed Mobility               General bed mobility comments: Received sitting in recliner  Transfers Overall transfer level: Needs assistance Equipment used: Rolling walker (2 wheeled) Transfers: Sit to/from Stand Sit to Stand: Supervision         General transfer comment: good carryover from previous PT session    Balance Overall balance assessment: Needs assistance Sitting-balance support: No upper extremity supported;Feet supported Sitting balance-Leahy Scale: Good Sitting balance - Comments: demonstrates access to LB by crossing feet to knees     Standing balance-Leahy Scale: Fair Standing balance comment: Can static stand with no UE support                           ADL either performed or assessed with clinical  judgement   ADL Overall ADL's : Needs assistance/impaired                                       General ADL Comments: Pt overall at supervision level for ADL with RW for balance as needed- Pt educated in compensatory strategies for ADL while maintaining pacemaker precautions. Pt verbalized understanding.      Vision         Perception     Praxis      Pertinent Vitals/Pain Pain Assessment: No/denies pain Pain Intervention(s): Monitored during session     Hand Dominance     Extremity/Trunk Assessment Upper Extremity Assessment Upper Extremity Assessment: Generalized weakness   Lower Extremity Assessment Lower Extremity Assessment: Overall WFL for tasks assessed       Communication Communication Communication: No difficulties   Cognition Arousal/Alertness: Awake/alert Behavior During Therapy: WFL for tasks assessed/performed Overall Cognitive Status: Within Functional Limits for tasks assessed                                     General Comments       Exercises     Shoulder Instructions      Home Living Family/patient expects to be discharged to:: Private residence Living Arrangements: Alone Available Help at Discharge: Family;Available 24 hours/day(Daughter) Type  of Home: House Home Access: Ramped entrance     Home Layout: One level     Bathroom Shower/Tub: Teacher, early years/pre: Standard     Home Equipment: Cane - single point   Additional Comments: Daughter can stay with pt for a few days at d/c       Prior Functioning/Environment Level of Independence: Independent with assistive device(s)        Comments: Indep with mobility using RW in house and [facility] in community. Does not drive; friends transports him to HD. Daughter checks in on pt as needed        OT Problem List:        OT Treatment/Interventions:      OT Goals(Current goals can be found in the care plan section) Acute Rehab OT  Goals Patient Stated Goal: Return home OT Goal Formulation: With patient Time For Goal Achievement: 04/29/17 Potential to Achieve Goals: Good  OT Frequency:     Barriers to D/C:            Co-evaluation              AM-PAC PT "6 Clicks" Daily Activity     Outcome Measure Help from another person eating meals?: None Help from another person taking care of personal grooming?: None Help from another person toileting, which includes using toliet, bedpan, or urinal?: None Help from another person bathing (including washing, rinsing, drying)?: A Little Help from another person to put on and taking off regular upper body clothing?: A Little Help from another person to put on and taking off regular lower body clothing?: None 6 Click Score: 22   End of Session Equipment Utilized During Treatment: Gait belt;Rolling walker  Activity Tolerance: Patient tolerated treatment well Patient left: in chair;with call bell/phone within reach                   Time: 1523-1539 OT Time Calculation (min): 16 min Charges:  OT General Charges $OT Visit: 1 Visit OT Evaluation $OT Eval Moderate Complexity: 1 Mod G-Codes:     Hulda Humphrey OTR/L [phone]  [place] Renate Danh 04/23/2017, 5:32 PM

## 2017-04-23 NOTE — Care Management Note (Addendum)
Case Management Note  Patient Details  Name: Sean Best MRN: [id number] Date of Birth: 05-26-1933  Subjective/Objective:   Dyspnea, fluid overload, s/t HD non-adherence, s/p pacemaker on 04/20/2017                Action/Plan: Please see previous NCM notes. Pt [address] arrange with [facility]. Contacted [facility] to make aware of dc home today with HH. Contacted [facility] for RW with seat for home.   Expected Discharge Date:  04/23/17               Expected Discharge Plan:  [place]  In-House Referral:  NA  Discharge planning Services  CM Consult  Post Acute Care Choice:  Home Health Choice offered to:  Patient  DME Arranged:  Walker rolling DME Agency:  [place] Arranged:  RN, PT [facility] Agency:  [facility]  Status of Service:  Completed, signed off  If discussed at [place] of Stay Meetings, dates discussed:    Additional Comments:  Erenest Rasher, RN 04/23/2017, 3:06 PM

## 2017-04-23 NOTE — Discharge Instructions (Signed)
° ° °  Supplemental Discharge Instructions for  Pacemaker/Defibrillator Patients  Activity No heavy lifting or vigorous activity with your left/right arm for 6 to 8 weeks.  Do not raise your left/right arm above your head for one week.  Gradually raise your affected arm as drawn below.              04/24/17                    04/25/17                     04/26/17                  04/27/17 __  NO DRIVING for  1 week   ; you may begin driving on   0/17/51  .  WOUND CARE - Keep the wound area clean and dry.  Do not get this area wet for one week. No showers for one week; you may shower on  04/27/17  . - The tape/steri-strips on your wound will fall off; do not pull them off.  No bandage is needed on the site.  DO  NOT apply any creams, oils, or ointments to the wound area. - If you notice any drainage or discharge from the wound, any swelling or bruising at the site, or you develop a fever > 101? F after you are discharged home, call the office at once.  Special Instructions - You are still able to use cellular telephones; use the ear opposite the side where you have your pacemaker/defibrillator.  Avoid carrying your cellular phone near your device. - When traveling through airports, show security personnel your identification card to avoid being screened in the metal detectors.  Ask the security personnel to use the hand wand. - Avoid arc welding equipment, MRI testing (magnetic resonance imaging), TENS units (transcutaneous nerve stimulators).  Call the office for questions about other devices. - Avoid electrical appliances that are in poor condition or are not properly grounded. - Microwave ovens are safe to be near or to operate.  Additional information for defibrillator patients should your device go off: - If your device goes off ONCE and you feel fine afterward, notify the device clinic nurses. - If your device goes off ONCE and you do not feel well afterward, call 911. - If your device goes  off TWICE, call 911. - If your device goes off THREE times in one day, call 911.  DO NOT DRIVE YOURSELF OR A FAMILY MEMBER WITH A DEFIBRILLATOR TO THE HOSPITAL--CALL 911.

## 2017-04-23 NOTE — Progress Notes (Signed)
IV removed; pt belongings collected; Tramadol called into [facility]; educated on d/c instructions; daughter here; will wheel pt out to vehicle when dressed. Will continue to monitor.  [place]  Jazzlin Clements, RN

## 2017-04-23 NOTE — Progress Notes (Signed)
[facility] Progress Note   Subjective: no new c/o  Objective Vitals:   04/23/17 0930 04/23/17 1000 04/23/17 1030 04/23/17 1100  BP: (!) 141/82 127/83 131/90 117/76  Pulse: 80 86 89 84  Resp: 15 19 18 20   Temp:      TempSrc:      SpO2:      Weight:      Height:       Physical Exam General: Pleasant elderly male in NAD Heart: S1,S2 no M/R/G Lungs: CTAB Abdomen: active BS  Extremities: no LE edema Dialysis Access: L AVF blood lines connected  Additional Objective Labs: Basic Metabolic Panel: Recent Labs  Lab 04/17/17 2249  04/18/17 1437  04/21/17 0253 04/22/17 0219 04/23/17 0336  NA 140   < > 138   < > 138 136 135  K 5.9*   < > 4.8   < > 3.7 3.6 3.9  CL 96*   < > 98*   < > 94* 96* 95*  CO2 18*   < > 23   < > 27 26 24   GLUCOSE 91   < > 92   < > 86 94 93  BUN 77*   < > 36*   < > 49* 23* 35*  CREATININE 15.60*   < > 8.32*   < > 9.91* 6.26* 8.50*  CALCIUM 9.3   < > 8.7*   < > 8.9 8.9 9.3  PHOS 8.4*  --  4.8*  --   --   --   --    < > = values in this interval not displayed.   Liver Function Tests: Recent Labs  Lab 04/21/17 0253 04/22/17 0219 04/23/17 0336  AST 379* 223* 115*  ALT 896* 308* 76*  ALKPHOS 63 65 68  BILITOT 0.8 0.5 0.6  PROT 6.1* 6.3* 6.6  ALBUMIN 3.0* 3.0* 3.2*   No results for input(s): LIPASE, AMYLASE in the last 168 hours. CBC: Recent Labs  Lab 04/17/17 1800  04/19/17 0206 04/20/17 0236 04/21/17 0253 04/22/17 0219 04/23/17 0336  WBC 10.8*   < > 11.8* 10.7* 8.0 9.6 9.1  NEUTROABS 8.0*  --   --   --   --   --   --   HGB 9.0*   < > 8.4* 8.9* 9.1* 9.5* 10.0*  HCT 29.0*   < > 26.9* 28.6* 29.1* 30.5* 31.9*  MCV 96.3   < > 96.4 96.9 97.0 98.1 97.6  PLT 349   < > 312 358 340 331 311   < > = values in this interval not displayed.   Blood Culture    Component Value Date/Time   SDES BLOOD RIGHT HAND 12/28/2014 1725   SPECREQUEST BOTTLES DRAWN AEROBIC ONLY 6CC 12/28/2014 1725   CULT NO GROWTH 5 DAYS 12/28/2014 1725   REPTSTATUS 01/02/2015 FINAL 12/28/2014 1725    Cardiac Enzymes: Recent Labs  Lab 04/17/17 1800 04/17/17 2249 04/18/17 0222 04/18/17 0852 04/18/17 1437  TROPONINI 0.22* 0.20* 0.22* 0.35* 0.39*   CBG: Recent Labs  Lab 04/18/17 0239  GLUCAP 79   Iron Studies: No results for input(s): IRON, TIBC, TRANSFERRIN, FERRITIN in the last 72 hours. @lablastinr3 @ Studies/Results: No results found. Medications: . sodium chloride    . sodium chloride     . allopurinol  100 mg Oral Daily  . amLODipine  5 mg Oral QHS  . cholecalciferol  1,000 Units Oral Daily  . cinacalcet  30 mg Oral Q M,W,F-HD  . darbepoetin (ARANESP) injection -  DIALYSIS  40 mcg Intravenous Q Wed-HD  . gabapentin  100 mg Oral QHS  . heparin  5,000 Units Subcutaneous Q8H  . heparin  100 Units/kg Dialysis Once in dialysis  . lisinopril  10 mg Oral QHS  . sevelamer carbonate  2,400 mg Oral TID WC  . sodium chloride flush  3 mL Intravenous Q12H   HD orders: [facility] MWF 4h   2/2.5  Hep 6000   92.5kg -Sensipar 120 mg PO TIW -Mircera 50 mcg IV q 2 weeks (Last dose 03/31/17) -Calcitriol 2.0 mcg PO TIW   Assessment/Plan: 1. S/P PPM 2/19 for second degree AV block 2. ESRD -MWF HD. HD today, lower edw at dc 3. Anemia - HGB 9.1 No ESA since adm. Give Aranesp 40 mcg IV today.  4. Secondary hyperparathyroidism - cont binders, sensipar, VDRA.  5. HTN/volume - UFG 2.8 Blood pressure stable. Under OP EDW. Lower EDW on DC.  6. Nutrition - Albumin 3.0 renal/carb mod diet nepro renal vits 7. DM Per primary     Kelly Splinter MD [place] pager [phone]   04/23/2017, 11:32 AM

## 2017-04-30 ENCOUNTER — Ambulatory Visit: Payer: Medicare Other

## 2017-04-30 DIAGNOSIS — L299 Pruritus, unspecified: Secondary | ICD-10-CM | POA: Insufficient documentation

## 2017-04-30 DIAGNOSIS — R509 Fever, unspecified: Secondary | ICD-10-CM | POA: Insufficient documentation

## 2017-04-30 DIAGNOSIS — D509 Iron deficiency anemia, unspecified: Secondary | ICD-10-CM | POA: Insufficient documentation

## 2017-04-30 DIAGNOSIS — E1151 Type 2 diabetes mellitus with diabetic peripheral angiopathy without gangrene: Secondary | ICD-10-CM | POA: Insufficient documentation

## 2017-04-30 DIAGNOSIS — D688 Other specified coagulation defects: Secondary | ICD-10-CM | POA: Insufficient documentation

## 2017-04-30 DIAGNOSIS — R0602 Shortness of breath: Secondary | ICD-10-CM | POA: Insufficient documentation

## 2017-05-03 DIAGNOSIS — D631 Anemia in chronic kidney disease: Secondary | ICD-10-CM | POA: Insufficient documentation

## 2017-05-03 DIAGNOSIS — N189 Chronic kidney disease, unspecified: Secondary | ICD-10-CM | POA: Insufficient documentation

## 2017-05-05 ENCOUNTER — Ambulatory Visit (INDEPENDENT_AMBULATORY_CARE_PROVIDER_SITE_OTHER): Payer: Medicare Other | Admitting: *Deleted

## 2017-05-05 DIAGNOSIS — I459 Conduction disorder, unspecified: Secondary | ICD-10-CM | POA: Diagnosis not present

## 2017-05-05 DIAGNOSIS — Z950 Presence of cardiac pacemaker: Secondary | ICD-10-CM

## 2017-05-05 LAB — CUP PACEART INCLINIC DEVICE CHECK
Battery Remaining Longevity: 54 mo
Brady Statistic AP VP Percent: 0.47 %
Brady Statistic AP VS Percent: 0 %
Brady Statistic AS VP Percent: 97.03 %
Brady Statistic RA Percent Paced: 0.47 %
Brady Statistic RV Percent Paced: 97.5 %
Implantable Lead Implant Date: 20190219
Implantable Lead Location: 753859
Implantable Lead Location: 753860
Implantable Lead Model: 3830
Implantable Lead Model: 5076
Lead Channel Impedance Value: 323 Ohm
Lead Channel Impedance Value: 513 Ohm
Lead Channel Pacing Threshold Pulse Width: 0.4 ms
Lead Channel Sensing Intrinsic Amplitude: 16 mV
Lead Channel Sensing Intrinsic Amplitude: 16.875 mV
Lead Channel Setting Pacing Amplitude: 4 V
Lead Channel Setting Pacing Pulse Width: 1 ms
Lead Channel Setting Sensing Sensitivity: 1.2 mV
MDC IDC LEAD IMPLANT DT: 20190219
MDC IDC MSMT BATTERY VOLTAGE: 3.12 V
MDC IDC MSMT LEADCHNL RA IMPEDANCE VALUE: 418 Ohm
MDC IDC MSMT LEADCHNL RA PACING THRESHOLD AMPLITUDE: 0.75 V
MDC IDC MSMT LEADCHNL RA SENSING INTR AMPL: 6.75 mV
MDC IDC MSMT LEADCHNL RA SENSING INTR AMPL: 7.25 mV
MDC IDC MSMT LEADCHNL RV IMPEDANCE VALUE: 399 Ohm
MDC IDC MSMT LEADCHNL RV PACING THRESHOLD AMPLITUDE: 0.75 V
MDC IDC MSMT LEADCHNL RV PACING THRESHOLD PULSEWIDTH: 1 ms
MDC IDC PG IMPLANT DT: 20190219
MDC IDC SESS DTM: 20190306145002
MDC IDC SET LEADCHNL RA PACING AMPLITUDE: 3.5 V
MDC IDC STAT BRADY AS VS PERCENT: 2.5 %

## 2017-05-05 NOTE — Progress Notes (Signed)
Wound check appointment s/p MDT PPM (HIS RV lead) on 04/20/17 by Dr. Lovena Le. Steri-strips removed from right chest incision. Wound without redness or edema. Incision edges approximated, wound well healed. Normal device function. Thresholds, sensing, and impedances consistent with implant measurements. Device programmed at 3.5V in RA, 4V @ 69ms in RV for extra safety margin until 3 month visit. RV output changed from 5V@1ms  to 4V @ 32ms, RV capture management trend shows no thresholds measured above 0.75V @ 0.37ms. Rhythm strip obtained during RV threshold testing. Histogram distribution appropriate for patient and level of activity- majority of HR 90-110bpm. No mode switches or high ventricular rates noted. Patient educated about wound care, arm mobility, lifting restrictions and Carelink monitoring. ROV with GT 07/19/17.

## 2017-05-21 ENCOUNTER — Encounter (HOSPITAL_COMMUNITY): Payer: Medicare Other

## 2017-05-21 ENCOUNTER — Ambulatory Visit: Payer: Medicare Other | Admitting: Vascular Surgery

## 2017-05-26 ENCOUNTER — Other Ambulatory Visit: Payer: Self-pay | Admitting: Internal Medicine

## 2017-06-29 ENCOUNTER — Encounter: Payer: Self-pay | Admitting: Internal Medicine

## 2017-07-19 ENCOUNTER — Encounter: Payer: Medicare Other | Admitting: Internal Medicine

## 2017-08-10 ENCOUNTER — Ambulatory Visit: Payer: Medicare Other | Admitting: Internal Medicine

## 2017-08-10 ENCOUNTER — Encounter: Payer: Self-pay | Admitting: Internal Medicine

## 2017-08-10 VITALS — BP 162/94 | HR 113 | Ht 65.0 in | Wt 198.0 lb

## 2017-08-10 DIAGNOSIS — Z950 Presence of cardiac pacemaker: Secondary | ICD-10-CM | POA: Diagnosis not present

## 2017-08-10 DIAGNOSIS — I459 Conduction disorder, unspecified: Secondary | ICD-10-CM

## 2017-08-10 NOTE — Progress Notes (Addendum)
HPI [name] returns today for followup. He is a pleasant 84 yo man with intermittent CHB, s/p PM insertion, ESRD on HD for years, HTN, and chronic diastolic heart failure. The patient c/o HD taking too long. No edema. No sob. He is fairly sedentary.  Allergies  Allergen Reactions  . Cardura [Doxazosin Mesylate] Other (See Comments)    Hallucinations     Current Outpatient Medications  Medication Sig Dispense Refill  . allopurinol (ZYLOPRIM) 100 MG tablet Take 100 mg by mouth daily.      Marland Kitchen amLODipine (NORVASC) 5 MG tablet Take 5 mg by mouth at bedtime.  9  . atorvastatin (LIPITOR) 40 MG tablet Take 40 mg by mouth daily.  3  . Cholecalciferol (VITAMIN D-3) 5000 units TABS Take 1,000 Units by mouth daily.    Marland Kitchen gabapentin (NEURONTIN) 100 MG capsule Take 100 mg by mouth at bedtime.  10  . lisinopril (PRINIVIL,ZESTRIL) 10 MG tablet Take 10 mg by mouth at bedtime.  3  . loperamide (IMODIUM A-D) 2 MG tablet Take 4 mg by mouth 4 (four) times daily as needed for diarrhea or loose stools.    . metoprolol succinate (TOPROL-XL) 100 MG 24 hr tablet Take 100 mg by mouth at bedtime.  3  . multivitamin (RENA-VIT) TABS tablet Take 1 tablet by mouth daily.    . SENSIPAR 30 MG tablet Take 30 mg by mouth every Monday, Wednesday, and Friday with hemodialysis.     Marland Kitchen sevelamer carbonate (RENVELA) 800 MG tablet Take 1,600-2,400 mg by mouth See admin instructions. Take 3 tablets (2400 mg) by mouth 3 times daily with meals and 2 tablets (1600 mg)  with snacks     No current facility-administered medications for this visit.      Past Medical History:  Diagnosis Date  . Anemia   . Arthritis    "knees" (04/11/2016)  . Blind left eye    S/P trauma  . Chronic total occlusion of artery of the extremities (HCC)    pt not aware of this  . Claudication ([place])   . ESRD on dialysis [facility])    "MWF; [name]" ((04/10/2016)  . ESRD on peritoneal dialysis [facility])    Started dialysis around April 2015 per  son.  Has been doing peritoneal dialysis at home.     . Gout   . Hyperlipidemia   . Hypertension   . Overweight(278.02)   . Prostate cancer ([place]) 1990s  . Shingles   . Stroke [facility]) ~1998   denies residual on 04/11/2016  . Type II diabetes mellitus (HCC)     ROS:   All systems reviewed and negative except as noted in the HPI.   Past Surgical History:  Procedure Laterality Date  . AV FISTULA PLACEMENT, RADIOCEPHALIC  89/37/3428   Left arm  . CAPD INSERTION N/A 03/06/2013   Procedure: LAPAROSCOPIC INSERTION CONTINUOUS AMBULATORY PERITONEAL DIALYSIS  (CAPD) CATHETER;  Surgeon: Edward Jolly, MD;  Location: [place];  Service: General;  Laterality: N/A;  . CATARACT EXTRACTION Right   . EYE SURGERY Left 1970s   for eye injury  . HERNIA REPAIR    . INSERTION PROSTATE RADIATION SEED    . PACEMAKER IMPLANT N/A 04/20/2017   Procedure: PACEMAKER IMPLANT;  Surgeon: Evans Lance, MD;  Location: [place] CV LAB;  Service: Cardiovascular;  Laterality: N/A;  . UMBILICAL HERNIA REPAIR N/A 03/06/2013   Procedure: HERNIA REPAIR UMBILICAL WITH MESH;  Surgeon: Edward Jolly, MD;  Location: [facility] OR;  Service: General;  Laterality: N/A;     Family History  Problem Relation Age of Onset  . Cancer Mother   . Heart disease Mother   . Cancer Father      Social History   Socioeconomic History  . Marital status: Widowed    Spouse name: Not on file  . Number of children: Not on file  . Years of education: Not on file  . Highest education level: Not on file  Occupational History  . Not on file  Social Needs  . Financial resource strain: Not on file  . Food insecurity:    Worry: Not on file    Inability: Not on file  . Transportation needs:    Medical: Not on file    Non-medical: Not on file  Tobacco Use  . Smoking status: Never Smoker  . Smokeless tobacco: Never Used  Substance and Sexual Activity  . Alcohol use: No  . Drug use: No  . Sexual activity: Never  Lifestyle  .  Physical activity:    Days per week: Not on file    Minutes per session: Not on file  . Stress: Not on file  Relationships  . Social connections:    Talks on phone: Not on file    Gets together: Not on file    Attends religious service: Not on file    Active member of club or organization: Not on file    Attends meetings of clubs or organizations: Not on file    Relationship status: Not on file  . Intimate partner violence:    Fear of current or ex partner: Not on file    Emotionally abused: Not on file    Physically abused: Not on file    Forced sexual activity: Not on file  Other Topics Concern  . Not on file  Social History Narrative  . Not on file     BP (!) 162/94   Pulse (!) 113   Ht 5\' 5"  (1.651 m)   Wt 198 lb (89.8 kg)   SpO2 99%   BMI 32.95 kg/m   Physical Exam:  Well appearing 84 yo man, NAD HEENT: Unremarkable Neck:  6 cm JVD, no thyromegally Lymphatics:  No adenopathy Back:  No CVA tenderness Lungs:  Clear with no wheezes, well healed PPM incision.  HEART:  Regular rate rhythm, no murmurs, no rubs, no clicks Abd:  soft, positive bowel sounds, no organomegally, no rebound, no guarding Ext:  2 plus pulses, no edema, no cyanosis, no clubbing, left arm with multiple dilated veins from HD sites Skin:  No rashes no nodules Neuro:  CN II through XII intact, motor grossly intact  EKG - sinus tachycardia with ventricular pacing  DEVICE  Normal device function.  See PaceArt for details.   Assess/Plan: 1. CHB - he has had return of his AV conduction. Today I have reprogrammed his PPM to allow for intrinsic conduction. 2. HTN - his blood pressure is up but he admits to not staying in his HD sessions as long as he is recommended to. 3. PPM - interogation of his medtronic DDD PM demonstrates normal function.   [name]

## 2017-08-10 NOTE — Patient Instructions (Addendum)
Medication Instructions:  Your physician recommends that you continue on your current medications as directed. Please refer to the Current Medication list given to you today.  Labwork: None ordered.  Testing/Procedures: None ordered.  Follow-Up: Your physician wants you to follow-up in: 9 months with Dr. Lovena Le.   You will receive a reminder letter in the mail two months in advance. If you don't receive a letter, please call our office to schedule the follow-up appointment.  Remote monitoring is used to monitor your Pacemaker from home. This monitoring reduces the number of office visits required to check your device to one time per year. It allows [place] to keep an eye on the functioning of your device to ensure it is working properly. You are scheduled for a device check from home on 11/09/2017. You may send your transmission at any time that day. If you have a wireless device, the transmission will be sent automatically. After your physician reviews your transmission, you will receive a postcard with your next transmission date.  Any Other Special Instructions Will Be Listed Below (If Applicable).  If you need a refill on your cardiac medications before your next appointment, please call your pharmacy.

## 2017-08-23 LAB — CUP PACEART INCLINIC DEVICE CHECK
Brady Statistic AS VP Percent: 94.81 %
Implantable Lead Implant Date: 20190219
Implantable Lead Implant Date: 20190219
Implantable Lead Location: 753860
Implantable Lead Model: 3830
Lead Channel Impedance Value: 323 Ohm
Lead Channel Impedance Value: 418 Ohm
Lead Channel Pacing Threshold Amplitude: 0.75 V
Lead Channel Pacing Threshold Pulse Width: 0.4 ms
Lead Channel Sensing Intrinsic Amplitude: 10.375 mV
Lead Channel Sensing Intrinsic Amplitude: 7 mV
Lead Channel Setting Pacing Pulse Width: 1 ms
Lead Channel Setting Sensing Sensitivity: 1.2 mV
MDC IDC LEAD LOCATION: 753859
MDC IDC MSMT BATTERY REMAINING LONGEVITY: 87 mo
MDC IDC MSMT BATTERY VOLTAGE: 2.98 V
MDC IDC MSMT LEADCHNL RA IMPEDANCE VALUE: 399 Ohm
MDC IDC MSMT LEADCHNL RA PACING THRESHOLD PULSEWIDTH: 0.4 ms
MDC IDC MSMT LEADCHNL RA SENSING INTR AMPL: 7.625 mV
MDC IDC MSMT LEADCHNL RV IMPEDANCE VALUE: 323 Ohm
MDC IDC MSMT LEADCHNL RV PACING THRESHOLD AMPLITUDE: 1.25 V
MDC IDC MSMT LEADCHNL RV SENSING INTR AMPL: 9.625 mV
MDC IDC PG IMPLANT DT: 20190219
MDC IDC SESS DTM: 20190611191428
MDC IDC SET LEADCHNL RA PACING AMPLITUDE: 3.25 V
MDC IDC SET LEADCHNL RV PACING AMPLITUDE: 2.5 V
MDC IDC STAT BRADY AP VP PERCENT: 0.03 %
MDC IDC STAT BRADY AP VS PERCENT: 0 %
MDC IDC STAT BRADY AS VS PERCENT: 5.16 %
MDC IDC STAT BRADY RA PERCENT PACED: 0.03 %
MDC IDC STAT BRADY RV PERCENT PACED: 94.84 %

## 2017-09-01 ENCOUNTER — Emergency Department (HOSPITAL_COMMUNITY)
Admission: EM | Admit: 2017-09-01 | Discharge: 2017-09-02 | Disposition: A | Discharge status: Home / Self Care | Payer: Medicare Other | Attending: Emergency Medicine | Admitting: Emergency Medicine

## 2017-09-01 ENCOUNTER — Encounter (HOSPITAL_COMMUNITY): Payer: Self-pay

## 2017-09-01 ENCOUNTER — Other Ambulatory Visit: Payer: Self-pay

## 2017-09-01 ENCOUNTER — Emergency Department (HOSPITAL_COMMUNITY): Payer: Medicare Other

## 2017-09-01 DIAGNOSIS — Z8673 Personal history of transient ischemic attack (TIA), and cerebral infarction without residual deficits: Secondary | ICD-10-CM | POA: Insufficient documentation

## 2017-09-01 DIAGNOSIS — N186 End stage renal disease: Secondary | ICD-10-CM | POA: Diagnosis not present

## 2017-09-01 DIAGNOSIS — I159 Secondary hypertension, unspecified: Secondary | ICD-10-CM | POA: Diagnosis not present

## 2017-09-01 DIAGNOSIS — Z8546 Personal history of malignant neoplasm of prostate: Secondary | ICD-10-CM | POA: Insufficient documentation

## 2017-09-01 DIAGNOSIS — R079 Chest pain, unspecified: Secondary | ICD-10-CM

## 2017-09-01 DIAGNOSIS — R1013 Epigastric pain: Secondary | ICD-10-CM | POA: Diagnosis not present

## 2017-09-01 DIAGNOSIS — E1122 Type 2 diabetes mellitus with diabetic chronic kidney disease: Secondary | ICD-10-CM | POA: Insufficient documentation

## 2017-09-01 DIAGNOSIS — Z79899 Other long term (current) drug therapy: Secondary | ICD-10-CM | POA: Insufficient documentation

## 2017-09-01 DIAGNOSIS — I120 Hypertensive chronic kidney disease with stage 5 chronic kidney disease or end stage renal disease: Secondary | ICD-10-CM | POA: Diagnosis not present

## 2017-09-01 DIAGNOSIS — I714 Abdominal aortic aneurysm, without rupture: Secondary | ICD-10-CM | POA: Insufficient documentation

## 2017-09-01 DIAGNOSIS — Z992 Dependence on renal dialysis: Secondary | ICD-10-CM | POA: Diagnosis not present

## 2017-09-01 LAB — COMPREHENSIVE METABOLIC PANEL
ALBUMIN: 3.6 g/dL (ref 3.5–5.0)
ALT: 16 U/L (ref 0–44)
ANION GAP: 12 (ref 5–15)
AST: 20 U/L (ref 15–41)
Alkaline Phosphatase: 61 U/L (ref 38–126)
BILIRUBIN TOTAL: 0.7 mg/dL (ref 0.3–1.2)
BUN: 9 mg/dL (ref 8–23)
CHLORIDE: 94 mmol/L — AB (ref 98–111)
CO2: 33 mmol/L — ABNORMAL HIGH (ref 22–32)
Calcium: 9 mg/dL (ref 8.9–10.3)
Creatinine, Ser: 4.93 mg/dL — ABNORMAL HIGH (ref 0.61–1.24)
GFR calc Af Amer: 11 mL/min — ABNORMAL LOW (ref >60)
GFR calc non Af Amer: 10 mL/min — ABNORMAL LOW (ref >60)
GLUCOSE: 127 mg/dL — AB (ref 70–99)
POTASSIUM: 3.7 mmol/L (ref 3.5–5.1)
SODIUM: 139 mmol/L (ref 135–145)
Total Protein: 6.8 g/dL (ref 6.5–8.1)

## 2017-09-01 LAB — CBC WITH DIFFERENTIAL/PLATELET
Abs Immature Granulocytes: 0 10*3/uL (ref 0.0–0.1)
BASOS ABS: 0.1 10*3/uL (ref 0.0–0.1)
BASOS PCT: 1 %
EOS PCT: 2 %
Eosinophils Absolute: 0.2 10*3/uL (ref 0.0–0.7)
HCT: 33.7 % — ABNORMAL LOW (ref 39.0–52.0)
Hemoglobin: 10.5 g/dL — ABNORMAL LOW (ref 13.0–17.0)
IMMATURE GRANULOCYTES: 1 %
LYMPHS ABS: 1.7 10*3/uL (ref 0.7–4.0)
Lymphocytes Relative: 23 %
MCH: 29.2 pg (ref 26.0–34.0)
MCHC: 31.2 g/dL (ref 30.0–36.0)
MCV: 93.9 fL (ref 78.0–100.0)
Monocytes Absolute: 0.7 10*3/uL (ref 0.1–1.0)
Monocytes Relative: 10 %
NEUTROS PCT: 63 %
Neutro Abs: 4.6 10*3/uL (ref 1.7–7.7)
PLATELETS: 298 10*3/uL (ref 150–400)
RBC: 3.59 MIL/uL — AB (ref 4.22–5.81)
RDW: 15.4 % (ref 11.5–15.5)
WBC: 7.2 10*3/uL (ref 4.0–10.5)

## 2017-09-01 LAB — I-STAT TROPONIN, ED: Troponin i, poc: 0.04 ng/mL (ref 0.00–0.08)

## 2017-09-01 LAB — BRAIN NATRIURETIC PEPTIDE: B NATRIURETIC PEPTIDE 5: 104.8 pg/mL — AB (ref 0.0–100.0)

## 2017-09-01 LAB — LIPASE, BLOOD: Lipase: 95 U/L — ABNORMAL HIGH (ref 11–51)

## 2017-09-01 MED ORDER — GI COCKTAIL ~~LOC~~
30.0000 mL | Freq: Once | ORAL | Status: AC
Start: 2017-09-01 — End: 2017-09-01
  Administered 2017-09-01: 30 mL via ORAL
  Filled 2017-09-01: qty 30

## 2017-09-01 MED ORDER — NITROGLYCERIN 0.4 MG SL SUBL
0.4000 mg | SUBLINGUAL_TABLET | SUBLINGUAL | Status: DC | PRN
  Administered 2017-09-01 (×2): 0.4 mg via SUBLINGUAL
  Filled 2017-09-01: qty 1

## 2017-09-01 MED ORDER — ASPIRIN 81 MG PO CHEW
324.0000 mg | CHEWABLE_TABLET | Freq: Once | ORAL | Status: AC
  Administered 2017-09-01: 324 mg via ORAL
  Filled 2017-09-01: qty 4

## 2017-09-01 MED ORDER — ONDANSETRON HCL 4 MG/2ML IJ SOLN
4.0000 mg | Freq: Once | INTRAMUSCULAR | Status: AC
  Administered 2017-09-01: 4 mg via INTRAVENOUS
  Filled 2017-09-01: qty 2

## 2017-09-01 NOTE — ED Triage Notes (Signed)
Pt brought in by [facility] from home for sudden onset abdominal pain x1 hour ago. Pt on dialysis, received last tx today. Pt A+Ox4, in NAD on arrival.

## 2017-09-01 NOTE — ED Provider Notes (Signed)
[facility] EMERGENCY DEPARTMENT Provider Note   CSN: [id number] Arrival date & time: 09/01/17  2026     History   Chief Complaint Chief Complaint  Patient presents with  . Abdominal Pain    HPI Sean Best is a 84 y.o. male.  HPI Sean Best is a 84 y.o. male with history of anemia, peripheral  disease vascular, and stage renal disease on dialysis, hypertension, diabetes, CVA, presents to emergency department with complaint of chest pain.  Patient states he finished his full dialysis today and just got home when he suddenly started having pain in the center of his chest radiating into the epigastric area.  He reports associated nausea and diaphoresis.  Denies any shortness of breath.  Did not take any medications prior to coming in.  Denies any vomiting.  No diarrhea.  States finished dialysis well with no problems.  No other complaints.  Past Medical History:  Diagnosis Date  . Anemia   . Arthritis    "knees" (04/11/2016)  . Blind left eye    S/P trauma  . Chronic total occlusion of artery of the extremities (HCC)    pt not aware of this  . Claudication ([place])   . ESRD on dialysis [facility])    "MWF; [name]" ((04/10/2016)  . ESRD on peritoneal dialysis [facility])    Started dialysis around April 2015 per son.  Has been doing peritoneal dialysis at home.     . Gout   . Hyperlipidemia   . Hypertension   . Overweight(278.02)   . Prostate cancer ([place]) 1990s  . Shingles   . Stroke [facility]) ~1998   denies residual on 04/11/2016  . Type II diabetes mellitus [facility])     Patient Active Problem List   Diagnosis Date Noted  . Acute hypoxemic respiratory failure ([place]) 04/18/2017  . Heart block 04/17/2017  . Fluid overload 04/17/2017  . Elevated troponin 04/17/2017  . ESRD on hemodialysis ([place]) 04/12/2016  . H/O: stroke 04/11/2016  . Saccular aneurysm of infrarenal aorta with thrombosed dissection of abdominal aorta 04/11/2016  . Obesity (BMI 30-39.9) 04/11/2016    . Syncope 04/11/2016  . Prostate CA ([place]) 12/28/2014  . Chest pain 12/28/2014  . Anemia of chronic disease 03/11/2013  . DM (diabetes mellitus), type 2 with renal complications, diet controlled 03/11/2013  . Benign hypertension 03/11/2013  . Hyperlipidemia 03/11/2013  . Umbilical hernia 12/27/2534    Past Surgical History:  Procedure Laterality Date  . AV FISTULA PLACEMENT, RADIOCEPHALIC  64/40/3474   Left arm  . CAPD INSERTION N/A 03/06/2013   Procedure: LAPAROSCOPIC INSERTION CONTINUOUS AMBULATORY PERITONEAL DIALYSIS  (CAPD) CATHETER;  Surgeon: Edward Jolly, MD;  Location: [place];  Service: General;  Laterality: N/A;  . CATARACT EXTRACTION Right   . EYE SURGERY Left 1970s   for eye injury  . HERNIA REPAIR    . INSERTION PROSTATE RADIATION SEED    . PACEMAKER IMPLANT N/A 04/20/2017   Procedure: PACEMAKER IMPLANT;  Surgeon: Evans Lance, MD;  Location: [place] CV LAB;  Service: Cardiovascular;  Laterality: N/A;  . UMBILICAL HERNIA REPAIR N/A 03/06/2013   Procedure: HERNIA REPAIR UMBILICAL WITH MESH;  Surgeon: Edward Jolly, MD;  Location[facility] OR;  Service: General;  Laterality: N/A;        Home Medications    Prior to Admission medications   Medication Sig Start Date End Date Taking? Authorizing Provider  allopurinol (ZYLOPRIM) 100 MG tablet Take 100 mg by mouth daily.  [provider]  amLODipine (NORVASC) 5 MG tablet Take 5 mg by mouth at bedtime. 01/04/17   [provider]  atorvastatin (LIPITOR) 40 MG tablet Take 40 mg by mouth daily. 07/07/17   [provider]  Cholecalciferol (VITAMIN D-3) 5000 units TABS Take 1,000 Units by mouth daily. 04/23/17   Mariel Aloe, MD  gabapentin (NEURONTIN) 100 MG capsule Take 100 mg by mouth at bedtime. 12/27/16   [provider]  lisinopril (PRINIVIL,ZESTRIL) 10 MG tablet Take 10 mg by mouth at bedtime. 03/25/17   [provider]  loperamide (IMODIUM A-D) 2 MG tablet Take 4 mg  by mouth 4 (four) times daily as needed for diarrhea or loose stools.    [provider]  metoprolol succinate (TOPROL-XL) 100 MG 24 hr tablet Take 100 mg by mouth at bedtime. 02/24/17   [provider]  multivitamin (RENA-VIT) TABS tablet Take 1 tablet by mouth daily.    [provider]  SENSIPAR 30 MG tablet Take 30 mg by mouth every Monday, Wednesday, and Friday with hemodialysis.  12/12/14   [provider]  sevelamer carbonate (RENVELA) 800 MG tablet Take 1,600-2,400 mg by mouth See admin instructions. Take 3 tablets (2400 mg) by mouth 3 times daily with meals and 2 tablets (1600 mg)  with snacks    [provider]    Family History Family History  Problem Relation Age of Onset  . Cancer Mother   . Heart disease Mother   . Cancer Father     Social History Social History   Tobacco Use  . Smoking status: Never Smoker  . Smokeless tobacco: Never Used  Substance Use Topics  . Alcohol use: No  . Drug use: No     Allergies   Cardura [doxazosin mesylate]   Review of Systems Review of Systems  Constitutional: Positive for diaphoresis. Negative for chills and fever.  Respiratory: Positive for chest tightness. Negative for cough and shortness of breath.   Cardiovascular: Positive for chest pain. Negative for palpitations and leg swelling.  Gastrointestinal: Positive for abdominal pain. Negative for abdominal distention, diarrhea, nausea and vomiting.  Genitourinary: Negative for dysuria, frequency, hematuria and urgency.  Musculoskeletal: Negative for arthralgias, myalgias, neck pain and neck stiffness.  Skin: Negative for rash.  Allergic/Immunologic: Negative for immunocompromised state.  Neurological: Positive for dizziness and light-headedness. Negative for weakness, numbness and headaches.  All other systems reviewed and are negative.    Physical Exam Updated Vital Signs BP (!) 167/100   Pulse 91   Temp (!) 97.4 F (36.3  C) (Oral)   Resp 17   Ht 5\' 5"  (1.651 m)   Wt 89.4 kg (197 lb)   SpO2 100%   BMI 32.78 kg/m   Physical Exam  Constitutional: He appears well-developed and well-nourished. No distress.  HENT:  Head: Normocephalic and atraumatic.  Eyes: Conjunctivae are normal.  Neck: Neck supple.  Cardiovascular: Normal rate, regular rhythm and normal heart sounds.  Pulmonary/Chest: Effort normal. No respiratory distress. He has no wheezes. He has no rales. He exhibits tenderness.  Chest wall tenderness  Abdominal: Soft. Bowel sounds are normal. He exhibits no distension. There is tenderness. There is no rebound.  Epigastric tenderness  Musculoskeletal: He exhibits no edema.  Neurological: He is alert.  Skin: Skin is warm and dry.  Nursing note and vitals reviewed.    ED Treatments / Results  Labs (all labs ordered are listed, but only abnormal results are displayed) Labs Reviewed  CBC  WITH DIFFERENTIAL/PLATELET - Abnormal; Notable for the following components:      Result Value   RBC 3.59 (*)    Hemoglobin 10.5 (*)    HCT 33.7 (*)    All other components within normal limits  COMPREHENSIVE METABOLIC PANEL - Abnormal; Notable for the following components:   Chloride 94 (*)    CO2 33 (*)    Glucose, Bld 127 (*)    Creatinine, Ser 4.93 (*)    GFR calc non Af Amer 10 (*)    GFR calc Af Amer 11 (*)    All other components within normal limits  LIPASE, BLOOD - Abnormal; Notable for the following components:   Lipase 95 (*)    All other components within normal limits  BRAIN NATRIURETIC PEPTIDE - Abnormal; Notable for the following components:   B Natriuretic Peptide 104.8 (*)    All other components within normal limits  I-STAT TROPONIN, ED    EKG EKG Interpretation  Date/Time:  Wednesday September 01 2017 20:40:15 EDT Ventricular Rate:  88 PR Interval:    QRS Duration: 102 QT Interval:  413 QTC Calculation: 500 R Axis:   -58 Text Interpretation:  Sinus rhythm Probable left atrial  enlargement Left anterior fascicular block Abnormal R-wave progression, late transition Nonspecific T abnormalities, lateral leads Borderline prolonged QT interval Baseline wander in lead(s) II aVR aVF Abnormal ekg Confirmed by Carmin Muskrat [id number]) on 09/01/2017 8:59:44 PM   Radiology Dg Chest 2 View  Result Date: 09/01/2017 CLINICAL DATA:  Abdomen pain EXAM: CHEST - 2 VIEW COMPARISON:  04/21/2017 FINDINGS: Right-sided pacing device as before. No acute consolidation or effusion. Mild cardiomegaly. No pneumothorax. IMPRESSION: No active cardiopulmonary disease.  Mild cardiomegaly Electronically Signed   By: Donavan Foil M.D.   On: 09/01/2017 22:02    Procedures Procedures (including critical care time)  Medications Ordered in ED Medications  nitroGLYCERIN (NITROSTAT) SL tablet 0.4 mg (0.4 mg Sublingual Given 09/01/17 2120)  aspirin chewable tablet 324 mg (324 mg Oral Given 09/01/17 2107)  ondansetron (ZOFRAN) injection 4 mg (4 mg Intravenous Given 09/01/17 2106)  gi cocktail (Maalox,Lidocaine,Donnatal) (30 mLs Oral Given 09/01/17 2231)     Initial Impression / Assessment and Plan / ED Course  I have reviewed the triage vital signs and the nursing notes.  Pertinent labs & imaging results that were available during my care of the patient were reviewed by me and considered in my medical decision making (see chart for details).     She did emergency department with epigastric/abdominal/chest pain.  He is tender in epigastric area as well as chest wall.  He states he is very nauseated.  No vomiting however.  He appears to be in sinus rhythm at this time, defibrillator present.  Will check labs, including abdominal labs and troponin.  EKG with no acute changes.  I will try aspirin nitroglycerin to see if it helps his pain.   No improvement after nitroglycerin.  Pain appears to be more epigastric now.  I will try GI cocktail.  Troponin is negative.  Lipase mildly elevated.  Question gastritis versus  pancreatitis versus ACS. Will get 2nnd trop.    1:03 AM Interrogation completed.  Patient's pacemaker showed 2 episodes of elevated heart rate, which possibly was V. tach, however the tech believes a might of been sinus, only lasting 2 to 4 seconds.  Patient has been in normal sinus rhythm since being in department for the last 3-1/2 hours, with no arrhythmias.  His second troponin  is 0.04, same as the first 1.  He states he feels much better and has had no pain since being here.  He believes that his symptoms were caused by "something that they put in my IV right before I finished my dialysis."  At this time he feels well and comfortable going home.  Will discharge home with close outpatient follow-up.  Return precautions discussed.  He is hypertensive, but did not take his medications this evening.  Vitals:   09/01/17 2315 09/01/17 2330 09/02/17 0015 09/02/17 0030  BP: (!) 176/112 (!) 177/103 (!) 169/119 (!) 191/120  Pulse:  97 98 (!) 101  Resp: 18 18 (!) 21 (!) 26  Temp:      TempSrc:      SpO2: 95% 100% 100% 100%  Weight:      Height:        Final Clinical Impressions(s) / ED Diagnoses   Final diagnoses:  Epigastric pain  Chest pain, unspecified type  Secondary hypertension    ED Discharge Orders    None       Jeannett Senior, PA-C 09/02/17 0108    Carmin Muskrat, MD 09/06/17 2129

## 2017-09-02 LAB — I-STAT TROPONIN, ED: Troponin i, poc: 0.04 ng/mL (ref 0.00–0.08)

## 2017-09-02 NOTE — Discharge Instructions (Signed)
[place] diet tomorrow, avoid fatty or spicy foods.  Follow-up with family doctor.  Return if worsening symptoms.

## 2017-09-06 DIAGNOSIS — R6883 Chills (without fever): Secondary | ICD-10-CM | POA: Insufficient documentation

## 2017-09-14 ENCOUNTER — Other Ambulatory Visit: Payer: Self-pay | Admitting: Internal Medicine

## 2017-09-14 DIAGNOSIS — R112 Nausea with vomiting, unspecified: Secondary | ICD-10-CM

## 2017-09-15 DIAGNOSIS — B9620 Unspecified Escherichia coli [E. coli] as the cause of diseases classified elsewhere: Secondary | ICD-10-CM | POA: Insufficient documentation

## 2017-09-20 DIAGNOSIS — N2581 Secondary hyperparathyroidism of renal origin: Secondary | ICD-10-CM | POA: Insufficient documentation

## 2017-09-21 ENCOUNTER — Ambulatory Visit
Admission: RE | Admit: 2017-09-21 | Discharge: 2017-09-21 | Disposition: A | Discharge status: Home / Self Care | Payer: Medicare Other | Source: Ambulatory Visit | Attending: Internal Medicine | Admitting: Internal Medicine

## 2017-09-21 DIAGNOSIS — R112 Nausea with vomiting, unspecified: Secondary | ICD-10-CM

## 2017-09-22 DIAGNOSIS — R770 Abnormality of albumin: Secondary | ICD-10-CM | POA: Insufficient documentation

## 2017-10-12 ENCOUNTER — Ambulatory Visit: Payer: Self-pay | Admitting: Surgery

## 2017-10-15 ENCOUNTER — Telehealth: Payer: Self-pay | Admitting: Internal Medicine

## 2017-10-15 ENCOUNTER — Telehealth: Payer: Self-pay

## 2017-10-15 NOTE — Telephone Encounter (Signed)
NOTES ON FILE FOR SURGICAL CLEARENCE

## 2017-10-15 NOTE — Telephone Encounter (Signed)
° °  [facility] Pre-operative Risk Assessment    Request for surgical clearance:  1. What type of surgery is being performed? Lap Chole  2. When is this surgery scheduled? TBD  3. What type of clearance is required (medical clearance vs. Pharmacy clearance to hold med vs. Both)? Medical   4. Are there any medications that need to be held prior to surgery and how long? Please advise any medications that need to be held and how long.   5. Practice name and name of physician performing surgery?  [facility], [place],  Dr. Michael Boston.  6. What is your office phone number [phone]   ATTN: Illene Regulus. CMA   7.   What is your office fax number [phone]  8.   Anesthesia type (None, local, MAC, general) ?  General    Derl Barrow 10/15/2017, 9:21 AM  _________________________________________________________________   (provider comments below)

## 2017-10-25 NOTE — Telephone Encounter (Signed)
He recently saw Dr. Lovena Le in clinic 07/2017 and was doing well. Chart review does not reveal medications that need to be held.   I attempted to contact the patient to clear over the phone. No answer at his number. Left voicemail at daughter's number.

## 2017-10-26 ENCOUNTER — Telehealth: Payer: Self-pay | Admitting: Physician Assistant

## 2017-10-26 NOTE — Telephone Encounter (Signed)
New Message:    Pt daughter is return a call  She will be in class from 10am -5pm

## 2017-10-27 NOTE — Telephone Encounter (Addendum)
   Primary Cardiologist: Cristopher Peru, MD  Chart reviewed as part of pre-operative protocol coverage. Patient was contacted 10/28/2017 in reference to pre-operative risk assessment for pending surgery as outlined below.  Sean Best was last seen on 08/10/2017 by Dr. Lovena Le.  Since that day, Montford Barg has done well without any chest pain or shortness of breath. He was mowing grass previously however has not done that in the recent week due to the heat.   Therefore, based on ACC/AHA guidelines, the patient would be at acceptable risk for the planned procedure without further cardiovascular testing.   I will route this recommendation to the requesting party via Epic fax function and remove from pre-op pool.  Please call with questions.  [place], [place] 10/28/2017, 12:40 PM

## 2017-11-09 ENCOUNTER — Ambulatory Visit (INDEPENDENT_AMBULATORY_CARE_PROVIDER_SITE_OTHER): Payer: Medicare Other | Admitting: *Deleted

## 2017-11-09 DIAGNOSIS — I459 Conduction disorder, unspecified: Secondary | ICD-10-CM | POA: Diagnosis not present

## 2017-11-09 NOTE — Progress Notes (Signed)
Remote pacemaker transmission.   

## 2017-11-24 DIAGNOSIS — Z23 Encounter for immunization: Secondary | ICD-10-CM | POA: Insufficient documentation

## 2017-11-25 ENCOUNTER — Encounter (HOSPITAL_COMMUNITY): Payer: Self-pay

## 2017-11-25 ENCOUNTER — Encounter (HOSPITAL_COMMUNITY)
Admission: RE | Admit: 2017-11-25 | Discharge: 2017-11-25 | Disposition: A | Discharge status: Home / Self Care | Payer: Medicare Other | Source: Ambulatory Visit | Attending: Surgery | Admitting: Surgery

## 2017-11-25 ENCOUNTER — Other Ambulatory Visit: Payer: Self-pay

## 2017-11-25 DIAGNOSIS — N186 End stage renal disease: Secondary | ICD-10-CM | POA: Diagnosis not present

## 2017-11-25 DIAGNOSIS — D649 Anemia, unspecified: Secondary | ICD-10-CM | POA: Diagnosis not present

## 2017-11-25 DIAGNOSIS — I442 Atrioventricular block, complete: Secondary | ICD-10-CM | POA: Insufficient documentation

## 2017-11-25 DIAGNOSIS — Z79899 Other long term (current) drug therapy: Secondary | ICD-10-CM | POA: Diagnosis not present

## 2017-11-25 DIAGNOSIS — Z950 Presence of cardiac pacemaker: Secondary | ICD-10-CM | POA: Insufficient documentation

## 2017-11-25 DIAGNOSIS — Z8673 Personal history of transient ischemic attack (TIA), and cerebral infarction without residual deficits: Secondary | ICD-10-CM | POA: Diagnosis not present

## 2017-11-25 DIAGNOSIS — Z992 Dependence on renal dialysis: Secondary | ICD-10-CM | POA: Insufficient documentation

## 2017-11-25 DIAGNOSIS — E1122 Type 2 diabetes mellitus with diabetic chronic kidney disease: Secondary | ICD-10-CM | POA: Diagnosis not present

## 2017-11-25 DIAGNOSIS — M109 Gout, unspecified: Secondary | ICD-10-CM | POA: Diagnosis not present

## 2017-11-25 DIAGNOSIS — Z01812 Encounter for preprocedural laboratory examination: Secondary | ICD-10-CM | POA: Diagnosis present

## 2017-11-25 DIAGNOSIS — I120 Hypertensive chronic kidney disease with stage 5 chronic kidney disease or end stage renal disease: Secondary | ICD-10-CM | POA: Insufficient documentation

## 2017-11-25 DIAGNOSIS — H5462 Unqualified visual loss, left eye, normal vision right eye: Secondary | ICD-10-CM | POA: Diagnosis not present

## 2017-11-25 DIAGNOSIS — E785 Hyperlipidemia, unspecified: Secondary | ICD-10-CM | POA: Insufficient documentation

## 2017-11-25 DIAGNOSIS — K811 Chronic cholecystitis: Secondary | ICD-10-CM | POA: Diagnosis not present

## 2017-11-25 HISTORY — DX: Presence of cardiac pacemaker: Z95.0

## 2017-11-25 LAB — CBC
HCT: 35 % — ABNORMAL LOW (ref 39.0–52.0)
HEMOGLOBIN: 10.5 g/dL — AB (ref 13.0–17.0)
MCH: 28.4 pg (ref 26.0–34.0)
MCHC: 30 g/dL (ref 30.0–36.0)
MCV: 94.6 fL (ref 78.0–100.0)
PLATELETS: 366 10*3/uL (ref 150–400)
RBC: 3.7 MIL/uL — AB (ref 4.22–5.81)
RDW: 16.3 % — ABNORMAL HIGH (ref 11.5–15.5)
WBC: 7.6 10*3/uL (ref 4.0–10.5)

## 2017-11-25 LAB — BASIC METABOLIC PANEL
ANION GAP: 17 — AB (ref 5–15)
BUN: 24 mg/dL — ABNORMAL HIGH (ref 8–23)
CO2: 27 mmol/L (ref 22–32)
CREATININE: 6.8 mg/dL — AB (ref 0.61–1.24)
Calcium: 9.3 mg/dL (ref 8.9–10.3)
Chloride: 96 mmol/L — ABNORMAL LOW (ref 98–111)
GFR, EST AFRICAN AMERICAN: 8 mL/min — AB (ref >60)
GFR, EST NON AFRICAN AMERICAN: 7 mL/min — AB (ref >60)
Glucose, Bld: 112 mg/dL — ABNORMAL HIGH (ref 70–99)
Potassium: 4.3 mmol/L (ref 3.5–5.1)
SODIUM: 140 mmol/L (ref 135–145)

## 2017-11-25 LAB — GLUCOSE, CAPILLARY: Glucose-Capillary: 118 mg/dL — ABNORMAL HIGH (ref 70–99)

## 2017-11-25 LAB — HEMOGLOBIN A1C
HEMOGLOBIN A1C: 5.6 % (ref 4.8–5.6)
MEAN PLASMA GLUCOSE: 114.02 mg/dL

## 2017-11-25 NOTE — Pre-Procedure Instructions (Signed)
Sean Best  11/25/2017      [id number] - [place], [place] - [place] [address] [place] [address] Phone: [phone] Fax: [phone]  [place], [place] - [address] [place] [address] Phone: [phone] Fax: [phone]    Your procedure is scheduled on 12-02-2017 Thursday .  Report to [facility] Admitting at 5:30 A.M .  Call this number if you have problems the morning of surgery:  [phone]   Remember:  Do not eat or drink after midnight.  .                        Take these medicines the morning of surgery with A SIP OF WATER  Allopurinol(Zyloprim) Atorvastatin(Lipitor) STOP TAKING ANY ASPIRIN(UNLESS OTHERWISE INSTRUCTED BY YOUR SURGEON),ANTIINFLAMATORIES (IBUPROFEN,ALEVE,MOTRIN,ADVIL,GOODY'S POWDERS),HERBAL SUPPLEMENTS,FISH OIL,AND VITAMINS 5-7 DAYS PRIOR TO SURGERY     Do not wear jewelry   Do not wear lotions, powders, or perfumes, or deodorant.  Do not shave 48 hours prior to surgery.  Men may shave face and neck.   Do not bring valuables to the hospital.  [facility] is not responsible for any belongings or valuables.  Contacts, dentures or bridgework may not be worn into surgery.  Leave your suitcase in the car.  After surgery it may be brought to your room.  For patients admitted to the hospital, discharge time will be determined by your treatment team.  Patients discharged the day of surgery will not be allowed to drive home.    [place] - Preparing for Surgery  Before surgery, you can play an important role.  Because skin is not sterile, your skin needs to be as free of germs as possible.  You can reduce the number of germs on you skin by washing with CHG (chlorahexidine gluconate) soap before surgery.  CHG is an antiseptic cleaner which kills germs and bonds with the skin to continue  killing germs even after washing.  Oral Hygiene is also important in reducing the risk of infection.  Remember to brush your teeth with your regular toothpaste the morning of surgery.  Please DO NOT use if you have an allergy to CHG or antibacterial soaps.  If your skin becomes reddened/irritated stop using the CHG and inform your nurse when you arrive at Short Stay.  Do not shave (including legs and underarms) for at least 48 hours prior to the first CHG shower.  You may shave your face.  Please follow these instructions carefully:   1.  Shower with CHG Soap the night before surgery and the morning of Surgery.  2.  If you choose to wash your hair, wash your hair first as usual with your normal shampoo.  3.  After you shampoo, rinse your hair and body thoroughly to remove the shampoo. 4.  Use CHG as you would any other liquid soap.  You can apply chg directly to the skin and wash gently with a      scrungie or washcloth.           5.  Apply the CHG Soap to your body ONLY FROM THE NECK DOWN.   Do not use on open wounds or open sores. Avoid contact with your eyes, ears, mouth and genitals (private parts).  Wash genitals (private parts) with your normal soap.  6.  Wash thoroughly, paying special attention  to the area where your surgery will be performed.  7.  Thoroughly rinse your body with warm water from the neck down.  8.  DO NOT shower/wash with your normal soap after using and rinsing off the CHG Soap.  9.  Pat yourself dry with a clean towel.            10.  Wear clean pajamas.            11.  Place clean sheets on your bed the night of your first shower and do not sleep with pets.  Day of Surgery  Do not apply any lotions/deoderants the morning of surgery.   Please wear clean clothes to the hospital/surgery center. Remember to brush your teeth with toothpaste.    . .       .  Please read over the following fact sheets that you were given. Pain Booklet, Coughing and Deep Breathing  and Surgical Site Infection Prevention

## 2017-11-25 NOTE — Progress Notes (Addendum)
  Pt. Has [employer] pacemaker,email sent to Norva Riffle and Francene Finders  To inform of date and time of pt. Surgery.  Peri-operative Device orders faxed to [facility].  PCP  Seward Carol MD   Cardiologist Dr. Cristopher Peru

## 2017-11-26 NOTE — Progress Notes (Signed)
Anesthesia Chart Review:  Case:  [id number] Date/Time:  12/02/17 0715   Procedure:  LAPAROSCOPIC CHOLECYSTECTOMY SINGLE SITE WITH INTRAOPERATIVE CHOLANGIOGRAM ERAS PATHWAY (N/A )   Anesthesia type:  General   Pre-op diagnosis:  Chronic cholecystitis   Location:  [facility] OR ROOM 01 / [place] [facility]   Surgeon:  Michael Boston, MD      DISCUSSION: 84 yo male never smoker. Pertinent hx includes Gout, ESRD on HD, Stroke (denies residual), Intermittent CHB s/p Medtronic pacemaker, DMII, Blindness in left eye, HTN, Anemia.  Pt hospitalized 2/16-2/22/2019 for progressive SOB, LE edema, orthopnea, paroxysmal nocturnal dyspnea.  Respiratory failure improved with HD. He was found the have third degree heart block and PPM placed 04/20/17. He also had elevated troponin thought to be demand ischemia. Cardiology consulted. Transthoracic Echocardiogram significant for normal EF with LVH, biatrial enlargement, MR/TR and significant PAH. Plan for outpatient Myoview per cardiology.  Per Dr. Kyla Balzarine note 04/19/2017 1. CHB:  Persistent despite normalization of K and d/c Toprol Have asked Dr Caryl Comes and EP to see for PPM possibly in am 2. AS moderate by echo f/u TTE in 6 months  3. Troponin no chest pain no ischemic ECG changes normal EF by echo with no RWMA;s can risk stratify as outpatient with myovue  4. CRF: dialysis today per nephrology   Pt seen at [facility] ED 09/01/2017 for CP that developed after dialysis.Troponin neg x 2, lipase mildly elevated. No improvement after nitroglycerin. Pacemaker interrogation unrevealing. He maintained NSR while in ED. Cardiac workup negative, pt improved while in ED and was felt appropriate for discharge. He was significantly hypertensive but had not taken his BP meds.  Cardiac clearance by Rosaria Ferries, PA-C dated 10/27/2017 states "Patient was contacted 10/28/2017 in reference to pre-operative risk assessment for pending surgery as outlined below.  Sean Best was last seen on 08/10/2017 by Dr. Lovena Le.   Since that day, Sean Best has done well without any chest pain or shortness of breath. He was mowing grass previously however has not done that in the recent week due to the heat.   Therefore, based on ACC/AHA guidelines, the patient would be at acceptable risk for the planned procedure without further cardiovascular testing."  Case discussed with Dr. Gifford Shave. Pt has cardiac clearance but it does not appear he's had followup Myoview or TTE recommended during his hospitalization in February. Per Dr. Gifford Shave pt can likely proceed as long as he is asymptomatic. I spoke with the pt by phone 11/29/17. He reports generally feeling well. He denies any chest pain or SOB since discharge in February. 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 He will need DOS eval by anesthesia but as long as he remains asymptomatic I anticipate he can proceed as planned.   VS: BP 128/82   Pulse 100   Temp 37.1 C   Resp 20   Ht _0  (1.651 m)   Wt 92.9 kg   SpO2 99%   BMI 34.08 kg/m   PROVIDERS: Seward Carol, MD is PCP  Cristopher Peru, MD is Cardiologist   [facility] for nephrology care  LABS: Labs reviewed: Acceptable for surgery. (all labs ordered are listed, but only abnormal results are displayed)  Labs Reviewed  GLUCOSE, CAPILLARY - Abnormal; Notable for the following components:      Result Value   Glucose-Capillary 118 (*)    All other components within normal limits  BASIC METABOLIC  PANEL - Abnormal; Notable for the following components:   Chloride 96 (*)    Glucose, Bld 112 (*)    BUN 24 (*)    Creatinine, Ser 6.80 (*)    GFR calc non Af Amer 7 (*)    GFR calc Af Amer 8 (*)    Anion gap 17 (*)    All other components within normal limits  CBC - Abnormal; Notable for the following components:   RBC 3.70 (*)    Hemoglobin 10.5 (*)    HCT 35.0 (*)    RDW 16.3 (*)    All other components within normal limits   HEMOGLOBIN A1C     IMAGES: CHEST - 2 VIEW 09/01/2017  COMPARISON:  04/21/2017  FINDINGS: Right-sided pacing device as before. No acute consolidation or effusion. Mild cardiomegaly. No pneumothorax.  IMPRESSION: No active cardiopulmonary disease.  Mild cardiomegaly   EKG: 09/01/2017: Sinus rhythm. Abnormal R-wave progression, late transition. Inferior infarct, old  CV: TTE 04/18/2017: Study Conclusions  - Left ventricle: The cavity size was normal. There was severe   concentric hypertrophy. Systolic function was vigorous. The   estimated ejection fraction was in the range of 65% to 70%. Wall   motion was normal; there were no regional wall motion   abnormalities. The study is not technically sufficient to allow   evaluation of LV diastolic function. - Aortic valve: Calcified with moderate stenosis. There was trivial   regurgitation. Mean gradient (S): 19 mm Hg. Peak gradient (S): 43   mm Hg. Valve area (VTI): 1.25 cm^2. Valve area (Vmax): 1.1 cm^2.   Valve area (Vmean): 1.22 cm^2. - Mitral valve: Thickened, sclerotic leaflets. MAC. Mild to   moderate regurgitation. - Left atrium: Severely dilated. - Right ventricle: The cavity size was mildly dilated. Systolic   function was normal. - Right atrium: Severely dilated. - Tricuspid valve: There was moderate regurgitation. - Pulmonary arteries: PA peak pressure: 72 mm Hg (S). - Inferior vena cava: The vessel was normal in size. The   respirophasic diameter changes were in the normal range (>= 50%),   consistent with normal central venous pressure.  Impressions:  - Compared to a prior study in 04/2016, the LVEF is higher at   65-70%. There is severe LVH and now moderate aortic stenosis with   a mean gradient of 19 mmHg (up from 10 mmHg). Severe biatrial   enlargement is noted with mild to moderate MR, moderate TR and   moderate to severe pulmonary hypertension and RVSP of 72 mmHg   with a normal sized IVC.  Past  Medical History:  Diagnosis Date  . Anemia   . Arthritis    "knees" (04/11/2016)  . Blind left eye    S/P trauma  . Chronic total occlusion of artery of the extremities (HCC)    pt not aware of this  . Claudication ([place])   . ESRD on dialysis [facility])    "MWF; [name]" ((04/10/2016)  . ESRD on peritoneal dialysis [facility])    Started dialysis around April 2015 per son.  Has been doing peritoneal dialysis at home.     . Gout   . Hyperlipidemia   . Hypertension   . Overweight(278.02)   . Presence of permanent cardiac pacemaker    medtronic  . Prostate cancer ([place]) 1990s  . Shingles   . Stroke [facility]) ~1998   denies residual on 04/11/2016  . Type II diabetes mellitus (HCC)    diet controlled    Past  Surgical History:  Procedure Laterality Date  . AV FISTULA PLACEMENT, RADIOCEPHALIC  88/87/5797   Left arm  . CAPD INSERTION N/A 03/06/2013   Procedure: LAPAROSCOPIC INSERTION CONTINUOUS AMBULATORY PERITONEAL DIALYSIS  (CAPD) CATHETER;  Surgeon: Edward Jolly, MD;  Location: [place];  Service: General;  Laterality: N/A;  . CATARACT EXTRACTION Right   . EYE SURGERY Left 1970s   for eye injury  . HERNIA REPAIR    . INSERTION PROSTATE RADIATION SEED    . PACEMAKER IMPLANT N/A 04/20/2017   Procedure: PACEMAKER IMPLANT;  Surgeon: Evans Lance, MD;  Location: [place] CV LAB;  Service: Cardiovascular;  Laterality: N/A;  . UMBILICAL HERNIA REPAIR N/A 03/06/2013   Procedure: HERNIA REPAIR UMBILICAL WITH MESH;  Surgeon: Edward Jolly, MD;  Location: [facility] OR;  Service: General;  Laterality: N/A;    MEDICATIONS: . allopurinol (ZYLOPRIM) 100 MG tablet  . amLODipine (NORVASC) 10 MG tablet  . atorvastatin (LIPITOR) 40 MG tablet  . calcium acetate (PHOSLO) 667 MG capsule  . calcium carbonate (TUMS EX) 750 MG chewable tablet  . Cholecalciferol (VITAMIN D-3) 1000 units CAPS  . gabapentin (NEURONTIN) 100 MG capsule  . ibuprofen (ADVIL,MOTRIN) 200 MG tablet  . lidocaine-prilocaine (EMLA)  cream  . lisinopril (PRINIVIL,ZESTRIL) 10 MG tablet  . loperamide (IMODIUM A-D) 2 MG tablet  . multivitamin (RENA-VIT) TABS tablet  . sevelamer carbonate (RENVELA) 2.4 g PACK   No current facility-administered medications for this encounter.     Wynonia Musty [facility] Short Stay [facility]/Anesthesiology Phone [phone] 11/29/2017 9:54 AM

## 2017-12-01 LAB — CUP PACEART REMOTE DEVICE CHECK
Battery Voltage: 3.03 V
Brady Statistic AP VP Percent: 0 %
Brady Statistic AP VS Percent: 0 %
Brady Statistic AS VP Percent: 0.03 %
Brady Statistic RA Percent Paced: 0.01 %
Date Time Interrogation Session: 20190910044700
Implantable Lead Location: 753859
Implantable Lead Location: 753860
Implantable Lead Model: 3830
Implantable Lead Model: 5076
Lead Channel Impedance Value: 285 Ohm
Lead Channel Impedance Value: 304 Ohm
Lead Channel Pacing Threshold Pulse Width: 0.4 ms
Lead Channel Sensing Intrinsic Amplitude: 4.5 mV
Lead Channel Sensing Intrinsic Amplitude: 8.875 mV
Lead Channel Sensing Intrinsic Amplitude: 8.875 mV
Lead Channel Setting Pacing Amplitude: 2 V
Lead Channel Setting Pacing Amplitude: 2.5 V
Lead Channel Setting Pacing Pulse Width: 1 ms
MDC IDC LEAD IMPLANT DT: 20190219
MDC IDC LEAD IMPLANT DT: 20190219
MDC IDC MSMT BATTERY REMAINING LONGEVITY: 108 mo
MDC IDC MSMT LEADCHNL RA IMPEDANCE VALUE: 380 Ohm
MDC IDC MSMT LEADCHNL RA PACING THRESHOLD AMPLITUDE: 0.75 V
MDC IDC MSMT LEADCHNL RA PACING THRESHOLD PULSEWIDTH: 0.4 ms
MDC IDC MSMT LEADCHNL RA SENSING INTR AMPL: 4.5 mV
MDC IDC MSMT LEADCHNL RV IMPEDANCE VALUE: 399 Ohm
MDC IDC MSMT LEADCHNL RV PACING THRESHOLD AMPLITUDE: 1.125 V
MDC IDC PG IMPLANT DT: 20190219
MDC IDC SET LEADCHNL RV SENSING SENSITIVITY: 1.2 mV
MDC IDC STAT BRADY AS VS PERCENT: 99.96 %
MDC IDC STAT BRADY RV PERCENT PACED: 0.04 %

## 2017-12-01 MED ORDER — BUPIVACAINE LIPOSOME 1.3 % IJ SUSP
20.0000 mL | Freq: Once | INTRAMUSCULAR | Status: DC
  Filled 2017-12-01 (×3): qty 20

## 2017-12-02 ENCOUNTER — Encounter (HOSPITAL_COMMUNITY): Payer: Self-pay | Admitting: Surgery

## 2017-12-02 ENCOUNTER — Ambulatory Visit (HOSPITAL_COMMUNITY): Payer: Medicare Other | Admitting: Physician Assistant

## 2017-12-02 ENCOUNTER — Observation Stay (HOSPITAL_COMMUNITY)
Admission: RE | Admit: 2017-12-02 | Discharge: 2017-12-03 | Disposition: A | Discharge status: Home / Self Care | Payer: Medicare Other | Source: Ambulatory Visit | Attending: Surgery | Admitting: Surgery

## 2017-12-02 ENCOUNTER — Ambulatory Visit (HOSPITAL_COMMUNITY): Payer: Medicare Other | Admitting: Anesthesiology

## 2017-12-02 ENCOUNTER — Other Ambulatory Visit: Payer: Self-pay

## 2017-12-02 ENCOUNTER — Encounter (HOSPITAL_COMMUNITY)
Admission: RE | Disposition: A | Discharge status: Home / Self Care | Payer: Self-pay | Source: Ambulatory Visit | Attending: Surgery

## 2017-12-02 DIAGNOSIS — K812 Acute cholecystitis with chronic cholecystitis: Secondary | ICD-10-CM | POA: Insufficient documentation

## 2017-12-02 DIAGNOSIS — K8010 Calculus of gallbladder with chronic cholecystitis without obstruction: Secondary | ICD-10-CM

## 2017-12-02 DIAGNOSIS — Z79899 Other long term (current) drug therapy: Secondary | ICD-10-CM | POA: Insufficient documentation

## 2017-12-02 DIAGNOSIS — E1129 Type 2 diabetes mellitus with other diabetic kidney complication: Secondary | ICD-10-CM | POA: Diagnosis present

## 2017-12-02 DIAGNOSIS — I10 Essential (primary) hypertension: Secondary | ICD-10-CM | POA: Diagnosis present

## 2017-12-02 DIAGNOSIS — Z8546 Personal history of malignant neoplasm of prostate: Secondary | ICD-10-CM | POA: Diagnosis not present

## 2017-12-02 DIAGNOSIS — N186 End stage renal disease: Secondary | ICD-10-CM | POA: Insufficient documentation

## 2017-12-02 DIAGNOSIS — F329 Major depressive disorder, single episode, unspecified: Secondary | ICD-10-CM | POA: Diagnosis not present

## 2017-12-02 DIAGNOSIS — Z992 Dependence on renal dialysis: Secondary | ICD-10-CM | POA: Insufficient documentation

## 2017-12-02 DIAGNOSIS — I120 Hypertensive chronic kidney disease with stage 5 chronic kidney disease or end stage renal disease: Secondary | ICD-10-CM | POA: Insufficient documentation

## 2017-12-02 DIAGNOSIS — Z8673 Personal history of transient ischemic attack (TIA), and cerebral infarction without residual deficits: Secondary | ICD-10-CM | POA: Diagnosis not present

## 2017-12-02 DIAGNOSIS — E669 Obesity, unspecified: Secondary | ICD-10-CM | POA: Diagnosis present

## 2017-12-02 DIAGNOSIS — K8012 Calculus of gallbladder with acute and chronic cholecystitis without obstruction: Principal | ICD-10-CM | POA: Insufficient documentation

## 2017-12-02 DIAGNOSIS — E7800 Pure hypercholesterolemia, unspecified: Secondary | ICD-10-CM | POA: Insufficient documentation

## 2017-12-02 DIAGNOSIS — G4730 Sleep apnea, unspecified: Secondary | ICD-10-CM | POA: Insufficient documentation

## 2017-12-02 DIAGNOSIS — D649 Anemia, unspecified: Secondary | ICD-10-CM | POA: Diagnosis present

## 2017-12-02 DIAGNOSIS — Z950 Presence of cardiac pacemaker: Secondary | ICD-10-CM

## 2017-12-02 DIAGNOSIS — K219 Gastro-esophageal reflux disease without esophagitis: Secondary | ICD-10-CM | POA: Insufficient documentation

## 2017-12-02 DIAGNOSIS — E1122 Type 2 diabetes mellitus with diabetic chronic kidney disease: Secondary | ICD-10-CM | POA: Diagnosis not present

## 2017-12-02 DIAGNOSIS — M109 Gout, unspecified: Secondary | ICD-10-CM | POA: Insufficient documentation

## 2017-12-02 DIAGNOSIS — E785 Hyperlipidemia, unspecified: Secondary | ICD-10-CM | POA: Diagnosis present

## 2017-12-02 HISTORY — PX: CHOLECYSTECTOMY: SHX55

## 2017-12-02 HISTORY — PX: LAPAROSCOPIC CHOLECYSTECTOMY SINGLE SITE WITH INTRAOPERATIVE CHOLANGIOGRAM: SHX6538

## 2017-12-02 LAB — POCT I-STAT 4, (NA,K, GLUC, HGB,HCT)
GLUCOSE: 94 mg/dL (ref 70–99)
HEMATOCRIT: 31 % — AB (ref 39.0–52.0)
Hemoglobin: 10.5 g/dL — ABNORMAL LOW (ref 13.0–17.0)
Potassium: 4.1 mmol/L (ref 3.5–5.1)
SODIUM: 140 mmol/L (ref 135–145)

## 2017-12-02 LAB — GLUCOSE, CAPILLARY
GLUCOSE-CAPILLARY: 93 mg/dL (ref 70–99)
GLUCOSE-CAPILLARY: 120 mg/dL — AB (ref 70–99)

## 2017-12-02 SURGERY — LAPAROSCOPIC CHOLECYSTECTOMY SINGLE SITE WITH INTRAOPERATIVE CHOLANGIOGRAM
Anesthesia: General | Site: Abdomen

## 2017-12-02 MED ORDER — HYDROMORPHONE HCL 1 MG/ML IJ SOLN: 0.5000 mg | INTRAMUSCULAR | Status: DC | PRN

## 2017-12-02 MED ORDER — CHLORHEXIDINE GLUCONATE CLOTH 2 % EX PADS: 6.0000 | MEDICATED_PAD | Freq: Once | CUTANEOUS | Status: DC

## 2017-12-02 MED ORDER — BISACODYL 10 MG RE SUPP: 10.0000 mg | Freq: Every day | RECTAL | Status: DC | PRN

## 2017-12-02 MED ORDER — NEOSTIGMINE METHYLSULFATE 10 MG/10ML IV SOLN
INTRAVENOUS | Status: DC | PRN
  Administered 2017-12-02: 5 mg via INTRAVENOUS

## 2017-12-02 MED ORDER — BUPIVACAINE-EPINEPHRINE 0.25% -1:200000 IJ SOLN
INTRAMUSCULAR | Status: DC | PRN
  Administered 2017-12-02: 60 mL

## 2017-12-02 MED ORDER — ONDANSETRON HCL 4 MG/2ML IJ SOLN
INTRAMUSCULAR | Status: AC
  Filled 2017-12-02: qty 2

## 2017-12-02 MED ORDER — ACETAMINOPHEN 500 MG PO TABS
1000.0000 mg | ORAL_TABLET | Freq: Three times a day (TID) | ORAL | Status: DC
  Administered 2017-12-02 – 2017-12-03 (×4): 1000 mg via ORAL
  Filled 2017-12-02 (×4): qty 2

## 2017-12-02 MED ORDER — SODIUM CHLORIDE 0.9 % IV SOLN
INTRAVENOUS | Status: DC | PRN
  Administered 2017-12-02: 25 ug/min via INTRAVENOUS

## 2017-12-02 MED ORDER — LIDOCAINE 2% (20 MG/ML) 5 ML SYRINGE
INTRAMUSCULAR | Status: DC | PRN
  Administered 2017-12-02: 60 mg via INTRAVENOUS

## 2017-12-02 MED ORDER — ALLOPURINOL 100 MG PO TABS
100.0000 mg | ORAL_TABLET | Freq: Every day | ORAL | Status: DC
Start: 2017-12-02 — End: 2017-12-03
  Administered 2017-12-02 – 2017-12-03 (×2): 100 mg via ORAL
  Filled 2017-12-02 (×2): qty 1

## 2017-12-02 MED ORDER — FENTANYL CITRATE (PF) 250 MCG/5ML IJ SOLN
INTRAMUSCULAR | Status: DC | PRN
  Administered 2017-12-02: 100 ug via INTRAVENOUS
  Administered 2017-12-02: 50 ug via INTRAVENOUS

## 2017-12-02 MED ORDER — TRAMADOL HCL 50 MG PO TABS: 50.0000 mg | ORAL_TABLET | Freq: Four times a day (QID) | ORAL | Status: DC | PRN

## 2017-12-02 MED ORDER — FENTANYL CITRATE (PF) 100 MCG/2ML IJ SOLN: 25.0000 ug | INTRAMUSCULAR | Status: DC | PRN

## 2017-12-02 MED ORDER — GABAPENTIN 300 MG PO CAPS
300.0000 mg | ORAL_CAPSULE | ORAL | Status: AC
  Administered 2017-12-02: 300 mg via ORAL
  Filled 2017-12-02: qty 1

## 2017-12-02 MED ORDER — PROCHLORPERAZINE MALEATE 10 MG PO TABS
10.0000 mg | ORAL_TABLET | Freq: Four times a day (QID) | ORAL | Status: DC | PRN
  Filled 2017-12-02: qty 1

## 2017-12-02 MED ORDER — SODIUM CHLORIDE 0.9 % IR SOLN
Status: DC | PRN
  Administered 2017-12-02: 1000 mL
  Administered 2017-12-02: 2000 mL

## 2017-12-02 MED ORDER — AMLODIPINE BESYLATE 10 MG PO TABS
10.0000 mg | ORAL_TABLET | Freq: Every day | ORAL | Status: DC
  Administered 2017-12-02: 10 mg via ORAL
  Filled 2017-12-02: qty 1

## 2017-12-02 MED ORDER — LACTATED RINGERS IV SOLN: 1000.0000 mL | Freq: Three times a day (TID) | INTRAVENOUS | Status: DC | PRN

## 2017-12-02 MED ORDER — OXYCODONE HCL 5 MG/5ML PO SOLN: 5.0000 mg | Freq: Once | ORAL | Status: DC | PRN

## 2017-12-02 MED ORDER — GABAPENTIN 100 MG PO CAPS: 100.0000 mg | ORAL_CAPSULE | Freq: Every day | ORAL | Status: DC

## 2017-12-02 MED ORDER — SODIUM CHLORIDE 0.9 % IV SOLN
INTRAVENOUS | Status: DC | PRN
  Administered 2017-12-02 (×2): via INTRAVENOUS

## 2017-12-02 MED ORDER — OXYCODONE HCL 5 MG PO TABS: 5.0000 mg | ORAL_TABLET | Freq: Once | ORAL | Status: DC | PRN

## 2017-12-02 MED ORDER — PROPOFOL 10 MG/ML IV BOLUS
INTRAVENOUS | Status: DC | PRN
  Administered 2017-12-02: 40 mg via INTRAVENOUS
  Administered 2017-12-02: 120 mg via INTRAVENOUS

## 2017-12-02 MED ORDER — ACETAMINOPHEN 500 MG PO TABS
1000.0000 mg | ORAL_TABLET | ORAL | Status: AC
  Administered 2017-12-02: 1000 mg via ORAL
  Filled 2017-12-02: qty 2

## 2017-12-02 MED ORDER — DEXAMETHASONE SODIUM PHOSPHATE 10 MG/ML IJ SOLN
INTRAMUSCULAR | Status: DC | PRN
  Administered 2017-12-02: 5 mg via INTRAVENOUS

## 2017-12-02 MED ORDER — ONDANSETRON 4 MG PO TBDP: 4.0000 mg | ORAL_TABLET | Freq: Four times a day (QID) | ORAL | Status: DC | PRN

## 2017-12-02 MED ORDER — ROCURONIUM BROMIDE 10 MG/ML (PF) SYRINGE
PREFILLED_SYRINGE | INTRAVENOUS | Status: DC | PRN
  Administered 2017-12-02: 10 mg via INTRAVENOUS
  Administered 2017-12-02: 40 mg via INTRAVENOUS

## 2017-12-02 MED ORDER — IODIXANOL 320 MG/ML IV SOLN
INTRAVENOUS | Status: DC | PRN
  Administered 2017-12-02: 50 mL

## 2017-12-02 MED ORDER — CHLORHEXIDINE GLUCONATE CLOTH 2 % EX PADS
6.0000 | MEDICATED_PAD | Freq: Every day | CUTANEOUS | Status: DC
  Administered 2017-12-03: 6 via TOPICAL

## 2017-12-02 MED ORDER — IBUPROFEN 400 MG PO TABS: 400.0000 mg | ORAL_TABLET | Freq: Four times a day (QID) | ORAL | Status: DC | PRN

## 2017-12-02 MED ORDER — CEFAZOLIN SODIUM-DEXTROSE 2-4 GM/100ML-% IV SOLN
2.0000 g | INTRAVENOUS | Status: AC
  Administered 2017-12-02: 2 g via INTRAVENOUS
  Filled 2017-12-02: qty 100

## 2017-12-02 MED ORDER — FENTANYL CITRATE (PF) 250 MCG/5ML IJ SOLN
INTRAMUSCULAR | Status: AC
  Filled 2017-12-02: qty 5

## 2017-12-02 MED ORDER — LOPERAMIDE HCL 2 MG PO CAPS: 4.0000 mg | ORAL_CAPSULE | Freq: Four times a day (QID) | ORAL | Status: DC | PRN

## 2017-12-02 MED ORDER — DIPHENHYDRAMINE HCL 12.5 MG/5ML PO ELIX: 12.5000 mg | ORAL_SOLUTION | Freq: Four times a day (QID) | ORAL | Status: DC | PRN

## 2017-12-02 MED ORDER — CALCIUM ACETATE (PHOS BINDER) 667 MG PO CAPS
1334.0000 mg | ORAL_CAPSULE | ORAL | Status: DC
  Administered 2017-12-02 – 2017-12-03 (×2): 1334 mg via ORAL
  Filled 2017-12-02: qty 2

## 2017-12-02 MED ORDER — HYDRALAZINE HCL 20 MG/ML IJ SOLN: 5.0000 mg | INTRAMUSCULAR | Status: DC | PRN

## 2017-12-02 MED ORDER — CALCIUM CARBONATE ANTACID 500 MG PO CHEW
CHEWABLE_TABLET | Freq: Two times a day (BID) | ORAL | Status: DC
  Administered 2017-12-02 – 2017-12-03 (×3): 200 mg via ORAL
  Filled 2017-12-02 (×3): qty 1

## 2017-12-02 MED ORDER — ONDANSETRON HCL 4 MG/2ML IJ SOLN
INTRAMUSCULAR | Status: DC | PRN
  Administered 2017-12-02: 4 mg via INTRAVENOUS

## 2017-12-02 MED ORDER — TRAMADOL HCL 50 MG PO TABS: 50.0000 mg | ORAL_TABLET | Freq: Four times a day (QID) | ORAL | 0 refills | Status: DC | PRN

## 2017-12-02 MED ORDER — PROPOFOL 10 MG/ML IV BOLUS
INTRAVENOUS | Status: AC
  Filled 2017-12-02: qty 20

## 2017-12-02 MED ORDER — LIDOCAINE-PRILOCAINE 2.5-2.5 % EX CREA
1.0000 "application " | TOPICAL_CREAM | CUTANEOUS | Status: DC | PRN
  Filled 2017-12-02: qty 5

## 2017-12-02 MED ORDER — METOPROLOL TARTRATE 5 MG/5ML IV SOLN: 5.0000 mg | Freq: Four times a day (QID) | INTRAVENOUS | Status: DC | PRN

## 2017-12-02 MED ORDER — ONDANSETRON HCL 4 MG/2ML IJ SOLN: 4.0000 mg | Freq: Four times a day (QID) | INTRAMUSCULAR | Status: DC | PRN

## 2017-12-02 MED ORDER — LISINOPRIL 10 MG PO TABS
10.0000 mg | ORAL_TABLET | Freq: Every day | ORAL | Status: DC
  Administered 2017-12-02: 10 mg via ORAL
  Filled 2017-12-02: qty 1

## 2017-12-02 MED ORDER — RENA-VITE PO TABS
1.0000 | ORAL_TABLET | Freq: Every day | ORAL | Status: DC
  Administered 2017-12-02 – 2017-12-03 (×2): 1 via ORAL
  Filled 2017-12-02 (×2): qty 1

## 2017-12-02 MED ORDER — GABAPENTIN 300 MG PO CAPS
300.0000 mg | ORAL_CAPSULE | Freq: Two times a day (BID) | ORAL | Status: DC
  Administered 2017-12-02 – 2017-12-03 (×3): 300 mg via ORAL
  Filled 2017-12-02 (×3): qty 1

## 2017-12-02 MED ORDER — SIMETHICONE 80 MG PO CHEW: 40.0000 mg | CHEWABLE_TABLET | Freq: Four times a day (QID) | ORAL | Status: DC | PRN

## 2017-12-02 MED ORDER — EPHEDRINE SULFATE 50 MG/ML IJ SOLN
INTRAMUSCULAR | Status: DC | PRN
  Administered 2017-12-02 (×3): 10 mg via INTRAVENOUS

## 2017-12-02 MED ORDER — CALCIUM ACETATE (PHOS BINDER) 667 MG PO CAPS
2001.0000 mg | ORAL_CAPSULE | Freq: Three times a day (TID) | ORAL | Status: DC
  Administered 2017-12-02 – 2017-12-03 (×3): 2001 mg via ORAL
  Filled 2017-12-02 (×3): qty 3

## 2017-12-02 MED ORDER — VITAMIN D 1000 UNITS PO TABS
1000.0000 [IU] | ORAL_TABLET | Freq: Every day | ORAL | Status: DC
  Administered 2017-12-02 – 2017-12-03 (×2): 1000 [IU] via ORAL
  Filled 2017-12-02 (×2): qty 1

## 2017-12-02 MED ORDER — POLYETHYLENE GLYCOL 3350 17 G PO PACK: 17.0000 g | PACK | Freq: Every day | ORAL | Status: DC | PRN

## 2017-12-02 MED ORDER — LIDOCAINE 2% (20 MG/ML) 5 ML SYRINGE
INTRAMUSCULAR | Status: AC
  Filled 2017-12-02: qty 5

## 2017-12-02 MED ORDER — DIPHENHYDRAMINE HCL 50 MG/ML IJ SOLN: 12.5000 mg | Freq: Four times a day (QID) | INTRAMUSCULAR | Status: DC | PRN

## 2017-12-02 MED ORDER — ENOXAPARIN SODIUM 30 MG/0.3ML ~~LOC~~ SOLN
30.0000 mg | SUBCUTANEOUS | Status: DC
  Administered 2017-12-03: 30 mg via SUBCUTANEOUS
  Filled 2017-12-02: qty 0.3

## 2017-12-02 MED ORDER — GLYCOPYRROLATE 0.2 MG/ML IJ SOLN
INTRAMUSCULAR | Status: DC | PRN
  Administered 2017-12-02: .8 mg via INTRAVENOUS

## 2017-12-02 MED ORDER — PROCHLORPERAZINE EDISYLATE 10 MG/2ML IJ SOLN: 5.0000 mg | Freq: Four times a day (QID) | INTRAMUSCULAR | Status: DC | PRN

## 2017-12-02 MED ORDER — ATORVASTATIN CALCIUM 40 MG PO TABS
40.0000 mg | ORAL_TABLET | Freq: Every day | ORAL | Status: DC
  Administered 2017-12-02 – 2017-12-03 (×2): 40 mg via ORAL
  Filled 2017-12-02 (×2): qty 1

## 2017-12-02 MED ORDER — 0.9 % SODIUM CHLORIDE (POUR BTL) OPTIME
TOPICAL | Status: DC | PRN
  Administered 2017-12-02: 2000 mL

## 2017-12-02 MED ORDER — METHOCARBAMOL 750 MG PO TABS: 750.0000 mg | ORAL_TABLET | Freq: Four times a day (QID) | ORAL | Status: DC | PRN

## 2017-12-02 MED ORDER — PHENYLEPHRINE 40 MCG/ML (10ML) SYRINGE FOR IV PUSH (FOR BLOOD PRESSURE SUPPORT)
PREFILLED_SYRINGE | INTRAVENOUS | Status: DC | PRN
  Administered 2017-12-02: 80 ug via INTRAVENOUS
  Administered 2017-12-02: 160 ug via INTRAVENOUS

## 2017-12-02 MED ORDER — METRONIDAZOLE IN NACL 5-0.79 MG/ML-% IV SOLN
500.0000 mg | INTRAVENOUS | Status: AC
  Administered 2017-12-02: 500 mg via INTRAVENOUS
  Filled 2017-12-02 (×2): qty 100

## 2017-12-02 MED ORDER — BUPIVACAINE-EPINEPHRINE (PF) 0.25% -1:200000 IJ SOLN
INTRAMUSCULAR | Status: AC
  Filled 2017-12-02: qty 90

## 2017-12-02 MED ORDER — DEXAMETHASONE SODIUM PHOSPHATE 10 MG/ML IJ SOLN
INTRAMUSCULAR | Status: AC
  Filled 2017-12-02: qty 1

## 2017-12-02 MED ORDER — SEVELAMER CARBONATE 2.4 G PO PACK
7.2000 g | PACK | Freq: Three times a day (TID) | ORAL | Status: DC
  Administered 2017-12-02 – 2017-12-03 (×3): 7.2 g via ORAL
  Filled 2017-12-02 (×4): qty 3

## 2017-12-02 MED ORDER — PHENYLEPHRINE 40 MCG/ML (10ML) SYRINGE FOR IV PUSH (FOR BLOOD PRESSURE SUPPORT)
PREFILLED_SYRINGE | INTRAVENOUS | Status: AC
  Filled 2017-12-02: qty 10

## 2017-12-02 SURGICAL SUPPLY — 59 items
APPLIER CLIP 5 13 M/L LIGAMAX5 (MISCELLANEOUS) ×3
BLADE CLIPPER SURG (BLADE) ×3 IMPLANT
CANISTER SUCT 3000ML PPV (MISCELLANEOUS) ×3 IMPLANT
CHLORAPREP W/TINT 26ML (MISCELLANEOUS) ×3 IMPLANT
CLIP APPLIE 5 13 M/L LIGAMAX5 (MISCELLANEOUS) ×1 IMPLANT
COVER MAYO STAND STRL (DRAPES) ×3 IMPLANT
COVER SURGICAL LIGHT HANDLE (MISCELLANEOUS) ×3 IMPLANT
COVER WAND RF STERILE (DRAPES) ×3 IMPLANT
DRAPE C-ARM 42X72 X-RAY (DRAPES) ×3 IMPLANT
DRAPE WARM FLUID 44X44 (DRAPE) ×3 IMPLANT
DRSG TEGADERM 2-3/8X2-3/4 SM (GAUZE/BANDAGES/DRESSINGS) ×6 IMPLANT
DRSG TEGADERM 4X4.75 (GAUZE/BANDAGES/DRESSINGS) ×3 IMPLANT
ELECT REM PT RETURN 9FT ADLT (ELECTROSURGICAL) ×3
ELECTRODE REM PT RTRN 9FT ADLT (ELECTROSURGICAL) ×1 IMPLANT
ENDOLOOP SUT PDS II  0 18 (SUTURE) ×4
ENDOLOOP SUT PDS II 0 18 (SUTURE) ×2 IMPLANT
GAUZE SPONGE 2X2 8PLY STRL LF (GAUZE/BANDAGES/DRESSINGS) ×3 IMPLANT
GLOVE BIO SURGEON STRL SZ 6.5 (GLOVE) ×2 IMPLANT
GLOVE BIO SURGEON STRL SZ7.5 (GLOVE) ×3 IMPLANT
GLOVE BIO SURGEONS STRL SZ 6.5 (GLOVE) ×1
GLOVE BIOGEL PI IND STRL 6.5 (GLOVE) ×1 IMPLANT
GLOVE BIOGEL PI IND STRL 7.0 (GLOVE) ×2 IMPLANT
GLOVE BIOGEL PI IND STRL 7.5 (GLOVE) ×1 IMPLANT
GLOVE BIOGEL PI INDICATOR 6.5 (GLOVE) ×2
GLOVE BIOGEL PI INDICATOR 7.0 (GLOVE) ×4
GLOVE BIOGEL PI INDICATOR 7.5 (GLOVE) ×2
GLOVE ECLIPSE 8.0 STRL XLNG CF (GLOVE) ×3 IMPLANT
GLOVE INDICATOR 8.0 STRL GRN (GLOVE) ×3 IMPLANT
GOWN STRL REUS W/ TWL LRG LVL3 (GOWN DISPOSABLE) ×2 IMPLANT
GOWN STRL REUS W/ TWL XL LVL3 (GOWN DISPOSABLE) ×1 IMPLANT
GOWN STRL REUS W/TWL LRG LVL3 (GOWN DISPOSABLE) ×4
GOWN STRL REUS W/TWL XL LVL3 (GOWN DISPOSABLE) ×2
IV NS 1000ML (IV SOLUTION) ×6
IV NS 1000ML BAXH (IV SOLUTION) ×3 IMPLANT
KIT BASIN OR (CUSTOM PROCEDURE TRAY) ×3 IMPLANT
KIT TURNOVER KIT B (KITS) ×3 IMPLANT
NEEDLE 22X1 1/2 (OR ONLY) (NEEDLE) ×3 IMPLANT
NEEDLE INSUFFLATION 14GA 120MM (NEEDLE) ×3 IMPLANT
NS IRRIG 1000ML POUR BTL (IV SOLUTION) ×3 IMPLANT
PAD ARMBOARD 7.5X6 YLW CONV (MISCELLANEOUS) ×6 IMPLANT
POUCH SPECIMEN RETRIEVAL 10MM (ENDOMECHANICALS) ×3 IMPLANT
SCISSORS LAP 5X35 DISP (ENDOMECHANICALS) ×3 IMPLANT
SET CHOLANGIOGRAPH 5 50 .035 (SET/KITS/TRAYS/PACK) ×3 IMPLANT
SET IRRIG TUBING LAPAROSCOPIC (IRRIGATION / IRRIGATOR) ×3 IMPLANT
SHEARS HARMONIC ACE PLUS 36CM (ENDOMECHANICALS) ×3 IMPLANT
SLEEVE ENDOPATH XCEL 5M (ENDOMECHANICALS) ×6 IMPLANT
SPECIMEN JAR SMALL (MISCELLANEOUS) ×3 IMPLANT
SPONGE GAUZE 2X2 STER 10/PKG (GAUZE/BANDAGES/DRESSINGS) ×6
SUT MNCRL AB 4-0 PS2 18 (SUTURE) ×3 IMPLANT
SUT PDS AB 1 CT  36 (SUTURE) ×4
SUT PDS AB 1 CT 36 (SUTURE) ×2 IMPLANT
SUT VICRYL 0 TIES 12 18 (SUTURE) IMPLANT
TOWEL OR 17X26 10 PK STRL BLUE (TOWEL DISPOSABLE) ×3 IMPLANT
TRAY LAPAROSCOPIC MC (CUSTOM PROCEDURE TRAY) ×3 IMPLANT
TROCAR 5M 150ML BLDLS (TROCAR) IMPLANT
TROCAR BLADELESS 11MM (ENDOMECHANICALS) ×3 IMPLANT
TROCAR XCEL NON-BLD 5MMX100MML (ENDOMECHANICALS) ×3 IMPLANT
TUBING INSUFFLATION (TUBING) ×3 IMPLANT
WATER STERILE IRR 1000ML POUR (IV SOLUTION) ×3 IMPLANT

## 2017-12-02 NOTE — H&P (Signed)
Sean Best DOB: August 05, 1933 Married / Language: Cleophus Molt / Race: Black or African American Male  Patient Care Team: Seward Carol, MD as PCP - General (Internal Medicine) Evans Lance, MD as PCP - Electrophysiology (Cardiology) Evans Lance, MD as PCP - Cardiology (Cardiology) Clent Jacks, MD (Ophthalmology) Elmarie Shiley, MD as Consulting Physician (Nephrology) Michael Boston, MD as Consulting Physician (General Surgery)    Patient sent for surgical consultation at the request of Dr Seward Carol  Chief Complaint: Right-sided abdominal pain and vomiting with gallstones. ` ` The patient is a pleasant gentleman. Comes today with his daughter. Many health issues. Has a pacemaker. Followed by Dr. Lawerance Cruel [facility] cardiology. No major cardiac events. Renal failure. Dialysis-dependent. He's had peritoneal dialysis catheters placed and removed twice. An umbilical hernia repaired. Then developed recurrence and had to have her repaired again with mesh. My partner, Dr. Excell Seltzer, did the second PD catheter in first umbilical hernia repair in 2015. Apparently the pelvis dialysis catheter did not work well. Patient went to [facility] where the PD catheter had to be removed and hernia repair redone in 2017. He is now on hemodialysis. Gets it Monday Wednesday Friday on [employer]. [facility]. Daughter is more involved in his care now. Patient himself is somewhat forgetful.  Patient has had a few episodes this past year of severe upper abdominal pain. Usually right-sided. Vomiting. First-time happened last winter. [facility]. Gallstones noted. Apparently the story seems somewhat equivocal. Then he had another attack last month. Much more suspicious for biliary colic. Had more gallbladder wall thickening. More stones. She does have a history of heartburn. However this felt different in that. She is with dialysis or  heart issues. Cardiac workup negative. No change in bowel habits. Usually moves his bowels every day. He is oliguric. He can only walk a block or 2 with his cane before he has to stop due to dyspnea. No severe exertional chest pain.  Cleared by cardiology.  Ready for surgery  (Review of systems as stated in this history (HPI) or in the review of systems. Otherwise all other 12 point ROS are negative) ` ` `   Past Surgical History (Tanisha A. Owens Shark, [place]; 10/12/2017 10:20 AM) Cataract Surgery  Bilateral, Left. Dialysis Shunt / Fistula  Open Inguinal Hernia Surgery  Bilateral. multiple TURP   Diagnostic Studies History (Tanisha A. Owens Shark, [place]; 10/12/2017 10:20 AM) Colonoscopy  1-5 years ago  Allergies (Tanisha A. Owens Shark, [place]; 10/12/2017 10:21 AM) No Known Drug Allergies [10/12/2017]: Allergies Reconciled   Medication History (Tanisha A. Owens Shark, RMA; 10/12/2017 10:22 AM) Allopurinol (100MG  Tablet, Oral) Active. AmLODIPine Besylate (5MG  Tablet, Oral) Active. Atorvastatin Calcium (40MG  Tablet, Oral) Active. Gabapentin (100MG  Capsule, Oral) Active. Lisinopril (10MG  Tablet, Oral) Active. Rena-Vite (Oral) Active. Sevelamer Carbonate (2.4GM Packet, Oral) Active. Medications Reconciled  Social History (Tanisha A. Owens Shark, [place]; 10/12/2017 10:20 AM) Caffeine use  Carbonated beverages, Coffee, Tea. No alcohol use  No drug use  Tobacco use  Never smoker.  Family History (Tanisha A. Owens Shark, [place]; 10/12/2017 10:20 AM) Alcohol Abuse  Brother, Father, Son. Anesthetic complications  Family Members In General. Arthritis  Daughter, Mother. Breast Cancer  Mother, Sister. Cancer  Brother, Father, Sister, Son. Cerebrovascular Accident  Mother. Cervical Cancer  Sister. Colon Cancer  Sister. Colon Polyps  Family Members In General. Depression  Daughter. Diabetes Mellitus  Brother, Mother, Sister. Heart Disease  Mother. Heart disease in male family member before age  32  Hypertension  Brother, Daughter, Mother, Sister, Son. Malignant Neoplasm Of Pancreas  Mother, Pandora Leiter, Sister. Migraine Headache  Daughter. Ovarian Cancer  Sister. Rectal Cancer  Sister.  Other Problems (Tanisha A. Owens Shark, [place]; 10/12/2017 10:20 AM) Arthritis  Back Pain  Cerebrovascular Accident  Chronic Renal Failure Syndrome  Diabetes Mellitus  Gastroesophageal Reflux Disease  Heart murmur  High blood pressure  Hypercholesterolemia  Inguinal Hernia  Other disease, cancer, significant illness  Prostate Cancer  Sleep Apnea     Review of Systems (Tanisha A. Brown RMA; 10/12/2017 10:20 AM) General Present- Chills, Fatigue and Weight Loss. Not Present- Appetite Loss, Fever, Night Sweats and Weight Gain. Skin Present- Dryness. Not Present- Change in Wart/Mole, Hives, Jaundice, New Lesions, Non-Healing Wounds, Rash and Ulcer. HEENT Present- Hearing Loss, Seasonal Allergies and Wears glasses/contact lenses. Not Present- Earache, Hoarseness, Nose Bleed, Oral Ulcers, Ringing in the Ears, Sinus Pain, Sore Throat, Visual Disturbances and Yellow Eyes. Respiratory Present- Chronic Cough and Snoring. Not Present- Bloody sputum, Difficulty Breathing and Wheezing. Cardiovascular Present- Difficulty Breathing Lying Down, Leg Cramps and Swelling of Extremities. Not Present- Chest Pain, Palpitations, Rapid Heart Rate and Shortness of Breath. Gastrointestinal Present- Abdominal Pain, Indigestion, Nausea and Vomiting. Not Present- Bloating, Bloody Stool, Change in Bowel Habits, Chronic diarrhea, Constipation, Difficulty Swallowing, Excessive gas, Gets full quickly at meals, Hemorrhoids and Rectal Pain. Musculoskeletal Present- Back Pain, Joint Pain, Joint Stiffness, Muscle Pain, Muscle Weakness and Swelling of Extremities. Neurological Present- Decreased Memory, Numbness, Tremor, Trouble walking and Weakness. Not Present- Fainting, Headaches, Seizures and Tingling. Psychiatric Not  Present- Anxiety, Bipolar, Change in Sleep Pattern, Depression, Fearful and Frequent crying.  Vitals (Tanisha A. Brown RMA; 10/12/2017 10:21 AM) 10/12/2017 10:20 AM Weight: 196.8 lb Height: 65in Body Surface Area: 1.96 m Body Mass Index: 32.75 kg/m  Temp.: 98.47F  BP: 132/82 (Sitting, Left Arm, Standard)    BP (!) 173/99   Pulse 95   Temp 98.3 F (36.8 C) (Oral)   Resp 20   Ht 5\' 5"  (1.651 m)   Wt 89.4 kg   SpO2 97%   BMI 32.78 kg/m    Physical Exam   General Mental Status-Alert. General Appearance-Not in acute distress, Not Sickly. Orientation-Oriented X3. Hydration-Well hydrated. Voice-Normal.  Integumentary Global Assessment Upon inspection and palpation of skin surfaces of the - Axillae: non-tender, no inflammation or ulceration, no drainage. and Distribution of scalp and body hair is normal. General Characteristics Temperature - normal warmth is noted.  Head and Neck Head-normocephalic, atraumatic with no lesions or palpable masses. Face Global Assessment - atraumatic, no absence of expression. Neck Global Assessment - no abnormal movements, no bruit auscultated on the right, no bruit auscultated on the left, no decreased range of motion, non-tender. Trachea-midline. Thyroid Gland Characteristics - non-tender.  Eye Eyeball - Left-Extraocular movements intact, No Nystagmus. Eyeball - Right-Extraocular movements intact, No Nystagmus. Cornea - Left-No Hazy. Cornea - Right-No Hazy. Sclera/Conjunctiva - Left-No scleral icterus, No Discharge. Sclera/Conjunctiva - Right-No scleral icterus, No Discharge. Pupil - Left-Direct reaction to light normal. Pupil - Right-Direct reaction to light normal.  ENMT Ears Pinna - Left - no drainage observed, no generalized tenderness observed. Right - no drainage observed, no generalized tenderness observed. Nose and Sinuses External Inspection of the Nose - no destructive lesion  observed. Inspection of the nares - Left - quiet respiration. Right - quiet respiration. Mouth and Throat Lips - Upper Lip - no fissures observed, no pallor noted. Lower Lip - no fissures observed, no pallor noted. Nasopharynx - no discharge present. Oral  Cavity/Oropharynx - Tongue - no dryness observed. Oral Mucosa - no cyanosis observed. Hypopharynx - no evidence of airway distress observed.  Chest and Lung Exam Inspection Movements - Normal and Symmetrical. Accessory muscles - No use of accessory muscles in breathing. Palpation Palpation of the chest reveals - Non-tender. Auscultation Breath sounds - Normal and Clear.  Cardiovascular Auscultation Rhythm - Regular. Murmurs & Other Heart Sounds - Auscultation of the heart reveals - No Murmurs and No Systolic Clicks.  Abdomen Inspection Inspection of the abdomen reveals - No Visible peristalsis and No Abnormal pulsations. Umbilicus - No Bleeding, No Urine drainage. Palpation/Percussion Palpation and Percussion of the abdomen reveal - Soft, Non Tender, No Rebound tenderness, No Rigidity (guarding) and No Cutaneous hyperesthesia. Note: Abdomen send out. Numerous incisions consistent with prior peritoneal dialysis catheter placements and removal 2 as well as umbilical hernia repair and recurrent repair. 2 x 2 supraumbilical fatty mass suspicious for small recurrent incarcerated periumbilical ventral hernia fixed. Not mobile. Nontender. Mild right upper quadrant discomfort but no Rottinghaus sign. Not distended. No guarding.   Male Genitourinary Sexual Maturity Tanner 5 - Adult hair pattern and Adult penile size and shape. Note: Minimal discomfort in right lower quadrant but no inguinal hernias.   Peripheral Vascular Upper Extremity Inspection - Left - No Cyanotic nailbeds, Not Ischemic. Right - No Cyanotic nailbeds, Not Ischemic.  Neurologic Neurologic evaluation reveals -normal attention span and ability to concentrate, able to  name objects and repeat phrases. Appropriate fund of knowledge , normal sensation and normal coordination. Mental Status Affect - not angry, not paranoid. Cranial Nerves-Normal Bilaterally. Gait-Normal.  Neuropsychiatric Mental status exam performed with findings of-able to articulate well with normal speech/language, rate, volume and coherence, thought content normal with ability to perform basic computations and apply abstract reasoning and no evidence of hallucinations, delusions, obsessions or homicidal/suicidal ideation. Note: Some fair recall and memory   Musculoskeletal Global Assessment Spine, Ribs and Pelvis - no instability, subluxation or laxity. Right Upper Extremity - no instability, subluxation or laxity. Note: Large left forearm AV fistula with good thrill   Lymphatic Head & Neck  General Head & Neck Lymphatics: Bilateral - Description - No Localized lymphadenopathy. Axillary  General Axillary Region: Bilateral - Description - No Localized lymphadenopathy. Femoral & Inguinal  Generalized Femoral & Inguinal Lymphatics: Left - Description - No Localized lymphadenopathy. Right - Description - No Localized lymphadenopathy.    Assessment & Plan  CHRONIC CHOLECYSTITIS WITH CALCULUS (K80.10) Impression: At least 2 attacks suspicious for biliary colic. More thickening and more stones on the most recent ultrasound. The rest of the differential diagnosis seems unlikely.  Standard of care would be cholecystectomy.  The child with him is his health issues. Would like to get cardiac clearance from Dr. Crissie Sickles. Hopefully just holding the pacemaker perioperatively with the help of the [employer]tr[employer] rep.  Would plan to do this on an off dialysis day. He gets dialysis Monday Wednesday Friday I believe on [employer]. Followed by Dr. Posey Pronto with nephrology. Most likely would do at [facility] on Tuesday/Thursday and discharged the following day after dialysis if  cardiology and nephrology agree. I did caution the patient and his daughter that his risks of longer stay or other issues is elevated, but he seems to have tolerated other abdominal surgeries without much incident. His cardiac & kidney failure issues have been rather stable.   The anatomy & physiology of hepatobiliary & pancreatic function was discussed. The pathophysiology of gallbladder dysfunction was discussed. Natural  history risks without surgery was discussed. I feel the risks of no intervention will lead to serious problems that outweigh the operative risks; therefore, I recommended cholecystectomy to remove the pathology. I explained laparoscopic techniques with possible need for an open approach. Probable cholangiogram to evaluate the bilary tract was explained as well.  Risks such as bleeding, infection, abscess, leak, injury to other organs, need for further treatment, heart attack, death, and other risks were discussed. I noted a good likelihood this will help address the problem. Possibility that this will not correct all abdominal symptoms was explained. Goals of post-operative recovery were discussed as well. We will work to minimize complications. An educational handout further explaining the pathology and treatment options was given as well. Questions were answered. The patient expresses understanding & wishes to proceed with surgery.  Adin Hector, MD, FACS, MASCRS Gastrointestinal and Minimally Invasive Surgery    [address], [place], [place] [address] [phone] Main / Paging [phone] Fax

## 2017-12-02 NOTE — Consult Note (Signed)
Renal Service Consult Note [facility]  Sean Best 12/02/2017 Sol Blazing Requesting Physician:  Dr Johney Maine   Reason for Consult:  ESRD pt s/p lap chole HPI: The patient is a 84 y.o. year-old w/ hx of DM2, CVA, prostate Ca, HTN, PPM,  HL, gout, ESRD on HD MWF who was admitted for laparscopic cholecystectomy which was done earlier today.  Asked to see for dialysis.   Pt has had recent hsitory this year of attacks of upper abd pain. Was found to have gallstones at an ED visit. Felt to have biliary colic. Cleared by cardiology.   Pt did PD from approx 2015 - 2017.  1st PD cath became infected, the 2nd placed 2017 didn't work well and pt was put on hemodialysis. Takes HD now MWF w/o any recent issues.   ROS  denies CP  no joint pain   no HA  no blurry vision  no rash  no diarrhea  no nausea/ vomiting   Past Medical History  Past Medical History:  Diagnosis Date  . Anemia   . Arthritis    "knees" (04/11/2016)  . Blind left eye    S/P trauma  . Chronic total occlusion of artery of the extremities (HCC)    pt not aware of this  . Claudication ([place])   . ESRD on dialysis [facility])    "MWF; [name]" ((04/10/2016)  . ESRD on peritoneal dialysis [facility])    Started dialysis around April 2015 per son.  Has been doing peritoneal dialysis at home.     . Gout   . Hyperlipidemia   . Hypertension   . Overweight(278.02)   . Presence of permanent cardiac pacemaker    medtronic  . Prostate cancer ([place]) 1990s  . Shingles   . Stroke [facility]) ~1998   denies residual on 04/11/2016  . Type II diabetes mellitus (HCC)    diet controlled  . Umbilical hernia 7/85/8850   Past Surgical History  Past Surgical History:  Procedure Laterality Date  . AV FISTULA PLACEMENT, RADIOCEPHALIC  27/74/1287   Left arm  . CAPD INSERTION N/A 03/06/2013   Procedure: LAPAROSCOPIC INSERTION CONTINUOUS AMBULATORY PERITONEAL DIALYSIS  (CAPD) CATHETER;  Surgeon: Edward Jolly, MD;  Location: [facility];  Service: General;  Laterality: N/A;  . CATARACT EXTRACTION Right   . CHOLECYSTECTOMY  12/02/2017   LAPROSCOPIC  . EYE SURGERY Left 1970s   for eye injury  . HERNIA REPAIR    . INSERTION PROSTATE RADIATION SEED    . PACEMAKER IMPLANT N/A 04/20/2017   Procedure: PACEMAKER IMPLANT;  Surgeon: Evans Lance, MD;  Location: [facility];  Service: Cardiovascular;  Laterality: N/A;  . UMBILICAL HERNIA REPAIR N/A 03/06/2013   Procedure: HERNIA REPAIR UMBILICAL WITH MESH;  Surgeon: Edward Jolly, MD;  Location: [facility] OR;  Service: General;  Laterality: N/A;   Family History  Family History  Problem Relation Age of Onset  . Cancer Mother   . Heart disease Mother   . Cancer Father    Social History  reports that he has never smoked. He has never used smokeless tobacco. He reports that he does not drink alcohol or use drugs. Allergies  Allergies  Allergen Reactions  . Cardura [Doxazosin Mesylate] Other (See Comments)    Hallucinations   Home medications Prior to Admission medications   Medication Sig Start Date End Date Taking? Authorizing Provider  allopurinol (ZYLOPRIM) 100 MG tablet Take 100 mg by mouth daily.  Yes [provider]  amLODipine (NORVASC) 10 MG tablet Take 10 mg by mouth at bedtime.  01/04/17  Yes [provider]  atorvastatin (LIPITOR) 40 MG tablet Take 40 mg by mouth daily. 07/07/17  Yes [provider]  calcium acetate (PHOSLO) 667 MG capsule Take 1,334-2,001 mg by mouth See admin instructions. Take 2001 mg by mouth 3 times daily with meals and take 1344 mg by mouth with snacks   Yes [provider]  calcium carbonate (TUMS EX) 750 MG chewable tablet Chew 4 tablets by mouth 2 (two) times daily.   Yes [provider]  Cholecalciferol (VITAMIN D-3) 1000 units CAPS Take 1,000 Units by mouth daily.   Yes [provider]  gabapentin (NEURONTIN) 100 MG capsule Take 100 mg by mouth at bedtime. 12/27/16  Yes  [provider]  ibuprofen (ADVIL,MOTRIN) 200 MG tablet Take 400 mg by mouth every 6 (six) hours as needed for headache or moderate pain.   Yes [provider]  lidocaine-prilocaine (EMLA) cream Apply 1 application topically as needed (for port access).  10/28/17  Yes [provider]  lisinopril (PRINIVIL,ZESTRIL) 10 MG tablet Take 10 mg by mouth at bedtime. 03/25/17  Yes [provider]  loperamide (IMODIUM A-D) 2 MG tablet Take 4 mg by mouth 4 (four) times daily as needed for diarrhea or loose stools.   Yes [provider]  multivitamin (RENA-VIT) TABS tablet Take 1 tablet by mouth daily.   Yes [provider]  sevelamer carbonate (RENVELA) 2.4 g PACK Take 7.2 g by mouth 3 (three) times daily with meals.  10/13/17  Yes [provider]  traMADol (ULTRAM) 50 MG tablet Take 1 tablet (50 mg total) by mouth every 6 (six) hours as needed for moderate pain or severe pain. 12/02/17   Michael Boston, MD   Liver Function Tests No results for input(s): AST, ALT, ALKPHOS, BILITOT, PROT, ALBUMIN in the last 168 hours. No results for input(s): LIPASE, AMYLASE in the last 168 hours. CBC Recent Labs  Lab 12/02/17 0616  HGB 10.5*  HCT 67.6*   Basic Metabolic Panel Recent Labs  Lab 12/02/17 0616  NA 140  K 4.1  GLUCOSE 94   Iron/TIBC/Ferritin/ %Sat    Component Value Date/Time   IRON 20 (L) 03/15/2013 1232   TIBC 164 (L) 03/15/2013 1232   IRONPCTSAT 12 (L) 03/15/2013 1232    Vitals:   12/02/17 1130 12/02/17 1200 12/02/17 1230 12/02/17 1303  BP: 130/75 139/81 128/77 127/78  Pulse: 78 78 80 81  Resp: 12 14 14 18   Temp: (!) 97.5 F (36.4 C)   97.7 F (36.5 C)  TempSrc:    Oral  SpO2: 95% 98% 98% 94%  Weight:      Height:       Exam Gen elderly AAM no distress No rash, cyanosis or gangrene Sclera anicteric, throat clear  No jvd or bruits Chest clear bilat no rales or wheezing RRR no MRG  Abd soft ntnd no mass or ascites  +bs GU normal male MS no joint effusions or deformity Ext no sig LE edema, no wounds or ulcers Neuro is alert, Ox 3 , nf L forearm AVF + bruit    Home meds:  - amlodipine 10/ lisinopril 10 hs  - allopurinol 100/ atorvastatin 40 / gabapentin 100 hs  - sevelamer carbonate 7.2gm tid ac/ calcium acetate 2-3 ac tid  - prns/ vitamins   Dialysis: MWF [facility]  4h   89kg  2/2 bath  LFA AVF   Hep 6000  - cinacalcet 150 po tiw  - mircera 60 every 2 wks   - vit d 1 ug tiw     Impression: 1. SP lap chole - today, 07-6 w/o complications 2. ESRD - on HD MWF. Due for HD tomorrow. Stable vol / lytes 3. HTN - cont home meds 4. Gout 5. HL  6. Anemia of ckd - Hb 10.5, stable   Plan - HD 1st shift tomorrow, min UF, no heparin  Kelly Splinter MD [place] pager [phone]   12/02/2017, 2:46 PM

## 2017-12-02 NOTE — Discharge Instructions (Signed)
LAPAROSCOPIC SURGERY: POST OP INSTRUCTIONS ° °###################################################################### ° °EAT °Gradually transition to a high fiber diet with a fiber supplement over the next few weeks after discharge.  Start with a pureed / full liquid diet (see below) ° °WALK °Walk an hour a day.  Control your pain to do that.   ° °CONTROL PAIN °Control pain so that you can walk, sleep, tolerate sneezing/coughing, go up/down stairs. ° °HAVE A BOWEL MOVEMENT DAILY °Keep your bowels regular to avoid problems.  OK to try a laxative to override constipation.  OK to use an antidairrheal to slow down diarrhea.  Call if not better after 2 tries ° °CALL IF YOU HAVE PROBLEMS/CONCERNS °Call if you are still struggling despite following these instructions. °Call if you have concerns not answered by these instructions ° °###################################################################### ° ° ° °1. DIET: Follow a light bland diet the first 24 hours after arrival home, such as soup, liquids, crackers, etc.  Be sure to include lots of fluids daily.  Avoid fast food or heavy meals as your are more likely to get nauseated.  Eat a low fat the next few days after surgery.   °2. Take your usually prescribed home medications unless otherwise directed. °3. PAIN CONTROL: °a. Pain is best controlled by a usual combination of three different methods TOGETHER: °i. Ice/Heat °ii. Over the counter pain medication °iii. Prescription pain medication °b. Most patients will experience some swelling and bruising around the incisions.  Ice packs or heating pads (30-60 minutes up to 6 times a day) will help. Use ice for the first few days to help decrease swelling and bruising, then switch to heat to help relax tight/sore spots and speed recovery.  Some people prefer to use ice alone, heat alone, alternating between ice & heat.  Experiment to what works for you.  Swelling and bruising can take several weeks to resolve.   °c. It is  helpful to take an over-the-counter pain medication regularly for the first few weeks.  Choose one of the following that works best for you: °i. Naproxen (Aleve, etc)  Two 220mg tabs twice a day °ii. Ibuprofen (Advil, etc) Three 200mg tabs four times a day (every meal & bedtime) °iii. Acetaminophen (Tylenol, etc) 500-650mg four times a day (every meal & bedtime) °d. A  prescription for pain medication (such as oxycodone, hydrocodone, etc) should be given to you upon discharge.  Take your pain medication as prescribed.  °i. If you are having problems/concerns with the prescription medicine (does not control pain, nausea, vomiting, rash, itching, etc), please call us [phone] to see if we need to switch you to a different pain medicine that will work better for you and/or control your side effect better. °ii. If you need a refill on your pain medication, please contact your pharmacy.  They will contact our office to request authorization. Prescriptions will not be filled after 5 pm or on week-ends. °4. Avoid getting constipated.  Between the surgery and the pain medications, it is common to experience some constipation.  Increasing fluid intake and taking a fiber supplement (such as Metamucil, Citrucel, FiberCon, MiraLax, etc) 1-2 times a day regularly will usually help prevent this problem from occurring.  A mild laxative (prune juice, Milk of Magnesia, MiraLax, etc) should be taken according to package directions if there are no bowel movements after 48 hours.   °5. Watch out for diarrhea.  If you have many loose bowel movements, simplify your diet to bland foods & liquids for   a few days.  Stop any stool softeners and decrease your fiber supplement.  Switching to mild anti-diarrheal medications (Kayopectate, Pepto Bismol) can help.  If this worsens or does not improve, please call us. °6. Wash / shower every day.  You may shower over the dressings as they are waterproof.  Continue to shower over incision(s)  after the dressing is off. °7. Remove your waterproof bandages 5 days after surgery.  You may leave the incision open to air.  You may replace a dressing/Band-Aid to cover the incision for comfort if you wish.  °8. ACTIVITIES as tolerated:   °a. You may resume regular (light) daily activities beginning the next day--such as daily self-care, walking, climbing stairs--gradually increasing activities as tolerated.  If you can walk 30 minutes without difficulty, it is safe to try more intense activity such as jogging, treadmill, bicycling, low-impact aerobics, swimming, etc. °b. Save the most intensive and strenuous activity for last such as sit-ups, heavy lifting, contact sports, etc  Refrain from any heavy lifting or straining until you are off narcotics for pain control.   °c. DO NOT PUSH THROUGH PAIN.  Let pain be your guide: If it hurts to do something, don't do it.  Pain is your body warning you to avoid that activity for another week until the pain goes down. °d. You may drive when you are no longer taking prescription pain medication, you can comfortably wear a seatbelt, and you can safely maneuver your car and apply brakes. °e. You may have sexual intercourse when it is comfortable.  °9. FOLLOW UP in our office °a. Please call [facility] at [phone] to set up an appointment to see your surgeon in the office for a follow-up appointment approximately 2-3 weeks after your surgery. °b. Make sure that you call for this appointment the day you arrive home to insure a convenient appointment time. °10. IF YOU HAVE DISABILITY OR FAMILY LEAVE FORMS, BRING THEM TO THE OFFICE FOR PROCESSING.  DO NOT GIVE THEM TO YOUR DOCTOR. ° ° °WHEN TO CALL US [phone]: °1. Poor pain control °2. Reactions / problems with new medications (rash/itching, nausea, etc)  °3. Fever over 101.5 F (38.5 C) °4. Inability to urinate °5. Nausea and/or vomiting °6. Worsening swelling or bruising °7. Continued bleeding from incision. °8. Increased  pain, redness, or drainage from the incision ° ° The clinic staff is available to answer your questions during regular business hours (8:30am-5pm).  Please don’t hesitate to call and ask to speak to one of our nurses for clinical concerns.  ° If you have a medical emergency, go to the nearest emergency room or call 911. ° A surgeon from [facility] is always on call at the hospitals ° ° °[facility] °[address], [address], [place], [place]  [address] ? °MAIN: [phone] ? TOLL FREE: [phone] ?  °FAX [phone] °[email or web address] ° ° ° °Cholecystitis °Cholecystitis is inflammation of the gallbladder. It is often called a gallbladder attack. The gallbladder is a pear-shaped organ that lies beneath the liver on the right side of the body. The gallbladder stores bile, which is a fluid that helps the body to digest fats. If bile builds up in your gallbladder, your gallbladder becomes inflamed. This condition may occur suddenly (be acute). Repeat episodes of acute cholecystitis or prolonged episodes may lead to a long-term (chronic) condition. Cholecystitis is serious and it requires treatment. °What are the causes? °The most common cause of this   condition is gallstones. Gallstones can block the tube (duct) that carries bile out of your gallbladder. This causes bile to build up. Other causes of this condition include: °· Damage to the gallbladder due to a decrease in blood flow. °· Infections in the bile ducts. °· Scars or kinks in the bile ducts. °· Tumors in the liver, pancreas, or gallbladder. ° °What increases the risk? °This condition is more likely to develop in: °· People who have sickle cell disease. °· People who take birth control pills or use estrogen. °· People who have alcoholic liver disease. °· People who have liver cirrhosis. °· People who have their nutrition delivered through a vein (parenteral nutrition). °· People who do not eat or drink  (do fasting) for a long period of time. °· People who are obese. °· People who have rapid weight loss. °· People who are pregnant. °· People who have increased triglyceride levels. °· People who have pancreatitis. ° °What are the signs or symptoms? °Symptoms of this condition include: °· Abdominal pain, especially in the upper right area of the abdomen. °· Abdominal tenderness or bloating. °· Nausea. °· Vomiting. °· Fever. °· Chills. °· Yellowing of the skin and the whites of the eyes (jaundice). ° °How is this diagnosed? °This condition is diagnosed with a medical history and physical exam. You may also have other tests, including: °· Imaging tests, such as: °? An ultrasound of the gallbladder. °? A CT scan of the abdomen. °? A gallbladder nuclear scan (HIDA scan). This scan allows your health care provider to see the bile moving from your liver to your gallbladder and to your small intestine. °? MRI. °· Blood tests, such as: °? A complete blood count, because the white blood cell count may be higher than normal. °? Liver function tests, because some levels may be higher than normal with certain types of gallstones. ° °How is this treated? °Treatment may include: °· Fasting for a certain amount of time. °· IV fluids. °· Medicine to treat pain or vomiting. °· Antibiotic medicine. °· Surgery to remove your gallbladder (cholecystectomy). This may happen immediately or at a later time. ° °Follow these instructions at home: °Home care will depend on your treatment. In general: °· Take over-the-counter and prescription medicines only as told by your health care provider. °· If you were prescribed an antibiotic medicine, take it as told by your health care provider. Do not stop taking the antibiotic even if you start to feel better. °· Follow instructions from your health care provider about what to eat or drink. When you are allowed to eat, avoid eating or drinking anything that triggers your symptoms. °· Keep all  follow-up visits as told by your health care provider. This is important. ° °Contact a health care provider if: °· Your pain is not controlled with medicine. °· You have a fever. °Get help right away if: °· Your pain moves to another part of your abdomen or to your back. °· You continue to have symptoms or you develop new symptoms even with treatment. °This information is not intended to replace advice given to you by your health care provider. Make sure you discuss any questions you have with your health care provider. °Document Released: 02/16/2005 Document Revised: 06/27/2015 Document Reviewed: 05/30/2014 °Elsevier Interactive Patient Education © 2018 [employer] ° °

## 2017-12-02 NOTE — Anesthesia Procedure Notes (Signed)
Procedure Name: Intubation Date/Time: 12/02/2017 7:36 AM Performed by: Bryson Corona, CRNA Pre-anesthesia Checklist: Patient identified, Emergency Drugs available, Suction available and Patient being monitored Patient Re-evaluated:Patient Re-evaluated prior to induction Oxygen Delivery Method: Circle System Utilized Preoxygenation: Pre-oxygenation with 100% oxygen Induction Type: IV induction Ventilation: Two handed mask ventilation required and Oral airway inserted - appropriate to patient size Laryngoscope Size: Mac and 4 Grade View: Grade I Tube type: Oral Tube size: 7.0 mm Number of attempts: 1 Airway Equipment and Method: Stylet and Oral airway Placement Confirmation: ETT inserted through vocal cords under direct vision,  positive ETCO2 and breath sounds checked- equal and bilateral Secured at: 21 cm Tube secured with: Tape Dental Injury: Teeth and Oropharynx as per pre-operative assessment

## 2017-12-02 NOTE — Transfer of Care (Signed)
Immediate Anesthesia Transfer of Care Note  Patient: Sean Best  Procedure(s) Performed: LAPAROSCOPIC CHOLECYSTECTOMY (N/A Abdomen)  Patient Location: PACU  Anesthesia Type:General  Level of Consciousness: drowsy  Airway & Oxygen Therapy: Patient Spontanous Breathing and Patient connected to nasal cannula oxygen  Post-op Assessment: Report given to RN and Post -op Vital signs reviewed and stable  Post vital signs: Reviewed and stable  Last Vitals:  Vitals Value Taken Time  BP 136/73 12/02/2017  9:28 AM  Temp    Pulse 85 12/02/2017  9:28 AM  Resp 15 12/02/2017  9:28 AM  SpO2 100 % 12/02/2017  9:28 AM  Vitals shown include unvalidated device data.  Last Pain:  Vitals:   12/02/17 0630  TempSrc:   PainSc: 0-No pain      Patients Stated Pain Goal: 1 (94/32/76 1470)  Complications: No apparent anesthesia complications

## 2017-12-02 NOTE — Op Note (Addendum)
12/02/2017  PATIENT:  Sean Best  84 y.o. male  Patient Care Team: Seward Carol, MD as PCP - General (Internal Medicine) Evans Lance, MD as PCP - Electrophysiology (Cardiology) Evans Lance, MD as PCP - Cardiology (Cardiology) Clent Jacks, MD (Ophthalmology) Elmarie Shiley, MD as Consulting Physician (Nephrology) Michael Boston, MD as Consulting Physician (General Surgery)  PRE-OPERATIVE DIAGNOSIS:    Chronic Calculus cholecystitis  POST-OPERATIVE DIAGNOSIS:   Acute on Chronic Calculus Cholecystitis   PROCEDURE:  Laparoscopic cholecystectomy  SURGEON:  Adin Hector, MD, FACS.  ASSISTANT: Ave Filter, PA-S, [employer]   ANESTHESIA:    General with endotracheal intubation Local anesthetic as a field block  EBL:  (See Anesthesia Intraoperative Record) Total I/O In: 500 [I.V.:500] Out: -    Delay start of Pharmacological VTE agent (>24hrs) due to surgical blood loss or risk of bleeding:  no  DRAINS: None   SPECIMEN: Gallbladder    DISPOSITION OF SPECIMEN:  PATHOLOGY  COUNTS:  YES  PLAN OF CARE: Admit for overnight observation  PATIENT DISPOSITION:  PACU - hemodynamically stable.  INDICATION: Pleasant gentleman with chronic issues and pacemaker.  Renal failure with dialysis.  Prior abdominal surgeries.  Biliary colic suspicious for chronic cholecystitis.  Cleared by cardiology and nephrology.  Cholecystectomy offered  The anatomy & physiology of hepatobiliary & pancreatic function was discussed.  The pathophysiology of gallbladder dysfunction was discussed.  Natural history risks without surgery was discussed.   I feel the risks of no intervention will lead to serious problems that outweigh the operative risks; therefore, I recommended cholecystectomy to remove the pathology.  I explained laparoscopic techniques with possible need for an open approach.  Probable cholangiogram to evaluate the bilary tract was explained as well.    Risks such as  bleeding, infection, abscess, leak, injury to other organs, need for further treatment, heart attack, death, and other risks were discussed.  I noted a good likelihood this will help address the problem.  Possibility that this will not correct all abdominal symptoms was explained.  Goals of post-operative recovery were discussed as well.  We will work to minimize complications.  An educational handout further explaining the pathology and treatment options was given as well.  Questions were answered.  The patient expresses understanding & wishes to proceed with surgery.  OR FINDINGS: Very thickened and chronically inflamed gallbladder consistent with chronic cholecystitis with probable acute edema as well.  Liver: Normal  Omental adhesions periumbilical region.  No definite hernia but soft subcutaneous mass suspicious for small periumbilical hernia with omentum.  Left alone.  DESCRIPTION:   Informed consent was confirmed.  The patient underwent general anaesthesia without difficulty.  The patient was positioned appropriately.  VTE prevention in place.  The patient's abdomen was clipped, prepped, & draped in a sterile fashion.  Surgical timeout confirmed our plan.  Peritoneal entry with a laparoscopic port was obtained using Varess technique in the left upper abdomen as the patient was positioned in reverse Trendelenburg.  No elevation in end-tidal CO2.  Carefully placed a port in the right upper quadrant, staying away from periumbilical and infraumbilical incisions from his prior peritoneal dialysis catheters and hernia repairs.  Camera inspection revealed no injury.  Tongue of omentum was coming up periumbilical region but no obvious fascial defect.  It was left alone.  Extra ports were carefully placed under direct laparoscopic visualization.  I turned attention to the right upper quadrant.  Gallbladder was very thickened and edematous with dense mesocolonic and omental  adhesions.  The gallbladder  fundus was elevated cephalad. I used hook cautery to free the peritoneal coverings between the gallbladder and the liver on the posteriolateral and anteriomedial walls.   I used careful blunt and hook dissection to help get a good critical view of the cystic artery and cystic duct.  However peritoneal coverings were quite thickened and edematous and fibrotic.  Nearly concrete.  I decided to transition to a dome down approach to get the gallbladder off the falciform liver.  It was quite intrahepatic.  Decompressed the gallbladder.  Carefully brought the gallbladder down.  Posterior wall was quite thickened fibrotic and adherent to the liver bed.  Did have to open it up more.  Some black hemolytic type gallstones were found & removed separately.  Eventually, I was able to get the gallbladder down to the dome down fashion.  With better mobilization of the gallbladder, I could better dissect around the critical structures.  I got a critical view a tubular structure near an ellipsoid mass consistent with dilated lymphatics.  Possible branch of the cystic artery.  Clips were used and transected off.  Posterior branch was carefully dissected and after getting a good critical view of this freed off as well.    Dissected around the infundibulum and found a good candidate for the cystic duct.  Staying infundibular side.  Carefully dissected & skeletonized cystic duct 2.5 centimeters.  Got a complete 360 critical view.  Placed clips on it and transected off the structure.  Intraluminal bile consistent with the cystic duct.  One remaining tubular structure was carefully skeletonized and clipped and transected consistent with the dominant anterior cystic arterial branch.  Reinforced ligation of the cystic duct stump & anterior arterial branches with 0 PDS Endoloop since tissues were poor & fibrotic.  Gallbladder was placed inside an Endo Catch bag and removed at the subxiphoid port site  Focused on hemostasis on the  gallbladder fossa of the liver.  Friable and oozing.  After focused cautery and serial irrigations, we got good hemostasis.  Did serial irrigations of several liters of saline until I had nice healthy clear colorless return.  No bleeding nor bile on the liver bed.  Clips intact on the structures.  I decided not to leave a drain.  Evacuated carbon dioxide.  I closed the subxiphoid fascia transversely using #1 PDS interrupted stitches. I closed the skin using 4-0 monocryl stitch.  Sterile dressings were applied. The patient was extubated & arrived in the PACU in stable condition..  I had discussed postoperative care with the patient in the holding area. I discussed operative findings, updated the patient's status, discussed probable steps to recovery, and gave postoperative recommendations to the Patient's daughter.  Recommendations were made.  Questions were answered.  She expressed understanding & appreciation.  Dr Jonnie Finner with Nephrology notified & is planning to help manage the patient.  Adin Hector, M.D., F.A.C.S. Gastrointestinal and Minimally Invasive Surgery [facility] [address] [place], [place] [address] [phone] Main / Paging  12/02/2017 9:19 AM

## 2017-12-02 NOTE — Interval H&P Note (Signed)
History and Physical Interval Note:  12/02/2017 7:13 AM  Sean Best  has presented today for surgery, with the diagnosis of Chronic cholecystitis  The various methods of treatment have been discussed with the patient and family. After consideration of risks, benefits and other options for treatment, the patient has consented to  Procedure(s): [place] (N/A) as a surgical intervention .  The patient's history has been reviewed, patient examined, no change in status, stable for surgery.  I have reviewed the patient's chart and labs.  Questions were answered to the patient's satisfaction.    I have re-reviewed the the patient's records, history, medications, and allergies.  I have re-examined the patient.  I again discussed intraoperative plans and goals of post-operative recovery.  The patient agrees to proceed.  Sean Best  November 02, 1933 [id number]  Patient Care Team: Seward Carol, MD as PCP - General (Internal Medicine) Evans Lance, MD as PCP - Electrophysiology (Cardiology) Evans Lance, MD as PCP - Cardiology (Cardiology) Clent Jacks, MD (Ophthalmology) Elmarie Shiley, MD as Consulting Physician (Nephrology) Michael Boston, MD as Consulting Physician (General Surgery)  Patient Active Problem List   Diagnosis Date Noted  . Acute hypoxemic respiratory failure ([place]) 04/18/2017  . Heart block 04/17/2017  . Fluid overload 04/17/2017  . Elevated troponin 04/17/2017  . ESRD on hemodialysis ([place]) 04/12/2016  . H/O: stroke 04/11/2016  . Saccular aneurysm of infrarenal aorta with thrombosed dissection of abdominal aorta 04/11/2016  . Obesity (BMI 30-39.9) 04/11/2016  . Syncope 04/11/2016  . Prostate CA ([place]) 12/28/2014  . Chest pain 12/28/2014  . Anemia of chronic disease 03/11/2013  . DM (diabetes mellitus), type 2 with renal complications, diet controlled 03/11/2013  . Benign hypertension 03/11/2013  .  Hyperlipidemia 03/11/2013  . Umbilical hernia 19/62/2297    Past Medical History:  Diagnosis Date  . Anemia   . Arthritis    "knees" (04/11/2016)  . Blind left eye    S/P trauma  . Chronic total occlusion of artery of the extremities (HCC)    pt not aware of this  . Claudication ([place])   . ESRD on dialysis [facility])    "MWF; [name]" ((04/10/2016)  . ESRD on peritoneal dialysis [facility])    Started dialysis around April 2015 per son.  Has been doing peritoneal dialysis at home.     . Gout   . Hyperlipidemia   . Hypertension   . Overweight(278.02)   . Presence of permanent cardiac pacemaker    medtronic  . Prostate cancer ([place]) 1990s  . Shingles   . Stroke [facility]) ~1998   denies residual on 04/11/2016  . Type II diabetes mellitus ([place])    diet controlled    Past Surgical History:  Procedure Laterality Date  . AV FISTULA PLACEMENT, RADIOCEPHALIC  98/92/1194   Left arm  . CAPD INSERTION N/A 03/06/2013   Procedure: LAPAROSCOPIC INSERTION CONTINUOUS AMBULATORY PERITONEAL DIALYSIS  (CAPD) CATHETER;  Surgeon: Edward Jolly, MD;  Location: [place];  Service: General;  Laterality: N/A;  . CATARACT EXTRACTION Right   . EYE SURGERY Left 1970s   for eye injury  . HERNIA REPAIR    . INSERTION PROSTATE RADIATION SEED    . PACEMAKER IMPLANT N/A 04/20/2017   Procedure: PACEMAKER IMPLANT;  Surgeon: Evans Lance, MD;  Location: [place] CV LAB;  Service: Cardiovascular;  Laterality: N/A;  . UMBILICAL HERNIA REPAIR N/A 03/06/2013   Procedure: HERNIA REPAIR UMBILICAL WITH MESH;  Surgeon:  Edward Jolly, MD;  Location: [facility] OR;  Service: General;  Laterality: N/A;    Social History   Socioeconomic History  . Marital status: Widowed    Spouse name: Not on file  . Number of children: Not on file  . Years of education: Not on file  . Highest education level: Not on file  Occupational History  . Not on file  Social Needs  . Financial resource strain: Not on file  . Food insecurity:     Worry: Not on file    Inability: Not on file  . Transportation needs:    Medical: Not on file    Non-medical: Not on file  Tobacco Use  . Smoking status: Never Smoker  . Smokeless tobacco: Never Used  Substance and Sexual Activity  . Alcohol use: No  . Drug use: No  . Sexual activity: Never  Lifestyle  . Physical activity:    Days per week: Not on file    Minutes per session: Not on file  . Stress: Not on file  Relationships  . Social connections:    Talks on phone: Not on file    Gets together: Not on file    Attends religious service: Not on file    Active member of club or organization: Not on file    Attends meetings of clubs or organizations: Not on file    Relationship status: Not on file  . Intimate partner violence:    Fear of current or ex partner: Not on file    Emotionally abused: Not on file    Physically abused: Not on file    Forced sexual activity: Not on file  Other Topics Concern  . Not on file  Social History Narrative  . Not on file    Family History  Problem Relation Age of Onset  . Cancer Mother   . Heart disease Mother   . Cancer Father     Medications Prior to Admission  Medication Sig Dispense Refill Last Dose  . allopurinol (ZYLOPRIM) 100 MG tablet Take 100 mg by mouth daily.     12/01/2017 at Unknown time  . amLODipine (NORVASC) 10 MG tablet Take 10 mg by mouth at bedtime.   9 12/01/2017 at Unknown time  . atorvastatin (LIPITOR) 40 MG tablet Take 40 mg by mouth daily.  3 12/01/2017 at Unknown time  . calcium acetate (PHOSLO) 667 MG capsule Take 1,334-2,001 mg by mouth See admin instructions. Take 2001 mg by mouth 3 times daily with meals and take 1344 mg by mouth with snacks   Past Week at Unknown time  . calcium carbonate (TUMS EX) 750 MG chewable tablet Chew 4 tablets by mouth 2 (two) times daily.   12/01/2017 at Unknown time  . Cholecalciferol (VITAMIN D-3) 1000 units CAPS Take 1,000 Units by mouth daily.   Past Month at Unknown time  .  gabapentin (NEURONTIN) 100 MG capsule Take 100 mg by mouth at bedtime.  10 12/01/2017 at Unknown time  . ibuprofen (ADVIL,MOTRIN) 200 MG tablet Take 400 mg by mouth every 6 (six) hours as needed for headache or moderate pain.   Past Week at Unknown time  . lidocaine-prilocaine (EMLA) cream Apply 1 application topically as needed (for port access).   11 12/01/2017 at Unknown time  . lisinopril (PRINIVIL,ZESTRIL) 10 MG tablet Take 10 mg by mouth at bedtime.  3 Past Month at Unknown time  . loperamide (IMODIUM A-D) 2 MG tablet Take 4 mg by mouth  4 (four) times daily as needed for diarrhea or loose stools.   Past Week at Unknown time  . multivitamin (RENA-VIT) TABS tablet Take 1 tablet by mouth daily.   12/01/2017 at Unknown time  . sevelamer carbonate (RENVELA) 2.4 g PACK Take 7.2 g by mouth 3 (three) times daily with meals.   3 Past Week at Unknown time    Current Facility-Administered Medications  Medication Dose Route Frequency Provider Last Rate Last Dose  . bupivacaine liposome (EXPAREL) 1.3 % injection 266 mg  20 mL Infiltration Once Michael Boston, MD      . ceFAZolin (ANCEF) IVPB 2g/100 mL premix  2 g Intravenous On Call to OR Michael Boston, MD       And  . metroNIDAZOLE (FLAGYL) IVPB 500 mg  500 mg Intravenous To Debe Coder, MD      . Chlorhexidine Gluconate Cloth 2 % PADS 6 each  6 each Topical Once Michael Boston, MD       And  . Chlorhexidine Gluconate Cloth 2 % PADS 6 each  6 each Topical Once Michael Boston, MD         Allergies  Allergen Reactions  . Cardura [Doxazosin Mesylate] Other (See Comments)    Hallucinations    BP (!) 173/99   Pulse 95   Temp 98.3 F (36.8 C) (Oral)   Resp 20   Ht 5\' 5"  (1.651 m)   Wt 89.4 kg   SpO2 97%   BMI 32.78 kg/m   Labs: Results for orders placed or performed during the hospital encounter of 12/02/17 (from the past 48 hour(s))  Glucose, capillary     Status: None   Collection Time: 12/02/17  6:02 AM  Result Value Ref Range    Glucose-Capillary 93 70 - 99 mg/dL   Comment 1 Notify RN    Comment 2 Document in Chart   I-STAT 4, (NA,K, GLUC, HGB,HCT)     Status: Abnormal   Collection Time: 12/02/17  6:16 AM  Result Value Ref Range   Sodium 140 135 - 145 mmol/L   Potassium 4.1 3.5 - 5.1 mmol/L   Glucose, Bld 94 70 - 99 mg/dL   HCT 31.0 (L) 39.0 - 52.0 %   Hemoglobin 10.5 (L) 13.0 - 17.0 g/dL    Imaging / Studies: No results found.   Adin Hector, M.D., F.A.C.S. Gastrointestinal and Minimally Invasive Surgery [facility] [address], [place], [place] [address] [phone] Main / Paging  12/02/2017 7:14 AM    Adin Hector

## 2017-12-02 NOTE — Anesthesia Postprocedure Evaluation (Signed)
Anesthesia Post Note  Patient: Sean Best  Procedure(s) Performed: LAPAROSCOPIC CHOLECYSTECTOMY (N/A Abdomen)     Patient location during evaluation: PACU Anesthesia Type: General Level of consciousness: awake and alert Pain management: pain level controlled Vital Signs Assessment: post-procedure vital signs reviewed and stable Respiratory status: spontaneous breathing, nonlabored ventilation, respiratory function stable and patient connected to nasal cannula oxygen Cardiovascular status: blood pressure returned to baseline and stable Postop Assessment: no apparent nausea or vomiting Anesthetic complications: no    Last Vitals:  Vitals:   12/02/17 1000 12/02/17 1030  BP: 113/67 115/71  Pulse: 78 77  Resp: 15 14  Temp:    SpO2: 98% 97%    Last Pain:  Vitals:   12/02/17 1030  TempSrc:   PainSc: 0-No pain                 Estella Malatesta S

## 2017-12-02 NOTE — Anesthesia Preprocedure Evaluation (Signed)
Anesthesia Evaluation  Patient identified by MRN, date of birth, ID band Patient awake    Reviewed: Allergy & Precautions, H&P , NPO status , Patient's Chart, lab work & pertinent test results  Airway Mallampati: II   Neck ROM: full    Dental   Pulmonary neg pulmonary ROS,    breath sounds clear to auscultation       Cardiovascular hypertension, + dysrhythmias + pacemaker  Rhythm:regular Rate:Normal     Neuro/Psych CVA, No Residual Symptoms    GI/Hepatic   Endo/Other  diabetes, Type 2  Renal/GU ESRF and DialysisRenal disease     Musculoskeletal   Abdominal   Peds  Hematology  (+) anemia ,   Anesthesia Other Findings   Reproductive/Obstetrics                             Anesthesia Physical Anesthesia Plan  ASA: III  Anesthesia Plan: General   Post-op Pain Management:    Induction: Intravenous  PONV Risk Score and Plan: 2 and Ondansetron, Dexamethasone and Treatment may vary due to age or medical condition  Airway Management Planned: Oral ETT  Additional Equipment:   Intra-op Plan:   Post-operative Plan: Extubation in OR  Informed Consent: I have reviewed the patients History and Physical, chart, labs and discussed the procedure including the risks, benefits and alternatives for the proposed anesthesia with the patient or authorized representative who has indicated his/her understanding and acceptance.     Plan Discussed with: CRNA, Anesthesiologist and Surgeon  Anesthesia Plan Comments:         Anesthesia Quick Evaluation

## 2017-12-03 ENCOUNTER — Encounter (HOSPITAL_COMMUNITY): Payer: Self-pay | Admitting: Surgery

## 2017-12-03 DIAGNOSIS — K8012 Calculus of gallbladder with acute and chronic cholecystitis without obstruction: Secondary | ICD-10-CM | POA: Diagnosis not present

## 2017-12-03 LAB — RENAL FUNCTION PANEL
Albumin: 3 g/dL — ABNORMAL LOW (ref 3.5–5.0)
Anion gap: 15 (ref 5–15)
BUN: 43 mg/dL — ABNORMAL HIGH (ref 8–23)
CHLORIDE: 97 mmol/L — AB (ref 98–111)
CO2: 27 mmol/L (ref 22–32)
CREATININE: 9.94 mg/dL — AB (ref 0.61–1.24)
Calcium: 9.5 mg/dL (ref 8.9–10.3)
GFR calc non Af Amer: 4 mL/min — ABNORMAL LOW (ref >60)
GFR, EST AFRICAN AMERICAN: 5 mL/min — AB (ref >60)
Glucose, Bld: 110 mg/dL — ABNORMAL HIGH (ref 70–99)
Phosphorus: 9.1 mg/dL — ABNORMAL HIGH (ref 2.5–4.6)
Potassium: 4.6 mmol/L (ref 3.5–5.1)
Sodium: 139 mmol/L (ref 135–145)

## 2017-12-03 LAB — CBC
HCT: 28.4 % — ABNORMAL LOW (ref 39.0–52.0)
Hemoglobin: 8.7 g/dL — ABNORMAL LOW (ref 13.0–17.0)
MCH: 28.6 pg (ref 26.0–34.0)
MCHC: 30.6 g/dL (ref 30.0–36.0)
MCV: 93.4 fL (ref 78.0–100.0)
PLATELETS: 264 10*3/uL (ref 150–400)
RBC: 3.04 MIL/uL — AB (ref 4.22–5.81)
RDW: 17.3 % — ABNORMAL HIGH (ref 11.5–15.5)
WBC: 9.6 10*3/uL (ref 4.0–10.5)

## 2017-12-03 MED ORDER — PENTAFLUOROPROP-TETRAFLUOROETH EX AERO: 1.0000 "application " | INHALATION_SPRAY | CUTANEOUS | Status: DC | PRN

## 2017-12-03 MED ORDER — SODIUM CHLORIDE 0.9 % IV SOLN: 100.0000 mL | INTRAVENOUS | Status: DC | PRN

## 2017-12-03 MED ORDER — LIDOCAINE-PRILOCAINE 2.5-2.5 % EX CREA: 1.0000 "application " | TOPICAL_CREAM | CUTANEOUS | Status: DC | PRN

## 2017-12-03 NOTE — Discharge Summary (Signed)
Physician Discharge Summary    Patient ID: Sean Best MRN: [id number] DOB/AGE: 1933-11-11  84 y.o.  Admit date: 12/02/2017 Discharge date: 12/03/2017   Hospital Stay = 0 days  Patient Care Team: Seward Carol, MD as PCP - General (Internal Medicine) Evans Lance, MD as PCP - Electrophysiology (Cardiology) Evans Lance, MD as PCP - Cardiology (Cardiology) Clent Jacks, MD (Ophthalmology) Elmarie Shiley, MD as Consulting Physician (Nephrology) Michael Boston, MD as Consulting Physician (General Surgery)  Discharge Diagnoses:  Principal Problem:   Acute on chronic cholecystitis s/p lap cholecystectomy 12/02/2017 Active Problems:   ESRD on hemodialysis [facility])   Cardiac pacemaker in situ (Medtronic DDD)   Anemia of chronic disease   DM (diabetes mellitus), type 2 with renal complications, diet controlled   Benign hypertension   Hyperlipidemia   H/O: stroke   Obesity (BMI 30-39.9)   1 Day Post-Op  12/02/2017  POST-OPERATIVE DIAGNOSIS:   Chronic cholecystitis  SURGERY:  12/02/2017  Procedure(s): LAPAROSCOPIC CHOLECYSTECTOMY  SURGEON:    Surgeon(s): Michael Boston, MD  Consults: nephrology  Hospital Course:   Pleasant patient with cardiac disease and pacemaker as well as dialysis dependent end-stage renal disease.  Segmental gallbladder.  Underwent lap scopic cholecystectomy for more acute on chronic cholecystitis.  The patient underwent the surgery above.  Postoperatively, the patient gradually mobilized and advanced to a solid diet.  Pain and other symptoms were treated aggressively.    By the time of discharge, the patient was walking well the hallways, eating food, having flatus.  He was seen by nephrology and underwent dialysis without incident.  It was felt by surgery and nephrology that it would be safe for him be discharged home after dialysis.  Pain was well-controlled on an oral medications.  Based on meeting discharge criteria and continuing to recover, I  felt it was safe for the patient to be discharged from the hospital to further recover with close followup. Postoperative recommendations were discussed in detail.  They are written as well.  Discharged Condition: good  Disposition:  Follow-up Information    Michael Boston, MD. Schedule an appointment as soon as possible for a visit in 3 weeks.   Specialty:  General Surgery Why:  To follow up after your operation Contact information: [address] [place] [address] [phone]           Discharge disposition: 01-Home or Self Care       Discharge Instructions    Call MD for:   Complete by:  As directed    FEVER > 101.5 F  (temperatures < 101.5 F are not significant)   Call MD for:   Complete by:  As directed    FEVER > 101.5 F  (temperatures < 101.5 F are not significant)   Call MD for:  extreme fatigue   Complete by:  As directed    Call MD for:  extreme fatigue   Complete by:  As directed    Call MD for:  persistant dizziness or light-headedness   Complete by:  As directed    Call MD for:  persistant dizziness or light-headedness   Complete by:  As directed    Call MD for:  persistant nausea and vomiting   Complete by:  As directed    Call MD for:  persistant nausea and vomiting   Complete by:  As directed    Call MD for:  redness, tenderness, or signs of infection (pain, swelling, redness, odor or green/yellow discharge around  incision site)   Complete by:  As directed    Call MD for:  redness, tenderness, or signs of infection (pain, swelling, redness, odor or green/yellow discharge around incision site)   Complete by:  As directed    Call MD for:  severe uncontrolled pain   Complete by:  As directed    Call MD for:  severe uncontrolled pain   Complete by:  As directed    Diet - low sodium heart healthy   Complete by:  As directed    Start with a bland diet such as soups, liquids, starchy foods, low fat foods, etc. the first few days at  home. Gradually advance to a solid, low-fat, high fiber diet by the end of the first week at home.   Add a fiber supplement to your diet (Metamucil, etc) If you feel full, bloated, or constipated, stay on a full liquid or pureed/blenderized diet for a few days until you feel better and are no longer constipated.   Diet - low sodium heart healthy   Complete by:  As directed    Start with a bland diet such as soups, liquids, starchy foods, low fat foods, etc. the first few days at home. Gradually advance to a solid, low-fat, high fiber diet by the end of the first week at home.   Add a fiber supplement to your diet (Metamucil, etc) If you feel full, bloated, or constipated, stay on a full liquid or pureed/blenderized diet for a few days until you feel better and are no longer constipated.   Discharge instructions   Complete by:  As directed    See Discharge Instructions If you are not getting better after two weeks or are noticing you are getting worse, contact our office [phone] for further advice.  We may need to adjust your medications, re-evaluate you in the office, send you to the emergency room, or see what other things we can do to help. The clinic staff is available to answer your questions during regular business hours (8:30am-5pm).  Please don't hesitate to call and ask to speak to one of our nurses for clinical concerns.    A surgeon from [facility] is always on call at the hospitals 24 hours/day If you have a medical emergency, go to the nearest emergency room or call 911.   Discharge instructions   Complete by:  As directed    See Discharge Instructions If you are not getting better after two weeks or are noticing you are getting worse, contact our office [phone] for further advice.  We may need to adjust your medications, re-evaluate you in the office, send you to the emergency room, or see what other things we can do to help. The clinic staff is available  to answer your questions during regular business hours (8:30am-5pm).  Please don't hesitate to call and ask to speak to one of our nurses for clinical concerns.    A surgeon from [facility] is always on call at the hospitals 24 hours/day If you have a medical emergency, go to the nearest emergency room or call 911.   Discharge wound care:   Complete by:  As directed    It is good for closed incisions and even open wounds to be washed every day.  Shower every day.  Short baths are fine.  Wash the incisions and wounds clean with soap & water.     You may leave closed incisions open to air  if it is dry.   You may cover the incision with clean gauze & replace it after your daily shower for comfort.  Remove skin dressings 5 days after surgery = 10/8   Discharge wound care:   Complete by:  As directed    It is good for closed incisions and even open wounds to be washed every day.  Shower every day.  Short baths are fine.  Wash the incisions and wounds clean with soap & water.     You may leave closed incisions open to air if it is dry.   You may cover the incision with clean gauze & replace it after your daily shower for comfort.   If you have skin tapes (Steristrips) or skin glue (Dermabond) on your incision, leave them in place.  They will fall off on their own like a scab.  You may trim any edges that curl up with clean scissors.    If you have skin staples, set up an appointment for them to be removed in the office in 10 days after surgery.  If you have a drain, wash around the skin exit site with soap & water and place a new dressing of gauze or band aid around the skin every day.  Keep the drain site clean & dry.   Driving Restrictions   Complete by:  As directed    You may drive when: - you are no longer taking narcotic prescription pain medication - you can comfortably wear a seatbelt - you can safely make sudden turns/stops without pain.   Driving Restrictions   Complete  by:  As directed    You may drive when: - you are no longer taking narcotic prescription pain medication - you can comfortably wear a seatbelt - you can safely make sudden turns/stops without pain.   Increase activity slowly   Complete by:  As directed    Start light daily activities --- self-care, walking, climbing stairs- beginning the day after surgery.  Gradually increase activities as tolerated.  Control your pain to be active.  Stop when you are tired.  Ideally, walk several times a day, eventually an hour a day.   Most people are back to most day-to-day activities in a few weeks.  It takes 4-6 weeks to get back to unrestricted, intense activity. If you can walk 30 minutes without difficulty, it is safe to try more intense activity such as jogging, treadmill, bicycling, low-impact aerobics, swimming, etc. Save the most intensive and strenuous activity for last (Usually 4-8 weeks after surgery) such as sit-ups, heavy lifting, contact sports, etc.  Refrain from any intense heavy lifting or straining until you are off narcotics for pain control.  You will have off days, but things should improve week-by-week. DO NOT PUSH THROUGH PAIN.  Let pain be your guide: If it hurts to do something, don't do it.   Increase activity slowly   Complete by:  As directed    Start light daily activities --- self-care, walking, climbing stairs- beginning the day after surgery.  Gradually increase activities as tolerated.  Control your pain to be active.  Stop when you are tired.  Ideally, walk several times a day, eventually an hour a day.   Most people are back to most day-to-day activities in a few weeks.  It takes 4-6 weeks to get back to unrestricted, intense activity. If you can walk 30 minutes without difficulty, it is safe to try more intense activity such as jogging, treadmill,  bicycling, low-impact aerobics, swimming, etc. Save the most intensive and strenuous activity for last (Usually 4-8 weeks after  surgery) such as sit-ups, heavy lifting, contact sports, etc.  Refrain from any intense heavy lifting or straining until you are off narcotics for pain control.  You will have off days, but things should improve week-by-week. DO NOT PUSH THROUGH PAIN.  Let pain be your guide: If it hurts to do something, don't do it.   Lifting restrictions   Complete by:  As directed    If you can walk 30 minutes without difficulty, it is safe to try more intense activity such as jogging, treadmill, bicycling, low-impact aerobics, swimming, etc. Save the most intensive and strenuous activity for last (Usually 4-8 weeks after surgery) such as sit-ups, heavy lifting, contact sports, etc.   Refrain from any intense heavy lifting or straining until you are off narcotics for pain control.  You will have off days, but things should improve week-by-week. DO NOT PUSH THROUGH PAIN.  Let pain be your guide: If it hurts to do something, don't do it.  Pain is your body warning you to avoid that activity for another week until the pain goes down.   Lifting restrictions   Complete by:  As directed    If you can walk 30 minutes without difficulty, it is safe to try more intense activity such as jogging, treadmill, bicycling, low-impact aerobics, swimming, etc. Save the most intensive and strenuous activity for last (Usually 4-8 weeks after surgery) such as sit-ups, heavy lifting, contact sports, etc.   Refrain from any intense heavy lifting or straining until you are off narcotics for pain control.  You will have off days, but things should improve week-by-week. DO NOT PUSH THROUGH PAIN.  Let pain be your guide: If it hurts to do something, don't do it.  Pain is your body warning you to avoid that activity for another week until the pain goes down.   May shower / Bathe   Complete by:  As directed    May shower / Bathe   Complete by:  As directed    May walk up steps   Complete by:  As directed    May walk up steps   Complete  by:  As directed    Sexual Activity Restrictions   Complete by:  As directed    You may have sexual intercourse when it is comfortable. If it hurts to do something, stop.   Sexual Activity Restrictions   Complete by:  As directed    You may have sexual intercourse when it is comfortable. If it hurts to do something, stop.      Allergies as of 12/03/2017      Reactions   Cardura [doxazosin Mesylate] Other (See Comments)   Hallucinations      Medication List    TAKE these medications   allopurinol 100 MG tablet Commonly known as:  ZYLOPRIM Take 100 mg by mouth daily.   amLODipine 10 MG tablet Commonly known as:  NORVASC Take 10 mg by mouth at bedtime.   atorvastatin 40 MG tablet Commonly known as:  LIPITOR Take 40 mg by mouth daily.   calcium acetate 667 MG capsule Commonly known as:  PHOSLO Take 1,334-2,001 mg by mouth See admin instructions. Take 2001 mg by mouth 3 times daily with meals and take 1344 mg by mouth with snacks   calcium carbonate 750 MG chewable tablet Commonly known as:  TUMS EX Chew 4 tablets by  mouth 2 (two) times daily.   gabapentin 100 MG capsule Commonly known as:  NEURONTIN Take 100 mg by mouth at bedtime.   ibuprofen 200 MG tablet Commonly known as:  ADVIL,MOTRIN Take 400 mg by mouth every 6 (six) hours as needed for headache or moderate pain.   lidocaine-prilocaine cream Commonly known as:  EMLA Apply 1 application topically as needed (for port access).   lisinopril 10 MG tablet Commonly known as:  PRINIVIL,ZESTRIL Take 10 mg by mouth at bedtime.   loperamide 2 MG tablet Commonly known as:  IMODIUM A-D Take 4 mg by mouth 4 (four) times daily as needed for diarrhea or loose stools.   multivitamin Tabs tablet Take 1 tablet by mouth daily.   sevelamer carbonate 2.4 g Pack Commonly known as:  RENVELA Take 7.2 g by mouth 3 (three) times daily with meals.   traMADol 50 MG tablet Commonly known as:  ULTRAM Take 1 tablet (50 mg  total) by mouth every 6 (six) hours as needed for moderate pain or severe pain.   Vitamin D-3 1000 units Caps Take 1,000 Units by mouth daily.            Discharge Care Instructions  (From admission, onward)         Start     Ordered   12/03/17 0000  Discharge wound care:    Comments:  It is good for closed incisions and even open wounds to be washed every day.  Shower every day.  Short baths are fine.  Wash the incisions and wounds clean with soap & water.     You may leave closed incisions open to air if it is dry.   You may cover the incision with clean gauze & replace it after your daily shower for comfort.   If you have skin tapes (Steristrips) or skin glue (Dermabond) on your incision, leave them in place.  They will fall off on their own like a scab.  You may trim any edges that curl up with clean scissors.    If you have skin staples, set up an appointment for them to be removed in the office in 10 days after surgery.  If you have a drain, wash around the skin exit site with soap & water and place a new dressing of gauze or band aid around the skin every day.  Keep the drain site clean & dry.   12/03/17 1537   12/02/17 0000  Discharge wound care:    Comments:  It is good for closed incisions and even open wounds to be washed every day.  Shower every day.  Short baths are fine.  Wash the incisions and wounds clean with soap & water.     You may leave closed incisions open to air if it is dry.   You may cover the incision with clean gauze & replace it after your daily shower for comfort.  Remove skin dressings 5 days after surgery = 10/8   12/02/17 0735          Significant Diagnostic Studies:  Results for orders placed or performed during the hospital encounter of 12/02/17 (from the past 72 hour(s))  Glucose, capillary     Status: None   Collection Time: 12/02/17  6:02 AM  Result Value Ref Range   Glucose-Capillary 93 70 - 99 mg/dL   Comment 1 Notify RN    Comment  2 Document in Chart   I-STAT 4, (NA,K, GLUC, HGB,HCT)  Status: Abnormal   Collection Time: 12/02/17  6:16 AM  Result Value Ref Range   Sodium 140 135 - 145 mmol/L   Potassium 4.1 3.5 - 5.1 mmol/L   Glucose, Bld 94 70 - 99 mg/dL   HCT 31.0 (L) 39.0 - 52.0 %   Hemoglobin 10.5 (L) 13.0 - 17.0 g/dL  Glucose, capillary     Status: Abnormal   Collection Time: 12/02/17  9:29 AM  Result Value Ref Range   Glucose-Capillary 120 (H) 70 - 99 mg/dL   Comment 1 Notify RN    Comment 2 Document in Chart   CBC     Status: Abnormal   Collection Time: 12/03/17  8:43 AM  Result Value Ref Range   WBC 9.6 4.0 - 10.5 K/uL   RBC 3.04 (L) 4.22 - 5.81 MIL/uL   Hemoglobin 8.7 (L) 13.0 - 17.0 g/dL   HCT 28.4 (L) 39.0 - 52.0 %   MCV 93.4 78.0 - 100.0 fL   MCH 28.6 26.0 - 34.0 pg   MCHC 30.6 30.0 - 36.0 g/dL   RDW 17.3 (H) 11.5 - 15.5 %   Platelets 264 150 - 400 K/uL    Comment: Performed at [facility], [address], [place], [place] [address]  Renal function panel     Status: Abnormal   Collection Time: 12/03/17  8:43 AM  Result Value Ref Range   Sodium 139 135 - 145 mmol/L   Potassium 4.6 3.5 - 5.1 mmol/L   Chloride 97 (L) 98 - 111 mmol/L   CO2 27 22 - 32 mmol/L   Glucose, Bld 110 (H) 70 - 99 mg/dL   BUN 43 (H) 8 - 23 mg/dL   Creatinine, Ser 9.94 (H) 0.61 - 1.24 mg/dL   Calcium 9.5 8.9 - 10.3 mg/dL   Phosphorus 9.1 (H) 2.5 - 4.6 mg/dL   Albumin 3.0 (L) 3.5 - 5.0 g/dL   GFR calc non Af Amer 4 (L) >60 mL/min   GFR calc Af Amer 5 (L) >60 mL/min    Comment: (NOTE) The eGFR has been calculated using the CKD EPI equation. This calculation has not been validated in all clinical situations. eGFR's persistently <60 mL/min signify possible Chronic Kidney Disease.    Anion gap 15 5 - 15    Comment: Performed at [facility] [address] [place], [place] [address]    No results found.  Discharge Exam: Blood pressure 139/75, pulse 82, temperature 97.9 F (36.6 C), temperature  source Oral, resp. rate 18, height '5\' 5"'  (1.651 m), weight 91.5 kg, SpO2 95 %.  General: Pt awake/alert/oriented x4 in No acute distress Eyes: PERRL, normal EOM.  Sclera clear.  No icterus Neuro: CN II-XII intact w/o focal sensory/motor deficits. Lymph: No head/neck/groin lymphadenopathy Psych:  No delerium/psychosis/paranoia HENT: Normocephalic, Mucus membranes moist.  No thrush Neck: Supple, No tracheal deviation Chest: No chest wall pain w good excursion CV:  Pulses intact.  Regular rhythm MS: Normal AROM mjr joints.  No obvious deformity Abdomen: Soft.  Nondistended.  Nontender.  Dressings clean.  No evidence of peritonitis.  No incarcerated hernias. Ext:  SCDs BLE.  No mjr edema.  No cyanosis.  Left upper extremity AV fistula with palpable thrill Skin: No petechiae / purpura  Past Medical History:  Diagnosis Date  . Anemia   . Arthritis    "knees" (04/11/2016)  . Blind left eye    S/P trauma  . Chronic total occlusion of artery of the  extremities ([place])    pt not aware of this  . Claudication ([place])   . ESRD on dialysis [facility])    "MWF; [name]" ((04/10/2016)  . ESRD on peritoneal dialysis [facility])    Started dialysis around April 2015 per son.  Has been doing peritoneal dialysis at home.     . Gout   . Hyperlipidemia   . Hypertension   . Overweight(278.02)   . Presence of permanent cardiac pacemaker    medtronic  . Prostate cancer ([place]) 1990s  . Shingles   . Stroke [facility]) ~1998   denies residual on 04/11/2016  . Type II diabetes mellitus ([facility])    diet controlled  . Umbilical hernia 6/96/7893    Past Surgical History:  Procedure Laterality Date  . AV FISTULA PLACEMENT, RADIOCEPHALIC  81/03/7508   Left arm  . CAPD INSERTION N/A 03/06/2013   Procedure: LAPAROSCOPIC INSERTION CONTINUOUS AMBULATORY PERITONEAL DIALYSIS  (CAPD) CATHETER;  Surgeon: Edward Jolly, MD;  Location: [facility];  Service: General;  Laterality: N/A;  . CATARACT EXTRACTION Right   . CHOLECYSTECTOMY   12/02/2017   LAPROSCOPIC  . EYE SURGERY Left 1970s   for eye injury  . HERNIA REPAIR    . INSERTION PROSTATE RADIATION SEED    . LAPAROSCOPIC CHOLECYSTECTOMY SINGLE SITE WITH INTRAOPERATIVE CHOLANGIOGRAM N/A 12/02/2017   Procedure: LAPAROSCOPIC CHOLECYSTECTOMY;  Surgeon: Michael Boston, MD;  Location: [place];  Service: General;  Laterality: N/A;  . PACEMAKER IMPLANT N/A 04/20/2017   Procedure: PACEMAKER IMPLANT;  Surgeon: Evans Lance, MD;  Location: [place] CV LAB;  Service: Cardiovascular;  Laterality: N/A;  . UMBILICAL HERNIA REPAIR N/A 03/06/2013   Procedure: HERNIA REPAIR UMBILICAL WITH MESH;  Surgeon: Edward Jolly, MD;  Location: [facility] OR;  Service: General;  Laterality: N/A;    Social History   Socioeconomic History  . Marital status: Widowed    Spouse name: Not on file  . Number of children: Not on file  . Years of education: Not on file  . Highest education level: Not on file  Occupational History  . Not on file  Social Needs  . Financial resource strain: Not on file  . Food insecurity:    Worry: Not on file    Inability: Not on file  . Transportation needs:    Medical: Not on file    Non-medical: Not on file  Tobacco Use  . Smoking status: Never Smoker  . Smokeless tobacco: Never Used  Substance and Sexual Activity  . Alcohol use: No  . Drug use: No  . Sexual activity: Not Currently  Lifestyle  . Physical activity:    Days per week: Not on file    Minutes per session: Not on file  . Stress: Not on file  Relationships  . Social connections:    Talks on phone: Not on file    Gets together: Not on file    Attends religious service: Not on file    Active member of club or organization: Not on file    Attends meetings of clubs or organizations: Not on file    Relationship status: Not on file  . Intimate partner violence:    Fear of current or ex partner: Not on file    Emotionally abused: Not on file    Physically abused: Not on file    Forced sexual  activity: Not on file  Other Topics Concern  . Not on file  Social History Narrative  . Not on file  Family History  Problem Relation Age of Onset  . Cancer Mother   . Heart disease Mother   . Cancer Father     Current Facility-Administered Medications  Medication Dose Route Frequency Provider Last Rate Last Dose  . acetaminophen (TYLENOL) tablet 1,000 mg  1,000 mg Oral Tor Netters, MD   1,000 mg at 12/03/17 1500  . allopurinol (ZYLOPRIM) tablet 100 mg  100 mg Oral Daily Michael Boston, MD   100 mg at 12/03/17 1500  . amLODipine (NORVASC) tablet 10 mg  10 mg Oral Ardeen Fillers, MD   10 mg at 12/02/17 2322  . atorvastatin (LIPITOR) tablet 40 mg  40 mg Oral Daily Michael Boston, MD   40 mg at 12/03/17 1500  . bisacodyl (DULCOLAX) suppository 10 mg  10 mg Rectal Daily PRN Michael Boston, MD      . calcium acetate (PHOSLO) capsule 1,334 mg  1,334 mg Oral With snacks Michael Boston, MD   1,334 mg at 12/03/17 1502  . calcium acetate (PHOSLO) capsule 2,001 mg  2,001 mg Oral TID WC Michael Boston, MD   2,001 mg at 12/03/17 1459  . calcium carbonate (TUMS - dosed in mg elemental calcium) chewable tablet   Oral BID Michael Boston, MD   200 mg of elemental calcium at 12/03/17 1459  . Chlorhexidine Gluconate Cloth 2 % PADS 6 each  6 each Topical Q0600 Roney Jaffe, MD   6 each at 12/03/17 0548  . cholecalciferol (VITAMIN D) tablet 1,000 Units  1,000 Units Oral Daily Michael Boston, MD   1,000 Units at 12/03/17 1459  . diphenhydrAMINE (BENADRYL) 12.5 MG/5ML elixir 12.5 mg  12.5 mg Oral Q6H PRN Michael Boston, MD       Or  . diphenhydrAMINE (BENADRYL) injection 12.5 mg  12.5 mg Intravenous Q6H PRN Michael Boston, MD      . enoxaparin (LOVENOX) injection 30 mg  30 mg Subcutaneous Q24H Michael Boston, MD   30 mg at 12/03/17 6226  . gabapentin (NEURONTIN) capsule 300 mg  300 mg Oral BID Michael Boston, MD   300 mg at 12/03/17 1502  . hydrALAZINE (APRESOLINE) injection 5-20 mg  5-20 mg Intravenous  Q4H PRN Michael Boston, MD      . HYDROmorphone (DILAUDID) injection 0.5-2 mg  0.5-2 mg Intravenous Q2H PRN Michael Boston, MD      . ibuprofen (ADVIL,MOTRIN) tablet 400 mg  400 mg Oral Q6H PRN Michael Boston, MD      . lactated ringers infusion 1,000 mL  1,000 mL Intravenous Q8H PRN Kentley Blyden, Remo Lipps, MD      . lidocaine-prilocaine (EMLA) cream 1 application  1 application Topical PRN Michael Boston, MD      . lisinopril (PRINIVIL,ZESTRIL) tablet 10 mg  10 mg Oral Ardeen Fillers, MD   10 mg at 12/02/17 2322  . loperamide (IMODIUM) capsule 4 mg  4 mg Oral QID PRN Michael Boston, MD      . methocarbamol (ROBAXIN) tablet 750 mg  750 mg Oral Q6H PRN Michael Boston, MD      . metoprolol tartrate (LOPRESSOR) injection 5 mg  5 mg Intravenous Q6H PRN Michael Boston, MD      . multivitamin (RENA-VIT) tablet 1 tablet  1 tablet Oral Daily Michael Boston, MD   1 tablet at 12/03/17 1500  . ondansetron (ZOFRAN-ODT) disintegrating tablet 4 mg  4 mg Oral Q6H PRN Michael Boston, MD       Or  . ondansetron [facility]) injection 4 mg  4 mg Intravenous Q6H PRN Michael Boston, MD      . polyethylene glycol (MIRALAX / GLYCOLAX) packet 17 g  17 g Oral Daily PRN Michael Boston, MD      . prochlorperazine (COMPAZINE) tablet 10 mg  10 mg Oral Q6H PRN Michael Boston, MD       Or  . prochlorperazine (COMPAZINE) injection 5-10 mg  5-10 mg Intravenous Q6H PRN Michael Boston, MD      . sevelamer carbonate (RENVELA) powder PACK 7.2 g  7.2 g Oral TID WC Michael Boston, MD   7.2 g at 12/03/17 1459  . simethicone (MYLICON) chewable tablet 40 mg  40 mg Oral Q6H PRN Michael Boston, MD      . traMADol Veatrice Bourbon) tablet 50-100 mg  50-100 mg Oral Q6H PRN Michael Boston, MD         Allergies  Allergen Reactions  . Cardura [Doxazosin Mesylate] Other (See Comments)    Hallucinations    Signed: Morton Peters, MD, FACS, MASCRS Gastrointestinal and Minimally Invasive Surgery    [address], [place], [place]  [address] [phone] Main / Paging [phone] Fax   12/03/2017, 3:37 PM

## 2017-12-03 NOTE — Progress Notes (Signed)
Pt discharged home with daughter in stable condition after going over discharge teaching with no concerns voiced. AVS and discharge script given before leaving

## 2017-12-03 NOTE — Procedures (Signed)
Pt stable overnight, no c/o, no SOB/ CP.  On HD now.  STable from renal standpoint.   I was present at this dialysis session, have reviewed the session itself and made  appropriate changes Kelly Splinter MD [place] pager [phone]   12/03/2017, 9:40 AM

## 2017-12-06 ENCOUNTER — Emergency Department (HOSPITAL_COMMUNITY): Payer: Medicare Other

## 2017-12-06 ENCOUNTER — Encounter (HOSPITAL_COMMUNITY): Payer: Self-pay

## 2017-12-06 ENCOUNTER — Inpatient Hospital Stay (HOSPITAL_COMMUNITY)
Admission: EM | Admit: 2017-12-06 | Discharge: 2017-12-08 | DRG: 947 | Disposition: A | Discharge status: Home Health | Payer: Medicare Other | Attending: Internal Medicine | Admitting: Internal Medicine

## 2017-12-06 ENCOUNTER — Other Ambulatory Visit: Payer: Self-pay

## 2017-12-06 DIAGNOSIS — Z8619 Personal history of other infectious and parasitic diseases: Secondary | ICD-10-CM | POA: Diagnosis not present

## 2017-12-06 DIAGNOSIS — Z8673 Personal history of transient ischemic attack (TIA), and cerebral infarction without residual deficits: Secondary | ICD-10-CM | POA: Diagnosis not present

## 2017-12-06 DIAGNOSIS — E1122 Type 2 diabetes mellitus with diabetic chronic kidney disease: Secondary | ICD-10-CM | POA: Diagnosis present

## 2017-12-06 DIAGNOSIS — Z9049 Acquired absence of other specified parts of digestive tract: Secondary | ICD-10-CM

## 2017-12-06 DIAGNOSIS — R1084 Generalized abdominal pain: Secondary | ICD-10-CM | POA: Diagnosis present

## 2017-12-06 DIAGNOSIS — Z809 Family history of malignant neoplasm, unspecified: Secondary | ICD-10-CM

## 2017-12-06 DIAGNOSIS — Z992 Dependence on renal dialysis: Secondary | ICD-10-CM | POA: Diagnosis not present

## 2017-12-06 DIAGNOSIS — G8918 Other acute postprocedural pain: Secondary | ICD-10-CM | POA: Diagnosis not present

## 2017-12-06 DIAGNOSIS — I10 Essential (primary) hypertension: Secondary | ICD-10-CM

## 2017-12-06 DIAGNOSIS — Z9841 Cataract extraction status, right eye: Secondary | ICD-10-CM

## 2017-12-06 DIAGNOSIS — M170 Bilateral primary osteoarthritis of knee: Secondary | ICD-10-CM | POA: Diagnosis present

## 2017-12-06 DIAGNOSIS — K5900 Constipation, unspecified: Secondary | ICD-10-CM | POA: Diagnosis present

## 2017-12-06 DIAGNOSIS — H5462 Unqualified visual loss, left eye, normal vision right eye: Secondary | ICD-10-CM | POA: Diagnosis present

## 2017-12-06 DIAGNOSIS — D72829 Elevated white blood cell count, unspecified: Secondary | ICD-10-CM

## 2017-12-06 DIAGNOSIS — I120 Hypertensive chronic kidney disease with stage 5 chronic kidney disease or end stage renal disease: Secondary | ICD-10-CM | POA: Diagnosis present

## 2017-12-06 DIAGNOSIS — G8929 Other chronic pain: Secondary | ICD-10-CM | POA: Diagnosis present

## 2017-12-06 DIAGNOSIS — M109 Gout, unspecified: Secondary | ICD-10-CM

## 2017-12-06 DIAGNOSIS — Z8249 Family history of ischemic heart disease and other diseases of the circulatory system: Secondary | ICD-10-CM

## 2017-12-06 DIAGNOSIS — N186 End stage renal disease: Secondary | ICD-10-CM | POA: Diagnosis not present

## 2017-12-06 DIAGNOSIS — R109 Unspecified abdominal pain: Secondary | ICD-10-CM | POA: Diagnosis present

## 2017-12-06 DIAGNOSIS — Z8546 Personal history of malignant neoplasm of prostate: Secondary | ICD-10-CM

## 2017-12-06 DIAGNOSIS — E875 Hyperkalemia: Secondary | ICD-10-CM | POA: Diagnosis not present

## 2017-12-06 DIAGNOSIS — E1151 Type 2 diabetes mellitus with diabetic peripheral angiopathy without gangrene: Secondary | ICD-10-CM | POA: Diagnosis present

## 2017-12-06 DIAGNOSIS — D649 Anemia, unspecified: Secondary | ICD-10-CM

## 2017-12-06 DIAGNOSIS — I4510 Unspecified right bundle-branch block: Secondary | ICD-10-CM | POA: Diagnosis present

## 2017-12-06 DIAGNOSIS — Z79899 Other long term (current) drug therapy: Secondary | ICD-10-CM

## 2017-12-06 DIAGNOSIS — Z888 Allergy status to other drugs, medicaments and biological substances status: Secondary | ICD-10-CM

## 2017-12-06 DIAGNOSIS — E785 Hyperlipidemia, unspecified: Secondary | ICD-10-CM | POA: Diagnosis not present

## 2017-12-06 DIAGNOSIS — Z950 Presence of cardiac pacemaker: Secondary | ICD-10-CM

## 2017-12-06 DIAGNOSIS — D631 Anemia in chronic kidney disease: Secondary | ICD-10-CM | POA: Diagnosis present

## 2017-12-06 LAB — CBC WITH DIFFERENTIAL/PLATELET
Abs Immature Granulocytes: 0.1 10*3/uL (ref 0.0–0.1)
Basophils Absolute: 0.1 10*3/uL (ref 0.0–0.1)
Basophils Relative: 0 %
Eosinophils Absolute: 0.1 10*3/uL (ref 0.0–0.7)
Eosinophils Relative: 1 %
HCT: 29.7 % — ABNORMAL LOW (ref 39.0–52.0)
Hemoglobin: 9.1 g/dL — ABNORMAL LOW (ref 13.0–17.0)
Immature Granulocytes: 1 %
Lymphocytes Relative: 6 %
Lymphs Abs: 0.9 10*3/uL (ref 0.7–4.0)
MCH: 28.5 pg (ref 26.0–34.0)
MCHC: 30.6 g/dL (ref 30.0–36.0)
MCV: 93.1 fL (ref 78.0–100.0)
Monocytes Absolute: 1.4 10*3/uL — ABNORMAL HIGH (ref 0.1–1.0)
Monocytes Relative: 10 %
Neutro Abs: 11.5 10*3/uL — ABNORMAL HIGH (ref 1.7–7.7)
Neutrophils Relative %: 82 %
Platelets: 294 10*3/uL (ref 150–400)
RBC: 3.19 MIL/uL — ABNORMAL LOW (ref 4.22–5.81)
RDW: 17.2 % — ABNORMAL HIGH (ref 11.5–15.5)
WBC: 14 10*3/uL — ABNORMAL HIGH (ref 4.0–10.5)

## 2017-12-06 LAB — I-STAT CG4 LACTIC ACID, ED
Lactic Acid, Venous: 2.29 mmol/L (ref 0.5–1.9)
Lactic Acid, Venous: 2.2 mmol/L (ref 0.5–1.9)

## 2017-12-06 LAB — COMPREHENSIVE METABOLIC PANEL
ALT: 7 U/L (ref 0–44)
AST: 26 U/L (ref 15–41)
Albumin: 3.3 g/dL — ABNORMAL LOW (ref 3.5–5.0)
Alkaline Phosphatase: 49 U/L (ref 38–126)
Anion gap: 17 — ABNORMAL HIGH (ref 5–15)
BUN: 54 mg/dL — ABNORMAL HIGH (ref 8–23)
CO2: 26 mmol/L (ref 22–32)
Calcium: 9.8 mg/dL (ref 8.9–10.3)
Chloride: 95 mmol/L — ABNORMAL LOW (ref 98–111)
Creatinine, Ser: 10.6 mg/dL — ABNORMAL HIGH (ref 0.61–1.24)
GFR calc Af Amer: 5 mL/min — ABNORMAL LOW (ref >60)
GFR calc non Af Amer: 4 mL/min — ABNORMAL LOW (ref >60)
Glucose, Bld: 101 mg/dL — ABNORMAL HIGH (ref 70–99)
Potassium: 6.1 mmol/L — ABNORMAL HIGH (ref 3.5–5.1)
Sodium: 138 mmol/L (ref 135–145)
Total Bilirubin: 0.9 mg/dL (ref 0.3–1.2)
Total Protein: 6.7 g/dL (ref 6.5–8.1)

## 2017-12-06 LAB — GLUCOSE, CAPILLARY: GLUCOSE-CAPILLARY: 87 mg/dL (ref 70–99)

## 2017-12-06 LAB — LIPASE, BLOOD: Lipase: 34 U/L (ref 11–51)

## 2017-12-06 LAB — POTASSIUM: Potassium: 5.6 mmol/L — ABNORMAL HIGH (ref 3.5–5.1)

## 2017-12-06 MED ORDER — SODIUM BICARBONATE 8.4 % IV SOLN
50.0000 meq | Freq: Once | INTRAVENOUS | Status: AC
  Administered 2017-12-06: 50 meq via INTRAVENOUS
  Filled 2017-12-06: qty 50

## 2017-12-06 MED ORDER — RENA-VITE PO TABS
1.0000 | ORAL_TABLET | Freq: Every day | ORAL | Status: DC
  Administered 2017-12-07: 1 via ORAL
  Filled 2017-12-06: qty 1

## 2017-12-06 MED ORDER — INSULIN ASPART 100 UNIT/ML ~~LOC~~ SOLN
10.0000 [IU] | Freq: Once | SUBCUTANEOUS | Status: AC
  Administered 2017-12-06: 10 [IU] via INTRAVENOUS
  Filled 2017-12-06: qty 1

## 2017-12-06 MED ORDER — PANTOPRAZOLE SODIUM 40 MG PO TBEC
40.0000 mg | DELAYED_RELEASE_TABLET | Freq: Two times a day (BID) | ORAL | Status: DC
  Administered 2017-12-06 – 2017-12-08 (×3): 40 mg via ORAL
  Filled 2017-12-06 (×3): qty 1

## 2017-12-06 MED ORDER — IOHEXOL 300 MG/ML  SOLN
100.0000 mL | Freq: Once | INTRAMUSCULAR | Status: AC | PRN
  Administered 2017-12-06: 100 mL via INTRAVENOUS

## 2017-12-06 MED ORDER — TRAMADOL HCL 50 MG PO TABS: 50.0000 mg | ORAL_TABLET | Freq: Four times a day (QID) | ORAL | Status: DC | PRN

## 2017-12-06 MED ORDER — CALCIUM ACETATE 667 MG PO CAPS: 1334.0000 mg | ORAL_CAPSULE | ORAL | Status: DC

## 2017-12-06 MED ORDER — SEVELAMER CARBONATE 2.4 G PO PACK
7.2000 g | PACK | Freq: Three times a day (TID) | ORAL | Status: DC
  Administered 2017-12-07 – 2017-12-08 (×4): 7.2 g via ORAL
  Filled 2017-12-06 (×4): qty 3

## 2017-12-06 MED ORDER — VITAMIN D 1000 UNITS PO TABS
1000.0000 [IU] | ORAL_TABLET | Freq: Every day | ORAL | Status: DC
  Administered 2017-12-07: 1000 [IU] via ORAL
  Filled 2017-12-06: qty 1

## 2017-12-06 MED ORDER — GABAPENTIN 100 MG PO CAPS
100.0000 mg | ORAL_CAPSULE | Freq: Every day | ORAL | Status: DC
  Administered 2017-12-06 – 2017-12-07 (×2): 100 mg via ORAL
  Filled 2017-12-06 (×2): qty 1

## 2017-12-06 MED ORDER — CALCIUM CARBONATE ANTACID 500 MG PO CHEW
3000.0000 mg | CHEWABLE_TABLET | Freq: Two times a day (BID) | ORAL | Status: DC
  Administered 2017-12-07 – 2017-12-08 (×3): 3000 mg via ORAL
  Filled 2017-12-06 (×3): qty 15

## 2017-12-06 MED ORDER — HEPARIN SODIUM (PORCINE) 5000 UNIT/ML IJ SOLN
5000.0000 [IU] | Freq: Three times a day (TID) | INTRAMUSCULAR | Status: DC
  Administered 2017-12-06 – 2017-12-08 (×5): 5000 [IU] via SUBCUTANEOUS
  Filled 2017-12-06 (×3): qty 1

## 2017-12-06 MED ORDER — LIDOCAINE-PRILOCAINE 2.5-2.5 % EX CREA: 1.0000 "application " | TOPICAL_CREAM | CUTANEOUS | Status: DC | PRN

## 2017-12-06 MED ORDER — HYDRALAZINE HCL 20 MG/ML IJ SOLN: 5.0000 mg | Freq: Four times a day (QID) | INTRAMUSCULAR | Status: DC | PRN

## 2017-12-06 MED ORDER — AMLODIPINE BESYLATE 10 MG PO TABS
10.0000 mg | ORAL_TABLET | Freq: Every day | ORAL | Status: DC
  Administered 2017-12-06 – 2017-12-07 (×2): 10 mg via ORAL
  Filled 2017-12-06: qty 2
  Filled 2017-12-06: qty 1

## 2017-12-06 MED ORDER — ATORVASTATIN CALCIUM 40 MG PO TABS
40.0000 mg | ORAL_TABLET | Freq: Every day | ORAL | Status: DC
  Administered 2017-12-07: 40 mg via ORAL
  Filled 2017-12-06: qty 1

## 2017-12-06 MED ORDER — LISINOPRIL 10 MG PO TABS
10.0000 mg | ORAL_TABLET | Freq: Every day | ORAL | Status: DC
  Administered 2017-12-06 – 2017-12-07 (×2): 10 mg via ORAL
  Filled 2017-12-06 (×2): qty 1

## 2017-12-06 MED ORDER — CHLORHEXIDINE GLUCONATE CLOTH 2 % EX PADS: 6.0000 | MEDICATED_PAD | Freq: Every day | CUTANEOUS | Status: DC

## 2017-12-06 MED ORDER — CALCIUM ACETATE (PHOS BINDER) 667 MG PO CAPS
2001.0000 mg | ORAL_CAPSULE | Freq: Three times a day (TID) | ORAL | Status: DC
  Administered 2017-12-07 – 2017-12-08 (×4): 2001 mg via ORAL
  Filled 2017-12-06 (×4): qty 3

## 2017-12-06 MED ORDER — DEXTROSE 50 % IV SOLN
50.0000 mL | Freq: Once | INTRAVENOUS | Status: AC
  Administered 2017-12-06: 50 mL via INTRAVENOUS
  Filled 2017-12-06: qty 50

## 2017-12-06 MED ORDER — PIPERACILLIN-TAZOBACTAM 3.375 G IVPB 30 MIN
3.3750 g | Freq: Once | INTRAVENOUS | Status: AC
  Administered 2017-12-06: 3.375 g via INTRAVENOUS
  Filled 2017-12-06: qty 50

## 2017-12-06 MED ORDER — SODIUM CHLORIDE 0.9 % IV SOLN
1.0000 g | Freq: Once | INTRAVENOUS | Status: AC
  Administered 2017-12-06: 1 g via INTRAVENOUS
  Filled 2017-12-06: qty 10

## 2017-12-06 MED ORDER — CALCIUM ACETATE (PHOS BINDER) 667 MG PO CAPS: 1334.0000 mg | ORAL_CAPSULE | ORAL | Status: DC

## 2017-12-06 MED ORDER — ALLOPURINOL 100 MG PO TABS
100.0000 mg | ORAL_TABLET | Freq: Every day | ORAL | Status: DC
  Administered 2017-12-07: 100 mg via ORAL
  Filled 2017-12-06: qty 1

## 2017-12-06 MED ORDER — DOCUSATE SODIUM 100 MG PO CAPS
100.0000 mg | ORAL_CAPSULE | Freq: Two times a day (BID) | ORAL | Status: DC
  Administered 2017-12-06 – 2017-12-07 (×2): 100 mg via ORAL
  Filled 2017-12-06 (×3): qty 1

## 2017-12-06 MED ORDER — BISACODYL 10 MG RE SUPP
10.0000 mg | Freq: Once | RECTAL | Status: AC
  Administered 2017-12-06: 10 mg via RECTAL
  Filled 2017-12-06: qty 1

## 2017-12-06 MED ORDER — ACETAMINOPHEN 500 MG PO TABS
1000.0000 mg | ORAL_TABLET | Freq: Once | ORAL | Status: AC
  Administered 2017-12-06: 1000 mg via ORAL
  Filled 2017-12-06: qty 2

## 2017-12-06 NOTE — Progress Notes (Signed)
Report received from ER RN . Room ready.  

## 2017-12-06 NOTE — H&P (Addendum)
History and Physical    Sean Best BJY:[id number] DOB: January 20, 1934 DOA: 12/06/2017  PCP: Seward Carol, MD Patient coming from: Home  Chief Complaint: Abdominal pain  HPI: Sean Best is a 84 y.o. male with medical history significant of end-stage renal disease on hemodialysis (MWF), type 2 diabetes, hypertension, hyperlipidemia, CVA, presence of permanent cardiac pacemaker presenting to the hospital with a chief complaint of abdominal pain.  Patient reports having generalized, 2 out of 10 intensity abdominal pain since yesterday.  States he has been constipated for the past 1 week and has been belching.  He denies having any nausea or vomiting.  He is not sure if he has been having fevers or chills.  States he missed dialysis on Friday.  Denies having any chest pain or shortness of breath.  Patient is a poor historian and no further history could be obtained from him.  Per chart review, patient underwent lap cholecystectomy for acute on chronic cholecystitis on December 02, 2017 by Dr. Johney Maine.   ED Course: T-max 100.2.  Tachycardic and hypertensive on arrival.  White count 14.  Hemoglobin stable at 9.1; was 8.7 three days ago. Lipase normal.  LFTs normal.  Lactic acid 2.2.  Potassium 6.1.  EKG with slightly peaked T waves.  Chest x-ray showing no active cardiopulmonary disease.  CT abdomen pelvis showing minimal amount of fluid within the gallbladder fossa and haziness of surrounding fat planes, irregular thickening of the pyloric region patient, fluid-filled portions of colon, moderate stool rectosigmoid region, and multiple renal lesions which are calcified and complex. Patient was seen by Dr. Kae Heller from surgery and she believes his abdominal pain is related to constipation.  Although suspicion for biliary injury or leak is low, she is recommending HIDA scan to confirm, empiric antibiotics (Zosyn), and bowel regimen.  For pyloric thickening (inflammation versus mass), she is recommending further  evaluation with endoscopy.  ED physician spoke to nephrology Dr. Augustin Coupe, plan is to dialyze the patient in the morning. For hyperkalemia, patient received dextrose, 10 units NovoLog, and sodium bicarbonate 50 mEq in the ED.  Review of Systems: As per HPI otherwise 10 point review of systems negative.  Past Medical History:  Diagnosis Date  . Anemia   . Arthritis    "knees" (04/11/2016)  . Blind left eye    S/P trauma  . Chronic total occlusion of artery of the extremities (HCC)    pt not aware of this  . Claudication ([place])   . ESRD on dialysis [facility])    "MWF; [name]" ((04/10/2016)  . ESRD on peritoneal dialysis [facility])    Started dialysis around April 2015 per son.  Has been doing peritoneal dialysis at home.     . Gout   . Hyperlipidemia   . Hypertension   . Overweight(278.02)   . Presence of permanent cardiac pacemaker    medtronic  . Prostate cancer ([place]) 1990s  . Shingles   . Stroke [facility]) ~1998   denies residual on 04/11/2016  . Type II diabetes mellitus (HCC)    diet controlled  . Umbilical hernia 0/86/5784    Past Surgical History:  Procedure Laterality Date  . AV FISTULA PLACEMENT, RADIOCEPHALIC  69/62/9528   Left arm  . CAPD INSERTION N/A 03/06/2013   Procedure: LAPAROSCOPIC INSERTION CONTINUOUS AMBULATORY PERITONEAL DIALYSIS  (CAPD) CATHETER;  Surgeon: Edward Jolly, MD;  Location: [place];  Service: General;  Laterality: N/A;  . CATARACT EXTRACTION Right   . CHOLECYSTECTOMY  12/02/2017   LAPROSCOPIC  .  EYE SURGERY Left 1970s   for eye injury  . HERNIA REPAIR    . INSERTION PROSTATE RADIATION SEED    . LAPAROSCOPIC CHOLECYSTECTOMY SINGLE SITE WITH INTRAOPERATIVE CHOLANGIOGRAM N/A 12/02/2017   Procedure: LAPAROSCOPIC CHOLECYSTECTOMY;  Surgeon: Michael Boston, MD;  Location: [place];  Service: General;  Laterality: N/A;  . PACEMAKER IMPLANT N/A 04/20/2017   Procedure: PACEMAKER IMPLANT;  Surgeon: Evans Lance, MD;  Location: [place] CV LAB;  Service:  Cardiovascular;  Laterality: N/A;  . UMBILICAL HERNIA REPAIR N/A 03/06/2013   Procedure: HERNIA REPAIR UMBILICAL WITH MESH;  Surgeon: Edward Jolly, MD;  Location: [place];  Service: General;  Laterality: N/A;     reports that he has never smoked. He has never used smokeless tobacco. He reports that he does not drink alcohol or use drugs.  Allergies  Allergen Reactions  . Cardura [Doxazosin Mesylate] Other (See Comments)    Hallucinations    Family History  Problem Relation Age of Onset  . Cancer Mother   . Heart disease Mother   . Cancer Father     Prior to Admission medications   Medication Sig Start Date End Date Taking? Authorizing Provider  allopurinol (ZYLOPRIM) 100 MG tablet Take 100 mg by mouth daily.     Yes [provider]  amLODipine (NORVASC) 10 MG tablet Take 10 mg by mouth at bedtime.  01/04/17  Yes [provider]  atorvastatin (LIPITOR) 40 MG tablet Take 40 mg by mouth daily. 07/07/17  Yes [provider]  calcium acetate (PHOSLO) 667 MG capsule Take 1,334-2,001 mg by mouth See admin instructions. Take 2001 mg by mouth 3 times daily with meals and take 1344 mg by mouth with snacks   Yes [provider]  calcium carbonate (TUMS EX) 750 MG chewable tablet Chew 4 tablets by mouth 2 (two) times daily.   Yes [provider]  Cholecalciferol (VITAMIN D-3) 1000 units CAPS Take 1,000 Units by mouth daily.   Yes [provider]  gabapentin (NEURONTIN) 100 MG capsule Take 100 mg by mouth at bedtime. 12/27/16  Yes [provider]  lidocaine-prilocaine (EMLA) cream Apply 1 application topically as needed (for port access).  10/28/17  Yes [provider]  lisinopril (PRINIVIL,ZESTRIL) 10 MG tablet Take 10 mg by mouth at bedtime. 03/25/17  Yes [provider]  loperamide (IMODIUM A-D) 2 MG tablet Take 4 mg by mouth 4 (four) times daily as needed for diarrhea or loose stools.   Yes [provider]    multivitamin (RENA-VIT) TABS tablet Take 1 tablet by mouth daily.   Yes [provider]  sevelamer carbonate (RENVELA) 2.4 g PACK Take 7.2 g by mouth 3 (three) times daily with meals.  10/13/17  Yes [provider]  traMADol (ULTRAM) 50 MG tablet Take 1 tablet (50 mg total) by mouth every 6 (six) hours as needed for moderate pain or severe pain. 12/02/17  Yes Michael Boston, MD  ibuprofen (ADVIL,MOTRIN) 200 MG tablet Take 400 mg by mouth every 6 (six) hours as needed for headache or moderate pain.    [provider]    Physical Exam: Vitals:   12/06/17 1830 12/06/17 1845 12/06/17 1900 12/06/17 1915  BP: (!) 157/87 (!) 157/93 (!) 143/84 139/80  Pulse: (!) 106 (!) 103 (!) 102 100  Resp: (!) 26 13 16 16   Temp:      TempSrc:      SpO2: 94% 96% 95% 94%  Weight:  Height:        Physical Exam  Constitutional: He is oriented to person, place, and time. No distress.  Resting comfortably in a hospital stretcher  HENT:  Mouth/Throat: Oropharynx is clear and moist.  Eyes: Right eye exhibits no discharge. Left eye exhibits no discharge.  Neck: Neck supple. No tracheal deviation present.  Cardiovascular: Normal rate, regular rhythm and intact distal pulses.  Murmur heard. Pulmonary/Chest: Effort normal and breath sounds normal. No respiratory distress. He has no wheezes. He has no rales.  Abdominal: Soft. Bowel sounds are normal. He exhibits distension. There is no tenderness.  Musculoskeletal:  Trace pedal edema  Neurological: He is alert and oriented to person, place, and time.  Skin: Skin is warm and dry. He is not diaphoretic.     Labs on Admission: I have personally reviewed following labs and imaging studies  CBC: Recent Labs  Lab 12/02/17 0616 12/03/17 0843 12/06/17 1419  WBC  --  9.6 14.0*  NEUTROABS  --   --  11.5*  HGB 10.5* 8.7* 9.1*  HCT 31.0* 28.4* 29.7*  MCV  --  93.4 93.1  PLT  --  264 379   Basic Metabolic Panel: Recent Labs  Lab  12/02/17 0616 12/03/17 0843 12/06/17 1419  NA 140 139 138  K 4.1 4.6 6.1*  CL  --  97* 95*  CO2  --  27 26  GLUCOSE 94 110* 101*  BUN  --  43* 54*  CREATININE  --  9.94* 10.60*  CALCIUM  --  9.5 9.8  PHOS  --  9.1*  --    GFR: Estimated Creatinine Clearance: 5.5 mL/min (A) (by C-G formula based on SCr of 10.6 mg/dL (H)). Liver Function Tests: Recent Labs  Lab 12/03/17 0843 12/06/17 1419  AST  --  26  ALT  --  7  ALKPHOS  --  49  BILITOT  --  0.9  PROT  --  6.7  ALBUMIN 3.0* 3.3*   Recent Labs  Lab 12/06/17 1419  LIPASE 34   No results for input(s): AMMONIA in the last 168 hours. Coagulation Profile: No results for input(s): INR, PROTIME in the last 168 hours. Cardiac Enzymes: No results for input(s): CKTOTAL, CKMB, CKMBINDEX, TROPONINI in the last 168 hours. BNP (last 3 results) No results for input(s): PROBNP in the last 8760 hours. HbA1C: No results for input(s): HGBA1C in the last 72 hours. CBG: Recent Labs  Lab 12/02/17 0602 12/02/17 0929  GLUCAP 93 120*   Lipid Profile: No results for input(s): CHOL, HDL, LDLCALC, TRIG, CHOLHDL, LDLDIRECT in the last 72 hours. Thyroid Function Tests: No results for input(s): TSH, T4TOTAL, FREET4, T3FREE, THYROIDAB in the last 72 hours. Anemia Panel: No results for input(s): VITAMINB12, FOLATE, FERRITIN, TIBC, IRON, RETICCTPCT in the last 72 hours. Urine analysis:    Component Value Date/Time   COLORURINE YELLOW 12/29/2014 0550   APPEARANCEUR CLOUDY (A) 12/29/2014 0550   LABSPEC 1.020 12/29/2014 0550   PHURINE 5.0 12/29/2014 0550   GLUCOSEU NEGATIVE 12/29/2014 0550   HGBUR SMALL (A) 12/29/2014 0550   BILIRUBINUR NEGATIVE 12/29/2014 0550   KETONESUR NEGATIVE 12/29/2014 0550   PROTEINUR 100 (A) 12/29/2014 0550   UROBILINOGEN 0.2 12/29/2014 0550   NITRITE NEGATIVE 12/29/2014 0550   LEUKOCYTESUR SMALL (A) 12/29/2014 0550    Radiological Exams on Admission: Dg Chest 2 View  Result Date: 12/06/2017 CLINICAL  DATA:  Generalized weakness and fever and abdominal pain for the past 2 days. EXAM: CHEST - 2  VIEW COMPARISON:  Chest x-ray of September 01, 2017 FINDINGS: The lungs are reasonably well inflated. There is no focal infiltrate. There is no pleural effusion. There is some thickening of the major fissure on the right which is stable. The cardiac silhouette is enlarged. The pulmonary vascularity is not engorged. There is calcification in the wall of the aortic arch. The trachea is midline. The ICD is in stable position. The bony thorax exhibits no acute abnormality. IMPRESSION: There is no active cardiopulmonary disease. Thoracic aortic atherosclerosis. Electronically Signed   By: David  Martinique M.D.   On: 12/06/2017 15:09   Ct Abdomen Pelvis W Contrast  Result Date: 12/06/2017 CLINICAL DATA:  84 year old male with diffuse abdominal pain and constipation for 2 days. Post laparoscopic cholecystectomy 12/02/2017. Initial encounter. EXAM: CT ABDOMEN AND PELVIS WITH CONTRAST TECHNIQUE: Multidetector CT imaging of the abdomen and pelvis was performed using the standard protocol following bolus administration of intravenous contrast. CONTRAST:  158mL OMNIPAQUE IOHEXOL 300 MG/ML  SOLN COMPARISON:  05/15/2016 CT. FINDINGS: Lower chest: Bibasilar atelectasis. Cardiomegaly. Sequential pacemaker in place. Aortic and mitral valve calcification. Coronary artery calcification. Hepatobiliary: Post recent cholecystectomy. Minimal amount of fluid within the gallbladder fossa and haziness of surrounding fat planes may be related to recent surgery rather than leak or infection. No worrisome hepatic lesion noted. Pancreas: No pancreatic mass or primary pancreatic inflammation. Spleen: No splenic mass or enlargement. Adrenals/Urinary Tract: No obstructing stone or hydronephrosis. Multiple renal lesions some of which are calcified and complex. Largest lesion is 3.5 cm in left upper pole and relatively similar to prior exam. Hyperplasia adrenal  glands. Decompressed urinary bladder with circumferential wall thickening. Stomach/Bowel: Irregular thickening of the pyloric region. Question inflammation or mass. Fluid-filled portions of colon. Moderate stool rectosigmoid region. Taking into account slight limitation by motion degradation, no extraluminal bowel inflammatory process is noted. Specifically, no inflammation surrounds the appendix or small number of diverticula. Vascular/Lymphatic: Atherosclerotic changes aorta and aortic branch vessels with infrarenal 3.1 cm abdominal aortic aneurysm similar to prior exam. No narrowing of vessels but without large vessel occlusion. Scattered normal size lymph nodes. Reproductive: Post prostatic seed implantation. Other: No free air or bowel containing hernia. Mild diastases rectus muscles. Laparoscopic ports noted. Musculoskeletal: Severe degenerative changes L3-4 through L5-S1. Bilateral hip joint degenerative changes. IMPRESSION: 1. Post recent cholecystectomy. Minimal amount of fluid within the gallbladder fossa and haziness of surrounding fat planes may be related to recent surgery rather than leak or infection. However, if there were progressive symptoms, recommend close follow-up imaging to evaluate this region. 2. Irregular thickening of the pyloric region. Question inflammation or mass. 3. Fluid-filled portions of colon. Moderate stool rectosigmoid region. Taking into account slight limitation by motion degradation, no extraluminal bowel inflammatory process is noted. 4. Multiple renal lesions some of which are calcified and complex. Largest lesion is 3.5 cm in left upper pole and relatively similar to prior exam. 5. Aortic Atherosclerosis (ICD10-I70.0). Stable 3.1 cm abdominal aortic aneurysm. Recommend followup by ultrasound in 3 years. This recommendation follows ACR consensus guidelines: White Paper of the ACR Incidental Findings Committee II on Vascular Findings. J Am Coll Radiol 2013; 46:659-935 6.  Post prostatic seed implant. 7. Severe degenerative changes L3-4 through L5-S1. Bilateral hip joint degenerative changes. 8. Bibasilar atelectasis. 9. Cardiomegaly with sequential pacemaker in place. Significant coronary artery calcifications. Electronically Signed   By: Genia Del M.D.   On: 12/06/2017 17:49    EKG: Independently reviewed.  Sinus tachycardia (heart rate 108) and slightly peaked  T waves.  Assessment/Plan Principal Problem:   Abdominal pain Active Problems:   ESRD on dialysis (HCC)   Hyperkalemia   Anemia   HTN (hypertension)   Leukocytosis   Gout   Chronic pain   Abdominal pain T-max 100.2.  Tachycardic on arrival.  White count 14. Lipase normal.  LFTs normal.  Lactic acid 2.2.  Chest x-ray showing no active cardiopulmonary disease.  CT abdomen pelvis showing minimal amount of fluid within the gallbladder fossa and haziness of surrounding fat planes, irregular thickening of the pyloric region patient, fluid-filled portions of colon, and moderate stool rectosigmoid region.   Patient was seen by Dr. Kae Heller from surgery and she believes his abdominal pain is likely related to constipation.  Although suspicion for biliary injury or leak is low, she is recommending HIDA scan to confirm. -NPO at this time; HIDA pending -Zosyn -Bowel regimen for constipation  -CT abdomen showing pyloric thickening (inflammation versus mass), Dr. Kae Heller is recommending further evaluation with endoscopy. Consider consulting gastroenterology in the morning.   Hyperkalemia Potassium 6.1 in the setting of missed hemodialysis.  EKG with slightly peaked T waves.  Patient received dextrose, 10 units NovoLog, and sodium bicarbonate 50 mEq in the ED. ED physician spoke to nephrology Dr. Augustin Coupe, plan is to dialyze the patient in the morning. -Recheck potassium level this evening -Continue monitor; renal function panel in the morning -Repeat EKG in am   Leukocytosis White count 14 and temperature 100.2.  Lactate 2.2 (infection vs ESRD). Chest x-ray without evidence of active infection. CT abdomen pelvis showing minimal amount of fluid within the gallbladder fossa and haziness of surrounding fat planes. Unclear is GI source of infection? -Continue Zosyn -UA pending  -Blood cx x 2 pending  -CBC in am  Anemia Hemoglobin stable at 9.1; was 8.7 three days ago. -Continue to monitor; CBC in a.m.  Gout -Continue home allopurinol  Hypertension Blood pressure elevated.  Likely due to missed hemodialysis. -Continue home amlodipine, lisinopril -IV Hydralazine prn  -Plan is for patient to undergo hemodialysis in the morning.  End-stage renal disease on hemodialysis MWF -Continue renal meds -Hemodialysis planned for tomorrow morning   Well-controlled type 2 diabetes A1c 5.6 in 10/2017. Not on home meds.  -Continue to monitor    Chronic pain -Continue home meds including tramadol and gabapentin  DVT prophylaxis: Subcutaneous heparin Code Status: Full code Family Communication: No family present at bedside. Disposition Plan: Anticipate discharge in 1 to 2 days to home. Consults called: General Surgery (Dr. Kae Heller), nephrology (Dr. Augustin Coupe) Admission status: Inpatient. Patient will need hemodialysis and further workup of abdominal pain pending.   Shela Leff MD [facility] Pager [phone]  If 7PM-7AM, please contact night-coverage [email or web address] Password [email or web address]  12/06/2017, 8:50 PM

## 2017-12-06 NOTE — ED Provider Notes (Signed)
[facility] [place] EMERGENCY DEPARTMENT Provider Note   CSN: [id number] Arrival date & time: 12/06/17  1351     History   Chief Complaint Chief Complaint  Patient presents with  . Abdominal Pain    HPI  Sean Best is a 84 y.o. male.  HPI   84 year old male with abdominal pain, generalized weakness and subjective fevers.  He is status post cholecystectomy on 10/3.  He was discharged on 10/4.  He states he was feeling reasonably well at that time.  The next day he began feeling weaker. He has continued abdominal pain.  He does not feel like it has been necessarily worse since his surgery but does not feel like it is has improved much at this point either.  Feels bloated.  He is passing gas but has not had a bowel movement.  He missed dialysis today as he came to the emergency room.  He reports that he was last dialyzed on Friday.  He has no acute respiratory complaints.  Past Medical History:  Diagnosis Date  . Anemia   . Arthritis    "knees" (04/11/2016)  . Blind left eye    S/P trauma  . Chronic total occlusion of artery of the extremities (HCC)    pt not aware of this  . Claudication ([place])   . ESRD on dialysis [facility])    "MWF; [name]" ((04/10/2016)  . ESRD on peritoneal dialysis [facility])    Started dialysis around April 2015 per son.  Has been doing peritoneal dialysis at home.     . Gout   . Hyperlipidemia   . Hypertension   . Overweight(278.02)   . Presence of permanent cardiac pacemaker    medtronic  . Prostate cancer ([place]) 1990s  . Shingles   . Stroke [facility]) ~1998   denies residual on 04/11/2016  . Type II diabetes mellitus (HCC)    diet controlled  . Umbilical hernia 0/93/2671    Patient Active Problem List   Diagnosis Date Noted  . Acute on chronic cholecystitis s/p lap cholecystectomy 12/02/2017 12/02/2017  . Cardiac pacemaker in situ (Medtronic DDD) 12/02/2017  . Acute hypoxemic respiratory failure ([place]) 04/18/2017  . Heart block 04/17/2017   . Elevated troponin 04/17/2017  . ESRD on hemodialysis ([place]) 04/12/2016  . H/O: stroke 04/11/2016  . Saccular aneurysm of infrarenal aorta with thrombosed dissection of abdominal aorta 04/11/2016  . Obesity (BMI 30-39.9) 04/11/2016  . Syncope 04/11/2016  . Prostate CA ([place]) 12/28/2014  . Chest pain 12/28/2014  . Anemia of chronic disease 03/11/2013  . DM (diabetes mellitus), type 2 with renal complications, diet controlled 03/11/2013  . Benign hypertension 03/11/2013  . Hyperlipidemia 03/11/2013    Past Surgical History:  Procedure Laterality Date  . AV FISTULA PLACEMENT, RADIOCEPHALIC  24/58/0998   Left arm  . CAPD INSERTION N/A 03/06/2013   Procedure: LAPAROSCOPIC INSERTION CONTINUOUS AMBULATORY PERITONEAL DIALYSIS  (CAPD) CATHETER;  Surgeon: Edward Jolly, MD;  Location: [place];  Service: General;  Laterality: N/A;  . CATARACT EXTRACTION Right   . CHOLECYSTECTOMY  12/02/2017   LAPROSCOPIC  . EYE SURGERY Left 1970s   for eye injury  . HERNIA REPAIR    . INSERTION PROSTATE RADIATION SEED    . LAPAROSCOPIC CHOLECYSTECTOMY SINGLE SITE WITH INTRAOPERATIVE CHOLANGIOGRAM N/A 12/02/2017   Procedure: LAPAROSCOPIC CHOLECYSTECTOMY;  Surgeon: Michael Boston, MD;  Location: [place];  Service: General;  Laterality: N/A;  . PACEMAKER IMPLANT N/A 04/20/2017   Procedure: PACEMAKER IMPLANT;  Surgeon: Lovena Le,  Champ Mungo, MD;  Location: [place] CV LAB;  Service: Cardiovascular;  Laterality: N/A;  . UMBILICAL HERNIA REPAIR N/A 03/06/2013   Procedure: HERNIA REPAIR UMBILICAL WITH MESH;  Surgeon: Edward Jolly, MD;  Location: [facility] OR;  Service: General;  Laterality: N/A;        Home Medications    Prior to Admission medications   Medication Sig Start Date End Date Taking? Authorizing Provider  allopurinol (ZYLOPRIM) 100 MG tablet Take 100 mg by mouth daily.      [provider]  amLODipine (NORVASC) 10 MG tablet Take 10 mg by mouth at bedtime.  01/04/17   [provider]    atorvastatin (LIPITOR) 40 MG tablet Take 40 mg by mouth daily. 07/07/17   [provider]  calcium acetate (PHOSLO) 667 MG capsule Take 1,334-2,001 mg by mouth See admin instructions. Take 2001 mg by mouth 3 times daily with meals and take 1344 mg by mouth with snacks    [provider]  calcium carbonate (TUMS EX) 750 MG chewable tablet Chew 4 tablets by mouth 2 (two) times daily.    [provider]  Cholecalciferol (VITAMIN D-3) 1000 units CAPS Take 1,000 Units by mouth daily.    [provider]  gabapentin (NEURONTIN) 100 MG capsule Take 100 mg by mouth at bedtime. 12/27/16   [provider]  ibuprofen (ADVIL,MOTRIN) 200 MG tablet Take 400 mg by mouth every 6 (six) hours as needed for headache or moderate pain.    [provider]  lidocaine-prilocaine (EMLA) cream Apply 1 application topically as needed (for port access).  10/28/17   [provider]  lisinopril (PRINIVIL,ZESTRIL) 10 MG tablet Take 10 mg by mouth at bedtime. 03/25/17   [provider]  loperamide (IMODIUM A-D) 2 MG tablet Take 4 mg by mouth 4 (four) times daily as needed for diarrhea or loose stools.    [provider]  multivitamin (RENA-VIT) TABS tablet Take 1 tablet by mouth daily.    [provider]  sevelamer carbonate (RENVELA) 2.4 g PACK Take 7.2 g by mouth 3 (three) times daily with meals.  10/13/17   [provider]  traMADol (ULTRAM) 50 MG tablet Take 1 tablet (50 mg total) by mouth every 6 (six) hours as needed for moderate pain or severe pain. 12/02/17   Michael Boston, MD    Family History Family History  Problem Relation Age of Onset  . Cancer Mother   . Heart disease Mother   . Cancer Father     Social History Social History   Tobacco Use  . Smoking status: Never Smoker  . Smokeless tobacco: Never Used  Substance Use Topics  . Alcohol use: No  . Drug use: No     Allergies   Cardura [doxazosin  mesylate]   Review of Systems Review of Systems  All systems reviewed and negative, other than as noted in HPI.  Physical Exam Updated Vital Signs BP (!) 166/93 (BP Location: Right Arm)   Pulse (!) 109   Temp 100.2 F (37.9 C) (Rectal)   Resp 20   Ht 5\' 5"  (1.651 m)   Wt 89.4 kg   SpO2 97%   BMI 32.78 kg/m   Physical Exam  Constitutional: He appears well-developed and well-nourished. No distress.  HENT:  Head: Normocephalic and atraumatic.  Eyes: Conjunctivae are normal. Right eye exhibits no discharge. Left eye exhibits no discharge.  Neck: Neck supple.  Cardiovascular: Normal rate, regular rhythm and normal heart  sounds. Exam reveals no gallop and no friction rub.  No murmur heard. Pulmonary/Chest: Effort normal and breath sounds normal. No respiratory distress.  Abdominal: Soft. He exhibits distension. There is tenderness.  Admits distended.  Soft.  Mild diffuse tenderness.  Bandages still in place from recent surgery and are dry.   Musculoskeletal: He exhibits no edema or tenderness.  Neurological: He is alert.  Skin: Skin is warm and dry.  Psychiatric: He has a normal mood and affect. His behavior is normal. Thought content normal.  Nursing note and vitals reviewed.    ED Treatments / Results  Labs (all labs ordered are listed, but only abnormal results are displayed) Labs Reviewed  CBC WITH DIFFERENTIAL/PLATELET - Abnormal; Notable for the following components:      Result Value   WBC 14.0 (*)    RBC 3.19 (*)    Hemoglobin 9.1 (*)    HCT 29.7 (*)    RDW 17.2 (*)    Neutro Abs 11.5 (*)    Monocytes Absolute 1.4 (*)    All other components within normal limits  COMPREHENSIVE METABOLIC PANEL - Abnormal; Notable for the following components:   Potassium 6.1 (*)    Chloride 95 (*)    Glucose, Bld 101 (*)    BUN 54 (*)    Creatinine, Ser 10.60 (*)    Albumin 3.3 (*)    GFR calc non Af Amer 4 (*)    GFR calc Af Amer 5 (*)    Anion gap 17 (*)    All other  components within normal limits  CBC - Abnormal; Notable for the following components:   WBC 13.7 (*)    RBC 3.22 (*)    Hemoglobin 9.0 (*)    HCT 29.9 (*)    RDW 17.1 (*)    All other components within normal limits  RENAL FUNCTION PANEL - Abnormal; Notable for the following components:   Potassium 6.4 (*)    Chloride 96 (*)    BUN 58 (*)    Creatinine, Ser 11.14 (*)    Phosphorus 6.2 (*)    Albumin 2.6 (*)    GFR calc non Af Amer 4 (*)    GFR calc Af Amer 4 (*)    All other components within normal limits  POTASSIUM - Abnormal; Notable for the following components:   Potassium 5.6 (*)    All other components within normal limits  HEPATIC FUNCTION PANEL - Abnormal; Notable for the following components:   Total Protein 5.8 (*)    Albumin 2.7 (*)    All other components within normal limits  GLUCOSE, CAPILLARY - Abnormal; Notable for the following components:   Glucose-Capillary 108 (*)    All other components within normal limits  BASIC METABOLIC PANEL - Abnormal; Notable for the following components:   Chloride 95 (*)    BUN 37 (*)    Creatinine, Ser 8.79 (*)    GFR calc non Af Amer 5 (*)    GFR calc Af Amer 6 (*)    All other components within normal limits  GLUCOSE, CAPILLARY - Abnormal; Notable for the following components:   Glucose-Capillary 120 (*)    All other components within normal limits  I-STAT CG4 LACTIC ACID, ED - Abnormal; Notable for the following components:   Lactic Acid, Venous 2.29 (*)    All other components within normal limits  I-STAT CG4 LACTIC ACID, ED - Abnormal; Notable for the following components:   Lactic Acid, Venous  2.20 (*)    All other components within normal limits  CULTURE, BLOOD (ROUTINE X 2)  CULTURE, BLOOD (ROUTINE X 2)  MRSA PCR SCREENING  LIPASE, BLOOD  GLUCOSE, CAPILLARY  GLUCOSE, CAPILLARY    EKG EKG Interpretation  Date/Time:  Monday December 06 2017 15:40:13 EDT Ventricular Rate:  108 PR Interval:    QRS  Duration: 97 QT Interval:  323 QTC Calculation: 433 R Axis:   -41 Text Interpretation:  Sinus tachycardia Left anterior fascicular block No STEMI.  Confirmed by Nanda Quinton [phone]) on 12/07/2017 3:49:16 PM   Radiology No results found.   Dg Chest 2 View  Result Date: 12/06/2017 CLINICAL DATA:  Generalized weakness and fever and abdominal pain for the past 2 days. EXAM: CHEST - 2 VIEW COMPARISON:  Chest x-ray of September 01, 2017 FINDINGS: The lungs are reasonably well inflated. There is no focal infiltrate. There is no pleural effusion. There is some thickening of the major fissure on the right which is stable. The cardiac silhouette is enlarged. The pulmonary vascularity is not engorged. There is calcification in the wall of the aortic arch. The trachea is midline. The ICD is in stable position. The bony thorax exhibits no acute abnormality. IMPRESSION: There is no active cardiopulmonary disease. Thoracic aortic atherosclerosis. Electronically Signed   By: David  Martinique M.D.   On: 12/06/2017 15:09   Ct Abdomen Pelvis W Contrast  Result Date: 12/06/2017 CLINICAL DATA:  84 year old male with diffuse abdominal pain and constipation for 2 days. Post laparoscopic cholecystectomy 12/02/2017. Initial encounter. EXAM: CT ABDOMEN AND PELVIS WITH CONTRAST TECHNIQUE: Multidetector CT imaging of the abdomen and pelvis was performed using the standard protocol following bolus administration of intravenous contrast. CONTRAST:  135mL OMNIPAQUE IOHEXOL 300 MG/ML  SOLN COMPARISON:  05/15/2016 CT. FINDINGS: Lower chest: Bibasilar atelectasis. Cardiomegaly. Sequential pacemaker in place. Aortic and mitral valve calcification. Coronary artery calcification. Hepatobiliary: Post recent cholecystectomy. Minimal amount of fluid within the gallbladder fossa and haziness of surrounding fat planes may be related to recent surgery rather than leak or infection. No worrisome hepatic lesion noted. Pancreas: No pancreatic mass or  primary pancreatic inflammation. Spleen: No splenic mass or enlargement. Adrenals/Urinary Tract: No obstructing stone or hydronephrosis. Multiple renal lesions some of which are calcified and complex. Largest lesion is 3.5 cm in left upper pole and relatively similar to prior exam. Hyperplasia adrenal glands. Decompressed urinary bladder with circumferential wall thickening. Stomach/Bowel: Irregular thickening of the pyloric region. Question inflammation or mass. Fluid-filled portions of colon. Moderate stool rectosigmoid region. Taking into account slight limitation by motion degradation, no extraluminal bowel inflammatory process is noted. Specifically, no inflammation surrounds the appendix or small number of diverticula. Vascular/Lymphatic: Atherosclerotic changes aorta and aortic branch vessels with infrarenal 3.1 cm abdominal aortic aneurysm similar to prior exam. No narrowing of vessels but without large vessel occlusion. Scattered normal size lymph nodes. Reproductive: Post prostatic seed implantation. Other: No free air or bowel containing hernia. Mild diastases rectus muscles. Laparoscopic ports noted. Musculoskeletal: Severe degenerative changes L3-4 through L5-S1. Bilateral hip joint degenerative changes. IMPRESSION: 1. Post recent cholecystectomy. Minimal amount of fluid within the gallbladder fossa and haziness of surrounding fat planes may be related to recent surgery rather than leak or infection. However, if there were progressive symptoms, recommend close follow-up imaging to evaluate this region. 2. Irregular thickening of the pyloric region. Question inflammation or mass. 3. Fluid-filled portions of colon. Moderate stool rectosigmoid region. Taking into account slight limitation by motion degradation, no extraluminal bowel  inflammatory process is noted. 4. Multiple renal lesions some of which are calcified and complex. Largest lesion is 3.5 cm in left upper pole and relatively similar to prior  exam. 5. Aortic Atherosclerosis (ICD10-I70.0). Stable 3.1 cm abdominal aortic aneurysm. Recommend followup by ultrasound in 3 years. This recommendation follows ACR consensus guidelines: White Paper of the ACR Incidental Findings Committee II on Vascular Findings. J Am Coll Radiol 2013; 84:166-063 6. Post prostatic seed implant. 7. Severe degenerative changes L3-4 through L5-S1. Bilateral hip joint degenerative changes. 8. Bibasilar atelectasis. 9. Cardiomegaly with sequential pacemaker in place. Significant coronary artery calcifications. Electronically Signed   By: Genia Del M.D.   On: 12/06/2017 17:49    Procedures Procedures (including critical care time)  Medications Ordered in ED Medications - No data to display   Initial Impression / Assessment and Plan / ED Course  I have reviewed the triage vital signs and the nursing notes.  Pertinent labs & imaging results that were available during my care of the patient were reviewed by me and considered in my medical decision making (see chart for details).    84 year old male with generalized weakness some subjective fevers.  Some abdominal discomfort status post recent cholecystectomy.  I suspect he has an ileus.  Unclear as the source of his fever.  No specific respiratory complaints.  Chest x-ray without focal abnormality.  He states that he makes minimal urine.  Will CT given his recent surgery.  He is hyperkalemic.  He was treated medically with calcium, bicarb and insulin.  I do not want to give him Kayexalate given the concern for an ileus.  Final Clinical Impressions(s) / ED Diagnoses   Final diagnoses:  Generalized abdominal pain  Hyperkalemia  ESRD (end stage renal disease) [facility])    ED Discharge Orders    None       Virgel Manifold, MD 12/11/17 1904

## 2017-12-06 NOTE — ED Notes (Signed)
ED Provider at bedside. 

## 2017-12-06 NOTE — ED Notes (Signed)
General Surgery bedside for evaluation

## 2017-12-06 NOTE — ED Triage Notes (Signed)
Pt arrives to ED from home with complaints of abdoiminal pain since 2 days ago. EMS reports pt states he is constipated and has not had a BM for 2 days. Pt is dialysis pt, missed tx today, last went Friday. Pt placed in position of comfort with bed locked and lowered, call bell in reach.

## 2017-12-06 NOTE — Progress Notes (Signed)
Surgical Consultation Requesting provider: Dr. Vanita Panda  CC: post-op patient  HPI: history taken from chart review and other providers as the patient is somnolent and unable to provide a clear history.  84 year old man with multiple significant medical problems as listed below most notable for ESRD on dialysis Monday Wednesday Friday (he skipped dialysis today, has not had since Friday), diabetes, cardiovascular disease with pacemaker in place,who is postop day 4 from laparoscopic cholecystectomy for chronic cholecystitis. Operative note describes significant chronic inflammation and fibrosis, friable and oozing liver bed and irrigation with several liters of saline. He was observed overnight following the surgery and had dialysis while inpatient on Friday 10/4.  Patient brought to the emergency department by EMS which was called by his daughter reportedly with complaints of abdominal pain since 2 days ago. He also reports constipation and has not had a bowel movement for 2 days. He has a low-grade temp of 37.9, is mildly tachycardic with a mild elevation in lactate (insetting of renal failure). Hyperkalemic, bicarbonate is normal, LFTs are normal. His white count is 14 from 9.6 and his hemoglobin is 9.1 from 8.7, platelets are also up 294 from 264.  Blood cultures are pending, chest x-ray does not show any pneumonia. CT of the abdomen and pelvis with a minimal amount of fluid in the gallbladder fossa and haziness surrounding fat planes- I have reviewed the images myself and based on the CT images there is no evidence of complication of his gallbladder surgery. There is also irregular thickening of the pyloric region of question inflammation or mass. Constipation.  Allergies  Allergen Reactions  . Cardura [Doxazosin Mesylate] Other (See Comments)    Hallucinations    Past Medical History:  Diagnosis Date  . Anemia   . Arthritis    "knees" (04/11/2016)  . Blind left eye    S/P trauma  . Chronic  total occlusion of artery of the extremities (HCC)    pt not aware of this  . Claudication ([place])   . ESRD on dialysis [facility])    "MWF; [name]" ((04/10/2016)  . ESRD on peritoneal dialysis [facility])    Started dialysis around April 2015 per son.  Has been doing peritoneal dialysis at home.     . Gout   . Hyperlipidemia   . Hypertension   . Overweight(278.02)   . Presence of permanent cardiac pacemaker    medtronic  . Prostate cancer ([place]) 1990s  . Shingles   . Stroke [facility]) ~1998   denies residual on 04/11/2016  . Type II diabetes mellitus (HCC)    diet controlled  . Umbilical hernia 8/65/7846    Past Surgical History:  Procedure Laterality Date  . AV FISTULA PLACEMENT, RADIOCEPHALIC  96/29/5284   Left arm  . CAPD INSERTION N/A 03/06/2013   Procedure: LAPAROSCOPIC INSERTION CONTINUOUS AMBULATORY PERITONEAL DIALYSIS  (CAPD) CATHETER;  Surgeon: Edward Jolly, MD;  Location: [place];  Service: General;  Laterality: N/A;  . CATARACT EXTRACTION Right   . CHOLECYSTECTOMY  12/02/2017   LAPROSCOPIC  . EYE SURGERY Left 1970s   for eye injury  . HERNIA REPAIR    . INSERTION PROSTATE RADIATION SEED    . LAPAROSCOPIC CHOLECYSTECTOMY SINGLE SITE WITH INTRAOPERATIVE CHOLANGIOGRAM N/A 12/02/2017   Procedure: LAPAROSCOPIC CHOLECYSTECTOMY;  Surgeon: Michael Boston, MD;  Location: [place];  Service: General;  Laterality: N/A;  . PACEMAKER IMPLANT N/A 04/20/2017   Procedure: PACEMAKER IMPLANT;  Surgeon: Evans Lance, MD;  Location: [place] [facility];  Service:  Cardiovascular;  Laterality: N/A;  . UMBILICAL HERNIA REPAIR N/A 03/06/2013   Procedure: HERNIA REPAIR UMBILICAL WITH MESH;  Surgeon: Edward Jolly, MD;  Location: [facility];  Service: General;  Laterality: N/A;    Family History  Problem Relation Age of Onset  . Cancer Mother   . Heart disease Mother   . Cancer Father     Social History   Socioeconomic History  . Marital status: Widowed    Spouse name: Not on file  . Number of  children: Not on file  . Years of education: Not on file  . Highest education level: Not on file  Occupational History  . Not on file  Social Needs  . Financial resource strain: Not on file  . Food insecurity:    Worry: Not on file    Inability: Not on file  . Transportation needs:    Medical: Not on file    Non-medical: Not on file  Tobacco Use  . Smoking status: Never Smoker  . Smokeless tobacco: Never Used  Substance and Sexual Activity  . Alcohol use: No  . Drug use: No  . Sexual activity: Not Currently  Lifestyle  . Physical activity:    Days per week: Not on file    Minutes per session: Not on file  . Stress: Not on file  Relationships  . Social connections:    Talks on phone: Not on file    Gets together: Not on file    Attends religious service: Not on file    Active member of club or organization: Not on file    Attends meetings of clubs or organizations: Not on file    Relationship status: Not on file  Other Topics Concern  . Not on file  Social History Narrative  . Not on file    No current facility-administered medications on file prior to encounter.    Current Outpatient Medications on File Prior to Encounter  Medication Sig Dispense Refill  . allopurinol (ZYLOPRIM) 100 MG tablet Take 100 mg by mouth daily.      Marland Kitchen amLODipine (NORVASC) 10 MG tablet Take 10 mg by mouth at bedtime.   9  . atorvastatin (LIPITOR) 40 MG tablet Take 40 mg by mouth daily.  3  . calcium acetate (PHOSLO) 667 MG capsule Take 1,334-2,001 mg by mouth See admin instructions. Take 2001 mg by mouth 3 times daily with meals and take 1344 mg by mouth with snacks    . calcium carbonate (TUMS EX) 750 MG chewable tablet Chew 4 tablets by mouth 2 (two) times daily.    . Cholecalciferol (VITAMIN D-3) 1000 units CAPS Take 1,000 Units by mouth daily.    Marland Kitchen gabapentin (NEURONTIN) 100 MG capsule Take 100 mg by mouth at bedtime.  10  . ibuprofen (ADVIL,MOTRIN) 200 MG tablet Take 400 mg by mouth  every 6 (six) hours as needed for headache or moderate pain.    Marland Kitchen lidocaine-prilocaine (EMLA) cream Apply 1 application topically as needed (for port access).   11  . lisinopril (PRINIVIL,ZESTRIL) 10 MG tablet Take 10 mg by mouth at bedtime.  3  . loperamide (IMODIUM A-D) 2 MG tablet Take 4 mg by mouth 4 (four) times daily as needed for diarrhea or loose stools.    . multivitamin (RENA-VIT) TABS tablet Take 1 tablet by mouth daily.    . sevelamer carbonate (RENVELA) 2.4 g PACK Take 7.2 g by mouth 3 (three) times daily with meals.   3  .  traMADol (ULTRAM) 50 MG tablet Take 1 tablet (50 mg total) by mouth every 6 (six) hours as needed for moderate pain or severe pain. 20 tablet 0    Review of Systems: a complete, 10pt review of systems was unable to be completed due to patient mental status  Physical Exam: Vitals:   12/06/17 1800 12/06/17 1815  BP: (!) 150/84 138/87  Pulse: (!) 107 (!) 107  Resp: 17 17  Temp:    SpO2: 95% 95%   Gen: somnolent, no distress, weeks up and answer some questions with gentle sternal rub Head: normocephalic, atraumatic Eyes: extraocular motions intact, anicteric.  Neck: supple without mass or thyromegaly Chest: unlabored respirations, symmetrical air entry Cardiovascular: heart rate in the low 100s,, mild bilateral pitting lower extremity edema. Left forearm AV fistula with palpable thrill. Abdomen: soft, obese, nondistended, nontender at the time of my exam. Surgical dressings removed and all incisions are free of any infection or hernia.. No mass or organomegaly.  Extremities: warm, no deformity Neuro: somnolent, follows some commands Psych: unable to assess due to mental status Skin: warm and dry   CBC Latest Ref Rng & Units 12/06/2017 12/03/2017 12/02/2017  WBC 4.0 - 10.5 K/uL 14.0(H) 9.6 -  Hemoglobin 13.0 - 17.0 g/dL 9.1(L) 8.7(L) 10.5(L)  Hematocrit 39.0 - 52.0 % 29.7(L) 28.4(L) 31.0(L)  Platelets 150 - 400 K/uL 294 264 -    CMP Latest Ref Rng &  Units 12/06/2017 12/03/2017 12/02/2017  Glucose 70 - 99 mg/dL 101(H) 110(H) 94  BUN 8 - 23 mg/dL 54(H) 43(H) -  Creatinine 0.61 - 1.24 mg/dL 10.60(H) 9.94(H) -  Sodium 135 - 145 mmol/L 138 139 140  Potassium 3.5 - 5.1 mmol/L 6.1(H) 4.6 4.1  Chloride 98 - 111 mmol/L 95(L) 97(L) -  CO2 22 - 32 mmol/L 26 27 -  Calcium 8.9 - 10.3 mg/dL 9.8 9.5 -  Total Protein 6.5 - 8.1 g/dL 6.7 - -  Total Bilirubin 0.3 - 1.2 mg/dL 0.9 - -  Alkaline Phos 38 - 126 U/L 49 - -  AST 15 - 41 U/L 26 - -  ALT 0 - 44 U/L 7 - -    Lab Results  Component Value Date   INR 1.77 04/18/2017   INR 1.39 12/29/2014   INR 1.25 12/28/2014    Imaging: Dg Chest 2 View  Result Date: 12/06/2017 CLINICAL DATA:  Generalized weakness and fever and abdominal pain for the past 2 days. EXAM: CHEST - 2 VIEW COMPARISON:  Chest x-ray of September 01, 2017 FINDINGS: The lungs are reasonably well inflated. There is no focal infiltrate. There is no pleural effusion. There is some thickening of the major fissure on the right which is stable. The cardiac silhouette is enlarged. The pulmonary vascularity is not engorged. There is calcification in the wall of the aortic arch. The trachea is midline. The ICD is in stable position. The bony thorax exhibits no acute abnormality. IMPRESSION: There is no active cardiopulmonary disease. Thoracic aortic atherosclerosis. Electronically Signed   By: David  Martinique M.D.   On: 12/06/2017 15:09   Ct Abdomen Pelvis W Contrast  Result Date: 12/06/2017 CLINICAL DATA:  84 year old male with diffuse abdominal pain and constipation for 2 days. Post laparoscopic cholecystectomy 12/02/2017. Initial encounter. EXAM: CT ABDOMEN AND PELVIS WITH CONTRAST TECHNIQUE: Multidetector CT imaging of the abdomen and pelvis was performed using the standard protocol following bolus administration of intravenous contrast. CONTRAST:  150mL OMNIPAQUE IOHEXOL 300 MG/ML  SOLN COMPARISON:  05/15/2016 CT. FINDINGS:  Lower chest: Bibasilar  atelectasis. Cardiomegaly. Sequential pacemaker in place. Aortic and mitral valve calcification. Coronary artery calcification. Hepatobiliary: Post recent cholecystectomy. Minimal amount of fluid within the gallbladder fossa and haziness of surrounding fat planes may be related to recent surgery rather than leak or infection. No worrisome hepatic lesion noted. Pancreas: No pancreatic mass or primary pancreatic inflammation. Spleen: No splenic mass or enlargement. Adrenals/Urinary Tract: No obstructing stone or hydronephrosis. Multiple renal lesions some of which are calcified and complex. Largest lesion is 3.5 cm in left upper pole and relatively similar to prior exam. Hyperplasia adrenal glands. Decompressed urinary bladder with circumferential wall thickening. Stomach/Bowel: Irregular thickening of the pyloric region. Question inflammation or mass. Fluid-filled portions of colon. Moderate stool rectosigmoid region. Taking into account slight limitation by motion degradation, no extraluminal bowel inflammatory process is noted. Specifically, no inflammation surrounds the appendix or small number of diverticula. Vascular/Lymphatic: Atherosclerotic changes aorta and aortic branch vessels with infrarenal 3.1 cm abdominal aortic aneurysm similar to prior exam. No narrowing of vessels but without large vessel occlusion. Scattered normal size lymph nodes. Reproductive: Post prostatic seed implantation. Other: No free air or bowel containing hernia. Mild diastases rectus muscles. Laparoscopic ports noted. Musculoskeletal: Severe degenerative changes L3-4 through L5-S1. Bilateral hip joint degenerative changes. IMPRESSION: 1. Post recent cholecystectomy. Minimal amount of fluid within the gallbladder fossa and haziness of surrounding fat planes may be related to recent surgery rather than leak or infection. However, if there were progressive symptoms, recommend close follow-up imaging to evaluate this region. 2. Irregular  thickening of the pyloric region. Question inflammation or mass. 3. Fluid-filled portions of colon. Moderate stool rectosigmoid region. Taking into account slight limitation by motion degradation, no extraluminal bowel inflammatory process is noted. 4. Multiple renal lesions some of which are calcified and complex. Largest lesion is 3.5 cm in left upper pole and relatively similar to prior exam. 5. Aortic Atherosclerosis (ICD10-I70.0). Stable 3.1 cm abdominal aortic aneurysm. Recommend followup by ultrasound in 3 years. This recommendation follows ACR consensus guidelines: White Paper of the ACR Incidental Findings Committee II on Vascular Findings. J Am Coll Radiol 2013; 24:469-507 6. Post prostatic seed implant. 7. Severe degenerative changes L3-4 through L5-S1. Bilateral hip joint degenerative changes. 8. Bibasilar atelectasis. 9. Cardiomegaly with sequential pacemaker in place. Significant coronary artery calcifications. Electronically Signed   By: Genia Del M.D.   On: 12/06/2017 17:49    A/P: Medically complex patient, 4 days out from laparoscopic cholecystectomy. Underwhelming abdominal exam, CT scan and normal LFTs. I would expect continued mild abdominal pain at this point postoperatively, though perhaps constipation is contributing to that.  Although suspicion for biliary injury or leak is low, would recommend HIDA scan to confirm (ordered) Empiric antibiotics while cultures are pending (zosyn started in ED) Bowel regimen (coloace and dulcolax PR ordered) Nephrology consult for dialysis Pyloric thickening question inflammation or mass- empiric PPI (ordered), consider further future eval with endoscopy  We'll make Dr. Johney Maine aware of patient's readmission in the morning. Will continue to follow.   Romana Juniper, MD [facility] Surgery, [place] Pager [phone]

## 2017-12-06 NOTE — ED Provider Notes (Signed)
Patient resting on repeat exam. Lactic has improved, he remains tachycardic.  I discussed patient's case with his surgical team, and with nephrology. Patient will start antibiotics, with surgical consultation, nephrology consultation, anticipated dialysis tomorrow, possible HIDA scan tomorrow. Patient will be admitted to the medicine team for further evaluation and management.   Carmin Muskrat, MD 12/06/17 [phone]

## 2017-12-07 DIAGNOSIS — G894 Chronic pain syndrome: Secondary | ICD-10-CM | POA: Diagnosis not present

## 2017-12-07 DIAGNOSIS — G8918 Other acute postprocedural pain: Secondary | ICD-10-CM | POA: Diagnosis not present

## 2017-12-07 DIAGNOSIS — N186 End stage renal disease: Secondary | ICD-10-CM | POA: Diagnosis not present

## 2017-12-07 DIAGNOSIS — R1084 Generalized abdominal pain: Secondary | ICD-10-CM | POA: Diagnosis not present

## 2017-12-07 DIAGNOSIS — E1122 Type 2 diabetes mellitus with diabetic chronic kidney disease: Secondary | ICD-10-CM | POA: Diagnosis not present

## 2017-12-07 DIAGNOSIS — I120 Hypertensive chronic kidney disease with stage 5 chronic kidney disease or end stage renal disease: Secondary | ICD-10-CM | POA: Diagnosis not present

## 2017-12-07 DIAGNOSIS — E875 Hyperkalemia: Secondary | ICD-10-CM | POA: Diagnosis not present

## 2017-12-07 LAB — CBC
HCT: 29.9 % — ABNORMAL LOW (ref 39.0–52.0)
Hemoglobin: 9 g/dL — ABNORMAL LOW (ref 13.0–17.0)
MCH: 28 pg (ref 26.0–34.0)
MCHC: 30.1 g/dL (ref 30.0–36.0)
MCV: 92.9 fL (ref 80.0–100.0)
PLATELETS: 247 10*3/uL (ref 150–400)
RBC: 3.22 MIL/uL — ABNORMAL LOW (ref 4.22–5.81)
RDW: 17.1 % — ABNORMAL HIGH (ref 11.5–15.5)
WBC: 13.7 10*3/uL — AB (ref 4.0–10.5)

## 2017-12-07 LAB — RENAL FUNCTION PANEL
ALBUMIN: 2.6 g/dL — AB (ref 3.5–5.0)
Anion gap: 15 (ref 5–15)
BUN: 58 mg/dL — ABNORMAL HIGH (ref 8–23)
CALCIUM: 9.2 mg/dL (ref 8.9–10.3)
CO2: 26 mmol/L (ref 22–32)
CREATININE: 11.14 mg/dL — AB (ref 0.61–1.24)
Chloride: 96 mmol/L — ABNORMAL LOW (ref 98–111)
GFR calc Af Amer: 4 mL/min — ABNORMAL LOW (ref >60)
GFR calc non Af Amer: 4 mL/min — ABNORMAL LOW (ref >60)
Glucose, Bld: 83 mg/dL (ref 70–99)
PHOSPHORUS: 6.2 mg/dL — AB (ref 2.5–4.6)
Potassium: 6.4 mmol/L (ref 3.5–5.1)
SODIUM: 137 mmol/L (ref 135–145)

## 2017-12-07 LAB — HEPATIC FUNCTION PANEL
ALK PHOS: 46 U/L (ref 38–126)
ALT: 6 U/L (ref 0–44)
AST: 17 U/L (ref 15–41)
Albumin: 2.7 g/dL — ABNORMAL LOW (ref 3.5–5.0)
Bilirubin, Direct: <0.1 mg/dL (ref 0.0–0.2)
TOTAL PROTEIN: 5.8 g/dL — AB (ref 6.5–8.1)
Total Bilirubin: 0.6 mg/dL (ref 0.3–1.2)

## 2017-12-07 LAB — GLUCOSE, CAPILLARY: Glucose-Capillary: 108 mg/dL — ABNORMAL HIGH (ref 70–99)

## 2017-12-07 LAB — MRSA PCR SCREENING: MRSA by PCR: NEGATIVE

## 2017-12-07 MED ORDER — LIP MEDEX EX OINT
1.0000 "application " | TOPICAL_OINTMENT | Freq: Two times a day (BID) | CUTANEOUS | Status: DC
  Administered 2017-12-07 – 2017-12-08 (×3): 1 via TOPICAL
  Filled 2017-12-07: qty 7
  Filled 2017-12-07: qty 10

## 2017-12-07 MED ORDER — SODIUM CHLORIDE 0.9 % IV SOLN: 100.0000 mL | INTRAVENOUS | Status: DC | PRN

## 2017-12-07 MED ORDER — MAGIC MOUTHWASH: 15.0000 mL | Freq: Four times a day (QID) | ORAL | Status: DC | PRN

## 2017-12-07 MED ORDER — TRAMADOL HCL 50 MG PO TABS: 50.0000 mg | ORAL_TABLET | Freq: Four times a day (QID) | ORAL | Status: DC | PRN

## 2017-12-07 MED ORDER — PENTAFLUOROPROP-TETRAFLUOROETH EX AERO: 1.0000 "application " | INHALATION_SPRAY | CUTANEOUS | Status: DC | PRN

## 2017-12-07 MED ORDER — ACETAMINOPHEN 650 MG RE SUPP: 650.0000 mg | Freq: Four times a day (QID) | RECTAL | Status: DC | PRN

## 2017-12-07 MED ORDER — LIDOCAINE-PRILOCAINE 2.5-2.5 % EX CREA: 1.0000 "application " | TOPICAL_CREAM | CUTANEOUS | Status: DC | PRN

## 2017-12-07 MED ORDER — METHOCARBAMOL 1000 MG/10ML IJ SOLN
1000.0000 mg | Freq: Four times a day (QID) | INTRAVENOUS | Status: DC | PRN
  Filled 2017-12-07: qty 10

## 2017-12-07 MED ORDER — GUAIFENESIN-DM 100-10 MG/5ML PO SYRP: 15.0000 mL | ORAL_SOLUTION | ORAL | Status: DC | PRN

## 2017-12-07 MED ORDER — ENSURE SURGERY PO LIQD
237.0000 mL | Freq: Two times a day (BID) | ORAL | Status: DC
  Administered 2017-12-07 (×2): 237 mL via ORAL
  Filled 2017-12-07 (×4): qty 237

## 2017-12-07 MED ORDER — HEPARIN SODIUM (PORCINE) 1000 UNIT/ML DIALYSIS: 1000.0000 [IU] | INTRAMUSCULAR | Status: DC | PRN

## 2017-12-07 MED ORDER — PIPERACILLIN-TAZOBACTAM 3.375 G IVPB
3.3750 g | Freq: Two times a day (BID) | INTRAVENOUS | Status: DC
  Filled 2017-12-07: qty 50

## 2017-12-07 MED ORDER — PROCHLORPERAZINE EDISYLATE 10 MG/2ML IJ SOLN: 5.0000 mg | INTRAMUSCULAR | Status: DC | PRN

## 2017-12-07 MED ORDER — SODIUM CHLORIDE 0.9 % IV SOLN
1.0000 g | Freq: Once | INTRAVENOUS | Status: AC
  Administered 2017-12-07: 1 g via INTRAVENOUS
  Filled 2017-12-07: qty 10

## 2017-12-07 MED ORDER — PHENOL 1.4 % MT LIQD: 2.0000 | OROMUCOSAL | Status: DC | PRN

## 2017-12-07 MED ORDER — SIMETHICONE 40 MG/0.6ML PO SUSP
40.0000 mg | Freq: Four times a day (QID) | ORAL | Status: DC | PRN
  Filled 2017-12-07: qty 0.6

## 2017-12-07 MED ORDER — ALTEPLASE 2 MG IJ SOLR: 2.0000 mg | Freq: Once | INTRAMUSCULAR | Status: DC | PRN

## 2017-12-07 MED ORDER — PSYLLIUM 95 % PO PACK
1.0000 | PACK | Freq: Two times a day (BID) | ORAL | Status: DC
  Administered 2017-12-07 – 2017-12-08 (×2): 1 via ORAL
  Filled 2017-12-07 (×2): qty 1

## 2017-12-07 MED ORDER — MENTHOL 3 MG MT LOZG: 1.0000 | LOZENGE | OROMUCOSAL | Status: DC | PRN

## 2017-12-07 MED ORDER — METHOCARBAMOL 500 MG PO TABS: 1000.0000 mg | ORAL_TABLET | Freq: Four times a day (QID) | ORAL | Status: DC | PRN

## 2017-12-07 MED ORDER — ACETAMINOPHEN 325 MG PO TABS: 325.0000 mg | ORAL_TABLET | Freq: Four times a day (QID) | ORAL | Status: DC | PRN

## 2017-12-07 MED ORDER — LIDOCAINE HCL (PF) 1 % IJ SOLN: 5.0000 mL | INTRAMUSCULAR | Status: DC | PRN

## 2017-12-07 NOTE — Progress Notes (Signed)
Sean Best [id number] 1934-01-03  CARE TEAM:  PCP: Seward Carol, MD  Outpatient Care Team: Patient Care Team: Seward Carol, MD as PCP - General (Internal Medicine) Evans Lance, MD as PCP - Electrophysiology (Cardiology) Evans Lance, MD as PCP - Cardiology (Cardiology) Clent Jacks, MD (Ophthalmology) Elmarie Shiley, MD as Consulting Physician (Nephrology) Michael Boston, MD as Consulting Physician (General Surgery)  Inpatient Treatment Team: Treatment Team: Attending Provider: Domenic Polite, MD; Consulting Physician: Nolon Nations, MD; Consulting Physician: Michael Boston, MD; Consulting Physician: Mauricia Area, MD; Rounding Team: Trenton Gammon, MD   Problem List:   Principal Problem:   Abdominal pain Active Problems:   ESRD on dialysis [facility])   Hyperkalemia   Anemia   HTN (hypertension)   Leukocytosis   Gout   Chronic pain   SURGERY 12/02/2017  POST-OPERATIVE DIAGNOSIS:   Acute on Chronic Calculus Cholecystitis   PROCEDURE:  Laparoscopic cholecystectomy  SURGEON:  Adin Hector, MD, FACS.   Hospital Stay = 1 days  Assessment  Constipation resolved  HyperK s/p HD yesterday  Plan:  -With pain resolved, cancel HIDA scan - no evid of peritonitis, transaminitis. -solid diet -bowel regimen -VTE prophylaxis- SCDs, etc -mobilize as tolerated to help recovery -have pt keep appt to f/u with me   20 minutes spent in review, evaluation, examination, counseling, and coordination of care.  More than 50% of that time was spent in counseling.  12/07/2017    Subjective: (Chief complaint)  Had BMs & feels MUCH better HD yesterday Much less jittery Walking in room Daughter at bedside  Objective:  Vital signs:  Vitals:   12/06/17 2055 12/07/17 0036 12/07/17 0618 12/07/17 0744  BP: (!) 155/102 (!) 146/89 (!) 165/87 (!) 160/89  Pulse: 98 89 99 95  Resp: '18 20 18 18  ' Temp: 98.9 F (37.2 C)  98.6 F (37 C) 98.2 F (36.8 C)  TempSrc:    Oral Oral  SpO2: 94%  91% 96%  Weight:      Height:        Last BM Date: 12/06/17  Intake/Output   Yesterday:  10/07 0701 - 10/08 0700 In: 157.1 [IV Piggyback:157.1] Out: 0  This shift:  No intake/output data recorded.  Bowel function:  Flatus: YES  BM:  YES  Drain: (No drain)   Physical Exam:  General: Pt awake/alert/oriented x4 in no acute distress.  Smiling, jovial Eyes: PERRL, normal EOM.  Sclera clear.  No icterus Neuro: CN II-XII intact w/o focal sensory/motor deficits. Lymph: No head/neck/groin lymphadenopathy Psych:  No delerium/psychosis/paranoia HENT: Normocephalic, Mucus membranes moist.  No thrush Neck: Supple, No tracheal deviation Chest: No chest wall pain w good excursion CV:  Pulses intact.  Regular rhythm MS: Normal AROM mjr joints.  No obvious deformity  Abdomen: Soft.  Nondistended.  Nontender.  No evidence of peritonitis.  Incisions c/d/i.  No guarding.  No incarcerated hernias.  Ext:  No deformity.  No mjr edema.  No cyanosis.  Left forearm AV fistula w palpable trill Skin: No petechiae / purpura  Results:   Labs: Results for orders placed or performed during the hospital encounter of 12/06/17 (from the past 48 hour(s))  CBC with Differential     Status: Abnormal   Collection Time: 12/06/17  2:19 PM  Result Value Ref Range   WBC 14.0 (H) 4.0 - 10.5 K/uL   RBC 3.19 (L) 4.22 - 5.81 MIL/uL   Hemoglobin 9.1 (L) 13.0 - 17.0 g/dL   HCT 29.7 (L) 39.0 -  52.0 %   MCV 93.1 78.0 - 100.0 fL   MCH 28.5 26.0 - 34.0 pg   MCHC 30.6 30.0 - 36.0 g/dL   RDW 17.2 (H) 11.5 - 15.5 %   Platelets 294 150 - 400 K/uL   Neutrophils Relative % 82 %   Neutro Abs 11.5 (H) 1.7 - 7.7 K/uL   Lymphocytes Relative 6 %   Lymphs Abs 0.9 0.7 - 4.0 K/uL   Monocytes Relative 10 %   Monocytes Absolute 1.4 (H) 0.1 - 1.0 K/uL   Eosinophils Relative 1 %   Eosinophils Absolute 0.1 0.0 - 0.7 K/uL   Basophils Relative 0 %   Basophils Absolute 0.1 0.0 - 0.1 K/uL   Immature  Granulocytes 1 %   Abs Immature Granulocytes 0.1 0.0 - 0.1 K/uL    Comment: Performed at [facility] [address], [place], [place] [address]  Comprehensive metabolic panel     Status: Abnormal   Collection Time: 12/06/17  2:19 PM  Result Value Ref Range   Sodium 138 135 - 145 mmol/L   Potassium 6.1 (H) 3.5 - 5.1 mmol/L   Chloride 95 (L) 98 - 111 mmol/L   CO2 26 22 - 32 mmol/L   Glucose, Bld 101 (H) 70 - 99 mg/dL   BUN 54 (H) 8 - 23 mg/dL   Creatinine, Ser 10.60 (H) 0.61 - 1.24 mg/dL   Calcium 9.8 8.9 - 10.3 mg/dL   Total Protein 6.7 6.5 - 8.1 g/dL   Albumin 3.3 (L) 3.5 - 5.0 g/dL   AST 26 15 - 41 U/L   ALT 7 0 - 44 U/L   Alkaline Phosphatase 49 38 - 126 U/L   Total Bilirubin 0.9 0.3 - 1.2 mg/dL   GFR calc non Af Amer 4 (L) >60 mL/min   GFR calc Af Amer 5 (L) >60 mL/min    Comment: (NOTE) The eGFR has been calculated using the CKD EPI equation. This calculation has not been validated in all clinical situations. eGFR's persistently <60 mL/min signify possible Chronic Kidney Disease.    Anion gap 17 (H) 5 - 15    Comment: Performed at [facility], [place] [address] [place], [place] [address]  Lipase, blood     Status: None   Collection Time: 12/06/17  2:19 PM  Result Value Ref Range   Lipase 34 11 - 51 U/L    Comment: Performed at [facility] [address] [place], [place] [address]  I-Stat CG4 Lactic Acid, ED     Status: Abnormal   Collection Time: 12/06/17  2:41 PM  Result Value Ref Range   Lactic Acid, Venous 2.29 (HH) 0.5 - 1.9 mmol/L   Comment NOTIFIED PHYSICIAN   I-Stat CG4 Lactic Acid, ED     Status: Abnormal   Collection Time: 12/06/17  4:57 PM  Result Value Ref Range   Lactic Acid, Venous 2.20 (HH) 0.5 - 1.9 mmol/L   Comment NOTIFIED PHYSICIAN   Glucose, capillary     Status: None   Collection Time: 12/06/17  8:54 PM  Result Value Ref Range   Glucose-Capillary 87 70 - 99 mg/dL  Potassium     Status: Abnormal   Collection Time: 12/06/17   9:04 PM  Result Value Ref Range   Potassium 5.6 (H) 3.5 - 5.1 mmol/L    Comment: Performed at [facility], [place] [address] [place], [place] [address]  MRSA PCR Screening  Status: None   Collection Time: 12/06/17 10:26 PM  Result Value Ref Range   MRSA by PCR NEGATIVE NEGATIVE    Comment:        The GeneXpert MRSA Assay (FDA approved for NASAL specimens only), is one component of a comprehensive MRSA colonization surveillance program. It is not intended to diagnose MRSA infection nor to guide or monitor treatment for MRSA infections. Performed at [facility], [place] [address], [place], [place] [address]   CBC     Status: Abnormal   Collection Time: 12/07/17  5:22 AM  Result Value Ref Range   WBC 13.7 (H) 4.0 - 10.5 K/uL   RBC 3.22 (L) 4.22 - 5.81 MIL/uL   Hemoglobin 9.0 (L) 13.0 - 17.0 g/dL   HCT 29.9 (L) 39.0 - 52.0 %   MCV 92.9 80.0 - 100.0 fL   MCH 28.0 26.0 - 34.0 pg   MCHC 30.1 30.0 - 36.0 g/dL   RDW 17.1 (H) 11.5 - 15.5 %   Platelets 247 150 - 400 K/uL    Comment: Performed at [facility] [address], [place], [place] [address]  Renal function panel     Status: Abnormal   Collection Time: 12/07/17  5:22 AM  Result Value Ref Range   Sodium 137 135 - 145 mmol/L   Potassium 6.4 (HH) 3.5 - 5.1 mmol/L    Comment: CRITICAL RESULT CALLED TO, READ BACK BY AND VERIFIED WITH: Wheeler,N RN 12/07/2017 0641 JORDANS NO VISIBLE HEMOLYSIS    Chloride 96 (L) 98 - 111 mmol/L   CO2 26 22 - 32 mmol/L   Glucose, Bld 83 70 - 99 mg/dL   BUN 58 (H) 8 - 23 mg/dL   Creatinine, Ser 11.14 (H) 0.61 - 1.24 mg/dL   Calcium 9.2 8.9 - 10.3 mg/dL   Phosphorus 6.2 (H) 2.5 - 4.6 mg/dL   Albumin 2.6 (L) 3.5 - 5.0 g/dL   GFR calc non Af Amer 4 (L) >60 mL/min   GFR calc Af Amer 4 (L) >60 mL/min    Comment: (NOTE) The eGFR has been calculated using the CKD EPI equation. This calculation has not been validated in all clinical situations. eGFR's persistently <60 mL/min  signify possible Chronic Kidney Disease.    Anion gap 15 5 - 15    Comment: Performed at [facility] [address] [place], [place] [address]  Hepatic function panel     Status: Abnormal   Collection Time: 12/07/17  5:22 AM  Result Value Ref Range   Total Protein 5.8 (L) 6.5 - 8.1 g/dL   Albumin 2.7 (L) 3.5 - 5.0 g/dL   AST 17 15 - 41 U/L   ALT 6 0 - 44 U/L   Alkaline Phosphatase 46 38 - 126 U/L   Total Bilirubin 0.6 0.3 - 1.2 mg/dL   Bilirubin, Direct <0.1 0.0 - 0.2 mg/dL   Indirect Bilirubin NOT CALCULATED 0.3 - 0.9 mg/dL    Comment: Performed at [facility] [address], [place], [place] [address]    Imaging / Studies: Dg Chest 2 View  Result Date: 12/06/2017 CLINICAL DATA:  Generalized weakness and fever and abdominal pain for the past 2 days. EXAM: CHEST - 2 VIEW COMPARISON:  Chest x-ray of September 01, 2017 FINDINGS: The lungs are reasonably well inflated. There is no focal infiltrate. There is no pleural effusion. There is some thickening of the major fissure on the right which is stable. The  cardiac silhouette is enlarged. The pulmonary vascularity is not engorged. There is calcification in the wall of the aortic arch. The trachea is midline. The ICD is in stable position. The bony thorax exhibits no acute abnormality. IMPRESSION: There is no active cardiopulmonary disease. Thoracic aortic atherosclerosis. Electronically Signed   By: David  Martinique M.D.   On: 12/06/2017 15:09   Ct Abdomen Pelvis W Contrast  Result Date: 12/06/2017 CLINICAL DATA:  84 year old male with diffuse abdominal pain and constipation for 2 days. Post laparoscopic cholecystectomy 12/02/2017. Initial encounter. EXAM: CT ABDOMEN AND PELVIS WITH CONTRAST TECHNIQUE: Multidetector CT imaging of the abdomen and pelvis was performed using the standard protocol following bolus administration of intravenous contrast. CONTRAST:  126m OMNIPAQUE IOHEXOL 300 MG/ML  SOLN COMPARISON:  05/15/2016 CT. FINDINGS:  Lower chest: Bibasilar atelectasis. Cardiomegaly. Sequential pacemaker in place. Aortic and mitral valve calcification. Coronary artery calcification. Hepatobiliary: Post recent cholecystectomy. Minimal amount of fluid within the gallbladder fossa and haziness of surrounding fat planes may be related to recent surgery rather than leak or infection. No worrisome hepatic lesion noted. Pancreas: No pancreatic mass or primary pancreatic inflammation. Spleen: No splenic mass or enlargement. Adrenals/Urinary Tract: No obstructing stone or hydronephrosis. Multiple renal lesions some of which are calcified and complex. Largest lesion is 3.5 cm in left upper pole and relatively similar to prior exam. Hyperplasia adrenal glands. Decompressed urinary bladder with circumferential wall thickening. Stomach/Bowel: Irregular thickening of the pyloric region. Question inflammation or mass. Fluid-filled portions of colon. Moderate stool rectosigmoid region. Taking into account slight limitation by motion degradation, no extraluminal bowel inflammatory process is noted. Specifically, no inflammation surrounds the appendix or small number of diverticula. Vascular/Lymphatic: Atherosclerotic changes aorta and aortic branch vessels with infrarenal 3.1 cm abdominal aortic aneurysm similar to prior exam. No narrowing of vessels but without large vessel occlusion. Scattered normal size lymph nodes. Reproductive: Post prostatic seed implantation. Other: No free air or bowel containing hernia. Mild diastases rectus muscles. Laparoscopic ports noted. Musculoskeletal: Severe degenerative changes L3-4 through L5-S1. Bilateral hip joint degenerative changes. IMPRESSION: 1. Post recent cholecystectomy. Minimal amount of fluid within the gallbladder fossa and haziness of surrounding fat planes may be related to recent surgery rather than leak or infection. However, if there were progressive symptoms, recommend close follow-up imaging to evaluate  this region. 2. Irregular thickening of the pyloric region. Question inflammation or mass. 3. Fluid-filled portions of colon. Moderate stool rectosigmoid region. Taking into account slight limitation by motion degradation, no extraluminal bowel inflammatory process is noted. 4. Multiple renal lesions some of which are calcified and complex. Largest lesion is 3.5 cm in left upper pole and relatively similar to prior exam. 5. Aortic Atherosclerosis (ICD10-I70.0). Stable 3.1 cm abdominal aortic aneurysm. Recommend followup by ultrasound in 3 years. This recommendation follows ACR consensus guidelines: White Paper of the ACR Incidental Findings Committee II on Vascular Findings. J Am Coll Radiol 2013; [id number]. Post prostatic seed implant. 7. Severe degenerative changes L3-4 through L5-S1. Bilateral hip joint degenerative changes. 8. Bibasilar atelectasis. 9. Cardiomegaly with sequential pacemaker in place. Significant coronary artery calcifications. Electronically Signed   By: SGenia DelM.D.   On: 12/06/2017 17:49    Medications / Allergies: per chart  Antibiotics: Anti-infectives (From admission, onward)   Start     Dose/Rate Route Frequency Ordered Stop   12/07/17 1000  piperacillin-tazobactam (ZOSYN) IVPB 3.375 g     3.375 g 12.5 mL/hr over 240 Minutes Intravenous Every 12 hours 12/07/17 0012  12/06/17 1830  piperacillin-tazobactam (ZOSYN) IVPB 3.375 g     3.375 g 100 mL/hr over 30 Minutes Intravenous  Once 12/06/17 1820 12/06/17 1859        Note: Portions of this report may have been transcribed using voice recognition software. Every effort was made to ensure accuracy; however, inadvertent computerized transcription errors may be present.   Any transcriptional errors that result from this process are unintentional.     Adin Hector, MD, FACS, MASCRS Gastrointestinal and Minimally Invasive Surgery    [address] [place], [place] [address] [phone]  Main / Paging [phone] Fax

## 2017-12-07 NOTE — Progress Notes (Signed)
New Admission Note:  Arrival Method: Stretcher Mental Orientation: Alert and oriented x 3-4 Telemetry: Box 18 Assessment: Completed Skin: Warm and dry IV: NSL Pain: Senies Tubes: None Safety Measures: Safety Fall Prevention Plan initiated.  Admission: Completed 5 M  Orientation: Patient has been orientated to the room, unit and the staff. Welcome booklet given.  Family: None  Orders have been reviewed and implemented. Will continue to monitor the patient. Call light has been placed within reach and bed alarm has been activated.   Sima Matas BSN, RN  Phone Number: [phone]

## 2017-12-07 NOTE — Progress Notes (Signed)
PROGRESS NOTE    Sean Best  VOH:[id number] DOB: 1933-12-22 DOA: 12/06/2017 PCP: Seward Carol, MD  Brief Narrative: 84 year old male with ESRD on hemodialysis Monday Wednesday Friday, type 2 diabetes, CVA, permanent pacemaker, hypertension, recent laparoscopic cholecystectomy for acute on chronic cholecystitis on 10/3, subsequently discharged home admitted with worsening abdominal pain since 10/6. -CT scan of the emergency room last night which showed minimal fluid in the gallbladder fossa haziness of surrounding fat planes and irregular thickening of pyloric region, fluid-filled parts of colon   Assessment & Plan:   Principal Problem:   Abdominal pain -Suspected to be secondary to postop pain and constipation -Clinically no evidence of bile leak -GI general surgery consult appreciated -Symptoms much improved after laxatives and BM -Advance diet as tolerated  Irregular thickening of pyloric region -No symptoms of dysphagia or odynophagia -Advised pt to FU with gastroenterology in few weeks for elective endoscopy  ESRD on hemodialysis -missed Last dialysis on Monday due to symptoms  Hyperkalemia -Dialysis today, given calcium gluconate IV  Leukocytosis -Mild, suspected to be reactive  -Appreciate general surgery consult, not felt to have postop complications are biliary leak -Stopped antibiotics, monitor  Hypertension -BP elevated due to missed dialysis -Continue lisinopril and amlodipine  Type 2 diabetes mellitus -Last hemoglobin A1c was 5.6, not on home medications, monitor  Chronic pain -Continue home regimen of tramadol and gabapentin  DVT prophylaxis: Subcutaneous heparin Code Status: Full code  family Communication: no Family at bedside Disposition Plan: Home later today if stable post dialysis and tolerating diet  Consultants:   Nephrology  Surgery   Procedures:   Antimicrobials:    Subjective: -Better after having multiple bowel movements last  night, no nausea vomiting -Tolerating regular diet  Objective: Vitals:   12/06/17 2055 12/07/17 0036 12/07/17 0618 12/07/17 0744  BP: (!) 155/102 (!) 146/89 (!) 165/87 (!) 160/89  Pulse: 98 89 99 95  Resp: 18 20 18 18   Temp: 98.9 F (37.2 C)  98.6 F (37 C) 98.2 F (36.8 C)  TempSrc:   Oral Oral  SpO2: 94%  91% 96%  Weight:      Height:        Intake/Output Summary (Last 24 hours) at 12/07/2017 1056 Last data filed at 12/07/2017 0955 Gross per 24 hour  Intake 277.06 ml  Output 0 ml  Net 277.06 ml   Filed Weights   12/06/17 1400  Weight: 89.4 kg    Examination:  General exam: Elderly, chronically ill male sitting up in bed, no distress, AAO x3 Respiratory system: Fine basilar crackles  cardiovascular system: S1 & S2 heard, RRR  Gastrointestinal system: Abdomen is nondistended, soft, no right upper quadrant tenderness, surgical scars from recent lap chole,.Normal bowel sounds heard. Central nervous system: Alert and oriented. No focal neurological deficits. Extremities: Ace edema Skin: No rashes, lesions or ulcers Psychiatry: Judgement and insight appear normal. Mood & affect appropriate.     Data Reviewed:   CBC: Recent Labs  Lab 12/02/17 0616 12/03/17 0843 12/06/17 1419 12/07/17 0522  WBC  --  9.6 14.0* 13.7*  NEUTROABS  --   --  11.5*  --   HGB 10.5* 8.7* 9.1* 9.0*  HCT 31.0* 28.4* 29.7* 29.9*  MCV  --  93.4 93.1 92.9  PLT  --  264 294 694   Basic Metabolic Panel: Recent Labs  Lab 12/02/17 0616 12/03/17 0843 12/06/17 1419 12/06/17 2104 12/07/17 0522  NA 140 139 138  --  137  K 4.1 4.6 6.1* 5.6*  6.4*  CL  --  97* 95*  --  96*  CO2  --  27 26  --  26  GLUCOSE 94 110* 101*  --  83  BUN  --  43* 54*  --  58*  CREATININE  --  9.94* 10.60*  --  11.14*  CALCIUM  --  9.5 9.8  --  9.2  PHOS  --  9.1*  --   --  6.2*   GFR: Estimated Creatinine Clearance: 5.3 mL/min (A) (by C-G formula based on SCr of 11.14 mg/dL (H)). Liver Function Tests: Recent  Labs  Lab 12/03/17 0843 12/06/17 1419 12/07/17 0522  AST  --  26 17  ALT  --  7 6  ALKPHOS  --  49 46  BILITOT  --  0.9 0.6  PROT  --  6.7 5.8*  ALBUMIN 3.0* 3.3* 2.7*  2.6*   Recent Labs  Lab 12/06/17 1419  LIPASE 34   No results for input(s): AMMONIA in the last 168 hours. Coagulation Profile: No results for input(s): INR, PROTIME in the last 168 hours. Cardiac Enzymes: No results for input(s): CKTOTAL, CKMB, CKMBINDEX, TROPONINI in the last 168 hours. BNP (last 3 results) No results for input(s): PROBNP in the last 8760 hours. HbA1C: No results for input(s): HGBA1C in the last 72 hours. CBG: Recent Labs  Lab 12/02/17 0602 12/02/17 0929 12/06/17 2054  GLUCAP 93 120* 87   Lipid Profile: No results for input(s): CHOL, HDL, LDLCALC, TRIG, CHOLHDL, LDLDIRECT in the last 72 hours. Thyroid Function Tests: No results for input(s): TSH, T4TOTAL, FREET4, T3FREE, THYROIDAB in the last 72 hours. Anemia Panel: No results for input(s): VITAMINB12, FOLATE, FERRITIN, TIBC, IRON, RETICCTPCT in the last 72 hours. Urine analysis:    Component Value Date/Time   COLORURINE YELLOW 12/29/2014 0550   APPEARANCEUR CLOUDY (A) 12/29/2014 0550   LABSPEC 1.020 12/29/2014 0550   PHURINE 5.0 12/29/2014 0550   GLUCOSEU NEGATIVE 12/29/2014 0550   HGBUR SMALL (A) 12/29/2014 0550   BILIRUBINUR NEGATIVE 12/29/2014 0550   KETONESUR NEGATIVE 12/29/2014 0550   PROTEINUR 100 (A) 12/29/2014 0550   UROBILINOGEN 0.2 12/29/2014 0550   NITRITE NEGATIVE 12/29/2014 0550   LEUKOCYTESUR SMALL (A) 12/29/2014 0550   Sepsis Labs: @LABRCNTIP (procalcitonin:4,lacticidven:4)  ) Recent Results (from the past 240 hour(s))  Blood culture (routine x 2)     Status: None (Preliminary result)   Collection Time: 12/06/17  2:20 PM  Result Value Ref Range Status   Specimen Description BLOOD LEFT ANTECUBITAL  Final   Special Requests   Final    BOTTLES DRAWN AEROBIC AND ANAEROBIC Blood Culture adequate volume    Culture   Final    NO GROWTH < 24 HOURS Performed at [facility], [place] [address] [place], [place] [address]    Report Status PENDING  Incomplete  Blood culture (routine x 2)     Status: None (Preliminary result)   Collection Time: 12/06/17  2:47 PM  Result Value Ref Range Status   Specimen Description BLOOD RIGHT HAND  Final   Special Requests   Final    BOTTLES DRAWN AEROBIC AND ANAEROBIC Blood Culture adequate volume   Culture   Final    NO GROWTH < 24 HOURS Performed at [facility], [place] [address] [place], [place] [address]    Report Status PENDING  Incomplete  MRSA PCR Screening     Status: None   Collection Time: 12/06/17 10:26 PM  Result Value Ref  Range Status   MRSA by PCR NEGATIVE NEGATIVE Final    Comment:        The GeneXpert MRSA Assay (FDA approved for NASAL specimens only), is one component of a comprehensive MRSA colonization surveillance program. It is not intended to diagnose MRSA infection nor to guide or monitor treatment for MRSA infections. Performed at [facility], [place] [address] [place], [place] [address]          Radiology Studies: Dg Chest 2 View  Result Date: 12/06/2017 CLINICAL DATA:  Generalized weakness and fever and abdominal pain for the past 2 days. EXAM: CHEST - 2 VIEW COMPARISON:  Chest x-ray of September 01, 2017 FINDINGS: The lungs are reasonably well inflated. There is no focal infiltrate. There is no pleural effusion. There is some thickening of the major fissure on the right which is stable. The cardiac silhouette is enlarged. The pulmonary vascularity is not engorged. There is calcification in the wall of the aortic arch. The trachea is midline. The ICD is in stable position. The bony thorax exhibits no acute abnormality. IMPRESSION: There is no active cardiopulmonary disease. Thoracic aortic atherosclerosis. Electronically Signed   By: David  Martinique M.D.   On: 12/06/2017 15:09   Ct Abdomen Pelvis W Contrast  Result  Date: 12/06/2017 CLINICAL DATA:  84 year old male with diffuse abdominal pain and constipation for 2 days. Post laparoscopic cholecystectomy 12/02/2017. Initial encounter. EXAM: CT ABDOMEN AND PELVIS WITH CONTRAST TECHNIQUE: Multidetector CT imaging of the abdomen and pelvis was performed using the standard protocol following bolus administration of intravenous contrast. CONTRAST:  172mL OMNIPAQUE IOHEXOL 300 MG/ML  SOLN COMPARISON:  05/15/2016 CT. FINDINGS: Lower chest: Bibasilar atelectasis. Cardiomegaly. Sequential pacemaker in place. Aortic and mitral valve calcification. Coronary artery calcification. Hepatobiliary: Post recent cholecystectomy. Minimal amount of fluid within the gallbladder fossa and haziness of surrounding fat planes may be related to recent surgery rather than leak or infection. No worrisome hepatic lesion noted. Pancreas: No pancreatic mass or primary pancreatic inflammation. Spleen: No splenic mass or enlargement. Adrenals/Urinary Tract: No obstructing stone or hydronephrosis. Multiple renal lesions some of which are calcified and complex. Largest lesion is 3.5 cm in left upper pole and relatively similar to prior exam. Hyperplasia adrenal glands. Decompressed urinary bladder with circumferential wall thickening. Stomach/Bowel: Irregular thickening of the pyloric region. Question inflammation or mass. Fluid-filled portions of colon. Moderate stool rectosigmoid region. Taking into account slight limitation by motion degradation, no extraluminal bowel inflammatory process is noted. Specifically, no inflammation surrounds the appendix or small number of diverticula. Vascular/Lymphatic: Atherosclerotic changes aorta and aortic branch vessels with infrarenal 3.1 cm abdominal aortic aneurysm similar to prior exam. No narrowing of vessels but without large vessel occlusion. Scattered normal size lymph nodes. Reproductive: Post prostatic seed implantation. Other: No free air or bowel containing  hernia. Mild diastases rectus muscles. Laparoscopic ports noted. Musculoskeletal: Severe degenerative changes L3-4 through L5-S1. Bilateral hip joint degenerative changes. IMPRESSION: 1. Post recent cholecystectomy. Minimal amount of fluid within the gallbladder fossa and haziness of surrounding fat planes may be related to recent surgery rather than leak or infection. However, if there were progressive symptoms, recommend close follow-up imaging to evaluate this region. 2. Irregular thickening of the pyloric region. Question inflammation or mass. 3. Fluid-filled portions of colon. Moderate stool rectosigmoid region. Taking into account slight limitation by motion degradation, no extraluminal bowel inflammatory process is noted. 4. Multiple renal lesions some of which are calcified and complex. Largest lesion is 3.5 cm in left  upper pole and relatively similar to prior exam. 5. Aortic Atherosclerosis (ICD10-I70.0). Stable 3.1 cm abdominal aortic aneurysm. Recommend followup by ultrasound in 3 years. This recommendation follows ACR consensus guidelines: White Paper of the ACR Incidental Findings Committee II on Vascular Findings. J Am Coll Radiol 2013; 35:361-443 6. Post prostatic seed implant. 7. Severe degenerative changes L3-4 through L5-S1. Bilateral hip joint degenerative changes. 8. Bibasilar atelectasis. 9. Cardiomegaly with sequential pacemaker in place. Significant coronary artery calcifications. Electronically Signed   By: Genia Del M.D.   On: 12/06/2017 17:49        Scheduled Meds: . allopurinol  100 mg Oral Daily  . amLODipine  10 mg Oral QHS  . atorvastatin  40 mg Oral Daily  . calcium acetate  2,001 mg Oral TID WC   And  . calcium acetate  1,334 mg Oral With snacks  . calcium carbonate  3,000 mg Oral BID  . Chlorhexidine Gluconate Cloth  6 each Topical Q0600  . cholecalciferol  1,000 Units Oral Daily  . docusate sodium  100 mg Oral BID  . feeding supplement  237 mL Oral BID BM  .  gabapentin  100 mg Oral QHS  . heparin  5,000 Units Subcutaneous Q8H  . lip balm  1 application Topical BID  . lisinopril  10 mg Oral QHS  . multivitamin  1 tablet Oral Daily  . pantoprazole  40 mg Oral BID  . psyllium  1 packet Oral BID  . sevelamer carbonate  7.2 g Oral TID WC   Continuous Infusions: . calcium gluconate 1 GM IVPB 1 g (12/07/17 1041)  . methocarbamol (ROBAXIN) IV    . piperacillin-tazobactam (ZOSYN)  IV       LOS: 1 day    Time spent: 72min    Domenic Polite, MD [facility] Page via [email or web address], password [email or web address] After 7PM please contact night-coverage  12/07/2017, 10:56 AM

## 2017-12-07 NOTE — Consult Note (Signed)
Renal Service Consult Note [facility]  Burt Piatek 12/07/2017 Sol Blazing Requesting Physician:  Dr Broadus John  Reason for Consult:  ESRD pt w/  HPI: The patient is a 84 y.o. year-old with hx of ESRD, HTN, prostate cancer sp recent lap chole on 10/3 was dc'd home 1-2 days ago and returned yeseterday w/ abd pain.  Did not have a BM over the weekend.  No bloody stool, no emesis.  Pt was admitted.  Since admission he had a BM and is feeling better.  No CP, no leg swelling and no fevers.   Patient had HD Friday but not yesterday.  Pt is on HD since around 2014, "my kidneys just gave out!".  Pt has good compliance w/ HD.  Says he had his AVF flushed out about 1 wk ago.     ROS  denies CP  no joint pain   no HA  no blurry vision  no rash  no diarrhea  no nausea/ vomiting   Past Medical History  Past Medical History:  Diagnosis Date  . Anemia   . Arthritis    "knees" (04/11/2016)  . Blind left eye    S/P trauma  . Chronic total occlusion of artery of the extremities (HCC)    pt not aware of this  . Claudication ([place])   . ESRD on dialysis [facility])    "MWF; [name]" ((04/10/2016)  . ESRD on peritoneal dialysis [facility])    Started dialysis around April 2015 per son.  Has been doing peritoneal dialysis at home.     . Gout   . Hyperlipidemia   . Hypertension   . Overweight(278.02)   . Presence of permanent cardiac pacemaker    medtronic  . Prostate cancer ([place]) 1990s  . Shingles   . Stroke [facility]) ~1998   denies residual on 04/11/2016  . Type II diabetes mellitus (HCC)    diet controlled  . Umbilical hernia 1/74/0814   Past Surgical History  Past Surgical History:  Procedure Laterality Date  . AV FISTULA PLACEMENT, RADIOCEPHALIC  48/18/5631   Left arm  . CAPD INSERTION N/A 03/06/2013   Procedure: LAPAROSCOPIC INSERTION CONTINUOUS AMBULATORY PERITONEAL DIALYSIS  (CAPD) CATHETER;  Surgeon: Edward Jolly, MD;  Location: [place];  Service: General;   Laterality: N/A;  . CATARACT EXTRACTION Right   . CHOLECYSTECTOMY  12/02/2017   LAPROSCOPIC  . EYE SURGERY Left 1970s   for eye injury  . HERNIA REPAIR    . INSERTION PROSTATE RADIATION SEED    . LAPAROSCOPIC CHOLECYSTECTOMY SINGLE SITE WITH INTRAOPERATIVE CHOLANGIOGRAM N/A 12/02/2017   Procedure: LAPAROSCOPIC CHOLECYSTECTOMY;  Surgeon: Michael Boston, MD;  Location: [place];  Service: General;  Laterality: N/A;  . PACEMAKER IMPLANT N/A 04/20/2017   Procedure: PACEMAKER IMPLANT;  Surgeon: Evans Lance, MD;  Location: [place] CV LAB;  Service: Cardiovascular;  Laterality: N/A;  . UMBILICAL HERNIA REPAIR N/A 03/06/2013   Procedure: HERNIA REPAIR UMBILICAL WITH MESH;  Surgeon: Edward Jolly, MD;  Location: [facility] OR;  Service: General;  Laterality: N/A;   Family History  Family History  Problem Relation Age of Onset  . Cancer Mother   . Heart disease Mother   . Cancer Father    Social History  reports that he has never smoked. He has never used smokeless tobacco. He reports that he does not drink alcohol or use drugs. Allergies  Allergies  Allergen Reactions  . Cardura [Doxazosin Mesylate] Other (See Comments)    Hallucinations  Home medications Prior to Admission medications   Medication Sig Start Date End Date Taking? Authorizing Provider  allopurinol (ZYLOPRIM) 100 MG tablet Take 100 mg by mouth daily.     Yes [provider]  amLODipine (NORVASC) 10 MG tablet Take 10 mg by mouth at bedtime.  01/04/17  Yes [provider]  atorvastatin (LIPITOR) 40 MG tablet Take 40 mg by mouth daily. 07/07/17  Yes [provider]  calcium acetate (PHOSLO) 667 MG capsule Take 1,334-2,001 mg by mouth See admin instructions. Take 2001 mg by mouth 3 times daily with meals and take 1344 mg by mouth with snacks   Yes [provider]  calcium carbonate (TUMS EX) 750 MG chewable tablet Chew 4 tablets by mouth 2 (two) times daily.   Yes [provider]   Cholecalciferol (VITAMIN D-3) 1000 units CAPS Take 1,000 Units by mouth daily.   Yes [provider]  gabapentin (NEURONTIN) 100 MG capsule Take 100 mg by mouth at bedtime. 12/27/16  Yes [provider]  ibuprofen (ADVIL,MOTRIN) 200 MG tablet Take 400 mg by mouth every 6 (six) hours as needed for headache or moderate pain.   Yes [provider]  lidocaine-prilocaine (EMLA) cream Apply 1 application topically as needed (for port access).  10/28/17  Yes [provider]  lisinopril (PRINIVIL,ZESTRIL) 10 MG tablet Take 10 mg by mouth at bedtime. 03/25/17  Yes [provider]  loperamide (IMODIUM A-D) 2 MG tablet Take 4 mg by mouth 4 (four) times daily as needed for diarrhea or loose stools.   Yes [provider]  multivitamin (RENA-VIT) TABS tablet Take 1 tablet by mouth daily.   Yes [provider]  sevelamer carbonate (RENVELA) 2.4 g PACK Take 7.2 g by mouth 3 (three) times daily with meals.  10/13/17  Yes [provider]  traMADol (ULTRAM) 50 MG tablet Take 1 tablet (50 mg total) by mouth every 6 (six) hours as needed for moderate pain or severe pain. 12/02/17  Liston Alba, MD   Liver Function Tests Recent Labs  Lab 12/03/17 0843 12/06/17 1419 12/07/17 0522  AST  --  26 17  ALT  --  7 6  ALKPHOS  --  49 46  BILITOT  --  0.9 0.6  PROT  --  6.7 5.8*  ALBUMIN 3.0* 3.3* 2.7*  2.6*   Recent Labs  Lab 12/06/17 1419  LIPASE 34   CBC Recent Labs  Lab 12/03/17 0843 12/06/17 1419 12/07/17 0522  WBC 9.6 14.0* 13.7*  NEUTROABS  --  11.5*  --   HGB 8.7* 9.1* 9.0*  HCT 28.4* 29.7* 29.9*  MCV 93.4 93.1 92.9  PLT 264 294 867   Basic Metabolic Panel Recent Labs  Lab 12/02/17 0616 12/03/17 0843 12/06/17 1419 12/06/17 2104 12/07/17 0522  NA 140 139 138  --  137  K 4.1 4.6 6.1* 5.6* 6.4*  CL  --  97* 95*  --  96*  CO2  --  27 26  --  26  GLUCOSE 94 110* 101*  --  83  BUN  --  43* 54*  --  58*  CREATININE   --  9.94* 10.60*  --  11.14*  CALCIUM  --  9.5 9.8  --  9.2  PHOS  --  9.1*  --   --  6.2*   Iron/TIBC/Ferritin/ %Sat    Component Value Date/Time   IRON 20 (L) 03/15/2013 1232   TIBC 164 (L) 03/15/2013 1232  IRONPCTSAT 12 (L) 03/15/2013 1232    Vitals:   12/06/17 2055 12/07/17 0036 12/07/17 0618 12/07/17 0744  BP: (!) 155/102 (!) 146/89 (!) 165/87 (!) 160/89  Pulse: 98 89 99 95  Resp: 18 20 18 18   Temp: 98.9 F (37.2 C)  98.6 F (37 C) 98.2 F (36.8 C)  TempSrc:   Oral Oral  SpO2: 94%  91% 96%  Weight:      Height:       Exam Gen alert elderly BM no distress No rash, cyanosis or gangrene Sclera anicteric, throat clear  No jvd or bruits Chest clear bilat to bases RRR soft blowing sem no RG Abd firm, nontender, no mass or ascites +bs GU normal male MS no joint effusions or deformity Ext 1+ bilat LE edema, no wounds or ulcers Neuro is alert, Ox 3 , nf L forearm AVF+bruit    Home meds:  - amlodipine 10/ lisinopril 10 hs  - sevelamer carb tid ac/ calc acetate tid ac  - atorvastatin 40/ allopurinol 100/ gabapentin 100 hx/ tramadol prn  - prn's/ vitamins   Dialysis: MWF GOC 4h   450 bfr   89kg  2/2 bath  Hep 6000  LFA AVF  - cinacalcet 150 po tiw  - mircera 100 ug every 2 wks  - vit D 1 ug tiw po w hd   Impression/ Plan: 1. Abdominal pain - possilby d/t constipation. Feeling better today after BM.  Per primary 2. ESRD on HD MWF - missed HD yest, will do short HD today for ^K+.  Will have regular HD tomorrow (as OP if dc'd vs as IP if not) 3. Hyperkalemia - rx w/ HD today 4. HTN - cont meds 5. Volume - is at dry wt 6. Obestiy 7. Blind left eye 8. PPM   Kelly Splinter MD [employer] pager [phone]   12/07/2017, 9:34 AM

## 2017-12-07 NOTE — Discharge Instructions (Signed)
GETTING TO GOOD BOWEL HEALTH.  ######################################################################  EAT Gradually transition to a high fiber diet with a fiber supplement over the next few weeks after discharge.  Start with a pureed / full liquid diet (see below)  WALK Walk an hour a day.  Control your pain to do that.    HAVE A BOWEL MOVEMENT DAILY Keep your bowels regular to avoid problems.  OK to try a laxative to override constipation.  OK to use an antidairrheal to slow down diarrhea.  Call if not better after 2 tries  CALL IF YOU HAVE PROBLEMS/CONCERNS Call if you are still struggling despite following these instructions. Call if you have concerns not answered by these instructions  ######################################################################   Irregular bowel habits such as constipation and diarrhea can lead to many problems over time.  Having one soft bowel movement a day is the most important way to prevent further problems.  The anorectal canal is designed to handle stretching and feces to safely manage our ability to get rid of solid waste (feces, poop, stool) out of our body.  BUT, hard constipated stools can act like ripping concrete bricks and diarrhea can be a burning fire to this very sensitive area of our body, causing inflamed hemorrhoids, anal fissures, increasing risk is perirectal abscesses, abdominal pain/bloating, an making irritable bowel worse.      The goal: ONE SOFT BOWEL MOVEMENT A DAY!  To have soft, regular bowel movements:   Drink plenty of fluids, consider 4-6 tall glasses of water a day.    Take plenty of fiber.  Fiber is the undigested part of plant food that passes into the colon, acting s natures broom to encourage bowel motility and movement.  Fiber can absorb and hold large amounts of water. This results in a larger, bulkier stool, which is soft and easier to pass. Work gradually over several weeks up to 6 servings a day of fiber (25g a  day even more if needed) in the form of: o Vegetables -- Root (potatoes, carrots, turnips), leafy green (lettuce, salad greens, celery, spinach), or cooked high residue (cabbage, broccoli, etc) o Fruit -- Fresh (unpeeled skin & pulp), Dried (prunes, apricots, cherries, etc ),  or stewed ( applesauce)  o Whole grain breads, pasta, etc (whole wheat)  o Bran cereals   Bulking Agents -- This type of water-retaining fiber generally is easily obtained each day by one of the following:  o Psyllium bran -- The psyllium plant is remarkable because its ground seeds can retain so much water. This product is available as Metamucil, Konsyl, Effersyllium, Per Diem Fiber, or the less expensive generic preparation in drug and health food stores. Although labeled a laxative, it really is not a laxative.  o Methylcellulose -- This is another fiber derived from wood which also retains water. It is available as Citrucel. o Polyethylene Glycol - and artificial fiber commonly called Miralax or Glycolax.  It is helpful for people with gassy or bloated feelings with regular fiber o Flax Seed - a less gassy fiber than psyllium  No reading or other relaxing activity while on the toilet. If bowel movements take longer than 5 minutes, you are too constipated  AVOID CONSTIPATION.  High fiber and water intake usually takes care of this.  Sometimes a laxative is needed to stimulate more frequent bowel movements, but   Laxatives are not a good long-term solution as it can wear the colon out.  They can help jump-start bowels if constipated, but should  be relied on constantly without discussing with your doctor o Osmotics (Milk of Magnesia, Fleets phosphosoda, Magnesium citrate, MiraLax, GoLytely) are safer than  o Stimulants (Senokot, Castor Oil, Dulcolax, Ex Lax)    o Avoid taking laxatives for more than 7 days in a row.   IF SEVERELY CONSTIPATED, try a Bowel Retraining Program: o Do not use laxatives.  o Eat a diet high  in roughage, such as bran cereals and leafy vegetables.  o Drink six (6) ounces of prune or apricot juice each morning.  o Eat two (2) large servings of stewed fruit each day.  o Take one (1) heaping tablespoon of a psyllium-based bulking agent twice a day. Use sugar-free sweetener when possible to avoid excessive calories.  o Eat a normal breakfast.  o Set aside 15 minutes after breakfast to sit on the toilet, but do not strain to have a bowel movement.  o If you do not have a bowel movement by the third day, use an enema and repeat the above steps.   Controlling diarrhea o Switch to liquids and simpler foods for a few days to avoid stressing your intestines further. o Avoid dairy products (especially milk & ice cream) for a short time.  The intestines often can lose the ability to digest lactose when stressed. o Avoid foods that cause gassiness or bloating.  Typical foods include beans and other legumes, cabbage, broccoli, and dairy foods.  Every person has some sensitivity to other foods, so listen to our body and avoid those foods that trigger problems for you. o Adding fiber (Citrucel, Metamucil, psyllium, Miralax) gradually can help thicken stools by absorbing excess fluid and retrain the intestines to act more normally.  Slowly increase the dose over a few weeks.  Too much fiber too soon can backfire and cause cramping & bloating. o Probiotics (such as active yogurt, Align, etc) may help repopulate the intestines and colon with normal bacteria and calm down a sensitive digestive tract.  Most studies show it to be of mild help, though, and such products can be costly. o Medicines: - Bismuth subsalicylate (ex. Kayopectate, Pepto Bismol) every 30 minutes for up to 6 doses can help control diarrhea.  Avoid if pregnant. - Loperamide (Immodium) can slow down diarrhea.  Start with two tablets (4mg  total) first and then try one tablet every 6 hours.  Avoid if you are having fevers or severe pain.  If  you are not better or start feeling worse, stop all medicines and call your doctor for advice o Call your doctor if you are getting worse or not better.  Sometimes further testing (cultures, endoscopy, X-ray studies, bloodwork, etc) may be needed to help diagnose and treat the cause of the diarrhea.  TROUBLESHOOTING IRREGULAR BOWELS 1) Avoid extremes of bowel movements (no bad constipation/diarrhea) 2) Miralax 17gm mixed in 8oz. water or juice-daily. May use BID as needed.  3) Gas-x,Phazyme, etc. as needed for gas & bloating.  4) Soft,bland diet. No spicy,greasy,fried foods.  5) Prilosec over-the-counter as needed  6) May hold gluten/wheat products from diet to see if symptoms improve.  7)  May try probiotics (Align, Activa, etc) to help calm the bowels down 7) If symptoms become worse call back immediately.   LAPAROSCOPIC SURGERY: POST OP INSTRUCTIONS  ######################################################################  EAT Gradually transition to a high fiber diet with a fiber supplement over the next few weeks after discharge.  Start with a pureed / full liquid diet (see below)  WALK Walk an hour  a day.  Control your pain to do that.    CONTROL PAIN Control pain so that you can walk, sleep, tolerate sneezing/coughing, go up/down stairs.  HAVE A BOWEL MOVEMENT DAILY Keep your bowels regular to avoid problems.  OK to try a laxative to override constipation.  OK to use an antidairrheal to slow down diarrhea.  Call if not better after 2 tries  CALL IF YOU HAVE PROBLEMS/CONCERNS Call if you are still struggling despite following these instructions. Call if you have concerns not answered by these instructions  ######################################################################    1. DIET: Follow a light bland diet the first 24 hours after arrival home, such as soup, liquids, crackers, etc.  Be sure to include lots of fluids daily.  Avoid fast food or heavy meals as your are  more likely to get nauseated.  Eat a low fat the next few days after surgery.   2. Take your usually prescribed home medications unless otherwise directed. 3. PAIN CONTROL: a. Pain is best controlled by a usual combination of three different methods TOGETHER: i. Ice/Heat ii. Over the counter pain medication iii. Prescription pain medication b. Most patients will experience some swelling and bruising around the incisions.  Ice packs or heating pads (30-60 minutes up to 6 times a day) will help. Use ice for the first few days to help decrease swelling and bruising, then switch to heat to help relax tight/sore spots and speed recovery.  Some people prefer to use ice alone, heat alone, alternating between ice & heat.  Experiment to what works for you.  Swelling and bruising can take several weeks to resolve.   c. It is helpful to take an over-the-counter pain medication regularly for the first few weeks.  Choose one of the following that works best for you: i. Naproxen (Aleve, etc)  Two 220mg  tabs twice a day ii. Ibuprofen (Advil, etc) Three 200mg  tabs four times a day (every meal & bedtime) iii. Acetaminophen (Tylenol, etc) 500-650mg  four times a day (every meal & bedtime) d. A  prescription for pain medication (such as oxycodone, hydrocodone, etc) should be given to you upon discharge.  Take your pain medication as prescribed.  i. If you are having problems/concerns with the prescription medicine (does not control pain, nausea, vomiting, rash, itching, etc), please call us [phone] to see if we need to switch you to a different pain medicine that will work better for you and/or control your side effect better. ii. If you need a refill on your pain medication, please contact your pharmacy.  They will contact our office to request authorization. Prescriptions will not be filled after 5 pm or on week-ends. 4. Avoid getting constipated.  Between the surgery and the pain medications, it is common to  experience some constipation.  Increasing fluid intake and taking a fiber supplement (such as Metamucil, Citrucel, FiberCon, MiraLax, etc) 1-2 times a day regularly will usually help prevent this problem from occurring.  A mild laxative (prune juice, Milk of Magnesia, MiraLax, etc) should be taken according to package directions if there are no bowel movements after 48 hours.   5. Watch out for diarrhea.  If you have many loose bowel movements, simplify your diet to bland foods & liquids for a few days.  Stop any stool softeners and decrease your fiber supplement.  Switching to mild anti-diarrheal medications (Kayopectate, Pepto Bismol) can help.  If this worsens or does not improve, please call us. 6. Wash / shower every day.  You may shower over the dressings as they are waterproof.  Continue to shower over incision(s) after the dressing is off. 7. Remove your waterproof bandages 5 days after surgery.  You may leave the incision open to air.  You may replace a dressing/Band-Aid to cover the incision for comfort if you wish.  8. ACTIVITIES as tolerated:   a. You may resume regular (light) daily activities beginning the next day--such as daily self-care, walking, climbing stairs--gradually increasing activities as tolerated.  If you can walk 30 minutes without difficulty, it is safe to try more intense activity such as jogging, treadmill, bicycling, low-impact aerobics, swimming, etc. b. Save the most intensive and strenuous activity for last such as sit-ups, heavy lifting, contact sports, etc  Refrain from any heavy lifting or straining until you are off narcotics for pain control.   c. DO NOT PUSH THROUGH PAIN.  Let pain be your guide: If it hurts to do something, don't do it.  Pain is your body warning you to avoid that activity for another week until the pain goes down. d. You may drive when you are no longer taking prescription pain medication, you can comfortably wear a seatbelt, and you can safely  maneuver your car and apply brakes. e. Dennis Bast may have sexual intercourse when it is comfortable.  9. FOLLOW UP in our office a. Please call [facility] at [phone] to set up an appointment to see your surgeon in the office for a follow-up appointment approximately 2-3 weeks after your surgery. b. Make sure that you call for this appointment the day you arrive home to insure a convenient appointment time. 10. IF YOU HAVE DISABILITY OR FAMILY LEAVE FORMS, BRING THEM TO THE OFFICE FOR PROCESSING.  DO NOT GIVE THEM TO YOUR DOCTOR.   WHEN TO CALL us [phone]: 1. Poor pain control 2. Reactions / problems with new medications (rash/itching, nausea, etc)  3. Fever over 101.5 F (38.5 C) 4. Inability to urinate 5. Nausea and/or vomiting 6. Worsening swelling or bruising 7. Continued bleeding from incision. 8. Increased pain, redness, or drainage from the incision   The clinic staff is available to answer your questions during regular business hours (8:30am-5pm).  Please dont hesitate to call and ask to speak to one of our nurses for clinical concerns.   If you have a medical emergency, go to the nearest emergency room or call 911.  A surgeon from [facility] is always on call at the [facility], [place], [place], [place], [place]  [address] ? MAIN: [phone] ? TOLL FREE: [phone] ?  FAX [phone] [email or web address]

## 2017-12-07 NOTE — Procedures (Signed)
   I was present at this dialysis session, have reviewed the session itself and made  appropriate changes Kelly Splinter MD [place] pager [phone]   12/07/2017, 4:45 PM

## 2017-12-07 NOTE — Progress Notes (Signed)
Pharmacy Antibiotic Note  Sean Best is a 84 y.o. male admitted on 12/06/2017 with r/o intra-abd infection.  Pharmacy has been consulted for Zosyn dosing. WBC 14, Tm 100.2.  Pt with ESRD - HD (M/W/F).   Plan: Zosyn 3.375gm IV q12 -doses over 4 hours Will f/u micro data, renal function, and pt's clinical condition   Height: 5\' 5"  (165.1 cm) Weight: 197 lb (89.4 kg) IBW/kg (Calculated) : 61.5  Temp (24hrs), Avg:99.6 F (37.6 C), Min:98.9 F (37.2 C), Max:100.2 F (37.9 C)  Recent Labs  Lab 12/03/17 0843 12/06/17 1419 12/06/17 1441 12/06/17 1657  WBC 9.6 14.0*  --   --   CREATININE 9.94* 10.60*  --   --   LATICACIDVEN  --   --  2.29* 2.20*    Estimated Creatinine Clearance: 5.5 mL/min (A) (by C-G formula based on SCr of 10.6 mg/dL (H)).    Allergies  Allergen Reactions  . Cardura [Doxazosin Mesylate] Other (See Comments)    Hallucinations    Antimicrobials this admission: 10/7 Zosyn >>   Microbiology results: 10/7 BCx:  10/7 MRSA PCR:   Thank you for allowing pharmacy to be a part of this patient's care.  Sherlon Handing, PharmD, BCPS Clinical pharmacist  **Pharmacist phone directory can now be found on [email or web address] ([email or web address]).  Listed under [place]. 12/07/2017 12:02 AM

## 2017-12-07 NOTE — Progress Notes (Signed)
Results for Sean Best, Sean Best (MRN [id number]) as of 12/07/2017 06:47  Ref. Range 12/07/2017 05:22  Potassium Latest Ref Range: 3.5 - 5.1 mmol/L 6.4 (HH)  MD on call paged.

## 2017-12-08 DIAGNOSIS — Z992 Dependence on renal dialysis: Secondary | ICD-10-CM | POA: Diagnosis not present

## 2017-12-08 DIAGNOSIS — N186 End stage renal disease: Secondary | ICD-10-CM | POA: Diagnosis not present

## 2017-12-08 DIAGNOSIS — K5900 Constipation, unspecified: Secondary | ICD-10-CM | POA: Diagnosis present

## 2017-12-08 DIAGNOSIS — I10 Essential (primary) hypertension: Secondary | ICD-10-CM | POA: Diagnosis not present

## 2017-12-08 DIAGNOSIS — G8918 Other acute postprocedural pain: Secondary | ICD-10-CM | POA: Diagnosis not present

## 2017-12-08 DIAGNOSIS — R1084 Generalized abdominal pain: Secondary | ICD-10-CM | POA: Diagnosis not present

## 2017-12-08 LAB — GLUCOSE, CAPILLARY
Glucose-Capillary: 84 mg/dL (ref 70–99)
Glucose-Capillary: 120 mg/dL — ABNORMAL HIGH (ref 70–99)

## 2017-12-08 LAB — BASIC METABOLIC PANEL
Anion gap: 13 (ref 5–15)
BUN: 37 mg/dL — ABNORMAL HIGH (ref 8–23)
CHLORIDE: 95 mmol/L — AB (ref 98–111)
CO2: 27 mmol/L (ref 22–32)
CREATININE: 8.79 mg/dL — AB (ref 0.61–1.24)
Calcium: 9.4 mg/dL (ref 8.9–10.3)
GFR calc non Af Amer: 5 mL/min — ABNORMAL LOW (ref >60)
GFR, EST AFRICAN AMERICAN: 6 mL/min — AB (ref >60)
Glucose, Bld: 86 mg/dL (ref 70–99)
POTASSIUM: 4.9 mmol/L (ref 3.5–5.1)
Sodium: 135 mmol/L (ref 135–145)

## 2017-12-08 MED ORDER — DOCUSATE SODIUM 100 MG PO CAPS: 100.0000 mg | ORAL_CAPSULE | Freq: Two times a day (BID) | ORAL | 0 refills | Status: AC

## 2017-12-08 MED ORDER — CHLORHEXIDINE GLUCONATE CLOTH 2 % EX PADS: 6.0000 | MEDICATED_PAD | Freq: Every day | CUTANEOUS | Status: DC

## 2017-12-08 MED ORDER — POLYETHYLENE GLYCOL 3350 17 G PO PACK: 17.0000 g | PACK | Freq: Every day | ORAL | 0 refills | Status: DC

## 2017-12-08 MED ORDER — PANTOPRAZOLE SODIUM 40 MG PO TBEC: 40.0000 mg | DELAYED_RELEASE_TABLET | Freq: Two times a day (BID) | ORAL | 0 refills | Status: DC

## 2017-12-08 NOTE — Evaluation (Signed)
Physical Therapy Evaluation Patient Details Name: Sean Best MRN: [id number] DOB: 06/21/1933 Today's Date: 12/08/2017   History of Present Illness  Pt is an 84 y/o male admitted secondary to abdominal pain, thought to be secondary to constipation following recent cholecystectomy. PMH includes ESRD on HD MWF, HTN, Gout, DM, CVA, and pacemaker.   Clinical Impression  Pt admitted secondary to problem above with deficits below. Pt presenting with weakness and fatigue during mobility. Requiring min guard A for mobility using RW. Educated about safe technique for stair management. Pt reports daughter can assist intermittently when not at work. Will continue to follow acutely to maximize functional mobility independence and safety.     Follow Up Recommendations Home health PT;Supervision for mobility/OOB    Equipment Recommendations  Rolling walker with 5" wheels;3in1 (PT)    Recommendations for Other Services       Precautions / Restrictions Precautions Precautions: Fall Restrictions Weight Bearing Restrictions: No      Mobility  Bed Mobility Overal bed mobility: Needs Assistance Bed Mobility: Supine to Sit;Sit to Supine     Supine to sit: Supervision Sit to supine: Supervision   General bed mobility comments: Supervision for safety and increased time required. Heavy reliance on rails.   Transfers Overall transfer level: Needs assistance Equipment used: Rolling walker (2 wheeled) Transfers: Sit to/from Stand Sit to Stand: Min guard         General transfer comment: Min guard for safety. Demonstrated safe hand placement.   Ambulation/Gait Ambulation/Gait assistance: Min guard Gait Distance (Feet): 125 Feet Assistive device: Rolling walker (2 wheeled) Gait Pattern/deviations: Step-through pattern;Decreased stride length;Trunk flexed Gait velocity: Decresaed    General Gait Details: Slow, cautious gait. Verbal cues for upright posture and proximity to device. Distance  limited secondary to fatigue. Educated about walking program to perform at home.   Stairs            Wheelchair Mobility    Modified Rankin (Stroke Patients Only)       Balance Overall balance assessment: Needs assistance Sitting-balance support: No upper extremity supported;Feet supported Sitting balance-Leahy Scale: Fair     Standing balance support: Bilateral upper extremity supported;During functional activity Standing balance-Leahy Scale: Poor Standing balance comment: Reliant on BUE Support                              Pertinent Vitals/Pain Pain Assessment: Faces Faces Pain Scale: No hurt    Home Living Family/patient expects to be discharged to:: Private residence Living Arrangements: Children Available Help at Discharge: Family;Available PRN/intermittently Type of Home: House Home Access: Stairs to enter Entrance Stairs-Rails: Psychiatric nurse of Steps: 4-5 Home Layout: One level Home Equipment: Cane - single point      Prior Function Level of Independence: Independent with assistive device(s)         Comments: Uses cane for ambulation      Hand Dominance        Extremity/Trunk Assessment   Upper Extremity Assessment Upper Extremity Assessment: Generalized weakness    Lower Extremity Assessment Lower Extremity Assessment: Generalized weakness    Cervical / Trunk Assessment Cervical / Trunk Assessment: Normal  Communication   Communication: No difficulties  Cognition Arousal/Alertness: Awake/alert Behavior During Therapy: WFL for tasks assessed/performed Overall Cognitive Status: Within Functional Limits for tasks assessed  General Comments      Exercises     Assessment/Plan    PT Assessment Patient needs continued PT services  PT Problem List Decreased strength;Decreased mobility;Decreased activity tolerance;Decreased knowledge of use of  DME;Decreased knowledge of precautions       PT Treatment Interventions DME instruction;Gait training;Stair training;Functional mobility training;Therapeutic activities;Balance training;Therapeutic exercise;Patient/family education    PT Goals (Current goals can be found in the Care Plan section)  Acute Rehab PT Goals Patient Stated Goal: to go home  PT Goal Formulation: With patient Time For Goal Achievement: 12/22/17 Potential to Achieve Goals: Good    Frequency Min 3X/week   Barriers to discharge        Co-evaluation               AM-PAC PT "6 Clicks" Daily Activity  Outcome Measure Difficulty turning over in bed (including adjusting bedclothes, sheets and blankets)?: A Lot Difficulty moving from lying on back to sitting on the side of the bed? : Unable Difficulty sitting down on and standing up from a chair with arms (e.g., wheelchair, bedside commode, etc,.)?: Unable Help needed moving to and from a bed to chair (including a wheelchair)?: A Little Help needed walking in hospital room?: A Little Help needed climbing 3-5 steps with a railing? : A Little 6 Click Score: 13    End of Session Equipment Utilized During Treatment: Gait belt Activity Tolerance: Patient limited by fatigue Patient left: in bed;with call bell/phone within reach;with bed alarm set Nurse Communication: Mobility status PT Visit Diagnosis: Muscle weakness (generalized) (M62.81);Other abnormalities of gait and mobility (R26.89)    Time: 9030-0923 PT Time Calculation (min) (ACUTE ONLY): 12 min   Charges:   PT Evaluation $PT Eval Low Complexity: [place], PT, DPT  Acute Rehabilitation Services  Pager: [phone] Office: [phone]   Rudean Hitt 12/08/2017, 11:38 AM

## 2017-12-08 NOTE — Care Management Obs Status (Signed)
[facility] NOTIFICATION   Patient Details  Name: Sean Best MRN: [id number] Date of Birth: 1933/09/08   Medicare Observation Status Notification Given:  Yes    Bethena Roys, RN 12/08/2017, 4:14 PM

## 2017-12-08 NOTE — Progress Notes (Signed)
Patient is being discharged with daughter  to home.

## 2017-12-08 NOTE — Progress Notes (Signed)
[facility] Progress Note  Subjective: no c/o today. Daughter notes having jerking of extremities over last weekend, better today, was taking ultram postop and gabapentin   Vitals:   12/07/17 1645 12/07/17 2046 12/08/17 0537 12/08/17 0747  BP: (!) 154/90 (!) 168/90 (!) 165/88 (!) 175/92  Pulse: (!) 101 (!) 102 (!) 101 99  Resp: 18 18 18 18   Temp: 99 F (37.2 C) 99.7 F (37.6 C) 98.6 F (37 C) 98.5 F (36.9 C)  TempSrc: Oral Oral Oral Oral  SpO2: 99% 98% 95% 97%  Weight:  92.8 kg    Height:        Inpatient medications: . allopurinol  100 mg Oral Daily  . amLODipine  10 mg Oral QHS  . atorvastatin  40 mg Oral Daily  . calcium acetate  2,001 mg Oral TID WC   And  . calcium acetate  1,334 mg Oral With snacks  . calcium carbonate  3,000 mg Oral BID  . Chlorhexidine Gluconate Cloth  6 each Topical Q0600  . cholecalciferol  1,000 Units Oral Daily  . docusate sodium  100 mg Oral BID  . feeding supplement  237 mL Oral BID BM  . heparin  5,000 Units Subcutaneous Q8H  . lip balm  1 application Topical BID  . lisinopril  10 mg Oral QHS  . multivitamin  1 tablet Oral Daily  . pantoprazole  40 mg Oral BID  . psyllium  1 packet Oral BID  . sevelamer carbonate  7.2 g Oral TID WC   . methocarbamol (ROBAXIN) IV     acetaminophen, acetaminophen, guaiFENesin-dextromethorphan, hydrALAZINE, magic mouthwash, menthol-cetylpyridinium, methocarbamol (ROBAXIN) IV, methocarbamol, phenol, prochlorperazine, simethicone, traMADol, traMADol  Iron/TIBC/Ferritin/ %Sat    Component Value Date/Time   IRON 20 (L) 03/15/2013 1232   TIBC 164 (L) 03/15/2013 1232   IRONPCTSAT 12 (L) 03/15/2013 1232    Exam: Gen alert elderly BM no distress No rash, cyanosis or gangrene Sclera anicteric, throat clear  No jvd or bruits Chest clear bilat to bases RRR soft blowing sem no RG Abd firm, nontender, no mass or ascites +bs GU normal male MS no joint effusions or deformity Ext 1+ bilat LE  edema, no wounds or ulcers Neuro is alert, Ox 3 , nf L forearm AVF+bruit    Home meds:  - amlodipine 10/ lisinopril 10 hs  - sevelamer carb tid ac/ calc acetate tid ac  - atorvastatin 40/ allopurinol 100/ gabapentin 100 hx/ tramadol prn  - prn's/ vitamins   Dialysis: MWF GOC 4h   450 bfr   89kg  2/2 bath  Hep 6000  LFA AVF  - cinacalcet 150 po tiw  - mircera 100 ug every 2 wks  - vit D 1 ug tiw po w hd   Impression/ Plan: 1. Abdominal pain - resolved, prob d/t constipation. OK For dc after HD today from renal standpoint.  2. ESRD on HD MWF - has HD yest , plan HD today to get back on sched 3. Hyperkalemia - resolved 4. HTN - cont meds (norvasc/ lisinopril) 5. Volume - up 2kg 6. Obestiy 7. Blind left eye 8. PPM    Kelly Splinter MD [facility] pager [phone]   12/08/2017, 11:11 AM   Recent Labs  Lab 12/03/17 0843 12/06/17 1419  12/07/17 0522 12/08/17 0853  NA 139 138  --  137 135  K 4.6 6.1*   < > 6.4* 4.9  CL 97* 95*  --  96* 95*  CO2  27 26  --  26 27  GLUCOSE 110* 101*  --  83 86  BUN 43* 54*  --  58* 37*  CREATININE 9.94* 10.60*  --  11.14* 8.79*  CALCIUM 9.5 9.8  --  9.2 9.4  PHOS 9.1*  --   --  6.2*  --   ALBUMIN 3.0* 3.3*  --  2.7*  2.6*  --    < > = values in this interval not displayed.   Recent Labs  Lab 12/06/17 1419 12/07/17 0522  AST 26 17  ALT 7 6  ALKPHOS 49 46  BILITOT 0.9 0.6  PROT 6.7 5.8*   Recent Labs  Lab 12/06/17 1419 12/07/17 0522  WBC 14.0* 13.7*  NEUTROABS 11.5*  --   HGB 9.1* 9.0*  HCT 29.7* 29.9*  MCV 93.1 92.9  PLT 294 247

## 2017-12-08 NOTE — Care Management CC44 (Signed)
Condition Code 44 Documentation Completed  Patient Details  Name: Sean Best MRN: [id number] Date of Birth: [birth year]   Condition Code 44 given:  Yes Patient signature on Condition Code 44 notice:  Yes Documentation of 2 MD's agreement:  Yes Code 44 added to claim:  Yes    Bethena Roys, RN 12/08/2017, 4:14 PM

## 2017-12-08 NOTE — Care Management Note (Signed)
Case Management Note  Patient Details  Name: Sean Best MRN: [id number] Date of Birth: 11/13/1933  Subjective/Objective: Pt presented for Abdominal Pain- PTA from home with daughter. Pt has HD MWF 11 am- 5 pm. Plan for home today- daughter will transport from the hospital. Staff RN to call once patient back in the room                    Action/Plan: Pt was in HD upon arrival to floor. CM did go to HD to speak with patient. Patient stated to call Daughter [place]. CM did call [name] to offer choice for [facility] and DME. [facility] via [place] and DME via [place]. CM did make referral to [facility] with Alvis Lemmings and SOC to begin within 24-48 hours post transition home. DME referral made to [facility] with [facility]- dme to be delivered to room prior to transition home.   Expected Discharge Date:  12/08/17               Expected Discharge Plan:  [place]  In-House Referral:  NA  Discharge planning Services  CM Consult  Post Acute Care Choice:  Durable Medical Equipment Choice offered to:  Patient, Adult Children  DME Arranged:  3-N-1, Walker rolling DME Agency:  [place]:  [place]:  [place]  Status of Service:  Completed, signed off  If discussed at [place] of Stay Meetings, dates discussed:    Additional Comments:  Bethena Roys, RN 12/08/2017, 4:08 PM

## 2017-12-08 NOTE — Discharge Summary (Signed)
Physician Discharge Summary  Sean Best WPY:[id number] DOB: 1933-08-05 DOA: 12/06/2017  PCP: Seward Carol, MD  Admit date: 12/06/2017 Discharge date: 12/08/2017  Admitted From: Home Disposition:  Home  Recommendations for Outpatient Follow-up:  1. Follow up with PCP in 1-2 weeks 2. Please obtain BMP/CBC in one week 3. Please follow up on the following pending results:  Home Health:PT  Equipment/Devices:rolling walker, 3 in 1 commode    Discharge Condition:Improved CODE STATUS:Full Diet recommendation: Renal   Brief/Interim Summary: 84 year old male with ESRD on hemodialysis Monday Wednesday Friday, type 2 diabetes, CVA, permanent pacemaker, hypertension, recent laparoscopic cholecystectomy for acute on chronic cholecystitis on 10/3, subsequently discharged home admitted with worsening abdominal pain since 10/6. -CT scan of the emergency room last night which showed minimal fluid in the gallbladder fossa haziness of surrounding fat planes and irregular thickening of pyloric region, fluid-filled parts of colon    Abdominal pain -Suspected to be secondary to postop pain and constipation -Clinically no evidence of bile leak -GI general surgery consult appreciated -Symptoms much improved after laxatives and BM -Successfully advanced diet  Irregular thickening of pyloric region -No symptoms of dysphagia or odynophagia -Advised pt to FU with gastroenterology in few weeks for elective endoscopy  ESRD on hemodialysis -Continued on HD while in hospital  Hyperkalemia -given calcium gluconate IV -Underwent dialysis  Leukocytosis -Mild, suspected to be reactive  -Appreciate general surgery consult, not felt to have postop complications are biliary leak  Hypertension -BP elevated due to missed dialysis -Continued on lisinopril and amlodipine  Type 2 diabetes mellitus -Last hemoglobin A1c was 5.6, not on home medications, monitor  Chronic pain -Continue home regimen of  tramadol and gabapentin   Discharge Diagnoses:  Principal Problem:   Abdominal pain Active Problems:   ESRD on dialysis ([place])   Hyperkalemia   Anemia   HTN (hypertension)   Leukocytosis   Gout   Chronic pain   Constipation    Discharge Instructions   Allergies as of 12/08/2017      Reactions   Cardura [doxazosin Mesylate] Other (See Comments)   Hallucinations      Medication List    STOP taking these medications   ibuprofen 200 MG tablet Commonly known as:  ADVIL,MOTRIN   loperamide 2 MG tablet Commonly known as:  IMODIUM A-D     TAKE these medications   allopurinol 100 MG tablet Commonly known as:  ZYLOPRIM Take 100 mg by mouth daily.   amLODipine 10 MG tablet Commonly known as:  NORVASC Take 10 mg by mouth at bedtime.   atorvastatin 40 MG tablet Commonly known as:  LIPITOR Take 40 mg by mouth daily.   calcium acetate 667 MG capsule Commonly known as:  PHOSLO Take 1,334-2,001 mg by mouth See admin instructions. Take 2001 mg by mouth 3 times daily with meals and take 1344 mg by mouth with snacks   calcium carbonate 750 MG chewable tablet Commonly known as:  TUMS EX Chew 4 tablets by mouth 2 (two) times daily.   docusate sodium 100 MG capsule Commonly known as:  COLACE Take 1 capsule (100 mg total) by mouth 2 (two) times daily.   gabapentin 100 MG capsule Commonly known as:  NEURONTIN Take 100 mg by mouth at bedtime.   lidocaine-prilocaine cream Commonly known as:  EMLA Apply 1 application topically as needed (for port access).   lisinopril 10 MG tablet Commonly known as:  PRINIVIL,ZESTRIL Take 10 mg by mouth at bedtime.   multivitamin Tabs tablet Take 1  tablet by mouth daily.   pantoprazole 40 MG tablet Commonly known as:  PROTONIX Take 1 tablet (40 mg total) by mouth 2 (two) times daily.   polyethylene glycol packet Commonly known as:  MIRALAX / GLYCOLAX Take 17 g by mouth daily.   sevelamer carbonate 2.4 g Pack Commonly known as:   RENVELA Take 7.2 g by mouth 3 (three) times daily with meals.   traMADol 50 MG tablet Commonly known as:  ULTRAM Take 1 tablet (50 mg total) by mouth every 6 (six) hours as needed for moderate pain or severe pain.   Vitamin D-3 1000 units Caps Take 1,000 Units by mouth daily.      Follow-up Information    Michael Boston, MD. Schedule an appointment as soon as possible for a visit in 3 week(s).   Specialty:  General Surgery Contact information: [address] [place] [address] [phone]        Seward Carol, MD. Schedule an appointment as soon as possible for a visit in 2 week(s).   Specialty:  Internal Medicine Contact information: [address] [place] [address] [phone]        Evans Lance, MD .   Specialty:  Cardiology Contact information: [phone] [place] [address] [phone]          Allergies  Allergen Reactions  . Cardura [Doxazosin Mesylate] Other (See Comments)    Hallucinations    Consultations:  Nephrology  General Surgery  Procedures/Studies: Dg Chest 2 View  Result Date: 12/06/2017 CLINICAL DATA:  Generalized weakness and fever and abdominal pain for the past 2 days. EXAM: CHEST - 2 VIEW COMPARISON:  Chest x-ray of September 01, 2017 FINDINGS: The lungs are reasonably well inflated. There is no focal infiltrate. There is no pleural effusion. There is some thickening of the major fissure on the right which is stable. The cardiac silhouette is enlarged. The pulmonary vascularity is not engorged. There is calcification in the wall of the aortic arch. The trachea is midline. The ICD is in stable position. The bony thorax exhibits no acute abnormality. IMPRESSION: There is no active cardiopulmonary disease. Thoracic aortic atherosclerosis. Electronically Signed   By: David  Martinique M.D.   On: 12/06/2017 15:09   Ct Abdomen Pelvis W Contrast  Result Date: 12/06/2017 CLINICAL DATA:   84 year old male with diffuse abdominal pain and constipation for 2 days. Post laparoscopic cholecystectomy 12/02/2017. Initial encounter. EXAM: CT ABDOMEN AND PELVIS WITH CONTRAST TECHNIQUE: Multidetector CT imaging of the abdomen and pelvis was performed using the standard protocol following bolus administration of intravenous contrast. CONTRAST:  176mL OMNIPAQUE IOHEXOL 300 MG/ML  SOLN COMPARISON:  05/15/2016 CT. FINDINGS: Lower chest: Bibasilar atelectasis. Cardiomegaly. Sequential pacemaker in place. Aortic and mitral valve calcification. Coronary artery calcification. Hepatobiliary: Post recent cholecystectomy. Minimal amount of fluid within the gallbladder fossa and haziness of surrounding fat planes may be related to recent surgery rather than leak or infection. No worrisome hepatic lesion noted. Pancreas: No pancreatic mass or primary pancreatic inflammation. Spleen: No splenic mass or enlargement. Adrenals/Urinary Tract: No obstructing stone or hydronephrosis. Multiple renal lesions some of which are calcified and complex. Largest lesion is 3.5 cm in left upper pole and relatively similar to prior exam. Hyperplasia adrenal glands. Decompressed urinary bladder with circumferential wall thickening. Stomach/Bowel: Irregular thickening of the pyloric region. Question inflammation or mass. Fluid-filled portions of colon. Moderate stool rectosigmoid region. Taking into account slight limitation by motion degradation,  no extraluminal bowel inflammatory process is noted. Specifically, no inflammation surrounds the appendix or small number of diverticula. Vascular/Lymphatic: Atherosclerotic changes aorta and aortic branch vessels with infrarenal 3.1 cm abdominal aortic aneurysm similar to prior exam. No narrowing of vessels but without large vessel occlusion. Scattered normal size lymph nodes. Reproductive: Post prostatic seed implantation. Other: No free air or bowel containing hernia. Mild diastases rectus  muscles. Laparoscopic ports noted. Musculoskeletal: Severe degenerative changes L3-4 through L5-S1. Bilateral hip joint degenerative changes. IMPRESSION: 1. Post recent cholecystectomy. Minimal amount of fluid within the gallbladder fossa and haziness of surrounding fat planes may be related to recent surgery rather than leak or infection. However, if there were progressive symptoms, recommend close follow-up imaging to evaluate this region. 2. Irregular thickening of the pyloric region. Question inflammation or mass. 3. Fluid-filled portions of colon. Moderate stool rectosigmoid region. Taking into account slight limitation by motion degradation, no extraluminal bowel inflammatory process is noted. 4. Multiple renal lesions some of which are calcified and complex. Largest lesion is 3.5 cm in left upper pole and relatively similar to prior exam. 5. Aortic Atherosclerosis (ICD10-I70.0). Stable 3.1 cm abdominal aortic aneurysm. Recommend followup by ultrasound in 3 years. This recommendation follows ACR consensus guidelines: White Paper of the ACR Incidental Findings Committee II on Vascular Findings. J Am Coll Radiol 2013; 11:914-782 6. Post prostatic seed implant. 7. Severe degenerative changes L3-4 through L5-S1. Bilateral hip joint degenerative changes. 8. Bibasilar atelectasis. 9. Cardiomegaly with sequential pacemaker in place. Significant coronary artery calcifications. Electronically Signed   By: Genia Del M.D.   On: 12/06/2017 17:49     Subjective: Eager to go home today  Discharge Exam: Vitals:   12/08/17 0747 12/08/17 1432  BP: (!) 175/92   Pulse: 99 95  Resp: 18   Temp: 98.5 F (36.9 C) (!) 97.5 F (36.4 C)  SpO2: 97%    Vitals:   12/08/17 0537 12/08/17 0747 12/08/17 1421 12/08/17 1432  BP: (!) 165/88 (!) 175/92    Pulse: (!) 101 99  95  Resp: 18 18    Temp: 98.6 F (37 C) 98.5 F (36.9 C)  (!) 97.5 F (36.4 C)  TempSrc: Oral Oral  Oral  SpO2: 95% 97%    Weight:   94.6 kg    Height:        General: Pt is alert, awake, not in acute distress Cardiovascular: RRR, S1/S2 +, no rubs, no gallops Respiratory: CTA bilaterally, no wheezing, no rhonchi Abdominal: Soft, NT, ND, bowel sounds + Extremities: no edema, no cyanosis   The results of significant diagnostics from this hospitalization (including imaging, microbiology, ancillary and laboratory) are listed below for reference.     Microbiology: Recent Results (from the past 240 hour(s))  Blood culture (routine x 2)     Status: None (Preliminary result)   Collection Time: 12/06/17  2:20 PM  Result Value Ref Range Status   Specimen Description BLOOD LEFT ANTECUBITAL  Final   Special Requests   Final    BOTTLES DRAWN AEROBIC AND ANAEROBIC Blood Culture adequate volume   Culture   Final    NO GROWTH 2 DAYS Performed at [facility], [address], [place], [place] [address]    Report Status PENDING  Incomplete  Blood culture (routine x 2)     Status: None (Preliminary result)   Collection Time: 12/06/17  2:47 PM  Result Value Ref Range Status   Specimen Description BLOOD RIGHT HAND  Final  Special Requests   Final    BOTTLES DRAWN AEROBIC AND ANAEROBIC Blood Culture adequate volume   Culture   Final    NO GROWTH 2 DAYS Performed at [facility], [place] [address], [place], [place] [address]    Report Status PENDING  Incomplete  MRSA PCR Screening     Status: None   Collection Time: 12/06/17 10:26 PM  Result Value Ref Range Status   MRSA by PCR NEGATIVE NEGATIVE Final    Comment:        The GeneXpert MRSA Assay (FDA approved for NASAL specimens only), is one component of a comprehensive MRSA colonization surveillance program. It is not intended to diagnose MRSA infection nor to guide or monitor treatment for MRSA infections. Performed at [facility], [place] [address] [place],  [address]      Labs: BNP (last 3 results) Recent Labs    04/17/17 1800 09/01/17 2101   BNP 2,812.2* 315.4*   Basic Metabolic Panel: Recent Labs  Lab 12/02/17 0616 12/03/17 0843 12/06/17 1419 12/06/17 2104 12/07/17 0522 12/08/17 0853  NA 140 139 138  --  137 135  K 4.1 4.6 6.1* 5.6* 6.4* 4.9  CL  --  97* 95*  --  96* 95*  CO2  --  27 26  --  26 27  GLUCOSE 94 110* 101*  --  83 86  BUN  --  43* 54*  --  58* 37*  CREATININE  --  9.94* 10.60*  --  11.14* 8.79*  CALCIUM  --  9.5 9.8  --  9.2 9.4  PHOS  --  9.1*  --   --  6.2*  --    Liver Function Tests: Recent Labs  Lab 12/03/17 0843 12/06/17 1419 12/07/17 0522  AST  --  26 17  ALT  --  7 6  ALKPHOS  --  49 46  BILITOT  --  0.9 0.6  PROT  --  6.7 5.8*  ALBUMIN 3.0* 3.3* 2.7*  2.6*   Recent Labs  Lab 12/06/17 1419  LIPASE 34   No results for input(s): AMMONIA in the last 168 hours. CBC: Recent Labs  Lab 12/02/17 0616 12/03/17 0843 12/06/17 1419 12/07/17 0522  WBC  --  9.6 14.0* 13.7*  NEUTROABS  --   --  11.5*  --   HGB 10.5* 8.7* 9.1* 9.0*  HCT 31.0* 28.4* 29.7* 29.9*  MCV  --  93.4 93.1 92.9  PLT  --  264 294 247   Cardiac Enzymes: No results for input(s): CKTOTAL, CKMB, CKMBINDEX, TROPONINI in the last 168 hours. BNP: Invalid input(s): POCBNP CBG: Recent Labs  Lab 12/02/17 0929 12/06/17 2054 12/07/17 2045 12/08/17 0750 12/08/17 1149  GLUCAP 120* 87 108* 84 120*   D-Dimer No results for input(s): DDIMER in the last 72 hours. Hgb A1c No results for input(s): HGBA1C in the last 72 hours. Lipid Profile No results for input(s): CHOL, HDL, LDLCALC, TRIG, CHOLHDL, LDLDIRECT in the last 72 hours. Thyroid function studies No results for input(s): TSH, T4TOTAL, T3FREE, THYROIDAB in the last 72 hours.  Invalid input(s): FREET3 Anemia work up No results for input(s): VITAMINB12, FOLATE, FERRITIN, TIBC, IRON, RETICCTPCT in the last 72 hours. Urinalysis    Component Value Date/Time   COLORURINE YELLOW 12/29/2014 0550   APPEARANCEUR CLOUDY (A) 12/29/2014 0550   LABSPEC 1.020  12/29/2014 0550   PHURINE 5.0 12/29/2014 0550   GLUCOSEU NEGATIVE 12/29/2014 0550   HGBUR SMALL (A)  12/29/2014 0550   [place] 12/29/2014 0550   KETONESUR NEGATIVE 12/29/2014 0550   PROTEINUR 100 (A) 12/29/2014 0550   UROBILINOGEN 0.2 12/29/2014 0550   NITRITE NEGATIVE 12/29/2014 0550   LEUKOCYTESUR SMALL (A) 12/29/2014 0550   Sepsis Labs Invalid input(s): PROCALCITONIN,  WBC,  LACTICIDVEN Microbiology Recent Results (from the past 240 hour(s))  Blood culture (routine x 2)     Status: None (Preliminary result)   Collection Time: 12/06/17  2:20 PM  Result Value Ref Range Status   Specimen Description BLOOD LEFT ANTECUBITAL  Final   Special Requests   Final    BOTTLES DRAWN AEROBIC AND ANAEROBIC Blood Culture adequate volume   Culture   Final    NO GROWTH 2 DAYS Performed at [facility], [address] [place], [place] [address]    Report Status PENDING  Incomplete  Blood culture (routine x 2)     Status: None (Preliminary result)   Collection Time: 12/06/17  2:47 PM  Result Value Ref Range Status   Specimen Description BLOOD RIGHT HAND  Final   Special Requests   Final    BOTTLES DRAWN AEROBIC AND ANAEROBIC Blood Culture adequate volume   Culture   Final    NO GROWTH 2 DAYS Performed at [facility], [place] [address], [place], [place] [address]    Report Status PENDING  Incomplete  MRSA PCR Screening     Status: None   Collection Time: 12/06/17 10:26 PM  Result Value Ref Range Status   MRSA by PCR NEGATIVE NEGATIVE Final    Comment:        The GeneXpert MRSA Assay (FDA approved for NASAL specimens only), is one component of a comprehensive MRSA colonization surveillance program. It is not intended to diagnose MRSA infection nor to guide or monitor treatment for MRSA infections. Performed at [facility], [place] [address], [place], [place] [address]    Time spent: 46min  SIGNED:   Marylu Lund, MD  [facility] 12/08/2017,  3:28 PM  If 7PM-7AM, please contact night-coverage

## 2017-12-11 LAB — CULTURE, BLOOD (ROUTINE X 2)
Culture: NO GROWTH
Culture: NO GROWTH
Special Requests: ADEQUATE
Special Requests: ADEQUATE

## 2018-02-08 ENCOUNTER — Ambulatory Visit (INDEPENDENT_AMBULATORY_CARE_PROVIDER_SITE_OTHER): Payer: Medicare Other

## 2018-02-08 DIAGNOSIS — I459 Conduction disorder, unspecified: Secondary | ICD-10-CM

## 2018-02-10 ENCOUNTER — Emergency Department (HOSPITAL_COMMUNITY): Payer: Medicare Other

## 2018-02-10 ENCOUNTER — Observation Stay (HOSPITAL_BASED_OUTPATIENT_CLINIC_OR_DEPARTMENT_OTHER): Payer: Medicare Other

## 2018-02-10 ENCOUNTER — Encounter (HOSPITAL_COMMUNITY): Payer: Self-pay | Admitting: *Deleted

## 2018-02-10 ENCOUNTER — Inpatient Hospital Stay (HOSPITAL_COMMUNITY)
Admission: EM | Admit: 2018-02-10 | Discharge: 2018-02-13 | DRG: 246 | Disposition: A | Discharge status: Home / Self Care | Payer: Medicare Other | Attending: Internal Medicine | Admitting: Internal Medicine

## 2018-02-10 ENCOUNTER — Other Ambulatory Visit: Payer: Self-pay

## 2018-02-10 DIAGNOSIS — R778 Other specified abnormalities of plasma proteins: Secondary | ICD-10-CM | POA: Diagnosis present

## 2018-02-10 DIAGNOSIS — N186 End stage renal disease: Secondary | ICD-10-CM | POA: Diagnosis not present

## 2018-02-10 DIAGNOSIS — Z8546 Personal history of malignant neoplasm of prostate: Secondary | ICD-10-CM

## 2018-02-10 DIAGNOSIS — R059 Cough, unspecified: Secondary | ICD-10-CM

## 2018-02-10 DIAGNOSIS — R7989 Other specified abnormal findings of blood chemistry: Secondary | ICD-10-CM | POA: Diagnosis not present

## 2018-02-10 DIAGNOSIS — M109 Gout, unspecified: Secondary | ICD-10-CM | POA: Diagnosis present

## 2018-02-10 DIAGNOSIS — Z992 Dependence on renal dialysis: Secondary | ICD-10-CM

## 2018-02-10 DIAGNOSIS — I214 Non-ST elevation (NSTEMI) myocardial infarction: Secondary | ICD-10-CM | POA: Diagnosis not present

## 2018-02-10 DIAGNOSIS — I25110 Atherosclerotic heart disease of native coronary artery with unstable angina pectoris: Secondary | ICD-10-CM | POA: Diagnosis present

## 2018-02-10 DIAGNOSIS — I714 Abdominal aortic aneurysm, without rupture: Secondary | ICD-10-CM | POA: Diagnosis present

## 2018-02-10 DIAGNOSIS — I2584 Coronary atherosclerosis due to calcified coronary lesion: Secondary | ICD-10-CM | POA: Diagnosis present

## 2018-02-10 DIAGNOSIS — R05 Cough: Secondary | ICD-10-CM

## 2018-02-10 DIAGNOSIS — Z79899 Other long term (current) drug therapy: Secondary | ICD-10-CM

## 2018-02-10 DIAGNOSIS — I340 Nonrheumatic mitral (valve) insufficiency: Secondary | ICD-10-CM

## 2018-02-10 DIAGNOSIS — Z888 Allergy status to other drugs, medicaments and biological substances status: Secondary | ICD-10-CM

## 2018-02-10 DIAGNOSIS — M170 Bilateral primary osteoarthritis of knee: Secondary | ICD-10-CM | POA: Diagnosis present

## 2018-02-10 DIAGNOSIS — Z950 Presence of cardiac pacemaker: Secondary | ICD-10-CM

## 2018-02-10 DIAGNOSIS — K219 Gastro-esophageal reflux disease without esophagitis: Secondary | ICD-10-CM | POA: Diagnosis present

## 2018-02-10 DIAGNOSIS — I350 Nonrheumatic aortic (valve) stenosis: Secondary | ICD-10-CM

## 2018-02-10 DIAGNOSIS — N2581 Secondary hyperparathyroidism of renal origin: Secondary | ICD-10-CM | POA: Diagnosis present

## 2018-02-10 DIAGNOSIS — Z8249 Family history of ischemic heart disease and other diseases of the circulatory system: Secondary | ICD-10-CM

## 2018-02-10 DIAGNOSIS — J810 Acute pulmonary edema: Secondary | ICD-10-CM

## 2018-02-10 DIAGNOSIS — G8929 Other chronic pain: Secondary | ICD-10-CM | POA: Diagnosis present

## 2018-02-10 DIAGNOSIS — Z8673 Personal history of transient ischemic attack (TIA), and cerebral infarction without residual deficits: Secondary | ICD-10-CM

## 2018-02-10 DIAGNOSIS — Z6834 Body mass index (BMI) 34.0-34.9, adult: Secondary | ICD-10-CM

## 2018-02-10 DIAGNOSIS — E785 Hyperlipidemia, unspecified: Secondary | ICD-10-CM | POA: Diagnosis present

## 2018-02-10 DIAGNOSIS — Z9115 Patient's noncompliance with renal dialysis: Secondary | ICD-10-CM

## 2018-02-10 DIAGNOSIS — I272 Pulmonary hypertension, unspecified: Secondary | ICD-10-CM | POA: Diagnosis present

## 2018-02-10 DIAGNOSIS — R079 Chest pain, unspecified: Secondary | ICD-10-CM | POA: Diagnosis not present

## 2018-02-10 DIAGNOSIS — E1129 Type 2 diabetes mellitus with other diabetic kidney complication: Secondary | ICD-10-CM | POA: Diagnosis present

## 2018-02-10 DIAGNOSIS — Z9841 Cataract extraction status, right eye: Secondary | ICD-10-CM

## 2018-02-10 DIAGNOSIS — D631 Anemia in chronic kidney disease: Secondary | ICD-10-CM | POA: Diagnosis present

## 2018-02-10 DIAGNOSIS — Z955 Presence of coronary angioplasty implant and graft: Secondary | ICD-10-CM

## 2018-02-10 DIAGNOSIS — H5462 Unqualified visual loss, left eye, normal vision right eye: Secondary | ICD-10-CM | POA: Diagnosis present

## 2018-02-10 DIAGNOSIS — E1151 Type 2 diabetes mellitus with diabetic peripheral angiopathy without gangrene: Secondary | ICD-10-CM | POA: Diagnosis present

## 2018-02-10 DIAGNOSIS — I082 Rheumatic disorders of both aortic and tricuspid valves: Secondary | ICD-10-CM | POA: Diagnosis present

## 2018-02-10 DIAGNOSIS — I509 Heart failure, unspecified: Secondary | ICD-10-CM | POA: Diagnosis present

## 2018-02-10 DIAGNOSIS — E669 Obesity, unspecified: Secondary | ICD-10-CM | POA: Diagnosis present

## 2018-02-10 DIAGNOSIS — Z791 Long term (current) use of non-steroidal anti-inflammatories (NSAID): Secondary | ICD-10-CM

## 2018-02-10 DIAGNOSIS — I447 Left bundle-branch block, unspecified: Secondary | ICD-10-CM | POA: Diagnosis present

## 2018-02-10 DIAGNOSIS — J449 Chronic obstructive pulmonary disease, unspecified: Secondary | ICD-10-CM | POA: Diagnosis present

## 2018-02-10 DIAGNOSIS — D649 Anemia, unspecified: Secondary | ICD-10-CM | POA: Diagnosis present

## 2018-02-10 DIAGNOSIS — E1122 Type 2 diabetes mellitus with diabetic chronic kidney disease: Secondary | ICD-10-CM | POA: Diagnosis present

## 2018-02-10 DIAGNOSIS — I10 Essential (primary) hypertension: Secondary | ICD-10-CM | POA: Diagnosis present

## 2018-02-10 DIAGNOSIS — I132 Hypertensive heart and chronic kidney disease with heart failure and with stage 5 chronic kidney disease, or end stage renal disease: Secondary | ICD-10-CM | POA: Diagnosis present

## 2018-02-10 DIAGNOSIS — Z9049 Acquired absence of other specified parts of digestive tract: Secondary | ICD-10-CM

## 2018-02-10 LAB — I-STAT TROPONIN, ED: Troponin i, poc: 0.11 ng/mL (ref 0.00–0.08)

## 2018-02-10 LAB — TROPONIN I: Troponin I: 0.11 ng/mL (ref <0.03)

## 2018-02-10 LAB — I-STAT CHEM 8, ED
BUN: 52 mg/dL — ABNORMAL HIGH (ref 8–23)
CALCIUM ION: 1.03 mmol/L — AB (ref 1.15–1.40)
Chloride: 103 mmol/L (ref 98–111)
Creatinine, Ser: 12.3 mg/dL — ABNORMAL HIGH (ref 0.61–1.24)
GLUCOSE: 104 mg/dL — AB (ref 70–99)
HEMATOCRIT: 36 % — AB (ref 39.0–52.0)
HEMOGLOBIN: 12.2 g/dL — AB (ref 13.0–17.0)
POTASSIUM: 4.6 mmol/L (ref 3.5–5.1)
SODIUM: 138 mmol/L (ref 135–145)
TCO2: 26 mmol/L (ref 22–32)

## 2018-02-10 LAB — CBC WITH DIFFERENTIAL/PLATELET
Abs Immature Granulocytes: 0.06 10*3/uL (ref 0.00–0.07)
BASOS ABS: 0 10*3/uL (ref 0.0–0.1)
BASOS PCT: 1 %
EOS PCT: 4 %
Eosinophils Absolute: 0.4 10*3/uL (ref 0.0–0.5)
HEMATOCRIT: 37.7 % — AB (ref 39.0–52.0)
Hemoglobin: 10.9 g/dL — ABNORMAL LOW (ref 13.0–17.0)
Immature Granulocytes: 1 %
Lymphocytes Relative: 17 %
Lymphs Abs: 1.4 10*3/uL (ref 0.7–4.0)
MCH: 25.4 pg — ABNORMAL LOW (ref 26.0–34.0)
MCHC: 28.9 g/dL — ABNORMAL LOW (ref 30.0–36.0)
MCV: 87.9 fL (ref 80.0–100.0)
Monocytes Absolute: 0.7 10*3/uL (ref 0.1–1.0)
Monocytes Relative: 8 %
Neutro Abs: 5.8 10*3/uL (ref 1.7–7.7)
Neutrophils Relative %: 69 %
PLATELETS: 293 10*3/uL (ref 150–400)
RBC: 4.29 MIL/uL (ref 4.22–5.81)
RDW: 17.9 % — AB (ref 11.5–15.5)
WBC: 8.3 10*3/uL (ref 4.0–10.5)
nRBC: 0 % (ref 0.0–0.2)

## 2018-02-10 LAB — BASIC METABOLIC PANEL
Anion gap: 18 — ABNORMAL HIGH (ref 5–15)
BUN: 57 mg/dL — ABNORMAL HIGH (ref 8–23)
CALCIUM: 9 mg/dL (ref 8.9–10.3)
CO2: 24 mmol/L (ref 22–32)
Chloride: 98 mmol/L (ref 98–111)
Creatinine, Ser: 11.46 mg/dL — ABNORMAL HIGH (ref 0.61–1.24)
GFR calc Af Amer: 4 mL/min — ABNORMAL LOW (ref >60)
GFR calc non Af Amer: 4 mL/min — ABNORMAL LOW (ref >60)
Glucose, Bld: 108 mg/dL — ABNORMAL HIGH (ref 70–99)
Potassium: 4.7 mmol/L (ref 3.5–5.1)
Sodium: 140 mmol/L (ref 135–145)

## 2018-02-10 LAB — ECHOCARDIOGRAM COMPLETE: Weight: 3340.41 oz

## 2018-02-10 LAB — PROTIME-INR
INR: 1.1
Prothrombin Time: 14.1 seconds (ref 11.4–15.2)

## 2018-02-10 LAB — BRAIN NATRIURETIC PEPTIDE: B NATRIURETIC PEPTIDE 5: 922.6 pg/mL — AB (ref 0.0–100.0)

## 2018-02-10 LAB — GLUCOSE, CAPILLARY
Glucose-Capillary: 100 mg/dL — ABNORMAL HIGH (ref 70–99)
Glucose-Capillary: 84 mg/dL (ref 70–99)

## 2018-02-10 MED ORDER — CINACALCET HCL 30 MG PO TABS
150.0000 mg | ORAL_TABLET | ORAL | Status: DC
  Administered 2018-02-11: 150 mg via ORAL
  Filled 2018-02-10: qty 5

## 2018-02-10 MED ORDER — HEPARIN SODIUM (PORCINE) 1000 UNIT/ML DIALYSIS
20.0000 [IU]/kg | INTRAMUSCULAR | Status: DC | PRN
  Filled 2018-02-10: qty 2

## 2018-02-10 MED ORDER — ALTEPLASE 2 MG IJ SOLR: 2.0000 mg | Freq: Once | INTRAMUSCULAR | Status: DC | PRN

## 2018-02-10 MED ORDER — LIDOCAINE HCL (PF) 1 % IJ SOLN: 5.0000 mL | INTRAMUSCULAR | Status: DC | PRN

## 2018-02-10 MED ORDER — GABAPENTIN 100 MG PO CAPS
100.0000 mg | ORAL_CAPSULE | Freq: Every day | ORAL | Status: DC
  Administered 2018-02-10 – 2018-02-12 (×3): 100 mg via ORAL
  Filled 2018-02-10 (×3): qty 1

## 2018-02-10 MED ORDER — CALCIUM ACETATE (PHOS BINDER) 667 MG PO CAPS: 1334.0000 mg | ORAL_CAPSULE | ORAL | Status: DC | PRN

## 2018-02-10 MED ORDER — SODIUM CHLORIDE 0.9% FLUSH
3.0000 mL | Freq: Two times a day (BID) | INTRAVENOUS | Status: DC
  Administered 2018-02-11 – 2018-02-12 (×2): 3 mL via INTRAVENOUS

## 2018-02-10 MED ORDER — ALBUTEROL SULFATE (2.5 MG/3ML) 0.083% IN NEBU: 2.5000 mg | INHALATION_SOLUTION | Freq: Four times a day (QID) | RESPIRATORY_TRACT | Status: DC

## 2018-02-10 MED ORDER — HEPARIN SODIUM (PORCINE) 5000 UNIT/ML IJ SOLN: 5000.0000 [IU] | Freq: Three times a day (TID) | INTRAMUSCULAR | Status: DC

## 2018-02-10 MED ORDER — ONDANSETRON HCL 4 MG/2ML IJ SOLN: 4.0000 mg | Freq: Four times a day (QID) | INTRAMUSCULAR | Status: DC | PRN

## 2018-02-10 MED ORDER — SEVELAMER CARBONATE 2.4 G PO PACK
7.2000 g | PACK | Freq: Three times a day (TID) | ORAL | Status: DC
  Administered 2018-02-10: 7.2 g via ORAL
  Filled 2018-02-10 (×10): qty 3

## 2018-02-10 MED ORDER — SODIUM CHLORIDE 0.9 % IV SOLN: 250.0000 mL | INTRAVENOUS | Status: DC | PRN

## 2018-02-10 MED ORDER — VITAMIN D 25 MCG (1000 UNIT) PO TABS
1000.0000 [IU] | ORAL_TABLET | Freq: Every day | ORAL | Status: DC
  Administered 2018-02-10 – 2018-02-13 (×4): 1000 [IU] via ORAL
  Filled 2018-02-10 (×4): qty 1

## 2018-02-10 MED ORDER — SODIUM CHLORIDE 0.9% FLUSH: 3.0000 mL | INTRAVENOUS | Status: DC | PRN

## 2018-02-10 MED ORDER — CALCIUM CARBONATE ANTACID 500 MG PO CHEW
4.0000 | CHEWABLE_TABLET | Freq: Two times a day (BID) | ORAL | Status: DC
  Administered 2018-02-10 – 2018-02-13 (×6): 800 mg via ORAL
  Filled 2018-02-10 (×6): qty 4

## 2018-02-10 MED ORDER — ALLOPURINOL 100 MG PO TABS
100.0000 mg | ORAL_TABLET | Freq: Every day | ORAL | Status: DC
  Administered 2018-02-10 – 2018-02-13 (×4): 100 mg via ORAL
  Filled 2018-02-10 (×4): qty 1

## 2018-02-10 MED ORDER — LISINOPRIL 20 MG PO TABS
20.0000 mg | ORAL_TABLET | Freq: Every day | ORAL | Status: DC
  Administered 2018-02-10 – 2018-02-12 (×3): 20 mg via ORAL
  Filled 2018-02-10: qty 2
  Filled 2018-02-10 (×2): qty 1

## 2018-02-10 MED ORDER — HEPARIN SODIUM (PORCINE) 5000 UNIT/ML IJ SOLN
5000.0000 [IU] | Freq: Three times a day (TID) | INTRAMUSCULAR | Status: DC
  Administered 2018-02-10 – 2018-02-13 (×6): 5000 [IU] via SUBCUTANEOUS
  Filled 2018-02-10 (×6): qty 1

## 2018-02-10 MED ORDER — IOPAMIDOL (ISOVUE-370) INJECTION 76%
INTRAVENOUS | Status: AC
  Administered 2018-02-10: 100 mL via INTRAVENOUS
  Filled 2018-02-10: qty 100

## 2018-02-10 MED ORDER — ATORVASTATIN CALCIUM 40 MG PO TABS
40.0000 mg | ORAL_TABLET | Freq: Every evening | ORAL | Status: DC
  Administered 2018-02-10 – 2018-02-12 (×3): 40 mg via ORAL
  Filled 2018-02-10 (×3): qty 1

## 2018-02-10 MED ORDER — SUCROFERRIC OXYHYDROXIDE 500 MG PO CHEW
500.0000 mg | CHEWABLE_TABLET | Freq: Three times a day (TID) | ORAL | Status: DC | PRN
  Filled 2018-02-10: qty 1

## 2018-02-10 MED ORDER — CALCIUM ACETATE (PHOS BINDER) 667 MG PO CAPS
2001.0000 mg | ORAL_CAPSULE | Freq: Three times a day (TID) | ORAL | Status: DC
  Administered 2018-02-10 – 2018-02-13 (×6): 2001 mg via ORAL
  Filled 2018-02-10 (×7): qty 3

## 2018-02-10 MED ORDER — LIDOCAINE-PRILOCAINE 2.5-2.5 % EX CREA
1.0000 "application " | TOPICAL_CREAM | CUTANEOUS | Status: DC | PRN
  Filled 2018-02-10: qty 5

## 2018-02-10 MED ORDER — SODIUM CHLORIDE 0.9 % IV SOLN: 100.0000 mL | INTRAVENOUS | Status: DC | PRN

## 2018-02-10 MED ORDER — CHLORHEXIDINE GLUCONATE CLOTH 2 % EX PADS
6.0000 | MEDICATED_PAD | Freq: Every day | CUTANEOUS | Status: DC
  Administered 2018-02-11 – 2018-02-13 (×3): 6 via TOPICAL

## 2018-02-10 MED ORDER — CALCITRIOL 0.25 MCG PO CAPS
0.5000 ug | ORAL_CAPSULE | ORAL | Status: DC
  Administered 2018-02-11 – 2018-02-12 (×2): 0.5 ug via ORAL
  Filled 2018-02-10: qty 1

## 2018-02-10 MED ORDER — BUDESONIDE 0.5 MG/2ML IN SUSP
0.5000 mg | Freq: Two times a day (BID) | RESPIRATORY_TRACT | Status: DC
  Administered 2018-02-11 – 2018-02-13 (×3): 0.5 mg via RESPIRATORY_TRACT
  Filled 2018-02-10 (×6): qty 2

## 2018-02-10 MED ORDER — ISOSORBIDE MONONITRATE ER 30 MG PO TB24
30.0000 mg | ORAL_TABLET | Freq: Every day | ORAL | Status: DC
  Administered 2018-02-10 – 2018-02-13 (×4): 30 mg via ORAL
  Filled 2018-02-10 (×4): qty 1

## 2018-02-10 MED ORDER — HEPARIN SODIUM (PORCINE) 1000 UNIT/ML DIALYSIS
1000.0000 [IU] | INTRAMUSCULAR | Status: DC | PRN
  Filled 2018-02-10: qty 1

## 2018-02-10 MED ORDER — ACETAMINOPHEN 325 MG PO TABS
650.0000 mg | ORAL_TABLET | Freq: Four times a day (QID) | ORAL | Status: DC | PRN
  Administered 2018-02-11 – 2018-02-12 (×2): 650 mg via ORAL
  Filled 2018-02-10 (×2): qty 2

## 2018-02-10 MED ORDER — LEVALBUTEROL HCL 1.25 MG/0.5ML IN NEBU
1.2500 mg | INHALATION_SOLUTION | Freq: Four times a day (QID) | RESPIRATORY_TRACT | Status: DC
  Administered 2018-02-11: 1.25 mg via RESPIRATORY_TRACT
  Filled 2018-02-10: qty 0.5

## 2018-02-10 MED ORDER — HYDRALAZINE HCL 20 MG/ML IJ SOLN
20.0000 mg | INTRAMUSCULAR | Status: DC | PRN
  Administered 2018-02-10 – 2018-02-11 (×2): 20 mg via INTRAVENOUS
  Administered 2018-02-12: 10 mg via INTRAVENOUS
  Filled 2018-02-10 (×2): qty 1

## 2018-02-10 MED ORDER — PANTOPRAZOLE SODIUM 40 MG PO TBEC
40.0000 mg | DELAYED_RELEASE_TABLET | Freq: Two times a day (BID) | ORAL | Status: DC
  Administered 2018-02-10 – 2018-02-13 (×6): 40 mg via ORAL
  Filled 2018-02-10 (×6): qty 1

## 2018-02-10 MED ORDER — IPRATROPIUM BROMIDE 0.02 % IN SOLN
0.5000 mg | Freq: Four times a day (QID) | RESPIRATORY_TRACT | Status: DC
  Administered 2018-02-11: 0.5 mg via RESPIRATORY_TRACT
  Filled 2018-02-10: qty 2.5

## 2018-02-10 MED ORDER — AMLODIPINE BESYLATE 10 MG PO TABS
10.0000 mg | ORAL_TABLET | Freq: Every day | ORAL | Status: DC
  Administered 2018-02-10 – 2018-02-12 (×3): 10 mg via ORAL
  Filled 2018-02-10 (×3): qty 1

## 2018-02-10 MED ORDER — ALBUTEROL SULFATE (2.5 MG/3ML) 0.083% IN NEBU: 1.2500 mg | INHALATION_SOLUTION | Freq: Four times a day (QID) | RESPIRATORY_TRACT | Status: DC

## 2018-02-10 MED ORDER — PENTAFLUOROPROP-TETRAFLUOROETH EX AERO
1.0000 "application " | INHALATION_SPRAY | CUTANEOUS | Status: DC | PRN
  Filled 2018-02-10: qty 116

## 2018-02-10 NOTE — Progress Notes (Signed)
  Echocardiogram 2D Echocardiogram has been performed.  Johny Chess 02/10/2018, 4:52 PM

## 2018-02-10 NOTE — Progress Notes (Signed)
Remote pacemaker transmission.   

## 2018-02-10 NOTE — Consult Note (Addendum)
[facility] Renal Consultation Note    Indication for Consultation:  Management of ESRD/hemodialysis, anemia, hypertension/volume, and secondary hyperparathyroidism. PCP:  HPI: Sean Best is a 84 y.o. male with ESRD, HTN, Type 2 DM, hyperlipidemia, CAD, Hx CVA, Hx pacemaker, Hx prostate cancer who was admitted for dyspnea/pulm edema.   Presented to ED after waking this morning and havign dyspnea and chest pressure. He is getting over recent URI v. Flu last week. Denies current cough, abd pain, N/V, diarrhea, fever or chills. In ED, vitals were ok except slightly tachycardia. He was placed on nasal oxygen. Labs showed K 4.6, Ca 9, WBC 8.3, Hgb 12.2, BNP high at 922. EKG with ?new LBBB per read, but seems stable from last EKG. CXR consistent with pulm edema/CHF. CT angio without PE present, confirmed pulm edema. Trop 0.11.  From renal standpoint, he dialyzes on MWF schedule at [facility]. He missed his last HD (wed) and cut his treatment short on Monday 12/9 (only 3 hours completed, left 1.5kg up). No recent AVF issues.  Past Medical History:  Diagnosis Date  . Anemia   . Arthritis    "knees" (04/11/2016)  . Blind left eye    S/P trauma  . Chronic total occlusion of artery of the extremities (HCC)    pt not aware of this  . Claudication ([place])   . ESRD on dialysis [facility])    "MWF; [name]" ((04/10/2016)  . ESRD on peritoneal dialysis [facility])    Started dialysis around April 2015 per son.  Has been doing peritoneal dialysis at home.     . Gout   . Hyperlipidemia   . Hypertension   . Overweight(278.02)   . Presence of permanent cardiac pacemaker    medtronic  . Prostate cancer ([place]) 1990s  . Shingles   . Stroke [facility]) ~1998   denies residual on 04/11/2016  . Type II diabetes mellitus (HCC)    diet controlled  . Umbilical hernia 2/75/1700   Past Surgical History:  Procedure Laterality Date  . AV FISTULA PLACEMENT, RADIOCEPHALIC  17/49/4496   Left arm  .  CAPD INSERTION N/A 03/06/2013   Procedure: LAPAROSCOPIC INSERTION CONTINUOUS AMBULATORY PERITONEAL DIALYSIS  (CAPD) CATHETER;  Surgeon: Edward Jolly, MD;  Location: [place];  Service: General;  Laterality: N/A;  . CATARACT EXTRACTION Right   . CHOLECYSTECTOMY  12/02/2017   LAPROSCOPIC  . EYE SURGERY Left 1970s   for eye injury  . HERNIA REPAIR    . INSERTION PROSTATE RADIATION SEED    . LAPAROSCOPIC CHOLECYSTECTOMY SINGLE SITE WITH INTRAOPERATIVE CHOLANGIOGRAM N/A 12/02/2017   Procedure: LAPAROSCOPIC CHOLECYSTECTOMY;  Surgeon: Michael Boston, MD;  Location: [place];  Service: General;  Laterality: N/A;  . PACEMAKER IMPLANT N/A 04/20/2017   Procedure: PACEMAKER IMPLANT;  Surgeon: Evans Lance, MD;  Location: [place] CV [facility];  Service: Cardiovascular;  Laterality: N/A;  . UMBILICAL HERNIA REPAIR N/A 03/06/2013   Procedure: HERNIA REPAIR UMBILICAL WITH MESH;  Surgeon: Edward Jolly, MD;  Location: [facility];  Service: General;  Laterality: N/A;   Family History  Problem Relation Age of Onset  . Cancer Mother   . Heart disease Mother   . Cancer Father    Social History:  reports that he has never smoked. He has never used smokeless tobacco. He reports that he does not drink alcohol or use drugs.  ROS: As per HPI otherwise negative.  Physical Exam: Vitals:   02/10/18 1030 02/10/18 1045 02/10/18 1048 02/10/18 1112  BP: (!) 167/95 (!) 163/103    Pulse: 97 95 (!) 111   Resp: 17 15 15    Temp:      TempSrc:      SpO2: 94%  100%   Weight:    94.7 kg     General: Well developed, in no acute distress. Diaphoretic appearing, sweaty across face/chest Head: Normocephalic, atraumatic, sclera non-icteric, mucus membranes are moist. Neck: Supple without lymphadenopathy/masses. JVD not elevated. Lungs: Coarse air movement throughout, scattered rhonchi Heart: RRR with normal S1, S2. No murmurs, rubs, or gallops appreciated. Abdomen: Soft, non-tender, non-distended with normoactive bowel  sounds. No rebound/guarding. Musculoskeletal:  Strength and tone appear normal for age. Lower extremities: No edema or ischemic changes, no open wounds. Neuro: Alert and oriented X 3. Moves all extremities spontaneously. Psych:  Responds to questions appropriately with a normal affect. Dialysis Access: L forearm AVF + thrill  Allergies  Allergen Reactions  . Cardura [Doxazosin Mesylate] Other (See Comments)    Hallucinations   Prior to Admission medications   Medication Sig Start Date End Date Taking? Authorizing Provider  allopurinol (ZYLOPRIM) 100 MG tablet Take 100 mg by mouth daily.     Yes [provider]  amLODipine (NORVASC) 10 MG tablet Take 10 mg by mouth at bedtime.  01/04/17  Yes [provider]  atorvastatin (LIPITOR) 40 MG tablet Take 40 mg by mouth every evening.  07/07/17  Yes [provider]  calcium acetate (PHOSLO) 667 MG capsule Take 1,334-2,001 mg by mouth See admin instructions. Take 2001 mg by mouth 3 times daily with meals and take 1344 mg by mouth with snacks   Yes [provider]  calcium carbonate (TUMS EX) 750 MG chewable tablet Chew 4 tablets by mouth 2 (two) times daily.   Yes [provider]  Cholecalciferol (VITAMIN D-3) 1000 units CAPS Take 1,000 Units by mouth daily.   Yes [provider]  gabapentin (NEURONTIN) 100 MG capsule Take 100 mg by mouth at bedtime. 12/27/16  Yes [provider]  lidocaine-prilocaine (EMLA) cream Apply 1 application topically as needed (for port access).  10/28/17  Yes [provider]  lisinopril (PRINIVIL,ZESTRIL) 20 MG tablet Take 20 mg by mouth at bedtime. 02/04/18  Yes [provider]  multivitamin (RENA-VIT) TABS tablet Take 1 tablet by mouth daily.   Yes [provider]  naproxen sodium (ALEVE) 220 MG tablet Take 440 mg by mouth at bedtime.   Yes [provider]  pantoprazole (PROTONIX) 40 MG tablet Take 1 tablet (40 mg total) by mouth  2 (two) times daily. 12/08/17 02/10/18 Yes Donne Hazel, MD  sevelamer carbonate (RENVELA) 2.4 g PACK Take 7.2-9.6 g by mouth 3 (three) times daily with meals.  10/13/17  Yes [provider]  VELPHORO 500 MG chewable tablet Chew 500 mg by mouth 3 (three) times daily as needed (when not taking the phoslo).  01/28/18  Yes [provider]  polyethylene glycol (MIRALAX) packet Take 17 g by mouth daily. Patient not taking: Reported on 02/10/2018 12/08/17   Donne Hazel, MD  traMADol (ULTRAM) 50 MG tablet Take 1 tablet (50 mg total) by mouth every 6 (six) hours as needed for moderate pain or severe pain. Patient not taking: Reported on 02/10/2018 12/02/17   Michael Boston, MD   Current Facility-Administered Medications  Medication Dose Route Frequency Provider Last Rate Last Dose  . [START ON 02/11/2018] Chlorhexidine Gluconate Cloth 2 % PADS 6 each  6 each Topical Q0600 [place],  Woodfin Ganja, PA-C       Current Outpatient Medications  Medication Sig Dispense Refill  . allopurinol (ZYLOPRIM) 100 MG tablet Take 100 mg by mouth daily.      Marland Kitchen amLODipine (NORVASC) 10 MG tablet Take 10 mg by mouth at bedtime.   9  . atorvastatin (LIPITOR) 40 MG tablet Take 40 mg by mouth every evening.   3  . calcium acetate (PHOSLO) 667 MG capsule Take 1,334-2,001 mg by mouth See admin instructions. Take 2001 mg by mouth 3 times daily with meals and take 1344 mg by mouth with snacks    . calcium carbonate (TUMS EX) 750 MG chewable tablet Chew 4 tablets by mouth 2 (two) times daily.    . Cholecalciferol (VITAMIN D-3) 1000 units CAPS Take 1,000 Units by mouth daily.    Marland Kitchen gabapentin (NEURONTIN) 100 MG capsule Take 100 mg by mouth at bedtime.  10  . lidocaine-prilocaine (EMLA) cream Apply 1 application topically as needed (for port access).   11  . lisinopril (PRINIVIL,ZESTRIL) 20 MG tablet Take 20 mg by mouth at bedtime.    . multivitamin (RENA-VIT) TABS tablet Take 1 tablet by mouth daily.    . naproxen  sodium (ALEVE) 220 MG tablet Take 440 mg by mouth at bedtime.    . pantoprazole (PROTONIX) 40 MG tablet Take 1 tablet (40 mg total) by mouth 2 (two) times daily. 60 tablet 0  . sevelamer carbonate (RENVELA) 2.4 g PACK Take 7.2-9.6 g by mouth 3 (three) times daily with meals.   3  . VELPHORO 500 MG chewable tablet Chew 500 mg by mouth 3 (three) times daily as needed (when not taking the phoslo).   3  . polyethylene glycol (MIRALAX) packet Take 17 g by mouth daily. (Patient not taking: Reported on 02/10/2018) 14 each 0  . traMADol (ULTRAM) 50 MG tablet Take 1 tablet (50 mg total) by mouth every 6 (six) hours as needed for moderate pain or severe pain. (Patient not taking: Reported on 02/10/2018) 20 tablet 0   Labs: Basic Metabolic Panel: Recent Labs  Lab 02/10/18 0909 02/10/18 0912  NA 140 138  K 4.7 4.6  CL 98 103  CO2 24  --   GLUCOSE 108* 104*  BUN 57* 52*  CREATININE 11.46* 12.30*  CALCIUM 9.0  --    CBC: Recent Labs  Lab 02/10/18 0909 02/10/18 0912  WBC 8.3  --   NEUTROABS 5.8  --   HGB 10.9* 12.2*  HCT 37.7* 36.0*  MCV 87.9  --   PLT 293  --    Studies/Results: Ct Angio Chest Pe W/cm &/or Wo Cm  Result Date: 02/10/2018 CLINICAL DATA:  Chest pain, shortness of Breath EXAM: CT ANGIOGRAPHY CHEST WITH CONTRAST TECHNIQUE: Multidetector CT imaging of the chest was performed using the standard protocol during bolus administration of intravenous contrast. Multiplanar CT image reconstructions and MIPs were obtained to evaluate the vascular anatomy. CONTRAST:  100 mL Isovue 370 IV ISOVUE-370 IOPAMIDOL (ISOVUE-370) INJECTION 76% COMPARISON:  05/15/2016 FINDINGS: Cardiovascular: Pacer wires noted in the right heart. Diffuse coronary artery calcifications. Moderate aortic calcifications. No aneurysm. Heart is mildly enlarged. No filling defects in the pulmonary arteries to suggest pulmonary emboli. Mediastinum/Nodes: No mediastinal, hilar, or axillary adenopathy. Lungs/Pleura: Small  bilateral effusions. Bilateral lower lobe airspace opacities with ground-glass opacities also noted in the inferior upper lobes. Findings likely reflect early edema and/or atelectasis. Upper Abdomen: Imaging into the upper abdomen shows no acute findings. Musculoskeletal: Bilateral gynecomastia.  No acute bony abnormality. Review of the MIP images confirms the above findings. IMPRESSION: No evidence of pulmonary embolus. Cardiomegaly. Bilateral ground-glass opacities and lower lobe airspace opacities most likely edema. Some of the bibasilar opacities could reflect atelectasis. Small effusions. Diffuse coronary artery disease. Aortic Atherosclerosis (ICD10-I70.0). Electronically Signed   By: Rolm Baptise M.D.   On: 02/10/2018 11:13   Dg Chest Port 1 View  Result Date: 02/10/2018 CLINICAL DATA:  One-week history of cough and chest congestion and acute shortness of breath that began earlier today. Current history of diabetes, hypertension and end-stage renal disease on hemodialysis. EXAM: PORTABLE CHEST 1 VIEW COMPARISON:  12/06/2017 and earlier. FINDINGS: Cardiac silhouette moderately enlarged, unchanged. RIGHT subclavian dual lead transvenous pacemaker unchanged. Interval development of mild diffuse interstitial pulmonary edema. No confluent airspace consolidation. Possible small BILATERAL pleural effusions. IMPRESSION: Mild CHF, with stable moderate cardiomegaly and mild diffuse interstitial pulmonary edema. Possible small BILATERAL effusions. Electronically Signed   By: Evangeline Dakin M.D.   On: 02/10/2018 09:31    Dialysis Orders:  MWF at [place], 450/A2.0, EDW 89.5kg, 2K/2Ca, AVF, heparin 6000 bolus - Mircera 117mcg IV q 2 weeks (?last unclear) - Sensipar 150mg  PO q HD - Calcitriol 0.40mcg PO q HD  Assessment/Plan: 1.  Dyspnea/Pulm edema: In setting of missed HD and recent URI. For HD today to correct. 2.  ESRD: Usual MWF sched. Short HD today, as above, then back to usual MWF  sched. 3.  Hypertension/volume: BP ^ with volume excess, will improve with HD. 4.  Anemia: Hgb 12.2, not due for ESA yet. 5.  Metabolic bone disease: Ca ok, Phos pending. Continue home binders, will resume sensipar/VDRA with HD. 6.  Type 2 DM 7.  CAD: Trop 0.11 - up from baseline. ?Demand ischemia. Per primary. 8.  Hx pacemaker 9.  Hx CVA  Pollyann Kennedy 02/10/2018, 12:09 PM  [facility] Pager: [phone]  Pt seen, examined and agree w A/P as above.  Kelly Splinter MD [employer] pager [phone]   02/10/2018, 12:33 PM

## 2018-02-10 NOTE — Progress Notes (Addendum)
HD tx initiated via 15G x 2 w/o problem Pull/push/flush well w/o problem VSS, although primary did admin bp meds not long before I called report for pt to come to HD, apparently was unaware to hold antihypertensives prior to HD tx or didn't see the pre HD tx set on the worklist Will continue to monitor while on HD tx

## 2018-02-10 NOTE — ED Triage Notes (Signed)
Per EMS- pt daughter called and reported SOB at approx 8am. Pt had flu last week. Pt noted to have wheezing in lower lobes. Pt is dialysis and was due yesterday but did not receive. Last treatment monday. Pt received 324 Asprin and 2 nitro. Pt reports some relief with nebulizer

## 2018-02-10 NOTE — Progress Notes (Signed)
HD tx completed @ 2235 w/o problem until the last 17 min of tx when bp dropped and pt was symptomatic, once bp rebounded pt refused to let me turn UF back on, UF off for last 17 min of tx UF goal not met Blood rinsed back VSS Report called to [place], Therapist, sports

## 2018-02-10 NOTE — ED Notes (Signed)
pts daughter updated and spoke with pt as well.

## 2018-02-10 NOTE — ED Notes (Signed)
ED Provider at bedside. 

## 2018-02-10 NOTE — Consult Note (Addendum)
Cardiology Consultation:   Patient ID: Drayk Humbarger; [id number]; 1934/01/28   Admit date: 02/10/2018 Date of Consult: 02/10/2018  Primary Care Provider: Seward Carol, MD Primary Cardiologist: Pixie Casino, MD Primary Electrophysiologist:  Cristopher Peru, MD   Patient Profile:   Sean Best is a 84 y.o. male with a hx of intermittent CHB s/p MDT PPM, ESRD on HD M-W-F, DM, HTN, D-CHF, OA, prostate CA, CVA, lap choley 12/02/2017 w/out cardiac complications, who is being seen today for the evaluation of chest pain at the request of Dr Olevia Bowens.  History of Present Illness:   Mr. Breeden was treated last week for influenza, sx improved but still coughing. Missed HD Weds 2nd family emergency. Cough became productive, one episode of diarrhea. He developed burning chest pain and came to the ER.   Mr. Markwood woke up this morning coughing.  Although he does not otherwise feel ill, the cough has lingered.  He stated he was coughing very hard and could not bring up the phlegm.  He then developed a burning chest pain.  He states it was a 10/10 initially.  It hurt worse with deep inspiration and there is a tender area in his chest just below his left breast.  I could not get comfortable.  He got up to a chair.  This was just after 6 AM.  When his daughter came to check on him, he was still having discomfort and that is when they called 911.  EMS gave him aspirin and sublingual nitroglycerin x2.  The pain started easing off and was completely gone shortly after they got to the emergency room.  It has not returned.  He states he has been having this pain during dialysis for about a month.  After he has been on dialysis for about 2 hours, he will get this burning pain.  It will not be quite as bad as it was today.  That is why he has been getting them to stop dialysis early.  After they stop dialysis, the pain is gone in 5 or 10 minutes.  He likes to be active around the house, does not exercise regularly  but feels he stays busy.  He has not had any other exertional chest pain.   Past Medical History:  Diagnosis Date  . Anemia   . Arthritis    "knees" (04/11/2016)  . Blind left eye    S/P trauma  . Chronic total occlusion of artery of the extremities (HCC)    pt not aware of this  . Claudication ([place])   . ESRD on dialysis [facility])    "MWF; [name]" ((04/10/2016)  . ESRD on peritoneal dialysis [facility])    Started dialysis around April 2015 per son.  Has been doing peritoneal dialysis at home.     . Gout   . Hyperlipidemia   . Hypertension   . Overweight(278.02)   . Presence of permanent cardiac pacemaker    medtronic  . Prostate cancer ([place]) 1990s  . Shingles   . Stroke [facility]) ~1998   denies residual on 04/11/2016  . Type II diabetes mellitus (HCC)    diet controlled  . Umbilical hernia 2/40/9735    Past Surgical History:  Procedure Laterality Date  . AV FISTULA PLACEMENT, RADIOCEPHALIC  32/99/2426   Left arm  . CAPD INSERTION N/A 03/06/2013   Procedure: LAPAROSCOPIC INSERTION CONTINUOUS AMBULATORY PERITONEAL DIALYSIS  (CAPD) CATHETER;  Surgeon: Edward Jolly, MD;  Location: [place];  Service: General;  Laterality: N/A;  .  CATARACT EXTRACTION Right   . CHOLECYSTECTOMY  12/02/2017   LAPROSCOPIC  . EYE SURGERY Left 1970s   for eye injury  . HERNIA REPAIR    . INSERTION PROSTATE RADIATION SEED    . LAPAROSCOPIC CHOLECYSTECTOMY SINGLE SITE WITH INTRAOPERATIVE CHOLANGIOGRAM N/A 12/02/2017   Procedure: LAPAROSCOPIC CHOLECYSTECTOMY;  Surgeon: Michael Boston, MD;  Location: [place];  Service: General;  Laterality: N/A;  . PACEMAKER IMPLANT N/A 04/20/2017   Procedure: PACEMAKER IMPLANT;  Surgeon: Evans Lance, MD;  Location: [place] [facility];  Service: Cardiovascular;  Laterality: N/A;  . UMBILICAL HERNIA REPAIR N/A 03/06/2013   Procedure: HERNIA REPAIR UMBILICAL WITH MESH;  Surgeon: Edward Jolly, MD;  Location: [facility];  Service: General;  Laterality: N/A;     Prior to  Admission medications   Medication Sig Start Date End Date Taking? Authorizing Provider  allopurinol (ZYLOPRIM) 100 MG tablet Take 100 mg by mouth daily.     Yes [provider]  amLODipine (NORVASC) 10 MG tablet Take 10 mg by mouth at bedtime.  01/04/17  Yes [provider]  atorvastatin (LIPITOR) 40 MG tablet Take 40 mg by mouth every evening.  07/07/17  Yes [provider]  calcium acetate (PHOSLO) 667 MG capsule Take 1,334-2,001 mg by mouth See admin instructions. Take 2001 mg by mouth 3 times daily with meals and take 1344 mg by mouth with snacks   Yes [provider]  calcium carbonate (TUMS EX) 750 MG chewable tablet Chew 4 tablets by mouth 2 (two) times daily.   Yes [provider]  Cholecalciferol (VITAMIN D-3) 1000 units CAPS Take 1,000 Units by mouth daily.   Yes [provider]  gabapentin (NEURONTIN) 100 MG capsule Take 100 mg by mouth at bedtime. 12/27/16  Yes [provider]  lidocaine-prilocaine (EMLA) cream Apply 1 application topically as needed (for port access).  10/28/17  Yes [provider]  lisinopril (PRINIVIL,ZESTRIL) 20 MG tablet Take 20 mg by mouth at bedtime. 02/04/18  Yes [provider]  multivitamin (RENA-VIT) TABS tablet Take 1 tablet by mouth daily.   Yes [provider]  naproxen sodium (ALEVE) 220 MG tablet Take 440 mg by mouth at bedtime.   Yes [provider]  pantoprazole (PROTONIX) 40 MG tablet Take 1 tablet (40 mg total) by mouth 2 (two) times daily. 12/08/17 02/10/18 Yes Donne Hazel, MD  sevelamer carbonate (RENVELA) 2.4 g PACK Take 7.2-9.6 g by mouth 3 (three) times daily with meals.  10/13/17  Yes [provider]  VELPHORO 500 MG chewable tablet Chew 500 mg by mouth 3 (three) times daily as needed (when not taking the phoslo).  01/28/18  Yes [provider]  polyethylene glycol (MIRALAX) packet Take 17 g by mouth daily. Patient not taking:  Reported on 02/10/2018 12/08/17   Donne Hazel, MD  traMADol (ULTRAM) 50 MG tablet Take 1 tablet (50 mg total) by mouth every 6 (six) hours as needed for moderate pain or severe pain. Patient not taking: Reported on 02/10/2018 12/02/17   Michael Boston, MD    Inpatient Medications: Scheduled Meds: . budesonide (PULMICORT) nebulizer solution  0.5 mg Nebulization BID  . [START ON 02/11/2018] calcitRIOL  0.5 mcg Oral Once per day on Mon Wed Fri  . [START ON 02/11/2018] Chlorhexidine Gluconate Cloth  6 each Topical Q0600  . [START ON 02/11/2018] cinacalcet  150 mg Oral Q M,W,F  . heparin  5,000 Units Subcutaneous Q8H  . ipratropium  0.5 mg  Nebulization Q6H  . levalbuterol  1.25 mg Nebulization Q6H  . sodium chloride flush  3 mL Intravenous Q12H   Continuous Infusions: . sodium chloride     PRN Meds: sodium chloride, acetaminophen, ondansetron (ZOFRAN) IV, sodium chloride flush  Allergies:    Allergies  Allergen Reactions  . Cardura [Doxazosin Mesylate] Other (See Comments)    Hallucinations    Social History:   Social History   Socioeconomic History  . Marital status: Widowed    Spouse name: Not on file  . Number of children: Not on file  . Years of education: Not on file  . Highest education level: Not on file  Occupational History  . Occupation: Retired  Scientific laboratory technician  . Financial resource strain: Not on file  . Food insecurity:    Worry: Not on file    Inability: Not on file  . Transportation needs:    Medical: Not on file    Non-medical: Not on file  Tobacco Use  . Smoking status: Never Smoker  . Smokeless tobacco: Never Used  Substance and Sexual Activity  . Alcohol use: No  . Drug use: No  . Sexual activity: Not Currently  Lifestyle  . Physical activity:    Days per week: Not on file    Minutes per session: Not on file  . Stress: Not on file  Relationships  . Social connections:    Talks on phone: Not on file    Gets together: Not on file    Attends  religious service: Not on file    Active member of club or organization: Not on file    Attends meetings of clubs or organizations: Not on file    Relationship status: Not on file  . Intimate partner violence:    Fear of current or ex partner: Not on file    Emotionally abused: Not on file    Physically abused: Not on file    Forced sexual activity: Not on file  Other Topics Concern  . Not on file  Social History Narrative   Patient lives with his daughter.    Family History:   Family History  Problem Relation Age of Onset  . Cancer Mother   . Heart disease Mother   . Cancer Father    Family Status:  Family Status  Relation Name Status  . Mother  Deceased  . Father  Deceased  . MGM  Deceased  . MGF  Deceased  . PGM  Deceased  . PGF  Deceased    ROS:  Please see the history of present illness.  All other ROS reviewed and negative.     Physical Exam/Data:   Vitals:   02/10/18 1330 02/10/18 1345 02/10/18 1400 02/10/18 1415  BP: 136/68 (!) 149/58 (!) 159/77 (!) 145/75  Pulse: 71 72 69 71  Resp: 12 20 (!) 21 16  Temp:      TempSrc:      SpO2: 100% 100% 98% 100%  Weight:       No intake or output data in the 24 hours ending 02/10/18 1456 Filed Weights   02/10/18 1112  Weight: 94.7 kg   Body mass index is 34.74 kg/m.  General:  Well nourished, well developed, in no acute distress HEENT: normal Lymph: no adenopathy Neck: JVD 9-10 cm Endocrine:  No thryomegaly Vascular: No carotid bruits; 3/4 extremity pulses 2+, HD fistula L arm w/ thrill  Cardiac:  normal S1, S2; RRR; 2/6 murmur  Lungs:  clear to  auscultation bilaterally, no wheezing, rhonchi, some rales bases Abd: soft, nontender, no hepatomegaly  Ext: no edema Musculoskeletal:  No deformities, BUE and BLE strength normal and equal Skin: warm and dry  Neuro:  CNs 2-12 intact, no focal abnormalities noted Psych:  Normal affect   EKG:  The EKG was personally reviewed and demonstrates:  SR, LBBB is new,  prev had RBBB  Telemetry:  Telemetry was personally reviewed and demonstrates:  SR, ST  Relevant CV Studies:  ECHO: 04/18/2017 - Left ventricle: The cavity size was normal. There was severe   concentric hypertrophy. Systolic function was vigorous. The   estimated ejection fraction was in the range of 65% to 70%. Wall   motion was normal; there were no regional wall motion   abnormalities. The study is not technically sufficient to allow   evaluation of LV diastolic function. - Aortic valve: Calcified with moderate stenosis. There was trivial   regurgitation. Mean gradient (S): 19 mm Hg. Peak gradient (S): 43   mm Hg. Valve area (VTI): 1.25 cm^2. Valve area (Vmax): 1.1 cm^2.   Valve area (Vmean): 1.22 cm^2. - Mitral valve: Thickened, sclerotic leaflets. MAC. Mild to   moderate regurgitation. - Left atrium: Severely dilated. - Right ventricle: The cavity size was mildly dilated. Systolic   function was normal. - Right atrium: Severely dilated. - Tricuspid valve: There was moderate regurgitation. - Pulmonary arteries: PA peak pressure: 72 mm Hg (S). - Inferior vena cava: The vessel was normal in size. The   respirophasic diameter changes were in the normal range (>= 50%),   consistent with normal central venous pressure.  Impressions:  - Compared to a prior study in 04/2016, the LVEF is higher at   65-70%. There is severe LVH and now moderate aortic stenosis with   a mean gradient of 19 mmHg (up from 10 mmHg). Severe biatrial   enlargement is noted with mild to moderate MR, moderate TR and   moderate to severe pulmonary hypertension and RVSP of 72 mmHg   with a normal sized IVC.  Laboratory Data:  Chemistry Recent Labs  Lab 02/10/18 0909 02/10/18 0912  NA 140 138  K 4.7 4.6  CL 98 103  CO2 24  --   GLUCOSE 108* 104*  BUN 57* 52*  CREATININE 11.46* 12.30*  CALCIUM 9.0  --   GFRNONAA 4*  --   GFRAA 4*  --   ANIONGAP 18*  --     Lab Results  Component Value Date    ALT 6 12/07/2017   AST 17 12/07/2017   ALKPHOS 46 12/07/2017   BILITOT 0.6 12/07/2017   Hematology Recent Labs  Lab 02/10/18 0909 02/10/18 0912  WBC 8.3  --   RBC 4.29  --   HGB 10.9* 12.2*  HCT 37.7* 36.0*  MCV 87.9  --   MCH 25.4*  --   MCHC 28.9*  --   RDW 17.9*  --   PLT 293  --    Cardiac EnzymesNo results for input(s): TROPONINI in the last 168 hours.  Recent Labs  Lab 02/10/18 0910  TROPIPOC 0.11*    BNP Recent Labs  Lab 02/10/18 0909  BNP 922.6*    TSH:  Lab Results  Component Value Date   TSH 1.747 04/17/2017   Lipids: Lab Results  Component Value Date   CHOL 96 12/10/2013   HDL 22 (L) 12/10/2013   LDLCALC 36 12/10/2013   TRIG 190 (H) 12/10/2013   CHOLHDL 4.4 12/10/2013  HgbA1c: Lab Results  Component Value Date   HGBA1C 5.6 11/25/2017   Magnesium:  Magnesium  Date Value Ref Range Status  04/18/2017 2.5 (H) 1.7 - 2.4 mg/dL Final    Comment:    Performed at [facility], [place] [address] [place], [place] [address]     Radiology/Studies:  Ct Angio Chest Pe W/cm &/or Wo Cm  Result Date: 02/10/2018 CLINICAL DATA:  Chest pain, shortness of Breath EXAM: CT ANGIOGRAPHY CHEST WITH CONTRAST TECHNIQUE: Multidetector CT imaging of the chest was performed using the standard protocol during bolus administration of intravenous contrast. Multiplanar CT image reconstructions and MIPs were obtained to evaluate the vascular anatomy. CONTRAST:  100 mL Isovue 370 IV ISOVUE-370 IOPAMIDOL (ISOVUE-370) INJECTION 76% COMPARISON:  05/15/2016 FINDINGS: Cardiovascular: Pacer wires noted in the right heart. Diffuse coronary artery calcifications. Moderate aortic calcifications. No aneurysm. Heart is mildly enlarged. No filling defects in the pulmonary arteries to suggest pulmonary emboli. Mediastinum/Nodes: No mediastinal, hilar, or axillary adenopathy. Lungs/Pleura: Small bilateral effusions. Bilateral lower lobe airspace opacities with ground-glass opacities also  noted in the inferior upper lobes. Findings likely reflect early edema and/or atelectasis. Upper Abdomen: Imaging into the upper abdomen shows no acute findings. Musculoskeletal: Bilateral gynecomastia.  No acute bony abnormality. Review of the MIP images confirms the above findings. IMPRESSION: No evidence of pulmonary embolus. Cardiomegaly. Bilateral ground-glass opacities and lower lobe airspace opacities most likely edema. Some of the bibasilar opacities could reflect atelectasis. Small effusions. Diffuse coronary artery disease. Aortic Atherosclerosis (ICD10-I70.0). Electronically Signed   By: Rolm Baptise M.D.   On: 02/10/2018 11:13   Dg Chest Port 1 View  Result Date: 02/10/2018 CLINICAL DATA:  One-week history of cough and chest congestion and acute shortness of breath that began earlier today. Current history of diabetes, hypertension and end-stage renal disease on hemodialysis. EXAM: PORTABLE CHEST 1 VIEW COMPARISON:  12/06/2017 and earlier. FINDINGS: Cardiac silhouette moderately enlarged, unchanged. RIGHT subclavian dual lead transvenous pacemaker unchanged. Interval development of mild diffuse interstitial pulmonary edema. No confluent airspace consolidation. Possible small BILATERAL pleural effusions. IMPRESSION: Mild CHF, with stable moderate cardiomegaly and mild diffuse interstitial pulmonary edema. Possible small BILATERAL effusions. Electronically Signed   By: Evangeline Dakin M.D.   On: 02/10/2018 09:31    Assessment and Plan:   1.  Unstable anginal pain: -Troponin is mildly elevated, but has been elevated in the past. -Trend troponins and see if this is a true non-STEMI, or just chronic troponin elevation. - He was previously having symptoms only after 2 hours of dialysis, probably the most strenuous thing that he does. -However, today, he had chest pain that lasted much longer than usual and started after coughing episode. -It resolved after 2 sublingual nitroglycerin and  aspirin. -His ECG has a left bundle, previously had right bundle.  However, although pacing spikes are not seen, if they are present, they can account for the change. -He is high risk to have significant coronary artery disease. - Add heparin, add oral nitrates. -Date his echocardiogram, his aortic stenosis may have gotten worse -Cardiac catheterization tomorrow, he is on the board for Dr. Burt Knack at approximately 33. -Dr. Angelena Form to discuss the risks and benefits of the cardiac catheterization.  2.  Hypertension: -Per the patient, he is compliant with medications but his blood pressure at home always runs high.  He says his diastolic blood pressures generally over 637 and systolic is approximately 160. -Monitor blood pressures here in the hospital, add Imdur.  -Otherwise, per  IM Principal Problem:   Chest pain Active Problems:   Anemia   DM (diabetes mellitus), type 2 with renal complications, diet controlled   HTN (hypertension)   Hyperlipidemia   H/O: stroke   ESRD on hemodialysis (HCC)   Elevated troponin   Gout  For questions or updates, please contact [facility] Please consult [email or web address] for contact info under Cardiology/STEMI.   Signed, Rosaria Ferries, PA-C  02/10/2018 2:56 PM  I have personally seen and examined this patient with Rosaria Ferries, PA-C. I agree with the assessment and plan as outlined above. Mr. Hoeger is a pleasant 84 yo male with h/o moderate aortic stenosis, severe LVH. CHB s/p PPM, DM, HTN. HLD, prior CVA, ESRD on HD who is admitted with chest pain and dyspnea. He has been having chest pain while on HD for several weeks. Over the past few days he has had dyspnea along with exertional chest pain. EKG upon arrival to ED with heart rate 104 bpm with apparent new LBBB. No obvious pacer spikes and at this rate unlikely to be a paced rhythm. Telemetry reviewed by me now shows sinus with a narrow QRS. EKG from this am with sinus tachy, LBBB, PVC. Labs  reviewed by me. Troponin 0.11.  He has no chest pain at this time. No dyspnea at rest.  He was seen in the hospital in February 2019 by Dr. Debara Pickett with [facility]. Dr. Lovena Le has followed his pacemaker since then.   My exam: General: Well developed, well nourished, NAD  HEENT: OP clear, mucus membranes moist  SKIN: warm, dry. No rashes. Neuro: No focal deficits  Musculoskeletal: Muscle strength 5/5 all ext  Psychiatric: Mood and affect normal  Neck: No JVD, no carotid bruits, no thyromegaly, no lymphadenopathy.  Lungs:Clear bilaterally, no wheezes, rhonci, crackles Cardiovascular: Regular rate and rhythm. Loud, harsh systolic murmur.  Abdomen:Soft. Bowel sounds present. Non-tender.  Extremities: No lower extremity edema. Pulses are 2 + in the bilateral DP/PT.  Plan:   1. NSTEMI: He has chest pain and dyspnea concerning for unstable angina. Troponin mildly elevated in pt on HD. EKG with probable new LBBB this am. Now narrow complex QRS on tele. Will repeat EKG now. Risk factors for CAD including age, DM, HTN, HLD and ESRD on HD. I think a cardiac cath is indicated. Will continue IV heparin tonight. Cycle cardiac markers. Plan cardiac cath tomorrow with possible PCI.  I have reviewed the risks, indications, and alternatives to cardiac catheterization, possible angioplasty, and stenting with the patient. Risks include but are not limited to bleeding, infection, vascular injury, stroke, myocardial infection, arrhythmia, kidney injury, radiation-related injury in the case of prolonged fluoroscopy use, emergency cardiac surgery, and death. The patient understands the risks of serious complication is 1-2 in 6237 with diagnostic cardiac cath and 1-2% or less with angioplasty/stenting.  2. Moderate AS by echo in February 2019. Will repeat echo now.   Lauree Chandler 02/10/2018 3:32 PM

## 2018-02-10 NOTE — ED Provider Notes (Addendum)
[facility] EMERGENCY DEPARTMENT Provider Note   CSN: [id number] Arrival date & time: 02/10/18  7253    History   Chief Complaint Chief Complaint  Patient presents with  . Shortness of Breath    HPI Sean Best is a 84 y.o. male who presents with acute onset of CP/SOB and cough. PMH significant for ESRD on dialysis (M/W/F), HTN, HLD, complete heart block s/p pacemaker, Type 2 DM, prostate cancer. History is provided by the patient and EMS. He states his symptoms started all of a sudden about 1 hour ago. He was in bed and getting ready to get up when he started coughing and couldn't breathe. He had the flu last week and has a residual cough from this. He started to have chest pain as well. It's over the left side of his chest and constant and non-radiating. His daughter called EMS. They gave 2 nitro and ASA which did not provide much relief. They gave a breathing tx for SOB and wheezing and this seemed to help his breathing. He missed dialysis yesterday because of a family emergency. He went Monday and had a full session. No fever, chills, abdominal pain, extremity swelling. Echo in Feb 2019 showed severe hypertrophy with EF 65-70%, moderate AS, severely dilated left and right atrium.  HPI  Past Medical History:  Diagnosis Date  . Anemia   . Arthritis    "knees" (04/11/2016)  . Blind left eye    S/P trauma  . Chronic total occlusion of artery of the extremities (HCC)    pt not aware of this  . Claudication ([place])   . ESRD on dialysis [facility])    "MWF; [name]" ((04/10/2016)  . ESRD on peritoneal dialysis [facility])    Started dialysis around April 2015 per son.  Has been doing peritoneal dialysis at home.     . Gout   . Hyperlipidemia   . Hypertension   . Overweight(278.02)   . Presence of permanent cardiac pacemaker    medtronic  . Prostate cancer ([place]) 1990s  . Shingles   . Stroke [facility]) ~1998   denies residual on 04/11/2016  . Type II diabetes mellitus (HCC)     diet controlled  . Umbilical hernia 6/64/4034    Patient Active Problem List   Diagnosis Date Noted  . Constipation 12/08/2017  . Abdominal pain 12/06/2017  . Leukocytosis 12/06/2017  . Gout 12/06/2017  . Chronic pain 12/06/2017  . Acute on chronic cholecystitis s/p lap cholecystectomy 12/02/2017 12/02/2017  . Cardiac pacemaker in situ (Medtronic DDD) 12/02/2017  . Acute hypoxemic respiratory failure ([place]) 04/18/2017  . Heart block 04/17/2017  . Elevated troponin 04/17/2017  . ESRD on hemodialysis ([place]) 04/12/2016  . H/O: stroke 04/11/2016  . Saccular aneurysm of infrarenal aorta with thrombosed dissection of abdominal aorta 04/11/2016  . Obesity (BMI 30-39.9) 04/11/2016  . Syncope 04/11/2016  . Prostate CA ([place]) 12/28/2014  . Chest pain 12/28/2014  . Hyperkalemia 03/11/2013  . Anemia 03/11/2013  . DM (diabetes mellitus), type 2 with renal complications, diet controlled 03/11/2013  . HTN (hypertension) 03/11/2013  . Hyperlipidemia 03/11/2013  . ESRD on dialysis [facility]) 03/20/2011    Past Surgical History:  Procedure Laterality Date  . AV FISTULA PLACEMENT, RADIOCEPHALIC  74/25/9563   Left arm  . CAPD INSERTION N/A 03/06/2013   Procedure: LAPAROSCOPIC INSERTION CONTINUOUS AMBULATORY PERITONEAL DIALYSIS  (CAPD) CATHETER;  Surgeon: Edward Jolly, MD;  Location: [facility];  Service: General;  Laterality: N/A;  . CATARACT EXTRACTION  Right   . CHOLECYSTECTOMY  12/02/2017   LAPROSCOPIC  . EYE SURGERY Left 1970s   for eye injury  . HERNIA REPAIR    . INSERTION PROSTATE RADIATION SEED    . LAPAROSCOPIC CHOLECYSTECTOMY SINGLE SITE WITH INTRAOPERATIVE CHOLANGIOGRAM N/A 12/02/2017   Procedure: LAPAROSCOPIC CHOLECYSTECTOMY;  Surgeon: Michael Boston, MD;  Location: [facility];  Service: General;  Laterality: N/A;  . PACEMAKER IMPLANT N/A 04/20/2017   Procedure: PACEMAKER IMPLANT;  Surgeon: Evans Lance, MD;  Location: [facility];  Service: Cardiovascular;  Laterality: N/A;  .  UMBILICAL HERNIA REPAIR N/A 03/06/2013   Procedure: HERNIA REPAIR UMBILICAL WITH MESH;  Surgeon: Edward Jolly, MD;  Location: [facility] OR;  Service: General;  Laterality: N/A;        Home Medications    Prior to Admission medications   Medication Sig Start Date End Date Taking? Authorizing Provider  allopurinol (ZYLOPRIM) 100 MG tablet Take 100 mg by mouth daily.      [provider]  amLODipine (NORVASC) 10 MG tablet Take 10 mg by mouth at bedtime.  01/04/17   [provider]  atorvastatin (LIPITOR) 40 MG tablet Take 40 mg by mouth daily. 07/07/17   [provider]  calcium acetate (PHOSLO) 667 MG capsule Take 1,334-2,001 mg by mouth See admin instructions. Take 2001 mg by mouth 3 times daily with meals and take 1344 mg by mouth with snacks    [provider]  calcium carbonate (TUMS EX) 750 MG chewable tablet Chew 4 tablets by mouth 2 (two) times daily.    [provider]  Cholecalciferol (VITAMIN D-3) 1000 units CAPS Take 1,000 Units by mouth daily.    [provider]  gabapentin (NEURONTIN) 100 MG capsule Take 100 mg by mouth at bedtime. 12/27/16   [provider]  lidocaine-prilocaine (EMLA) cream Apply 1 application topically as needed (for port access).  10/28/17   [provider]  lisinopril (PRINIVIL,ZESTRIL) 10 MG tablet Take 10 mg by mouth at bedtime. 03/25/17   [provider]  multivitamin (RENA-VIT) TABS tablet Take 1 tablet by mouth daily.    [provider]  pantoprazole (PROTONIX) 40 MG tablet Take 1 tablet (40 mg total) by mouth 2 (two) times daily. 12/08/17 01/07/18  Donne Hazel, MD  polyethylene glycol [facility]) packet Take 17 g by mouth daily. 12/08/17   Donne Hazel, MD  sevelamer carbonate (RENVELA) 2.4 g PACK Take 7.2 g by mouth 3 (three) times daily with meals.  10/13/17   [provider]  traMADol (ULTRAM) 50 MG tablet Take 1 tablet (50 mg total) by mouth every 6 (six)  hours as needed for moderate pain or severe pain. 12/02/17   Michael Boston, MD    Family History Family History  Problem Relation Age of Onset  . Cancer Mother   . Heart disease Mother   . Cancer Father     Social History Social History   Tobacco Use  . Smoking status: Never Smoker  . Smokeless tobacco: Never Used  Substance Use Topics  . Alcohol use: No  . Drug use: No     Allergies   Cardura [doxazosin mesylate]   Review of Systems Review of Systems  Constitutional: Negative for fever.  Respiratory: Positive for cough, shortness of breath and wheezing.   Cardiovascular: Positive for chest pain.  Gastrointestinal: Negative for abdominal pain, nausea and vomiting.  Neurological: Negative for syncope.  All other systems reviewed and are negative.  Physical Exam Updated Vital Signs BP (!) 162/99 (BP Location: Right Arm)   Pulse (!) 104   Resp (!) 25   SpO2 99%   Physical Exam Vitals signs and nursing note reviewed.  Constitutional:      General: He is not in acute distress.    Appearance: He is well-developed. He is obese. He is ill-appearing.  HENT:     Head: Normocephalic and atraumatic.  Eyes:     General: No scleral icterus.       Right eye: No discharge.        Left eye: No discharge.     Conjunctiva/sclera: Conjunctivae normal.     Pupils: Pupils are equal, round, and reactive to light.  Neck:     Musculoskeletal: Normal range of motion.  Cardiovascular:     Rate and Rhythm: Tachycardia present.  Pulmonary:     Effort: Pulmonary effort is normal. Tachypnea present. No respiratory distress.     Breath sounds: Wheezing and rhonchi present.  Chest:     Comments: Fistula in left upper extremity with palpable thrill Abdominal:     General: There is no distension.     Palpations: Abdomen is soft.  Musculoskeletal:     Comments: Trace peripheral edema  Skin:    General: Skin is warm and dry.  Neurological:     Mental Status: He is alert and  oriented to person, place, and time.  Psychiatric:        Behavior: Behavior normal.      ED Treatments / Results  Labs (all labs ordered are listed, but only abnormal results are displayed) Labs Reviewed  BASIC METABOLIC PANEL - Abnormal; Notable for the following components:      Result Value   Glucose, Bld 108 (*)    BUN 57 (*)    Creatinine, Ser 11.46 (*)    GFR calc non Af Amer 4 (*)    GFR calc Af Amer 4 (*)    Anion gap 18 (*)    All other components within normal limits  CBC WITH DIFFERENTIAL/PLATELET - Abnormal; Notable for the following components:   Hemoglobin 10.9 (*)    HCT 37.7 (*)    MCH 25.4 (*)    MCHC 28.9 (*)    RDW 17.9 (*)    All other components within normal limits  BRAIN NATRIURETIC PEPTIDE - Abnormal; Notable for the following components:   B Natriuretic Peptide 922.6 (*)    All other components within normal limits  I-STAT CHEM 8, ED - Abnormal; Notable for the following components:   BUN 52 (*)    Creatinine, Ser 12.30 (*)    Glucose, Bld 104 (*)    Calcium, Ion 1.03 (*)    Hemoglobin 12.2 (*)    HCT 36.0 (*)    All other components within normal limits  I-STAT TROPONIN, ED - Abnormal; Notable for the following components:   Troponin i, poc 0.11 (*)    All other components within normal limits    EKG EKG Interpretation  Date/Time:  Thursday February 10 2018 09:01:23 EST Ventricular Rate:  104 PR Interval:    QRS Duration: 149 QT Interval:  400 QTC Calculation: 527 R Axis:   -103 Text Interpretation:  Sinus tachycardia Ventricular premature complex Left bundle branch block - new when compared to 12/07/17 Confirmed by Quintella Reichert [phone]) on 02/10/2018 9:06:40 AM   Radiology Ct Angio Chest Pe W/cm &/or Wo Cm  Result Date: 02/10/2018 CLINICAL DATA:  Chest  pain, shortness of Breath EXAM: CT ANGIOGRAPHY CHEST WITH CONTRAST TECHNIQUE: Multidetector CT imaging of the chest was performed using the standard protocol during bolus  administration of intravenous contrast. Multiplanar CT image reconstructions and MIPs were obtained to evaluate the vascular anatomy. CONTRAST:  100 mL Isovue 370 IV ISOVUE-370 IOPAMIDOL (ISOVUE-370) INJECTION 76% COMPARISON:  05/15/2016 FINDINGS: Cardiovascular: Pacer wires noted in the right heart. Diffuse coronary artery calcifications. Moderate aortic calcifications. No aneurysm. Heart is mildly enlarged. No filling defects in the pulmonary arteries to suggest pulmonary emboli. Mediastinum/Nodes: No mediastinal, hilar, or axillary adenopathy. Lungs/Pleura: Small bilateral effusions. Bilateral lower lobe airspace opacities with ground-glass opacities also noted in the inferior upper lobes. Findings likely reflect early edema and/or atelectasis. Upper Abdomen: Imaging into the upper abdomen shows no acute findings. Musculoskeletal: Bilateral gynecomastia.  No acute bony abnormality. Review of the MIP images confirms the above findings. IMPRESSION: No evidence of pulmonary embolus. Cardiomegaly. Bilateral ground-glass opacities and lower lobe airspace opacities most likely edema. Some of the bibasilar opacities could reflect atelectasis. Small effusions. Diffuse coronary artery disease. Aortic Atherosclerosis (ICD10-I70.0). Electronically Signed   By: Rolm Baptise M.D.   On: 02/10/2018 11:13   Dg Chest Port 1 View  Result Date: 02/10/2018 CLINICAL DATA:  One-week history of cough and chest congestion and acute shortness of breath that began earlier today. Current history of diabetes, hypertension and end-stage renal disease on hemodialysis. EXAM: PORTABLE CHEST 1 VIEW COMPARISON:  12/06/2017 and earlier. FINDINGS: Cardiac silhouette moderately enlarged, unchanged. RIGHT subclavian dual lead transvenous pacemaker unchanged. Interval development of mild diffuse interstitial pulmonary edema. No confluent airspace consolidation. Possible small BILATERAL pleural effusions. IMPRESSION: Mild CHF, with stable moderate  cardiomegaly and mild diffuse interstitial pulmonary edema. Possible small BILATERAL effusions. Electronically Signed   By: Evangeline Dakin M.D.   On: 02/10/2018 09:31    Procedures Procedures (including critical care time)  CRITICAL CARE Performed by: Recardo Evangelist   Total critical care time: 35 minutes  Critical care time was exclusive of separately billable procedures and treating other patients.  Critical care was necessary to treat or prevent imminent or life-threatening deterioration.  Critical care was time spent personally by me on the following activities: development of treatment plan with patient and/or surrogate as well as nursing, discussions with consultants, evaluation of patient's response to treatment, examination of patient, obtaining history from patient or surrogate, ordering and performing treatments and interventions, ordering and review of laboratory studies, ordering and review of radiographic studies, pulse oximetry and re-evaluation of patient's condition.   Medications Ordered in ED Medications  iopamidol (ISOVUE-370) 76 % injection (has no administration in time range)     Initial Impression / Assessment and Plan / ED Course  I have reviewed the triage vital signs and the nursing notes.  Pertinent labs & imaging results that were available during my care of the patient were reviewed by me and considered in my medical decision making (see chart for details).  84 year old male presents with acute onset of CP and SOB after coughing this morning. He is hypertensive but otherwise vitals are normal. He was placed on 2L O2 for comfort. On exam he has diffuse wheezing and rhonchi. He has no chest tenderness. He appears mildly volume overloaded. Will obtain EKG, CXR, labs and reassess.  EKG is remarkable for sinus tachycardia, PVCs, and LBBB. CXR shows mild diffuse interstitial pulmonary edema with small bilateral pleural effusions. I-stat trop is .11. CBC is  remarkable for anemia which is  around his baseline. BMP is remarkable for labs consistent with ESRD. BNP is elevated to 922.   10:32 AM Rechecked pt. He feels better. Still has some residual chest pain. His breathing is better. He was taken off O2 and sats remain ~95%. CTA of chest is pending.  11:11 AM Spoke with Dr. Jonnie Finner who will see pt in consult.  11:28 AM CTA is negative for PE. It shows diffuse coronary atherosclerosis with mild pulmonary edema and pleural effusions. Pt's chest pain is resolved.  11:47 AM Cardiology to see  12:09 PM Dr. Olevia Bowens to admit    Final Clinical Impressions(s) / ED Diagnoses   Final diagnoses:  Elevated troponin  Chest pain, unspecified type  Acute pulmonary edema ([place])  Cough  ESRD (end stage renal disease) [facility])    ED Discharge Orders    None       Recardo Evangelist, PA-C 02/10/18 1209    Recardo Evangelist, PA-C 02/10/18 1209    Quintella Reichert, MD 02/11/18 1036

## 2018-02-10 NOTE — ED Notes (Signed)
Pt placed on 2L O2 [place]

## 2018-02-10 NOTE — ED Notes (Signed)
Report given to brie on [facility]

## 2018-02-10 NOTE — Progress Notes (Signed)
RT paged MD about albuterol order. MD has not called back. RT changed albuterol to xopenex 1.25mg  due to the patients cardiac history.

## 2018-02-10 NOTE — H&P (Signed)
History and Physical    Sean Best XVQ:[id number] DOB: April 24, 1933 DOA: 02/10/2018  PCP: Seward Carol, MD   Patient coming from: Home.  I have personally briefly reviewed patient's old medical records in [place]  Chief Complaint: Chest pain.  HPI: Sean Best is a 84 y.o. male with medical history significant of anemia, osteoarthritis of the knees, left eye blindness, PAD, ESRD on hemodialysis, hyperlipidemia, hypertension, pacemaker placement, history of prostate CA, history of herpes zoster, history of CVA, type 2 diabetes, umbilical hernia who is coming to the emergency department with complaints of burning, precordial chest pain, non-radiated, since early this morning associated with sudden onset of dyspnea and cough.  He was given aspirin and 2 sublingual nitroglycerin by his daughter, which did not provide relief of symptoms.  He he was treated last week for influenza.  He states that he has been having some cough since then, although he feels a lot better from this.  He had received a breathing treatment, which provided some relief.  He missed dialysis yesterday due to a family emergency.  He denies fever or chills in the last few days, sore throat or rhinorrhea.  He continues to cough, which now is more productive than her earlier.  He denies wheezing or hemoptysis.  He denies palpitations, PND, orthopnea or worsening of pitting edema of the lower extremities.  He denies abdominal pain, nausea, emesis, constipation, melena or hematochezia.  He had an episode of loose stools this morning.  He urinates infrequently, but denies dysuria, frequency or hematuria.  Denies skin rashes or pruritus.  ED Course: Initial vital signs temperature 98 F, pulse 104, respirations 25, blood pressure 162/99 mmHg and O2 sat 99% on aerosol mask.  Lab work shows a white count of 8.3 with a normal differential, hemoglobin 10.9 g/dL and platelets 293.  I-STAT troponin was 0.11 ng/mL and BNP 922.6  pg/mL.  His EKG shows sinus tachycardia at 104 bpm with VPC and a new LBBB.    Imaging: chest radiograph shows mild CHF.  CTA chest did not show PE, but showed cardiomegaly with edema and small bilateral pleural effusions.  Possible bibasilar atelectasis.  There was diffuse coronary artery disease and aortic atherosclerosis.  The CT images from [place] [facility] report for further detail.  Review of Systems: As per HPI otherwise 10 point review of systems negative.   Past Medical History:  Diagnosis Date  . Anemia   . Arthritis    "knees" (04/11/2016)  . Blind left eye    S/P trauma  . Chronic total occlusion of artery of the extremities (HCC)    pt not aware of this  . Claudication ([place])   . ESRD on dialysis [facility])    "MWF; [name]" ((04/10/2016)  . ESRD on peritoneal dialysis [facility])    Started dialysis around April 2015 per son.  Has been doing peritoneal dialysis at home.     . Gout   . Hyperlipidemia   . Hypertension   . Overweight(278.02)   . Presence of permanent cardiac pacemaker    medtronic  . Prostate cancer ([place]) 1990s  . Shingles   . Stroke [facility]) ~1998   denies residual on 04/11/2016  . Type II diabetes mellitus (HCC)    diet controlled  . Umbilical hernia 0/93/2671    Past Surgical History:  Procedure Laterality Date  . AV FISTULA PLACEMENT, RADIOCEPHALIC  24/58/0998   Left arm  . CAPD INSERTION N/A 03/06/2013   Procedure: LAPAROSCOPIC INSERTION CONTINUOUS AMBULATORY  PERITONEAL DIALYSIS  (CAPD) CATHETER;  Surgeon: Edward Jolly, MD;  Location: [place];  Service: General;  Laterality: N/A;  . CATARACT EXTRACTION Right   . CHOLECYSTECTOMY  12/02/2017   LAPROSCOPIC  . EYE SURGERY Left 1970s   for eye injury  . HERNIA REPAIR    . INSERTION PROSTATE RADIATION SEED    . LAPAROSCOPIC CHOLECYSTECTOMY SINGLE SITE WITH INTRAOPERATIVE CHOLANGIOGRAM N/A 12/02/2017   Procedure: LAPAROSCOPIC CHOLECYSTECTOMY;  Surgeon: Michael Boston, MD;  Location: [place];  Service:  General;  Laterality: N/A;  . PACEMAKER IMPLANT N/A 04/20/2017   Procedure: PACEMAKER IMPLANT;  Surgeon: Evans Lance, MD;  Location: [place] [facility];  Service: Cardiovascular;  Laterality: N/A;  . UMBILICAL HERNIA REPAIR N/A 03/06/2013   Procedure: HERNIA REPAIR UMBILICAL WITH MESH;  Surgeon: Edward Jolly, MD;  Location: [place];  Service: General;  Laterality: N/A;     reports that he has never smoked. He has never used smokeless tobacco. He reports that he does not drink alcohol or use drugs.  Allergies  Allergen Reactions  . Cardura [Doxazosin Mesylate] Other (See Comments)    Hallucinations    Family History  Problem Relation Age of Onset  . Cancer Mother   . Heart disease Mother   . Cancer Father    Prior to Admission medications   Medication Sig Start Date End Date Taking? Authorizing Provider  allopurinol (ZYLOPRIM) 100 MG tablet Take 100 mg by mouth daily.     Yes [provider]  amLODipine (NORVASC) 10 MG tablet Take 10 mg by mouth at bedtime.  01/04/17  Yes [provider]  atorvastatin (LIPITOR) 40 MG tablet Take 40 mg by mouth every evening.  07/07/17  Yes [provider]  calcium acetate (PHOSLO) 667 MG capsule Take 1,334-2,001 mg by mouth See admin instructions. Take 2001 mg by mouth 3 times daily with meals and take 1344 mg by mouth with snacks   Yes [provider]  calcium carbonate (TUMS EX) 750 MG chewable tablet Chew 4 tablets by mouth 2 (two) times daily.   Yes [provider]  Cholecalciferol (VITAMIN D-3) 1000 units CAPS Take 1,000 Units by mouth daily.   Yes [provider]  gabapentin (NEURONTIN) 100 MG capsule Take 100 mg by mouth at bedtime. 12/27/16  Yes [provider]  lidocaine-prilocaine (EMLA) cream Apply 1 application topically as needed (for port access).  10/28/17  Yes [provider]  lisinopril (PRINIVIL,ZESTRIL) 20 MG tablet Take 20 mg by mouth at bedtime. 02/04/18  Yes  [provider]  multivitamin (RENA-VIT) TABS tablet Take 1 tablet by mouth daily.   Yes [provider]  naproxen sodium (ALEVE) 220 MG tablet Take 440 mg by mouth at bedtime.   Yes [provider]  pantoprazole (PROTONIX) 40 MG tablet Take 1 tablet (40 mg total) by mouth 2 (two) times daily. 12/08/17 02/10/18 Yes Donne Hazel, MD  sevelamer carbonate (RENVELA) 2.4 g PACK Take 7.2-9.6 g by mouth 3 (three) times daily with meals.  10/13/17  Yes [provider]  VELPHORO 500 MG chewable tablet Chew 500 mg by mouth 3 (three) times daily as needed (when not taking the phoslo).  01/28/18  Yes [provider]  polyethylene glycol (MIRALAX) packet Take 17 g by mouth daily. Patient not taking: Reported on 02/10/2018 12/08/17   Donne Hazel, MD  traMADol (ULTRAM) 50 MG tablet Take 1 tablet (50 mg total) by mouth every 6 (six) hours as  needed for moderate pain or severe pain. Patient not taking: Reported on 02/10/2018 12/02/17   Michael Boston, MD    Physical Exam: Vitals:   02/10/18 1045 02/10/18 1048 02/10/18 1112 02/10/18 1230  BP: (!) 163/103   (!) 172/91  Pulse: 95 (!) 111  91  Resp: 15 15  18   Temp:      TempSrc:      SpO2:  100%  99%  Weight:   94.7 kg     Constitutional: NAD, calm, comfortable Eyes: PERRL, lids and conjunctivae normal ENMT: Mucous membranes are moist. Posterior pharynx clear of any exudate or lesions. Neck: normal, supple, no masses, no thyromegaly Respiratory: Mild decrease in air movement bilaterally, bibasilar Rales, no wheezing. Normal respiratory effort. No accessory muscle use.  Cardiovascular: Regular rate and rhythm, positive 3/6 SEM, no rubs / gallops. No extremity edema. 2+ pedal pulses. No carotid bruits.  Abdomen: Obese, soft, no tenderness, no masses palpated. No hepatosplenomegaly. Bowel sounds positive.  Musculoskeletal: no clubbing / cyanosis.  Good ROM, no contractures. Normal muscle tone.  Skin: Multiple  hyperpigmented lesions on facial area, upper torso and torso Neurologic: CN 2-12 grossly intact. Sensation intact, DTR normal. Strength 5/5 in all 4.  Psychiatric: Normal judgment and insight. Alert and oriented x 3. Normal mood.   Labs on Admission: I have personally reviewed following labs and imaging studies  CBC: Recent Labs  Lab 02/10/18 0909 02/10/18 0912  WBC 8.3  --   NEUTROABS 5.8  --   HGB 10.9* 12.2*  HCT 37.7* 36.0*  MCV 87.9  --   PLT 293  --    Basic Metabolic Panel: Recent Labs  Lab 02/10/18 0909 02/10/18 0912  NA 140 138  K 4.7 4.6  CL 98 103  CO2 24  --   GLUCOSE 108* 104*  BUN 57* 52*  CREATININE 11.46* 12.30*  CALCIUM 9.0  --    GFR: Estimated Creatinine Clearance: 4.9 mL/min (A) (by C-G formula based on SCr of 12.3 mg/dL (H)). Liver Function Tests: No results for input(s): AST, ALT, ALKPHOS, BILITOT, PROT, ALBUMIN in the last 168 hours. No results for input(s): LIPASE, AMYLASE in the last 168 hours. No results for input(s): AMMONIA in the last 168 hours. Coagulation Profile: No results for input(s): INR, PROTIME in the last 168 hours. Cardiac Enzymes: No results for input(s): CKTOTAL, CKMB, CKMBINDEX, TROPONINI in the last 168 hours. BNP (last 3 results) No results for input(s): PROBNP in the last 8760 hours. HbA1C: No results for input(s): HGBA1C in the last 72 hours. CBG: No results for input(s): GLUCAP in the last 168 hours. Lipid Profile: No results for input(s): CHOL, HDL, LDLCALC, TRIG, CHOLHDL, LDLDIRECT in the last 72 hours. Thyroid Function Tests: No results for input(s): TSH, T4TOTAL, FREET4, T3FREE, THYROIDAB in the last 72 hours. Anemia Panel: No results for input(s): VITAMINB12, FOLATE, FERRITIN, TIBC, IRON, RETICCTPCT in the last 72 hours. Urine analysis:    Component Value Date/Time   COLORURINE YELLOW 12/29/2014 0550   APPEARANCEUR CLOUDY (A) 12/29/2014 0550   LABSPEC 1.020 12/29/2014 0550   PHURINE 5.0 12/29/2014 0550    GLUCOSEU NEGATIVE 12/29/2014 0550   HGBUR SMALL (A) 12/29/2014 0550   BILIRUBINUR NEGATIVE 12/29/2014 0550   KETONESUR NEGATIVE 12/29/2014 0550   PROTEINUR 100 (A) 12/29/2014 0550   UROBILINOGEN 0.2 12/29/2014 0550   NITRITE NEGATIVE 12/29/2014 0550   LEUKOCYTESUR SMALL (A) 12/29/2014 0550    Radiological Exams on Admission: Ct Angio Chest Pe W/cm &/or Wo Cm  Result Date: 02/10/2018 CLINICAL DATA:  Chest pain, shortness of Breath EXAM: CT ANGIOGRAPHY CHEST WITH CONTRAST TECHNIQUE: Multidetector CT imaging of the chest was performed using the standard protocol during bolus administration of intravenous contrast. Multiplanar CT image reconstructions and MIPs were obtained to evaluate the vascular anatomy. CONTRAST:  100 mL Isovue 370 IV ISOVUE-370 IOPAMIDOL (ISOVUE-370) INJECTION 76% COMPARISON:  05/15/2016 FINDINGS: Cardiovascular: Pacer wires noted in the right heart. Diffuse coronary artery calcifications. Moderate aortic calcifications. No aneurysm. Heart is mildly enlarged. No filling defects in the pulmonary arteries to suggest pulmonary emboli. Mediastinum/Nodes: No mediastinal, hilar, or axillary adenopathy. Lungs/Pleura: Small bilateral effusions. Bilateral lower lobe airspace opacities with ground-glass opacities also noted in the inferior upper lobes. Findings likely reflect early edema and/or atelectasis. Upper Abdomen: Imaging into the upper abdomen shows no acute findings. Musculoskeletal: Bilateral gynecomastia.  No acute bony abnormality. Review of the MIP images confirms the above findings. IMPRESSION: No evidence of pulmonary embolus. Cardiomegaly. Bilateral ground-glass opacities and lower lobe airspace opacities most likely edema. Some of the bibasilar opacities could reflect atelectasis. Small effusions. Diffuse coronary artery disease. Aortic Atherosclerosis (ICD10-I70.0). Electronically Signed   By: Rolm Baptise M.D.   On: 02/10/2018 11:13   Dg Chest Port 1 View  Result Date:  02/10/2018 CLINICAL DATA:  One-week history of cough and chest congestion and acute shortness of breath that began earlier today. Current history of diabetes, hypertension and end-stage renal disease on hemodialysis. EXAM: PORTABLE CHEST 1 VIEW COMPARISON:  12/06/2017 and earlier. FINDINGS: Cardiac silhouette moderately enlarged, unchanged. RIGHT subclavian dual lead transvenous pacemaker unchanged. Interval development of mild diffuse interstitial pulmonary edema. No confluent airspace consolidation. Possible small BILATERAL pleural effusions. IMPRESSION: Mild CHF, with stable moderate cardiomegaly and mild diffuse interstitial pulmonary edema. Possible small BILATERAL effusions. Electronically Signed   By: Evangeline Dakin M.D.   On: 02/10/2018 09:31   04/18/2017 echocardiogram complete  ------------------------------------------------------------------- LV EF: 65% -   70%  ------------------------------------------------------------------- History:   PMH:  Elevated Troponin.  Syncope.  Stroke.  Risk factors:  Hypertension. Diabetes mellitus. Dyslipidemia.  ------------------------------------------------------------------- Study Conclusions  - Left ventricle: The cavity size was normal. There was severe   concentric hypertrophy. Systolic function was vigorous. The   estimated ejection fraction was in the range of 65% to 70%. Wall   motion was normal; there were no regional wall motion   abnormalities. The study is not technically sufficient to allow   evaluation of LV diastolic function. - Aortic valve: Calcified with moderate stenosis. There was trivial   regurgitation. Mean gradient (S): 19 mm Hg. Peak gradient (S): 43   mm Hg. Valve area (VTI): 1.25 cm^2. Valve area (Vmax): 1.1 cm^2.   Valve area (Vmean): 1.22 cm^2. - Mitral valve: Thickened, sclerotic leaflets. MAC. Mild to   moderate regurgitation. - Left atrium: Severely dilated. - Right ventricle: The cavity size was mildly  dilated. Systolic   function was normal. - Right atrium: Severely dilated. - Tricuspid valve: There was moderate regurgitation. - Pulmonary arteries: PA peak pressure: 72 mm Hg (S). - Inferior vena cava: The vessel was normal in size. The   respirophasic diameter changes were in the normal range (>= 50%),   consistent with normal central venous pressure.  Impressions:  - Compared to a prior study in 04/2016, the LVEF is higher at   65-70%. There is severe LVH and now moderate aortic stenosis with   a mean gradient of 19 mmHg (up from 10 mmHg). Severe biatrial   enlargement  is noted with mild to moderate MR, moderate TR and   moderate to severe pulmonary hypertension and RVSP of 72 mmHg   with a normal sized IVC.  EKG: Independently reviewed.  Vent. rate 104 BPM PR interval * ms QRS duration 149 ms QT/QTc 400/527 ms P-R-T axes 0 257 70 Sinus tachycardia Ventricular premature complex Left bundle branch block, which is new when compared to previous tracing.  Assessment/Plan Principal Problem:   Chest pain Observation/telemetry. Continue supplemental oxygen. Received aspirin earlier. Trend troponin levels. Follow-up EKG. Cardiology was consulted by the ED.  Please follow recommendations.  Active Problems:   ESRD on hemodialysis [facility]) Seen by nephrology and scheduled for HD.    Anemia Monitor hematocrit and hemoglobin. Epogen per nephrology.    DM (diabetes mellitus), type 2 with renal complications, diet controlled Carbohydrate modified diet. CBG monitoring before meals and bedtime.    HTN (hypertension) Continue amlodipine 10 mg p.o. daily. Continue lisinopril 20 mg p.o. at bedtime.    Hyperlipidemia Continue atorvastatin 40 mg p.o. daily. Monitor LFTs.    H/O: stroke Supportive care.    Elevated troponin Having chest pain, but also has history of ESRD. Trend troponin level.    Gout Continue allopurinol 100 mg p.o. daily    DVT prophylaxis: Heparin  SQ. Code Status: Full code. Family Communication: Disposition Plan: Observation for HD and chest pain/troponin level trending/cardiology consult. Consults called: Nephrology/Dr. Jonnie Finner and cardiology (still pending). Admission status: Observation/telemetry.   Reubin Milan MD [facility] Pager [phone].  If 7PM-7AM, please contact night-coverage [email or web address] Password [facility]  02/10/2018, 12:45 PM

## 2018-02-11 ENCOUNTER — Inpatient Hospital Stay (HOSPITAL_COMMUNITY)
Admission: EM | Disposition: A | Discharge status: Home / Self Care | Payer: Self-pay | Source: Home / Self Care | Attending: Internal Medicine

## 2018-02-11 DIAGNOSIS — Z79899 Other long term (current) drug therapy: Secondary | ICD-10-CM | POA: Diagnosis not present

## 2018-02-11 DIAGNOSIS — R05 Cough: Secondary | ICD-10-CM | POA: Diagnosis present

## 2018-02-11 DIAGNOSIS — I132 Hypertensive heart and chronic kidney disease with heart failure and with stage 5 chronic kidney disease, or end stage renal disease: Secondary | ICD-10-CM | POA: Diagnosis present

## 2018-02-11 DIAGNOSIS — I200 Unstable angina: Secondary | ICD-10-CM | POA: Diagnosis not present

## 2018-02-11 DIAGNOSIS — I25110 Atherosclerotic heart disease of native coronary artery with unstable angina pectoris: Secondary | ICD-10-CM

## 2018-02-11 DIAGNOSIS — I214 Non-ST elevation (NSTEMI) myocardial infarction: Secondary | ICD-10-CM | POA: Diagnosis present

## 2018-02-11 DIAGNOSIS — E1122 Type 2 diabetes mellitus with diabetic chronic kidney disease: Secondary | ICD-10-CM | POA: Diagnosis present

## 2018-02-11 DIAGNOSIS — E785 Hyperlipidemia, unspecified: Secondary | ICD-10-CM

## 2018-02-11 DIAGNOSIS — D631 Anemia in chronic kidney disease: Secondary | ICD-10-CM

## 2018-02-11 DIAGNOSIS — I10 Essential (primary) hypertension: Secondary | ICD-10-CM

## 2018-02-11 DIAGNOSIS — Z8673 Personal history of transient ischemic attack (TIA), and cerebral infarction without residual deficits: Secondary | ICD-10-CM

## 2018-02-11 DIAGNOSIS — I447 Left bundle-branch block, unspecified: Secondary | ICD-10-CM | POA: Diagnosis present

## 2018-02-11 DIAGNOSIS — Z8249 Family history of ischemic heart disease and other diseases of the circulatory system: Secondary | ICD-10-CM | POA: Diagnosis not present

## 2018-02-11 DIAGNOSIS — Z9841 Cataract extraction status, right eye: Secondary | ICD-10-CM | POA: Diagnosis not present

## 2018-02-11 DIAGNOSIS — Z791 Long term (current) use of non-steroidal anti-inflammatories (NSAID): Secondary | ICD-10-CM | POA: Diagnosis not present

## 2018-02-11 DIAGNOSIS — I2584 Coronary atherosclerosis due to calcified coronary lesion: Secondary | ICD-10-CM | POA: Diagnosis present

## 2018-02-11 DIAGNOSIS — J810 Acute pulmonary edema: Secondary | ICD-10-CM | POA: Diagnosis not present

## 2018-02-11 DIAGNOSIS — Z9049 Acquired absence of other specified parts of digestive tract: Secondary | ICD-10-CM | POA: Diagnosis not present

## 2018-02-11 DIAGNOSIS — R079 Chest pain, unspecified: Secondary | ICD-10-CM | POA: Diagnosis not present

## 2018-02-11 DIAGNOSIS — R7989 Other specified abnormal findings of blood chemistry: Secondary | ICD-10-CM | POA: Diagnosis not present

## 2018-02-11 DIAGNOSIS — I272 Pulmonary hypertension, unspecified: Secondary | ICD-10-CM | POA: Diagnosis present

## 2018-02-11 DIAGNOSIS — Z8546 Personal history of malignant neoplasm of prostate: Secondary | ICD-10-CM | POA: Diagnosis not present

## 2018-02-11 DIAGNOSIS — M170 Bilateral primary osteoarthritis of knee: Secondary | ICD-10-CM | POA: Diagnosis present

## 2018-02-11 DIAGNOSIS — Z6834 Body mass index (BMI) 34.0-34.9, adult: Secondary | ICD-10-CM | POA: Diagnosis not present

## 2018-02-11 DIAGNOSIS — Z888 Allergy status to other drugs, medicaments and biological substances status: Secondary | ICD-10-CM | POA: Diagnosis not present

## 2018-02-11 DIAGNOSIS — Z950 Presence of cardiac pacemaker: Secondary | ICD-10-CM | POA: Diagnosis not present

## 2018-02-11 DIAGNOSIS — R072 Precordial pain: Secondary | ICD-10-CM

## 2018-02-11 DIAGNOSIS — N186 End stage renal disease: Secondary | ICD-10-CM | POA: Diagnosis present

## 2018-02-11 DIAGNOSIS — H5462 Unqualified visual loss, left eye, normal vision right eye: Secondary | ICD-10-CM | POA: Diagnosis present

## 2018-02-11 DIAGNOSIS — N2581 Secondary hyperparathyroidism of renal origin: Secondary | ICD-10-CM | POA: Diagnosis present

## 2018-02-11 DIAGNOSIS — Z992 Dependence on renal dialysis: Secondary | ICD-10-CM | POA: Diagnosis not present

## 2018-02-11 DIAGNOSIS — E669 Obesity, unspecified: Secondary | ICD-10-CM | POA: Diagnosis present

## 2018-02-11 HISTORY — PX: CORONARY STENT INTERVENTION: CATH118234

## 2018-02-11 HISTORY — PX: LEFT HEART CATH AND CORONARY ANGIOGRAPHY: CATH118249

## 2018-02-11 LAB — COMPREHENSIVE METABOLIC PANEL
ALT: 15 U/L (ref 0–44)
AST: 18 U/L (ref 15–41)
Albumin: 3.1 g/dL — ABNORMAL LOW (ref 3.5–5.0)
Alkaline Phosphatase: 40 U/L (ref 38–126)
Anion gap: 15 (ref 5–15)
BUN: 37 mg/dL — ABNORMAL HIGH (ref 8–23)
CHLORIDE: 99 mmol/L (ref 98–111)
CO2: 26 mmol/L (ref 22–32)
Calcium: 8.8 mg/dL — ABNORMAL LOW (ref 8.9–10.3)
Creatinine, Ser: 8.55 mg/dL — ABNORMAL HIGH (ref 0.61–1.24)
GFR calc Af Amer: 6 mL/min — ABNORMAL LOW (ref >60)
GFR calc non Af Amer: 5 mL/min — ABNORMAL LOW (ref >60)
Glucose, Bld: 105 mg/dL — ABNORMAL HIGH (ref 70–99)
Potassium: 4.2 mmol/L (ref 3.5–5.1)
Sodium: 140 mmol/L (ref 135–145)
Total Bilirubin: 0.6 mg/dL (ref 0.3–1.2)
Total Protein: 6.1 g/dL — ABNORMAL LOW (ref 6.5–8.1)

## 2018-02-11 LAB — CBC
HEMATOCRIT: 31.6 % — AB (ref 39.0–52.0)
Hemoglobin: 9.3 g/dL — ABNORMAL LOW (ref 13.0–17.0)
MCH: 25.3 pg — ABNORMAL LOW (ref 26.0–34.0)
MCHC: 29.4 g/dL — ABNORMAL LOW (ref 30.0–36.0)
MCV: 85.9 fL (ref 80.0–100.0)
NRBC: 0 % (ref 0.0–0.2)
Platelets: 252 10*3/uL (ref 150–400)
RBC: 3.68 MIL/uL — ABNORMAL LOW (ref 4.22–5.81)
RDW: 18.2 % — ABNORMAL HIGH (ref 11.5–15.5)
WBC: 7.4 10*3/uL (ref 4.0–10.5)

## 2018-02-11 LAB — TROPONIN I
TROPONIN I: 0.1 ng/mL — AB (ref <0.03)
Troponin I: 0.13 ng/mL (ref <0.03)

## 2018-02-11 LAB — GLUCOSE, CAPILLARY
Glucose-Capillary: 96 mg/dL (ref 70–99)
Glucose-Capillary: 77 mg/dL (ref 70–99)

## 2018-02-11 SURGERY — LEFT HEART CATH AND CORONARY ANGIOGRAPHY
Anesthesia: LOCAL

## 2018-02-11 MED ORDER — SODIUM CHLORIDE 0.9 % IV SOLN: INTRAVENOUS | Status: DC

## 2018-02-11 MED ORDER — HEPARIN SODIUM (PORCINE) 1000 UNIT/ML IJ SOLN
INTRAMUSCULAR | Status: AC
  Filled 2018-02-11: qty 1

## 2018-02-11 MED ORDER — MIDAZOLAM HCL 2 MG/2ML IJ SOLN
INTRAMUSCULAR | Status: DC | PRN
  Administered 2018-02-11 (×2): 1 mg via INTRAVENOUS

## 2018-02-11 MED ORDER — LABETALOL HCL 5 MG/ML IV SOLN
INTRAVENOUS | Status: AC
  Filled 2018-02-11: qty 4

## 2018-02-11 MED ORDER — ASPIRIN 81 MG PO CHEW
81.0000 mg | CHEWABLE_TABLET | Freq: Every day | ORAL | Status: DC
  Administered 2018-02-12 – 2018-02-13 (×2): 81 mg via ORAL
  Filled 2018-02-11 (×2): qty 1

## 2018-02-11 MED ORDER — ANGIOPLASTY BOOK
Freq: Once | Status: AC
  Administered 2018-02-11: 1
  Filled 2018-02-11: qty 1

## 2018-02-11 MED ORDER — TICAGRELOR 90 MG PO TABS
ORAL_TABLET | ORAL | Status: DC | PRN
  Administered 2018-02-11: 180 mg via ORAL

## 2018-02-11 MED ORDER — HEPARIN (PORCINE) IN NACL 1000-0.9 UT/500ML-% IV SOLN
INTRAVENOUS | Status: DC | PRN
  Administered 2018-02-11 (×2): 500 mL

## 2018-02-11 MED ORDER — TICAGRELOR 90 MG PO TABS
ORAL_TABLET | ORAL | Status: AC
  Filled 2018-02-11: qty 2

## 2018-02-11 MED ORDER — MIDAZOLAM HCL 2 MG/2ML IJ SOLN
INTRAMUSCULAR | Status: AC
  Filled 2018-02-11: qty 2

## 2018-02-11 MED ORDER — SODIUM CHLORIDE 0.9 % IV SOLN
INTRAVENOUS | Status: DC
  Administered 2018-02-11: 09:00:00 via INTRAVENOUS

## 2018-02-11 MED ORDER — SODIUM CHLORIDE 0.9% FLUSH
3.0000 mL | Freq: Two times a day (BID) | INTRAVENOUS | Status: DC
  Administered 2018-02-11 – 2018-02-12 (×3): 3 mL via INTRAVENOUS

## 2018-02-11 MED ORDER — SODIUM CHLORIDE 0.9 % IV SOLN: 250.0000 mL | INTRAVENOUS | Status: DC | PRN

## 2018-02-11 MED ORDER — LIDOCAINE HCL (PF) 1 % IJ SOLN
INTRAMUSCULAR | Status: AC
  Filled 2018-02-11: qty 30

## 2018-02-11 MED ORDER — LEVALBUTEROL HCL 1.25 MG/0.5ML IN NEBU
1.2500 mg | INHALATION_SOLUTION | Freq: Two times a day (BID) | RESPIRATORY_TRACT | Status: DC
  Administered 2018-02-11 – 2018-02-13 (×3): 1.25 mg via RESPIRATORY_TRACT
  Filled 2018-02-11 (×5): qty 0.5

## 2018-02-11 MED ORDER — SODIUM CHLORIDE 0.9% FLUSH
3.0000 mL | Freq: Two times a day (BID) | INTRAVENOUS | Status: DC
  Administered 2018-02-11 (×2): 3 mL via INTRAVENOUS

## 2018-02-11 MED ORDER — HEPARIN SODIUM (PORCINE) 1000 UNIT/ML IJ SOLN
INTRAMUSCULAR | Status: DC | PRN
  Administered 2018-02-11: 7000 [IU] via INTRAVENOUS
  Administered 2018-02-11: 2000 [IU] via INTRAVENOUS
  Administered 2018-02-11: 3000 [IU] via INTRAVENOUS
  Administered 2018-02-11: 5000 [IU] via INTRAVENOUS

## 2018-02-11 MED ORDER — NITROGLYCERIN 1 MG/10 ML FOR IR/CATH LAB
INTRA_ARTERIAL | Status: AC
  Filled 2018-02-11: qty 10

## 2018-02-11 MED ORDER — LIDOCAINE HCL (PF) 1 % IJ SOLN
INTRAMUSCULAR | Status: DC | PRN
  Administered 2018-02-11: 15 mL via INTRADERMAL

## 2018-02-11 MED ORDER — HEPARIN (PORCINE) IN NACL 1000-0.9 UT/500ML-% IV SOLN
INTRAVENOUS | Status: AC
  Filled 2018-02-11: qty 1000

## 2018-02-11 MED ORDER — SODIUM CHLORIDE 0.9% FLUSH: 3.0000 mL | INTRAVENOUS | Status: DC | PRN

## 2018-02-11 MED ORDER — LABETALOL HCL 5 MG/ML IV SOLN
10.0000 mg | INTRAVENOUS | Status: AC | PRN
  Administered 2018-02-11: 10 mg via INTRAVENOUS

## 2018-02-11 MED ORDER — ASPIRIN 81 MG PO CHEW
81.0000 mg | CHEWABLE_TABLET | ORAL | Status: AC
  Administered 2018-02-11: 81 mg via ORAL
  Filled 2018-02-11: qty 1

## 2018-02-11 MED ORDER — LABETALOL HCL 5 MG/ML IV SOLN
INTRAVENOUS | Status: DC | PRN
  Administered 2018-02-11: 10 mg via INTRAVENOUS

## 2018-02-11 MED ORDER — IPRATROPIUM BROMIDE 0.02 % IN SOLN
0.5000 mg | Freq: Two times a day (BID) | RESPIRATORY_TRACT | Status: DC
  Administered 2018-02-11 (×2): 0.5 mg via RESPIRATORY_TRACT
  Filled 2018-02-11 (×2): qty 2.5

## 2018-02-11 MED ORDER — HYDRALAZINE HCL 20 MG/ML IJ SOLN
INTRAMUSCULAR | Status: AC
  Filled 2018-02-11: qty 1

## 2018-02-11 MED ORDER — IOHEXOL 350 MG/ML SOLN
INTRAVENOUS | Status: DC | PRN
  Administered 2018-02-11: 175 mL via INTRA_ARTERIAL

## 2018-02-11 MED ORDER — TICAGRELOR 90 MG PO TABS
90.0000 mg | ORAL_TABLET | Freq: Two times a day (BID) | ORAL | Status: DC
  Administered 2018-02-11 – 2018-02-13 (×4): 90 mg via ORAL
  Filled 2018-02-11 (×4): qty 1

## 2018-02-11 MED ORDER — ASPIRIN 81 MG PO CHEW: 81.0000 mg | CHEWABLE_TABLET | ORAL | Status: DC

## 2018-02-11 SURGICAL SUPPLY — 29 items
BALLN SAPPHIRE 2.5X12 (BALLOONS) ×2
BALLN SAPPHIRE ~~LOC~~ 3.5X8 (BALLOONS) ×2 IMPLANT
BALLOON SAPPHIRE 2.5X12 (BALLOONS) ×1 IMPLANT
CATH INFINITI 5FR JL5 (CATHETERS) ×2 IMPLANT
CATH INFINITI 5FR MULTPACK ANG (CATHETERS) ×2 IMPLANT
CATH LAUNCHER 6FR EBU 4 (CATHETERS) ×2 IMPLANT
DEVICE CLOSURE PERCLS PRGLD 6F (VASCULAR PRODUCTS) ×1 IMPLANT
FEM STOP ARCH (HEMOSTASIS) ×1
KIT ENCORE 26 ADVANTAGE (KITS) ×2 IMPLANT
KIT HEART LEFT (KITS) ×2 IMPLANT
KIT HEMO VALVE WATCHDOG (MISCELLANEOUS) ×2 IMPLANT
PACK CARDIAC CATHETERIZATION (CUSTOM PROCEDURE TRAY) ×2 IMPLANT
PERCLOSE PROGLIDE 6F (VASCULAR PRODUCTS) ×2
PINNACLE LONG 5F 25CM (SHEATH) ×2
SHEATH BRITE TIP 6FR 35CM (SHEATH) ×2 IMPLANT
SHEATH INTROD PINNACLE 5F 25CM (SHEATH) ×1 IMPLANT
SHEATH PINNACLE 5F 10CM (SHEATH) ×2 IMPLANT
SHEATH PINNACLE 6F 10CM (SHEATH) ×2 IMPLANT
SHEATH PROBE COVER 6X72 (BAG) ×2 IMPLANT
STENT ORSIRO 3.0X13 (Permanent Stent) ×2 IMPLANT
STENT ORSIRO 3.5X13 (Permanent Stent) ×2 IMPLANT
SYSTEM COMPRESSION FEMOSTOP (HEMOSTASIS) ×1 IMPLANT
TRANSDUCER W/STOPCOCK (MISCELLANEOUS) ×2 IMPLANT
TUBING CIL FLEX 10 FLL-RA (TUBING) ×2 IMPLANT
WIRE AMPLATZ ST .035X145CM (WIRE) ×2 IMPLANT
WIRE ASAHI GRAND SLAM 180CM (WIRE) ×2 IMPLANT
WIRE COUGAR XT STRL 190CM (WIRE) ×2 IMPLANT
WIRE EMERALD 3MM-J .035X150CM (WIRE) ×2 IMPLANT
WIRE HI TORQ VERSACORE-J 145CM (WIRE) ×4 IMPLANT

## 2018-02-11 NOTE — H&P (View-Only) (Signed)
Progress Note  Patient Name: Sean Best Date of Encounter: 02/11/2018  Primary Cardiologist:  Pixie Casino, MD  Subjective   Had a little burning CP towards the end of HD, resolved when he was taken off. Feels well today.   Inpatient Medications    Scheduled Meds: . allopurinol  100 mg Oral Daily  . amLODipine  10 mg Oral QHS  . aspirin  81 mg Oral Pre-Cath  . atorvastatin  40 mg Oral QPM  . budesonide (PULMICORT) nebulizer solution  0.5 mg Nebulization BID  . calcitRIOL  0.5 mcg Oral Once per day on Mon Wed Fri  . calcium acetate  2,001 mg Oral TID WC  . calcium carbonate  4 tablet Oral BID  . Chlorhexidine Gluconate Cloth  6 each Topical Q0600  . cholecalciferol  1,000 Units Oral Daily  . cinacalcet  150 mg Oral Q M,W,F  . gabapentin  100 mg Oral QHS  . heparin  5,000 Units Subcutaneous Q8H  . ipratropium  0.5 mg Nebulization BID  . isosorbide mononitrate  30 mg Oral Daily  . levalbuterol  1.25 mg Nebulization BID  . lisinopril  20 mg Oral QHS  . pantoprazole  40 mg Oral BID  . sevelamer carbonate  7.2 g Oral TID WC  . sodium chloride flush  3 mL Intravenous Q12H  . sodium chloride flush  3 mL Intravenous Q12H   Continuous Infusions: . sodium chloride    . sodium chloride    . sodium chloride    . sodium chloride    . sodium chloride     PRN Meds: sodium chloride, sodium chloride, sodium chloride, sodium chloride, acetaminophen, alteplase, calcium acetate, heparin, heparin, hydrALAZINE, lidocaine (PF), lidocaine-prilocaine, ondansetron (ZOFRAN) IV, pentafluoroprop-tetrafluoroeth, sodium chloride flush, sodium chloride flush, sucroferric oxyhydroxide   Vital Signs    Vitals:   02/10/18 2259 02/10/18 2336 02/11/18 0119 02/11/18 0516  BP: 116/72 (!) 142/80  (!) 165/93  Pulse: 92   96  Resp: (!) 33 20  18  Temp: 98.7 F (37.1 C) 97.6 F (36.4 C)  98.7 F (37.1 C)  TempSrc: Oral Oral  Oral  SpO2: 99% 96% 99% 99%  Weight: 86.9 kg     Height:         Intake/Output Summary (Last 24 hours) at 02/11/2018 0900 Last data filed at 02/10/2018 2235 Gross per 24 hour  Intake -  Output 2505 ml  Net -2505 ml   Filed Weights   02/10/18 1801 02/10/18 1925 02/10/18 2259  Weight: 89.4 kg 89.4 kg 86.9 kg    Telemetry    SR, PACs, PVCs and pairs - Personally Reviewed  ECG    12/13, SR, HR  - Personally Reviewed  Physical Exam   General: Well developed, well nourished, male appearing in no acute distress. Head: Normocephalic, atraumatic.  Neck: Supple without bruits, JVD not elevated. Lungs:  Resp regular and unlabored, CTA. Heart: RRR, S1, S2, no S3, S4, 2/6 murmur; no rub. Abdomen: Soft, non-tender, non-distended with normoactive bowel sounds. No hepatomegaly. No rebound/guarding. No obvious abdominal masses. Extremities: No clubbing, cyanosis, no edema. Distal pedal pulses are 2+ bilaterally. Neuro: Alert and oriented X 3. Moves all extremities spontaneously. Psych: Normal affect.  Labs    Hematology Recent Labs  Lab 02/10/18 0909 02/10/18 0912 02/11/18 0522  WBC 8.3  --  7.4  RBC 4.29  --  3.68*  HGB 10.9* 12.2* 9.3*  HCT 37.7* 36.0* 31.6*  MCV 87.9  --  85.9  MCH 25.4*  --  25.3*  MCHC 28.9*  --  29.4*  RDW 17.9*  --  18.2*  PLT 293  --  252    Chemistry Recent Labs  Lab 02/10/18 0909 02/10/18 0912 02/11/18 0522  NA 140 138 140  K 4.7 4.6 4.2  CL 98 103 99  CO2 24  --  26  GLUCOSE 108* 104* 105*  BUN 57* 52* 37*  CREATININE 11.46* 12.30* 8.55*  CALCIUM 9.0  --  8.8*  PROT  --   --  6.1*  ALBUMIN  --   --  3.1*  AST  --   --  18  ALT  --   --  15  ALKPHOS  --   --  40  BILITOT  --   --  0.6  GFRNONAA 4*  --  5*  GFRAA 4*  --  6*  ANIONGAP 18*  --  15     Cardiac Enzymes Recent Labs  Lab 02/10/18 1512 02/10/18 2338 02/11/18 0522  TROPONINI 0.11* 0.10* 0.13*    Recent Labs  Lab 02/10/18 0910  TROPIPOC 0.11*     BNP Recent Labs  Lab 02/10/18 0909  BNP 922.6*    Lab Results   Component Value Date   CHOL 96 12/10/2013   HDL 22 (L) 12/10/2013   LDLCALC 36 12/10/2013   TRIG 190 (H) 12/10/2013   CHOLHDL 4.4 12/10/2013     Radiology    Ct Angio Chest Pe W/cm &/or Wo Cm  Result Date: 02/10/2018 CLINICAL DATA:  Chest pain, shortness of Breath EXAM: CT ANGIOGRAPHY CHEST WITH CONTRAST TECHNIQUE: Multidetector CT imaging of the chest was performed using the standard protocol during bolus administration of intravenous contrast. Multiplanar CT image reconstructions and MIPs were obtained to evaluate the vascular anatomy. CONTRAST:  100 mL Isovue 370 IV ISOVUE-370 IOPAMIDOL (ISOVUE-370) INJECTION 76% COMPARISON:  05/15/2016 FINDINGS: Cardiovascular: Pacer wires noted in the right heart. Diffuse coronary artery calcifications. Moderate aortic calcifications. No aneurysm. Heart is mildly enlarged. No filling defects in the pulmonary arteries to suggest pulmonary emboli. Mediastinum/Nodes: No mediastinal, hilar, or axillary adenopathy. Lungs/Pleura: Small bilateral effusions. Bilateral lower lobe airspace opacities with ground-glass opacities also noted in the inferior upper lobes. Findings likely reflect early edema and/or atelectasis. Upper Abdomen: Imaging into the upper abdomen shows no acute findings. Musculoskeletal: Bilateral gynecomastia.  No acute bony abnormality. Review of the MIP images confirms the above findings. IMPRESSION: No evidence of pulmonary embolus. Cardiomegaly. Bilateral ground-glass opacities and lower lobe airspace opacities most likely edema. Some of the bibasilar opacities could reflect atelectasis. Small effusions. Diffuse coronary artery disease. Aortic Atherosclerosis (ICD10-I70.0). Electronically Signed   By: Rolm Baptise M.D.   On: 02/10/2018 11:13   Dg Chest Port 1 View  Result Date: 02/10/2018 CLINICAL DATA:  One-week history of cough and chest congestion and acute shortness of breath that began earlier today. Current history of diabetes,  hypertension and end-stage renal disease on hemodialysis. EXAM: PORTABLE CHEST 1 VIEW COMPARISON:  12/06/2017 and earlier. FINDINGS: Cardiac silhouette moderately enlarged, unchanged. RIGHT subclavian dual lead transvenous pacemaker unchanged. Interval development of mild diffuse interstitial pulmonary edema. No confluent airspace consolidation. Possible small BILATERAL pleural effusions. IMPRESSION: Mild CHF, with stable moderate cardiomegaly and mild diffuse interstitial pulmonary edema. Possible small BILATERAL effusions. Electronically Signed   By: Evangeline Dakin M.D.   On: 02/10/2018 09:31     Cardiac Studies   ECHO:  02/10/2018 - Left ventricle: The cavity size was normal.  Wall thickness was   increased in a pattern of moderate LVH. Systolic function was   normal. The estimated ejection fraction was in the range of 60%   to 65%. Wall motion was normal; there were no regional wall   motion abnormalities. Doppler parameters are consistent with   pseudonormal left ventricular relaxation (grade 2 diastolic   dysfunction). The E/e&' ratio is >15, suggesting elevated LV   filling pressure. - Aortic valve: Calcified with restricted leaflet motion. Mild   stenosis. There was trivial regurgitation. Mean gradient (S): 13   mm Hg. Peak gradient (S): 24 mm Hg. Valve area (VTI): 1.53 cm^2.   Valve area (Vmax): 1.42 cm^2. Valve area (Vmean): 1.54 cm^2. - Mitral valve: Calcified annulus. Mildly thickened leaflets .   There was mild regurgitation. - Left atrium: Severely dilated. - Systemic veins: The IVC measures <2.1 cm, but does not collapse   >50%, suggesting an elevated RA pressure of 8 mmHg. - Pericardium, extracardiac: A trivial pericardial effusion was   identified posterior to the heart.  Impressions:  - Compared to a prior study in 04/2017, the LVEF is slightly lower,   but normal at 60-65%. THere is mild to moderate aortic stenosis   with a lower mean gradient of 13  mmHg.  Patient Profile     84 y.o. male w/ hx intermittent CHB s/p MDT PPM, ESRD on HD M-W-F, DM, HTN, D-CHF, OA, prostate CA, CVA, lap choley 12/02/2017 w/out cardiac complications, was admitted 12/12 w/ chest pain.   Assessment & Plan    1. NSTEMI:  - ez have remained mildly elevated - had CP at HD last pm, c/w pre-admit sx - on subcu heparin, Imdur, ASA, high-dose statin - HR is never low and believe BP will tolerate, add metoprolol 12.5 mg bid - cath scheduled for 10:30 am.  Otherwise, per IM Principal Problem:   Chest pain Active Problems:   Anemia   DM (diabetes mellitus), type 2 with renal complications, diet controlled   HTN (hypertension)   Hyperlipidemia   H/O: stroke   ESRD on hemodialysis ([place])   Elevated troponin   Gout    Signed, Rosaria Ferries , PA-C 9:00 AM 02/11/2018 Pager: [phone]  I have personally seen and examined this patient. I agree with the assessment and plan as outlined above.  No chest pain this am. He did have chest pain on HD last night. Troponin mildly elevated with flat trend. Cardiac cath this am with possible PCI.   Lauree Chandler 02/11/2018 10:24 AM

## 2018-02-11 NOTE — Progress Notes (Signed)
Site area: Right groin the fem stop was removed and manual pressure held.  Site Prior to Removal:  Level 0  Pressure Applied For 25 MINUTES    Bedrest Beginning at 1630p  Manual:   Yes.    Patient Status During Pull:  stable  Post Pull Groin Site:  Level 0  Post Pull Instructions Given:  Yes.    Post Pull Pulses Present:  Yes.    Dressing Applied:  Yes.  Pressure dressing applied.  Comments:  VS remain stable

## 2018-02-11 NOTE — Interval H&P Note (Signed)
History and Physical Interval Note:  02/11/2018 10:40 AM  Sean Best  has presented today for surgery, with the diagnosis of unstable anginal pain  The various methods of treatment have been discussed with the patient and family. After consideration of risks, benefits and other options for treatment, the patient has consented to  Procedure(s): LEFT HEART CATH AND CORONARY ANGIOGRAPHY (N/A) as a surgical intervention .  The patient's history has been reviewed, patient examined, no change in status, stable for surgery.  I have reviewed the patient's chart and labs.  Questions were answered to the patient's satisfaction.     Sherren Mocha

## 2018-02-11 NOTE — Progress Notes (Signed)
Pt returned to unit in stable condition. Vitals WDL. Alert and oriented x4. Skin warm and dry. No distress noted.

## 2018-02-11 NOTE — Progress Notes (Addendum)
Subjective: No sob . For card cath  This am , HD  Later today ( op sch. MWF)  Objective Vital signs in last 24 hours: Vitals:   02/10/18 2336 02/11/18 0119 02/11/18 0516 02/11/18 1057  BP: (!) 142/80  (!) 165/93   Pulse:   96   Resp: 20  18   Temp: 97.6 F (36.4 C)  98.7 F (37.1 C)   TempSrc: Oral  Oral   SpO2: 96% 99% 99% 98%  Weight:      Height:       Weight change:   Physical Exam: General: Well developed,NAD , OX3 Appropriate  Neck . JVD not elevated. Lungs: CTA  bilat  Heart: RRR ,2/6 sem ,no rub or gallop. Abdomen: Soft, non-tender, non-distended, BS +.   Extremities: No pedal edema  Dialysis Access: L forearm AVF + thrill  Dialysis Orders:  MWF at [place], 450/A2.0, EDW 89.5kg, 2K/2Ca, AVF, heparin 6000 bolus - Mircera 172mcg IV q 2 weeks (?last unclear) - Sensipar 150mg  PO q HD - Calcitriol 0.29mcg PO q HD  Problem/Plan:: 1.  Dyspnea/Pulm edema: In setting of missed HD and recent URI.(?body wt. Loss ,lower edw ) Also CARD EVAL with CATH TODAY- yest 2505 uf  Post wt 86.9  Below edw , hd today  Or am pending cath  2.  ESRD: Usual MWF sched. Short HD yest  For vol and today, back to usual MWF sched. 3.  Hypertension/volume: BP ^ with volume excess, will improve with HD. 4.  Anemia: Hgb 12.2 on admit  Today ?9.3  Recheck pre hd / not due for ESA yet. 5.  Metabolic bone disease: Ca ok, Phos pending hd today . Continue home binders, will resume sensipar/VDRA with HD. 6.  Type 2 DM 7.  CAD: Trop 0.11 - up from baseline. ?Demand ischemia. Per primary. 8.   Hx pacemaker 9.   Hx CVA   Ernest Haber, PA-C [facility] Beeper [phone] 02/11/2018,11:19 AM  LOS: 0 days   Pt seen, examined and agree w A/P as above.  Kelly Splinter MD [facility] pager [phone]   02/11/2018, 1:22 PM    Labs: Basic Metabolic Panel: Recent Labs  Lab 02/10/18 0909 02/10/18 0912 02/11/18 0522  NA 140 138 140  K 4.7 4.6 4.2  CL 98 103  99  CO2 24  --  26  GLUCOSE 108* 104* 105*  BUN 57* 52* 37*  CREATININE 11.46* 12.30* 8.55*  CALCIUM 9.0  --  8.8*   Liver Function Tests: Recent Labs  Lab 02/11/18 0522  AST 18  ALT 15  ALKPHOS 40  BILITOT 0.6  PROT 6.1*  ALBUMIN 3.1*    CBC: Recent Labs  Lab 02/10/18 0909 02/10/18 0912 02/11/18 0522  WBC 8.3  --  7.4  NEUTROABS 5.8  --   --   HGB 10.9* 12.2* 9.3*  HCT 37.7* 36.0* 31.6*  MCV 87.9  --  85.9  PLT 293  --  252   Cardiac Enzymes: Recent Labs  Lab 02/10/18 1512 02/10/18 2338 02/11/18 0522  TROPONINI 0.11* 0.10* 0.13*   CBG: Recent Labs  Lab 02/10/18 1730 02/10/18 2334 02/11/18 0643  GLUCAP 84 100* 96     Medications: . [MAR Hold] sodium chloride    . [MAR Hold] sodium chloride    . [MAR Hold] sodium chloride    . sodium chloride    . sodium chloride 50 mL/hr at 02/11/18 0914   . [MAR Hold] allopurinol  100 mg Oral Daily  . [MAR Hold] amLODipine  10 mg Oral QHS  . [MAR Hold] atorvastatin  40 mg Oral QPM  . [MAR Hold] budesonide (PULMICORT) nebulizer solution  0.5 mg Nebulization BID  . [MAR Hold] calcitRIOL  0.5 mcg Oral Once per day on Mon Wed Fri  . [MAR Hold] calcium acetate  2,001 mg Oral TID WC  . [MAR Hold] calcium carbonate  4 tablet Oral BID  . [MAR Hold] Chlorhexidine Gluconate Cloth  6 each Topical Q0600  . [MAR Hold] cholecalciferol  1,000 Units Oral Daily  . [MAR Hold] cinacalcet  150 mg Oral Q M,W,F  . [MAR Hold] gabapentin  100 mg Oral QHS  . [MAR Hold] heparin  5,000 Units Subcutaneous Q8H  . [MAR Hold] ipratropium  0.5 mg Nebulization BID  . [MAR Hold] isosorbide mononitrate  30 mg Oral Daily  . [MAR Hold] levalbuterol  1.25 mg Nebulization BID  . [MAR Hold] lisinopril  20 mg Oral QHS  . [MAR Hold] pantoprazole  40 mg Oral BID  . [MAR Hold] sevelamer carbonate  7.2 g Oral TID WC  . [MAR Hold] sodium chloride flush  3 mL Intravenous Q12H  . sodium chloride flush  3 mL Intravenous Q12H

## 2018-02-11 NOTE — Progress Notes (Addendum)
Progress Note  Patient Name: Sean Best Date of Encounter: 02/11/2018  Primary Cardiologist:  Pixie Casino, MD  Subjective   Had a little burning CP towards the end of HD, resolved when he was taken off. Feels well today.   Inpatient Medications    Scheduled Meds: . allopurinol  100 mg Oral Daily  . amLODipine  10 mg Oral QHS  . aspirin  81 mg Oral Pre-Cath  . atorvastatin  40 mg Oral QPM  . budesonide (PULMICORT) nebulizer solution  0.5 mg Nebulization BID  . calcitRIOL  0.5 mcg Oral Once per day on Mon Wed Fri  . calcium acetate  2,001 mg Oral TID WC  . calcium carbonate  4 tablet Oral BID  . Chlorhexidine Gluconate Cloth  6 each Topical Q0600  . cholecalciferol  1,000 Units Oral Daily  . cinacalcet  150 mg Oral Q M,W,F  . gabapentin  100 mg Oral QHS  . heparin  5,000 Units Subcutaneous Q8H  . ipratropium  0.5 mg Nebulization BID  . isosorbide mononitrate  30 mg Oral Daily  . levalbuterol  1.25 mg Nebulization BID  . lisinopril  20 mg Oral QHS  . pantoprazole  40 mg Oral BID  . sevelamer carbonate  7.2 g Oral TID WC  . sodium chloride flush  3 mL Intravenous Q12H  . sodium chloride flush  3 mL Intravenous Q12H   Continuous Infusions: . sodium chloride    . sodium chloride    . sodium chloride    . sodium chloride    . sodium chloride     PRN Meds: sodium chloride, sodium chloride, sodium chloride, sodium chloride, acetaminophen, alteplase, calcium acetate, heparin, heparin, hydrALAZINE, lidocaine (PF), lidocaine-prilocaine, ondansetron (ZOFRAN) IV, pentafluoroprop-tetrafluoroeth, sodium chloride flush, sodium chloride flush, sucroferric oxyhydroxide   Vital Signs    Vitals:   02/10/18 2259 02/10/18 2336 02/11/18 0119 02/11/18 0516  BP: 116/72 (!) 142/80  (!) 165/93  Pulse: 92   96  Resp: (!) 33 20  18  Temp: 98.7 F (37.1 C) 97.6 F (36.4 C)  98.7 F (37.1 C)  TempSrc: Oral Oral  Oral  SpO2: 99% 96% 99% 99%  Weight: 86.9 kg     Height:         Intake/Output Summary (Last 24 hours) at 02/11/2018 0900 Last data filed at 02/10/2018 2235 Gross per 24 hour  Intake -  Output 2505 ml  Net -2505 ml   Filed Weights   02/10/18 1801 02/10/18 1925 02/10/18 2259  Weight: 89.4 kg 89.4 kg 86.9 kg    Telemetry    SR, PACs, PVCs and pairs - Personally Reviewed  ECG    12/13, SR, HR  - Personally Reviewed  Physical Exam   General: Well developed, well nourished, male appearing in no acute distress. Head: Normocephalic, atraumatic.  Neck: Supple without bruits, JVD not elevated. Lungs:  Resp regular and unlabored, CTA. Heart: RRR, S1, S2, no S3, S4, 2/6 murmur; no rub. Abdomen: Soft, non-tender, non-distended with normoactive bowel sounds. No hepatomegaly. No rebound/guarding. No obvious abdominal masses. Extremities: No clubbing, cyanosis, no edema. Distal pedal pulses are 2+ bilaterally. Neuro: Alert and oriented X 3. Moves all extremities spontaneously. Psych: Normal affect.  Labs    Hematology Recent Labs  Lab 02/10/18 0909 02/10/18 0912 02/11/18 0522  WBC 8.3  --  7.4  RBC 4.29  --  3.68*  HGB 10.9* 12.2* 9.3*  HCT 37.7* 36.0* 31.6*  MCV 87.9  --  85.9  MCH 25.4*  --  25.3*  MCHC 28.9*  --  29.4*  RDW 17.9*  --  18.2*  PLT 293  --  252    Chemistry Recent Labs  Lab 02/10/18 0909 02/10/18 0912 02/11/18 0522  NA 140 138 140  K 4.7 4.6 4.2  CL 98 103 99  CO2 24  --  26  GLUCOSE 108* 104* 105*  BUN 57* 52* 37*  CREATININE 11.46* 12.30* 8.55*  CALCIUM 9.0  --  8.8*  PROT  --   --  6.1*  ALBUMIN  --   --  3.1*  AST  --   --  18  ALT  --   --  15  ALKPHOS  --   --  40  BILITOT  --   --  0.6  GFRNONAA 4*  --  5*  GFRAA 4*  --  6*  ANIONGAP 18*  --  15     Cardiac Enzymes Recent Labs  Lab 02/10/18 1512 02/10/18 2338 02/11/18 0522  TROPONINI 0.11* 0.10* 0.13*    Recent Labs  Lab 02/10/18 0910  TROPIPOC 0.11*     BNP Recent Labs  Lab 02/10/18 0909  BNP 922.6*    Lab Results   Component Value Date   CHOL 96 12/10/2013   HDL 22 (L) 12/10/2013   LDLCALC 36 12/10/2013   TRIG 190 (H) 12/10/2013   CHOLHDL 4.4 12/10/2013     Radiology    Ct Angio Chest Pe W/cm &/or Wo Cm  Result Date: 02/10/2018 CLINICAL DATA:  Chest pain, shortness of Breath EXAM: CT ANGIOGRAPHY CHEST WITH CONTRAST TECHNIQUE: Multidetector CT imaging of the chest was performed using the standard protocol during bolus administration of intravenous contrast. Multiplanar CT image reconstructions and MIPs were obtained to evaluate the vascular anatomy. CONTRAST:  100 mL Isovue 370 IV ISOVUE-370 IOPAMIDOL (ISOVUE-370) INJECTION 76% COMPARISON:  05/15/2016 FINDINGS: Cardiovascular: Pacer wires noted in the right heart. Diffuse coronary artery calcifications. Moderate aortic calcifications. No aneurysm. Heart is mildly enlarged. No filling defects in the pulmonary arteries to suggest pulmonary emboli. Mediastinum/Nodes: No mediastinal, hilar, or axillary adenopathy. Lungs/Pleura: Small bilateral effusions. Bilateral lower lobe airspace opacities with ground-glass opacities also noted in the inferior upper lobes. Findings likely reflect early edema and/or atelectasis. Upper Abdomen: Imaging into the upper abdomen shows no acute findings. Musculoskeletal: Bilateral gynecomastia.  No acute bony abnormality. Review of the MIP images confirms the above findings. IMPRESSION: No evidence of pulmonary embolus. Cardiomegaly. Bilateral ground-glass opacities and lower lobe airspace opacities most likely edema. Some of the bibasilar opacities could reflect atelectasis. Small effusions. Diffuse coronary artery disease. Aortic Atherosclerosis (ICD10-I70.0). Electronically Signed   By: Rolm Baptise M.D.   On: 02/10/2018 11:13   Dg Chest Port 1 View  Result Date: 02/10/2018 CLINICAL DATA:  One-week history of cough and chest congestion and acute shortness of breath that began earlier today. Current history of diabetes,  hypertension and end-stage renal disease on hemodialysis. EXAM: PORTABLE CHEST 1 VIEW COMPARISON:  12/06/2017 and earlier. FINDINGS: Cardiac silhouette moderately enlarged, unchanged. RIGHT subclavian dual lead transvenous pacemaker unchanged. Interval development of mild diffuse interstitial pulmonary edema. No confluent airspace consolidation. Possible small BILATERAL pleural effusions. IMPRESSION: Mild CHF, with stable moderate cardiomegaly and mild diffuse interstitial pulmonary edema. Possible small BILATERAL effusions. Electronically Signed   By: Evangeline Dakin M.D.   On: 02/10/2018 09:31     Cardiac Studies   ECHO:  02/10/2018 - Left ventricle: The cavity size was normal.  Wall thickness was   increased in a pattern of moderate LVH. Systolic function was   normal. The estimated ejection fraction was in the range of 60%   to 65%. Wall motion was normal; there were no regional wall   motion abnormalities. Doppler parameters are consistent with   pseudonormal left ventricular relaxation (grade 2 diastolic   dysfunction). The E/e&' ratio is >15, suggesting elevated LV   filling pressure. - Aortic valve: Calcified with restricted leaflet motion. Mild   stenosis. There was trivial regurgitation. Mean gradient (S): 13   mm Hg. Peak gradient (S): 24 mm Hg. Valve area (VTI): 1.53 cm^2.   Valve area (Vmax): 1.42 cm^2. Valve area (Vmean): 1.54 cm^2. - Mitral valve: Calcified annulus. Mildly thickened leaflets .   There was mild regurgitation. - Left atrium: Severely dilated. - Systemic veins: The IVC measures <2.1 cm, but does not collapse   >50%, suggesting an elevated RA pressure of 8 mmHg. - Pericardium, extracardiac: A trivial pericardial effusion was   identified posterior to the heart.  Impressions:  - Compared to a prior study in 04/2017, the LVEF is slightly lower,   but normal at 60-65%. THere is mild to moderate aortic stenosis   with a lower mean gradient of 13  mmHg.  Patient Profile     84 y.o. male w/ hx intermittent CHB s/p MDT PPM, ESRD on HD M-W-F, DM, HTN, D-CHF, OA, prostate CA, CVA, lap choley 12/02/2017 w/out cardiac complications, was admitted 12/12 w/ chest pain.   Assessment & Plan    1. NSTEMI:  - ez have remained mildly elevated - had CP at HD last pm, c/w pre-admit sx - on subcu heparin, Imdur, ASA, high-dose statin - HR is never low and believe BP will tolerate, add metoprolol 12.5 mg bid - cath scheduled for 10:30 am.  Otherwise, per IM Principal Problem:   Chest pain Active Problems:   Anemia   DM (diabetes mellitus), type 2 with renal complications, diet controlled   HTN (hypertension)   Hyperlipidemia   H/O: stroke   ESRD on hemodialysis ([place])   Elevated troponin   Gout    Signed, Rosaria Ferries , PA-C 9:00 AM 02/11/2018 Pager: [phone]  I have personally seen and examined this patient. I agree with the assessment and plan as outlined above.  No chest pain this am. He did have chest pain on HD last night. Troponin mildly elevated with flat trend. Cardiac cath this am with possible PCI.   Lauree Chandler 02/11/2018 10:24 AM

## 2018-02-11 NOTE — Progress Notes (Signed)
Schorr PA made aware of critical troponin result of 0.13

## 2018-02-11 NOTE — Progress Notes (Signed)
PROGRESS NOTE  Sean Best GMW:[id number] DOB: August 31, 1933 DOA: 02/10/2018 PCP: Seward Carol, MD   LOS: 0 days   Brief narrative:  Patient is 84 years old male with past medical history of anemia, peripheral vascular disease, end-stage renal disease on hemodialysis, hyperlipidemia, history of pacemaker placement, history of prostate cancer, type 2 diabetes, presented to the hospital with complaints of burning precordial chest pain with dyspnea and cough.  Recently treated for influenza last week.  Patient had missed her hemodialysis for 1 session due to family emergency.  Patient was then admitted to hospital for chest pain.  Assessment/Plan:  Principal Problem:   Chest pain Active Problems:   Anemia   DM (diabetes mellitus), type 2 with renal complications, diet controlled   HTN (hypertension)   Hyperlipidemia   H/O: stroke   ESRD on hemodialysis (HCC)   Elevated troponin   Gout   Chest pain likely non-ST elevation MI.  Minimally elevated troponins.  Cardiology has seen the patient and patient has been scheduled for cardiac catheterization today.  Continue on Imdur, aspirin, statins.  End-stage renal disease on hemodialysis.  Nephrology on board.  Patient goes to hemodialysis on Monday Wednesday Friday.  Anemia of chronic kidney disease.  Nephrology on board.  Diabetes mellitus type 2.  Continue on diabetic diet, sliding scale insulin.  Hypertension.  Continue amlodipine and lisinopril.  Closely monitor blood pressure.  Hyperlipidemia continue statins.  Gout continue allopurinol.   VTE Prophylaxis: Heparin subcu  Code Status:  Full code  Family Communication: No one available at bedside  Disposition Plan: Home in 1 to 2 days   Consultants:  Cardiology  Procedures:  Cardiac catheterization planned 02/11/2018  Antibiotics:  None  Subjective: Patient denied any chest pain at this time.  Has mild dyspnea.  Objective: Vitals:   02/11/18 1335 02/11/18  1419  BP: (!) 197/106 (!) 186/104  Pulse: 89   Resp: 19   Temp:    SpO2: 99%     Intake/Output Summary (Last 24 hours) at 02/11/2018 1535 Last data filed at 02/11/2018 0916 Gross per 24 hour  Intake 6 ml  Output 2505 ml  Net -2499 ml   Filed Weights   02/10/18 1801 02/10/18 1925 02/10/18 2259  Weight: 89.4 kg 89.4 kg 86.9 kg   Physical Examination: General exam: Appears calm and comfortable ,Not in distress.  On nasal cannula. HEENT:PERRL,Oral mucosa moist Respiratory system: Bilateral equal air entry, normal vesicular breath sounds, no wheezes or crackles  Cardiovascular system: S1 & S2 heard, systolic ejection murmur.  Right chest wall pacemaker in place. Gastrointestinal system: Abdomen is nondistended, soft and nontender. No organomegaly or masses felt. Normal bowel sounds heard. Central nervous system: Alert and oriented. No focal neurological deficits. Extremities: No edema, no clubbing ,no cyanosis, distal peripheral pulses palpable. Skin: No rashes, lesions or ulcers,no icterus ,no pallor MSK: Normal muscle bulk,tone ,power   Data Review: I have personally reviewed the following laboratory data and studies,  CBC: Recent Labs  Lab 02/10/18 0909 02/10/18 0912 02/11/18 0522  WBC 8.3  --  7.4  NEUTROABS 5.8  --   --   HGB 10.9* 12.2* 9.3*  HCT 37.7* 36.0* 31.6*  MCV 87.9  --  85.9  PLT 293  --  440   Basic Metabolic Panel: Recent Labs  Lab 02/10/18 0909 02/10/18 0912 02/11/18 0522  NA 140 138 140  K 4.7 4.6 4.2  CL 98 103 99  CO2 24  --  26  GLUCOSE 108* 104*  105*  BUN 57* 52* 37*  CREATININE 11.46* 12.30* 8.55*  CALCIUM 9.0  --  8.8*   Liver Function Tests: Recent Labs  Lab 02/11/18 0522  AST 18  ALT 15  ALKPHOS 40  BILITOT 0.6  PROT 6.1*  ALBUMIN 3.1*   No results for input(s): LIPASE, AMYLASE in the last 168 hours. No results for input(s): AMMONIA in the last 168 hours. Cardiac Enzymes: Recent Labs  Lab 02/10/18 1512 02/10/18 2338  02/11/18 0522  TROPONINI 0.11* 0.10* 0.13*   BNP (last 3 results) Recent Labs    04/17/17 1800 09/01/17 2101 02/10/18 0909  BNP 2,812.2* 104.8* 922.6*    ProBNP (last 3 results) No results for input(s): PROBNP in the last 8760 hours.  CBG: Recent Labs  Lab 02/10/18 1730 02/10/18 2334 02/11/18 0643  GLUCAP 84 100* 96   No results found for this or any previous visit (from the past 240 hour(s)).   Studies: Ct Angio Chest Pe W/cm &/or Wo Cm  Result Date: 02/10/2018 CLINICAL DATA:  Chest pain, shortness of Breath EXAM: CT ANGIOGRAPHY CHEST WITH CONTRAST TECHNIQUE: Multidetector CT imaging of the chest was performed using the standard protocol during bolus administration of intravenous contrast. Multiplanar CT image reconstructions and MIPs were obtained to evaluate the vascular anatomy. CONTRAST:  100 mL Isovue 370 IV ISOVUE-370 IOPAMIDOL (ISOVUE-370) INJECTION 76% COMPARISON:  05/15/2016 FINDINGS: Cardiovascular: Pacer wires noted in the right heart. Diffuse coronary artery calcifications. Moderate aortic calcifications. No aneurysm. Heart is mildly enlarged. No filling defects in the pulmonary arteries to suggest pulmonary emboli. Mediastinum/Nodes: No mediastinal, hilar, or axillary adenopathy. Lungs/Pleura: Small bilateral effusions. Bilateral lower lobe airspace opacities with ground-glass opacities also noted in the inferior upper lobes. Findings likely reflect early edema and/or atelectasis. Upper Abdomen: Imaging into the upper abdomen shows no acute findings. Musculoskeletal: Bilateral gynecomastia.  No acute bony abnormality. Review of the MIP images confirms the above findings. IMPRESSION: No evidence of pulmonary embolus. Cardiomegaly. Bilateral ground-glass opacities and lower lobe airspace opacities most likely edema. Some of the bibasilar opacities could reflect atelectasis. Small effusions. Diffuse coronary artery disease. Aortic Atherosclerosis (ICD10-I70.0). Electronically  Signed   By: Rolm Baptise M.D.   On: 02/10/2018 11:13   Dg Chest Port 1 View  Result Date: 02/10/2018 CLINICAL DATA:  One-week history of cough and chest congestion and acute shortness of breath that began earlier today. Current history of diabetes, hypertension and end-stage renal disease on hemodialysis. EXAM: PORTABLE CHEST 1 VIEW COMPARISON:  12/06/2017 and earlier. FINDINGS: Cardiac silhouette moderately enlarged, unchanged. RIGHT subclavian dual lead transvenous pacemaker unchanged. Interval development of mild diffuse interstitial pulmonary edema. No confluent airspace consolidation. Possible small BILATERAL pleural effusions. IMPRESSION: Mild CHF, with stable moderate cardiomegaly and mild diffuse interstitial pulmonary edema. Possible small BILATERAL effusions. Electronically Signed   By: Evangeline Dakin M.D.   On: 02/10/2018 09:31    Scheduled Meds: . [MAR Hold] allopurinol  100 mg Oral Daily  . [MAR Hold] amLODipine  10 mg Oral QHS  . [MAR Hold] atorvastatin  40 mg Oral QPM  . [MAR Hold] budesonide (PULMICORT) nebulizer solution  0.5 mg Nebulization BID  . [MAR Hold] calcitRIOL  0.5 mcg Oral Once per day on Mon Wed Fri  . [MAR Hold] calcium acetate  2,001 mg Oral TID WC  . [MAR Hold] calcium carbonate  4 tablet Oral BID  . [MAR Hold] Chlorhexidine Gluconate Cloth  6 each Topical Q0600  . [MAR Hold] cholecalciferol  1,000 Units Oral Daily  . [  MAR Hold] cinacalcet  150 mg Oral Q M,W,F  . [MAR Hold] gabapentin  100 mg Oral QHS  . [MAR Hold] heparin  5,000 Units Subcutaneous Q8H  . [MAR Hold] ipratropium  0.5 mg Nebulization BID  . [MAR Hold] isosorbide mononitrate  30 mg Oral Daily  . [MAR Hold] levalbuterol  1.25 mg Nebulization BID  . [MAR Hold] lisinopril  20 mg Oral QHS  . [MAR Hold] pantoprazole  40 mg Oral BID  . [MAR Hold] sevelamer carbonate  7.2 g Oral TID WC  . [MAR Hold] sodium chloride flush  3 mL Intravenous Q12H  . sodium chloride flush  3 mL Intravenous Q12H    Continuous Infusions: . [MAR Hold] sodium chloride    . [MAR Hold] sodium chloride    . [MAR Hold] sodium chloride    . sodium chloride    . sodium chloride Stopped (02/11/18 1220)    Fowler Antos  [facility] Pager [phone]  If 7PM-7AM, please contact night-coverage at [email or web address], password [facility] 02/11/2018, 3:35 PM

## 2018-02-12 DIAGNOSIS — J810 Acute pulmonary edema: Secondary | ICD-10-CM

## 2018-02-12 DIAGNOSIS — I200 Unstable angina: Secondary | ICD-10-CM

## 2018-02-12 DIAGNOSIS — Z955 Presence of coronary angioplasty implant and graft: Secondary | ICD-10-CM

## 2018-02-12 DIAGNOSIS — M109 Gout, unspecified: Secondary | ICD-10-CM

## 2018-02-12 LAB — RENAL FUNCTION PANEL
Albumin: 3.3 g/dL — ABNORMAL LOW (ref 3.5–5.0)
Anion gap: 16 — ABNORMAL HIGH (ref 5–15)
BUN: 49 mg/dL — ABNORMAL HIGH (ref 8–23)
CO2: 23 mmol/L (ref 22–32)
Calcium: 9.6 mg/dL (ref 8.9–10.3)
Chloride: 98 mmol/L (ref 98–111)
Creatinine, Ser: 10.14 mg/dL — ABNORMAL HIGH (ref 0.61–1.24)
GFR calc Af Amer: 5 mL/min — ABNORMAL LOW (ref >60)
GFR calc non Af Amer: 4 mL/min — ABNORMAL LOW (ref >60)
Glucose, Bld: 100 mg/dL — ABNORMAL HIGH (ref 70–99)
Phosphorus: 6.2 mg/dL — ABNORMAL HIGH (ref 2.5–4.6)
Potassium: 4.1 mmol/L (ref 3.5–5.1)
Sodium: 137 mmol/L (ref 135–145)

## 2018-02-12 LAB — CBC
HCT: 31.5 % — ABNORMAL LOW (ref 39.0–52.0)
Hemoglobin: 9.5 g/dL — ABNORMAL LOW (ref 13.0–17.0)
MCH: 25.7 pg — ABNORMAL LOW (ref 26.0–34.0)
MCHC: 30.2 g/dL (ref 30.0–36.0)
MCV: 85.1 fL (ref 80.0–100.0)
Platelets: 235 10*3/uL (ref 150–400)
RBC: 3.7 MIL/uL — ABNORMAL LOW (ref 4.22–5.81)
RDW: 18.4 % — ABNORMAL HIGH (ref 11.5–15.5)
WBC: 7.9 10*3/uL (ref 4.0–10.5)
nRBC: 0 % (ref 0.0–0.2)

## 2018-02-12 LAB — GLUCOSE, CAPILLARY
Glucose-Capillary: 85 mg/dL (ref 70–99)
Glucose-Capillary: 97 mg/dL (ref 70–99)
Glucose-Capillary: 92 mg/dL (ref 70–99)
Glucose-Capillary: 105 mg/dL — ABNORMAL HIGH (ref 70–99)

## 2018-02-12 LAB — MRSA PCR SCREENING: MRSA by PCR: NEGATIVE

## 2018-02-12 MED ORDER — INSULIN ASPART 100 UNIT/ML ~~LOC~~ SOLN: 0.0000 [IU] | Freq: Three times a day (TID) | SUBCUTANEOUS | Status: DC

## 2018-02-12 MED ORDER — LEVALBUTEROL HCL 0.63 MG/3ML IN NEBU
INHALATION_SOLUTION | RESPIRATORY_TRACT | Status: AC
  Filled 2018-02-12: qty 3

## 2018-02-12 MED ORDER — DARBEPOETIN ALFA 150 MCG/0.3ML IJ SOSY
150.0000 ug | PREFILLED_SYRINGE | INTRAMUSCULAR | Status: DC
  Administered 2018-02-12: 150 ug via INTRAVENOUS
  Filled 2018-02-12: qty 0.3

## 2018-02-12 MED ORDER — CALCITRIOL 0.5 MCG PO CAPS
ORAL_CAPSULE | ORAL | Status: AC
  Filled 2018-02-12: qty 1

## 2018-02-12 MED ORDER — LEVALBUTEROL HCL 0.63 MG/3ML IN NEBU
0.6300 mg | INHALATION_SOLUTION | Freq: Four times a day (QID) | RESPIRATORY_TRACT | Status: DC | PRN
  Administered 2018-02-12: 04:00:00 0.63 mg via RESPIRATORY_TRACT

## 2018-02-12 MED ORDER — HEPARIN SODIUM (PORCINE) 1000 UNIT/ML DIALYSIS
20.0000 [IU]/kg | INTRAMUSCULAR | Status: DC | PRN
  Filled 2018-02-12: qty 2

## 2018-02-12 MED ORDER — IPRATROPIUM BROMIDE 0.02 % IN SOLN: 0.5000 mg | Freq: Four times a day (QID) | RESPIRATORY_TRACT | Status: DC | PRN

## 2018-02-12 MED ORDER — DARBEPOETIN ALFA 150 MCG/0.3ML IJ SOSY
PREFILLED_SYRINGE | INTRAMUSCULAR | Status: AC
  Administered 2018-02-12: 150 ug via INTRAVENOUS
  Filled 2018-02-12: qty 0.3

## 2018-02-12 NOTE — Progress Notes (Addendum)
Subjective:  On hd ,no cp ,sob earlier am now resolved on HD / noted yest c cath Objective Vital signs in last 24 hours: Vitals:   02/12/18 0433 02/12/18 0645 02/12/18 0650 02/12/18 0700  BP: (!) 151/92 (!) 144/86 138/76 (!) 151/82  Pulse: 93 99 92 90  Resp: (!) 21 18 17 16   Temp:  98.2 F (36.8 C) 98.2 F (36.8 C)   TempSrc:  Oral Oral   SpO2: 100% 94% 96% 96%  Weight:  92 kg    Height:       Weight change: -2.7 kg  Physical Exam: General:Well developed,NAD , OX3 Appropriate  Neck . JVD not elevated. Lungs:CTA  bilat  Heart:RRR ,2/6 sem ,no rub or gallop. Abdomen: Soft, non-tender, non-distended, BS +.   Extremities: No pedal edema  Dialysis Access:L forearm AVF + thrill  Dialysis Orders: MWF at [place], 450/A2.0, EDW 89.5kg, 2K/2Ca, AVF, heparin 6000 bolus - Mircera 158mcg IV q 2 weeks (last 01/19/18) - Sensipar 150mg  PO q HD - Calcitriol 0.38mcg PO q HD  Problem/Plan:: 1. Dyspnea/Pulm edema:In setting of missed HD /CAD (new dx) With stenting  L Cx yest  ) wt loss recent viral illness = lower edw , hd today tolerating uf  Fu wts bp after hd  2. ESRD:Usual MWF sched. Short HD 12/12 For vol and today, then back to usual MWF sched. 3. CAD= cath 12/13=Severe single-vessel CAD involving the left circumflex/obtuse marginal branch, treated successfully with 2 drug-eluting stents,Mild nonobstructive stenosis of the RCA and LAD/Normal LV systolic function and mild to moderate aortic stenosis by noninvasive assessment 4. Hypertension/volume:BP ^ with volume excess,lower edw as above as able/ on Lisinopril 20 mg hs and amlodipine 10 mg hs Imdur 30 mg qd monitor bp on hd ,MAY need to taper down  If bp low on hd and ot able to uf  5. Anemia:Hgb 12.2 on admit > 9.3 >9.5 today  Recheck pre hd / give 150 Aranesp today  6. Metabolic bone disease:Ca ok, Phos 6.2 . Continue home binders, will resume sensipar/VDRA with HD. 7. Type 2 DM 8. CAD: Trop 0.11 - up from  baseline. ?Demand ischemia. Per primary. 9. Hx pacemaker  Ernest Haber, PA-C [place] [phone] 02/12/2018,9:33 AM  LOS: 1 day   Pt seen, examined and agree w A/P as above.  Kelly Splinter MD [facility] pager [phone]   02/12/2018, 3:15 PM    Labs: Basic Metabolic Panel: Recent Labs  Lab 02/10/18 0909 02/10/18 0912 02/11/18 0522 02/12/18 0840  NA 140 138 140 137  K 4.7 4.6 4.2 4.1  CL 98 103 99 98  CO2 24  --  26 23  GLUCOSE 108* 104* 105* 100*  BUN 57* 52* 37* 49*  CREATININE 11.46* 12.30* 8.55* 10.14*  CALCIUM 9.0  --  8.8* 9.6  PHOS  --   --   --  6.2*   Liver Function Tests: Recent Labs  Lab 02/11/18 0522 02/12/18 0840  AST 18  --   ALT 15  --   ALKPHOS 40  --   BILITOT 0.6  --   PROT 6.1*  --   ALBUMIN 3.1* 3.3*   No results for input(s): LIPASE, AMYLASE in the last 168 hours. No results for input(s): AMMONIA in the last 168 hours. CBC: Recent Labs  Lab 02/10/18 0909 02/10/18 0912 02/11/18 0522 02/12/18 0840  WBC 8.3  --  7.4 7.9  NEUTROABS 5.8  --   --   --  HGB 10.9* 12.2* 9.3* 9.5*  HCT 37.7* 36.0* 31.6* 31.5*  MCV 87.9  --  85.9 85.1  PLT 293  --  252 235   Cardiac Enzymes: Recent Labs  Lab 02/10/18 1512 02/10/18 2338 02/11/18 0522  TROPONINI 0.11* 0.10* 0.13*   CBG: Recent Labs  Lab 02/10/18 1730 02/10/18 2334 02/11/18 0643 02/11/18 2120 02/12/18 0615  GLUCAP 84 100* 96 77 97    Studies/Results: Ct Angio Chest Pe W/cm &/or Wo Cm  Result Date: 02/10/2018 CLINICAL DATA:  Chest pain, shortness of Breath EXAM: CT ANGIOGRAPHY CHEST WITH CONTRAST TECHNIQUE: Multidetector CT imaging of the chest was performed using the standard protocol during bolus administration of intravenous contrast. Multiplanar CT image reconstructions and MIPs were obtained to evaluate the vascular anatomy. CONTRAST:  100 mL Isovue 370 IV ISOVUE-370 IOPAMIDOL (ISOVUE-370) INJECTION 76% COMPARISON:  05/15/2016  FINDINGS: Cardiovascular: Pacer wires noted in the right heart. Diffuse coronary artery calcifications. Moderate aortic calcifications. No aneurysm. Heart is mildly enlarged. No filling defects in the pulmonary arteries to suggest pulmonary emboli. Mediastinum/Nodes: No mediastinal, hilar, or axillary adenopathy. Lungs/Pleura: Small bilateral effusions. Bilateral lower lobe airspace opacities with ground-glass opacities also noted in the inferior upper lobes. Findings likely reflect early edema and/or atelectasis. Upper Abdomen: Imaging into the upper abdomen shows no acute findings. Musculoskeletal: Bilateral gynecomastia.  No acute bony abnormality. Review of the MIP images confirms the above findings. IMPRESSION: No evidence of pulmonary embolus. Cardiomegaly. Bilateral ground-glass opacities and lower lobe airspace opacities most likely edema. Some of the bibasilar opacities could reflect atelectasis. Small effusions. Diffuse coronary artery disease. Aortic Atherosclerosis (ICD10-I70.0). Electronically Signed   By: Rolm Baptise M.D.   On: 02/10/2018 11:13   Medications: . sodium chloride    . sodium chloride    . sodium chloride    . sodium chloride     . allopurinol  100 mg Oral Daily  . amLODipine  10 mg Oral QHS  . aspirin  81 mg Oral Daily  . atorvastatin  40 mg Oral QPM  . budesonide (PULMICORT) nebulizer solution  0.5 mg Nebulization BID  . calcitRIOL  0.5 mcg Oral Once per day on Mon Wed Fri  . calcium acetate  2,001 mg Oral TID WC  . calcium carbonate  4 tablet Oral BID  . Chlorhexidine Gluconate Cloth  6 each Topical Q0600  . cholecalciferol  1,000 Units Oral Daily  . cinacalcet  150 mg Oral Q M,W,F  . gabapentin  100 mg Oral QHS  . heparin  5,000 Units Subcutaneous Q8H  . ipratropium  0.5 mg Nebulization BID  . isosorbide mononitrate  30 mg Oral Daily  . levalbuterol  1.25 mg Nebulization BID  . lisinopril  20 mg Oral QHS  . pantoprazole  40 mg Oral BID  . sevelamer carbonate   7.2 g Oral TID WC  . sodium chloride flush  3 mL Intravenous Q12H  . sodium chloride flush  3 mL Intravenous Q12H  . ticagrelor  90 mg Oral BID

## 2018-02-12 NOTE — Progress Notes (Signed)
Progress Note  Patient Name: Sean Best Date of Encounter: 02/12/2018  Primary Cardiologist: Pixie Casino, MD   Subjective   Tired but no chest pain.  No shortness of breath.  Inpatient Medications    Scheduled Meds: . allopurinol  100 mg Oral Daily  . amLODipine  10 mg Oral QHS  . aspirin  81 mg Oral Daily  . atorvastatin  40 mg Oral QPM  . budesonide (PULMICORT) nebulizer solution  0.5 mg Nebulization BID  . calcitRIOL  0.5 mcg Oral Once per day on Mon Wed Fri  . calcium acetate  2,001 mg Oral TID WC  . calcium carbonate  4 tablet Oral BID  . Chlorhexidine Gluconate Cloth  6 each Topical Q0600  . cholecalciferol  1,000 Units Oral Daily  . cinacalcet  150 mg Oral Q M,W,F  . darbepoetin (ARANESP) injection - DIALYSIS  150 mcg Intravenous Q Sat-HD  . gabapentin  100 mg Oral QHS  . heparin  5,000 Units Subcutaneous Q8H  . ipratropium  0.5 mg Nebulization BID  . isosorbide mononitrate  30 mg Oral Daily  . levalbuterol  1.25 mg Nebulization BID  . lisinopril  20 mg Oral QHS  . pantoprazole  40 mg Oral BID  . sevelamer carbonate  7.2 g Oral TID WC  . sodium chloride flush  3 mL Intravenous Q12H  . sodium chloride flush  3 mL Intravenous Q12H  . ticagrelor  90 mg Oral BID   Continuous Infusions: . sodium chloride    . sodium chloride    . sodium chloride    . sodium chloride     PRN Meds: sodium chloride, sodium chloride, sodium chloride, sodium chloride, acetaminophen, alteplase, calcium acetate, heparin, heparin, hydrALAZINE, levalbuterol, lidocaine (PF), lidocaine-prilocaine, ondansetron (ZOFRAN) IV, pentafluoroprop-tetrafluoroeth, sodium chloride flush, sodium chloride flush, sucroferric oxyhydroxide   Vital Signs    Vitals:   02/12/18 1000 02/12/18 1030 02/12/18 1050 02/12/18 1100  BP: (!) 116/50 129/79 102/66   Pulse: 84 97 79   Resp:   18   Temp:   98.2 F (36.8 C)   TempSrc:   Oral   SpO2:  99% 99%   Weight:    88.8 kg  Height:         Intake/Output Summary (Last 24 hours) at 02/12/2018 1405 Last data filed at 02/12/2018 1050 Gross per 24 hour  Intake 240 ml  Output 3379 ml  Net -3139 ml   Filed Weights   02/12/18 0414 02/12/18 0645 02/12/18 1100  Weight: 92 kg 92 kg 88.8 kg    Telemetry    No adverse arrhythmias- Personally Reviewed  ECG    Left bundle branch block sinus rhythm with PACs- Personally Reviewed  Physical Exam   GEN: No acute distress.   Neck: No JVD Cardiac: RRR, no murmurs, rubs, or gallops.  Respiratory: Clear to auscultation bilaterally. GI: Soft, nontender, non-distended  MS: No edema; No deformity. Neuro:  Nonfocal  Psych: Normal affect   Labs    Chemistry Recent Labs  Lab 02/10/18 0909 02/10/18 0912 02/11/18 0522 02/12/18 0840  NA 140 138 140 137  K 4.7 4.6 4.2 4.1  CL 98 103 99 98  CO2 24  --  26 23  GLUCOSE 108* 104* 105* 100*  BUN 57* 52* 37* 49*  CREATININE 11.46* 12.30* 8.55* 10.14*  CALCIUM 9.0  --  8.8* 9.6  PROT  --   --  6.1*  --   ALBUMIN  --   --  3.1* 3.3*  AST  --   --  18  --   ALT  --   --  15  --   ALKPHOS  --   --  40  --   BILITOT  --   --  0.6  --   GFRNONAA 4*  --  5* 4*  GFRAA 4*  --  6* 5*  ANIONGAP 18*  --  15 16*     Hematology Recent Labs  Lab 02/10/18 0909 02/10/18 0912 02/11/18 0522 02/12/18 0840  WBC 8.3  --  7.4 7.9  RBC 4.29  --  3.68* 3.70*  HGB 10.9* 12.2* 9.3* 9.5*  HCT 37.7* 36.0* 31.6* 31.5*  MCV 87.9  --  85.9 85.1  MCH 25.4*  --  25.3* 25.7*  MCHC 28.9*  --  29.4* 30.2  RDW 17.9*  --  18.2* 18.4*  PLT 293  --  252 235    Cardiac Enzymes Recent Labs  Lab 02/10/18 1512 02/10/18 2338 02/11/18 0522  TROPONINI 0.11* 0.10* 0.13*    Recent Labs  Lab 02/10/18 0910  TROPIPOC 0.11*     BNP Recent Labs  Lab 02/10/18 0909  BNP 922.6*     DDimer No results for input(s): DDIMER in the last 168 hours.   Radiology    No results found.  Cardiac Studies   Cardiac catheterization 02/11/2018 1.   Severe single-vessel coronary artery disease involving the left circumflex/obtuse marginal branch, treated successfully with 2 drug-eluting stents 2.  Mild nonobstructive stenosis of the RCA and LAD 3.  Normal LV systolic function and mild to moderate aortic stenosis by noninvasive assessment  Recommendations: Dual antiplatelet therapy with aspirin and ticagrelor for 12 months without interruption.  Ticagrelor 180 mg is administered in the cardiac Cath Lab.  Patient Profile     84 y.o. male with severe coronary artery disease single-vessel treated with 2 drug-eluting stents to the circumflex.  Nonobstructive disease elsewhere.  Normal EF.  Assessment & Plan    Coronary artery disease -Circumflex DES x2 -Continue with dual antiplatelet therapy for 1 year.  Watch for any signs of bleeding. -Cardiac rehabilitation.  Normal EF - Aspirin, Brilinta, atorvastatin 40 mg, isosorbide, lisinopril  End-stage renal disease -On hemodialysis.  Watch for signs of bleeding on Brilinta.  Non-ST elevation myocardial infarction - Troponin mildly elevated  Left bundle branch block - Watch for further deterioration of electrical system.  Okay for discharge  [facility] will sign off.   Medication Recommendations: As above Other recommendations (labs, testing, etc): None Follow up as an outpatient: In 2 to 4 weeks, we will set up  For questions or updates, please contact [place] Please consult [email or web address] for contact info under        Signed, Candee Furbish, MD  02/12/2018, 2:05 PM

## 2018-02-12 NOTE — Progress Notes (Signed)
PROGRESS NOTE  Sean Best SAY:[id number] DOB: 08/25/1933 DOA: 02/10/2018 PCP: Seward Carol, MD  HPI/Brief Narrative  Sean Best is a 84 y.o. year old male with medical history significant for anemia, , PAD, ESRD on hemodialysis MWF, hyperlipidemia, hypertension, pacemaker placement, history of prostate CA, history of herpes zoster, history of CVA, type 2 diabetes, umbilical hernia who presented on 02/10/2018 with burning chest pain with associated cough and dyspnea x1 day.  Of note patient was recently treated at urgent care approximately a week ago for presumed viral URI.  Additionally patient had missed HD the day prior due to a family emergency.  Was found to have vascular congestion on chest x-ray with small bilateral pleural effusions and elevated troponin concerning for NSTEMI.  He had vascular congestion/pulmonary edema and associated dyspnea which was presumed secondary to missed dialysis and recent URI.  He had no signs of pneumonia on imaging.  CT was negative for PE. Patient underwent consecutive HD sessions during hospital stay with improvement in breathing status.  Given persistent chest pain despite HD treatment patient underwent cath on 12/13 was found to have goal vessel severe coronary artery disease involving the left circumflex/obtuse marginal branch treated successfully with 2 DES*aspirin and Brilinta as directed by cardiology to continue for 12 months.  Subjective Has shortness of breath this morning but is now since resolved with HD session recently completed. Denies any current chest pain Still some right groin pain at site of cath  Assessment/Plan:  #Pulmonary edema, likely multifactorial etiology related to missed HD and CAD, improving.  Not currently requiring oxygen, symptoms significantly improved with dialysis on 12/14 and no abnormalities heard on lung exam.  Decrease duo nebs to as needed from schedule, continue to monitor respiratory status after HD session  to ensure stabilization of the next 24 hours.  #ESRD on HD MWF, improving.  Status post dialysis session today to remove volume with great improvement in dyspnea.  Will resume Monday was referred to schedule as outpatient.  Continue Renvela  #CAD status post PCI (DES x2 to left circumflex) stable.  Currently without chest pain normal LV systolic function and mild aortic stenosis by noninvasive assessment per cardiology.  Continue aspirin and Brilinta x12 months per cardiology.  Tina monitor on telemetry.  Monitor hemoglobin given recent cath and monitor femoral site.  Cardiology plans to follow-up as outpatient  #Hypertension, improving though slightly on the lower side currently.  For most of hospital stay has been more on the hypotensive side with SBP's in the 150s-160s.  Tolerated HD session today most recent blood pressure 102/66.  We will continue lisinopril and Imdur and amlodipine for close monitoring BP.  Closely monitor to ensure no changes need to be made to medications.  Anticipate discharge next 24 hours.  #Chronic normocytic anemia, secondary to anemia of CKD.  Baseline hemoglobin 12.2, on admit 9.3, currently 9.5.  For next 24 hours.  #Type 2 diabetes, A1c 5.5.  Not on any home insulin or oral medications.  Fasting glucose this a.m. 100.  Sliding scale insulin as needed, monitor CBG.  #Hyperlipidemia, stable.  Continue home atorvastatin  COPD, stable.  No wheezing on exam.  Continue scheduled Pulmicort.  PRN Xopenex  #GERD, stable.  Continue home Protonix  Gout, stable.  Continue home allopurinol    Code Status: Full code  Family Communication: No family at bedside, updated his daughter Mr. Vanegas via phone conversation in the presence of patient.  Disposition Plan: Dissipate discharge in next 24 hours, patient  and daughter asked for an additional observation given patient's recent cath and earlier symptoms of dyspnea this morning that have since now improved greatly after HD  session.  We will continue to monitor blood pressure (currently on the lower side after dialysis) continue to monitor ensure no reason to make changes to blood pressure medications prior to discharge.  Also monitoring hemoglobin in setting of recent heart cath via femoral access.   Consultants:   Treatment Team:   Roney Jaffe, MD  [facility], [place], MD    Procedures:  Heart cath, 12/13, DES x2 to left circumflex  Antimicrobials: Anti-infectives (From admission, onward)   None         Cultures:  None  Telemetry: Yes  DVT prophylaxis: SCDs, on aspirin and Brilinta   Objective: Vitals:   02/12/18 1000 02/12/18 1030 02/12/18 1050 02/12/18 1100  BP: (!) 116/50 129/79 102/66   Pulse: 84 97 79   Resp:   18   Temp:   98.2 F (36.8 C)   TempSrc:   Oral   SpO2:  99% 99%   Weight:    88.8 kg  Height:        Intake/Output Summary (Last 24 hours) at 02/12/2018 1511 Last data filed at 02/12/2018 1050 Gross per 24 hour  Intake 240 ml  Output 3379 ml  Net -3139 ml   Filed Weights   02/12/18 0414 02/12/18 0645 02/12/18 1100  Weight: 92 kg 92 kg 88.8 kg    Exam:  Constitutional:normal appearing male Eyes: EOMI, anicteric, normal conjunctivae ENMT: Oropharynx with moist mucous membranes Cardiovascular: RRR, systolic murmur present, with no peripheral edema Respiratory: Normal respiratory effort on room air, clear breath sounds with no wheezing or rales Abdomen: Soft,non-tender, normal bowel sounds Skin: Bruising on right groin, dressing in place femoral site clean dry and intact, somewhat tender to palpation. Neurologic: Grossly no focal neuro deficit. Psychiatric:Appropriate affect, and mood. Mental status AAOx3  Data Reviewed: CBC: Recent Labs  Lab 02/10/18 0909 02/10/18 0912 02/11/18 0522 02/12/18 0840  WBC 8.3  --  7.4 7.9  NEUTROABS 5.8  --   --   --   HGB 10.9* 12.2* 9.3* 9.5*  HCT 37.7* 36.0* 31.6* 31.5*  MCV 87.9  --  85.9 85.1  PLT  293  --  252 185   Basic Metabolic Panel: Recent Labs  Lab 02/10/18 0909 02/10/18 0912 02/11/18 0522 02/12/18 0840  NA 140 138 140 137  K 4.7 4.6 4.2 4.1  CL 98 103 99 98  CO2 24  --  26 23  GLUCOSE 108* 104* 105* 100*  BUN 57* 52* 37* 49*  CREATININE 11.46* 12.30* 8.55* 10.14*  CALCIUM 9.0  --  8.8* 9.6  PHOS  --   --   --  6.2*   GFR: Estimated Creatinine Clearance: 5.8 mL/min (A) (by C-G formula based on SCr of 10.14 mg/dL (H)). Liver Function Tests: Recent Labs  Lab 02/11/18 0522 02/12/18 0840  AST 18  --   ALT 15  --   ALKPHOS 40  --   BILITOT 0.6  --   PROT 6.1*  --   ALBUMIN 3.1* 3.3*   No results for input(s): LIPASE, AMYLASE in the last 168 hours. No results for input(s): AMMONIA in the last 168 hours. Coagulation Profile: Recent Labs  Lab 02/10/18 1524  INR 1.10   Cardiac Enzymes: Recent Labs  Lab 02/10/18 1512 02/10/18 2338 02/11/18 0522  TROPONINI 0.11* 0.10* 0.13*   BNP (last 3 results)  No results for input(s): PROBNP in the last 8760 hours. HbA1C: No results for input(s): HGBA1C in the last 72 hours. CBG: Recent Labs  Lab 02/10/18 2334 02/11/18 0643 02/11/18 1555 02/11/18 2120 02/12/18 0615  GLUCAP 100* 96 85 77 97   Lipid Profile: No results for input(s): CHOL, HDL, LDLCALC, TRIG, CHOLHDL, LDLDIRECT in the last 72 hours. Thyroid Function Tests: No results for input(s): TSH, T4TOTAL, FREET4, T3FREE, THYROIDAB in the last 72 hours. Anemia Panel: No results for input(s): VITAMINB12, FOLATE, FERRITIN, TIBC, IRON, RETICCTPCT in the last 72 hours. Urine analysis:    Component Value Date/Time   COLORURINE YELLOW 12/29/2014 0550   APPEARANCEUR CLOUDY (A) 12/29/2014 0550   LABSPEC 1.020 12/29/2014 0550   PHURINE 5.0 12/29/2014 0550   GLUCOSEU NEGATIVE 12/29/2014 0550   HGBUR SMALL (A) 12/29/2014 0550   BILIRUBINUR NEGATIVE 12/29/2014 0550   KETONESUR NEGATIVE 12/29/2014 0550   PROTEINUR 100 (A) 12/29/2014 0550   UROBILINOGEN 0.2  12/29/2014 0550   NITRITE NEGATIVE 12/29/2014 0550   LEUKOCYTESUR SMALL (A) 12/29/2014 0550   Sepsis Labs: @LABRCNTIP (procalcitonin:4,lacticidven:4)  ) Recent Results (from the past 240 hour(s))  MRSA PCR Screening     Status: Abnormal   Collection Time: 02/11/18  9:06 PM  Result Value Ref Range Status   MRSA by PCR (A) NEGATIVE Final    INVALID, UNABLE TO DETERMINE THE PRESENCE OF TARGET DNA DUE TO SPECIMEN INTEGRITY. RECOLLECTION REQUESTED.    CommentDellie Burns RN [id number] 02/12/18 A BROWNING Performed at [facility] [address] [place], [place] [address]   MRSA PCR Screening     Status: None   Collection Time: 02/12/18 12:59 AM  Result Value Ref Range Status   MRSA by PCR NEGATIVE NEGATIVE Final    Comment:        The GeneXpert MRSA Assay (FDA approved for NASAL specimens only), is one component of a comprehensive MRSA colonization surveillance program. It is not intended to diagnose MRSA infection nor to guide or monitor treatment for MRSA infections. Performed at [facility], [place] [address] [place], [place] [address]       Studies: No results found.  Scheduled Meds: . allopurinol  100 mg Oral Daily  . amLODipine  10 mg Oral QHS  . aspirin  81 mg Oral Daily  . atorvastatin  40 mg Oral QPM  . budesonide (PULMICORT) nebulizer solution  0.5 mg Nebulization BID  . calcitRIOL  0.5 mcg Oral Once per day on Mon Wed Fri  . calcium acetate  2,001 mg Oral TID WC  . calcium carbonate  4 tablet Oral BID  . Chlorhexidine Gluconate Cloth  6 each Topical Q0600  . cholecalciferol  1,000 Units Oral Daily  . cinacalcet  150 mg Oral Q M,W,F  . darbepoetin (ARANESP) injection - DIALYSIS  150 mcg Intravenous Q Sat-HD  . gabapentin  100 mg Oral QHS  . heparin  5,000 Units Subcutaneous Q8H  . ipratropium  0.5 mg Nebulization BID  . isosorbide mononitrate  30 mg Oral Daily  . levalbuterol  1.25 mg Nebulization BID  . lisinopril  20 mg Oral QHS  . pantoprazole   40 mg Oral BID  . sevelamer carbonate  7.2 g Oral TID WC  . sodium chloride flush  3 mL Intravenous Q12H  . sodium chloride flush  3 mL Intravenous Q12H  . ticagrelor  90 mg Oral BID    Continuous Infusions: . sodium chloride    . sodium chloride    .  sodium chloride    . sodium chloride       LOS: 1 day     Desiree Hane, MD [facility] Pager [phone]  If 7PM-7AM, please contact night-coverage [email or web address] Password [email or web address] 02/12/2018, 3:11 PM

## 2018-02-12 NOTE — Progress Notes (Signed)
Pt back from HD 1 hr ago. Sts he is tired and doesn't want to walk right now. Discussed MI, stent, Brilinta, NTG, and CRPII with pt. Receptive but seems to rely on his daughter. She will need to be part of his education process at d/c. Will refer to [place] [facility] however he is unsure he will be able to do before HD in the am. 1300-1330

## 2018-02-12 NOTE — Progress Notes (Signed)
Transferred - in from 6c by bed awake and alert.

## 2018-02-13 DIAGNOSIS — I350 Nonrheumatic aortic (valve) stenosis: Secondary | ICD-10-CM

## 2018-02-13 DIAGNOSIS — I447 Left bundle-branch block, unspecified: Secondary | ICD-10-CM

## 2018-02-13 DIAGNOSIS — I214 Non-ST elevation (NSTEMI) myocardial infarction: Secondary | ICD-10-CM | POA: Diagnosis present

## 2018-02-13 LAB — GLUCOSE, CAPILLARY
Glucose-Capillary: 71 mg/dL (ref 70–99)
Glucose-Capillary: 152 mg/dL — ABNORMAL HIGH (ref 70–99)

## 2018-02-13 LAB — CBC
HCT: 30.5 % — ABNORMAL LOW (ref 39.0–52.0)
HEMOGLOBIN: 9.2 g/dL — AB (ref 13.0–17.0)
MCH: 25.8 pg — ABNORMAL LOW (ref 26.0–34.0)
MCHC: 30.2 g/dL (ref 30.0–36.0)
MCV: 85.4 fL (ref 80.0–100.0)
Platelets: 247 10*3/uL (ref 150–400)
RBC: 3.57 MIL/uL — ABNORMAL LOW (ref 4.22–5.81)
RDW: 18.1 % — ABNORMAL HIGH (ref 11.5–15.5)
WBC: 8.8 10*3/uL (ref 4.0–10.5)
nRBC: 0 % (ref 0.0–0.2)

## 2018-02-13 MED ORDER — PANTOPRAZOLE SODIUM 40 MG PO TBEC: 40.0000 mg | DELAYED_RELEASE_TABLET | Freq: Two times a day (BID) | ORAL | 0 refills | Status: DC

## 2018-02-13 MED ORDER — TICAGRELOR 90 MG PO TABS: 90.0000 mg | ORAL_TABLET | Freq: Two times a day (BID) | ORAL | 3 refills | Status: DC

## 2018-02-13 MED ORDER — ISOSORBIDE MONONITRATE ER 30 MG PO TB24: 30.0000 mg | ORAL_TABLET | Freq: Every day | ORAL | 0 refills | Status: DC

## 2018-02-13 MED ORDER — ASPIRIN 81 MG PO CHEW: 81.0000 mg | CHEWABLE_TABLET | Freq: Every day | ORAL | 2 refills | Status: AC

## 2018-02-13 NOTE — Plan of Care (Signed)
Discussed plan to be discharged today.

## 2018-02-13 NOTE — Plan of Care (Signed)
  Problem: Education: Goal: Knowledge of General Education information will improve Description: Including pain rating scale, medication(s)/side effects and non-pharmacologic comfort measures Outcome: Progressing   Problem: Health Behavior/Discharge Planning: Goal: Ability to manage health-related needs will improve Outcome: Progressing   Problem: Clinical Measurements: Goal: Ability to maintain clinical measurements within normal limits will improve Outcome: Progressing Goal: Will remain free from infection Outcome: Progressing Goal: Diagnostic test results will improve Outcome: Progressing Goal: Respiratory complications will improve Outcome: Progressing Goal: Cardiovascular complication will be avoided Outcome: Progressing   Problem: Coping: Goal: Level of anxiety will decrease Outcome: Progressing   Problem: Elimination: Goal: Will not experience complications related to bowel motility Outcome: Progressing Goal: Will not experience complications related to urinary retention Outcome: Progressing   Problem: Skin Integrity: Goal: Risk for impaired skin integrity will decrease Outcome: Progressing   

## 2018-02-13 NOTE — Care Management (Signed)
Discussed new Brilinta prescription with patient.  Pt given 30 day free and copay cards.  Advised patient to find out copay today at pharmacy and d/w MD if unable to afford next month.  Pt states his daughter will pick him up today and use 30 day free card to fill prescription this afternoon.  No further CM needs identified.

## 2018-02-13 NOTE — Discharge Summary (Addendum)
Discharge Summary  [name] ERX:[id number] DOB: February 28, 1934  PCP: Seward Carol, MD  Admit date: 02/10/2018 Discharge date: 02/13/2018   Time spent: > 35 minutes  Admitted From: Home Disposition: Home  Recommendations for Outpatient Follow-up:  1. Follow up with PCP in 1 week 2. Follow up with cardiology 2-4 weeks, cardiology will arrange outpatient appointment 3. New medications: Aspirin, Brilinta, Imdur      Discharge Diagnoses:  Active Hospital Problems   Diagnosis Date Noted  . Chest pain 12/28/2014  . Left bundle branch block (LBBB) determined by electrocardiography 02/13/2018  . Aortic stenosis 02/13/2018  . NSTEMI (non-ST elevated myocardial infarction) ([facility]an Best) 02/13/2018  . Gout 12/06/2017  . Elevated troponin 04/17/2017  . ESRD on hemodialysis ([place]) 04/12/2016  . H/O: stroke 04/11/2016  . Anemia 03/11/2013  . DM (diabetes mellitus), type 2 with renal complications, diet controlled 03/11/2013  . Hyperlipidemia 03/11/2013  . HTN (hypertension) 03/11/2013    Resolved Hospital Problems  No resolved problems to display.    Discharge Condition: Stable    CODE STATUS:FULL    History of present illness:  Sean Best is a 84 y.o. year old male with medical history significant for anemia, , PAD, ESRD on hemodialysis MWF, hyperlipidemia, hypertension, pacemaker placement, history of prostate CA, history of herpes zoster, history of CVA, type 2 diabetes, umbilical hernia who presented on 02/10/2018 with burning chest pain with associated cough and dyspnea x1 day.  Of note patient was recently treated at urgent care approximately a week ago for presumed viral URI.  Additionally patient had missed HD the day prior due to a family emergency.    On arrival to ED, he was found to have vascular congestion on chest x-ray with small bilateral pleural effusions and elevated troponin 0.11 with peak of 0.13.  Given persistent chest pain despite volume removal with HD there was   concern for NSTEMI. Remaining hospital course addressed in problem based format below:   Hospital Course:   Flash pulmonary edema/vascular congestion.  Likely multifactorial etiology related to missed HD session as outpatient and NSTEMI.  On presentation patient was severely dyspneic and required scheduled duo nebs in addition to consecutive HD sessions with great removal of volume and improvement in respiratory symptoms.  He was treated for viral URI at urgent care center a week prior to admission but had no localizing signs or symptoms of infection here and chest x-ray was negative for pneumonia.  ETA was also negative for PE on admission.  NSTEMI, status post DES stents x2 to left circumflex.  On admission with complaining of atypical chest pain typically with HD but did not improve despite removal volume.  Initial troponin was 0.11 with peak of 0.13, with new left bundle branch block on admission.  Given significant CAD risk factors (diabetes, hypertension, hyperlipidemia, ESRD) and persistent chest pain was evaluated with heart cath on 12/14 and found to have 75% stenosis of mid circumflex, 50% stenosis of proximal circumflex.  He was treated with 2 drug-eluting stents to the left circumflex/obtuse marginal branch.  He was loaded with ticagrelor and continued on aspirin 81 mg and ticagrelor 90 mg twice daily.  Should continue atorvastatin 40 mg.  Of note he did have normal systolic function on evaluation during cath.  He was monitored for 24 hours after cath and remained chest pain-free with no dyspnea and stable groin site lesion (access for cath).  Cardiology will arrange follow-up in next 2 to 4 weeks.  Hypertension.  Continue home lisinopril 20  mg and amlodipine 10 mg.  Tolerated addition of Imdur 30 mg during hospitalization.  May need some up titration with blood pressure range 138/76- 160/84 as outpatient, we held off on aggressively increasing new blood pressure medications as his blood pressure  tends to run low during HD/ultrafiltration.  ESRD on HD Monday Wednesday Friday.  Unfortunately missed a session of dialysis prior to admission due to family emergency.  As mentioned above vascular congestion and dyspnea alludes partly due to volume overload in the setting of missed dialysis.  With consecutive sessions was able to remove volume and patient tolerated well with no dyspnea prior to discharge.  Chronic normocytic anemia, secondary to anemia of CKD.  Hemoglobin remained stable at 9.3 (previous baseline of 12.2, 9.3 on admission and remained that throughout hospital stay even with addition of aspirin and Brilinta).  Moderate aortic stenosis.  Previously seen on echo in 04/2017.  Repeat echo here noted mild stenosis of aortic valve  New left bundle branch block.  No prior history.  With elevated troponin and chest pain this prompted ischemic evaluation as mentioned above.  EKG on 12/14 after heart cath shows persistent left bundle branch block to be made aware of.   Consultations:  Cardiology-Dr. Candee Furbish  Nephrology-Dr. Burnett Sheng  Procedures/Studies: Left heart cath 12/14.  Status post DES x2 to left circumflex/obtuse marginal branch   Ost RCA to Prox RCA lesion is 30% stenosed.  Dist RCA lesion is 30% stenosed.  Post Atrio lesion is 80% stenosed.  Ost 1st Mrg lesion is 70% stenosed.  1st Mrg lesion is 95% stenosed.  Prox Cx lesion is 50% stenosed.  Mid Cx to Dist Cx lesion is 75% stenosed.  A drug-eluting stent was successfully placed using a STENT ORSIRO 3.0X13.  Post intervention, there is a 0% residual stenosis.  A drug-eluting stent was successfully placed using a STENT ORSIRO 3.5X13.  Post intervention, there is a 0% residual stenosis.   1.  Severe single-vessel coronary artery disease involving the left circumflex/obtuse marginal branch, treated successfully with 2 drug-eluting stents 2.  Mild nonobstructive stenosis of the RCA and LAD 3.  Normal LV  systolic function and mild to moderate aortic stenosis by noninvasive assessment  Recommendations: Dual antiplatelet therapy with aspirin and ticagrelor for 12 months without interruption.  Ticagrelor 180 mg is administered in the cardiac Cath Lab.  Discharge Exam: BP (!) 160/84 (BP Location: Right Arm)   Pulse 96   Temp 98 F (36.7 C) (Oral)   Resp 18   Ht 5\' 5"  (1.651 m)   Wt 87.6 kg   SpO2 98%   BMI 32.14 kg/m   General: Lying in bed, no apparent distress Eyes: EOMI, anicteric ENT: Oral Mucosa clear and moist Cardiovascular: regular rate and rhythm, systolic murmur , rubs or gallops, no edema, Respiratory: Normal respiratory effort on room air, lungs clear to auscultation bilaterally Abdomen: soft, non-distended, non-tender, normal bowel sounds Skin: Slight bruising in right groin with Band-Aidin place at femoral site is clean dry and intact Neurologic: Grossly no focal neuro deficit.Mental status AAOx3, speech normal, Psychiatric:Appropriate affect, and mood   Discharge Instructions You were cared for by a hospitalist during your hospital stay. If you have any questions about your discharge medications or the care you received while you were in the hospital after you are discharged, you can call the unit and asked to speak with the hospitalist on call if the hospitalist that took care of you is not available. Once you are discharged,  your primary care physician will handle any further medical issues. Please note that NO REFILLS for any discharge medications will be authorized once you are discharged, as it is imperative that you return to your primary care physician (or establish a relationship with a primary care physician if you do not have one) for your aftercare needs so that they can reassess your need for medications and monitor your lab values.  Discharge Instructions    Amb Referral to Cardiac Rehabilitation   Complete by:  As directed    Diagnosis:   Coronary  Stents NSTEMI PTCA     Diet - low sodium heart healthy   Complete by:  As directed    Increase activity slowly   Complete by:  As directed      Allergies as of 02/13/2018      Reactions   Cardura [doxazosin Mesylate] Other (See Comments)   Hallucinations      Medication List    STOP taking these medications   naproxen sodium 220 MG tablet Commonly known as:  ALEVE     TAKE these medications   allopurinol 100 MG tablet Commonly known as:  ZYLOPRIM Take 100 mg by mouth daily.   amLODipine 10 MG tablet Commonly known as:  NORVASC Take 10 mg by mouth at bedtime.   aspirin 81 MG chewable tablet Chew 1 tablet (81 mg total) by mouth daily. Start taking on:  February 14, 2018   atorvastatin 40 MG tablet Commonly known as:  LIPITOR Take 40 mg by mouth every evening.   calcium acetate 667 MG capsule Commonly known as:  PHOSLO Take 1,334-2,001 mg by mouth See admin instructions. Take 2001 mg by mouth 3 times daily with meals and take 1344 mg by mouth with snacks   calcium carbonate 750 MG chewable tablet Commonly known as:  TUMS EX Chew 4 tablets by mouth 2 (two) times daily.   gabapentin 100 MG capsule Commonly known as:  NEURONTIN Take 100 mg by mouth at bedtime.   isosorbide mononitrate 30 MG 24 hr tablet Commonly known as:  IMDUR Take 1 tablet (30 mg total) by mouth daily. Start taking on:  February 14, 2018   lidocaine-prilocaine cream Commonly known as:  EMLA Apply 1 application topically as needed (for port access).   lisinopril 20 MG tablet Commonly known as:  PRINIVIL,ZESTRIL Take 20 mg by mouth at bedtime.   multivitamin Tabs tablet Take 1 tablet by mouth daily.   pantoprazole 40 MG tablet Commonly known as:  PROTONIX Take 1 tablet (40 mg total) by mouth 2 (two) times daily.   polyethylene glycol packet Commonly known as:  MIRALAX Take 17 g by mouth daily.   sevelamer carbonate 2.4 g Pack Commonly known as:  RENVELA Take 7.2-9.6 g by mouth  3 (three) times daily with meals.   ticagrelor 90 MG Tabs tablet Commonly known as:  BRILINTA Take 1 tablet (90 mg total) by mouth 2 (two) times daily.   traMADol 50 MG tablet Commonly known as:  ULTRAM Take 1 tablet (50 mg total) by mouth every 6 (six) hours as needed for moderate pain or severe pain.   VELPHORO 500 MG chewable tablet Generic drug:  sucroferric oxyhydroxide Chew 500 mg by mouth 3 (three) times daily as needed (when not taking the phoslo).   Vitamin D-3 25 MCG (1000 UT) Caps Take 1,000 Units by mouth daily.      Allergies  Allergen Reactions  . Cardura [Doxazosin Mesylate] Other (See Comments)  Hallucinations      The results of significant diagnostics from this hospitalization (including imaging, microbiology, ancillary and laboratory) are listed below for reference.    Significant Diagnostic Studies: Ct Angio Chest Pe W/cm &/or Wo Cm  Result Date: 02/10/2018 CLINICAL DATA:  Chest pain, shortness of Breath EXAM: CT ANGIOGRAPHY CHEST WITH CONTRAST TECHNIQUE: Multidetector CT imaging of the chest was performed using the standard protocol during bolus administration of intravenous contrast. Multiplanar CT image reconstructions and MIPs were obtained to evaluate the vascular anatomy. CONTRAST:  100 mL Isovue 370 IV ISOVUE-370 IOPAMIDOL (ISOVUE-370) INJECTION 76% COMPARISON:  05/15/2016 FINDINGS: Cardiovascular: Pacer wires noted in the right heart. Diffuse coronary artery calcifications. Moderate aortic calcifications. No aneurysm. Heart is mildly enlarged. No filling defects in the pulmonary arteries to suggest pulmonary emboli. Mediastinum/Nodes: No mediastinal, hilar, or axillary adenopathy. Lungs/Pleura: Small bilateral effusions. Bilateral lower lobe airspace opacities with ground-glass opacities also noted in the inferior upper lobes. Findings likely reflect early edema and/or atelectasis. Upper Abdomen: Imaging into the upper abdomen shows no acute findings.  Musculoskeletal: Bilateral gynecomastia.  No acute bony abnormality. Review of the MIP images confirms the above findings. IMPRESSION: No evidence of pulmonary embolus. Cardiomegaly. Bilateral ground-glass opacities and lower lobe airspace opacities most likely edema. Some of the bibasilar opacities could reflect atelectasis. Small effusions. Diffuse coronary artery disease. Aortic Atherosclerosis (ICD10-I70.0). Electronically Signed   By: Rolm Baptise M.D.   On: 02/10/2018 11:13   Dg Chest Port 1 View  Result Date: 02/10/2018 CLINICAL DATA:  One-week history of cough and chest congestion and acute shortness of breath that began earlier today. Current history of diabetes, hypertension and end-stage renal disease on hemodialysis. EXAM: PORTABLE CHEST 1 VIEW COMPARISON:  12/06/2017 and earlier. FINDINGS: Cardiac silhouette moderately enlarged, unchanged. RIGHT subclavian dual lead transvenous pacemaker unchanged. Interval development of mild diffuse interstitial pulmonary edema. No confluent airspace consolidation. Possible small BILATERAL pleural effusions. IMPRESSION: Mild CHF, with stable moderate cardiomegaly and mild diffuse interstitial pulmonary edema. Possible small BILATERAL effusions. Electronically Signed   By: Evangeline Dakin M.D.   On: 02/10/2018 09:31    Microbiology: Recent Results (from the past 240 hour(s))  MRSA PCR Screening     Status: Abnormal   Collection Time: 02/11/18  9:06 PM  Result Value Ref Range Status   MRSA by PCR (A) NEGATIVE Final    INVALID, UNABLE TO DETERMINE THE PRESENCE OF TARGET DNA DUE TO SPECIMEN INTEGRITY. RECOLLECTION REQUESTED.    CommentDellie Burns RN [id number] 02/12/18 A BROWNING Performed at [facility] [address], [place], [place] [address]   MRSA PCR Screening     Status: None   Collection Time: 02/12/18 12:59 AM  Result Value Ref Range Status   MRSA by PCR NEGATIVE NEGATIVE Final    Comment:        The GeneXpert MRSA Assay (FDA approved  for NASAL specimens only), is one component of a comprehensive MRSA colonization surveillance program. It is not intended to diagnose MRSA infection nor to guide or monitor treatment for MRSA infections. Performed at [facility], [place] [address] [place], [place] [address]      Labs: Basic Metabolic Panel: Recent Labs  Lab 02/10/18 0909 02/10/18 0912 02/11/18 0522 02/12/18 0840  NA 140 138 140 137  K 4.7 4.6 4.2 4.1  CL 98 103 99 98  CO2 24  --  26 23  GLUCOSE 108* 104* 105* 100*  BUN 57* 52* 37* 49*  CREATININE  11.46* 12.30* 8.55* 10.14*  CALCIUM 9.0  --  8.8* 9.6  PHOS  --   --   --  6.2*   Liver Function Tests: Recent Labs  Lab 02/11/18 0522 02/12/18 0840  AST 18  --   ALT 15  --   ALKPHOS 40  --   BILITOT 0.6  --   PROT 6.1*  --   ALBUMIN 3.1* 3.3*   No results for input(s): LIPASE, AMYLASE in the last 168 hours. No results for input(s): AMMONIA in the last 168 hours. CBC: Recent Labs  Lab 02/10/18 0909 02/10/18 0912 02/11/18 0522 02/12/18 0840 02/13/18 0745  WBC 8.3  --  7.4 7.9 8.8  NEUTROABS 5.8  --   --   --   --   HGB 10.9* 12.2* 9.3* 9.5* 9.2*  HCT 37.7* 36.0* 31.6* 31.5* 30.5*  MCV 87.9  --  85.9 85.1 85.4  PLT 293  --  252 235 247   Cardiac Enzymes: Recent Labs  Lab 02/10/18 1512 02/10/18 2338 02/11/18 0522  TROPONINI 0.11* 0.10* 0.13*   BNP: BNP (last 3 results) Recent Labs    04/17/17 1800 09/01/17 2101 02/10/18 0909  BNP 2,812.2* 104.8* 922.6*    ProBNP (last 3 results) No results for input(s): PROBNP in the last 8760 hours.  CBG: Recent Labs  Lab 02/11/18 2120 02/12/18 0615 02/12/18 1601 02/12/18 2134 02/13/18 0744  GLUCAP 77 97 92 105* 71       Signed:  Desiree Hane, MD [facility] 02/13/2018, 9:55 AM

## 2018-02-13 NOTE — Progress Notes (Addendum)
Subjective:  No cos , tolerated HD yesterday ,noted plans foe dc   Objective Vital signs in last 24 hours: Vitals:   02/12/18 2309 02/13/18 0326 02/13/18 0746 02/13/18 0753  BP: (!) 152/98  (!) 160/84   Pulse: (!) 102  96   Resp: 20 18 18    Temp: 98.7 F (37.1 C) 98.6 F (37 C) 98 F (36.7 C)   TempSrc: Oral Oral Oral   SpO2: 97% 96% 100% 98%  Weight:  87.6 kg    Height:       Weight change: -3.2 kg  Physical Exam: General:Well developed,NAD , OX3 Appropriate  Neck. JVD not elevated. Lungs:CTA bilat  Heart:RRR,2/6 sem ,no rub or gallop. Abdomen: Soft, non-tender, non-distended, BS +. Extremities: Nopedaledema  Dialysis Access:L forearm AVF + thrill  Dialysis Orders: MWF at [facility], 450/A2.0, EDW 89.5kg, 2K/2Ca, AVF, heparin 6000 bolus - Mircera 175mcg IV q 2 weeks (last 01/19/18) - Sensipar 150mg  PO q HD - Calcitriol 0.69mcg PO q HD  Problem/Plan:: 1. CAD= cath 12/13=Severe single-vessel CAD involving the left circumflex/obtuse marginal branch, treated successfully with 2 drug-eluting stents,Mild nonobstructive stenosis of the RCA and LAD/Normal LV systolic function and mild to moderate aortic stenosis by noninvasive assessment 2. Dyspnea/Pulm edema:In setting of missed HD /CAD (new dx) With stenting  L Cx yest  ) wt loss recent viral illness = lower edw , hd 12/14  uf 3379 post wt 88.8  / pre 92. Fu wts bp after hd  3. ESRD:Usual MWF sched. Short HD12/12/ and 12/14 , back to usual MWF sched. tomor  Op unit  4. Hypertension/volume:BP ^ with volume excess,lower edw as above as able/ on Lisinopril 20 mg hs and amlodipine 10 mg hs Imdur 30 mg qd monitor bp on hd //,MAY need to taper down meds   If bp low on hd and not able to uf / NEW  EDW 87.5 kg  5. Anemia:Hgb 12.2on admit > 9.3 >9.5 >9.2  /given 150 Aranesp 12/14.  6. Metabolic bone disease:Ca ok, Phos 6.2. Continue home binders, will resume sensipar/VDRA with HD. 7. Type 2  DM 8. CAD: Trop 0.11 - up from baseline. ?Demand ischemia. Per primary. 9. Hx pacemaker   Ernest Haber, PA-C [place] [phone] 02/13/2018,9:58 AM  LOS: 2 days   Pt seen, examined and agree w A/P as above. For dc today. Lowering dry wt slightly, new coronary stents x 2 both in the LCX.   Kelly Splinter MD [facility] pager [phone]   02/13/2018, 2:48 PM    Labs: Basic Metabolic Panel: Recent Labs  Lab 02/10/18 0909 02/10/18 0912 02/11/18 0522 02/12/18 0840  NA 140 138 140 137  K 4.7 4.6 4.2 4.1  CL 98 103 99 98  CO2 24  --  26 23  GLUCOSE 108* 104* 105* 100*  BUN 57* 52* 37* 49*  CREATININE 11.46* 12.30* 8.55* 10.14*  CALCIUM 9.0  --  8.8* 9.6  PHOS  --   --   --  6.2*   Liver Function Tests: Recent Labs  Lab 02/11/18 0522 02/12/18 0840  AST 18  --   ALT 15  --   ALKPHOS 40  --   BILITOT 0.6  --   PROT 6.1*  --   ALBUMIN 3.1* 3.3*   No results for input(s): LIPASE, AMYLASE in the last 168 hours. No results for input(s): AMMONIA in the last 168 hours. CBC: Recent Labs  Lab 02/10/18 0909  02/11/18 0522  02/12/18 0840 02/13/18 0745  WBC 8.3  --  7.4 7.9 8.8  NEUTROABS 5.8  --   --   --   --   HGB 10.9*   < > 9.3* 9.5* 9.2*  HCT 37.7*   < > 31.6* 31.5* 30.5*  MCV 87.9  --  85.9 85.1 85.4  PLT 293  --  252 235 247   < > = values in this interval not displayed.   Cardiac Enzymes: Recent Labs  Lab 02/10/18 1512 02/10/18 2338 02/11/18 0522  TROPONINI 0.11* 0.10* 0.13*   CBG: Recent Labs  Lab 02/11/18 2120 02/12/18 0615 02/12/18 1601 02/12/18 2134 02/13/18 0744  GLUCAP 77 97 92 105* 71    Studies/Results: No results found. Medications: . sodium chloride    . sodium chloride    . sodium chloride    . sodium chloride     . allopurinol  100 mg Oral Daily  . amLODipine  10 mg Oral QHS  . aspirin  81 mg Oral Daily  . atorvastatin  40 mg Oral QPM  . budesonide (PULMICORT) nebulizer solution  0.5  mg Nebulization BID  . calcitRIOL  0.5 mcg Oral Once per day on Mon Wed Fri  . calcium acetate  2,001 mg Oral TID WC  . calcium carbonate  4 tablet Oral BID  . Chlorhexidine Gluconate Cloth  6 each Topical Q0600  . cholecalciferol  1,000 Units Oral Daily  . cinacalcet  150 mg Oral Q M,W,F  . darbepoetin (ARANESP) injection - DIALYSIS  150 mcg Intravenous Q Sat-HD  . gabapentin  100 mg Oral QHS  . heparin  5,000 Units Subcutaneous Q8H  . insulin aspart  0-9 Units Subcutaneous TID WC  . isosorbide mononitrate  30 mg Oral Daily  . levalbuterol  1.25 mg Nebulization BID  . lisinopril  20 mg Oral QHS  . pantoprazole  40 mg Oral BID  . sevelamer carbonate  7.2 g Oral TID WC  . sodium chloride flush  3 mL Intravenous Q12H  . sodium chloride flush  3 mL Intravenous Q12H  . ticagrelor  90 mg Oral BID

## 2018-02-13 NOTE — Progress Notes (Signed)
Pt and daughter provided with copy of discharge instructions. Verbalized understanding. Daughter reported she already picked up  His new meds. Belongings packed and sent home with patient. No c/o chest pain at time of discharge.  PIV removed and CMT notified.

## 2018-02-14 ENCOUNTER — Telehealth (HOSPITAL_COMMUNITY): Payer: Self-pay

## 2018-02-14 ENCOUNTER — Encounter (HOSPITAL_COMMUNITY): Payer: Self-pay | Admitting: Cardiovascular Disease

## 2018-02-14 DIAGNOSIS — I447 Left bundle-branch block, unspecified: Secondary | ICD-10-CM | POA: Insufficient documentation

## 2018-02-14 MED FILL — Nitroglycerin IV Soln 100 MCG/ML in D5W: INTRA_ARTERIAL | Qty: 10 | Status: AC

## 2018-02-14 NOTE — Telephone Encounter (Signed)
Called and spoke with pt daughter [name] who stated pt does not wish to join CR at this time. Pt does go to HD, M-W-F.  Closed referral

## 2018-02-15 LAB — POCT ACTIVATED CLOTTING TIME
Activated Clotting Time: 219 seconds
Activated Clotting Time: 246 seconds
Activated Clotting Time: 279 seconds

## 2018-02-20 ENCOUNTER — Telehealth: Payer: Medicare Other | Admitting: Physician Assistant

## 2018-02-20 DIAGNOSIS — M549 Dorsalgia, unspecified: Secondary | ICD-10-CM

## 2018-02-20 NOTE — Progress Notes (Signed)
Based on what you shared with me it looks like you have a serious condition that should be evaluated in a face to face office visit. Giving age, signifcant medical history, and severity of pain, you need to be seen in person for a detailed examination.  NOTE: If you entered your credit card information for this eVisit, you will not be charged. You may see a "hold" on your card for the $30 but that hold will drop off and you will not have a charge processed.  If you are having a true medical emergency please call 911.  If you need an urgent face to face visit, [facility] has four urgent care centers for your convenience.  If you need care fast and have a high deductible or no insurance consider:   [email or web address] to reserve your spot online an avoid wait times  [facility] [address], [address] [place], [place] [address] 8 am to 8 pm Monday-Friday 10 am to 4 pm Saturday-Sunday *Across the street from [employer]  [place], [address] 8 am to 5 pm Monday-Friday * In the [facility] on the [facility]   The following sites will take your  insurance:  . [facility] [place] a Provider at this Location  [address] [place], [place] [address] . 10 am to 8 pm Monday-Friday . 12 pm to 8 pm Saturday-Sunday   . [facility] [place] a Provider at this Location  [place], [place], [place] [address] . 8 am to 8 pm Monday-Friday . 9 am to 6 pm Saturday . 11 am to 6 pm Sunday   . [facility] at [place] Get Driving Directions  [address] [place], [place] [address] . 8 am to 8 pm Monday-Friday . 8 am to 4 pm Saturday-Sunday   Your e-visit answers were reviewed by a board certified advanced clinical practitioner  to complete your personal care plan.  Thank you for using e-Visits.

## 2018-02-22 ENCOUNTER — Emergency Department (HOSPITAL_COMMUNITY)
Admission: EM | Admit: 2018-02-22 | Discharge: 2018-02-22 | Disposition: A | Discharge status: Home / Self Care | Payer: Medicare Other | Attending: Emergency Medicine | Admitting: Emergency Medicine

## 2018-02-22 ENCOUNTER — Emergency Department (HOSPITAL_COMMUNITY): Payer: Medicare Other

## 2018-02-22 ENCOUNTER — Other Ambulatory Visit: Payer: Self-pay

## 2018-02-22 DIAGNOSIS — Z79899 Other long term (current) drug therapy: Secondary | ICD-10-CM | POA: Diagnosis not present

## 2018-02-22 DIAGNOSIS — R0602 Shortness of breath: Secondary | ICD-10-CM | POA: Insufficient documentation

## 2018-02-22 DIAGNOSIS — I120 Hypertensive chronic kidney disease with stage 5 chronic kidney disease or end stage renal disease: Secondary | ICD-10-CM | POA: Insufficient documentation

## 2018-02-22 DIAGNOSIS — R5381 Other malaise: Secondary | ICD-10-CM

## 2018-02-22 DIAGNOSIS — R05 Cough: Secondary | ICD-10-CM | POA: Diagnosis present

## 2018-02-22 DIAGNOSIS — N186 End stage renal disease: Secondary | ICD-10-CM | POA: Insufficient documentation

## 2018-02-22 DIAGNOSIS — Z992 Dependence on renal dialysis: Secondary | ICD-10-CM | POA: Diagnosis not present

## 2018-02-22 DIAGNOSIS — M25512 Pain in left shoulder: Secondary | ICD-10-CM | POA: Diagnosis not present

## 2018-02-22 DIAGNOSIS — R059 Cough, unspecified: Secondary | ICD-10-CM

## 2018-02-22 LAB — CBC
HEMATOCRIT: 33.2 % — AB (ref 39.0–52.0)
Hemoglobin: 9.8 g/dL — ABNORMAL LOW (ref 13.0–17.0)
MCH: 25.7 pg — ABNORMAL LOW (ref 26.0–34.0)
MCHC: 29.5 g/dL — ABNORMAL LOW (ref 30.0–36.0)
MCV: 87.1 fL (ref 80.0–100.0)
Platelets: 313 10*3/uL (ref 150–400)
RBC: 3.81 MIL/uL — ABNORMAL LOW (ref 4.22–5.81)
RDW: 18.5 % — AB (ref 11.5–15.5)
WBC: 8.3 10*3/uL (ref 4.0–10.5)
nRBC: 0 % (ref 0.0–0.2)

## 2018-02-22 LAB — LIPASE, BLOOD: Lipase: 43 U/L (ref 11–51)

## 2018-02-22 LAB — COMPREHENSIVE METABOLIC PANEL
ALT: 15 U/L (ref 0–44)
AST: 21 U/L (ref 15–41)
Albumin: 3.7 g/dL (ref 3.5–5.0)
Alkaline Phosphatase: 51 U/L (ref 38–126)
Anion gap: 14 (ref 5–15)
BUN: 15 mg/dL (ref 8–23)
CO2: 32 mmol/L (ref 22–32)
Calcium: 8.2 mg/dL — ABNORMAL LOW (ref 8.9–10.3)
Chloride: 92 mmol/L — ABNORMAL LOW (ref 98–111)
Creatinine, Ser: 4.82 mg/dL — ABNORMAL HIGH (ref 0.61–1.24)
GFR calc Af Amer: 12 mL/min — ABNORMAL LOW (ref >60)
GFR calc non Af Amer: 10 mL/min — ABNORMAL LOW (ref >60)
GLUCOSE: 92 mg/dL (ref 70–99)
Potassium: 3.5 mmol/L (ref 3.5–5.1)
Sodium: 138 mmol/L (ref 135–145)
Total Bilirubin: 0.6 mg/dL (ref 0.3–1.2)
Total Protein: 7.2 g/dL (ref 6.5–8.1)

## 2018-02-22 LAB — I-STAT TROPONIN, ED: Troponin i, poc: 0.06 ng/mL (ref 0.00–0.08)

## 2018-02-22 LAB — I-STAT CG4 LACTIC ACID, ED: Lactic Acid, Venous: 1.05 mmol/L (ref 0.5–1.9)

## 2018-02-22 LAB — BRAIN NATRIURETIC PEPTIDE: B Natriuretic Peptide: 266 pg/mL — ABNORMAL HIGH (ref 0.0–100.0)

## 2018-02-22 NOTE — ED Provider Notes (Signed)
[facility] Emergency Department Provider Note MRN:  [id number]  Arrival date & time: 02/22/18     Chief Complaint   Cough History of Present Illness   Sean Best is a 84 y.o. year-old male with a history of ESRD, stroke, diabetes presenting to the ED with chief complaint of cough.  Nearly 1 week of persistent cough, originally dry, now productive, thick white sputum.  Associated with general malaise.  Was at dialysis today, asked for the session to stop short at 3 hours instead of 4 hours because he did not feel well.  Was asked to be transported here for evaluation.  Denies fever, no headache or vision change, no chest pain or shortness of breath.  Has been experiencing left shoulder pain for the past several days, worse with motion.  Denies abdominal pain, no numbness or weakness to the arms or legs.  Had some bleeding from his AV fistula after being disconnected from dialysis, has since stopped bleeding.  Review of Systems  A complete 10 system review of systems was obtained and all systems are negative except as noted in the HPI and PMH.   Patient's Health History    Past Medical History:  Diagnosis Date  . Anemia   . Arthritis    "knees" (04/11/2016)  . Blind left eye    S/P trauma  . Chronic total occlusion of artery of the extremities (HCC)    pt not aware of this  . Claudication ([place])   . ESRD on dialysis [facility])    "MWF; [name]" ((04/10/2016)  . ESRD on peritoneal dialysis [facility])    Started dialysis around April 2015 per son.  Has been doing peritoneal dialysis at home.     . Gout   . Hyperlipidemia   . Hypertension   . Overweight(278.02)   . Presence of permanent cardiac pacemaker    medtronic  . Prostate cancer ([place]) 1990s  . Shingles   . Stroke [facility]) ~1998   denies residual on 04/11/2016  . Type II diabetes mellitus (HCC)    diet controlled  . Umbilical hernia 10/09/9831    Past Surgical History:  Procedure Laterality Date  . AV  FISTULA PLACEMENT, RADIOCEPHALIC  82/50/5397   Left arm  . CAPD INSERTION N/A 03/06/2013   Procedure: LAPAROSCOPIC INSERTION CONTINUOUS AMBULATORY PERITONEAL DIALYSIS  (CAPD) CATHETER;  Surgeon: Edward Jolly, MD;  Location: [place];  Service: General;  Laterality: N/A;  . CATARACT EXTRACTION Right   . CHOLECYSTECTOMY  12/02/2017   LAPROSCOPIC  . CORONARY STENT INTERVENTION N/A 02/11/2018   Procedure: CORONARY STENT INTERVENTION;  Surgeon: Sherren Mocha, MD;  Location: [place] CV LAB;  Service: Cardiovascular;  Laterality: N/A;  . EYE SURGERY Left 1970s   for eye injury  . HERNIA REPAIR    . INSERTION PROSTATE RADIATION SEED    . LAPAROSCOPIC CHOLECYSTECTOMY SINGLE SITE WITH INTRAOPERATIVE CHOLANGIOGRAM N/A 12/02/2017   Procedure: LAPAROSCOPIC CHOLECYSTECTOMY;  Surgeon: Elan Brainerd Boston, MD;  Location: [place];  Service: General;  Laterality: N/A;  . LEFT HEART CATH AND CORONARY ANGIOGRAPHY N/A 02/11/2018   Procedure: LEFT HEART CATH AND CORONARY ANGIOGRAPHY;  Surgeon: Sherren Mocha, MD;  Location: [place] CV LAB;  Service: Cardiovascular;  Laterality: N/A;  . PACEMAKER IMPLANT N/A 04/20/2017   Procedure: PACEMAKER IMPLANT;  Surgeon: Evans Lance, MD;  Location: [place] CV LAB;  Service: Cardiovascular;  Laterality: N/A;  . UMBILICAL HERNIA REPAIR N/A 03/06/2013   Procedure: HERNIA REPAIR UMBILICAL WITH MESH;  Surgeon: Edward Jolly, MD;  Location: [facility];  Service: General;  Laterality: N/A;    Family History  Problem Relation Age of Onset  . Cancer Mother   . Heart disease Mother   . Cancer Father     Social History   Socioeconomic History  . Marital status: Widowed    Spouse name: Not on file  . Number of children: Not on file  . Years of education: Not on file  . Highest education level: Not on file  Occupational History  . Occupation: Retired  Scientific laboratory technician  . Financial resource strain: Not on file  . Food insecurity:    Worry: Not on file    Inability:  Not on file  . Transportation needs:    Medical: Not on file    Non-medical: Not on file  Tobacco Use  . Smoking status: Never Smoker  . Smokeless tobacco: Never Used  Substance and Sexual Activity  . Alcohol use: No  . Drug use: No  . Sexual activity: Not Currently  Lifestyle  . Physical activity:    Days per week: Not on file    Minutes per session: Not on file  . Stress: Not on file  Relationships  . Social connections:    Talks on phone: Not on file    Gets together: Not on file    Attends religious service: Not on file    Active member of club or organization: Not on file    Attends meetings of clubs or organizations: Not on file    Relationship status: Not on file  . Intimate partner violence:    Fear of current or ex partner: Not on file    Emotionally abused: Not on file    Physically abused: Not on file    Forced sexual activity: Not on file  Other Topics Concern  . Not on file  Social History Narrative   Patient lives with his daughter.     Physical Exam  Vital Signs and Nursing Notes reviewed Vitals:   02/22/18 1514 02/22/18 1817  BP: (!) 155/86 (!) 165/99  Pulse: 92 93  Resp: 16 (!) 23  Temp: 97.9 F (36.6 C)   SpO2: 100% 100%    CONSTITUTIONAL: Well-appearing, NAD NEURO:  Alert and oriented x 3, no focal deficits EYES:  eyes equal and reactive ENT/NECK:  no LAD, no JVD CARDIO: Regular rate, well-perfused, normal S1 and S2 PULM:  CTAB no wheezing or rhonchi GI/GU:  normal bowel sounds, non-distended, non-tender MSK/SPINE:  No gross deformities, no edema SKIN:  no rash, atraumatic PSYCH:  Appropriate speech and behavior  Diagnostic and Interventional Summary    EKG Interpretation  Date/Time:  Tuesday February 22 2018 15:41:51 EST Ventricular Rate:  88 PR Interval:    QRS Duration: 108 QT Interval:  413 QTC Calculation: 500 R Axis:   -17 Text Interpretation:  Sinus rhythm Atrial premature complex Probable left atrial enlargement Borderline  left axis deviation Borderline prolonged QT interval Confirmed by Gerlene Fee [phone]) on 02/22/2018 4:28:41 PM      Labs Reviewed  CBC - Abnormal; Notable for the following components:      Result Value   RBC 3.81 (*)    Hemoglobin 9.8 (*)    HCT 33.2 (*)    MCH 25.7 (*)    MCHC 29.5 (*)    RDW 18.5 (*)    All other components within normal limits  COMPREHENSIVE METABOLIC PANEL - Abnormal; Notable for the following components:  Chloride 92 (*)    Creatinine, Ser 4.82 (*)    Calcium 8.2 (*)    GFR calc non Af Amer 10 (*)    GFR calc Af Amer 12 (*)    All other components within normal limits  BRAIN NATRIURETIC PEPTIDE - Abnormal; Notable for the following components:   B Natriuretic Peptide 266.0 (*)    All other components within normal limits  LIPASE, BLOOD  I-STAT TROPONIN, ED  I-STAT CG4 LACTIC ACID, ED    DG Chest 2 View  Final Result      Medications - No data to display   Procedures Critical Care  ED Course and Medical Decision Making  I have reviewed the triage vital signs and the nursing notes.  Pertinent labs & imaging results that were available during my care of the patient were reviewed by me and considered in my medical decision making (see below for details).  Persistent cough and malaise in this 84 year old male, will evaluate for pneumonia.  Patient is nontoxic, normal vital signs, nothing to suggest systemic infection.  Left shoulder pain favored to be noncardiac, will evaluate with EKG and troponin given patient's cardiovascular disease.  EKG unremarkable with no ischemic changes.  Troponin 0.06, within the range of normal given patient's ESRD.  Patient's left shoulder pain is consistently reproducible with range of motion of the shoulder.  Very low concern for cardiac etiology of this pain.  Patient is feeling much better.  Labs are reassuring, chest x-ray with no pneumonia.  We will follow-up with PCP.  After the discussed management above, the  patient was determined to be safe for discharge.  The patient was in agreement with this plan and all questions regarding their care were answered.  ED return precautions were discussed and the patient will return to the ED with any significant worsening of condition.  Barth Kirks. Sedonia Small, [place] [email or web address]  Final Clinical Impressions(s) / ED Diagnoses     ICD-10-CM   1. Acute pain of left shoulder M25.512   2. Cough R05 DG Chest 2 View    DG Chest 2 View  3. Malaise R53.81     ED Discharge Orders    None         Maudie Flakes, MD 02/22/18 [phone]

## 2018-02-22 NOTE — ED Notes (Signed)
Unable to get blood-work.  Pt difficult stick, RN 2x attempt.  Phlebotomy called.

## 2018-02-22 NOTE — ED Notes (Signed)
Patient transported to X-ray 

## 2018-02-22 NOTE — ED Triage Notes (Signed)
Per EMS: Pt from dialysis.  Pt received full treatment.  Pt c/o of cough 3/4 days.  Pt woke up this am with c/o of malaise, and pain in LUQ.  Pt recent hx of flu and cardiac stents.

## 2018-02-22 NOTE — ED Notes (Signed)
Bed: QI29 Expected date:  Expected time:  Means of arrival:  Comments: EMS 84yo cough

## 2018-02-22 NOTE — Discharge Instructions (Addendum)
You were evaluated in the Emergency Department and after careful evaluation, we did not find any emergent condition requiring admission or further testing in the hospital.  Your symptoms today seem to be due to a viral infection.  Please return to the Emergency Department if you experience any worsening of your condition.  We encourage you to follow up with a primary care provider.  Thank you for allowing [place] to be a part of your care.

## 2018-03-08 ENCOUNTER — Encounter: Payer: Self-pay | Admitting: Physician Assistant

## 2018-03-08 ENCOUNTER — Ambulatory Visit (INDEPENDENT_AMBULATORY_CARE_PROVIDER_SITE_OTHER): Payer: Medicare Other | Admitting: Physician Assistant

## 2018-03-08 VITALS — BP 160/88 | HR 101 | Ht 66.0 in | Wt 198.0 lb

## 2018-03-08 DIAGNOSIS — I5032 Chronic diastolic (congestive) heart failure: Secondary | ICD-10-CM

## 2018-03-08 DIAGNOSIS — I350 Nonrheumatic aortic (valve) stenosis: Secondary | ICD-10-CM

## 2018-03-08 DIAGNOSIS — N186 End stage renal disease: Secondary | ICD-10-CM

## 2018-03-08 DIAGNOSIS — Z950 Presence of cardiac pacemaker: Secondary | ICD-10-CM

## 2018-03-08 DIAGNOSIS — I10 Essential (primary) hypertension: Secondary | ICD-10-CM

## 2018-03-08 DIAGNOSIS — I25119 Atherosclerotic heart disease of native coronary artery with unspecified angina pectoris: Secondary | ICD-10-CM

## 2018-03-08 DIAGNOSIS — Z8673 Personal history of transient ischemic attack (TIA), and cerebral infarction without residual deficits: Secondary | ICD-10-CM

## 2018-03-08 DIAGNOSIS — Z992 Dependence on renal dialysis: Secondary | ICD-10-CM

## 2018-03-08 DIAGNOSIS — E119 Type 2 diabetes mellitus without complications: Secondary | ICD-10-CM

## 2018-03-08 DIAGNOSIS — R079 Chest pain, unspecified: Secondary | ICD-10-CM

## 2018-03-08 NOTE — Progress Notes (Signed)
Cardiology Office Note   Date:  03/09/2018   ID:  Sean Best, DOB [birth year], MRN [id number]  PCP:  Seward Carol, MD  Cardiologist:  Pixie Casino, MD EP: Cristopher Peru, MD  Dr. Patel/Dr. Melvia Heaps  Chief Complaint  Patient presents with  . Follow-up    seen for Dr. Debara Pickett.       History of Present Illness: Sean Best is a 85 y.o. male with PMH of who presents for cardiology visit.  He has a history of CHB s/p MDT PPM, ESRD on HD MWF, HTN, DM, chronic diastolic heart failure, prostate cancer, and history of CVA.  He was recently admitted to the hospital from 02/10/18-02/13/18 for the evaluation of chest pain and SOB. He was found to have pulmonary edema due to missed dialysis session, with improvement in SOB with HD. Chest pain persisted despite volume removal and troponins were elevated to peak 0.13. He was found ot have a new LBBB on EKG and decision was made to undergo a LHC which revealed severe single vessel CAD in the circumflex/OM branch managed with PCI/DES x2. Echo showed EF 60-65%, G2DD, no wall motion abnormalities, moderate LVH, and mild-moderate AS.   Since discharge, patient was seen in the ED on 02/22/2018 for cough, EKG did not show any acute changes.  Troponin was borderline elevated at 0.06.  Chest x-ray did not show any evidence of pneumonia, therefore patient was discharged.  Patient presents today for cardiology office visit.  His blood pressure is high and he continued to have a cough.  He says sometimes he coughes up white phlegm.  He denies any fever or chill.  His chest discomfort improved for about a week after discharge before coming back.  Since then, he has been having chest discomfort about 4 out of every 7 days.  He says his chest pain usually occur about 2 hours after going in for dialysis.  However his daughter mentions sometimes his chest pain can also occur also dialysis as well.  He is quite vague about his symptom.  I recommended to increase Imdur to  60 mg on nondialysis days and hold Imdur on dialysis days.  I also recommended a stress test to make sure he does not have any significant in-stent restenosis after the recent stent placement.  Otherwise he has been compliant with dual antiplatelet therapy.   Past Medical History:  Diagnosis Date  . Anemia   . Arthritis    "knees" (04/11/2016)  . Blind left eye    S/P trauma  . Chronic total occlusion of artery of the extremities (HCC)    pt not aware of this  . Claudication ([place])   . ESRD on dialysis [facility])    "MWF; [name]" ((04/10/2016)  . ESRD on peritoneal dialysis [facility])    Started dialysis around April 2015 per son.  Has been doing peritoneal dialysis at home.     . Gout   . Hyperlipidemia   . Hypertension   . Overweight(278.02)   . Presence of permanent cardiac pacemaker    medtronic  . Prostate cancer ([place]) 1990s  . Shingles   . Stroke [facility]) ~1998   denies residual on 04/11/2016  . Type II diabetes mellitus (HCC)    diet controlled  . Umbilical hernia 09/09/6267    Past Surgical History:  Procedure Laterality Date  . AV FISTULA PLACEMENT, RADIOCEPHALIC  48/54/6270   Left arm  . CAPD INSERTION N/A 03/06/2013   Procedure: LAPAROSCOPIC INSERTION CONTINUOUS AMBULATORY  PERITONEAL DIALYSIS  (CAPD) CATHETER;  Surgeon: Edward Jolly, MD;  Location: [place];  Service: General;  Laterality: N/A;  . CATARACT EXTRACTION Right   . CHOLECYSTECTOMY  12/02/2017   LAPROSCOPIC  . CORONARY STENT INTERVENTION N/A 02/11/2018   Procedure: CORONARY STENT INTERVENTION;  Surgeon: Sherren Mocha, MD;  Location: [place] CV LAB;  Service: Cardiovascular;  Laterality: N/A;  . EYE SURGERY Left 1970s   for eye injury  . HERNIA REPAIR    . INSERTION PROSTATE RADIATION SEED    . LAPAROSCOPIC CHOLECYSTECTOMY SINGLE SITE WITH INTRAOPERATIVE CHOLANGIOGRAM N/A 12/02/2017   Procedure: LAPAROSCOPIC CHOLECYSTECTOMY;  Surgeon: Michael Boston, MD;  Location: [place];  Service: General;  Laterality:  N/A;  . LEFT HEART CATH AND CORONARY ANGIOGRAPHY N/A 02/11/2018   Procedure: LEFT HEART CATH AND CORONARY ANGIOGRAPHY;  Surgeon: Sherren Mocha, MD;  Location: [place] CV LAB;  Service: Cardiovascular;  Laterality: N/A;  . PACEMAKER IMPLANT N/A 04/20/2017   Procedure: PACEMAKER IMPLANT;  Surgeon: Evans Lance, MD;  Location: [place] CV LAB;  Service: Cardiovascular;  Laterality: N/A;  . UMBILICAL HERNIA REPAIR N/A 03/06/2013   Procedure: HERNIA REPAIR UMBILICAL WITH MESH;  Surgeon: Edward Jolly, MD;  Location: [facility] OR;  Service: General;  Laterality: N/A;     Current Outpatient Medications  Medication Sig Dispense Refill  . allopurinol (ZYLOPRIM) 100 MG tablet Take 100 mg by mouth daily.      Marland Kitchen amLODipine (NORVASC) 10 MG tablet Take 10 mg by mouth at bedtime.   9  . aspirin 81 MG chewable tablet Chew 1 tablet (81 mg total) by mouth daily. 60 tablet 2  . atorvastatin (LIPITOR) 40 MG tablet Take 40 mg by mouth every evening.   3  . calcium acetate (PHOSLO) 667 MG capsule Take 1,334-2,001 mg by mouth See admin instructions. Take 2001 mg by mouth 3 times daily with meals and take 1344 mg by mouth with snacks    . Cholecalciferol (VITAMIN D-3) 1000 units CAPS Take 1,000 Units by mouth daily.    . Cinacalcet HCl (SENSIPAR PO) Take by mouth every Monday, Wednesday, and Friday.    . ferrous sulfate 325 (65 FE) MG EC tablet Take 325 mg by mouth 3 (three) times daily with meals.    . gabapentin (NEURONTIN) 100 MG capsule Take 100 mg by mouth at bedtime.  10  . isosorbide mononitrate (IMDUR) 30 MG 24 hr tablet Take 1 tablet (30 mg total) by mouth daily. 60 tablet 0  . lidocaine-prilocaine (EMLA) cream Apply 1 application topically as needed (for port access).   11  . lisinopril (PRINIVIL,ZESTRIL) 20 MG tablet Take 20 mg by mouth at bedtime.    . multivitamin (RENA-VIT) TABS tablet Take 1 tablet by mouth daily.    . pantoprazole (PROTONIX) 40 MG tablet Take 1 tablet (40 mg total) by mouth  2 (two) times daily. 60 tablet 0  . ticagrelor (BRILINTA) 90 MG TABS tablet Take 1 tablet (90 mg total) by mouth 2 (two) times daily. 60 tablet 3  . VELPHORO 500 MG chewable tablet Chew 500 mg by mouth 3 (three) times daily as needed (to break down phosphoru--swhen not taking the phoslo).   3   No current facility-administered medications for this visit.     Allergies:   Cardura [doxazosin mesylate]    Social History:  The patient  reports that he has never smoked. He has never used smokeless tobacco. He reports that he does not drink alcohol or  use drugs.   Family History:  The patient's family history includes Cancer in his father and mother; Heart disease in his mother.    ROS:  Please see the history of present illness.   Otherwise, review of systems are positive for chest pain.   All other systems are reviewed and negative.    PHYSICAL EXAM: VS:  BP (!) 160/88   Pulse (!) 101   Ht 5\' 6"  (1.676 m)   Wt 198 lb (89.8 kg)   BMI 31.96 kg/m  , BMI Body mass index is 31.96 kg/m. GEN: Well nourished, well developed, in no acute distress HEENT: normal Neck: no JVD, carotid bruits, or masses Cardiac: RRR; no rubs, or gallops,no edema   2/6 systolic murmur Respiratory:  clear to auscultation bilaterally, normal work of breathing GI: soft, nontender, nondistended, + BS MS: no deformity or atrophy Skin: warm and dry, no rash Neuro:  Strength and sensation are intact Psych: euthymic mood, full affect   EKG:  EKG is ordered today. The ekg ordered today demonstrates sinus tachycardia with left bundle branch block   Recent Labs: 04/17/2017: TSH 1.747 04/18/2017: Magnesium 2.5 02/22/2018: ALT 15; B Natriuretic Peptide 266.0; BUN 15; Creatinine, Ser 4.82; Hemoglobin 9.8; Platelets 313; Potassium 3.5; Sodium 138    Lipid Panel    Component Value Date/Time   CHOL 96 12/10/2013 2045   TRIG 190 (H) 12/10/2013 2045   HDL 22 (L) 12/10/2013 2045   CHOLHDL 4.4 12/10/2013 2045   VLDL 38  12/10/2013 2045   LDLCALC 36 12/10/2013 2045      Wt Readings from Last 3 Encounters:  03/08/18 198 lb (89.8 kg)  02/22/18 193 lb 2 oz (87.6 kg)  02/13/18 193 lb 2 oz (87.6 kg)      Other studies Reviewed: Additional studies/ records that were reviewed today include:   Left heart catheterization 02/11/18:  Ost RCA to Prox RCA lesion is 30% stenosed.  Dist RCA lesion is 30% stenosed.  Post Atrio lesion is 80% stenosed.  Ost 1st Mrg lesion is 70% stenosed.  1st Mrg lesion is 95% stenosed.  Prox Cx lesion is 50% stenosed.  Mid Cx to Dist Cx lesion is 75% stenosed.  A drug-eluting stent was successfully placed using a STENT ORSIRO 3.0X13.  Post intervention, there is a 0% residual stenosis.  A drug-eluting stent was successfully placed using a STENT ORSIRO 3.5X13.  Post intervention, there is a 0% residual stenosis.  1. Severe single-vessel coronary artery disease involving the left circumflex/obtuse marginal branch, treated successfully with 2 drug-eluting stents 2. Mild nonobstructive stenosis of the RCA and LAD 3. Normal LV systolic function and mild to moderate aortic stenosis by noninvasive assessment  Recommendations: Dual antiplatelet therapy with aspirin and ticagrelor for 12 months without interruption. Ticagrelor 180 mg is administered in the cardiac Cath Lab.  Echocardiogram 02/10/18: Study Conclusions  - Left ventricle: The cavity size was normal. Wall thickness was   increased in a pattern of moderate LVH. Systolic function was   normal. The estimated ejection fraction was in the range of 60%   to 65%. Wall motion was normal; there were no regional wall   motion abnormalities. Doppler parameters are consistent with   pseudonormal left ventricular relaxation (grade 2 diastolic   dysfunction). The E/e&' ratio is >15, suggesting elevated LV   filling pressure. - Aortic valve: Calcified with restricted leaflet motion. Mild   stenosis. There was  trivial regurgitation. Mean gradient (S): 13   mm Hg. Peak  gradient (S): 24 mm Hg. Valve area (VTI): 1.53 cm^2.   Valve area (Vmax): 1.42 cm^2. Valve area (Vmean): 1.54 cm^2. - Mitral valve: Calcified annulus. Mildly thickened leaflets .   There was mild regurgitation. - Left atrium: Severely dilated. - Systemic veins: The IVC measures <2.1 cm, but does not collapse   >50%, suggesting an elevated RA pressure of 8 mmHg. - Pericardium, extracardiac: A trivial pericardial effusion was   identified posterior to the heart.  Impressions:  - Compared to a prior study in 04/2017, the LVEF is slightly lower,   but normal at 60-65%. THere is mild to moderate aortic stenosis   with a lower mean gradient of 13 mmHg.   ASSESSMENT AND PLAN:  1.  Chest pain: According to patient, about a week after his recent discharge and stent placement, the chest pain recurred especially during dialysis.  He says his chest pain will start about 2 hours into the dialysis.  His daughter however mentions occasional chest pain on nondialysis days as well.  Patient is very vague about the chest discomfort on nondialysis days and the kept focusing on the chest discomfort that occurred during the dialysis.  It is unclear to me if he has in-stent restenosis in this case.  I discussed the case with DOD Dr. Oval Linsey, we recommend Lexiscan stress test to make sure there is no restenosis of the recent stent.  Otherwise patient has been compliant with aspirin and Brilinta  2.  CAD: Recent stent placement in the left circumflex territory x2, he has been compliant with aspirin and Brilinta  3. End-stage renal disease on hemodialysis: Followed by nephrology service, dialysis every Monday Wednesday and Friday  4. Hypertension: Blood pressure elevated today, increasing Imdur to 60 mg on nondialysis days  5. DM2: Managed by primary care provider  6. History of pacemaker: Followed by Dr. Lovena Le  7. Chronic diastolic heart failure:  Managed by nephrology service through dialysis 8. Aortic valve stenosis: Seen on recent echocardiogram appears to be moderate in nature.  Current medicines are reviewed at length with the patient today.  The patient does not have concerns regarding medicines.  The following changes have been made: Increase Imdur to 60 mg daily on nondialysis days  Labs/ tests ordered today include: Lexiscan stress test.  Orders Placed This Encounter  Procedures  . MYOCARDIAL PERFUSION IMAGING  . EKG 12-Lead     Disposition:   FU with Almyra Deforest PA-C in 2-3 weeks  Signed, Almyra Deforest, [place]  03/09/2018 9:45 AM

## 2018-03-08 NOTE — Patient Instructions (Addendum)
Medication Instructions:  Increase Isosorbide 60mg  on NON-dialysis days - Your physician recommends that you continue on your current medications as directed. Please refer to the Current Medication list given to you today. If you need a refill on your cardiac medications before your next appointment, please call your pharmacy. Labwork:  Take the provided lab slips with you to the lab for your blood draw.  When you have your labs (blood work) drawn today and your tests are completely normal, you will receive your results only by MyChart Message (if you have MyChart) -OR-  A paper copy in the mail.  If you have any lab test that is abnormal or we need to change your treatment, we will call you to review these results. Testing/Procedures: Your physician has requested that you have a lexiscan myoview. A cardiac stress test is a cardiological test that measures the heart's ability to respond to external stress in a controlled clinical environment. The stress response is induced by intravenous pharmacological stimulation. Make sure to hold all caffeine consumption for 12 hours prior to procedure.  Follow-Up: You will need a follow up appointment in 6 weeks.  Please call our office 2 months in advance to schedule this appointment.  You may see Pixie Casino, MD or one of the following Advanced Practice Providers on your designated Care Team:  Almyra Deforest, [place]  Fabian Sharp, PA-C   At [facility], you and your health needs are our priority.  As part of our continuing mission to provide you with exceptional heart care, we have created designated Provider Care Teams.  These Care Teams include your primary Cardiologist (physician) and Advanced Practice Providers (APPs -  Physician Assistants and Nurse Practitioners) who all work together to provide you with the care you need, when you need it.  Thank you for choosing [facility] at [facility]!!

## 2018-03-09 ENCOUNTER — Encounter: Payer: Self-pay | Admitting: Physician Assistant

## 2018-03-10 ENCOUNTER — Telehealth (HOSPITAL_COMMUNITY): Payer: Self-pay

## 2018-03-10 NOTE — Telephone Encounter (Signed)
Encounter complete. 

## 2018-03-14 ENCOUNTER — Encounter (INDEPENDENT_AMBULATORY_CARE_PROVIDER_SITE_OTHER): Payer: Self-pay | Admitting: Orthopaedic Surgery

## 2018-03-14 ENCOUNTER — Ambulatory Visit (INDEPENDENT_AMBULATORY_CARE_PROVIDER_SITE_OTHER): Payer: Medicare Other

## 2018-03-14 ENCOUNTER — Ambulatory Visit (INDEPENDENT_AMBULATORY_CARE_PROVIDER_SITE_OTHER): Payer: Medicare Other | Admitting: Orthopaedic Surgery

## 2018-03-14 VITALS — BP 150/89 | HR 79 | Ht 65.0 in | Wt 197.0 lb

## 2018-03-14 DIAGNOSIS — M25512 Pain in left shoulder: Secondary | ICD-10-CM

## 2018-03-14 MED ORDER — BUPIVACAINE HCL 0.5 % IJ SOLN
2.0000 mL | INTRAMUSCULAR | Status: AC | PRN
  Administered 2018-03-14: 2 mL via INTRA_ARTICULAR

## 2018-03-14 MED ORDER — METHYLPREDNISOLONE ACETATE 40 MG/ML IJ SUSP
80.0000 mg | INTRAMUSCULAR | Status: AC | PRN
  Administered 2018-03-14: 80 mg

## 2018-03-14 MED ORDER — LIDOCAINE HCL 2 % IJ SOLN
2.0000 mL | INTRAMUSCULAR | Status: AC | PRN
  Administered 2018-03-14: 2 mL

## 2018-03-14 NOTE — Progress Notes (Signed)
Office Visit Note   Patient: Sean Best           Date of Birth: 1933-07-11           MRN: [id number] Visit Date: 03/14/2018              Requested by: Seward Carol, MD [address] [place], [place] [address] PCP: Seward Carol, MD   Assessment & Plan: Visit Diagnoses:  1. Left shoulder pain, unspecified chronicity     Plan: Left shoulder pain with several possible etiologies including rotator cuff tear or some early arthritis.  Will inject subacromial space with cortisone and monitor response.  Consider MRI scan  Follow-Up Instructions: Return if symptoms worsen or fail to improve.   Orders:  Orders Placed This Encounter  Procedures  . Large Joint Inj: L subacromial bursa  . XR Shoulder Left  . Ambulatory referral to Physical Therapy   No orders of the defined types were placed in this encounter.     Procedures: Large Joint Inj: L subacromial bursa on 03/14/2018 11:21 AM Indications: pain and diagnostic evaluation Details: 25 G 1.5 in needle, anterolateral approach  Arthrogram: No  Medications: 2 mL lidocaine 2 %; 2 mL bupivacaine 0.5 %; 80 mg methylPREDNISolone acetate 40 MG/ML Consent was given by the patient. Immediately prior to procedure a time out was called to verify the correct patient, procedure, equipment, support staff and site/side marked as required. Patient was prepped and draped in the usual sterile fashion.       Clinical Data: No additional findings.   Subjective: Chief Complaint  Patient presents with  . Left Shoulder - Pain  . Follow-up    Pt stated lt shoulder--no injury--aching worse with the movement for about 1 month.  Sean Best is 85 years old man accompanied by family member and seen for evaluation of left shoulder pain.  He noted insidious onset over the last 6 weeks or so.  Had some difficulty with motion and particularly overhead maneuvering.  Has not had any numbness or tingling.  Is on dialysis related to his  hypertension and access is in the left forearm.  No fever or chills.  Good grip and release.  No evidence of  redness or ecchymosis.  Denies neck pain  HPI  Review of Systems  Constitutional: Negative.   HENT: Positive for sinus pressure.   Eyes: Negative.   Respiratory: Positive for shortness of breath.   Cardiovascular: Negative.   Gastrointestinal: Positive for diarrhea.  Endocrine: Negative.   Genitourinary: Negative.   Musculoskeletal: Positive for gait problem, myalgias and neck stiffness.  Skin: Negative.   Allergic/Immunologic: Negative.   Neurological: Positive for dizziness and headaches.  Hematological: Negative.   Psychiatric/Behavioral: Negative.      Objective: Vital Signs: BP (!) 150/89 (BP Location: Right Arm, Patient Position: Sitting, Cuff Size: Normal)   Pulse 79   Ht 5\' 5"  (1.651 m)   Wt 197 lb (89.4 kg)   BMI 32.78 kg/m   Physical Exam Constitutional:      Appearance: He is well-developed.  Eyes:     Pupils: Pupils are equal, round, and reactive to light.  Pulmonary:     Effort: Pulmonary effort is normal.  Skin:    General: Skin is warm and dry.  Neurological:     Mental Status: He is alert and oriented to person, place, and time.  Psychiatric:        Behavior: Behavior normal.     Ortho  Exam awake alert and oriented x3.  Comfortable sitting.  Positive impingement testing with crepitation noted in the anterior subacromial region.  Skin intact.  Biceps intact.  Able to place his arm over his head with some discomfort.  No loss of motion.  Mild weakness with external rotation.  Dialysis shunt in right forearm with some swelling in the wrist and the hand but no evidence of any infection  Specialty Comments:  No specialty comments available.  Imaging: Xr Shoulder Left  Result Date: 03/14/2018 Films of the left shoulder obtained in several projections.  The humeral head is centered about the glenoid.  No ectopic calcification.  No evidence of  glenohumeral arthritis.  Normal space between the humeral head and the acromium.  Degenerative changes are noted at the chromic clavicular joint with scalloping.  Type I acromium    PMFS History: Patient Active Problem List   Diagnosis Date Noted  . Left bundle branch block (LBBB) determined by electrocardiography 02/13/2018  . Aortic stenosis 02/13/2018  . NSTEMI (non-ST elevated myocardial infarction) ([place]) 02/13/2018  . Constipation 12/08/2017  . Abdominal pain 12/06/2017  . Leukocytosis 12/06/2017  . Gout 12/06/2017  . Chronic pain 12/06/2017  . Acute on chronic cholecystitis s/p lap cholecystectomy 12/02/2017 12/02/2017  . Cardiac pacemaker in situ (Medtronic DDD) 12/02/2017  . Acute hypoxemic respiratory failure ([place]) 04/18/2017  . Heart block 04/17/2017  . Elevated troponin 04/17/2017  . ESRD on hemodialysis ([place]) 04/12/2016  . H/O: stroke 04/11/2016  . Saccular aneurysm of infrarenal aorta with thrombosed dissection of abdominal aorta 04/11/2016  . Obesity (BMI 30-39.9) 04/11/2016  . Syncope 04/11/2016  . Prostate CA ([place]) 12/28/2014  . Chest pain 12/28/2014  . Hyperkalemia 03/11/2013  . Anemia 03/11/2013  . DM (diabetes mellitus), type 2 with renal complications, diet controlled 03/11/2013  . HTN (hypertension) 03/11/2013  . Hyperlipidemia 03/11/2013  . ESRD on dialysis [facility]) 03/20/2011   Past Medical History:  Diagnosis Date  . Anemia   . Arthritis    "knees" (04/11/2016)  . Blind left eye    S/P trauma  . Chronic total occlusion of artery of the extremities (HCC)    pt not aware of this  . Claudication ([place])   . ESRD on dialysis [facility])    "MWF; [name]" ((04/10/2016)  . ESRD on peritoneal dialysis [facility])    Started dialysis around April 2015 per son.  Has been doing peritoneal dialysis at home.     . Gout   . Hyperlipidemia   . Hypertension   . Overweight(278.02)   . Presence of permanent cardiac pacemaker    medtronic  . Prostate cancer ([place]) 1990s  .  Shingles   . Stroke [facility]) ~1998   denies residual on 04/11/2016  . Type II diabetes mellitus (HCC)    diet controlled  . Umbilical hernia 3/71/6967    Family History  Problem Relation Age of Onset  . Cancer Mother   . Heart disease Mother   . Cancer Father     Past Surgical History:  Procedure Laterality Date  . AV FISTULA PLACEMENT, RADIOCEPHALIC  89/38/1017   Left arm  . CAPD INSERTION N/A 03/06/2013   Procedure: LAPAROSCOPIC INSERTION CONTINUOUS AMBULATORY PERITONEAL DIALYSIS  (CAPD) CATHETER;  Surgeon: Edward Jolly, MD;  Location: [place];  Service: General;  Laterality: N/A;  . CATARACT EXTRACTION Right   . CHOLECYSTECTOMY  12/02/2017   LAPROSCOPIC  . CORONARY STENT INTERVENTION N/A 02/11/2018   Procedure: CORONARY STENT INTERVENTION;  Surgeon: Burt Knack,  Legrand Como, MD;  Location: [place] CV LAB;  Service: Cardiovascular;  Laterality: N/A;  . EYE SURGERY Left 1970s   for eye injury  . HERNIA REPAIR    . INSERTION PROSTATE RADIATION SEED    . LAPAROSCOPIC CHOLECYSTECTOMY SINGLE SITE WITH INTRAOPERATIVE CHOLANGIOGRAM N/A 12/02/2017   Procedure: LAPAROSCOPIC CHOLECYSTECTOMY;  Surgeon: Michael Boston, MD;  Location: [place];  Service: General;  Laterality: N/A;  . LEFT HEART CATH AND CORONARY ANGIOGRAPHY N/A 02/11/2018   Procedure: LEFT HEART CATH AND CORONARY ANGIOGRAPHY;  Surgeon: Sherren Mocha, MD;  Location: [place] CV LAB;  Service: Cardiovascular;  Laterality: N/A;  . PACEMAKER IMPLANT N/A 04/20/2017   Procedure: PACEMAKER IMPLANT;  Surgeon: Evans Lance, MD;  Location: [place] CV LAB;  Service: Cardiovascular;  Laterality: N/A;  . UMBILICAL HERNIA REPAIR N/A 03/06/2013   Procedure: HERNIA REPAIR UMBILICAL WITH MESH;  Surgeon: Edward Jolly, MD;  [facility];  Service: General;  Laterality: N/A;   Social History   Occupational History  . Occupation: Retired  Tobacco Use  . Smoking status: Never Smoker  . Smokeless tobacco: Never Used  Substance and  Sexual Activity  . Alcohol use: No  . Drug use: No  . Sexual activity: Not Currently

## 2018-03-15 ENCOUNTER — Ambulatory Visit (HOSPITAL_COMMUNITY)
Admission: RE | Admit: 2018-03-15 | Discharge: 2018-03-15 | Disposition: A | Discharge status: Home / Self Care | Payer: Medicare Other | Source: Ambulatory Visit | Attending: Cardiovascular Disease | Admitting: Cardiovascular Disease

## 2018-03-15 DIAGNOSIS — R079 Chest pain, unspecified: Secondary | ICD-10-CM | POA: Diagnosis present

## 2018-03-15 LAB — MYOCARDIAL PERFUSION IMAGING
CHL CUP NUCLEAR SDS: 2
LV dias vol: 169 mL (ref 62–150)
LV sys vol: 92 mL
Peak HR: 96 {beats}/min
Rest HR: 93 {beats}/min
SRS: 0
SSS: 2
TID: 1.02

## 2018-03-15 MED ORDER — REGADENOSON 0.4 MG/5ML IV SOLN
0.4000 mg | Freq: Once | INTRAVENOUS | Status: AC
  Administered 2018-03-15: 0.4 mg via INTRAVENOUS

## 2018-03-15 MED ORDER — TECHNETIUM TC 99M TETROFOSMIN IV KIT
30.4000 | PACK | Freq: Once | INTRAVENOUS | Status: AC | PRN
  Administered 2018-03-15: 30.4 via INTRAVENOUS
  Filled 2018-03-15: qty 31

## 2018-03-15 MED ORDER — TECHNETIUM TC 99M TETROFOSMIN IV KIT
10.3000 | PACK | Freq: Once | INTRAVENOUS | Status: AC | PRN
  Administered 2018-03-15: 10.3 via INTRAVENOUS
  Filled 2018-03-15: qty 11

## 2018-03-20 LAB — CUP PACEART REMOTE DEVICE CHECK
Battery Remaining Longevity: 122 mo
Battery Voltage: 3 V
Brady Statistic AP VP Percent: 0 %
Brady Statistic AP VS Percent: 0.01 %
Brady Statistic AS VP Percent: 0.07 %
Brady Statistic AS VS Percent: 99.92 %
Brady Statistic RA Percent Paced: 0.01 %
Brady Statistic RV Percent Paced: 0.07 %
Date Time Interrogation Session: 20191210044410
Implantable Lead Implant Date: 20190219
Implantable Lead Implant Date: 20190219
Implantable Lead Location: 753859
Implantable Lead Location: 753860
Implantable Lead Model: 3830
Implantable Lead Model: 5076
Implantable Pulse Generator Implant Date: 20190219
Lead Channel Impedance Value: 285 Ohm
Lead Channel Impedance Value: 304 Ohm
Lead Channel Impedance Value: 399 Ohm
Lead Channel Impedance Value: 418 Ohm
Lead Channel Pacing Threshold Amplitude: 1.125 V
Lead Channel Pacing Threshold Pulse Width: 0.4 ms
Lead Channel Sensing Intrinsic Amplitude: 4.625 mV
Lead Channel Sensing Intrinsic Amplitude: 4.625 mV
Lead Channel Sensing Intrinsic Amplitude: 7.5 mV
Lead Channel Sensing Intrinsic Amplitude: 7.5 mV
Lead Channel Setting Pacing Amplitude: 2 V
Lead Channel Setting Pacing Amplitude: 2.5 V
Lead Channel Setting Pacing Pulse Width: 1 ms
Lead Channel Setting Sensing Sensitivity: 1.2 mV
MDC IDC MSMT LEADCHNL RA PACING THRESHOLD AMPLITUDE: 0.625 V
MDC IDC MSMT LEADCHNL RV PACING THRESHOLD PULSEWIDTH: 0.4 ms

## 2018-03-23 ENCOUNTER — Telehealth: Payer: Self-pay

## 2018-03-23 NOTE — Telephone Encounter (Signed)
I don't remember ordering it, it may be one of the other providers.

## 2018-03-23 NOTE — Telephone Encounter (Signed)
I called patient to give him his test results and he stated that he was supposed to have a sleep study but I don't see anything in your note. Please advise.

## 2018-03-23 NOTE — Progress Notes (Signed)
[place] with me to use sublingual nitro as needed.

## 2018-03-24 ENCOUNTER — Other Ambulatory Visit: Payer: Self-pay

## 2018-03-24 MED ORDER — NITROGLYCERIN 0.4 MG SL SUBL: 0.4000 mg | SUBLINGUAL_TABLET | SUBLINGUAL | 3 refills | Status: DC | PRN

## 2018-03-24 NOTE — Telephone Encounter (Signed)
Spoke with patients daughter and informed her that a rx for Nitrostat has been sent to the pharmacy. I also asked her about the sleep study her dad asked me about and  she stated the patients pcp was handling the sleep study.

## 2018-03-24 NOTE — Telephone Encounter (Signed)
Left message for patients daughter's to contact office.

## 2018-04-20 ENCOUNTER — Encounter (HOSPITAL_BASED_OUTPATIENT_CLINIC_OR_DEPARTMENT_OTHER): Payer: Self-pay

## 2018-04-20 DIAGNOSIS — G4710 Hypersomnia, unspecified: Secondary | ICD-10-CM

## 2018-04-20 DIAGNOSIS — R0683 Snoring: Secondary | ICD-10-CM

## 2018-04-26 ENCOUNTER — Ambulatory Visit: Payer: Medicare Other | Admitting: Internal Medicine

## 2018-04-27 ENCOUNTER — Encounter (HOSPITAL_COMMUNITY): Payer: Self-pay | Admitting: Emergency Medicine

## 2018-04-27 ENCOUNTER — Encounter (HOSPITAL_COMMUNITY)
Admission: EM | Disposition: A | Discharge status: Home / Self Care | Payer: Self-pay | Source: Home / Self Care | Attending: Family Medicine

## 2018-04-27 ENCOUNTER — Emergency Department (HOSPITAL_COMMUNITY): Payer: Medicare Other

## 2018-04-27 ENCOUNTER — Other Ambulatory Visit: Payer: Self-pay

## 2018-04-27 ENCOUNTER — Inpatient Hospital Stay (HOSPITAL_COMMUNITY)
Admission: EM | Admit: 2018-04-27 | Discharge: 2018-04-30 | DRG: 312 | Disposition: A | Discharge status: Home / Self Care | Payer: Medicare Other | Attending: Family Medicine | Admitting: Family Medicine

## 2018-04-27 DIAGNOSIS — H5462 Unqualified visual loss, left eye, normal vision right eye: Secondary | ICD-10-CM | POA: Diagnosis present

## 2018-04-27 DIAGNOSIS — E1122 Type 2 diabetes mellitus with diabetic chronic kidney disease: Secondary | ICD-10-CM | POA: Diagnosis present

## 2018-04-27 DIAGNOSIS — E785 Hyperlipidemia, unspecified: Secondary | ICD-10-CM | POA: Diagnosis present

## 2018-04-27 DIAGNOSIS — Z79899 Other long term (current) drug therapy: Secondary | ICD-10-CM

## 2018-04-27 DIAGNOSIS — D631 Anemia in chronic kidney disease: Secondary | ICD-10-CM | POA: Diagnosis present

## 2018-04-27 DIAGNOSIS — N2581 Secondary hyperparathyroidism of renal origin: Secondary | ICD-10-CM | POA: Diagnosis present

## 2018-04-27 DIAGNOSIS — D721 Eosinophilia: Secondary | ICD-10-CM | POA: Diagnosis present

## 2018-04-27 DIAGNOSIS — I442 Atrioventricular block, complete: Secondary | ICD-10-CM | POA: Diagnosis present

## 2018-04-27 DIAGNOSIS — I352 Nonrheumatic aortic (valve) stenosis with insufficiency: Secondary | ICD-10-CM | POA: Diagnosis present

## 2018-04-27 DIAGNOSIS — R4689 Other symptoms and signs involving appearance and behavior: Secondary | ICD-10-CM | POA: Diagnosis not present

## 2018-04-27 DIAGNOSIS — Z8249 Family history of ischemic heart disease and other diseases of the circulatory system: Secondary | ICD-10-CM | POA: Diagnosis not present

## 2018-04-27 DIAGNOSIS — M109 Gout, unspecified: Secondary | ICD-10-CM | POA: Diagnosis present

## 2018-04-27 DIAGNOSIS — I471 Supraventricular tachycardia: Secondary | ICD-10-CM | POA: Diagnosis not present

## 2018-04-27 DIAGNOSIS — R55 Syncope and collapse: Secondary | ICD-10-CM | POA: Diagnosis present

## 2018-04-27 DIAGNOSIS — E1151 Type 2 diabetes mellitus with diabetic peripheral angiopathy without gangrene: Secondary | ICD-10-CM | POA: Diagnosis present

## 2018-04-27 DIAGNOSIS — Z888 Allergy status to other drugs, medicaments and biological substances status: Secondary | ICD-10-CM

## 2018-04-27 DIAGNOSIS — Z7902 Long term (current) use of antithrombotics/antiplatelets: Secondary | ICD-10-CM | POA: Diagnosis not present

## 2018-04-27 DIAGNOSIS — E8889 Other specified metabolic disorders: Secondary | ICD-10-CM | POA: Diagnosis present

## 2018-04-27 DIAGNOSIS — R4189 Other symptoms and signs involving cognitive functions and awareness: Secondary | ICD-10-CM

## 2018-04-27 DIAGNOSIS — E7800 Pure hypercholesterolemia, unspecified: Secondary | ICD-10-CM | POA: Diagnosis present

## 2018-04-27 DIAGNOSIS — E861 Hypovolemia: Secondary | ICD-10-CM | POA: Diagnosis present

## 2018-04-27 DIAGNOSIS — I2510 Atherosclerotic heart disease of native coronary artery without angina pectoris: Secondary | ICD-10-CM | POA: Diagnosis present

## 2018-04-27 DIAGNOSIS — Z8673 Personal history of transient ischemic attack (TIA), and cerebral infarction without residual deficits: Secondary | ICD-10-CM

## 2018-04-27 DIAGNOSIS — N186 End stage renal disease: Secondary | ICD-10-CM | POA: Diagnosis present

## 2018-04-27 DIAGNOSIS — I7092 Chronic total occlusion of artery of the extremities: Secondary | ICD-10-CM | POA: Diagnosis present

## 2018-04-27 DIAGNOSIS — Z7982 Long term (current) use of aspirin: Secondary | ICD-10-CM

## 2018-04-27 DIAGNOSIS — I495 Sick sinus syndrome: Secondary | ICD-10-CM | POA: Diagnosis present

## 2018-04-27 DIAGNOSIS — Z992 Dependence on renal dialysis: Secondary | ICD-10-CM

## 2018-04-27 DIAGNOSIS — I340 Nonrheumatic mitral (valve) insufficiency: Secondary | ICD-10-CM | POA: Diagnosis not present

## 2018-04-27 DIAGNOSIS — I959 Hypotension, unspecified: Secondary | ICD-10-CM | POA: Diagnosis present

## 2018-04-27 DIAGNOSIS — I447 Left bundle-branch block, unspecified: Secondary | ICD-10-CM | POA: Diagnosis present

## 2018-04-27 DIAGNOSIS — Z955 Presence of coronary angioplasty implant and graft: Secondary | ICD-10-CM

## 2018-04-27 DIAGNOSIS — Z91048 Other nonmedicinal substance allergy status: Secondary | ICD-10-CM

## 2018-04-27 DIAGNOSIS — I120 Hypertensive chronic kidney disease with stage 5 chronic kidney disease or end stage renal disease: Secondary | ICD-10-CM | POA: Diagnosis present

## 2018-04-27 DIAGNOSIS — Z9049 Acquired absence of other specified parts of digestive tract: Secondary | ICD-10-CM

## 2018-04-27 DIAGNOSIS — Z8546 Personal history of malignant neoplasm of prostate: Secondary | ICD-10-CM

## 2018-04-27 DIAGNOSIS — Z950 Presence of cardiac pacemaker: Secondary | ICD-10-CM

## 2018-04-27 LAB — COMPREHENSIVE METABOLIC PANEL
ALK PHOS: 48 U/L (ref 38–126)
ALT: 12 U/L (ref 0–44)
AST: 17 U/L (ref 15–41)
Albumin: 3.1 g/dL — ABNORMAL LOW (ref 3.5–5.0)
Anion gap: 17 — ABNORMAL HIGH (ref 5–15)
BUN: 50 mg/dL — ABNORMAL HIGH (ref 8–23)
CO2: 24 mmol/L (ref 22–32)
Calcium: 8.6 mg/dL — ABNORMAL LOW (ref 8.9–10.3)
Chloride: 100 mmol/L (ref 98–111)
Creatinine, Ser: 10.71 mg/dL — ABNORMAL HIGH (ref 0.61–1.24)
GFR calc Af Amer: 5 mL/min — ABNORMAL LOW (ref >60)
GFR, EST NON AFRICAN AMERICAN: 4 mL/min — AB (ref >60)
Glucose, Bld: 138 mg/dL — ABNORMAL HIGH (ref 70–99)
Potassium: 4.6 mmol/L (ref 3.5–5.1)
Sodium: 141 mmol/L (ref 135–145)
Total Bilirubin: 0.4 mg/dL (ref 0.3–1.2)
Total Protein: 5.7 g/dL — ABNORMAL LOW (ref 6.5–8.1)

## 2018-04-27 LAB — CBC
HCT: 31.1 % — ABNORMAL LOW (ref 39.0–52.0)
Hemoglobin: 8.7 g/dL — ABNORMAL LOW (ref 13.0–17.0)
MCH: 26.4 pg (ref 26.0–34.0)
MCHC: 28 g/dL — ABNORMAL LOW (ref 30.0–36.0)
MCV: 94.5 fL (ref 80.0–100.0)
Platelets: 232 10*3/uL (ref 150–400)
RBC: 3.29 MIL/uL — ABNORMAL LOW (ref 4.22–5.81)
RDW: 20 % — ABNORMAL HIGH (ref 11.5–15.5)
WBC: 11.6 10*3/uL — ABNORMAL HIGH (ref 4.0–10.5)
nRBC: 0 % (ref 0.0–0.2)

## 2018-04-27 LAB — CREATININE, SERUM
Creatinine, Ser: 11.61 mg/dL — ABNORMAL HIGH (ref 0.61–1.24)
GFR calc Af Amer: 4 mL/min — ABNORMAL LOW (ref >60)
GFR calc non Af Amer: 4 mL/min — ABNORMAL LOW (ref >60)

## 2018-04-27 LAB — I-STAT TROPONIN, ED: Troponin i, poc: 0.08 ng/mL (ref 0.00–0.08)

## 2018-04-27 LAB — GLUCOSE, CAPILLARY: GLUCOSE-CAPILLARY: 108 mg/dL — AB (ref 70–99)

## 2018-04-27 LAB — LACTIC ACID, PLASMA: Lactic Acid, Venous: 1.4 mmol/L (ref 0.5–1.9)

## 2018-04-27 LAB — MRSA PCR SCREENING: MRSA by PCR: NEGATIVE

## 2018-04-27 LAB — TROPONIN I: Troponin I: 0.15 ng/mL (ref <0.03)

## 2018-04-27 SURGERY — INVASIVE LAB ABORTED CASE

## 2018-04-27 MED ORDER — RENA-VITE PO TABS
1.0000 | ORAL_TABLET | Freq: Every day | ORAL | Status: DC
  Administered 2018-04-27 – 2018-04-29 (×3): 1 via ORAL
  Filled 2018-04-27 (×3): qty 1

## 2018-04-27 MED ORDER — SODIUM CHLORIDE 0.9% FLUSH: 3.0000 mL | INTRAVENOUS | Status: DC | PRN

## 2018-04-27 MED ORDER — PRO-STAT SUGAR FREE PO LIQD
30.0000 mL | Freq: Two times a day (BID) | ORAL | Status: DC
  Administered 2018-04-27 – 2018-04-30 (×6): 30 mL via ORAL
  Filled 2018-04-27 (×6): qty 30

## 2018-04-27 MED ORDER — FERROUS SULFATE 325 (65 FE) MG PO TABS: 325.0000 mg | ORAL_TABLET | Freq: Three times a day (TID) | ORAL | Status: DC

## 2018-04-27 MED ORDER — SUCROFERRIC OXYHYDROXIDE 500 MG PO CHEW
500.0000 mg | CHEWABLE_TABLET | Freq: Three times a day (TID) | ORAL | Status: DC | PRN
  Filled 2018-04-27: qty 1

## 2018-04-27 MED ORDER — LIDOCAINE-PRILOCAINE 2.5-2.5 % EX CREA: 1.0000 "application " | TOPICAL_CREAM | CUTANEOUS | Status: DC | PRN

## 2018-04-27 MED ORDER — PENTAFLUOROPROP-TETRAFLUOROETH EX AERO: 1.0000 "application " | INHALATION_SPRAY | CUTANEOUS | Status: DC | PRN

## 2018-04-27 MED ORDER — CALCIUM ACETATE (PHOS BINDER) 667 MG PO CAPS: 2001.0000 mg | ORAL_CAPSULE | Freq: Three times a day (TID) | ORAL | Status: DC

## 2018-04-27 MED ORDER — SODIUM CHLORIDE 0.9% FLUSH
3.0000 mL | Freq: Two times a day (BID) | INTRAVENOUS | Status: DC
  Administered 2018-04-29 – 2018-04-30 (×4): 3 mL via INTRAVENOUS

## 2018-04-27 MED ORDER — LIDOCAINE HCL (PF) 1 % IJ SOLN: 5.0000 mL | INTRAMUSCULAR | Status: DC | PRN

## 2018-04-27 MED ORDER — HEPARIN SODIUM (PORCINE) 1000 UNIT/ML DIALYSIS: 1000.0000 [IU] | INTRAMUSCULAR | Status: DC | PRN

## 2018-04-27 MED ORDER — ONDANSETRON HCL 4 MG PO TABS: 4.0000 mg | ORAL_TABLET | Freq: Four times a day (QID) | ORAL | Status: DC | PRN

## 2018-04-27 MED ORDER — SODIUM CHLORIDE 0.9 % IV SOLN: 250.0000 mL | INTRAVENOUS | Status: DC | PRN

## 2018-04-27 MED ORDER — SODIUM CHLORIDE 0.9 % IV BOLUS: 500.0000 mL | Freq: Once | INTRAVENOUS | Status: DC

## 2018-04-27 MED ORDER — HEPARIN SODIUM (PORCINE) 5000 UNIT/ML IJ SOLN
5000.0000 [IU] | Freq: Three times a day (TID) | INTRAMUSCULAR | Status: DC
  Administered 2018-04-28 – 2018-04-30 (×7): 5000 [IU] via SUBCUTANEOUS
  Filled 2018-04-27 (×8): qty 1

## 2018-04-27 MED ORDER — ALBUTEROL SULFATE (2.5 MG/3ML) 0.083% IN NEBU
2.5000 mg | INHALATION_SOLUTION | Freq: Three times a day (TID) | RESPIRATORY_TRACT | Status: DC
  Administered 2018-04-28: 2.5 mg via RESPIRATORY_TRACT
  Filled 2018-04-27 (×2): qty 3

## 2018-04-27 MED ORDER — TICAGRELOR 90 MG PO TABS
90.0000 mg | ORAL_TABLET | Freq: Two times a day (BID) | ORAL | Status: DC
  Administered 2018-04-27 – 2018-04-30 (×6): 90 mg via ORAL
  Filled 2018-04-27 (×6): qty 1

## 2018-04-27 MED ORDER — VITAMIN D 25 MCG (1000 UNIT) PO TABS: 1000.0000 [IU] | ORAL_TABLET | Freq: Every day | ORAL | Status: DC

## 2018-04-27 MED ORDER — ASPIRIN 81 MG PO CHEW
81.0000 mg | CHEWABLE_TABLET | Freq: Every day | ORAL | Status: DC
  Administered 2018-04-27 – 2018-04-30 (×4): 81 mg via ORAL
  Filled 2018-04-27 (×4): qty 1

## 2018-04-27 MED ORDER — ALBUTEROL SULFATE (2.5 MG/3ML) 0.083% IN NEBU
2.5000 mg | INHALATION_SOLUTION | Freq: Four times a day (QID) | RESPIRATORY_TRACT | Status: DC
  Administered 2018-04-27: 2.5 mg via RESPIRATORY_TRACT
  Filled 2018-04-27: qty 3

## 2018-04-27 MED ORDER — ALLOPURINOL 100 MG PO TABS
100.0000 mg | ORAL_TABLET | Freq: Every day | ORAL | Status: DC
  Administered 2018-04-28 – 2018-04-30 (×3): 100 mg via ORAL
  Filled 2018-04-27 (×3): qty 1

## 2018-04-27 MED ORDER — SODIUM CHLORIDE 0.9 % IV SOLN: 100.0000 mL | INTRAVENOUS | Status: DC | PRN

## 2018-04-27 MED ORDER — CINACALCET HCL 30 MG PO TABS
30.0000 mg | ORAL_TABLET | ORAL | Status: DC
  Administered 2018-04-29 – 2018-04-30 (×2): 30 mg via ORAL
  Filled 2018-04-27 (×2): qty 1

## 2018-04-27 MED ORDER — HEPARIN SODIUM (PORCINE) 1000 UNIT/ML DIALYSIS
5000.0000 [IU] | Freq: Once | INTRAMUSCULAR | Status: AC
  Administered 2018-04-28: 5000 [IU] via INTRAVENOUS_CENTRAL

## 2018-04-27 MED ORDER — ISOSORBIDE MONONITRATE ER 30 MG PO TB24
30.0000 mg | ORAL_TABLET | Freq: Every day | ORAL | Status: DC
  Administered 2018-04-28 – 2018-04-30 (×3): 30 mg via ORAL
  Filled 2018-04-27 (×3): qty 1

## 2018-04-27 MED ORDER — HEPARIN SODIUM (PORCINE) 5000 UNIT/ML IJ SOLN: 5000.0000 [IU] | Freq: Two times a day (BID) | INTRAMUSCULAR | Status: DC

## 2018-04-27 MED ORDER — ONDANSETRON HCL 4 MG/2ML IJ SOLN: 4.0000 mg | Freq: Four times a day (QID) | INTRAMUSCULAR | Status: DC | PRN

## 2018-04-27 MED ORDER — CHLORHEXIDINE GLUCONATE CLOTH 2 % EX PADS: 6.0000 | MEDICATED_PAD | Freq: Every day | CUTANEOUS | Status: DC

## 2018-04-27 MED ORDER — GABAPENTIN 100 MG PO CAPS
100.0000 mg | ORAL_CAPSULE | Freq: Every day | ORAL | Status: DC
  Administered 2018-04-27 – 2018-04-29 (×3): 100 mg via ORAL
  Filled 2018-04-27 (×3): qty 1

## 2018-04-27 MED ORDER — PANTOPRAZOLE SODIUM 40 MG PO TBEC
40.0000 mg | DELAYED_RELEASE_TABLET | Freq: Two times a day (BID) | ORAL | Status: DC
  Administered 2018-04-27 – 2018-04-30 (×6): 40 mg via ORAL
  Filled 2018-04-27 (×7): qty 1

## 2018-04-27 MED ORDER — ATORVASTATIN CALCIUM 40 MG PO TABS
40.0000 mg | ORAL_TABLET | Freq: Every evening | ORAL | Status: DC
  Administered 2018-04-27 – 2018-04-29 (×3): 40 mg via ORAL
  Filled 2018-04-27 (×3): qty 1

## 2018-04-27 SURGICAL SUPPLY — 8 items
GLIDESHEATH SLEND SS 6F .021 (SHEATH) IMPLANT
GUIDEWIRE INQWIRE 1.5J.035X260 (WIRE) IMPLANT
INQWIRE 1.5J .035X260CM (WIRE)
KIT ENCORE 26 ADVANTAGE (KITS) IMPLANT
KIT HEART LEFT (KITS) IMPLANT
PACK CARDIAC CATHETERIZATION (CUSTOM PROCEDURE TRAY) IMPLANT
TRANSDUCER W/STOPCOCK (MISCELLANEOUS) IMPLANT
TUBING CIL FLEX 10 FLL-RA (TUBING) IMPLANT

## 2018-04-27 NOTE — ED Notes (Signed)
ED TO INPATIENT HANDOFF REPORT  ED Nurse Name and Phone #: Donah Driver [phone]  S Name/Age/Gender Sean Best 85 y.o. male Room/Bed: TRACC/TRACC  Code Status   Code Status: Full Code  Home/SNF/Other Home Patient oriented to: self, place, time and situation Is this baseline? Yes   Triage Complete: Triage complete  Chief Complaint Code STEMI  Triage Note Pt to ED [facility] from dialysis center after having an episode of apnea and unresponsiveness. Was on machine for 15 minutes, became hypotensive, diaphoretic, and unresponsive. EMS arrived - pt had agonal resp., was assisted by BVM for 10 minutes. On arrival to ED pt had NPA in right nares- NRB O2  On at 10L/m/[place]. Pt is alert/oriented x 4, w/d--    Allergies Allergies  Allergen Reactions  . Cardura [Doxazosin Mesylate] Other (See Comments)    Hallucinations  . Tape     Paper tape    Level of Care/Admitting Diagnosis ED Disposition    ED Disposition Condition [facility]: [place] [100100]  Level of Care: Cardiac Telemetry [103]  Diagnosis: Syncope [206001]  Admitting Physician: Merton Border [[id number]]  Attending Physician: Laren Everts, [place]  Estimated length of stay: past midnight tomorrow  Certification:: I certify this patient will need inpatient services for at least 2 midnights  PT Class (Do Not Modify): Inpatient [101]  PT Acc Code (Do Not Modify): Private [1]       B Medical/Surgery History Past Medical History:  Diagnosis Date  . Anemia   . Arthritis    "knees" (04/11/2016)  . Blind left eye    S/P trauma  . Chronic total occlusion of artery of the extremities (HCC)    pt not aware of this  . Claudication ([place])   . ESRD on dialysis [facility])    "MWF; [name]" ((04/10/2016)  . ESRD on peritoneal dialysis [facility])    Started dialysis around April 2015 per son.  Has been doing peritoneal dialysis at home.     . Gout   . Hyperlipidemia   . Hypertension   .  Overweight(278.02)   . Presence of permanent cardiac pacemaker    medtronic  . Prostate cancer ([place]) 1990s  . Shingles   . Stroke [facility]) ~1998   denies residual on 04/11/2016  . Type II diabetes mellitus (HCC)    diet controlled  . Umbilical hernia 0/37/0488   Past Surgical History:  Procedure Laterality Date  . AV FISTULA PLACEMENT, RADIOCEPHALIC  89/16/9450   Left arm  . CAPD INSERTION N/A 03/06/2013   Procedure: LAPAROSCOPIC INSERTION CONTINUOUS AMBULATORY PERITONEAL DIALYSIS  (CAPD) CATHETER;  Surgeon: Edward Jolly, MD;  Location: [place];  Service: General;  Laterality: N/A;  . CATARACT EXTRACTION Right   . CHOLECYSTECTOMY  12/02/2017   LAPROSCOPIC  . CORONARY STENT INTERVENTION N/A 02/11/2018   Procedure: CORONARY STENT INTERVENTION;  Surgeon: Sherren Mocha, MD;  Location: [place] CV LAB;  Service: Cardiovascular;  Laterality: N/A;  . EYE SURGERY Left 1970s   for eye injury  . HERNIA REPAIR    . INSERTION PROSTATE RADIATION SEED    . LAPAROSCOPIC CHOLECYSTECTOMY SINGLE SITE WITH INTRAOPERATIVE CHOLANGIOGRAM N/A 12/02/2017   Procedure: LAPAROSCOPIC CHOLECYSTECTOMY;  Surgeon: Michael Boston, MD;  Location: [place];  Service: General;  Laterality: N/A;  . LEFT HEART CATH AND CORONARY ANGIOGRAPHY N/A 02/11/2018   Procedure: LEFT HEART CATH AND CORONARY ANGIOGRAPHY;  Surgeon: Sherren Mocha, MD;  Location: [place] [facility] LAB;  Service: Cardiovascular;  Laterality: N/A;  . PACEMAKER IMPLANT N/A 04/20/2017   Procedure: PACEMAKER IMPLANT;  Surgeon: Evans Lance, MD;  Location: [place] [facility] LAB;  Service: Cardiovascular;  Laterality: N/A;  . UMBILICAL HERNIA REPAIR N/A 03/06/2013   Procedure: HERNIA REPAIR UMBILICAL WITH MESH;  Surgeon: Edward Jolly, MD;  [facility];  Service: General;  Laterality: N/A;     A IV Location/Drains/Wounds Patient Lines/Drains/Airways Status   Active Line/Drains/Airways    Name:   Placement date:   Placement time:   Site:   Days:    Fistula / Graft Left Forearm Arteriovenous fistula   -    -    Forearm             Intake/Output Last 24 hours No intake or output data in the 24 hours ending 04/27/18 1930  Labs/Imaging Results for orders placed or performed during the hospital encounter of 04/27/18 (from the past 48 hour(s))  CBC     Status: Abnormal   Collection Time: 04/27/18  1:04 PM  Result Value Ref Range   WBC 11.6 (H) 4.0 - 10.5 K/uL   RBC 3.29 (L) 4.22 - 5.81 MIL/uL   Hemoglobin 8.7 (L) 13.0 - 17.0 g/dL   HCT 31.1 (L) 39.0 - 52.0 %   MCV 94.5 80.0 - 100.0 fL   MCH 26.4 26.0 - 34.0 pg   MCHC 28.0 (L) 30.0 - 36.0 g/dL   RDW 20.0 (H) 11.5 - 15.5 %   Platelets 232 150 - 400 K/uL   nRBC 0.0 0.0 - 0.2 %    Comment: Performed at [facility], [address], [place], [place] [address]  Comprehensive metabolic panel     Status: Abnormal   Collection Time: 04/27/18  1:04 PM  Result Value Ref Range   Sodium 141 135 - 145 mmol/L   Potassium 4.6 3.5 - 5.1 mmol/L   Chloride 100 98 - 111 mmol/L   CO2 24 22 - 32 mmol/L   Glucose, Bld 138 (H) 70 - 99 mg/dL   BUN 50 (H) 8 - 23 mg/dL   Creatinine, Ser 10.71 (H) 0.61 - 1.24 mg/dL   Calcium 8.6 (L) 8.9 - 10.3 mg/dL   Total Protein 5.7 (L) 6.5 - 8.1 g/dL   Albumin 3.1 (L) 3.5 - 5.0 g/dL   AST 17 15 - 41 U/L   ALT 12 0 - 44 U/L   Alkaline Phosphatase 48 38 - 126 U/L   Total Bilirubin 0.4 0.3 - 1.2 mg/dL   GFR calc non Af Amer 4 (L) >60 mL/min   GFR calc Af Amer 5 (L) >60 mL/min   Anion gap 17 (H) 5 - 15    Comment: Performed at [facility], [place] [address] [place], [place] [address]  I-stat troponin, ED     Status: None   Collection Time: 04/27/18  1:05 PM  Result Value Ref Range   Troponin i, poc 0.08 0.00 - 0.08 ng/mL   Comment 3            Comment: Due to the release kinetics of cTnI, a negative result within the first hours of the onset of symptoms does not rule out myocardial infarction with certainty. If myocardial infarction is still  suspected, repeat the test at appropriate intervals.   Lactic acid, plasma     Status: None   Collection Time: 04/27/18  5:04 PM  Result Value Ref Range   Lactic Acid, Venous 1.4 0.5 -  1.9 mmol/L    Comment: Performed at [facility], [place] [address] [place], [place] [address]   Dg Chest Port 1 View  Result Date: 04/27/2018 CLINICAL DATA:  Altered mental status, history of stroke EXAM: PORTABLE CHEST 1 VIEW COMPARISON:  02/10/2018 FINDINGS: There is no focal parenchymal opacity. There is no pleural effusion or pneumothorax. The heart and mediastinal contours are unremarkable. There is a dual lead cardiac pacemaker. The osseous structures are unremarkable. IMPRESSION: No active disease. Electronically Signed   By: Kathreen Devoid   On: 04/27/2018 13:45    Pending Labs Unresulted Labs (From admission, onward)    Start     Ordered   04/27/18 1839  Creatinine, serum  (heparin)  Once,   R    Comments:  Baseline for heparin therapy IF NOT ALREADY DRAWN.    04/27/18 1838   04/27/18 1839  Troponin I - Now Then Q6H  Now then every 6 hours,   R     04/27/18 1838   04/27/18 1305  Urinalysis, Routine w reflex microscopic  ONCE - STAT,   R     04/27/18 1304   Signed and Held  Renal function panel  Once,   R     Signed and Held   Signed and Held  CBC  Once,   R     Signed and Held          Vitals/Pain Today's Vitals   04/27/18 1715 04/27/18 1730 04/27/18 1815 04/27/18 1845  BP: (!) 154/105 (!) 167/105 (!) 159/100 (!) 145/88  Pulse: 92 91 100 94  Resp: (!) 0 19 17 (!) 24  SpO2: 99% 99% 100% 97%  PainSc:        Isolation Precautions No active isolations  Medications Medications  sodium chloride 0.9 % bolus 500 mL (0 mLs Intravenous Hold 04/27/18 1839)  allopurinol (ZYLOPRIM) tablet 100 mg (has no administration in time range)  aspirin chewable tablet 81 mg (has no administration in time range)  atorvastatin (LIPITOR) tablet 40 mg (has no administration in time range)   isosorbide mononitrate (IMDUR) 24 hr tablet 30 mg (has no administration in time range)  cinacalcet (SENSIPAR) tablet 30 mg (has no administration in time range)  sucroferric oxyhydroxide (VELPHORO) chewable tablet 500 mg (has no administration in time range)  pantoprazole (PROTONIX) EC tablet 40 mg (has no administration in time range)  calcium acetate (PHOSLO) capsule 1,334-2,001 mg (has no administration in time range)  ferrous sulfate EC tablet 325 mg (has no administration in time range)  ticagrelor (BRILINTA) tablet 90 mg (has no administration in time range)  gabapentin (NEURONTIN) capsule 100 mg (has no administration in time range)  Vitamin D-3 CAPS 1,000 Units (has no administration in time range)  multivitamin (RENA-VIT) tablet 1 tablet (has no administration in time range)  lidocaine-prilocaine (EMLA) cream 1 application (has no administration in time range)  heparin injection 5,000 Units (has no administration in time range)  sodium chloride flush (NS) 0.9 % injection 3 mL (has no administration in time range)  sodium chloride flush (NS) 0.9 % injection 3 mL (has no administration in time range)  0.9 %  sodium chloride infusion (has no administration in time range)  ondansetron (ZOFRAN) tablet 4 mg (has no administration in time range)    Or  ondansetron (ZOFRAN) injection 4 mg (has no administration in time range)  albuterol (PROVENTIL) (2.5 MG/3ML) 0.083% nebulizer solution 2.5 mg (has no administration in time range)  Chlorhexidine Gluconate  Cloth 2 % PADS 6 each (has no administration in time range)  feeding supplement (PRO-STAT SUGAR FREE 64) liquid 30 mL (has no administration in time range)    Mobility walks with person assist High fall risk   Focused Assessments Neuro Assessment Handoff:          Neuro Assessment:   Neuro Checks:      Last Documented NIHSS Modified Score:   Has TPA been given? No If patient is a Neuro Trauma and patient is going to OR  before floor call report to [place] nurse: [phone] or [phone]     R Recommendations: See Admitting Provider Note  Report given to:   Additional Notes:

## 2018-04-27 NOTE — ED Triage Notes (Signed)
Pt to ED [facility] from dialysis center after having an episode of apnea and unresponsiveness. Was on machine for 15 minutes, became hypotensive, diaphoretic, and unresponsive. EMS arrived - pt had agonal resp., was assisted by BVM for 10 minutes. On arrival to ED pt had NPA in right nares- NRB O2  On at 10L/m/. Pt is alert/oriented x 4, w/d--

## 2018-04-27 NOTE — ED Notes (Signed)
Code Stemi cancelled

## 2018-04-27 NOTE — Progress Notes (Addendum)
Pt admitted with syncope due to hypotension (SBP in the 70s) during outpatient hemodialysis today. Troponin = 0.15. Pt denies chest pain. Code STEMI cancelled in ED before admission. VS stable. MD paged. Pt for hemodialysis tonight. Will continue to monitor.

## 2018-04-27 NOTE — ED Notes (Signed)
Blue,light green,lavendar,1 gold and blood bank blood samples set to lab.

## 2018-04-27 NOTE — H&P (Addendum)
[facility]                                                                                    Patient Demographics  Sean Best, is a 85 y.o. male  CSN: [id number]  MRN: [id number]  DOB - [birth year]  Admit Date - 04/27/2018  Outpatient Primary MD for the patient is Seward Carol, MD   With History of -  Past Medical History:  Diagnosis Date  . Anemia   . Arthritis    "knees" (04/11/2016)  . Blind left eye    S/P trauma  . Chronic total occlusion of artery of the extremities (HCC)    pt not aware of this  . Claudication ([place])   . ESRD on dialysis [facility])    "MWF; [name]" ((04/10/2016)  . ESRD on peritoneal dialysis [facility])    Started dialysis around April 2015 per son.  Has been doing peritoneal dialysis at home.     . Gout   . Hyperlipidemia   . Hypertension   . Overweight(278.02)   . Presence of permanent cardiac pacemaker    medtronic  . Prostate cancer ([place]) 1990s  . Shingles   . Stroke [facility]) ~1998   denies residual on 04/11/2016  . Type II diabetes mellitus (HCC)    diet controlled  . Umbilical hernia 0/78/6754      Past Surgical History:  Procedure Laterality Date  . AV FISTULA PLACEMENT, RADIOCEPHALIC  49/20/1007   Left arm  . CAPD INSERTION N/A 03/06/2013   Procedure: LAPAROSCOPIC INSERTION CONTINUOUS AMBULATORY PERITONEAL DIALYSIS  (CAPD) CATHETER;  Surgeon: Edward Jolly, MD;  Location: [place];  Service: General;  Laterality: N/A;  . CATARACT EXTRACTION Right   . CHOLECYSTECTOMY  12/02/2017   LAPROSCOPIC  . CORONARY STENT INTERVENTION N/A 02/11/2018   Procedure: CORONARY STENT INTERVENTION;  Surgeon: Sherren Mocha, MD;  Location: [place] CV LAB;  Service: Cardiovascular;  Laterality: N/A;  . EYE SURGERY Left 1970s   for eye injury  . HERNIA REPAIR    . INSERTION PROSTATE RADIATION SEED    . LAPAROSCOPIC CHOLECYSTECTOMY SINGLE SITE WITH INTRAOPERATIVE CHOLANGIOGRAM N/A 12/02/2017   Procedure: LAPAROSCOPIC CHOLECYSTECTOMY;   Surgeon: Michael Boston, MD;  Location: [place];  Service: General;  Laterality: N/A;  . LEFT HEART CATH AND CORONARY ANGIOGRAPHY N/A 02/11/2018   Procedure: LEFT HEART CATH AND CORONARY ANGIOGRAPHY;  Surgeon: Sherren Mocha, MD;  Location: [place] CV LAB;  Service: Cardiovascular;  Laterality: N/A;  . PACEMAKER IMPLANT N/A 04/20/2017   Procedure: PACEMAKER IMPLANT;  Surgeon: Evans Lance, MD;  Location: [place] CV LAB;  Service: Cardiovascular;  Laterality: N/A;  . UMBILICAL HERNIA REPAIR N/A 03/06/2013   Procedure: HERNIA REPAIR UMBILICAL WITH MESH;  Surgeon: Edward Jolly, MD;  Location: [place];  Service: General;  Laterality: N/A;    in for   Chief Complaint  Patient presents with  . Code STEMI     HPI  Sean Best  is a 85 y.o. male, with past medical history significant for ESRD on hemodialysis who was 20 minutes into his scheduled hemodialysis when he had a presyncopal episode with hypotension.  Patient  received IV fluids normal saline in the ER and was sent to be evaluated for cardiac event because of EKG changes.  His systolic blood pressure was in the 70s and the patient had to be bagged in the dialysis center by EMS prior to bringing him to the ER.  Patient has chronic LBBB.  He remembers the whole event and denies any chest pains or palpitations.  Patient has a history of sick sinus syndrome status post pacemaker placement.  Patient denies any history of palpitations, nausea vomiting or cold sweats.  In the emergency room patient is back to his old self.  He is alert awake oriented and his blood pressure seems to be normal    Review of Systems    In addition to the HPI above,  No Fever-chills, No Headache, No changes with Vision or hearing, No problems swallowing food or Liquids, No Chest pain, Cough or Shortness of Breath, No Abdominal pain, No Nausea or Vommitting, Bowel movements are regular, No Blood in stool or Urine, No dysuria, No new skin rashes or  bruises, No new joints pains-aches,  No new weakness, tingling, numbness in any extremity, No recent weight gain or loss, No polyuria, polydypsia or polyphagia, No significant Mental Stressors.  A full 10 point Review of Systems was done, except as stated above, all other Review of Systems were negative.   Social History Social History   Tobacco Use  . Smoking status: Never Smoker  . Smokeless tobacco: Never Used  Substance Use Topics  . Alcohol use: No     Family History Family History  Problem Relation Age of Onset  . Cancer Mother   . Heart disease Mother   . Cancer Father      Prior to Admission medications   Medication Sig Start Date End Date Taking? Authorizing Provider  allopurinol (ZYLOPRIM) 100 MG tablet Take 100 mg by mouth daily.      [provider]  amLODipine (NORVASC) 10 MG tablet Take 10 mg by mouth at bedtime.  01/04/17   [provider]  aspirin 81 MG chewable tablet Chew 1 tablet (81 mg total) by mouth daily. 02/14/18   Desiree Hane, MD  atorvastatin (LIPITOR) 40 MG tablet Take 40 mg by mouth every evening.  07/07/17   [provider]  calcium acetate (PHOSLO) 667 MG capsule Take 1,334-2,001 mg by mouth See admin instructions. Take 2001 mg by mouth 3 times daily with meals and take 1344 mg by mouth with snacks    [provider]  Cholecalciferol (VITAMIN D-3) 1000 units CAPS Take 1,000 Units by mouth daily.    [provider]  Cinacalcet HCl (SENSIPAR PO) Take by mouth every Monday, Wednesday, and Friday.    [provider]  ferrous sulfate 325 (65 FE) MG EC tablet Take 325 mg by mouth 3 (three) times daily with meals.    [provider]  gabapentin (NEURONTIN) 100 MG capsule Take 100 mg by mouth at bedtime. 12/27/16   [provider]  isosorbide mononitrate (IMDUR) 30 MG 24 hr tablet Take 1 tablet (30 mg total) by mouth daily. 02/14/18   Desiree Hane, MD  lidocaine-prilocaine  (EMLA) cream Apply 1 application topically as needed (for port access).  10/28/17   [provider]  lisinopril (PRINIVIL,ZESTRIL) 20 MG tablet Take 20 mg by mouth at bedtime. 02/04/18   [provider]  multivitamin (RENA-VIT) TABS tablet Take 1 tablet by mouth daily.    [provider]  nitroGLYCERIN (NITROSTAT) 0.4 MG SL tablet Place 1 tablet (0.4 mg total) under the tongue every 5 (five) minutes as needed for up to 25 days for chest pain. 03/24/18 04/18/18  Almyra Deforest, PA  pantoprazole (PROTONIX) 40 MG tablet Take 1 tablet (40 mg total) by mouth 2 (two) times daily. 02/13/18 03/15/18  Oretha Milch D, MD  ticagrelor (BRILINTA) 90 MG TABS tablet Take 1 tablet (90 mg total) by mouth 2 (two) times daily. 02/13/18   Desiree Hane, MD  VELPHORO 500 MG chewable tablet Chew 500 mg by mouth 3 (three) times daily as needed (to break down phosphoru--swhen not taking the phoslo).  01/28/18   [provider]    Allergies  Allergen Reactions  . Cardura [Doxazosin Mesylate] Other (See Comments)    Hallucinations  . Tape     Paper tape    Physical Exam  Vitals  There were no vitals taken for this visit.   1. General elderly male, well-developed, well-nourished, extremely pleasant  2. Normal affect and insight, Not Suicidal or Homicidal, Awake Alert, Oriented X 3.  3. No F.N deficits, grossly, patient is moving all extremities.  4. Ears and Eyes appear Normal, Conjunctivae clear, PERRLA. Moist Oral Mucosa.  5. Supple Neck, No JVD, No cervical lymphadenopathy appriciated, No Carotid Bruits.  6. Symmetrical Chest wall movement, Good air movement bilaterally, CTAB.  7. RRR, No Gallops, Rubs or Murmurs, No Parasternal Heave.  8. Positive Bowel Sounds, Abdomen Soft, Non tender, No organomegaly appriciated,No rebound -guarding or rigidity.  9.  No Cyanosis, Normal Skin Turgor, No Skin Rash or Bruise.  10. Good muscle tone,  joints appear normal , no  effusions, Normal ROM.   Data Review  CBC Recent Labs  Lab 04/27/18 1304  WBC 11.6*  HGB 8.7*  HCT 31.1*  PLT 232  MCV 94.5  MCH 26.4  MCHC 28.0*  RDW 20.0*   ------------------------------------------------------------------------------------------------------------------  Chemistries  Recent Labs  Lab 04/27/18 1304  NA 141  K 4.6  CL 100  CO2 24  GLUCOSE 138*  BUN 50*  CREATININE 10.71*  CALCIUM 8.6*  AST 17  ALT 12  ALKPHOS 48  BILITOT 0.4   ------------------------------------------------------------------------------------------------------------------ CrCl cannot be calculated (Unknown ideal weight.). ------------------------------------------------------------------------------------------------------------------ No results for input(s): TSH, T4TOTAL, T3FREE, THYROIDAB in the last 72 hours.  Invalid input(s): FREET3   Coagulation profile No results for input(s): INR, PROTIME in the last 168 hours. ------------------------------------------------------------------------------------------------------------------- No results for input(s): DDIMER in the last 72 hours. -------------------------------------------------------------------------------------------------------------------  Cardiac Enzymes No results for input(s): CKMB, TROPONINI, MYOGLOBIN in the last 168 hours.  Invalid input(s): CK ------------------------------------------------------------------------------------------------------------------ Invalid input(s): POCBNP   ---------------------------------------------------------------------------------------------------------------  Urinalysis    Component Value Date/Time   COLORURINE YELLOW 12/29/2014 0550   APPEARANCEUR CLOUDY (A) 12/29/2014 0550   LABSPEC 1.020 12/29/2014 0550   PHURINE 5.0 12/29/2014 0550   GLUCOSEU NEGATIVE 12/29/2014 0550   HGBUR SMALL (A) 12/29/2014 0550   BILIRUBINUR NEGATIVE 12/29/2014 0550   KETONESUR  NEGATIVE 12/29/2014 0550   PROTEINUR 100 (A) 12/29/2014 0550   UROBILINOGEN 0.2 12/29/2014 0550   NITRITE NEGATIVE 12/29/2014 0550   LEUKOCYTESUR SMALL (A) 12/29/2014 0550    ----------------------------------------------------------------------------------------------------------------   Imaging results:   Dg Chest Port 1 View  Result Date: 04/27/2018 CLINICAL DATA:  Altered mental status, history of stroke EXAM: PORTABLE CHEST 1 VIEW COMPARISON:  02/10/2018 FINDINGS: There is no focal parenchymal opacity. There is no pleural effusion or pneumothorax. The heart and mediastinal contours are unremarkable. There is a  dual lead cardiac pacemaker. The osseous structures are unremarkable. IMPRESSION: No active disease. Electronically Signed   By: Kathreen Devoid   On: 04/27/2018 13:45    My personal review of EKG: Rhythm NSR, LBBB rate 95 beats per minutes  Assessment & Plan  Syncopal episode Patient has LBBB, chronic.  Need to rule out MI .  Check echocardiogram.  SSS Status post pacemaker placement  PVD Continue Brilinta/Lipitor  Hypertension We will hold lisinopril because episode of hypotension  Hypercholesterolemia Continue with Lipitor  ESRD Follow with nephrology for hemodialysis    DVT Prophylaxis Heparin  AM Labs Ordered, also please review Full Orders    Code Status full  Disposition Plan: Home  Time spent in minutes : 38 minutes  Condition GUARDED   @SIGNATURE @

## 2018-04-27 NOTE — ED Notes (Signed)
RN informed Pt can receive visitor  

## 2018-04-27 NOTE — ED Provider Notes (Signed)
[facility] Emergency Department Provider Note MRN:  [id number]  Arrival date & time: 04/27/18     Chief Complaint   Code STEMI   History of Present Illness   Sean Best is a 85 y.o. year-old male with a history of ESRD presenting to the ED with chief complaint of altered mental status.  Patient was pretty alerted as a code STEMI prior to arrival.  Had a syncopal episode while at dialysis.  Was only 20 minutes into his session.  Patient unsure if he had a warning that he was going to pass out.  Per EMS was agonal he breathing, unresponsive.  Reported blood pressures in the 21H systolic.  Review of Systems  A complete 10 system review of systems was obtained and all systems are negative except as noted in the HPI and PMH.   Patient's Health History    Past Medical History:  Diagnosis Date  . Anemia   . Arthritis    "knees" (04/11/2016)  . Blind left eye    S/P trauma  . Chronic total occlusion of artery of the extremities (HCC)    pt not aware of this  . Claudication ([place])   . ESRD on dialysis [facility])    "MWF; [name]" ((04/10/2016)  . ESRD on peritoneal dialysis [facility])    Started dialysis around April 2015 per son.  Has been doing peritoneal dialysis at home.     . Gout   . Hyperlipidemia   . Hypertension   . Overweight(278.02)   . Presence of permanent cardiac pacemaker    medtronic  . Prostate cancer ([place]) 1990s  . Shingles   . Stroke [facility]) ~1998   denies residual on 04/11/2016  . Type II diabetes mellitus (HCC)    diet controlled  . Umbilical hernia 0/86/5784    Past Surgical History:  Procedure Laterality Date  . AV FISTULA PLACEMENT, RADIOCEPHALIC  69/62/9528   Left arm  . CAPD INSERTION N/A 03/06/2013   Procedure: LAPAROSCOPIC INSERTION CONTINUOUS AMBULATORY PERITONEAL DIALYSIS  (CAPD) CATHETER;  Surgeon: Edward Jolly, MD;  Location: [place];  Service: General;  Laterality: N/A;  . CATARACT EXTRACTION Right   . CHOLECYSTECTOMY   12/02/2017   LAPROSCOPIC  . CORONARY STENT INTERVENTION N/A 02/11/2018   Procedure: CORONARY STENT INTERVENTION;  Surgeon: Sherren Mocha, MD;  Location: [place] CV LAB;  Service: Cardiovascular;  Laterality: N/A;  . EYE SURGERY Left 1970s   for eye injury  . HERNIA REPAIR    . INSERTION PROSTATE RADIATION SEED    . LAPAROSCOPIC CHOLECYSTECTOMY SINGLE SITE WITH INTRAOPERATIVE CHOLANGIOGRAM N/A 12/02/2017   Procedure: LAPAROSCOPIC CHOLECYSTECTOMY;  Surgeon: Guilherme Schwenke Boston, MD;  Location: [place];  Service: General;  Laterality: N/A;  . LEFT HEART CATH AND CORONARY ANGIOGRAPHY N/A 02/11/2018   Procedure: LEFT HEART CATH AND CORONARY ANGIOGRAPHY;  Surgeon: Sherren Mocha, MD;  Location: [place] CV LAB;  Service: Cardiovascular;  Laterality: N/A;  . PACEMAKER IMPLANT N/A 04/20/2017   Procedure: PACEMAKER IMPLANT;  Surgeon: Evans Lance, MD;  Location: [place] CV LAB;  Service: Cardiovascular;  Laterality: N/A;  . UMBILICAL HERNIA REPAIR N/A 03/06/2013   Procedure: HERNIA REPAIR UMBILICAL WITH MESH;  Surgeon: Edward Jolly, MD;  Location: [facility] OR;  Service: General;  Laterality: N/A;    Family History  Problem Relation Age of Onset  . Cancer Mother   . Heart disease Mother   . Cancer Father     Social History   Socioeconomic History  .  Marital status: Widowed    Spouse name: Not on file  . Number of children: Not on file  . Years of education: Not on file  . Highest education level: Not on file  Occupational History  . Occupation: Retired  Scientific laboratory technician  . Financial resource strain: Not on file  . Food insecurity:    Worry: Not on file    Inability: Not on file  . Transportation needs:    Medical: Not on file    Non-medical: Not on file  Tobacco Use  . Smoking status: Never Smoker  . Smokeless tobacco: Never Used  Substance and Sexual Activity  . Alcohol use: No  . Drug use: No  . Sexual activity: Not Currently  Lifestyle  . Physical activity:    Days per  week: Not on file    Minutes per session: Not on file  . Stress: Not on file  Relationships  . Social connections:    Talks on phone: Not on file    Gets together: Not on file    Attends religious service: Not on file    Active member of club or organization: Not on file    Attends meetings of clubs or organizations: Not on file    Relationship status: Not on file  . Intimate partner violence:    Fear of current or ex partner: Not on file    Emotionally abused: Not on file    Physically abused: Not on file    Forced sexual activity: Not on file  Other Topics Concern  . Not on file  Social History Narrative   Patient lives with his daughter.     Physical Exam  Vital Signs and Nursing Notes reviewed Vitals:   04/27/18 1500 04/27/18 1530  BP: (!) 166/108 (!) 166/107  Pulse: 92 82  Resp: 18 17  SpO2: 99% 97%    CONSTITUTIONAL: Chronically ill-appearing, NAD NEURO:  Alert and oriented x 3, no focal deficits EYES:  eyes equal and reactive ENT/NECK:  no LAD, no JVD CARDIO: Regular rate, well-perfused, normal S1 and S2 PULM:  CTAB no wheezing or rhonchi GI/GU:  normal bowel sounds, non-distended, non-tender MSK/SPINE:  No gross deformities, no edema SKIN:  no rash, atraumatic PSYCH:  Appropriate speech and behavior  Diagnostic and Interventional Summary    EKG Interpretation  Date/Time:  Wednesday April 27 2018 12:55:34 EST Ventricular Rate:  95 PR Interval:    QRS Duration: 152 QT Interval:  422 QTC Calculation: 531 R Axis:   168 Text Interpretation:  Sinus rhythm Left bundle branch block Confirmed by Gerlene Fee [id number]) on 04/27/2018 1:25:03 PM      Labs Reviewed  CBC - Abnormal; Notable for the following components:      Result Value   WBC 11.6 (*)    RBC 3.29 (*)    Hemoglobin 8.7 (*)    HCT 31.1 (*)    MCHC 28.0 (*)    RDW 20.0 (*)    All other components within normal limits  COMPREHENSIVE METABOLIC PANEL - Abnormal; Notable for the following  components:   Glucose, Bld 138 (*)    BUN 50 (*)    Creatinine, Ser 10.71 (*)    Calcium 8.6 (*)    Total Protein 5.7 (*)    Albumin 3.1 (*)    GFR calc non Af Amer 4 (*)    GFR calc Af Amer 5 (*)    Anion gap 17 (*)    All other components within  normal limits  LACTIC ACID, PLASMA  URINALYSIS, ROUTINE W REFLEX MICROSCOPIC  I-STAT TROPONIN, ED    DG Chest Port 1 View  Final Result      Medications  sodium chloride 0.9 % bolus 500 mL (has no administration in time range)     Procedures Critical Care Critical Care Documentation Critical care time provided by me (excluding procedures): 32 minutes  Condition necessitating critical care: concern for acute coronary syndrome, cardiac arrhythmia, hypotension  Components of critical care management: reviewing of prior records, laboratory and imaging interpretation, frequent re-examination and reassessment of vital signs, administration of IV fluids    ED Course and Medical Decision Making  I have reviewed the triage vital signs and the nursing notes.  Pertinent labs & imaging results that were available during my care of the patient were reviewed by me and considered in my medical decision making (see below for details).  On arrival patient is improved, following commands, moving all extremities, denies ever having chest pain.  EKG more consistent with a left bundle branch block with discordant change, not meeting STEMI criteria.  Discussed with cardiology, Cath Lab canceled.  Will interrogate pacemaker, admit to hospital service.  Barth Kirks. [place]onia Small, [place] [email or web address]  Final Clinical Impressions(s) / ED Diagnoses     ICD-10-CM   1. Unresponsive episode R41.89   2. Altered behavior R46.89 DG Chest [facility] 1 View    DG Chest Port 1 View  3. Hypotension, unspecified hypotension type I95.9     ED Discharge Orders    None         Maudie Flakes, MD 04/27/18  [phone]

## 2018-04-27 NOTE — Consult Note (Addendum)
[facility] Renal Consultation Note    Indication for Consultation:  Management of ESRD/hemodialysis, anemia, hypertension/volume, and secondary hyperparathyroidism. PCP:  HPI: Sean Best is a 85 y.o. male. He presented to the ED from his dialysis unit today after a period of unresponsiveness. Per report from outpatient dialysis unit, patient received approximately 15  Minutes of dialysis when his systolic BP dropped to the 70s and he became unresponsive with bradycardic heart rate. He received approximately 600 cc of normal saline and EMS was called. Reportedly remained unconscious until EMS arrived and placed him on a stretcher, at which point he began to return to baseline. EKG showed ST changes and patient was initially a code STEMI. On presentation to the ED, EKG was more consistent with left bundle branch block and code status was subsequently cancelled. Troponin 0.08, K 4.6, Ca 8.6, Hgb 8.7 and WBC 11.6. Blood pressure is currently elevated at 161/104. Patient has a history of sick sinus syndrome status post pacemaker placement.   On exam, patient is alert and oriented x4. Reports he knew his blood pressure was getting low during dialysis but does not recall subsequent events. He had missed his dialysis treatment on 04/25/2018 and had a shortened treatment on 04/22/2018. Denies CP, palpitations, SOB/dyspnea, wheezing, cough. Denies N/V and abdominal pain. Reports he was having diarrhea on 04/25/2018 which is why he missed dialysis, but now resolved.   Past Medical History:  Diagnosis Date  . Anemia   . Arthritis    "knees" (04/11/2016)  . Blind left eye    S/P trauma  . Chronic total occlusion of artery of the extremities (HCC)    pt not aware of this  . Claudication ([place])   . ESRD on dialysis [facility])    "MWF; [name]" ((04/10/2016)  . ESRD on peritoneal dialysis [facility])    Started dialysis around April 2015 per son.  Has been doing peritoneal dialysis at home.     . Gout    . Hyperlipidemia   . Hypertension   . Overweight(278.02)   . Presence of permanent cardiac pacemaker    medtronic  . Prostate cancer ([place]) 1990s  . Shingles   . Stroke [facility]) ~1998   denies residual on 04/11/2016  . Type II diabetes mellitus (HCC)    diet controlled  . Umbilical hernia 1/61/0960   Past Surgical History:  Procedure Laterality Date  . AV FISTULA PLACEMENT, RADIOCEPHALIC  45/40/9811   Left arm  . CAPD INSERTION N/A 03/06/2013   Procedure: LAPAROSCOPIC INSERTION CONTINUOUS AMBULATORY PERITONEAL DIALYSIS  (CAPD) CATHETER;  Surgeon: Edward Jolly, MD;  Location: [place];  Service: General;  Laterality: N/A;  . CATARACT EXTRACTION Right   . CHOLECYSTECTOMY  12/02/2017   LAPROSCOPIC  . CORONARY STENT INTERVENTION N/A 02/11/2018   Procedure: CORONARY STENT INTERVENTION;  Surgeon: Sherren Mocha, MD;  Location: [place] [facility];  Service: Cardiovascular;  Laterality: N/A;  . EYE SURGERY Left 1970s   for eye injury  . HERNIA REPAIR    . INSERTION PROSTATE RADIATION SEED    . LAPAROSCOPIC CHOLECYSTECTOMY SINGLE SITE WITH INTRAOPERATIVE CHOLANGIOGRAM N/A 12/02/2017   Procedure: LAPAROSCOPIC CHOLECYSTECTOMY;  Surgeon: Michael Boston, MD;  Location: [place];  Service: General;  Laterality: N/A;  . LEFT HEART CATH AND CORONARY ANGIOGRAPHY N/A 02/11/2018   Procedure: LEFT HEART CATH AND CORONARY ANGIOGRAPHY;  Surgeon: Sherren Mocha, MD;  Location: [place] [facility];  Service: Cardiovascular;  Laterality: N/A;  . PACEMAKER IMPLANT N/A 04/20/2017   Procedure:  PACEMAKER IMPLANT;  Surgeon: Evans Lance, MD;  Location: [place] [facility];  Service: Cardiovascular;  Laterality: N/A;  . UMBILICAL HERNIA REPAIR N/A 03/06/2013   Procedure: HERNIA REPAIR UMBILICAL WITH MESH;  Surgeon: Edward Jolly, MD;  Location[facility] OR;  Service: General;  Laterality: N/A;   Family History  Problem Relation Age of Onset  . Cancer Mother   . Heart disease Mother   . Cancer Father    Social  History:  reports that he has never smoked. He has never used smokeless tobacco. He reports that he does not drink alcohol or use drugs.  ROS: As per HPI otherwise negative.  Physical Exam: Vitals:   04/27/18 1400 04/27/18 1430 04/27/18 1500 04/27/18 1530  BP: 117/85 (!) 152/91 (!) 166/108 (!) 166/107  Pulse: 88 89 92 82  Resp: 17 (!) 0 18 17  SpO2: 99% 99% 99% 97%     General: Well developed, obese male in NAC Head: Normocephalic, atraumatic. PERRLA, sclera non-icteric, + AV knicking and arterial narrowing. mucus membranes are moist. Neck: Supple without masses. Palpable posterior lymph nodes. JVD not elevated. Lungs: Respirations even and unlabored. + expiratory wheezes LLL Heart: RRR with normal S1, S2. 3/6 systolic ejection murmur. + pacemaker Abdomen: Soft, non-tender, non-distended with normoactive bowel sounds. No rebound/guarding. No obvious abdominal masses.Liver down 5 cm Musculoskeletal:  Strength and tone appear normal for age. Lower extremities: 2+ edema b/l lower extremities, 1+ pedal pulses Neuro: Alert and oriented X 3. Moves all extremities spontaneously. Psych:  Responds to questions appropriately with a normal affect.  Dialysis Access: L FA avf  Allergies  Allergen Reactions  . Cardura [Doxazosin Mesylate] Other (See Comments)    Hallucinations  . Tape     Paper tape   Prior to Admission medications   Medication Sig Start Date End Date Taking? Authorizing Provider  allopurinol (ZYLOPRIM) 100 MG tablet Take 100 mg by mouth daily.     Yes [provider]  amLODipine (NORVASC) 10 MG tablet Take 10 mg by mouth at bedtime.  01/04/17  Yes [provider]  aspirin 81 MG chewable tablet Chew 1 tablet (81 mg total) by mouth daily. 02/14/18  Yes Oretha Milch D, MD  atorvastatin (LIPITOR) 40 MG tablet Take 40 mg by mouth every evening.  07/07/17  Yes [provider]  calcium acetate (PHOSLO) 667 MG capsule Take 1,334 mg by mouth 3 (three) times  daily with meals. And 1 capsule (667mg ) with a snack at bedtime   Yes [provider]  Cholecalciferol (VITAMIN D-3) 1000 units CAPS Take 1,000 Units by mouth daily.   Yes [provider]  Cinacalcet HCl (SENSIPAR PO) Take by mouth every Monday, Wednesday, and Friday.   Yes [provider]  escitalopram (LEXAPRO) 10 MG tablet Take 10 mg by mouth daily. 04/06/18  Yes [provider]  ferrous sulfate 325 (65 FE) MG EC tablet Take 325 mg by mouth 2 (two) times daily with a meal.    Yes [provider]  gabapentin (NEURONTIN) 100 MG capsule Take 100 mg by mouth at bedtime. 12/27/16  Yes [provider]  isosorbide mononitrate (IMDUR) 30 MG 24 hr tablet Take 1 tablet (30 mg total) by mouth daily. Patient taking differently: Take 30 mg by mouth 2 (two) times daily. Taking on Cain Saupe, Sat and Sunday 02/14/18  Yes Oretha Milch D, MD  lidocaine-prilocaine (EMLA) cream Apply 1 application topically as needed (for port access).  10/28/17  Yes [provider]  lisinopril (PRINIVIL,ZESTRIL) 20 MG tablet Take 20 mg by mouth at bedtime. 02/04/18  Yes [provider]  methocarbamol (ROBAXIN) 500 MG tablet Take 500 mg by mouth 3 (three) times daily as needed. 02/22/18  Yes [provider]  multivitamin (RENA-VIT) TABS tablet Take 1 tablet by mouth daily.   Yes [provider]  nitroGLYCERIN (NITROSTAT) 0.4 MG SL tablet Place 1 tablet (0.4 mg total) under the tongue every 5 (five) minutes as needed for up to 25 days for chest pain. 03/24/18 04/27/18 Yes Almyra Deforest, PA  pantoprazole (PROTONIX) 40 MG tablet Take 1 tablet (40 mg total) by mouth 2 (two) times daily. Patient taking differently: Take 40 mg by mouth daily.  02/13/18 04/27/18 Yes Oretha Milch D, MD  ticagrelor (BRILINTA) 90 MG TABS tablet Take 1 tablet (90 mg total) by mouth 2 (two) times daily. 02/13/18  Yes Desiree Hane, MD  VELPHORO 500 MG chewable tablet Chew 500  mg by mouth 3 (three) times daily as needed (to break down phosphoru--swhen not taking the phoslo).  01/28/18  Yes [provider]   Current Facility-Administered Medications  Medication Dose Route Frequency Provider Last Rate Last Dose  . [START ON 04/28/2018] Chlorhexidine Gluconate Cloth 2 % PADS 6 each  6 each Topical Q0600 Loren Racer, PA-C      . sodium chloride 0.9 % bolus 500 mL  500 mL Intravenous Once Maudie Flakes, MD       Current Outpatient Medications  Medication Sig Dispense Refill  . allopurinol (ZYLOPRIM) 100 MG tablet Take 100 mg by mouth daily.      Marland Kitchen amLODipine (NORVASC) 10 MG tablet Take 10 mg by mouth at bedtime.   9  . aspirin 81 MG chewable tablet Chew 1 tablet (81 mg total) by mouth daily. 60 tablet 2  . atorvastatin (LIPITOR) 40 MG tablet Take 40 mg by mouth every evening.   3  . calcium acetate (PHOSLO) 667 MG capsule Take 1,334 mg by mouth 3 (three) times daily with meals. And 1 capsule (667mg ) with a snack at bedtime    . Cholecalciferol (VITAMIN D-3) 1000 units CAPS Take 1,000 Units by mouth daily.    . Cinacalcet HCl (SENSIPAR PO) Take by mouth every Monday, Wednesday, and Friday.    . escitalopram (LEXAPRO) 10 MG tablet Take 10 mg by mouth daily.    . ferrous sulfate 325 (65 FE) MG EC tablet Take 325 mg by mouth 2 (two) times daily with a meal.     . gabapentin (NEURONTIN) 100 MG capsule Take 100 mg by mouth at bedtime.  10  . isosorbide mononitrate (IMDUR) 30 MG 24 hr tablet Take 1 tablet (30 mg total) by mouth daily. (Patient taking differently: Take 30 mg by mouth 2 (two) times daily. Taking on Tues, Thurs, Sat and Sunday) 60 tablet 0  . lidocaine-prilocaine (EMLA) cream Apply 1 application topically as needed (for port access).   11  . lisinopril (PRINIVIL,ZESTRIL) 20 MG tablet Take 20 mg by mouth at bedtime.    . methocarbamol (ROBAXIN) 500 MG tablet Take 500 mg by mouth 3 (three) times daily as needed.    . multivitamin (RENA-VIT) TABS  tablet Take 1 tablet by mouth daily.    . nitroGLYCERIN (NITROSTAT) 0.4 MG SL tablet Place 1 tablet (0.4 mg total) under the tongue every 5 (five) minutes as needed for up to 25 days for chest pain. 25 tablet 3  . pantoprazole (PROTONIX) 40  MG tablet Take 1 tablet (40 mg total) by mouth 2 (two) times daily. (Patient taking differently: Take 40 mg by mouth daily. ) 60 tablet 0  . ticagrelor (BRILINTA) 90 MG TABS tablet Take 1 tablet (90 mg total) by mouth 2 (two) times daily. 60 tablet 3  . VELPHORO 500 MG chewable tablet Chew 500 mg by mouth 3 (three) times daily as needed (to break down phosphoru--swhen not taking the phoslo).   3   Labs: Basic Metabolic Panel: Recent Labs  Lab 04/27/18 1304  NA 141  K 4.6  CL 100  CO2 24  GLUCOSE 138*  BUN 50*  CREATININE 10.71*  CALCIUM 8.6*   Liver Function Tests: Recent Labs  Lab 04/27/18 1304  AST 17  ALT 12  ALKPHOS 48  BILITOT 0.4  PROT 5.7*  ALBUMIN 3.1*   CBC: Recent Labs  Lab 04/27/18 1304  WBC 11.6*  HGB 8.7*  HCT 31.1*  MCV 94.5  PLT 232   Studies/Results: Dg Chest Port 1 View  Result Date: 04/27/2018 CLINICAL DATA:  Altered mental status, history of stroke EXAM: PORTABLE CHEST 1 VIEW COMPARISON:  02/10/2018 FINDINGS: There is no focal parenchymal opacity. There is no pleural effusion or pneumothorax. The heart and mediastinal contours are unremarkable. There is a dual lead cardiac pacemaker. The osseous structures are unremarkable. IMPRESSION: No active disease. Electronically Signed   By: Kathreen Devoid   On: 04/27/2018 13:45    Dialysis Orders: [facility]  on MWF. Time: 4 hours. 2K, 2Ca, EDW 87.5kg. Qb 450, Auto 2.0 Sensipar 150  Heparin 6000 unit bolus Mircera 225 mcg IV q2 weeks (given 286mcg on 2/19) Calcitriol 1 mcg PO 3x week during dialysis   Assessment/Plan: 1.  Unresponsive episode: During dialysis on 04/27/2018 with low BP and bradycardia reported. Now back to baseline. Being admitted for cardiac  work up and echocardiogram according to primary note. Management per primary. Not clearly related to hypovolemia 2. Sick sinus syndrome: S/p pacemaker placement, management per primary/cardiology.  3.  ESRD:  Last dialysis 04/22/2018. Currently significantly volume overloaded. K at goal. Will plan for dialysis this evening with UF goal of 3.5L.  4.  Hypertension/volume: BP elevated and significant edema on exam. For HD today with UFG 3.5L. 5.  Anemia: Hgb 8.7, last outpatient hemoglobin 9.4. on max mircera as outpatient with last dose 04/20/2018. Not due for ESA yet.  6.  Metabolic bone disease: Corrected calcium 9.3. Phos pending. Continue outpatient sensipar, calcitriol and velphoro.  7.  Nutrition:  Albumin 3.1. Will start pro-stat supplement. Continue renal vitamin.  8. DM  Anice Paganini, PA-C 04/27/2018, 4:37 PM  [facility] Pager: [phone] I have seen and examined this patient and agree with the plan of care seen, eval, examined,counseled patient, discussed with PA .  [name] Alaijah Gibler 04/27/2018, 9:05 PM

## 2018-04-28 ENCOUNTER — Other Ambulatory Visit (HOSPITAL_COMMUNITY): Payer: Medicare Other

## 2018-04-28 LAB — GLUCOSE, CAPILLARY
GLUCOSE-CAPILLARY: 84 mg/dL (ref 70–99)
Glucose-Capillary: 79 mg/dL (ref 70–99)
Glucose-Capillary: 68 mg/dL — ABNORMAL LOW (ref 70–99)
Glucose-Capillary: 69 mg/dL — ABNORMAL LOW (ref 70–99)
Glucose-Capillary: 125 mg/dL — ABNORMAL HIGH (ref 70–99)
Glucose-Capillary: 136 mg/dL — ABNORMAL HIGH (ref 70–99)
Glucose-Capillary: 111 mg/dL — ABNORMAL HIGH (ref 70–99)

## 2018-04-28 LAB — CK TOTAL AND CKMB (NOT AT ARMC)
CK, MB: 7.3 ng/mL — ABNORMAL HIGH (ref 0.5–5.0)
Relative Index: 6 — ABNORMAL HIGH (ref 0.0–2.5)
Total CK: 121 U/L (ref 49–397)

## 2018-04-28 LAB — TROPONIN I
Troponin I: 0.14 ng/mL (ref <0.03)
Troponin I: 0.13 ng/mL (ref <0.03)

## 2018-04-28 MED ORDER — ALBUTEROL SULFATE (2.5 MG/3ML) 0.083% IN NEBU
2.5000 mg | INHALATION_SOLUTION | Freq: Two times a day (BID) | RESPIRATORY_TRACT | Status: DC
  Administered 2018-04-28 – 2018-04-29 (×3): 2.5 mg via RESPIRATORY_TRACT
  Filled 2018-04-28 (×3): qty 3

## 2018-04-28 MED ORDER — SALINE SPRAY 0.65 % NA SOLN
1.0000 | NASAL | Status: DC | PRN
  Administered 2018-04-28: 1 via NASAL
  Filled 2018-04-28: qty 44

## 2018-04-28 MED ORDER — CALCIUM ACETATE (PHOS BINDER) 667 MG PO CAPS
2001.0000 mg | ORAL_CAPSULE | Freq: Three times a day (TID) | ORAL | Status: DC
  Administered 2018-04-28 – 2018-04-30 (×6): 2001 mg via ORAL
  Filled 2018-04-28 (×6): qty 3

## 2018-04-28 MED ORDER — HEPARIN SODIUM (PORCINE) 1000 UNIT/ML IJ SOLN
INTRAMUSCULAR | Status: AC
  Administered 2018-04-28: 5000 [IU] via INTRAVENOUS_CENTRAL
  Filled 2018-04-28: qty 5

## 2018-04-28 MED ORDER — SODIUM CHLORIDE 0.9 % IV SOLN
510.0000 mg | Freq: Once | INTRAVENOUS | Status: AC
  Administered 2018-04-28: 510 mg via INTRAVENOUS
  Filled 2018-04-28: qty 17

## 2018-04-28 MED ORDER — GUAIFENESIN-DM 100-10 MG/5ML PO SYRP
5.0000 mL | ORAL_SOLUTION | ORAL | Status: DC | PRN
  Administered 2018-04-28: 5 mL via ORAL
  Filled 2018-04-28: qty 5

## 2018-04-28 NOTE — Progress Notes (Signed)
Tolerated dialysis well. Able to UF 3.5L without issue as ordered. Patient without complaints. Denies pain. Is somewhat frustrated with dry non-productive cough. States ice chips help. CK, echo, and diet ordered per primary MD. Communicated to primary RN. Left unit in stable condition.

## 2018-04-28 NOTE — Progress Notes (Signed)
Pt's HD tx orders modified for today 04/28/2018 first shift as ordered by on calll nephrologist. Primary nurse made aware. Per primalry nurse pt is stable.

## 2018-04-28 NOTE — Progress Notes (Signed)
Patient is back from Dialysis.     04/28/18 1207  Vitals  BP (!) 154/90  MAP (mmHg) 110  BP Method Automatic  Patient Position (if appropriate) Lying  Pulse Rate 92  Pulse Rate Source Monitor  Oxygen Therapy  SpO2 98 %  O2 Device Room Air  Pain Assessment  Pain Scale 0-10  Pain Score 0  MEWS Score  MEWS RR 0  MEWS Pulse 0  MEWS Systolic 0  MEWS LOC 0  MEWS Temp 0  MEWS Score 0  MEWS Score Color [name]

## 2018-04-28 NOTE — Progress Notes (Signed)
Hypoglycemic Event  CBG: 68  Treatment: 15 minute protocol juice and crackers Symptoms: asymptomatic  Follow-up CBG: Time:1332 CBG Result:84   Comments/MD notified: Samtani MD.    Julianne Handler

## 2018-04-28 NOTE — Procedures (Signed)
I was present at this session.  I have reviewed the session itself and made appropriate changes.  HDvia LLA avf , access press ok. Vol xs. bp [name] Darrian Grzelak 2/27/20208:13 AM

## 2018-04-28 NOTE — Progress Notes (Signed)
Hypoglycemic Event  CBG: 69  Treatment: 15 minute protocol  Symptoms:asymptomatic  Follow-up CBG: Time:1308 CBG Result:68       Sean Best Rhett Bannister

## 2018-04-28 NOTE — Progress Notes (Signed)
Subjective: Interval History: has complaints did not sleep well.  Objective: Vital signs in last 24 hours: Temp:  [98.3 F (36.8 C)-99.1 F (37.3 C)] 98.3 F (36.8 C) (02/27 0723) Pulse Rate:  [82-100] 96 (02/27 0800) Resp:  [0-29] 19 (02/27 0800) BP: (117-184)/(78-113) 162/100 (02/27 0800) SpO2:  [93 %-100 %] 96 % (02/27 0723) Weight:  [94 kg-94.4 kg] 94.4 kg (02/27 0723) Weight change:   Intake/Output from previous day: 02/26 0701 - 02/27 0700 In: 480 [P.O.:480] Out: -  Intake/Output this shift: No intake/output data recorded.  General appearance: alert, cooperative, no distress and pale Resp: diminished breath sounds bilaterally Chest wall: no tenderness, Pacer L [place] Cardio: S1, S2 normal and systolic murmur: systolic ejection 2/6, crescendo and decrescendo at 2nd left intercostal space GI: soft. liver down 5 cm, nontender Extremities: AVF LLA, 2 + edema  Lab Results: Recent Labs    04/27/18 1304  WBC 11.6*  HGB 8.7*  HCT 31.1*  PLT 232   BMET:  Recent Labs    04/27/18 1304 04/27/18 1953  NA 141  --   K 4.6  --   CL 100  --   CO2 24  --   GLUCOSE 138*  --   BUN 50*  --   CREATININE 10.71* 11.61*  CALCIUM 8.6*  --    No results for input(s): PTH in the last 72 hours. Iron Studies: No results for input(s): IRON, TIBC, TRANSFERRIN, FERRITIN in the last 72 hours.  Studies/Results: Dg Chest Port 1 View  Result Date: 04/27/2018 CLINICAL DATA:  Altered mental status, history of stroke EXAM: PORTABLE CHEST 1 VIEW COMPARISON:  02/10/2018 FINDINGS: There is no focal parenchymal opacity. There is no pleural effusion or pneumothorax. The heart and mediastinal contours are unremarkable. There is a dual lead cardiac pacemaker. The osseous structures are unremarkable. IMPRESSION: No active disease. Electronically Signed   By: Kathreen Devoid   On: 04/27/2018 13:45    I have reviewed the patient's current medications.  Assessment/Plan: 1 ESRD vol xs. For HD 2 HTN  lower vol 3 syncope ??etio,not primary bp/vol issue 4 Anemia esa, check Fe at center 5 HPTH vit D,  6 Dm controlled 7 Pacer P HD, esa, check Fe,     LOS: 1 day   [name] Kally Cadden 04/28/2018,8:14 AM

## 2018-04-28 NOTE — Progress Notes (Signed)
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 Complete heart block status post Medtronic PPM HLD HTN Prior prostate cancer History herpes zoster Prior CVA DM TY 2 PAD  Recent admission 02/10/2018 through 02/13/2018 with shortness of breath chest pain and had 2 DES stents to circumflex-EF was normal   Patient returned to the hospital with presyncopal episode at the beginning of dialysis-given 600 cc NS unconscious-EKG was more consistent with LBP troponin 0 0.08 hemoglobin 8 WBC 11 hypotensive and was given IV saline for resuscitation patient remembered whole event  Nephrology saw the patient felt he was not volume depleted and may be volume overloaded  A & Plan Syncope Patient endorses several days prior to admission of diarrhea starting on 2/22 and ending on 2/24 He does not have any chest pain whatsoever and recalls clearly the severity of the chest pain recent admission when he had stents placed  His left bundle branch block is less pronounced than when he was admitted 01/2018--By Sgarbossa Criterion I find it hard to call this significant ACS ECHO has been ordered which is pending We will watchfully wait on echocardiogram CK-MB is slightly elevated at 7 but this is not specific and patient has ESRD CAD status post 2 DES to circumflex 12/14 2019 Continue Brilinta 90 daily, 81 daily, Imdur 30 daily ESRD MWF Defer to nephrology further work-up and dialysis planning Continue Sensipar 30 Monday Wednesday Friday PhosLo 2.0 g 3 times daily meals, Velphoro 500 3 times daily as needed Anemia of renal disease Continue Feraheme as per renal Complete heart block status post PPM Medtronic Stable at this time keep on telemetry overnight Prior history prostate cancer Stable at this  time HTN-continue meds as above Not clear if will be challenging EDW BMI 34 Encourage as outpatient weight loss  DVT Lovenox code Status: Full code communication: No family disposition Plan: Inpatient   [place], MD  [facility] Via amion app OR [email or web address] 7PM-7AM contact night coverage as above 04/28/2018, 10:45 AM  LOS: 1 day   Consultants:  renal  Procedures:  n  Antimicrobials:  n  Interval history/Subjective:  Awake alert pleasant no recurrence of any issues tells me about diarrhea  Objective:  Vitals:  Vitals:   04/28/18 1000 04/28/18 1030  BP: (!) 151/92 (!) 158/94  Pulse: 92 91  Resp: (!) 22 20  Temp:    SpO2:      Exam:  awake alert pleasant ncat no ict no pallor cta b no added sound s1 s2 no m/r/g abd soft nt nd no rebound no guard No le edema Neurologically intacct moves all 4 limbs Psych euthymic   I have personally reviewed the following:  DATA   Labs:  CK-MB 7.3  Troponin 0.13--peak 0.15  Imaging studies:  CXR 2/26 IMPRESSION: No active disease.  Medical tests:  n   Test discussed with performing physician:  n  Decision to obtain old records:  n  Review and summation of old records:  nn  Scheduled Meds: . albuterol  2.5 mg Nebulization TID  . allopurinol  100 mg Oral Daily  . aspirin  81 mg Oral Daily  . atorvastatin  40 mg Oral QPM  . calcium acetate  2,001 mg Oral TID WC  . cinacalcet  30 mg Oral Q M,W,F  . feeding supplement (PRO-STAT SUGAR FREE 64)  30 mL Oral BID  . gabapentin  100 mg Oral QHS  . heparin  5,000 Units Subcutaneous Q8H  . isosorbide mononitrate  30 mg Oral Daily  . multivitamin  1 tablet Oral QHS  . pantoprazole  40 mg Oral BID  . sodium chloride flush  3 mL Intravenous Q12H  . ticagrelor  90 mg Oral BID   Continuous Infusions: . sodium chloride    . sodium chloride    . sodium chloride    . sodium chloride Stopped (04/27/18 1839)    Active Problems:   Syncope    LOS: 1 day

## 2018-04-29 ENCOUNTER — Inpatient Hospital Stay (HOSPITAL_COMMUNITY): Payer: Medicare Other

## 2018-04-29 DIAGNOSIS — I340 Nonrheumatic mitral (valve) insufficiency: Secondary | ICD-10-CM

## 2018-04-29 LAB — RENAL FUNCTION PANEL
ALBUMIN: 3.3 g/dL — AB (ref 3.5–5.0)
Anion gap: 13 (ref 5–15)
BUN: 34 mg/dL — ABNORMAL HIGH (ref 8–23)
CO2: 25 mmol/L (ref 22–32)
Calcium: 9.6 mg/dL (ref 8.9–10.3)
Chloride: 97 mmol/L — ABNORMAL LOW (ref 98–111)
Creatinine, Ser: 8.11 mg/dL — ABNORMAL HIGH (ref 0.61–1.24)
GFR calc Af Amer: 6 mL/min — ABNORMAL LOW (ref >60)
GFR calc non Af Amer: 6 mL/min — ABNORMAL LOW (ref >60)
Glucose, Bld: 127 mg/dL — ABNORMAL HIGH (ref 70–99)
PHOSPHORUS: 6.6 mg/dL — AB (ref 2.5–4.6)
POTASSIUM: 4.1 mmol/L (ref 3.5–5.1)
Sodium: 135 mmol/L (ref 135–145)

## 2018-04-29 LAB — GLUCOSE, CAPILLARY
GLUCOSE-CAPILLARY: 76 mg/dL (ref 70–99)
Glucose-Capillary: 156 mg/dL — ABNORMAL HIGH (ref 70–99)
Glucose-Capillary: 115 mg/dL — ABNORMAL HIGH (ref 70–99)

## 2018-04-29 LAB — CBC
HCT: 29.5 % — ABNORMAL LOW (ref 39.0–52.0)
Hemoglobin: 8.6 g/dL — ABNORMAL LOW (ref 13.0–17.0)
MCH: 26.5 pg (ref 26.0–34.0)
MCHC: 29.2 g/dL — ABNORMAL LOW (ref 30.0–36.0)
MCV: 91 fL (ref 80.0–100.0)
Platelets: 213 K/uL (ref 150–400)
RBC: 3.24 MIL/uL — ABNORMAL LOW (ref 4.22–5.81)
RDW: 19.6 % — ABNORMAL HIGH (ref 11.5–15.5)
WBC: 8.5 K/uL (ref 4.0–10.5)
nRBC: 0 % (ref 0.0–0.2)

## 2018-04-29 LAB — ECHOCARDIOGRAM COMPLETE
Height: 65 in
WEIGHTICAEL: 3204.8 [oz_av]

## 2018-04-29 MED ORDER — HEPARIN SODIUM (PORCINE) 1000 UNIT/ML DIALYSIS
1000.0000 [IU] | INTRAMUSCULAR | Status: DC | PRN
  Filled 2018-04-29: qty 1

## 2018-04-29 MED ORDER — SODIUM CHLORIDE 0.9 % IV SOLN: 100.0000 mL | INTRAVENOUS | Status: DC | PRN

## 2018-04-29 MED ORDER — ALBUTEROL SULFATE (2.5 MG/3ML) 0.083% IN NEBU: 2.5000 mg | INHALATION_SOLUTION | Freq: Four times a day (QID) | RESPIRATORY_TRACT | Status: DC | PRN

## 2018-04-29 MED ORDER — HEPARIN SODIUM (PORCINE) 1000 UNIT/ML DIALYSIS
100.0000 [IU]/kg | INTRAMUSCULAR | Status: DC | PRN
  Filled 2018-04-29: qty 10

## 2018-04-29 MED ORDER — CHLORHEXIDINE GLUCONATE CLOTH 2 % EX PADS
6.0000 | MEDICATED_PAD | Freq: Every day | CUTANEOUS | Status: DC
  Administered 2018-04-29: 6 via TOPICAL

## 2018-04-29 NOTE — Progress Notes (Signed)
  Orthostatic vitals are as follows:    04/29/18 0730  Orthostatic Lying   BP- Lying (!) 169/105  Pulse- Lying 98  Orthostatic Sitting  BP- Sitting (!) 171/106  Pulse- Sitting 99  Orthostatic Standing at 0 minutes  BP- Standing at 0 minutes (!) 164/101  Pulse- Standing at 0 minutes 102  Orthostatic Standing at 3 minutes  BP- Standing at 3 minutes (!) 180/102  Pulse- Standing at 3 minutes 101

## 2018-04-29 NOTE — Care Management Important Message (Signed)
Important Message  Patient Details  Name: Sean Best MRN: [id number] Date of Birth: 07-22-1933   Medicare Important Message Given:  Yes    [name] 04/29/2018, 2:14 PM

## 2018-04-29 NOTE — Progress Notes (Signed)
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 Complete heart block status post Medtronic PPM HLD HTN Prior prostate cancer History herpes zoster Prior CVA DM TY 2 PAD  Recent admission 02/10/2018 through 02/13/2018 with shortness of breath chest pain and had 2 DES stents to circumflex-EF was normal  Patient returned to the hospital with presyncopal episode at the beginning of dialysis-given 600 cc NS unconscious-EKG was more consistent with LBB troponin 0 0.08 hemoglobin 8 WBC 11 hypotensive and was given IV saline for resuscitation patient remembered whole event  Nephrology saw the patient felt he was not volume depleted and may be volume overloaded  A & Plan  Syncope Patient endorses several days prior to admission of diarrhea starting on 2/22 and ending on 2/24 His left bundle branch block is less pronounced than when he was admitted 01/2018--By Sgarbossa Criterion I find it hard to call this significant ACS, but this is difficult in setting of DDDR ppm ECHO pending, orthostatics neg, not dizzy today CK-MB 7 Complete heart block status post PPM Medtronic SVT Patient appears to have had some ruins of SVT Curbsided Dr. Orene Desanctis who states PPM not interrogated and will arrange and see if anything else needs doing Will observe again overnight CAD status post 2 DES to circumflex 12/14 2019 Continue Brilinta 90 daily, 81 daily, Imdur 30 daily ESRD MWF Defer to nephrology further work-up and dialysis planning Continue Sensipar 30 Monday Wednesday Friday PhosLo 2.0 g 3 times daily meals, Velphoro 500 3 times daily as needed Anemia of renal disease Continue Feraheme as per renal Prior history prostate cancer Stable at this time HTN-continue meds as above Not clear if will be challenging  EDW BMI 34 Encourage as outpatient weight loss  DVT Lovenox  code Status: Full code  communication: called daughter to update disposition Plan:  Inpatient overnight   Verlon Au, MD  [facility] Via [employer] app OR [email or web address] 7PM-7AM contact night coverage as above 04/29/2018, 1:15 PM  LOS: 2 days   Consultants:  renal  Procedures:  n  Antimicrobials:  n  Interval history/Subjective:  Seen again on HD unit Eating drinking no recurence Moving around some  No cp  Objective:  Vitals:  Vitals:   04/29/18 1217 04/29/18 1230  BP: (!) 165/110 (!) 174/99  Pulse: 93 92  Resp: 17 12  Temp:    SpO2:      Exam:  pleasant ncat no ict no pallor cta b no added sound s1 s2 no m/r/g abd soft nt nd no rebound no guard No le edema Neurologically intacct moves all 4 limbs Psych euthymic  I have personally reviewed the following:  DATA  Labs:  CK-MB 7.3  Troponin 0.13--peak 0.15  Imaging studies:  CXR 2/26 IMPRESSION: No active disease.  Test discussed with performing physician:  D/w Dr. Orene Desanctis  Scheduled Meds: . albuterol  2.5 mg Nebulization BID  . allopurinol  100 mg Oral Daily  . aspirin  81 mg Oral Daily  . atorvastatin  40 mg Oral QPM  . calcium acetate  2,001 mg Oral TID WC  . Chlorhexidine Gluconate Cloth  6 each Topical Q0600  . cinacalcet  30 mg Oral Q M,W,F  . feeding supplement (PRO-STAT SUGAR FREE 64)  30 mL Oral BID  . gabapentin  100 mg Oral QHS  . heparin  5,000 Units  Subcutaneous Q8H  . isosorbide mononitrate  30 mg Oral Daily  . multivitamin  1 tablet Oral QHS  . pantoprazole  40 mg Oral BID  . sodium chloride flush  3 mL Intravenous Q12H  . ticagrelor  90 mg Oral BID   Continuous Infusions: . sodium chloride    . sodium chloride    . sodium chloride    . sodium chloride Stopped (04/27/18 1839)    Active Problems:   Syncope   LOS: 2 days

## 2018-04-29 NOTE — Progress Notes (Signed)
Patient is back from dialysis.

## 2018-04-29 NOTE — Procedures (Signed)
I was present at this session.  I have reviewed the session itself and made appropriate changes.  bp tol off. Access prss ok. Tol HD well. LLA avf  Mauricia Area 2/28/20202:26 PM

## 2018-04-29 NOTE — Progress Notes (Signed)
  Echocardiogram 2D Echocardiogram has been performed.  Marybelle Killings 04/29/2018, 11:26 AM

## 2018-04-29 NOTE — Progress Notes (Signed)
Subjective: Interval History: has no complaint .  Objective: Vital signs in last 24 hours: Temp:  [97.9 F (36.6 C)-98.3 F (36.8 C)] 98.3 F (36.8 C) (02/28 0409) Pulse Rate:  [84-99] 99 (02/28 0409) Resp:  [18-22] 18 (02/28 0409) BP: (142-164)/(85-104) 159/99 (02/28 0409) SpO2:  [96 %-100 %] 96 % (02/28 0409) Weight:  [90.6 kg-90.9 kg] 90.9 kg (02/28 0406) Weight change: 0.143 kg  Intake/Output from previous day: 02/27 0701 - 02/28 0700 In: 1200 [P.O.:1200] Out: 3500  Intake/Output this shift: No intake/output data recorded.  General appearance: alert, cooperative and no distress Resp: clear to auscultation bilaterally Cardio: S1, S2 normal and systolic murmur: systolic ejection 2/6, crescendo and decrescendo at 2nd left intercostal space GI: soft, liver down 4 cm Extremities: LLA AVF  Lab Results: Recent Labs    04/27/18 1304  WBC 11.6*  HGB 8.7*  HCT 31.1*  PLT 232   BMET:  Recent Labs    04/27/18 1304 04/27/18 1953  NA 141  --   K 4.6  --   CL 100  --   CO2 24  --   GLUCOSE 138*  --   BUN 50*  --   CREATININE 10.71* 11.61*  CALCIUM 8.6*  --    No results for input(s): PTH in the last 72 hours. Iron Studies: No results for input(s): IRON, TIBC, TRANSFERRIN, FERRITIN in the last 72 hours.  Studies/Results: Dg Chest Port 1 View  Result Date: 04/27/2018 CLINICAL DATA:  Altered mental status, history of stroke EXAM: PORTABLE CHEST 1 VIEW COMPARISON:  02/10/2018 FINDINGS: There is no focal parenchymal opacity. There is no pleural effusion or pneumothorax. The heart and mediastinal contours are unremarkable. There is a dual lead cardiac pacemaker. The osseous structures are unremarkable. IMPRESSION: No active disease. Electronically Signed   By: Kathreen Devoid   On: 04/27/2018 13:45    I have reviewed the patient's current medications.  Assessment/Plan: 1 ESRD HD today to get on schedule 2 HTN lower vol, cont med 3 Anemia stable 4 HPTH see if phos comes  down here taking meds 5 Missing HD  Long habit, Nonadherence 6 syncope cause not clear 7 DM controlled P HD, binders vit D , monitor    LOS: 2 days   [name] Letita Prentiss 04/29/2018,7:38 AM

## 2018-04-30 ENCOUNTER — Encounter (HOSPITAL_COMMUNITY): Payer: Self-pay

## 2018-04-30 LAB — RENAL FUNCTION PANEL
Albumin: 3.2 g/dL — ABNORMAL LOW (ref 3.5–5.0)
Anion gap: 13 (ref 5–15)
BUN: 21 mg/dL (ref 8–23)
CHLORIDE: 96 mmol/L — AB (ref 98–111)
CO2: 27 mmol/L (ref 22–32)
Calcium: 8.9 mg/dL (ref 8.9–10.3)
Creatinine, Ser: 5.57 mg/dL — ABNORMAL HIGH (ref 0.61–1.24)
GFR calc Af Amer: 10 mL/min — ABNORMAL LOW (ref >60)
GFR calc non Af Amer: 9 mL/min — ABNORMAL LOW (ref >60)
Glucose, Bld: 101 mg/dL — ABNORMAL HIGH (ref 70–99)
Phosphorus: 5.3 mg/dL — ABNORMAL HIGH (ref 2.5–4.6)
Potassium: 3.9 mmol/L (ref 3.5–5.1)
Sodium: 136 mmol/L (ref 135–145)

## 2018-04-30 LAB — CBC WITH DIFFERENTIAL/PLATELET
Abs Immature Granulocytes: 0.03 10*3/uL (ref 0.00–0.07)
Basophils Absolute: 0.1 10*3/uL (ref 0.0–0.1)
Basophils Relative: 1 %
EOS ABS: 1.8 10*3/uL — AB (ref 0.0–0.5)
Eosinophils Relative: 22 %
HCT: 30.1 % — ABNORMAL LOW (ref 39.0–52.0)
Hemoglobin: 8.9 g/dL — ABNORMAL LOW (ref 13.0–17.0)
IMMATURE GRANULOCYTES: 0 %
Lymphocytes Relative: 16 %
Lymphs Abs: 1.3 10*3/uL (ref 0.7–4.0)
MCH: 26.6 pg (ref 26.0–34.0)
MCHC: 29.6 g/dL — ABNORMAL LOW (ref 30.0–36.0)
MCV: 89.9 fL (ref 80.0–100.0)
Monocytes Absolute: 0.8 10*3/uL (ref 0.1–1.0)
Monocytes Relative: 10 %
NEUTROS PCT: 51 %
NRBC: 0 % (ref 0.0–0.2)
Neutro Abs: 4.3 10*3/uL (ref 1.7–7.7)
Platelets: 220 10*3/uL (ref 150–400)
RBC: 3.35 MIL/uL — ABNORMAL LOW (ref 4.22–5.81)
RDW: 19.3 % — AB (ref 11.5–15.5)
WBC: 8.3 10*3/uL (ref 4.0–10.5)

## 2018-04-30 LAB — GLUCOSE, CAPILLARY
Glucose-Capillary: 91 mg/dL (ref 70–99)
Glucose-Capillary: 100 mg/dL — ABNORMAL HIGH (ref 70–99)

## 2018-04-30 MED ORDER — AMLODIPINE BESYLATE 2.5 MG PO TABS: 5.0000 mg | ORAL_TABLET | Freq: Every day | ORAL | Status: DC

## 2018-04-30 MED ORDER — CALCIUM ACETATE (PHOS BINDER) 667 MG PO CAPS: 2001.0000 mg | ORAL_CAPSULE | Freq: Three times a day (TID) | ORAL | 0 refills | Status: AC

## 2018-04-30 MED ORDER — ALBUTEROL SULFATE (2.5 MG/3ML) 0.083% IN NEBU: 2.5000 mg | INHALATION_SOLUTION | Freq: Four times a day (QID) | RESPIRATORY_TRACT | 12 refills | Status: DC | PRN

## 2018-04-30 MED ORDER — AMLODIPINE BESYLATE 5 MG PO TABS: 5.0000 mg | ORAL_TABLET | Freq: Every day | ORAL | Status: DC

## 2018-04-30 NOTE — Progress Notes (Signed)
Subjective: Interval History: has no complaint .  Objective: Vital signs in last 24 hours: Temp:  [97.6 F (36.4 C)-98.7 F (37.1 C)] 98.7 F (37.1 C) (02/29 0521) Pulse Rate:  [86-98] 94 (02/29 0521) Resp:  [12-23] 18 (02/29 0521) BP: (124-179)/(22-110) 168/104 (02/29 0521) SpO2:  [96 %-98 %] 98 % (02/29 0521) Weight:  [88.7 kg-92 kg] 88.7 kg (02/29 0521) Weight change: -2.4 kg  Intake/Output from previous day: 02/28 0701 - 02/29 0700 In: 723 [P.O.:720; I.V.:3] Out: 2349  Intake/Output this shift: No intake/output data recorded.  General appearance: alert, cooperative and no distress Resp: clear to auscultation bilaterally Cardio: S1, S2 normal, systolic murmur: systolic ejection 2/6, crescendo and decrescendo at 2nd left intercostal space and holosys M LLSB,  Pacer R  GI: soft, non-tender; bowel sounds normal; no masses,  no organomegaly Extremities: AVF LLA  Lab Results: Recent Labs    04/29/18 1246 04/30/18 0510  WBC 8.5 8.3  HGB 8.6* 8.9*  HCT 29.5* 30.1*  PLT 213 220   BMET:  Recent Labs    04/29/18 1245 04/30/18 0510  NA 135 136  K 4.1 3.9  CL 97* 96*  CO2 25 27  GLUCOSE 127* 101*  BUN 34* 21  CREATININE 8.11* 5.57*  CALCIUM 9.6 8.9   No results for input(s): PTH in the last 72 hours. Iron Studies: No results for input(s): IRON, TIBC, TRANSFERRIN, FERRITIN in the last 72 hours.  Studies/Results: No results found.  I have reviewed the patient's current medications.  Assessment/Plan: 1 ESRD stable 2 anemia got Fe, cont esa 3 CAD 4 HPTH vitn D , phos better here taking meds 5 HTN add amlod 6 syncope unexplained 7 eosinophilia - no rash, itching, ??no evident cause, no recent diff, follow P HD MWF, can d/c from our standpoint   LOS: 3 days   [name] Maico Mulvehill 04/30/2018,7:11 AM

## 2018-04-30 NOTE — Discharge Summary (Signed)
Physician Discharge Summary  Sean Best WLN:[id number] DOB: 19-Apr-1933 DOA: 04/27/2018  PCP: Seward Carol, MD  Admit date: 04/27/2018 Discharge date: 04/30/2018  Time spent: 25 minutes  Recommendations for Outpatient Follow-up:  1. Will need close follow-up with EP in the outpatient setting and with cardiology 2. Recommend cautious adjustment of EDW by nephrology as an outpatient 3. 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 Complete heart block status post Medtronic PPM HLD HTN Prior prostate cancer History herpes zoster Prior CVA DM TY 2 PAD  Recent admission 02/10/2018 through 02/13/2018 with shortness of breath chest pain and had 2 DES stents to circumflex-EF was normal  Patient returned to the hospital with presyncopal episode at the beginning of dialysis-given 600 cc NS unconscious-EKG was more consistent with LBB troponin 0 0.08 hemoglobin 8 WBC 11 hypotensive and was given IV saline for resuscitation patient remembered whole event  Nephrology saw the patient felt he was not volume depleted and may be volume overloaded   Hospital Course:  Syncope Patient endorses several days prior to admission of diarrhea starting on 2/22 and ending on 2/24 His left bundle branch block is less pronounced than when he was admitted 01/2018--By Sgarbossa Criterion I find it hard to call this significant ACS, but this is difficult in setting of DDDR ppm ECHO  - ischemic findings, although does show some mild aortic stenosis and in the right setting of hypovolemia could have caused issues Orthostatics negative  not dizzy Since he has had no ongoing symptoms no falls no chest pain feels well and is ambulating he is stabilized for discharge and will need follow-up with cardiology Complete heart block status post PPM Medtronic SVT Patient appears to have had some ruins of SVT Curbsided Dr. Orene Desanctis 2/28 3 and pacemaker was interrogated which did not seem to have any new findings, less than 1% burden of any SVT although there was an episode around 1230 on the day that he had syncope which could have been after the fall as a result of it CAD status post 2 DES to circumflex 12/14 2019 Continue Brilinta 90 daily, 81 daily, Imdur 30 daily ESRD MWF Defer to nephrology further work-up and dialysis planning Continue Sensipar 30 Monday Wednesday Friday PhosLo 2.0 g 3 times daily meals, Velphoro 500 3 times daily as needed Anemia of renal disease Continue Feraheme as per renal Prior history prostate cancer Stable at this time HTN-continue meds as above Not clear if will be challenging EDW BMI 34 Encourage as outpatient weight loss  Procedures:  Echocardiogram IMPRESSIONS    1. The left ventricle has normal systolic function with an ejection fraction of 60-65%. The cavity size was normal. There is severely increased left ventricular wall thickness. Left ventricular diastolic Doppler parameters are indeterminate.  2. The right ventricle has normal systolic function. The cavity was normal. There is mildly increased right ventricular wall thickness.  3. Left atrial size was severely dilated.  4. Right atrial size was mildly dilated.  5. The mitral valve is degenerative. Mild calcification of the mitral valve leaflet. There is moderate mitral annular calcification present.  6. The tricuspid valve  is normal in structure.  7. The aortic valve is tricuspid Moderate calcification of the aortic valve. Aortic valve regurgitation is trivial by color flow Doppler. mild-moderate stenosis of the aortic valve.  8.  The aortic root and ascending aorta are normal in size and structure.  9. The inferior vena cava was normal in size with <50% respiratory variability. 10. When compared to the prior study: 01/2018 - LVEF 60-65%, mild to moderate aortic stenosis.   Consultations:  Curb sided cardiology  Nephrology  Discharge Exam: Vitals:   04/30/18 0834 04/30/18 1159  BP: (!) 142/85 (!) 162/97  Pulse: 90 95  Resp:  18  Temp:  98.5 F (36.9 C)  SpO2:  98%    General: EOMI NCAT no icterus no pallor Cardiovascular: S1-S2 blowing holosystolic murmur across precordium Respiratory: Clinically clear no added sound Abdomen soft nontender no rebound no guarding  Discharge Instructions   Discharge Instructions    Diet - low sodium heart healthy   Complete by:  As directed    Discharge instructions   Complete by:  As directed    You should follow-up with your cardiologist within about 2 weeks to ensure discussion about your pacemaker and other things There was no reason found for your passing out spells despite as interrogating her pacemaker I suspect this may be because of your diarrhea that you had before coming in and you will need close follow-up in the outpatient setting with labs to ensure that we view on the right medications for blood pressure in addition to we managed your fluid volumes effectively   Increase activity slowly   Complete by:  As directed      Allergies as of 04/30/2018      Reactions   Cardura [doxazosin Mesylate] Other (See Comments)   Hallucinations   Tape    Paper tape      Medication List    STOP taking these medications   lisinopril 20 MG tablet Commonly known as:  PRINIVIL,ZESTRIL     TAKE these medications   albuterol (2.5 MG/3ML) 0.083% nebulizer solution Commonly known as:  PROVENTIL Take 3 mLs (2.5 mg total) by nebulization every 6 (six) hours as needed for wheezing or shortness of breath.   allopurinol 100 MG tablet Commonly known as:   ZYLOPRIM Take 100 mg by mouth daily.   amLODipine 5 MG tablet Commonly known as:  NORVASC Take 1 tablet (5 mg total) by mouth at bedtime. What changed:    medication strength  how much to take   aspirin 81 MG chewable tablet Chew 1 tablet (81 mg total) by mouth daily.   atorvastatin 40 MG tablet Commonly known as:  LIPITOR Take 40 mg by mouth every evening.   calcium acetate 667 MG capsule Commonly known as:  PHOSLO Take 3 capsules (2,001 mg total) by mouth 3 (three) times daily with meals. What changed:    how much to take  additional instructions   escitalopram 10 MG tablet Commonly known as:  LEXAPRO Take 10 mg by mouth daily.   ferrous sulfate 325 (65 FE) MG EC tablet Take 325 mg by mouth 2 (two) times daily with a meal.   gabapentin 100 MG capsule Commonly known as:  NEURONTIN Take 100 mg by mouth at bedtime.   isosorbide mononitrate 30 MG 24 hr tablet Commonly known as:  IMDUR Take 1 tablet (30 mg total) by mouth daily. What changed:    when to take this  additional instructions   lidocaine-prilocaine  cream Commonly known as:  EMLA Apply 1 application topically as needed (for port access).   methocarbamol 500 MG tablet Commonly known as:  ROBAXIN Take 500 mg by mouth 3 (three) times daily as needed.   multivitamin Tabs tablet Take 1 tablet by mouth daily.   nitroGLYCERIN 0.4 MG SL tablet Commonly known as:  NITROSTAT Place 1 tablet (0.4 mg total) under the tongue every 5 (five) minutes as needed for up to 25 days for chest pain.   pantoprazole 40 MG tablet Commonly known as:  PROTONIX Take 1 tablet (40 mg total) by mouth 2 (two) times daily. What changed:  when to take this   SENSIPAR PO Take by mouth every Monday, Wednesday, and Friday.   ticagrelor 90 MG Tabs tablet Commonly known as:  BRILINTA Take 1 tablet (90 mg total) by mouth 2 (two) times daily.   VELPHORO 500 MG chewable tablet Generic drug:  sucroferric oxyhydroxide Chew 500  mg by mouth 3 (three) times daily as needed (to break down phosphoru--swhen not taking the phoslo).   Vitamin D-3 25 MCG (1000 UT) Caps Take 1,000 Units by mouth daily.      Allergies  Allergen Reactions  . Cardura [Doxazosin Mesylate] Other (See Comments)    Hallucinations  . Tape     Paper tape      The results of significant diagnostics from this hospitalization (including imaging, microbiology, ancillary and laboratory) are listed below for reference.    Significant Diagnostic Studies: Dg Chest Port 1 View  Result Date: 04/27/2018 CLINICAL DATA:  Altered mental status, history of stroke EXAM: PORTABLE CHEST 1 VIEW COMPARISON:  02/10/2018 FINDINGS: There is no focal parenchymal opacity. There is no pleural effusion or pneumothorax. The heart and mediastinal contours are unremarkable. There is a dual lead cardiac pacemaker. The osseous structures are unremarkable. IMPRESSION: No active disease. Electronically Signed   By: Kathreen Devoid   On: 04/27/2018 13:45    Microbiology: Recent Results (from the past 240 hour(s))  MRSA PCR Screening     Status: None   Collection Time: 04/27/18  8:38 PM  Result Value Ref Range Status   MRSA by PCR NEGATIVE NEGATIVE Final    Comment:        The GeneXpert MRSA Assay (FDA approved for NASAL specimens only), is one component of a comprehensive MRSA colonization surveillance program. It is not intended to diagnose MRSA infection nor to guide or monitor treatment for MRSA infections. Performed at [facility], [place] [address] [place], [place] [address]      Labs: Basic Metabolic Panel: Recent Labs  Lab 04/27/18 1304 04/27/18 1953 04/29/18 1245 04/30/18 0510  NA 141  --  135 136  K 4.6  --  4.1 3.9  CL 100  --  97* 96*  CO2 24  --  25 27  GLUCOSE 138*  --  127* 101*  BUN 50*  --  34* 21  CREATININE 10.71* 11.61* 8.11* 5.57*  CALCIUM 8.6*  --  9.6 8.9  PHOS  --   --  6.6* 5.3*   Liver Function Tests: Recent Labs  Lab  04/27/18 1304 04/29/18 1245 04/30/18 0510  AST 17  --   --   ALT 12  --   --   ALKPHOS 48  --   --   BILITOT 0.4  --   --   PROT 5.7*  --   --   ALBUMIN 3.1* 3.3* 3.2*   No results for  input(s): LIPASE, AMYLASE in the last 168 hours. No results for input(s): AMMONIA in the last 168 hours. CBC: Recent Labs  Lab 04/27/18 1304 04/29/18 1246 04/30/18 0510  WBC 11.6* 8.5 8.3  NEUTROABS  --   --  4.3  HGB 8.7* 8.6* 8.9*  HCT 31.1* 29.5* 30.1*  MCV 94.5 91.0 89.9  PLT 232 213 220   Cardiac Enzymes: Recent Labs  Lab 04/27/18 1953 04/28/18 0048 04/28/18 0610 04/28/18 0800  CKTOTAL  --   --   --  121  CKMB  --   --   --  7.3*  TROPONINI 0.15* 0.14* 0.13*  --    BNP: BNP (last 3 results) Recent Labs    09/01/17 2101 02/10/18 0909 02/22/18 1627  BNP 104.8* 922.6* 266.0*    ProBNP (last 3 results) No results for input(s): PROBNP in the last 8760 hours.  CBG: Recent Labs  Lab 04/29/18 1127 04/29/18 1732 04/29/18 2106 04/30/18 0643 04/30/18 1201  GLUCAP 156* 76 115* 91 100*       Signed:  Nita Sells MD   [facility] 04/30/2018, 1:32 PM

## 2018-05-10 ENCOUNTER — Ambulatory Visit (INDEPENDENT_AMBULATORY_CARE_PROVIDER_SITE_OTHER): Payer: Medicare Other | Admitting: *Deleted

## 2018-05-10 DIAGNOSIS — I459 Conduction disorder, unspecified: Secondary | ICD-10-CM | POA: Diagnosis not present

## 2018-05-11 ENCOUNTER — Ambulatory Visit (HOSPITAL_BASED_OUTPATIENT_CLINIC_OR_DEPARTMENT_OTHER): Payer: Medicare Other | Attending: Internal Medicine | Admitting: Internal Medicine

## 2018-05-11 VITALS — Ht 64.0 in | Wt 195.0 lb

## 2018-05-11 DIAGNOSIS — R0683 Snoring: Secondary | ICD-10-CM | POA: Diagnosis not present

## 2018-05-11 DIAGNOSIS — G4733 Obstructive sleep apnea (adult) (pediatric): Secondary | ICD-10-CM

## 2018-05-11 DIAGNOSIS — G4710 Hypersomnia, unspecified: Secondary | ICD-10-CM | POA: Diagnosis not present

## 2018-05-11 LAB — CUP PACEART REMOTE DEVICE CHECK
Battery Remaining Longevity: 129 mo
Brady Statistic AP VP Percent: 0 %
Brady Statistic AP VS Percent: 0.01 %
Brady Statistic AS VP Percent: 0.05 %
Brady Statistic AS VS Percent: 99.94 %
Brady Statistic RA Percent Paced: 0.01 %
Brady Statistic RV Percent Paced: 0.05 %
Implantable Lead Implant Date: 20190219
Implantable Lead Implant Date: 20190219
Implantable Lead Location: 753859
Implantable Lead Location: 753860
Implantable Lead Model: 3830
Implantable Lead Model: 5076
Implantable Pulse Generator Implant Date: 20190219
Lead Channel Impedance Value: 285 Ohm
Lead Channel Impedance Value: 323 Ohm
Lead Channel Impedance Value: 399 Ohm
Lead Channel Impedance Value: 437 Ohm
Lead Channel Pacing Threshold Amplitude: 0.625 V
Lead Channel Pacing Threshold Amplitude: 1.125 V
Lead Channel Pacing Threshold Pulse Width: 0.4 ms
Lead Channel Pacing Threshold Pulse Width: 0.4 ms
Lead Channel Sensing Intrinsic Amplitude: 4.25 mV
Lead Channel Sensing Intrinsic Amplitude: 9.625 mV
Lead Channel Sensing Intrinsic Amplitude: 9.625 mV
Lead Channel Setting Pacing Amplitude: 2 V
Lead Channel Setting Pacing Amplitude: 2.5 V
Lead Channel Setting Pacing Pulse Width: 1 ms
Lead Channel Setting Sensing Sensitivity: 1.2 mV
MDC IDC MSMT BATTERY VOLTAGE: 2.99 V
MDC IDC MSMT LEADCHNL RA SENSING INTR AMPL: 4.25 mV
MDC IDC SESS DTM: 20200310044544

## 2018-05-18 ENCOUNTER — Encounter: Payer: Self-pay | Admitting: Cardiology

## 2018-05-18 NOTE — Progress Notes (Signed)
Remote pacemaker transmission.   

## 2018-05-21 NOTE — Procedures (Signed)
   NAME: Sean Best DATE OF BIRTH:  [birth year] MEDICAL RECORD NUMBER [id number]  LOCATION: [facility]  PHYSICIAN: Marius Ditch  DATE OF STUDY: 05/11/2018  SLEEP STUDY TYPE: Nocturnal Polysomnogram               REFERRING PHYSICIAN: Marius Ditch, MD  INDICATION FOR STUDY: Loud snoring, non-restorative sleep, daytime sleepiness  EPWORTH SLEEPINESS SCORE:  NA HEIGHT: 5\' 4"  (162.6 cm)  WEIGHT: 195 lb (88.5 kg)    Body mass index is 33.47 kg/m.  NECK SIZE: 17 in.  MEDICATIONS  Patient self administered medications include: [place], [place], BRILINTA. Medications administered during study include No sleep medicine administered.Marland Kitchen   SLEEP STUDY TECHNIQUE  A multi-channel overnight Polysomnography study was performed. The channels recorded and monitored were central and occipital EEG, electrooculogram (EOG), submentalis EMG (chin), nasal and oral airflow, thoracic and abdominal wall motion, anterior tibialis EMG, snore microphone, electrocardiogram, and a pulse oximetry.   TECHNICAL COMMENTS  Comments added by Technician: PATIENT WAS ORDERED AS A NPSG ONLY. Comments added by Scorer: N/A   SLEEP ARCHITECTURE  The study was initiated at 10:17:02 PM and terminated at 4:39:16 AM. The total recorded time was 382.2 minutes. EEG confirmed total sleep time was 205.5 minutes yielding a sleep efficiency of 53.8%. Sleep onset after lights out was 2.4 minutes. There was no REM sleep. The patient spent 45.26% of the night in stage N1 sleep, 54.74% in stage N2 sleep, 0.00% in stage N3 and 0% in REM. Wake after sleep onset (WASO) was 174.3 minutes. The Arousal Index was 95.8/hour.  RESPIRATORY PARAMETERS  There were a total of 215 respiratory disturbances out of which 153 were apneas (148 obstructive, 0 mixed, 5 central) and 62 hypopneas. The apnea/hypopnea index (AHI) was 62.8 events/hour. The central sleep apnea index was 1.5 events/hour. The supine AHI was 57.1  events/hour. Respiratory disturbances were associated with oxygen desaturation down to a nadir of 74.00% during sleep. The mean oxygen saturation during the study was 91.38%. The cumulative time under 88% oxygen saturation was 40.6 minutes.  LEG MOVEMENT DATA  The total leg movements were with a resulting leg movement index of /hr . Associated arousal with leg movement index was 7.0/hr.   CARDIAC DATA  The underlying cardiac rhythm was most consistent with sinus rhythm. Mean heart rate during sleep was 94.66 bpm. Additional rhythm abnormalities include PVCs.   IMPRESSIONS  Severe Obstructive Sleep Apnea (OSA) Severe Hypoxemia.  Moderate periodic leg movements(PLMs) during sleep. Associated arousals were significant with an arousal index of 7.0 /hour.  DIAGNOSIS  Obstructive Sleep Apnea (327.23 [G47.33 ICD-10]  RECOMMENDATIONS  Given patient's age and co-morbidities, therapeutic CPAP titration to determine optimal pressure required to alleviate sleep disordered breathing.   Marius Ditch Sleep specialist, [place] Board of Internal Medicine  ELECTRONICALLY SIGNED ON:  05/21/2018, 8:27 PM [place] PH: [phone]   FX: [phone] [place]

## 2018-05-26 ENCOUNTER — Other Ambulatory Visit: Payer: Self-pay

## 2018-05-26 ENCOUNTER — Encounter: Payer: Self-pay | Admitting: Physician Assistant

## 2018-05-26 ENCOUNTER — Ambulatory Visit (INDEPENDENT_AMBULATORY_CARE_PROVIDER_SITE_OTHER): Payer: Medicare Other | Admitting: Physician Assistant

## 2018-05-26 VITALS — BP 160/90 | HR 92 | Ht 65.0 in | Wt 193.0 lb

## 2018-05-26 DIAGNOSIS — Z950 Presence of cardiac pacemaker: Secondary | ICD-10-CM | POA: Diagnosis not present

## 2018-05-26 DIAGNOSIS — R55 Syncope and collapse: Secondary | ICD-10-CM

## 2018-05-26 DIAGNOSIS — I2510 Atherosclerotic heart disease of native coronary artery without angina pectoris: Secondary | ICD-10-CM

## 2018-05-26 DIAGNOSIS — N186 End stage renal disease: Secondary | ICD-10-CM

## 2018-05-26 DIAGNOSIS — I10 Essential (primary) hypertension: Secondary | ICD-10-CM

## 2018-05-26 DIAGNOSIS — E119 Type 2 diabetes mellitus without complications: Secondary | ICD-10-CM

## 2018-05-26 DIAGNOSIS — Z992 Dependence on renal dialysis: Secondary | ICD-10-CM

## 2018-05-26 DIAGNOSIS — I5032 Chronic diastolic (congestive) heart failure: Secondary | ICD-10-CM

## 2018-05-26 DIAGNOSIS — Z8673 Personal history of transient ischemic attack (TIA), and cerebral infarction without residual deficits: Secondary | ICD-10-CM

## 2018-05-26 NOTE — Progress Notes (Signed)
Cardiology Office Note    Date:  05/26/2018   ID:  Sean Best, DOB November 02, 1933, MRN [id number]  PCP:  Seward Carol, MD  Cardiologist:   Pixie Casino, MD EP: Cristopher Peru, MD  Nephrology: Dr. Patel/Dr. Melvia Heaps  No chief complaint on file.   History of Present Illness:  Sean Best is a 85 y.o. male with PMH of who presents for cardiology visit.  He has a history of CHB s/p MDT PPM, ESRD on HD MWF, HTN, DM, chronic diastolic heart failure, prostate cancer, and history of CVA.  He was recently admitted to the hospital from 02/10/18-02/13/18 for the evaluation of chest pain and SOB. He was found to have pulmonary edema due to missed dialysis session, with improvement in SOB with HD. Chest pain persisted despite volume removal and troponins were elevated to peak 0.13. He was found ot have a new LBBB on EKG and decision was made to undergo a LHC which revealed severe single vessel CAD in the circumflex/OM branch managed with PCI/DES x2. Echo showed EF 60-65%, G2DD, no wall motion abnormalities, moderate LVH, and mild-moderate AS.   I last saw the patient on 03/08/2018, he continued to have intermittent chest discomfort 4 out of every 7 days.  I increase his Imdur to 60 mg daily on nondialysis days and obtain a stress test on 1/14 which came back negative.  Patient was recently admitted near the end of February to the hospital again with presyncopal episode at the beginning of dialysis.  Nephrology saw the patient felt he was not volume depleted and may be volume overloaded instead.  To the admission, he had a several days of diarrhea started on 2/22 and ended on 2/24.  His orthostatic vital signs was negative.  He did have episodes of SVT.  Pacemaker was interrogated in the hospital by Dr. Sallyanne Kuster which did not show any new findings, less than 1% burden of any SVT.  Obtained on 04/29/2018 showed EF 60 to 65%, severe LAE, mild to moderate aortic stenosis.  He was instructed to follow-up with  cardiology service as outpatient.  Patient presents today for cardiology office visit.  Since the recent discharge, he has not had any further dizzy spell.  His blood pressure does drop during dialysis quite significantly.  I asked him to hold his blood pressure medication on dialysis days.  Otherwise he denies any obvious chest discomfort or shortness of breath.  With the recent normal Myoview, suspicion for ischemia relatively low.  I will continue him on the current medication.  Otherwise, he appears to be euvolemic with only trace amount of ankle edema.  Past Medical History:  Diagnosis Date   Anemia    Arthritis    "knees" (04/11/2016)   Blind left eye    S/P trauma   Chronic total occlusion of artery of the extremities [facility])    pt not aware of this   Claudication [facility])    ESRD on dialysis [facility])    "MWF; [name]" ((04/10/2016)   ESRD on peritoneal dialysis [facility])    Started dialysis around April 2015 per son.  Has been doing peritoneal dialysis at home.      Gout    Hyperlipidemia    Hypertension    Overweight(278.02)    Presence of permanent cardiac pacemaker    medtronic   Prostate cancer ([place]) 1990s   Shingles    Stroke [facility]) ~1998   denies residual on 04/11/2016   Type II diabetes mellitus ([place])  diet controlled   Umbilical hernia 08/31/7791    Past Surgical History:  Procedure Laterality Date   AV FISTULA PLACEMENT, RADIOCEPHALIC  90/30/0923   Left arm   CAPD INSERTION N/A 03/06/2013   Procedure: LAPAROSCOPIC INSERTION CONTINUOUS AMBULATORY PERITONEAL DIALYSIS  (CAPD) CATHETER;  Surgeon: Edward Jolly, MD;  Location: [place];  Service: General;  Laterality: N/A;   CATARACT EXTRACTION Right    CHOLECYSTECTOMY  12/02/2017   LAPROSCOPIC   CORONARY STENT INTERVENTION N/A 02/11/2018   Procedure: CORONARY STENT INTERVENTION;  Surgeon: Sherren Mocha, MD;  Location: [place] CV LAB;  Service: Cardiovascular;  Laterality: N/A;   EYE SURGERY  Left 1970s   for eye injury   [place] WITH INTRAOPERATIVE CHOLANGIOGRAM N/A 12/02/2017   Procedure: LAPAROSCOPIC CHOLECYSTECTOMY;  Surgeon: Michael Boston, MD;  Location: [place];  Service: General;  Laterality: N/A;   LEFT HEART CATH AND CORONARY ANGIOGRAPHY N/A 02/11/2018   Procedure: LEFT HEART CATH AND CORONARY ANGIOGRAPHY;  Surgeon: Sherren Mocha, MD;  Location: [place] CV LAB;  Service: Cardiovascular;  Laterality: N/A;   PACEMAKER IMPLANT N/A 04/20/2017   Procedure: PACEMAKER IMPLANT;  Surgeon: Evans Lance, MD;  Location: [place] CV LAB;  Service: Cardiovascular;  Laterality: N/A;   UMBILICAL HERNIA REPAIR N/A 03/06/2013   Procedure: HERNIA REPAIR UMBILICAL WITH MESH;  Surgeon: Edward Jolly, MD;  Location: [facility];  Service: General;  Laterality: N/A;    Current Medications: Outpatient Medications Prior to Visit  Medication Sig Dispense Refill   allopurinol (ZYLOPRIM) 100 MG tablet Take 100 mg by mouth daily.       amLODipine (NORVASC) 10 MG tablet Take 10 mg by mouth daily.     aspirin 81 MG chewable tablet Chew 1 tablet (81 mg total) by mouth daily. 60 tablet 2   atorvastatin (LIPITOR) 40 MG tablet Take 40 mg by mouth every evening.   3   calcium acetate (PHOSLO) 667 MG capsule Take 3 capsules (2,001 mg total) by mouth 3 (three) times daily with meals. 90 capsule 0   Cholecalciferol (VITAMIN D-3) 1000 units CAPS Take 1,000 Units by mouth daily.     Cinacalcet HCl (SENSIPAR PO) Take by mouth every Monday, Wednesday, and Friday.     escitalopram (LEXAPRO) 10 MG tablet Take 10 mg by mouth daily.     ferrous sulfate 325 (65 FE) MG EC tablet Take 325 mg by mouth 2 (two) times daily with a meal.      gabapentin (NEURONTIN) 100 MG capsule Take 100 mg by mouth at bedtime.  10   isosorbide mononitrate (IMDUR) 30 MG 24 hr tablet Take 1 tablet (30 mg total) by mouth daily.  (Patient taking differently: Take 30 mg by mouth 2 (two) times daily. Taking on Tues, Thurs, Sat and Sunday) 60 tablet 0   lidocaine-prilocaine (EMLA) cream Apply 1 application topically as needed (for port access).   11   methocarbamol (ROBAXIN) 500 MG tablet Take 500 mg by mouth 3 (three) times daily as needed.     multivitamin (RENA-VIT) TABS tablet Take 1 tablet by mouth daily.     ticagrelor (BRILINTA) 90 MG TABS tablet Take 1 tablet (90 mg total) by mouth 2 (two) times daily. 60 tablet 3   VELPHORO 500 MG chewable tablet Chew 500 mg by mouth 3 (three) times daily as needed (to break down phosphoru--swhen not taking the phoslo).  3   albuterol (PROVENTIL) (2.5 MG/3ML) 0.083% nebulizer solution Take 3 mLs (2.5 mg total) by nebulization every 6 (six) hours as needed for wheezing or shortness of breath. 75 mL 12   amLODipine (NORVASC) 5 MG tablet Take 1 tablet (5 mg total) by mouth at bedtime.     nitroGLYCERIN (NITROSTAT) 0.4 MG SL tablet Place 1 tablet (0.4 mg total) under the tongue every 5 (five) minutes as needed for up to 25 days for chest pain. 25 tablet 3   pantoprazole (PROTONIX) 40 MG tablet Take 1 tablet (40 mg total) by mouth 2 (two) times daily. (Patient taking differently: Take 40 mg by mouth daily. ) 60 tablet 0   No facility-administered medications prior to visit.      Allergies:   Cardura [doxazosin mesylate] and Tape   Social History   Socioeconomic History   Marital status: Widowed    Spouse name: Not on file   Number of children: Not on file   Years of education: Not on file   Highest education level: Not on file  Occupational History   Occupation: Retired  Scientist, product/process development strain: Not on file   Food insecurity:    Worry: Not on file    Inability: Not on Lexicographer needs:    Medical: Not on file    Non-medical: Not on file  Tobacco Use   Smoking status: Never Smoker   Smokeless tobacco: Never Used  Substance  and Sexual Activity   Alcohol use: No   Drug use: No   Sexual activity: Not Currently  Lifestyle   Physical activity:    Days per week: Not on file    Minutes per session: Not on file   Stress: Not on file  Relationships   Social connections:    Talks on phone: Not on file    Gets together: Not on file    Attends religious service: Not on file    Active member of club or organization: Not on file    Attends meetings of clubs or organizations: Not on file    Relationship status: Not on file  Other Topics Concern   Not on file  Social History Narrative   Patient lives with his daughter.     Family History:  The patient's family history includes Cancer in his father and mother; Heart disease in his mother.   ROS:   Please see the history of present illness.    ROS All other systems reviewed and are negative.   PHYSICAL EXAM:   VS:  BP (!) 160/90    Pulse 92    Ht 5\' 5"  (1.651 m)    Wt 193 lb (87.5 kg)    BMI 32.12 kg/m    GEN: Well nourished, well developed, in no acute distress  HEENT: normal  Neck: no JVD, carotid bruits, or masses Cardiac: RRR; no murmurs, rubs, or gallops. Trace ankle edema  Respiratory:  clear to auscultation bilaterally, normal work of breathing GI: soft, nontender, nondistended, + BS MS: no deformity or atrophy  Skin: warm and dry, no rash.  Left arm AV fistula Neuro:  Alert and Oriented x 3, Strength and sensation are intact Psych: euthymic mood, full affect  Wt Readings from Last 3 Encounters:  05/26/18 193 lb (87.5 kg)  05/11/18 195 lb (88.5 kg)  04/30/18 195 lb 8 oz (88.7 kg)      Studies/Labs Reviewed:   EKG:  EKG is not ordered today.  Recent Labs: 02/22/2018: B Natriuretic Peptide 266.0 04/27/2018: ALT 12 04/30/2018: BUN 21; Creatinine, Ser 5.57; Hemoglobin 8.9; Platelets 220; Potassium 3.9; Sodium 136   Lipid Panel    Component Value Date/Time   CHOL 96 12/10/2013 2045   TRIG 190 (H) 12/10/2013 2045   HDL 22 (L)  12/10/2013 2045   CHOLHDL 4.4 12/10/2013 2045   VLDL 38 12/10/2013 2045   LDLCALC 36 12/10/2013 2045    Additional studies/ records that were reviewed today include:   [place] 03/15/2018 Study Highlights     The left ventricular ejection fraction is mildly decreased (45-54%).  Nuclear stress EF: 46%.  There was no ST segment deviation noted during stress.  The study is normal.  This is a low risk study.   Normal pharmacologic nuclear stress test with no evidence for prior infarct or ischemia. LVEF calculated at 46% but visually appears better. Correlation with an echocardiogram is recommended.     Echo 04/29/2018 1. The left ventricle has normal systolic function with an ejection fraction of 60-65%. The cavity size was normal. There is severely increased left ventricular wall thickness. Left ventricular diastolic Doppler parameters are indeterminate.  2. The right ventricle has normal systolic function. The cavity was normal. There is mildly increased right ventricular wall thickness.  3. Left atrial size was severely dilated.  4. Right atrial size was mildly dilated.  5. The mitral valve is degenerative. Mild calcification of the mitral valve leaflet. There is moderate mitral annular calcification present.  6. The tricuspid valve is normal in structure.  7. The aortic valve is tricuspid Moderate calcification of the aortic valve. Aortic valve regurgitation is trivial by color flow Doppler. mild-moderate stenosis of the aortic valve.  8. The aortic root and ascending aorta are normal in size and structure.  9. The inferior vena cava was normal in size with <50% respiratory variability. 10. When compared to the prior study: 01/2018 - LVEF 60-65%, mild to moderate aortic stenosis.  SUMMARY   LVEF 60-65%, severe LVH, normal wall motion, severe LAE, mild RAE, calcified mitral valve with mild regurgitation, mild to moderate aortic stenosis (mean gradient 12 mmHg), no significant  TR, IVC suggests RA pressure of 8 mmHg  ASSESSMENT:    1. Pre-syncope   2. Coronary artery disease involving native coronary artery of native heart without angina pectoris   3. Pacemaker   4. ESRD (end stage renal disease) on dialysis ([place])   5. Essential hypertension   6. Controlled type 2 diabetes mellitus without complication, without long-term current use of insulin ([place])   7. Chronic diastolic (congestive) heart failure (H[facility])   8. H/O: CVA (cerebrovascular accident)      PLAN:  In order of problems listed above:  1. Presyncope  -No recurrent symptoms since the hospitalization.  Patient has significant drop in the blood pressure during dialysis.  Recommend for him to hold blood pressure medication on dialysis days.  2. CAD: DES x2 in December 2019.  Continue aspirin and Brilinta.  Advised the patient that Brilinta therapy would need to be continued for at least 12 months.  Due to recurrent chest pain, I obtained a Myoview in January which came back negative.  3. CHB s/p Medtronic PPM: Interrogated recently after admitted for presyncope.  No significant arrhythmia  4. End-stage renal disease on dialysis: Monday, Wednesday and Friday.  5. Hypertension: Blood pressure is elevated today, advised the patient to take the blood pressure medication on nondialysis days and hold BP medication on dialysis  days  6. DM2: Managed by primary care provider  7. Chronic diastolic heart failure: Managed through dialysis.  Appears to be euvolemic on physical exam  8. History of CVA: No recurrence    Medication Adjustments/Labs and Tests Ordered: Current medicines are reviewed at length with the patient today.  Concerns regarding medicines are outlined above.  Medication changes, Labs and Tests ordered today are listed in the Patient Instructions below. Patient Instructions  Medication Instructions:  HOLD your blood pressure medications on DIALYSIS days:  Amlodipine (Norvasc) Isosorbide  mononitrate (Imdur)  Your physician recommends that you continue on your current medications as directed. Please refer to the Current Medication list given to you today.  If you need a refill on your cardiac medications before your next appointment, please call your pharmacy.   Lab work:  NONE  If you have labs (blood work) drawn today and your tests are completely normal, you will receive your results only by:  [place] (if you have MyChart) OR  A paper copy in the mail If you have any lab test that is abnormal or we need to change your treatment, we will call you to review the results.  Testing/Procedures:  NONE  Follow-Up: At [facility], you and your health needs are our priority.  As part of our continuing mission to provide you with exceptional heart care, we have created designated Provider Care Teams.  These Care Teams include your primary Cardiologist (physician) and Advanced Practice Providers (APPs -  Physician Assistants and Nurse Practitioners) who all work together to provide you with the care you need, when you need it. You will need a follow up appointment in 3-4 months.  Please call our office 2 months in advance to schedule this appointment.  You may see Pixie Casino, MD or one of the following Advanced Practice Providers on your designated Care Team: Almyra Deforest, [place]  Fabian Sharp, PA-C  Any Other Special Instructions Will Be Listed Below (If Applicable).        Hilbert Corrigan, [place]  05/26/2018 1:18 PM    [facility] [address], [place], [place]  [address] Phone: [phone]; Fax: [phone]

## 2018-05-26 NOTE — Patient Instructions (Addendum)
Medication Instructions:  HOLD your blood pressure medications on DIALYSIS days:  Amlodipine (Norvasc) Isosorbide mononitrate (Imdur)  Your physician recommends that you continue on your current medications as directed. Please refer to the Current Medication list given to you today.  If you need a refill on your cardiac medications before your next appointment, please call your pharmacy.   Lab work:  NONE  If you have labs (blood work) drawn today and your tests are completely normal, you will receive your results only by: [place] MyChart Message (if you have MyChart) OR . A paper copy in the mail If you have any lab test that is abnormal or we need to change your treatment, we will call you to review the results.  Testing/Procedures:  NONE  Follow-Up: At [facility], you and your health needs are our priority.  As part of our continuing mission to provide you with exceptional heart care, we have created designated Provider Care Teams.  These Care Teams include your primary Cardiologist (physician) and Advanced Practice Providers (APPs -  Physician Assistants and Nurse Practitioners) who all work together to provide you with the care you need, when you need it. You will need a follow up appointment in 3-4 months.  Please call our office 2 months in advance to schedule this appointment.  You may see Pixie Casino, MD or one of the following Advanced Practice Providers on your designated Care Team: [place], [place] . Fabian Sharp, PA-C  Any Other Special Instructions Will Be Listed Below (If Applicable).

## 2018-06-10 DIAGNOSIS — R519 Headache, unspecified: Secondary | ICD-10-CM | POA: Insufficient documentation

## 2018-06-17 ENCOUNTER — Other Ambulatory Visit: Payer: Self-pay | Admitting: Physician Assistant

## 2018-06-23 ENCOUNTER — Ambulatory Visit (INDEPENDENT_AMBULATORY_CARE_PROVIDER_SITE_OTHER): Payer: Medicare Other | Admitting: Orthopaedic Surgery

## 2018-06-28 ENCOUNTER — Ambulatory Visit (INDEPENDENT_AMBULATORY_CARE_PROVIDER_SITE_OTHER): Payer: Self-pay

## 2018-06-28 ENCOUNTER — Other Ambulatory Visit: Payer: Self-pay

## 2018-06-28 ENCOUNTER — Ambulatory Visit (INDEPENDENT_AMBULATORY_CARE_PROVIDER_SITE_OTHER): Payer: Medicare Other | Admitting: Orthopaedic Surgery

## 2018-06-28 ENCOUNTER — Encounter (INDEPENDENT_AMBULATORY_CARE_PROVIDER_SITE_OTHER): Payer: Self-pay | Admitting: Orthopaedic Surgery

## 2018-06-28 VITALS — BP 178/108 | HR 98 | Ht 65.0 in | Wt 192.0 lb

## 2018-06-28 DIAGNOSIS — G8929 Other chronic pain: Secondary | ICD-10-CM

## 2018-06-28 DIAGNOSIS — M25562 Pain in left knee: Secondary | ICD-10-CM

## 2018-06-28 DIAGNOSIS — M1712 Unilateral primary osteoarthritis, left knee: Secondary | ICD-10-CM | POA: Diagnosis not present

## 2018-06-28 DIAGNOSIS — M25561 Pain in right knee: Secondary | ICD-10-CM

## 2018-06-28 DIAGNOSIS — M1711 Unilateral primary osteoarthritis, right knee: Secondary | ICD-10-CM

## 2018-06-28 MED ORDER — BUPIVACAINE HCL 0.25 % IJ SOLN
2.0000 mL | INTRAMUSCULAR | Status: AC | PRN
  Administered 2018-06-28: 12:00:00 2 mL via INTRA_ARTICULAR

## 2018-06-28 MED ORDER — LIDOCAINE HCL 1 % IJ SOLN
2.0000 mL | INTRAMUSCULAR | Status: AC | PRN
  Administered 2018-06-28: 2 mL

## 2018-06-28 MED ORDER — METHYLPREDNISOLONE ACETATE 40 MG/ML IJ SUSP
80.0000 mg | INTRAMUSCULAR | Status: AC | PRN
  Administered 2018-06-28: 12:00:00 80 mg via INTRA_ARTICULAR

## 2018-06-28 NOTE — Progress Notes (Signed)
Office Visit Note   Patient: Sean Best           Date of Birth: 06/08/1933           MRN: [id number] Visit Date: 06/28/2018              Requested by: Seward Carol, MD [address] [place], [place] [address] PCP: Seward Carol, MD   Assessment & Plan: Visit Diagnoses:  1. Unilateral primary osteoarthritis, right knee   2. Unilateral primary osteoarthritis, left knee   3. Chronic pain of right knee   4. Chronic pain of left knee     Plan:  #1: Aspiration of the right knee was accomplished by Dr. Durward Fortes and a injection of corticosteroid was placed #2: We will send fluid off for cell count and differential. #3: Follow back up in 2 weeks for aspiration injection of the left knee.  Follow-Up Instructions: Return in about 2 weeks (around 07/12/2018).   Orders:  Orders Placed This Encounter  Procedures  . Large Joint Inj  . XR KNEE 3 VIEW LEFT  . XR KNEE 3 VIEW RIGHT  . Synovial cell count + diff, w/ crystals   No orders of the defined types were placed in this encounter.     Procedures: Large Joint Inj: R knee on 06/28/2018 11:55 AM Indications: pain and diagnostic evaluation Details: 18 G 1.5 in needle, anteromedial approach  Arthrogram: No  Medications: 2 mL lidocaine 1 %; 80 mg methylPREDNISolone acetate 40 MG/ML; 2 mL bupivacaine 0.25 % Aspirate: 40 mL yellow (Slight brown ); sent for lab analysis Outcome: tolerated well, no immediate complications Procedure, treatment alternatives, risks and benefits explained, specific risks discussed. Consent was given by the patient. Immediately prior to procedure a time out was called to verify the correct patient, procedure, equipment, support staff and site/side marked as required. Patient was prepped and draped in the usual sterile fashion.       Clinical Data: No additional findings.   Subjective: Chief Complaint  Patient presents with  . Right Knee - Pain   HPI Patient presents today for  right greater than left knee pain. Patient states that they have both been hurting for about two months. No known injury. He said that both hurt about the same and on the anterior side. He said that his knees hurt more upon getting up, but get better after moving. His right knee does swell sometimes. He is taking tylenol as needed. Patient states that he received cortisone in his right knee about 6 months ago and that helped for two months.    Review of Systems  Constitutional: Positive for fatigue.  HENT: Negative for ear pain.   Eyes: Negative for pain.  Respiratory: Positive for shortness of breath.   Cardiovascular: Negative for leg swelling.  Gastrointestinal: Positive for diarrhea. Negative for constipation.  Endocrine: Positive for cold intolerance. Negative for heat intolerance.  Genitourinary: Positive for difficulty urinating.  Musculoskeletal: Negative for joint swelling.  Skin: Negative for rash.  Allergic/Immunologic: Negative for food allergies.  Neurological: Negative for weakness.  Hematological: Does not bruise/bleed easily.  Psychiatric/Behavioral: Positive for sleep disturbance.     Objective: Vital Signs: BP (!) 178/108   Pulse 98   Ht 5\' 5"  (1.651 m)   Wt 192 lb (87.1 kg)   BMI 31.95 kg/m   Physical Exam Constitutional:      Appearance: He is obese.  HENT:     Head: Normocephalic.  Eyes:  Pupils: Pupils are equal, round, and reactive to light.  Pulmonary:     Effort: Pulmonary effort is normal.  Skin:    General: Skin is warm and dry.  Neurological:     General: No focal deficit present.     Mental Status: He is alert and oriented to person, place, and time.  Psychiatric:        Mood and Affect: Mood normal.        Behavior: Behavior normal.        Thought Content: Thought content normal.        Judgment: Judgment normal.     Ortho Exam  Today the right knee has range of motion from about 5 to 7 degrees to about 100 degrees.  I have varus  positioning of the knee.  Can barely get it to neutral.  Has a mild effusion without warmth erythema.  He does have some healing appears to be some scratch marks over the medial aspect of the proximal tibia and knee.  Left knee reveals range of motion 5 degrees to 70 degrees to that about 110 degrees.  Again varus positioning of the knee.  Barely can get to neutral.  Mild effusion without warmth or erythema.  Specialty Comments:  No specialty comments available.  Imaging: Xr Knee 3 View Left  Result Date: 06/28/2018 Three-view x-ray of the left knee reveals tricompartment osteoarthritis worse in the medial compartment where its bone-on-bone.  Are sclerosing of both the distal medial femoral condyle and proximal tibial plateau medially.  Marked cystic formations in both of those also.  Sits in a valgus position on x-ray.  Calcification diffusely about the artery and popliteal artery.  Spurring in the patellofemoral joint also noted.  Xr Knee 3 View Right  Result Date: 06/28/2018 Three-view x-ray of the right knee reveals marked degenerative joint disease with no joint space noted on the medial aspect.  Marked cystic changes and sclerosing over the medial femoral condyle and proximal medial tibial plateau.  Large cyst in the proximal tibial plateau is noted.  Translation medially of the distal femur on the tibia.  Periarticular spurring throughout.  Calcification in the distal femoral artery as well in the popliteal artery.  Marked patellofemoral degenerative changes noted with lateral positioning of the patella with decreased joint space.  Marked cystic changes in the distal femur more medially than laterally on the sunrise view.    PMFS History: Current Outpatient Medications  Medication Sig Dispense Refill  . allopurinol (ZYLOPRIM) 100 MG tablet Take 100 mg by mouth daily.      Marland Kitchen amLODipine (NORVASC) 10 MG tablet Take 10 mg by mouth daily.    Marland Kitchen aspirin 81 MG chewable tablet Chew 1 tablet (81  mg total) by mouth daily. 60 tablet 2  . atorvastatin (LIPITOR) 40 MG tablet Take 40 mg by mouth every evening.   3  . calcium acetate (PHOSLO) 667 MG capsule Take 3 capsules (2,001 mg total) by mouth 3 (three) times daily with meals. 90 capsule 0  . Cholecalciferol (VITAMIN D-3) 1000 units CAPS Take 1,000 Units by mouth daily.    . Cinacalcet HCl (SENSIPAR PO) Take by mouth every Monday, Wednesday, and Friday.    . escitalopram (LEXAPRO) 10 MG tablet Take 10 mg by mouth daily.    . ferrous sulfate 325 (65 FE) MG EC tablet Take 325 mg by mouth 2 (two) times daily with a meal.     . gabapentin (NEURONTIN) 100 MG capsule Take 100  mg by mouth at bedtime.  10  . isosorbide mononitrate (IMDUR) 30 MG 24 hr tablet Take 1 tablet (30 mg total) by mouth daily. (Patient taking differently: Take 30 mg by mouth 2 (two) times daily. Taking on Tues, Thurs, Sat and Sunday) 60 tablet 0  . lidocaine-prilocaine (EMLA) cream Apply 1 application topically as needed (for port access).   11  . methocarbamol (ROBAXIN) 500 MG tablet Take 500 mg by mouth 3 (three) times daily as needed.    . multivitamin (RENA-VIT) TABS tablet Take 1 tablet by mouth daily.    . nitroGLYCERIN (NITROSTAT) 0.4 MG SL tablet PLEASE SEE ATTACHED FOR DETAILED DIRECTIONS 25 tablet 1  . ticagrelor (BRILINTA) 90 MG TABS tablet Take 1 tablet (90 mg total) by mouth 2 (two) times daily. 60 tablet 3  . VELPHORO 500 MG chewable tablet Chew 500 mg by mouth 3 (three) times daily as needed (to break down phosphoru--swhen not taking the phoslo).   3  . pantoprazole (PROTONIX) 40 MG tablet Take 1 tablet (40 mg total) by mouth 2 (two) times daily. (Patient taking differently: Take 40 mg by mouth daily. ) 60 tablet 0   No current facility-administered medications for this visit.     Patient Active Problem List   Diagnosis Date Noted  . Left bundle branch block (LBBB) determined by electrocardiography 02/13/2018  . Aortic stenosis 02/13/2018  . NSTEMI  (non-ST elevated myocardial infarction) ([place]) 02/13/2018  . Constipation 12/08/2017  . Abdominal pain 12/06/2017  . Leukocytosis 12/06/2017  . Gout 12/06/2017  . Chronic pain 12/06/2017  . Acute on chronic cholecystitis s/p lap cholecystectomy 12/02/2017 12/02/2017  . Cardiac pacemaker in situ (Medtronic DDD) 12/02/2017  . Acute hypoxemic respiratory failure ([place]) 04/18/2017  . Heart block 04/17/2017  . Elevated troponin 04/17/2017  . ESRD on hemodialysis ([place]) 04/12/2016  . H/O: stroke 04/11/2016  . Saccular aneurysm of infrarenal aorta with thrombosed dissection of abdominal aorta 04/11/2016  . Obesity (BMI 30-39.9) 04/11/2016  . Syncope 04/11/2016  . Prostate CA ([place]) 12/28/2014  . Chest pain 12/28/2014  . C. difficile colitis 10/04/2014  . Hyperkalemia 03/11/2013  . Anemia 03/11/2013  . DM (diabetes mellitus), type 2 with renal complications, diet controlled 03/11/2013  . HTN (hypertension) 03/11/2013  . Hyperlipidemia 03/11/2013  . ESRD on dialysis [facility]) 03/20/2011   Past Medical History:  Diagnosis Date  . Anemia   . Arthritis    "knees" (04/11/2016)  . Blind left eye    S/P trauma  . Chronic total occlusion of artery of the extremities (HCC)    pt not aware of this  . Claudication ([place])   . ESRD on dialysis [facility])    "MWF; [name]" ((04/10/2016)  . ESRD on peritoneal dialysis [facility])    Started dialysis around April 2015 per son.  Has been doing peritoneal dialysis at home.     . Gout   . Hyperlipidemia   . Hypertension   . Overweight(278.02)   . Presence of permanent cardiac pacemaker    medtronic  . Prostate cancer ([place]) 1990s  . Shingles   . Stroke [facility]) ~1998   denies residual on 04/11/2016  . Type II diabetes mellitus (HCC)    diet controlled  . Umbilical hernia 0/12/2723    Family History  Problem Relation Age of Onset  . Cancer Mother   . Heart disease Mother   . Cancer Father     Past Surgical History:  Procedure Laterality Date  . AV FISTULA  PLACEMENT, RADIOCEPHALIC  37/90/2409   Left arm  . CAPD INSERTION N/A 03/06/2013   Procedure: LAPAROSCOPIC INSERTION CONTINUOUS AMBULATORY PERITONEAL DIALYSIS  (CAPD) CATHETER;  Surgeon: Edward Jolly, MD;  Location: [place];  Service: General;  Laterality: N/A;  . CATARACT EXTRACTION Right   . CHOLECYSTECTOMY  12/02/2017   LAPROSCOPIC  . CORONARY STENT INTERVENTION N/A 02/11/2018   Procedure: CORONARY STENT INTERVENTION;  Surgeon: Sherren Mocha, MD;  Location: [place] CV LAB;  Service: Cardiovascular;  Laterality: N/A;  . EYE SURGERY Left 1970s   for eye injury  . HERNIA REPAIR    . INSERTION PROSTATE RADIATION SEED    . LAPAROSCOPIC CHOLECYSTECTOMY SINGLE SITE WITH INTRAOPERATIVE CHOLANGIOGRAM N/A 12/02/2017   Procedure: LAPAROSCOPIC CHOLECYSTECTOMY;  Surgeon: Michael Boston, MD;  Location: [place];  Service: General;  Laterality: N/A;  . LEFT HEART CATH AND CORONARY ANGIOGRAPHY N/A 02/11/2018   Procedure: LEFT HEART CATH AND CORONARY ANGIOGRAPHY;  Surgeon: Sherren Mocha, MD;  Location: [place] CV LAB;  Service: Cardiovascular;  Laterality: N/A;  . PACEMAKER IMPLANT N/A 04/20/2017   Procedure: PACEMAKER IMPLANT;  Surgeon: Evans Lance, MD;  Location: [place] CV LAB;  Service: Cardiovascular;  Laterality: N/A;  . UMBILICAL HERNIA REPAIR N/A 03/06/2013   Procedure: HERNIA REPAIR UMBILICAL WITH MESH;  Surgeon: Edward Jolly, MD;  Location: [facility];  Service: General;  Laterality: N/A;   Social History   Occupational History  . Occupation: Retired  Tobacco Use  . Smoking status: Never Smoker  . Smokeless tobacco: Never Used  Substance and Sexual Activity  . Alcohol use: No  . Drug use: No  . Sexual activity: Not Currently

## 2018-07-06 DIAGNOSIS — R5383 Other fatigue: Secondary | ICD-10-CM | POA: Insufficient documentation

## 2018-07-07 ENCOUNTER — Telehealth: Payer: Self-pay | Admitting: Internal Medicine

## 2018-07-07 NOTE — Telephone Encounter (Signed)
New message   Spoke w/ pt daughter and scheduled video visit through doxemity with Dr. Lovena Le.      Virtual Visit Pre-Appointment Phone Call  "(Name), I am calling you today to discuss your upcoming appointment. We are currently trying to limit exposure to the virus that causes COVID-19 by seeing patients at home rather than in the office."  1. "What is the BEST phone number to call the day of the visit?" - include this in appointment notes  2. Do you have or have access to (through a family member/friend) a smartphone with video capability that we can use for your visit?" a. If yes - list this number in appt notes as cell (if different from BEST phone #) and list the appointment type as a VIDEO visit in appointment notes b. If no - list the appointment type as a PHONE visit in appointment notes  3. Confirm consent - "In the setting of the current Covid19 crisis, you are scheduled for a (phone or video) visit with your provider on (date) at (time).  Just as we do with many in-office visits, in order for you to participate in this visit, we must obtain consent.  If you'd like, I can send this to your mychart (if signed up) or email for you to review.  Otherwise, I can obtain your verbal consent now.  All virtual visits are billed to your insurance company just like a normal visit would be.  By agreeing to a virtual visit, we'd like you to understand that the technology does not allow for your provider to perform an examination, and thus may limit your provider's ability to fully assess your condition. If your provider identifies any concerns that need to be evaluated in person, we will make arrangements to do so.  Finally, though the technology is pretty good, we cannot assure that it will always work on either your or our end, and in the setting of a video visit, we may have to convert it to a phone-only visit.  In either situation, we cannot ensure that we have a secure connection.  Are you  willing to proceed?" STAFF: Did the patient verbally acknowledge consent to telehealth visit? Document YES/NO here: YES  4. Advise patient to be prepared - "Two hours prior to your appointment, go ahead and check your blood pressure, pulse, oxygen saturation, and your weight (if you have the equipment to check those) and write them all down. When your visit starts, your provider will ask you for this information. If you have an Apple Watch or Kardia device, please plan to have heart rate information ready on the day of your appointment. Please have a pen and paper handy nearby the day of the visit as well."  5. Give patient instructions for MyChart download to smartphone OR [email or web address] as below if video visit (depending on what platform provider is using)  6. Inform patient they will receive a phone call 15 minutes prior to their appointment time (may be from unknown caller ID) so they should be prepared to answer    TELEPHONE CALL NOTE  Sean Best has been deemed a candidate for a follow-up tele-health visit to limit community exposure during the Covid-19 pandemic. I spoke with the patient via phone to ensure availability of phone/video source, confirm preferred email & phone number, and discuss instructions and expectations.  I reminded Sean Best to be prepared with any vital sign and/or heart rhythm information that could potentially be obtained via home monitoring,  at the time of his visit. I reminded Sean Best to expect a phone call prior to his visit.  Sean Best 07/07/2018 2:51 PM   INSTRUCTIONS FOR DOWNLOADING THE MYCHART APP TO SMARTPHONE  - The patient must first make sure to have activated MyChart and know their login information - If Apple, go to [employer] and type in MyChart in the search bar and download the app. If Android, ask patient to go to [employer] and type in [place] in the search bar and download the app. The app is free but as with any other  app downloads, their phone may require them to verify saved payment information or Apple/Android password.  - The patient will need to then log into the app with their MyChart username and password, and select [facility] as their healthcare provider to link the account. When it is time for your visit, go to the MyChart app, find appointments, and click Begin Video Visit. Be sure to Select Allow for your device to access the Microphone and Camera for your visit. You will then be connected, and your provider will be with you shortly.  **If they have any issues connecting, or need assistance please contact MyChart service desk [phone])**  **If using a computer, in order to ensure the best quality for their visit they will need to use either of the following Internet Browsers: [employer], or Google Chrome**  IF USING DOXIMITY or DOXY.ME - The patient will receive a link just prior to their visit by text.     FULL LENGTH CONSENT FOR TELE-HEALTH VISIT   I hereby voluntarily request, consent and authorize [facility] and its employed or contracted physicians, physician assistants, nurse practitioners or other licensed health care professionals (the Practitioner), to provide me with telemedicine health care services (the Services") as deemed necessary by the treating Practitioner. I acknowledge and consent to receive the Services by the Practitioner via telemedicine. I understand that the telemedicine visit will involve communicating with the Practitioner through live audiovisual communication technology and the disclosure of certain medical information by electronic transmission. I acknowledge that I have been given the opportunity to request an in-person assessment or other available alternative prior to the telemedicine visit and am voluntarily participating in the telemedicine visit.  I understand that I have the right to withhold or withdraw my consent to the use of  telemedicine in the course of my care at any time, without affecting my right to future care or treatment, and that the Practitioner or I may terminate the telemedicine visit at any time. I understand that I have the right to inspect all information obtained and/or recorded in the course of the telemedicine visit and may receive copies of available information for a reasonable fee.  I understand that some of the potential risks of receiving the Services via telemedicine include:   Delay or interruption in medical evaluation due to technological equipment failure or disruption;  Information transmitted may not be sufficient (e.g. poor resolution of images) to allow for appropriate medical decision making by the Practitioner; and/or   In rare instances, security protocols could fail, causing a breach of personal health information.  Furthermore, I acknowledge that it is my responsibility to provide information about my medical history, conditions and care that is complete and accurate to the best of my ability. I acknowledge that Practitioner's advice, recommendations, and/or decision may be based on factors not within their control, such as incomplete or inaccurate data  provided by me or distortions of diagnostic images or specimens that may result from electronic transmissions. I understand that the practice of medicine is not an exact science and that Practitioner makes no warranties or guarantees regarding treatment outcomes. I acknowledge that I will receive a copy of this consent concurrently upon execution via email to the email address I last provided but may also request a printed copy by calling the office of [facility].    I understand that my insurance will be billed for this visit.   I have read or had this consent read to me.  I understand the contents of this consent, which adequately explains the benefits and risks of the Services being provided via telemedicine.   I have been  provided ample opportunity to ask questions regarding this consent and the Services and have had my questions answered to my satisfaction.  I give my informed consent for the services to be provided through the use of telemedicine in my medical care  By participating in this telemedicine visit I agree to the above.

## 2018-07-12 ENCOUNTER — Telehealth (INDEPENDENT_AMBULATORY_CARE_PROVIDER_SITE_OTHER): Payer: Medicare Other | Admitting: Internal Medicine

## 2018-07-12 ENCOUNTER — Other Ambulatory Visit: Payer: Self-pay

## 2018-07-12 DIAGNOSIS — I10 Essential (primary) hypertension: Secondary | ICD-10-CM | POA: Diagnosis not present

## 2018-07-12 DIAGNOSIS — I2510 Atherosclerotic heart disease of native coronary artery without angina pectoris: Secondary | ICD-10-CM

## 2018-07-12 DIAGNOSIS — I5032 Chronic diastolic (congestive) heart failure: Secondary | ICD-10-CM | POA: Diagnosis not present

## 2018-07-12 MED ORDER — ATENOLOL 25 MG PO TABS: ORAL_TABLET | ORAL | 3 refills | Status: DC

## 2018-07-12 NOTE — Progress Notes (Signed)
Electrophysiology TeleHealth Note   Due to national recommendations of social distancing due to COVID 19, an audio/video telehealth visit is felt to be most appropriate for this patient at this time.  See MyChart message from today for the patient's consent to telehealth for [facility].   Date:  07/12/2018   ID:  Sean Best, DOB 11/06/1933, MRN [id number]  Location: patient's home  Provider location: [address] [place]  Evaluation Performed: Follow-up visit  PCP:  Seward Carol, MD  Cardiologist:  Pixie Casino, MD  Electrophysiologist:  Dr Lovena Le  Chief Complaint:  "I doing pretty good"  History of Present Illness:    Sean Best is a 85 y.o. male who presents via audio/video conferencing for a telehealth visit today.  He has a h/o CHB, s/p PPM insertion, HTN, chronic diastolic CHF, due to diastolic dysfunction. He has ESRD and is on HD. Since last being seen in our clinic, the patient reports doing very well.  Today, he denies symptoms of palpitations, chest pain, shortness of breath,  lower extremity edema, dizziness, presyncope, or syncope.  The patient is otherwise without complaint today.  He does have some left arm swelling. The patient denies symptoms of fevers, chills, cough, or new SOB worrisome for COVID 19.  Past Medical History:  Diagnosis Date  . Anemia   . Arthritis    "knees" (04/11/2016)  . Blind left eye    S/P trauma  . Chronic total occlusion of artery of the extremities (HCC)    pt not aware of this  . Claudication ([place])   . ESRD on dialysis [facility])    "MWF; [name]" ((04/10/2016)  . ESRD on peritoneal dialysis [facility])    Started dialysis around April 2015 per son.  Has been doing peritoneal dialysis at home.     . Gout   . Hyperlipidemia   . Hypertension   . Overweight(278.02)   . Presence of permanent cardiac pacemaker    medtronic  . Prostate cancer ([place]) 1990s  . Shingles   . Stroke [facility]) ~1998   denies residual on  04/11/2016  . Type II diabetes mellitus (HCC)    diet controlled  . Umbilical hernia 5/88/5027    Past Surgical History:  Procedure Laterality Date  . AV FISTULA PLACEMENT, RADIOCEPHALIC  74/01/8785   Left arm  . CAPD INSERTION N/A 03/06/2013   Procedure: LAPAROSCOPIC INSERTION CONTINUOUS AMBULATORY PERITONEAL DIALYSIS  (CAPD) CATHETER;  Surgeon: Edward Jolly, MD;  Location: [place];  Service: General;  Laterality: N/A;  . CATARACT EXTRACTION Right   . CHOLECYSTECTOMY  12/02/2017   LAPROSCOPIC  . CORONARY STENT INTERVENTION N/A 02/11/2018   Procedure: CORONARY STENT INTERVENTION;  Surgeon: Sherren Mocha, MD;  Location: [place] CV LAB;  Service: Cardiovascular;  Laterality: N/A;  . EYE SURGERY Left 1970s   for eye injury  . HERNIA REPAIR    . INSERTION PROSTATE RADIATION SEED    . LAPAROSCOPIC CHOLECYSTECTOMY SINGLE SITE WITH INTRAOPERATIVE CHOLANGIOGRAM N/A 12/02/2017   Procedure: LAPAROSCOPIC CHOLECYSTECTOMY;  Surgeon: Michael Boston, MD;  Location: [place];  Service: General;  Laterality: N/A;  . LEFT HEART CATH AND CORONARY ANGIOGRAPHY N/A 02/11/2018   Procedure: LEFT HEART CATH AND CORONARY ANGIOGRAPHY;  Surgeon: Sherren Mocha, MD;  Location: [place] CV LAB;  Service: Cardiovascular;  Laterality: N/A;  . PACEMAKER IMPLANT N/A 04/20/2017   Procedure: PACEMAKER IMPLANT;  Surgeon: Evans Lance, MD;  Location: [place] CV LAB;  Service: Cardiovascular;  Laterality: N/A;  . UMBILICAL HERNIA REPAIR N/A 03/06/2013   Procedure: HERNIA REPAIR UMBILICAL WITH MESH;  Surgeon: Edward Jolly, MD;  [facility];  Service: General;  Laterality: N/A;    Current Outpatient Medications  Medication Sig Dispense Refill  . allopurinol (ZYLOPRIM) 100 MG tablet Take 100 mg by mouth daily.      Marland Kitchen amLODipine (NORVASC) 10 MG tablet Take 10 mg by mouth daily.    Marland Kitchen aspirin 81 MG chewable tablet Chew 1 tablet (81 mg total) by mouth daily. 60 tablet 2  . atorvastatin (LIPITOR) 40 MG  tablet Take 40 mg by mouth every evening.   3  . calcium acetate (PHOSLO) 667 MG capsule Take 3 capsules (2,001 mg total) by mouth 3 (three) times daily with meals. 90 capsule 0  . Cholecalciferol (VITAMIN D-3) 1000 units CAPS Take 1,000 Units by mouth daily.    . Cinacalcet HCl (SENSIPAR PO) Take by mouth every Monday, Wednesday, and Friday.    . escitalopram (LEXAPRO) 10 MG tablet Take 10 mg by mouth daily.    . ferrous sulfate 325 (65 FE) MG EC tablet Take 325 mg by mouth 2 (two) times daily with a meal.     . gabapentin (NEURONTIN) 100 MG capsule Take 100 mg by mouth at bedtime.  10  . isosorbide mononitrate (IMDUR) 30 MG 24 hr tablet Take 1 tablet (30 mg total) by mouth daily. (Patient taking differently: Take 30 mg by mouth 2 (two) times daily. Taking on Tues, Thurs, Sat and Sunday) 60 tablet 0  . lidocaine-prilocaine (EMLA) cream Apply 1 application topically as needed (for port access).   11  . methocarbamol (ROBAXIN) 500 MG tablet Take 500 mg by mouth 3 (three) times daily as needed.    . multivitamin (RENA-VIT) TABS tablet Take 1 tablet by mouth daily.    . nitroGLYCERIN (NITROSTAT) 0.4 MG SL tablet PLEASE SEE ATTACHED FOR DETAILED DIRECTIONS 25 tablet 1  . pantoprazole (PROTONIX) 40 MG tablet Take 1 tablet (40 mg total) by mouth 2 (two) times daily. (Patient taking differently: Take 40 mg by mouth daily. ) 60 tablet 0  . ticagrelor (BRILINTA) 90 MG TABS tablet Take 1 tablet (90 mg total) by mouth 2 (two) times daily. 60 tablet 3  . VELPHORO 500 MG chewable tablet Chew 500 mg by mouth 3 (three) times daily as needed (to break down phosphoru--swhen not taking the phoslo).   3   No current facility-administered medications for this visit.     Allergies:   Cardura [doxazosin mesylate] and Tape   Social History:  The patient  reports that he has never smoked. He has never used smokeless tobacco. He reports that he does not drink alcohol or use drugs.   Family History:  The patient's   family history includes Cancer in his father and mother; Heart disease in his mother.   ROS:  Please see the history of present illness.   All other systems are personally reviewed and negative.    Exam:   ,  Vital Signs:  BP - 179/105 P - 104  Well appearing, alert and conversant, regular work of breathing,  good skin color Eyes- anicteric, neuro- grossly intact, skin- no apparent rash or lesions or cyanosis, mouth- oral mucosa is pink   Labs/Other Tests and Data Reviewed:    Recent Labs: 02/22/2018: B Natriuretic Peptide 266.0 04/27/2018: ALT 12 04/30/2018: BUN 21; Creatinine, Ser 5.57; Hemoglobin 8.9; Platelets 220; Potassium 3.9; Sodium 136  Wt Readings from Last 3 Encounters:  06/28/18 192 lb (87.1 kg)  05/26/18 193 lb (87.5 kg)  05/11/18 195 lb (88.5 kg)     Other studies personally reviewed: Additional studies/ records that were reviewed today include:    Last device remote is reviewed from [place] PDF dated 05/11/18 which reveals normal device function, no arrhythmias    ASSESSMENT & PLAN:    1.  HTN - his bp is elevated. He will start taking atenolol on non- HD days. 2. CHB - he is asymptomatic, s/p PPM Insertion. 3. PPM - his medtronic DDD PM is working normally.  4. COVID 19 screen The patient denies symptoms of COVID 19 at this time.  The importance of social distancing was discussed today.  Follow-up:  3 months Next remote: 6/20  Current medicines are reviewed at length with the patient today.   The patient does not have concerns regarding his medicines.  The following changes were made today:  none  Labs/ tests ordered today include: none No orders of the defined types were placed in this encounter.    Patient Risk:  after full review of this patients clinical status, I feel that they are at moderate risk at this time.  Today, I have spent 15 minutes with the patient with telehealth technology discussing all of above. Loma Messing, MD   07/12/2018 3:28 PM     [place] [address] [place] [address] [phone] (office) [phone] (fax)

## 2018-07-16 LAB — SYNOVIAL CELL COUNT + DIFF, W/ CRYSTALS
Basophils, %: 0 %
Eosinophils-Synovial: 0 % (ref 0–2)
Lymphocytes-Synovial Fld: 43 % (ref 0–74)
Monocyte/Macrophage: 53 % (ref 0–69)
Neutrophil, Synovial: 4 % (ref 0–24)
Synoviocytes, %: 0 % (ref 0–15)
WBC, Synovial: 1067 cells/uL — ABNORMAL HIGH (ref <150)

## 2018-08-09 ENCOUNTER — Ambulatory Visit (INDEPENDENT_AMBULATORY_CARE_PROVIDER_SITE_OTHER): Payer: Medicare Other | Admitting: *Deleted

## 2018-08-09 ENCOUNTER — Telehealth: Payer: Self-pay

## 2018-08-09 DIAGNOSIS — I5032 Chronic diastolic (congestive) heart failure: Secondary | ICD-10-CM | POA: Diagnosis not present

## 2018-08-09 DIAGNOSIS — Z8673 Personal history of transient ischemic attack (TIA), and cerebral infarction without residual deficits: Secondary | ICD-10-CM | POA: Diagnosis not present

## 2018-08-09 LAB — CUP PACEART REMOTE DEVICE CHECK
Battery Remaining Longevity: 131 mo
Battery Voltage: 2.98 V
Brady Statistic AP VP Percent: 0 %
Brady Statistic AP VS Percent: 1.32 %
Brady Statistic AS VP Percent: 0.05 %
Brady Statistic AS VS Percent: 98.62 %
Brady Statistic RA Percent Paced: 1.37 %
Brady Statistic RV Percent Paced: 0.05 %
Date Time Interrogation Session: 20200609105005
Implantable Lead Implant Date: 20190219
Implantable Lead Implant Date: 20190219
Implantable Lead Location: 753859
Implantable Lead Location: 753860
Implantable Lead Model: 3830
Implantable Lead Model: 5076
Implantable Pulse Generator Implant Date: 20190219
Lead Channel Impedance Value: 285 Ohm
Lead Channel Impedance Value: 304 Ohm
Lead Channel Impedance Value: 380 Ohm
Lead Channel Impedance Value: 418 Ohm
Lead Channel Pacing Threshold Amplitude: 0.5 V
Lead Channel Pacing Threshold Amplitude: 1.125 V
Lead Channel Pacing Threshold Pulse Width: 0.4 ms
Lead Channel Pacing Threshold Pulse Width: 0.4 ms
Lead Channel Sensing Intrinsic Amplitude: 3.875 mV
Lead Channel Sensing Intrinsic Amplitude: 3.875 mV
Lead Channel Sensing Intrinsic Amplitude: 6.5 mV
Lead Channel Sensing Intrinsic Amplitude: 6.5 mV
Lead Channel Setting Pacing Amplitude: 2 V
Lead Channel Setting Pacing Amplitude: 2.5 V
Lead Channel Setting Pacing Pulse Width: 1 ms
Lead Channel Setting Sensing Sensitivity: 1.2 mV

## 2018-08-09 NOTE — Telephone Encounter (Signed)
Spoke to pt regarding fast A&V & NSVT; pt denies symptoms. Pt reports hemodialysis schedule is MWF; NSVT episodes appear to correlate with HD schedule. Pt reports "feeling good for a change." Transmission sent through for review by Dr. Lovena Le.

## 2018-08-18 NOTE — Progress Notes (Signed)
Remote pacemaker transmission.   

## 2018-08-22 ENCOUNTER — Emergency Department (HOSPITAL_COMMUNITY)
Admission: EM | Admit: 2018-08-22 | Discharge: 2018-08-22 | Disposition: A | Discharge status: Home / Self Care | Payer: Medicare Other | Attending: Emergency Medicine | Admitting: Emergency Medicine

## 2018-08-22 ENCOUNTER — Other Ambulatory Visit: Payer: Self-pay

## 2018-08-22 ENCOUNTER — Encounter (HOSPITAL_COMMUNITY): Payer: Self-pay

## 2018-08-22 DIAGNOSIS — Z79899 Other long term (current) drug therapy: Secondary | ICD-10-CM | POA: Diagnosis not present

## 2018-08-22 DIAGNOSIS — Z992 Dependence on renal dialysis: Secondary | ICD-10-CM | POA: Diagnosis not present

## 2018-08-22 DIAGNOSIS — E1122 Type 2 diabetes mellitus with diabetic chronic kidney disease: Secondary | ICD-10-CM | POA: Insufficient documentation

## 2018-08-22 DIAGNOSIS — N186 End stage renal disease: Secondary | ICD-10-CM | POA: Diagnosis not present

## 2018-08-22 DIAGNOSIS — T82838A Hemorrhage of vascular prosthetic devices, implants and grafts, initial encounter: Secondary | ICD-10-CM | POA: Insufficient documentation

## 2018-08-22 DIAGNOSIS — Z8673 Personal history of transient ischemic attack (TIA), and cerebral infarction without residual deficits: Secondary | ICD-10-CM | POA: Insufficient documentation

## 2018-08-22 DIAGNOSIS — I252 Old myocardial infarction: Secondary | ICD-10-CM | POA: Diagnosis not present

## 2018-08-22 DIAGNOSIS — Z950 Presence of cardiac pacemaker: Secondary | ICD-10-CM | POA: Insufficient documentation

## 2018-08-22 DIAGNOSIS — Y711 Therapeutic (nonsurgical) and rehabilitative cardiovascular devices associated with adverse incidents: Secondary | ICD-10-CM | POA: Insufficient documentation

## 2018-08-22 DIAGNOSIS — I120 Hypertensive chronic kidney disease with stage 5 chronic kidney disease or end stage renal disease: Secondary | ICD-10-CM | POA: Diagnosis not present

## 2018-08-22 DIAGNOSIS — T829XXA Unspecified complication of cardiac and vascular prosthetic device, implant and graft, initial encounter: Secondary | ICD-10-CM | POA: Insufficient documentation

## 2018-08-22 DIAGNOSIS — Z7982 Long term (current) use of aspirin: Secondary | ICD-10-CM | POA: Insufficient documentation

## 2018-08-22 MED ORDER — TRANEXAMIC ACID 1000 MG/10ML IV SOLN
2000.0000 mg | Freq: Once | INTRAVENOUS | Status: AC
  Administered 2018-08-22: 2000 mg via TOPICAL
  Filled 2018-08-22: qty 20

## 2018-08-22 NOTE — ED Provider Notes (Signed)
[place] EMERGENCY DEPARTMENT Provider Note   CSN: [id number] Arrival date & time: 08/22/18  1803    History   Chief Complaint Chief Complaint  Patient presents with  . Vascular Access Problem    HPI Sean Best is a 85 y.o. male.    Patient has a past medical history of obesity, ESRD on dialysis Monday, Wednesday, Friday, diabetes, hyperlipidemia, hypertension, prostate cancer, gout who presents status post full dialysis session, left upper extremity fistula bleeding after dialysis, unable to stop bleeding at dialysis.  Gauze and clamp were placed on fistula, patient brought to ED for further evaluation and care  Up until dialysis session, normal course of affairs, no other complaints, no infectious review of systems, afebrile, no chills, no cough, has had bleeding problems with fistula after dialysis previously, has never persisted this long.  The history is provided by the patient and medical records.    Past Medical History:  Diagnosis Date  . Anemia   . Arthritis    "knees" (04/11/2016)  . Blind left eye    S/P trauma  . Chronic total occlusion of artery of the extremities (HCC)    pt not aware of this  . Claudication ([place])   . ESRD on dialysis [facility])    "MWF; [name]" ((04/10/2016)  . ESRD on peritoneal dialysis [facility])    Started dialysis around April 2015 per son.  Has been doing peritoneal dialysis at home.     . Gout   . Hyperlipidemia   . Hypertension   . Overweight(278.02)   . Presence of permanent cardiac pacemaker    medtronic  . Prostate cancer ([place]) 1990s  . Shingles   . Stroke [facility]) ~1998   denies residual on 04/11/2016  . Type II diabetes mellitus (HCC)    diet controlled  . Umbilical hernia 4/94/4967    Patient Active Problem List   Diagnosis Date Noted  . Left bundle branch block (LBBB) determined by electrocardiography 02/13/2018  . Aortic stenosis 02/13/2018  . NSTEMI (non-ST elevated myocardial infarction) ([place])  02/13/2018  . Constipation 12/08/2017  . Abdominal pain 12/06/2017  . Leukocytosis 12/06/2017  . Gout 12/06/2017  . Chronic pain 12/06/2017  . Acute on chronic cholecystitis s/p lap cholecystectomy 12/02/2017 12/02/2017  . Cardiac pacemaker in situ (Medtronic DDD) 12/02/2017  . Acute hypoxemic respiratory failure ([place]) 04/18/2017  . Heart block 04/17/2017  . Elevated troponin 04/17/2017  . ESRD on hemodialysis ([place]) 04/12/2016  . H/O: stroke 04/11/2016  . Saccular aneurysm of infrarenal aorta with thrombosed dissection of abdominal aorta 04/11/2016  . Obesity (BMI 30-39.9) 04/11/2016  . Syncope 04/11/2016  . Prostate CA ([place]) 12/28/2014  . Chest pain 12/28/2014  . C. difficile colitis 10/04/2014  . Hyperkalemia 03/11/2013  . Anemia 03/11/2013  . DM (diabetes mellitus), type 2 with renal complications, diet controlled 03/11/2013  . HTN (hypertension) 03/11/2013  . Hyperlipidemia 03/11/2013  . ESRD on dialysis [facility]) 03/20/2011    Past Surgical History:  Procedure Laterality Date  . AV FISTULA PLACEMENT, RADIOCEPHALIC  59/16/3846   Left arm  . CAPD INSERTION N/A 03/06/2013   Procedure: LAPAROSCOPIC INSERTION CONTINUOUS AMBULATORY PERITONEAL DIALYSIS  (CAPD) CATHETER;  Surgeon: Edward Jolly, MD;  Location: [place];  Service: General;  Laterality: N/A;  . CATARACT EXTRACTION Right   . CHOLECYSTECTOMY  12/02/2017   LAPROSCOPIC  . CORONARY STENT INTERVENTION N/A 02/11/2018   Procedure: CORONARY STENT INTERVENTION;  Surgeon: Sherren Mocha, MD;  Location: [place] CV LAB;  Service: Cardiovascular;  Laterality: N/A;  . EYE SURGERY Left 1970s   for eye injury  . HERNIA REPAIR    . INSERTION PROSTATE RADIATION SEED    . LAPAROSCOPIC CHOLECYSTECTOMY SINGLE SITE WITH INTRAOPERATIVE CHOLANGIOGRAM N/A 12/02/2017   Procedure: LAPAROSCOPIC CHOLECYSTECTOMY;  Surgeon: Michael Boston, MD;  Location: [place];  Service: General;  Laterality: N/A;  . LEFT HEART CATH AND CORONARY ANGIOGRAPHY  N/A 02/11/2018   Procedure: LEFT HEART CATH AND CORONARY ANGIOGRAPHY;  Surgeon: Sherren Mocha, MD;  Location: [place] CV LAB;  Service: Cardiovascular;  Laterality: N/A;  . PACEMAKER IMPLANT N/A 04/20/2017   Procedure: PACEMAKER IMPLANT;  Surgeon: Evans Lance, MD;  Location: [place] CV LAB;  Service: Cardiovascular;  Laterality: N/A;  . UMBILICAL HERNIA REPAIR N/A 03/06/2013   Procedure: HERNIA REPAIR UMBILICAL WITH MESH;  Surgeon: Edward Jolly, MD;  [facility];  Service: General;  Laterality: N/A;        Home Medications    Prior to Admission medications   Medication Sig Start Date End Date Taking? Authorizing Provider  allopurinol (ZYLOPRIM) 100 MG tablet Take 100 mg by mouth daily.      [provider]  amLODipine (NORVASC) 10 MG tablet Take 10 mg by mouth daily. 05/15/18   [provider]  aspirin 81 MG chewable tablet Chew 1 tablet (81 mg total) by mouth daily. 02/14/18   Desiree Hane, MD  atenolol (TENORMIN) 25 MG tablet Take one tablet by mouth daily on NON HEMODIALYSIS days only 07/12/18   Evans Lance, MD  atorvastatin (LIPITOR) 40 MG tablet Take 40 mg by mouth every evening.  07/07/17   [provider]  calcium acetate (PHOSLO) 667 MG capsule Take 3 capsules (2,001 mg total) by mouth 3 (three) times daily with meals. 04/30/18   Nita Sells, MD  Cholecalciferol (VITAMIN D-3) 1000 units CAPS Take 1,000 Units by mouth daily.    [provider]  Cinacalcet HCl (SENSIPAR PO) Take by mouth every Monday, Wednesday, and Friday.    [provider]  escitalopram (LEXAPRO) 10 MG tablet Take 10 mg by mouth daily. 04/06/18   [provider]  ferrous sulfate 325 (65 FE) MG EC tablet Take 325 mg by mouth 2 (two) times daily with a meal.     [provider]  gabapentin (NEURONTIN) 100 MG capsule Take 100 mg by mouth at bedtime. 12/27/16   [provider]  isosorbide mononitrate (IMDUR) 30 MG 24  hr tablet Take 1 tablet (30 mg total) by mouth daily. Patient taking differently: Take 30 mg by mouth 2 (two) times daily. Taking on [place], Sat and Sunday 02/14/18   Oretha Milch D, MD  lidocaine-prilocaine (EMLA) cream Apply 1 application topically as needed (for port access).  10/28/17   [provider]  methocarbamol (ROBAXIN) 500 MG tablet Take 500 mg by mouth 3 (three) times daily as needed. 02/22/18   [provider]  multivitamin (RENA-VIT) TABS tablet Take 1 tablet by mouth daily.    [provider]  nitroGLYCERIN (NITROSTAT) 0.4 MG SL tablet PLEASE SEE ATTACHED FOR DETAILED DIRECTIONS 06/17/18   Leonie Man, MD  pantoprazole (PROTONIX) 40 MG tablet Take 1 tablet (40 mg total) by mouth 2 (two) times daily. Patient taking differently: Take 40 mg by mouth daily.  02/13/18 04/27/18  Oretha Milch D, MD  ticagrelor (BRILINTA) 90 MG TABS tablet Take 1 tablet (90 mg total) by mouth 2 (two) times daily. 02/13/18  Desiree Hane, MD  VELPHORO 500 MG chewable tablet Chew 500 mg by mouth 3 (three) times daily as needed (to break down phosphoru--swhen not taking the phoslo).  01/28/18   [provider]    Family History Family History  Problem Relation Age of Onset  . Cancer Mother   . Heart disease Mother   . Cancer Father     Social History Social History   Tobacco Use  . Smoking status: Never Smoker  . Smokeless tobacco: Never Used  Substance Use Topics  . Alcohol use: No  . Drug use: No     Allergies   Cardura [doxazosin mesylate] and Tape   Review of Systems Review of Systems  All other systems reviewed and are negative.    Physical Exam Updated Vital Signs BP (!) 154/105 (BP Location: Right Arm)   Pulse 81   Temp 98.5 F (36.9 C) (Oral)   Resp 18   Ht 5\' 5"  (1.651 m)   Wt 86.6 kg   SpO2 97%   BMI 31.78 kg/m   Physical Exam Vitals signs and nursing note reviewed.  Constitutional:      Appearance: He is  well-developed.  HENT:     Head: Normocephalic and atraumatic.     Mouth/Throat:     Mouth: Mucous membranes are moist.  Eyes:     Conjunctiva/sclera: Conjunctivae normal.  Neck:     Musculoskeletal: Neck supple.  Cardiovascular:     Rate and Rhythm: Normal rate and regular rhythm.  Pulmonary:     Effort: Pulmonary effort is normal. No respiratory distress.     Breath sounds: Normal breath sounds. No stridor. No wheezing or rhonchi.  Abdominal:     Palpations: Abdomen is soft.     Tenderness: There is no abdominal tenderness.  Skin:    General: Skin is warm and dry.     Comments: Left upper extremity fistula in place with palpable thrill, moderate oozing of blood from small site on top of fistula  Neurological:     General: No focal deficit present.     Mental Status: He is alert and oriented to person, place, and time.      ED Treatments / Results  Labs (all labs ordered are listed, but only abnormal results are displayed) Labs Reviewed - No data to display  EKG None  Radiology No results found.  Procedures Procedures (including critical care time)  Medications Ordered in ED Medications  tranexamic acid (CYKLOKAPRON) 2,000 mg in sodium chloride 0.9 % 50 mL Topical Application (has no administration in time range)     Initial Impression / Assessment and Plan / ED Course  I have reviewed the triage vital signs and the nursing notes.  Pertinent labs & imaging results that were available during my care of the patient were reviewed by me and considered in my medical decision making (see chart for details).        Medical Decision Making: Leotis Isham is a 85 y.o. male who presented to the ED today with bleeding fistula after dialysis.  Reviewed and confirmed nursing documentation for past medical history, family history, social history.  On my initial exam, the pt was calm, cooperative, conversant, follows commands appropriately, GCS 15, not tachycardic, not  hypotensive, afebrile, no increased work of breathing or respiratory distress, no signs of impending respiratory failure.  Fistula moderately oozing when gauze removed. Gauze replaced with pressure clamp. Soaked gauze and TXA and replaced with pressure clamp. All radiology and  laboratory studies reviewed independently and with my attending physician, agree with reading provided by radiologist unless otherwise noted.  Upon reassessing patient, patient was calm, no new complaints, bleeding controlled, bleeding not present when gauze removed. Based on the above findings, I believe patient is hemodynamically stable for discharge.  Patient educated about specific return precautions for given chief complaint and symptoms.  Patient educated about follow-up with PCP.  Patient expressed understanding of return precautions and need for follow-up.  Patient discharged. The above care was discussed with and agreed upon by my attending physician. Emergency Department Medication Summary:  Medications  tranexamic acid (CYKLOKAPRON) 2,000 mg in sodium chloride 0.9 % 50 mL Topical Application (has no administration in time range)        Final Clinical Impressions(s) / ED Diagnoses   Final diagnoses:  ESRD (end stage renal disease) ([place])  Complication of vascular access for dialysis, initial encounter  Bleeding from dialysis shunt, initial encounter [facility])    ED Discharge Orders    None       Lonzo Candy, MD 08/22/18 2018    Merrily Pew, MD 08/23/18 [phone]

## 2018-08-22 NOTE — ED Triage Notes (Signed)
Pt finished dialysis around 1600, fistula would not stop bleeding in left arm, it was clamped, then unclamped and as the pt was trying to leave it started back bleeding and kept bleeding. Clamp back in placed and bleeding controlled. Hypertensive 198/100, HR 80, 97%

## 2018-08-22 NOTE — Discharge Instructions (Addendum)
Advance activities of daily living as tolerated. Stand up and ambulate with assistance until comfortable doing independently.  Advance diet as tolerated based on nausea, vomiting. Start with clear liquids and soft mechanical and advance.  Please return to ED for chest pain, shortness of breath, uncontrolled nausea, vomiting, any new concerning symptoms.  If prescribed any medications please take appropriate dose as prescribed and appropriate frequency has prescribed.  Please follow up with your PCP in 2-4 weeks or sooner if needed.

## 2018-10-05 DIAGNOSIS — T782XXA Anaphylactic shock, unspecified, initial encounter: Secondary | ICD-10-CM | POA: Insufficient documentation

## 2018-10-05 DIAGNOSIS — T7840XA Allergy, unspecified, initial encounter: Secondary | ICD-10-CM | POA: Insufficient documentation

## 2018-10-15 ENCOUNTER — Encounter (HOSPITAL_COMMUNITY): Payer: Self-pay | Admitting: Emergency Medicine

## 2018-10-15 ENCOUNTER — Other Ambulatory Visit: Payer: Self-pay

## 2018-10-15 ENCOUNTER — Emergency Department (HOSPITAL_COMMUNITY): Payer: Medicare Other

## 2018-10-15 ENCOUNTER — Inpatient Hospital Stay (HOSPITAL_COMMUNITY)
Admission: EM | Admit: 2018-10-15 | Discharge: 2018-10-18 | DRG: 077 | Disposition: A | Discharge status: Home / Self Care | Payer: Medicare Other | Attending: Internal Medicine | Admitting: Internal Medicine

## 2018-10-15 DIAGNOSIS — Z950 Presence of cardiac pacemaker: Secondary | ICD-10-CM | POA: Diagnosis not present

## 2018-10-15 DIAGNOSIS — H5462 Unqualified visual loss, left eye, normal vision right eye: Secondary | ICD-10-CM | POA: Diagnosis present

## 2018-10-15 DIAGNOSIS — N186 End stage renal disease: Secondary | ICD-10-CM | POA: Diagnosis present

## 2018-10-15 DIAGNOSIS — Z8546 Personal history of malignant neoplasm of prostate: Secondary | ICD-10-CM | POA: Diagnosis not present

## 2018-10-15 DIAGNOSIS — E8889 Other specified metabolic disorders: Secondary | ICD-10-CM | POA: Diagnosis present

## 2018-10-15 DIAGNOSIS — Z6832 Body mass index (BMI) 32.0-32.9, adult: Secondary | ICD-10-CM | POA: Diagnosis not present

## 2018-10-15 DIAGNOSIS — Z992 Dependence on renal dialysis: Secondary | ICD-10-CM | POA: Diagnosis not present

## 2018-10-15 DIAGNOSIS — D509 Iron deficiency anemia, unspecified: Secondary | ICD-10-CM | POA: Diagnosis present

## 2018-10-15 DIAGNOSIS — E669 Obesity, unspecified: Secondary | ICD-10-CM | POA: Diagnosis present

## 2018-10-15 DIAGNOSIS — I10 Essential (primary) hypertension: Secondary | ICD-10-CM | POA: Diagnosis present

## 2018-10-15 DIAGNOSIS — E1129 Type 2 diabetes mellitus with other diabetic kidney complication: Secondary | ICD-10-CM | POA: Diagnosis present

## 2018-10-15 DIAGNOSIS — E1122 Type 2 diabetes mellitus with diabetic chronic kidney disease: Secondary | ICD-10-CM | POA: Diagnosis present

## 2018-10-15 DIAGNOSIS — N2581 Secondary hyperparathyroidism of renal origin: Secondary | ICD-10-CM | POA: Diagnosis present

## 2018-10-15 DIAGNOSIS — I160 Hypertensive urgency: Secondary | ICD-10-CM | POA: Diagnosis not present

## 2018-10-15 DIAGNOSIS — E1151 Type 2 diabetes mellitus with diabetic peripheral angiopathy without gangrene: Secondary | ICD-10-CM | POA: Diagnosis present

## 2018-10-15 DIAGNOSIS — E785 Hyperlipidemia, unspecified: Secondary | ICD-10-CM | POA: Diagnosis present

## 2018-10-15 DIAGNOSIS — I120 Hypertensive chronic kidney disease with stage 5 chronic kidney disease or end stage renal disease: Secondary | ICD-10-CM | POA: Diagnosis present

## 2018-10-15 DIAGNOSIS — I69354 Hemiplegia and hemiparesis following cerebral infarction affecting left non-dominant side: Secondary | ICD-10-CM | POA: Diagnosis not present

## 2018-10-15 DIAGNOSIS — Z7982 Long term (current) use of aspirin: Secondary | ICD-10-CM

## 2018-10-15 DIAGNOSIS — I674 Hypertensive encephalopathy: Principal | ICD-10-CM | POA: Diagnosis present

## 2018-10-15 DIAGNOSIS — R197 Diarrhea, unspecified: Secondary | ICD-10-CM | POA: Diagnosis present

## 2018-10-15 DIAGNOSIS — Z20828 Contact with and (suspected) exposure to other viral communicable diseases: Secondary | ICD-10-CM | POA: Diagnosis present

## 2018-10-15 DIAGNOSIS — Z8249 Family history of ischemic heart disease and other diseases of the circulatory system: Secondary | ICD-10-CM

## 2018-10-15 DIAGNOSIS — C61 Malignant neoplasm of prostate: Secondary | ICD-10-CM | POA: Diagnosis not present

## 2018-10-15 DIAGNOSIS — Z9049 Acquired absence of other specified parts of digestive tract: Secondary | ICD-10-CM

## 2018-10-15 DIAGNOSIS — M109 Gout, unspecified: Secondary | ICD-10-CM | POA: Diagnosis present

## 2018-10-15 DIAGNOSIS — Z9841 Cataract extraction status, right eye: Secondary | ICD-10-CM

## 2018-10-15 DIAGNOSIS — I4510 Unspecified right bundle-branch block: Secondary | ICD-10-CM | POA: Diagnosis present

## 2018-10-15 DIAGNOSIS — Z79899 Other long term (current) drug therapy: Secondary | ICD-10-CM

## 2018-10-15 DIAGNOSIS — M170 Bilateral primary osteoarthritis of knee: Secondary | ICD-10-CM | POA: Diagnosis present

## 2018-10-15 DIAGNOSIS — I350 Nonrheumatic aortic (valve) stenosis: Secondary | ICD-10-CM | POA: Diagnosis present

## 2018-10-15 DIAGNOSIS — I161 Hypertensive emergency: Secondary | ICD-10-CM | POA: Diagnosis present

## 2018-10-15 DIAGNOSIS — Z91048 Other nonmedicinal substance allergy status: Secondary | ICD-10-CM

## 2018-10-15 DIAGNOSIS — Z955 Presence of coronary angioplasty implant and graft: Secondary | ICD-10-CM

## 2018-10-15 DIAGNOSIS — Z888 Allergy status to other drugs, medicaments and biological substances status: Secondary | ICD-10-CM

## 2018-10-15 DIAGNOSIS — Z7902 Long term (current) use of antithrombotics/antiplatelets: Secondary | ICD-10-CM

## 2018-10-15 LAB — TROPONIN I (HIGH SENSITIVITY): Troponin I (High Sensitivity): 59 ng/L — ABNORMAL HIGH (ref <18)

## 2018-10-15 LAB — COMPREHENSIVE METABOLIC PANEL
ALT: 13 U/L (ref 0–44)
AST: 14 U/L — ABNORMAL LOW (ref 15–41)
Albumin: 3.5 g/dL (ref 3.5–5.0)
Alkaline Phosphatase: 68 U/L (ref 38–126)
Anion gap: 15 (ref 5–15)
BUN: 33 mg/dL — ABNORMAL HIGH (ref 8–23)
CO2: 27 mmol/L (ref 22–32)
Calcium: 9.4 mg/dL (ref 8.9–10.3)
Chloride: 98 mmol/L (ref 98–111)
Creatinine, Ser: 8.09 mg/dL — ABNORMAL HIGH (ref 0.61–1.24)
GFR calc Af Amer: 6 mL/min — ABNORMAL LOW (ref >60)
GFR calc non Af Amer: 6 mL/min — ABNORMAL LOW (ref >60)
Glucose, Bld: 113 mg/dL — ABNORMAL HIGH (ref 70–99)
Potassium: 4.9 mmol/L (ref 3.5–5.1)
Sodium: 140 mmol/L (ref 135–145)
Total Bilirubin: 0.6 mg/dL (ref 0.3–1.2)
Total Protein: 7.1 g/dL (ref 6.5–8.1)

## 2018-10-15 LAB — I-STAT CHEM 8, ED
BUN: 45 mg/dL — ABNORMAL HIGH (ref 8–23)
Calcium, Ion: 1.02 mmol/L — ABNORMAL LOW (ref 1.15–1.40)
Chloride: 100 mmol/L (ref 98–111)
Creatinine, Ser: 8.3 mg/dL — ABNORMAL HIGH (ref 0.61–1.24)
Glucose, Bld: 104 mg/dL — ABNORMAL HIGH (ref 70–99)
HCT: 39 % (ref 39.0–52.0)
Hemoglobin: 13.3 g/dL (ref 13.0–17.0)
Potassium: 5.2 mmol/L — ABNORMAL HIGH (ref 3.5–5.1)
Sodium: 138 mmol/L (ref 135–145)
TCO2: 31 mmol/L (ref 22–32)

## 2018-10-15 LAB — AMMONIA: Ammonia: 24 umol/L (ref 9–35)

## 2018-10-15 LAB — CK: Total CK: 103 U/L (ref 49–397)

## 2018-10-15 LAB — ETHANOL: Alcohol, Ethyl (B): <10 mg/dL (ref <10)

## 2018-10-15 LAB — CBG MONITORING, ED: Glucose-Capillary: 120 mg/dL — ABNORMAL HIGH (ref 70–99)

## 2018-10-15 MED ORDER — LABETALOL HCL 5 MG/ML IV SOLN
10.0000 mg | Freq: Once | INTRAVENOUS | Status: AC
  Administered 2018-10-15: 10 mg via INTRAVENOUS
  Filled 2018-10-15: qty 4

## 2018-10-15 MED ORDER — HYDRALAZINE HCL 20 MG/ML IJ SOLN
10.0000 mg | Freq: Once | INTRAMUSCULAR | Status: AC
  Administered 2018-10-15: 10 mg via INTRAVENOUS
  Filled 2018-10-15: qty 1

## 2018-10-15 MED ORDER — AMLODIPINE BESYLATE 10 MG PO TABS
10.0000 mg | ORAL_TABLET | Freq: Every day | ORAL | Status: DC
  Administered 2018-10-15 – 2018-10-18 (×4): 10 mg via ORAL
  Filled 2018-10-15 (×3): qty 1
  Filled 2018-10-15: qty 2

## 2018-10-15 MED ORDER — ATENOLOL 25 MG PO TABS
25.0000 mg | ORAL_TABLET | Freq: Every day | ORAL | Status: DC
  Administered 2018-10-15 – 2018-10-18 (×4): 25 mg via ORAL
  Filled 2018-10-15 (×4): qty 1

## 2018-10-15 NOTE — ED Notes (Signed)
ED TO INPATIENT HANDOFF REPORT  ED Nurse Name and Phone #  Daralene Milch, RN  [phone]  S Name/Age/Gender Sean Best 85 y.o. male Room/Bed: 037C/037C  Code Status   Code Status: Prior  Home/SNF/Other Home Patient oriented to: self, place, time and situation Is this baseline? Yes   Triage Complete: Triage complete  Chief Complaint Tremors  Triage Note Per [facility] pt reports waking up around 5:15 this afternoon feeling lethargic and having tremors in both arms. Patient states he slept all day, has not taken any medications. Had full dialysis treatment yesterday.    Allergies Allergies  Allergen Reactions  . Cardura [Doxazosin Mesylate] Other (See Comments)    Hallucinations  . Tape     Paper tape    Level of Care/Admitting Diagnosis ED Disposition    ED Disposition Condition [facility] Area: [place] [[id number]]  Level of Care: Progressive [102]  Covid Evaluation: Asymptomatic Screening Protocol (No Symptoms)  Diagnosis: Hypertensive encephalopathy [437.2.ICD-9-CM]  Admitting Physician: Bonnell Public [[id number]]  Attending Physician: Dana Allan I [[id number]]  Estimated length of stay: past midnight tomorrow  Certification:: I certify this patient will need inpatient services for at least 2 midnights  PT Class (Do Not Modify): Inpatient [101]  PT Acc Code (Do Not Modify): Private [1]       B Medical/Surgery History Past Medical History:  Diagnosis Date  . Anemia   . Arthritis    "knees" (04/11/2016)  . Blind left eye    S/P trauma  . Chronic total occlusion of artery of the extremities (HCC)    pt not aware of this  . Claudication ([place])   . ESRD on dialysis [facility])    "MWF; [name]" ((04/10/2016)  . ESRD on peritoneal dialysis [facility])    Started dialysis around April 2015 per son.  Has been doing peritoneal dialysis at home.     . Gout   . Hyperlipidemia   . Hypertension   . Overweight(278.02)   . Presence of  permanent cardiac pacemaker    medtronic  . Prostate cancer ([place]) 1990s  . Shingles   . Stroke [facility]) ~1998   denies residual on 04/11/2016  . Type II diabetes mellitus (HCC)    diet controlled  . Umbilical hernia 11/23/2681   Past Surgical History:  Procedure Laterality Date  . AV FISTULA PLACEMENT, RADIOCEPHALIC  41/96/2229   Left arm  . CAPD INSERTION N/A 03/06/2013   Procedure: LAPAROSCOPIC INSERTION CONTINUOUS AMBULATORY PERITONEAL DIALYSIS  (CAPD) CATHETER;  Surgeon: Edward Jolly, MD;  Location: [place];  Service: General;  Laterality: N/A;  . CATARACT EXTRACTION Right   . CHOLECYSTECTOMY  12/02/2017   LAPROSCOPIC  . CORONARY STENT INTERVENTION N/A 02/11/2018   Procedure: CORONARY STENT INTERVENTION;  Surgeon: Sherren Mocha, MD;  Location: [place] CV LAB;  Service: Cardiovascular;  Laterality: N/A;  . EYE SURGERY Left 1970s   for eye injury  . HERNIA REPAIR    . INSERTION PROSTATE RADIATION SEED    . LAPAROSCOPIC CHOLECYSTECTOMY SINGLE SITE WITH INTRAOPERATIVE CHOLANGIOGRAM N/A 12/02/2017   Procedure: LAPAROSCOPIC CHOLECYSTECTOMY;  Surgeon: Michael Boston, MD;  Location: [place];  Service: General;  Laterality: N/A;  . LEFT HEART CATH AND CORONARY ANGIOGRAPHY N/A 02/11/2018   Procedure: LEFT HEART CATH AND CORONARY ANGIOGRAPHY;  Surgeon: Sherren Mocha, MD;  Location: [place] CV LAB;  Service: Cardiovascular;  Laterality: N/A;  . PACEMAKER IMPLANT N/A 04/20/2017   Procedure: PACEMAKER IMPLANT;  Surgeon: Lovena Le,  Champ Mungo, MD;  Location: [place] CV LAB;  Service: Cardiovascular;  Laterality: N/A;  . UMBILICAL HERNIA REPAIR N/A 03/06/2013   Procedure: HERNIA REPAIR UMBILICAL WITH MESH;  Surgeon: Edward Jolly, MD;  Location: [place] OR;  Service: General;  Laterality: N/A;     A IV Location/Drains/Wounds Patient Lines/Drains/Airways Status   Active Line/Drains/Airways    Name:   Placement date:   Placement time:   Site:   Days:   Peripheral IV 10/15/18 Right Arm    10/15/18    2104    Arm   less than 1   Fistula / Graft Left Forearm Arteriovenous fistula   -    -    Forearm             Intake/Output Last 24 hours No intake or output data in the 24 hours ending 10/15/18 2336  Labs/Imaging Results for orders placed or performed during the hospital encounter of 10/15/18 (from the past 48 hour(s))  CBG monitoring, ED     Status: Abnormal   Collection Time: 10/15/18  6:54 PM  Result Value Ref Range   Glucose-Capillary 120 (H) 70 - 99 mg/dL  Comprehensive metabolic panel     Status: Abnormal   Collection Time: 10/15/18  7:32 PM  Result Value Ref Range   Sodium 140 135 - 145 mmol/L   Potassium 4.9 3.5 - 5.1 mmol/L   Chloride 98 98 - 111 mmol/L   CO2 27 22 - 32 mmol/L   Glucose, Bld 113 (H) 70 - 99 mg/dL   BUN 33 (H) 8 - 23 mg/dL   Creatinine, Ser 8.09 (H) 0.61 - 1.24 mg/dL   Calcium 9.4 8.9 - 10.3 mg/dL   Total Protein 7.1 6.5 - 8.1 g/dL   Albumin 3.5 3.5 - 5.0 g/dL   AST 14 (L) 15 - 41 U/L   ALT 13 0 - 44 U/L   Alkaline Phosphatase 68 38 - 126 U/L   Total Bilirubin 0.6 0.3 - 1.2 mg/dL   GFR calc non Af Amer 6 (L) >60 mL/min   GFR calc Af Amer 6 (L) >60 mL/min   Anion gap 15 5 - 15    Comment: Performed at [facility], [address] [place], [place] [address]  Ethanol     Status: None   Collection Time: 10/15/18  7:32 PM  Result Value Ref Range   Alcohol, Ethyl (B) <10 <10 mg/dL    Comment: (NOTE) Lowest detectable limit for serum alcohol is 10 mg/dL. For medical purposes only. Performed at [facility], [place] [address], [place], [place] [address]   CK     Status: None   Collection Time: 10/15/18  7:32 PM  Result Value Ref Range   Total CK 103 49 - 397 U/L    Comment: Performed at [facility], [place] [address], [place], [place] [address]  Ammonia     Status: None   Collection Time: 10/15/18  7:32 PM  Result Value Ref Range   Ammonia 24 9 - 35 umol/L    Comment: Performed at [facility], [place] [address], [place], [place] [address]  Troponin I (High Sensitivity)     Status: Abnormal   Collection Time: 10/15/18  7:32 PM  Result Value Ref Range   Troponin I (High Sensitivity) 59 (H) <18 ng/L    Comment: (NOTE) Elevated high sensitivity troponin I (hsTnI) values and significant  changes across serial measurements may  suggest ACS but many other  chronic and acute conditions are known to elevate hsTnI results.  Refer to the "Links" section for chest pain algorithms and additional  guidance. Performed at [facility], [place] [address] [place], [place] [address]   I-stat chem 8, ED (not at [facility] or [facility])     Status: Abnormal   Collection Time: 10/15/18  7:41 PM  Result Value Ref Range   Sodium 138 135 - 145 mmol/L   Potassium 5.2 (H) 3.5 - 5.1 mmol/L   Chloride 100 98 - 111 mmol/L   BUN 45 (H) 8 - 23 mg/dL   Creatinine, Ser 8.30 (H) 0.61 - 1.24 mg/dL   Glucose, Bld 104 (H) 70 - 99 mg/dL   Calcium, Ion 1.02 (L) 1.15 - 1.40 mmol/L   TCO2 31 22 - 32 mmol/L   Hemoglobin 13.3 13.0 - 17.0 g/dL   HCT 39.0 39.0 - 52.0 %   Ct Head Wo Contrast  Result Date: 10/15/2018 CLINICAL DATA:  85 year old male with altered mental status. EXAM: CT HEAD WITHOUT CONTRAST TECHNIQUE: Contiguous axial images were obtained from the base of the skull through the vertex without intravenous contrast. COMPARISON:  Head CT dated 12/09/2013 FINDINGS: Brain: There is mild age-related atrophy and chronic microvascular ischemic changes. There is no acute intracranial hemorrhage. No mass effect or midline shift. No extra-axial fluid collection Vascular: No hyperdense vessel or unexpected calcification. Skull: Normal. Negative for fracture or focal lesion. Sinuses/Orbits: No acute finding. Left scleral band noted. Other: A 2.1 x 1.3 cm heterogeneous lesion in the subcutaneous soft tissues of the right occipital appears similar or slightly larger compared to the prior CT. This is not characterized but may represent a complex  sebaceous cyst or a fat containing lesion. The relative stability over the course of 5 years suggests a benign or indolent process. IMPRESSION: 1. No acute intracranial hemorrhage. 2. Mild age-related atrophy and chronic microvascular ischemic changes. Electronically Signed   By: Anner Crete M.D.   On: 10/15/2018 20:52   Dg Chest Port 1 View  Result Date: 10/15/2018 CLINICAL DATA:  Shortness of breath. EXAM: PORTABLE CHEST 1 VIEW COMPARISON:  Radiograph 04/27/2018, report from chest CT 02/10/2018, images not available FINDINGS: Right-sided pacemaker in place. Cardiomegaly is unchanged. Unchanged mediastinal contours with aortic atherosclerosis and tortuosity. Mild vascular congestion without overt edema. Possible small left pleural effusion. No confluent airspace disease. No pneumothorax. No acute osseous abnormalities are seen. IMPRESSION: Cardiomegaly with vascular congestion. Possible small left pleural effusion. Electronically Signed   By: Keith Rake M.D.   On: 10/15/2018 20:02    Pending Labs Unresulted Labs (From admission, onward)    Start     Ordered   10/15/18 2230  SARS CORONAVIRUS 2 Nasal Swab Aptima Multi Swab  (Asymptomatic/Tier 2)  Once,   STAT    Question Answer Comment  Is this test for diagnosis or screening Screening   Symptomatic for COVID-19 as defined by CDC No   Hospitalized for COVID-19 No   Admitted to ICU for COVID-19 No   Previously tested for COVID-19 No   Resident in a congregate (group) care setting No   Employed in healthcare setting No      10/15/18 2230   10/15/18 1912  Urinalysis, Routine w reflex microscopic  (ED ALOC)  Once,   STAT     10/15/18 1914   Signed and Held  CBC  (heparin)  Once,   R    Comments: Baseline for heparin  therapy IF NOT ALREADY DRAWN.  Notify MD if PLT < 100 K.    Signed and Held   Signed and Held  Creatinine, serum  (heparin)  Once,   R    Comments: Baseline for heparin therapy IF NOT ALREADY DRAWN.    Signed and Held    Signed and Held  Magnesium  Once,   R     Signed and Held   Signed and Held  Phosphorus  Once,   R     Signed and Held   Signed and Held  TSH  Once,   R     Signed and Held   Signed and Held  Basic metabolic panel  Tomorrow morning,   R     Signed and Held   Signed and Held  Comprehensive metabolic panel  Tomorrow morning,   R     Signed and Held          Vitals/Pain Today's Vitals   10/15/18 2106 10/15/18 2200 10/15/18 2252 10/15/18 2300  BP:  (!) 195/86 (!) 153/114 (!) 179/85  Pulse: 77  74   Resp: 17 19 (!) 24 15  Temp:      TempSrc:      SpO2: 100%  99%   Weight:      Height:      PainSc:   0-No pain     Isolation Precautions No active isolations  Medications Medications  amLODipine (NORVASC) tablet 10 mg (10 mg Oral Given 10/15/18 2108)  atenolol (TENORMIN) tablet 25 mg (25 mg Oral Given 10/15/18 2108)  labetalol (NORMODYNE) injection 10 mg (10 mg Intravenous Given 10/15/18 2106)  hydrALAZINE (APRESOLINE) injection 10 mg (10 mg Intravenous Given 10/15/18 2122)    Mobility walks with device Moderate fall risk   Focused Assessments Pt being admitted for Hypertensive Encephalopathy   R Recommendations: See Admitting Provider Note  Report given to:   Additional Notes:

## 2018-10-15 NOTE — ED Notes (Signed)
Daughter, Haris Baack, [phone]

## 2018-10-15 NOTE — Consult Note (Signed)
NAME:  Sean Best, MRN:  [id number], DOB:  Mar 14, 1933, LOS: 0 ADMISSION DATE:  10/15/2018, CONSULTATION DATE: 10/15/2018 REFERRING MD:  Darl Householder, CHIEF COMPLAINT:  Confusion, hypertension  Brief History   85 year old male with end-stage renal disease on dialysis brought to the emergency room with hypertensive urgency confusion and tremulousness.  History of present illness   Patient is a 85 year old male with a history of end-stage renal disease on dialysis.  He takes Norvasc and Tenormin at home.  Been a question as to whether or not he has been compliant.  He insists he has but I do think there is probably some confusion as to the events of the last 24 hours.  On arrival to the emergency room he was described by nursing as being very tremulous particularly of the upper extremities.  His blood pressure was 128/154.  This was controlled with 10 mg IV hydralazine, 10 mg of atenolol IV, along with a follow-up dose of 10 mg Norvasc 10 mg Tenormin which are his routine home medications.  My evaluation is at 9:45 PM the patient is easily arousable fully awake not tremulous at this time.  He is oriented x3.  We had a lengthy conversation as to his medical history he is quite clear his answers are consistent with documentation on his medical record.  His blood pressure on my arrival is 146/100.  Past Medical History   . Anemia   . Arthritis    "knees" (04/11/2016)  . Blind left eye    S/P trauma  . Chronic total occlusion of artery of the extremities (HCC)    pt not aware of this  . Claudication ([place])   . ESRD on dialysis [facility])    "MWF; [name]" ((04/10/2016)  . ESRD on peritoneal dialysis [facility])    Started dialysis around April 2015 per son.  Has been doing peritoneal dialysis at home.     . Gout   . Hyperlipidemia   . Hypertension   . Overweight(278.02)   . Presence of permanent cardiac pacemaker    medtronic  . Prostate cancer ([place]) 1990s  . Shingles   . Stroke [facility])  ~1998   denies residual on 04/11/2016  . Type II diabetes mellitus ([place])    diet controlled     Significant Hospital Events     Consults:    Procedures:  None  Significant Diagnostic Tests:  Normal CT scan of the brain  Micro Data:  None  Antimicrobials:  None indicated  Interim history/subjective:  NA  Objective   Blood pressure (!) 153/114, pulse 77, temperature 98.2 F (36.8 C), temperature source Oral, resp. rate 17, height 5\' 5"  (1.651 m), weight 86.6 kg, SpO2 100 %.       No intake or output data in the 24 hours ending 10/15/18 2143 Filed Weights   10/15/18 1805  Weight: 86.6 kg    Examination: General: Well-developed male appears stated age HENT: Within normal limits Lungs: Clear Cardiovascular: 2/4 systolic ejection murmur Abdomen: Rotund benign nontender bowel sounds positive Extremities: Within normal limits Neuro: Within normal limits GU: N/A  Resolved Hospital Problem list   na  Assessment & Plan:  1.  Hypertensive urgency easily controlled in the emergency room.  Patient's been restarted on his home medications.  No evidence of encephalopathy is resolved with blood pressure control.  No need for care in the ICU.  Will need observation in-house on the floor for 24 to 48 hours to ensure blood pressure control and no  return of encephalopathic features.  2.  End-stage renal disease.  Patient is Monday Wednesday Friday dialysis    Best practice:  Diet: Low-sodium Pain/Anxiety/Delirium protocol (if indicated): N/A VAP protocol (if indicated): N/A DVT prophylaxis: N/A GI prophylaxis: N/A Glucose control: Monitor Mobility: Bedrest Code Status: Full Family Communication: N/A Disposition: Recommend observation on the floor.  Patient is received a dose of his home medications orally with good blood pressure control.  He has no evidence of encephalopathy at this point time along with a normal CT scan of the brain  Labs   CBC: Recent Labs   Lab 10/15/18 1941  HGB 13.3  HCT 62.9    Basic Metabolic Panel: Recent Labs  Lab 10/15/18 1932 10/15/18 1941  NA 140 138  K 4.9 5.2*  CL 98 100  CO2 27  --   GLUCOSE 113* 104*  BUN 33* 45*  CREATININE 8.09* 8.30*  CALCIUM 9.4  --    GFR: Estimated Creatinine Clearance: 6.8 mL/min (A) (by C-G formula based on SCr of 8.3 mg/dL (H)). No results for input(s): PROCALCITON, WBC, LATICACIDVEN in the last 168 hours.  Liver Function Tests: Recent Labs  Lab 10/15/18 1932  AST 14*  ALT 13  ALKPHOS 68  BILITOT 0.6  PROT 7.1  ALBUMIN 3.5   No results for input(s): LIPASE, AMYLASE in the last 168 hours. Recent Labs  Lab 10/15/18 1932  AMMONIA 24    ABG    Component Value Date/Time   PHART 7.271 (L) 04/18/2017 0215   PCO2ART 41.2 04/18/2017 0215   PO2ART 105 04/18/2017 0215   HCO3 18.3 (L) 04/18/2017 0215   TCO2 31 10/15/2018 1941   ACIDBASEDEF 7.3 (H) 04/18/2017 0215   O2SAT 96.3 04/18/2017 0215     Coagulation Profile: No results for input(s): INR, PROTIME in the last 168 hours.  Cardiac Enzymes: Recent Labs  Lab 10/15/18 1932  CKTOTAL 103    HbA1C: Hgb A1c MFr Bld  Date/Time Value Ref Range Status  11/25/2017 01:58 PM 5.6 4.8 - 5.6 % Final    Comment:    (NOTE) Pre diabetes:          5.7%-6.4% Diabetes:              >6.4% Glycemic control for   <7.0% adults with diabetes   04/11/2016 11:51 AM 5.5 4.8 - 5.6 % Final    Comment:    (NOTE)         Pre-diabetes: 5.7 - 6.4         Diabetes: >6.4         Glycemic control for adults with diabetes: <7.0     CBG: Recent Labs  Lab 10/15/18 1854  GLUCAP 120*    Review of Systems:   Questioned 14 points, neg except as above  Past Medical History  He,  has a past medical history of Anemia, Arthritis, Blind left eye, Chronic total occlusion of artery of the extremities ([place]), Claudication ([place]), ESRD on dialysis ([place]), ESRD on peritoneal dialysis ([place]), Gout, Hyperlipidemia, Hypertension,  Overweight(278.02), Presence of permanent cardiac pacemaker, Prostate cancer ([place]) (1990s), Shingles, Stroke ([place]) (~1998), Type II diabetes mellitus ([place]), and Umbilical hernia (4/76/5465).   Surgical History    Past Surgical History:  Procedure Laterality Date  . AV FISTULA PLACEMENT, RADIOCEPHALIC  03/54/6568   Left arm  . CAPD INSERTION N/A 03/06/2013   Procedure: LAPAROSCOPIC INSERTION CONTINUOUS AMBULATORY PERITONEAL DIALYSIS  (CAPD) CATHETER;  Surgeon: Edward Jolly, MD;  Location: [place];  Service: General;  Laterality: N/A;  . CATARACT EXTRACTION Right   . CHOLECYSTECTOMY  12/02/2017   LAPROSCOPIC  . CORONARY STENT INTERVENTION N/A 02/11/2018   Procedure: CORONARY STENT INTERVENTION;  Surgeon: Sherren Mocha, MD;  Location: [place] CV LAB;  Service: Cardiovascular;  Laterality: N/A;  . EYE SURGERY Left 1970s   for eye injury  . HERNIA REPAIR    . INSERTION PROSTATE RADIATION SEED    . LAPAROSCOPIC CHOLECYSTECTOMY SINGLE SITE WITH INTRAOPERATIVE CHOLANGIOGRAM N/A 12/02/2017   Procedure: LAPAROSCOPIC CHOLECYSTECTOMY;  Surgeon: Michael Boston, MD;  Location: [place];  Service: General;  Laterality: N/A;  . LEFT HEART CATH AND CORONARY ANGIOGRAPHY N/A 02/11/2018   Procedure: LEFT HEART CATH AND CORONARY ANGIOGRAPHY;  Surgeon: Sherren Mocha, MD;  Location: [place] CV LAB;  Service: Cardiovascular;  Laterality: N/A;  . PACEMAKER IMPLANT N/A 04/20/2017   Procedure: PACEMAKER IMPLANT;  Surgeon: Evans Lance, MD;  Location: [place] CV LAB;  Service: Cardiovascular;  Laterality: N/A;  . UMBILICAL HERNIA REPAIR N/A 03/06/2013   Procedure: HERNIA REPAIR UMBILICAL WITH MESH;  Surgeon: Edward Jolly, MD;  Location: [place];  Service: General;  Laterality: N/A;     Social History   reports that he has never smoked. He has never used smokeless tobacco. He reports that he does not drink alcohol or use drugs.   Family History   His family history includes Cancer in his  father and mother; Heart disease in his mother.   Allergies Allergies  Allergen Reactions  . Cardura [Doxazosin Mesylate] Other (See Comments)    Hallucinations  . Tape     Paper tape     Home Medications  Prior to Admission medications   Medication Sig Start Date End Date Taking? Authorizing Provider  allopurinol (ZYLOPRIM) 100 MG tablet Take 100 mg by mouth daily.      [provider]  amLODipine (NORVASC) 10 MG tablet Take 10 mg by mouth daily. 05/15/18   [provider]  aspirin 81 MG chewable tablet Chew 1 tablet (81 mg total) by mouth daily. 02/14/18   Desiree Hane, MD  atenolol (TENORMIN) 25 MG tablet Take one tablet by mouth daily on NON HEMODIALYSIS days only 07/12/18   Evans Lance, MD  atorvastatin (LIPITOR) 40 MG tablet Take 40 mg by mouth every evening.  07/07/17   [provider]  calcium acetate (PHOSLO) 667 MG capsule Take 3 capsules (2,001 mg total) by mouth 3 (three) times daily with meals. 04/30/18   Nita Sells, MD  Cholecalciferol (VITAMIN D-3) 1000 units CAPS Take 1,000 Units by mouth daily.    [provider]  Cinacalcet HCl (SENSIPAR PO) Take by mouth every Monday, Wednesday, and Friday.    [provider]  escitalopram (LEXAPRO) 10 MG tablet Take 10 mg by mouth daily. 04/06/18   [provider]  ferrous sulfate 325 (65 FE) MG EC tablet Take 325 mg by mouth 2 (two) times daily with a meal.     [provider]  gabapentin (NEURONTIN) 100 MG capsule Take 100 mg by mouth at bedtime. 12/27/16   [provider]  isosorbide mononitrate (IMDUR) 30 MG 24 hr tablet Take 1 tablet (30 mg total) by mouth daily. Patient taking differently: Take 30 mg by mouth 2 (two) times daily. Taking on [place], Sat and Sunday 02/14/18   Oretha Milch D, MD  lidocaine-prilocaine (EMLA) cream Apply 1 application topically as needed (for port access).  10/28/17   [provider]  methocarbamol  (ROBAXIN) 500 MG tablet Take 500 mg by mouth 3 (three) times daily as needed. 02/22/18   [provider]  multivitamin (RENA-VIT) TABS tablet Take 1 tablet by mouth daily.    [provider]  nitroGLYCERIN (NITROSTAT) 0.4 MG SL tablet PLEASE SEE ATTACHED FOR DETAILED DIRECTIONS 06/17/18   Leonie Man, MD  pantoprazole (PROTONIX) 40 MG tablet Take 1 tablet (40 mg total) by mouth 2 (two) times daily. Patient taking differently: Take 40 mg by mouth daily.  02/13/18 04/27/18  Oretha Milch D, MD  ticagrelor (BRILINTA) 90 MG TABS tablet Take 1 tablet (90 mg total) by mouth 2 (two) times daily. 02/13/18   Desiree Hane, MD  VELPHORO 500 MG chewable tablet Chew 500 mg by mouth 3 (three) times daily as needed (to break down phosphoru--swhen not taking the phoslo).  01/28/18   [provider]     Critical care time: 35 minutes spent in evaluation the patient chart review bedside evaluation discussing with caregivers in the emergency room.

## 2018-10-15 NOTE — ED Notes (Signed)
Please call pt's daughter Johndavid Geralds at [phone] or Lawson Radar (son in law) at [phone] to give them an update on patient.

## 2018-10-15 NOTE — ED Triage Notes (Signed)
Per [facility] pt reports waking up around 5:15 this afternoon feeling lethargic and having tremors in both arms. Patient states he slept all day, has not taken any medications. Had full dialysis treatment yesterday.

## 2018-10-15 NOTE — ED Notes (Signed)
Pt to CAT scan

## 2018-10-15 NOTE — H&P (Addendum)
History and Physical  Sean Best LKT:[id number] DOB: 14-Mar-1933 DOA: 10/15/2018  Referring physician: ER provider, Dr. Shirlyn Goltz PCP: Seward Carol, MD  Outpatient Specialists: Nephrology Patient coming from: Home  Chief Complaint: Elevated blood pressure (228/132 mmHg) and confusion  HPI:  Patient is an 85 year old African-American male with past medical history significant for end-stage renal disease on hemodialysis, diabetes mellitus type II, CVA, prostate cancer hypertension and hyperlipidemia.  Patient was noted to have developed altered mentation with associated tremors/shakes of the upper extremity earlier today.  The patient was brought to the hospital for further assessment and management.  On presentation to the hospital, patient's blood pressure was noted to be 228/132 mmHg.  Hospitalist team was asked to admit patient for further assessment and management of hypertensive encephalopathy after the ER team liaised with the ICU team as Hypertensive emergency is usually managed in an ICU setting.  ER provider, Dr. Darl Householder, managed the patient with IV labetalol and IV hydralazine and patient's blood pressure was reported to have dropped to 153/114 mmHg within 1 hour.  Patient is not able to give any significant history, and the limited history from the patient was not coherent.  ED Course: Patient presented to the emergency room with diagnosis of hypertensive encephalopathy.  Hospitalist team was asked to admit patient after blood pressure was dropped to 153/114 mmHg.  Pertinent labs: Chemistry reveals sodium of 140, potassium of 4.9, chloride 98, CO2 27, BUN of 33, creatinine of 8.09.  Ammonia is 24.  Total CPK was 130 and high sensitive troponin was 59.  Hemoglobin was 13.3 (baseline hemoglobin is around 8.9 g/dL) and hematocrit was 39.  EKG: Independently reviewed.  QTC of 514 ms was noted.  Imaging: independently reviewed.  Head without contrast has not shown any acute abnormality.  Chest  x-ray revealed "Cardiomegaly with vascular congestion. Possible small left pleural Effusion".  Review of Systems:  Patient cannot give any significant history.   Past Medical History:  Diagnosis Date   Anemia    Arthritis    "knees" (04/11/2016)   Blind left eye    S/P trauma   Chronic total occlusion of artery of the extremities [facility])    pt not aware of this   Claudication [facility])    ESRD on dialysis [facility])    "MWF; [name]" ((04/10/2016)   ESRD on peritoneal dialysis [facility])    Started dialysis around April 2015 per son.  Has been doing peritoneal dialysis at home.      Gout    Hyperlipidemia    Hypertension    Overweight(278.02)    Presence of permanent cardiac pacemaker    medtronic   Prostate cancer ([place]) 1990s   Shingles    Stroke [facility]) ~1998   denies residual on 04/11/2016   Type II diabetes mellitus ([place])    diet controlled   Umbilical hernia 3/42/8768    Past Surgical History:  Procedure Laterality Date   AV FISTULA PLACEMENT, RADIOCEPHALIC  11/57/2620   Left arm   CAPD INSERTION N/A 03/06/2013   Procedure: LAPAROSCOPIC INSERTION CONTINUOUS AMBULATORY PERITONEAL DIALYSIS  (CAPD) CATHETER;  Surgeon: Edward Jolly, MD;  Location: [place];  Service: General;  Laterality: N/A;   CATARACT EXTRACTION Right    CHOLECYSTECTOMY  12/02/2017   LAPROSCOPIC   CORONARY STENT INTERVENTION N/A 02/11/2018   Procedure: CORONARY STENT INTERVENTION;  Surgeon: Sherren Mocha, MD;  Location: [place] CV LAB;  Service: Cardiovascular;  Laterality: N/A;   EYE SURGERY Left 1970s   for  eye injury   HERNIA REPAIR     INSERTION PROSTATE RADIATION SEED     LAPAROSCOPIC CHOLECYSTECTOMY SINGLE SITE WITH INTRAOPERATIVE CHOLANGIOGRAM N/A 12/02/2017   Procedure: LAPAROSCOPIC CHOLECYSTECTOMY;  Surgeon: Michael Boston, MD;  Location: [place];  Service: General;  Laterality: N/A;   LEFT HEART CATH AND CORONARY ANGIOGRAPHY N/A 02/11/2018   Procedure: LEFT HEART CATH  AND CORONARY ANGIOGRAPHY;  Surgeon: Sherren Mocha, MD;  Location: [place] CV LAB;  Service: Cardiovascular;  Laterality: N/A;   PACEMAKER IMPLANT N/A 04/20/2017   Procedure: PACEMAKER IMPLANT;  Surgeon: Evans Lance, MD;  Location: [place] CV LAB;  Service: Cardiovascular;  Laterality: N/A;   UMBILICAL HERNIA REPAIR N/A 03/06/2013   Procedure: HERNIA REPAIR UMBILICAL WITH MESH;  Surgeon: Edward Jolly, MD;  Location: [place];  Service: General;  Laterality: N/A;     reports that he has never smoked. He has never used smokeless tobacco. He reports that he does not drink alcohol or use drugs.  Allergies  Allergen Reactions   Cardura [Doxazosin Mesylate] Other (See Comments)    Hallucinations   Tape     Paper tape    Family History  Problem Relation Age of Onset   Cancer Mother    Heart disease Mother    Cancer Father      Prior to Admission medications   Medication Sig Start Date End Date Taking? Authorizing Provider  allopurinol (ZYLOPRIM) 100 MG tablet Take 100 mg by mouth daily.      [provider]  amLODipine (NORVASC) 10 MG tablet Take 10 mg by mouth daily. 05/15/18   [provider]  aspirin 81 MG chewable tablet Chew 1 tablet (81 mg total) by mouth daily. 02/14/18   Desiree Hane, MD  atenolol (TENORMIN) 25 MG tablet Take one tablet by mouth daily on NON HEMODIALYSIS days only 07/12/18   Evans Lance, MD  atorvastatin (LIPITOR) 40 MG tablet Take 40 mg by mouth every evening.  07/07/17   [provider]  calcium acetate (PHOSLO) 667 MG capsule Take 3 capsules (2,001 mg total) by mouth 3 (three) times daily with meals. 04/30/18   Nita Sells, MD  Cholecalciferol (VITAMIN D-3) 1000 units CAPS Take 1,000 Units by mouth daily.    [provider]  Cinacalcet HCl (SENSIPAR PO) Take by mouth every Monday, Wednesday, and Friday.    [provider]  escitalopram (LEXAPRO) 10 MG tablet Take 10 mg by mouth daily.  04/06/18   [provider]  ferrous sulfate 325 (65 FE) MG EC tablet Take 325 mg by mouth 2 (two) times daily with a meal.     [provider]  gabapentin (NEURONTIN) 100 MG capsule Take 100 mg by mouth at bedtime. 12/27/16   [provider]  isosorbide mononitrate (IMDUR) 30 MG 24 hr tablet Take 1 tablet (30 mg total) by mouth daily. Patient taking differently: Take 30 mg by mouth 2 (two) times daily. Taking on Cain Saupe, Sat and Sunday 02/14/18   Oretha Milch D, MD  lidocaine-prilocaine (EMLA) cream Apply 1 application topically as needed (for port access).  10/28/17   [provider]  methocarbamol (ROBAXIN) 500 MG tablet Take 500 mg by mouth 3 (three) times daily as needed. 02/22/18   [provider]  multivitamin (RENA-VIT) TABS tablet Take 1 tablet by mouth daily.    [provider]  nitroGLYCERIN (NITROSTAT) 0.4 MG SL tablet PLEASE SEE ATTACHED FOR DETAILED DIRECTIONS 06/17/18   Glenetta Hew  W, MD  pantoprazole (PROTONIX) 40 MG tablet Take 1 tablet (40 mg total) by mouth 2 (two) times daily. Patient taking differently: Take 40 mg by mouth daily.  02/13/18 04/27/18  Oretha Milch D, MD  ticagrelor (BRILINTA) 90 MG TABS tablet Take 1 tablet (90 mg total) by mouth 2 (two) times daily. 02/13/18   Desiree Hane, MD  VELPHORO 500 MG chewable tablet Chew 500 mg by mouth 3 (three) times daily as needed (to break down phosphoru--swhen not taking the phoslo).  01/28/18   [provider]    Physical Exam: Vitals:   10/15/18 1900 10/15/18 2000 10/15/18 2105 10/15/18 2106  BP: (!) 224/116 (!) 202/113 (!) 153/114   Pulse:  74  77  Resp: 18 20 11 17   Temp:      TempSrc:      SpO2:  99%  100%  Weight:      Height:        Constitutional:   Appears calm and comfortable.  Obese.  Patient has left upper extremity AV fistula with adequate thrill but aneurysmal. Eyes:   No pallor. No jaundice.  ENMT:   external ears, nose  appear normal Neck:   Neck is supple. No JVD Respiratory:   Clear to auscultation. Cardiovascular:   S1S2  Minimal ankle edema.    Abdomen:   Abdomen is obese, soft and non tender. Organs are difficult to assess. Neurologic:   Awake and alert.  Moves all limbs.  Wt Readings from Last 3 Encounters:  10/15/18 86.6 kg  08/22/18 86.6 kg  06/28/18 87.1 kg    I have personally reviewed following labs and imaging studies  Labs on Admission:  CBC: Recent Labs  Lab 10/15/18 1941  HGB 13.3  HCT 35.3   Basic Metabolic Panel: Recent Labs  Lab 10/15/18 1932 10/15/18 1941  NA 140 138  K 4.9 5.2*  CL 98 100  CO2 27  --   GLUCOSE 113* 104*  BUN 33* 45*  CREATININE 8.09* 8.30*  CALCIUM 9.4  --    Liver Function Tests: Recent Labs  Lab 10/15/18 1932  AST 14*  ALT 13  ALKPHOS 68  BILITOT 0.6  PROT 7.1  ALBUMIN 3.5   No results for input(s): LIPASE, AMYLASE in the last 168 hours. Recent Labs  Lab 10/15/18 1932  AMMONIA 24   Coagulation Profile: No results for input(s): INR, PROTIME in the last 168 hours. Cardiac Enzymes: Recent Labs  Lab 10/15/18 1932  CKTOTAL 103   BNP (last 3 results) No results for input(s): PROBNP in the last 8760 hours. HbA1C: No results for input(s): HGBA1C in the last 72 hours. CBG: Recent Labs  Lab 10/15/18 1854  GLUCAP 120*   Lipid Profile: No results for input(s): CHOL, HDL, LDLCALC, TRIG, CHOLHDL, LDLDIRECT in the last 72 hours. Thyroid Function Tests: No results for input(s): TSH, T4TOTAL, FREET4, T3FREE, THYROIDAB in the last 72 hours. Anemia Panel: No results for input(s): VITAMINB12, FOLATE, FERRITIN, TIBC, IRON, RETICCTPCT in the last 72 hours. Urine analysis:    Component Value Date/Time   COLORURINE YELLOW 12/29/2014 0550   APPEARANCEUR CLOUDY (A) 12/29/2014 0550   LABSPEC 1.020 12/29/2014 0550   PHURINE 5.0 12/29/2014 0550   GLUCOSEU NEGATIVE 12/29/2014 0550   HGBUR SMALL (A) 12/29/2014 0550    BILIRUBINUR NEGATIVE 12/29/2014 0550   KETONESUR NEGATIVE 12/29/2014 0550   PROTEINUR 100 (A) 12/29/2014 0550   UROBILINOGEN 0.2 12/29/2014 0550   NITRITE NEGATIVE 12/29/2014 0550   LEUKOCYTESUR  SMALL (A) 12/29/2014 0550   Sepsis Labs: @LABRCNTIP (procalcitonin:4,lacticidven:4) )No results found for this or any previous visit (from the past 240 hour(s)).    Radiological Exams on Admission: Ct Head Wo Contrast  Result Date: 10/15/2018 CLINICAL DATA:  85 year old male with altered mental status. EXAM: CT HEAD WITHOUT CONTRAST TECHNIQUE: Contiguous axial images were obtained from the base of the skull through the vertex without intravenous contrast. COMPARISON:  Head CT dated 12/09/2013 FINDINGS: Brain: There is mild age-related atrophy and chronic microvascular ischemic changes. There is no acute intracranial hemorrhage. No mass effect or midline shift. No extra-axial fluid collection Vascular: No hyperdense vessel or unexpected calcification. Skull: Normal. Negative for fracture or focal lesion. Sinuses/Orbits: No acute finding. Left scleral band noted. Other: A 2.1 x 1.3 cm heterogeneous lesion in the subcutaneous soft tissues of the right occipital appears similar or slightly larger compared to the prior CT. This is not characterized but may represent a complex sebaceous cyst or a fat containing lesion. The relative stability over the course of 5 years suggests a benign or indolent process. IMPRESSION: 1. No acute intracranial hemorrhage. 2. Mild age-related atrophy and chronic microvascular ischemic changes. Electronically Signed   By: Anner Crete M.D.   On: 10/15/2018 20:52   Dg Chest Port 1 View  Result Date: 10/15/2018 CLINICAL DATA:  Shortness of breath. EXAM: PORTABLE CHEST 1 VIEW COMPARISON:  Radiograph 04/27/2018, report from chest CT 02/10/2018, images not available FINDINGS: Right-sided pacemaker in place. Cardiomegaly is unchanged. Unchanged mediastinal contours with aortic  atherosclerosis and tortuosity. Mild vascular congestion without overt edema. Possible small left pleural effusion. No confluent airspace disease. No pneumothorax. No acute osseous abnormalities are seen. IMPRESSION: Cardiomegaly with vascular congestion. Possible small left pleural effusion. Electronically Signed   By: Keith Rake M.D.   On: 10/15/2018 20:02    EKG: Independently reviewed.   Active Problems:   * No active hospital problems. *   Assessment/Plan Hypertensive encephalopathy: Patient should be ideally managed in an ICU setting (see above) We will have to admit patient to progressive unit based on above. Cautious control of blood pressure. Continue to monitor closely. Other management will depend on hospital course.  ESRD on hemodialysis: Consult nephrology in the morning.  Prolonged QTc interval: Repeat EKG in the morning. Check magnesium and correct if low. Avoid QTC prolonging medications whenever possible  DVT prophylaxis: Subcutaneous heparin Code Status: Full code Family Communication:  Disposition Plan: This will depend on hospital course Consults called: Please consult nephrology in the morning Admission status: Inpatient  Time spent: 60 minutes  Dana Allan, MD  [facility] Pager #: [phone] 7PM-7AM contact night coverage as above  10/15/2018, 10:29 PM

## 2018-10-15 NOTE — ED Notes (Addendum)
Pt given urinal. Aware that urine sample is needed, not able to go at this time.

## 2018-10-15 NOTE — ED Provider Notes (Signed)
Medical screening examination/treatment/procedure(s) were conducted as a shared visit with non-physician practitioner(s) and myself.  I personally evaluated the patient during the encounter.    84 year-old male who presents with tremors in bilateral upper extremities which began today.  Denies any history of injury.  He is a dialysis patient here on exam does have some upper extremity tremors he is awake and alert.  Suspect electrolyte abnormality versus some type of intracranial process.  Will perform head CT, check electrolytes, control patient's blood pressure.   Lacretia Leigh, MD 10/15/18 Lurline Hare

## 2018-10-15 NOTE — ED Provider Notes (Signed)
[place] EMERGENCY DEPARTMENT Provider Note   CSN: [id number] Arrival date & time: 10/15/18  1754     History   Chief Complaint Chief Complaint  Patient presents with  . Tremors    HPI Sean Best is a 85 y.o. male who presents emergency department chief complaint of shaking.  He has a past medical history of end-stage renal disease, peripheral arterial disease, hyperlipidemia, hypertension, obesity, type 2 diabetes, and cardiac pace maker placement.  Patient presents with chief complaint of shaking.  He is somewhat confused making history unreliable and there is a level 5 caveat.  The patient states that he slept all day.  He last dialyzed yesterday and that when he awoke he was confused as to what day it was and what time it was so he called his granddaughter.  She told him that he had been sleeping all day and that it was Saturday.  He says that he was extremely shaky all over.  He says he is never had shaking like this before.  Patient is somewhat confused during the session and will frequently repeat things that he had just said.  He claims to have taken his medications today however review of EMR states that EMS reports he did not take any of his medicines.  He is notably hypertensive.  He denies chest pain, shortness of breath, severe headache.     HPI  Past Medical History:  Diagnosis Date  . Anemia   . Arthritis    "knees" (04/11/2016)  . Blind left eye    S/P trauma  . Chronic total occlusion of artery of the extremities (HCC)    pt not aware of this  . Claudication ([place])   . ESRD on dialysis [facility])    "MWF; [name]" ((04/10/2016)  . ESRD on peritoneal dialysis [facility])    Started dialysis around April 2015 per son.  Has been doing peritoneal dialysis at home.     . Gout   . Hyperlipidemia   . Hypertension   . Overweight(278.02)   . Presence of permanent cardiac pacemaker    medtronic  . Prostate cancer ([place]) 1990s  . Shingles   . Stroke  [facility]) ~1998   denies residual on 04/11/2016  . Type II diabetes mellitus (HCC)    diet controlled  . Umbilical hernia 9/83/3825    Patient Active Problem List   Diagnosis Date Noted  . Left bundle branch block (LBBB) determined by electrocardiography 02/13/2018  . Aortic stenosis 02/13/2018  . NSTEMI (non-ST elevated myocardial infarction) ([place]) 02/13/2018  . Constipation 12/08/2017  . Abdominal pain 12/06/2017  . Leukocytosis 12/06/2017  . Gout 12/06/2017  . Chronic pain 12/06/2017  . Acute on chronic cholecystitis s/p lap cholecystectomy 12/02/2017 12/02/2017  . Cardiac pacemaker in situ (Medtronic DDD) 12/02/2017  . Acute hypoxemic respiratory failure ([place]) 04/18/2017  . Heart block 04/17/2017  . Elevated troponin 04/17/2017  . ESRD on hemodialysis ([place]) 04/12/2016  . H/O: stroke 04/11/2016  . Saccular aneurysm of infrarenal aorta with thrombosed dissection of abdominal aorta 04/11/2016  . Obesity (BMI 30-39.9) 04/11/2016  . Syncope 04/11/2016  . Prostate CA ([place]) 12/28/2014  . Chest pain 12/28/2014  . C. difficile colitis 10/04/2014  . Hyperkalemia 03/11/2013  . Anemia 03/11/2013  . DM (diabetes mellitus), type 2 with renal complications, diet controlled 03/11/2013  . HTN (hypertension) 03/11/2013  . Hyperlipidemia 03/11/2013  . ESRD on dialysis [facility]) 03/20/2011    Past Surgical History:  Procedure Laterality Date  .  AV FISTULA PLACEMENT, RADIOCEPHALIC  60/11/9321   Left arm  . CAPD INSERTION N/A 03/06/2013   Procedure: LAPAROSCOPIC INSERTION CONTINUOUS AMBULATORY PERITONEAL DIALYSIS  (CAPD) CATHETER;  Surgeon: Edward Jolly, MD;  Location: [place];  Service: General;  Laterality: N/A;  . CATARACT EXTRACTION Right   . CHOLECYSTECTOMY  12/02/2017   LAPROSCOPIC  . CORONARY STENT INTERVENTION N/A 02/11/2018   Procedure: CORONARY STENT INTERVENTION;  Surgeon: Sherren Mocha, MD;  Location: [place] CV LAB;  Service: Cardiovascular;  Laterality: N/A;  . EYE SURGERY  Left 1970s   for eye injury  . HERNIA REPAIR    . INSERTION PROSTATE RADIATION SEED    . LAPAROSCOPIC CHOLECYSTECTOMY SINGLE SITE WITH INTRAOPERATIVE CHOLANGIOGRAM N/A 12/02/2017   Procedure: LAPAROSCOPIC CHOLECYSTECTOMY;  Surgeon: Michael Boston, MD;  Location: [place];  Service: General;  Laterality: N/A;  . LEFT HEART CATH AND CORONARY ANGIOGRAPHY N/A 02/11/2018   Procedure: LEFT HEART CATH AND CORONARY ANGIOGRAPHY;  Surgeon: Sherren Mocha, MD;  Location: [place] CV LAB;  Service: Cardiovascular;  Laterality: N/A;  . PACEMAKER IMPLANT N/A 04/20/2017   Procedure: PACEMAKER IMPLANT;  Surgeon: Evans Lance, MD;  Location: [place] CV LAB;  Service: Cardiovascular;  Laterality: N/A;  . UMBILICAL HERNIA REPAIR N/A 03/06/2013   Procedure: HERNIA REPAIR UMBILICAL WITH MESH;  Surgeon: Edward Jolly, MD;  Location[facility];  Service: General;  Laterality: N/A;        Home Medications    Prior to Admission medications   Medication Sig Start Date End Date Taking? Authorizing Provider  allopurinol (ZYLOPRIM) 100 MG tablet Take 100 mg by mouth daily.      [provider]  amLODipine (NORVASC) 10 MG tablet Take 10 mg by mouth daily. 05/15/18   [provider]  aspirin 81 MG chewable tablet Chew 1 tablet (81 mg total) by mouth daily. 02/14/18   Desiree Hane, MD  atenolol (TENORMIN) 25 MG tablet Take one tablet by mouth daily on NON HEMODIALYSIS days only 07/12/18   Evans Lance, MD  atorvastatin (LIPITOR) 40 MG tablet Take 40 mg by mouth every evening.  07/07/17   [provider]  calcium acetate (PHOSLO) 667 MG capsule Take 3 capsules (2,001 mg total) by mouth 3 (three) times daily with meals. 04/30/18   Nita Sells, MD  Cholecalciferol (VITAMIN D-3) 1000 units CAPS Take 1,000 Units by mouth daily.    [provider]  Cinacalcet HCl (SENSIPAR PO) Take by mouth every Monday, Wednesday, and Friday.    [provider]  escitalopram  (LEXAPRO) 10 MG tablet Take 10 mg by mouth daily. 04/06/18   [provider]  ferrous sulfate 325 (65 FE) MG EC tablet Take 325 mg by mouth 2 (two) times daily with a meal.     [provider]  gabapentin (NEURONTIN) 100 MG capsule Take 100 mg by mouth at bedtime. 12/27/16   [provider]  isosorbide mononitrate (IMDUR) 30 MG 24 hr tablet Take 1 tablet (30 mg total) by mouth daily. Patient taking differently: Take 30 mg by mouth 2 (two) times daily. Taking on Cain Saupe, Sat and Sunday 02/14/18   Oretha Milch D, MD  lidocaine-prilocaine (EMLA) cream Apply 1 application topically as needed (for port access).  10/28/17   [provider]  methocarbamol (ROBAXIN) 500 MG tablet Take 500 mg by mouth 3 (three) times daily as needed. 02/22/18   [provider]  multivitamin (RENA-VIT) TABS tablet Take 1 tablet by mouth  daily.    [provider]  nitroGLYCERIN (NITROSTAT) 0.4 MG SL tablet PLEASE SEE ATTACHED FOR DETAILED DIRECTIONS 06/17/18   Leonie Man, MD  pantoprazole (PROTONIX) 40 MG tablet Take 1 tablet (40 mg total) by mouth 2 (two) times daily. Patient taking differently: Take 40 mg by mouth daily.  02/13/18 04/27/18  Oretha Milch D, MD  ticagrelor (BRILINTA) 90 MG TABS tablet Take 1 tablet (90 mg total) by mouth 2 (two) times daily. 02/13/18   Desiree Hane, MD  VELPHORO 500 MG chewable tablet Chew 500 mg by mouth 3 (three) times daily as needed (to break down phosphoru--swhen not taking the phoslo).  01/28/18   [provider]    Family History Family History  Problem Relation Age of Onset  . Cancer Mother   . Heart disease Mother   . Cancer Father     Social History Social History   Tobacco Use  . Smoking status: Never Smoker  . Smokeless tobacco: Never Used  Substance Use Topics  . Alcohol use: No  . Drug use: No     Allergies   Cardura [doxazosin mesylate] and Tape   Review of Systems Review of  Systems  Unable to perform ROS: Mental status change     Physical Exam Updated Vital Signs BP (!) 228/132   Pulse 78   Temp 98.2 F (36.8 C) (Oral)   Resp 19   Ht 5\' 5"  (1.651 m)   Wt 86.6 kg   SpO2 99%   BMI 31.78 kg/m   Physical Exam Vitals signs and nursing note reviewed.  Constitutional:      General: He is not in acute distress.    Appearance: He is well-developed. He is not diaphoretic.  HENT:     Head: Normocephalic and atraumatic.  Eyes:     General: No scleral icterus.    Conjunctiva/sclera: Conjunctivae normal.  Neck:     Musculoskeletal: Normal range of motion and neck supple.  Cardiovascular:     Rate and Rhythm: Normal rate and regular rhythm.     Heart sounds: Normal heart sounds.  Pulmonary:     Effort: Pulmonary effort is normal. No respiratory distress.     Breath sounds: Normal breath sounds.  Abdominal:     Palpations: Abdomen is soft.     Tenderness: There is no abdominal tenderness.  Skin:    General: Skin is warm and dry.  Neurological:     Mental Status: He is alert.     Comments: Patient is intermittently somnolent.  Negative Truax Stack sign.  Speech is goal oriented but confused at times. Patient has significant dysmetria with finger-to-nose.  He has no mild clonus in the ankle or feet however with holding his arms he has sudden myoclonic jerking after prolonged periods of holding his arms up.  Does not appear to have any pronator drift.  Psychiatric:        Behavior: Behavior normal.       ED Treatments / Results  Labs (all labs ordered are listed, but only abnormal results are displayed) Labs Reviewed - No data to display  EKG EKG Interpretation  Date/Time:  Saturday October 15 2018 18:02:06 EDT Ventricular Rate:  80 PR Interval:    QRS Duration: 154 QT Interval:  445 QTC Calculation: 514 R Axis:   -45 Text Interpretation:  Sinus rhythm Probable left atrial enlargement RBBB and LAFB BBBB new since previous  Confirmed by Wandra Arthurs [phone]) on 10/15/2018 7:54:02  PM   Radiology No results found.  Procedures .Critical Care Performed by: Margarita Mail, PA-C Authorized by: Margarita Mail, PA-C   Critical care provider statement:    Critical care time (minutes):  45   Critical care was necessary to treat or prevent imminent or life-threatening deterioration of the following conditions:  CNS failure or compromise   Critical care was time spent personally by me on the following activities:  Discussions with consultants, evaluation of patient's response to treatment, examination of patient, ordering and performing treatments and interventions, ordering and review of laboratory studies, ordering and review of radiographic studies, pulse oximetry, re-evaluation of patient's condition, obtaining history from patient or surrogate and review of old charts   (including critical care time)  Medications Ordered in ED Medications - No data to display   Initial Impression / Assessment and Plan / ED Course  I have reviewed the triage vital signs and the nursing notes.  Pertinent labs & imaging results that were available during my care of the patient were reviewed by me and considered in my medical decision making (see chart for details).  Clinical Course as of Oct 14 1957  Sat Oct 15, 2018  1952 Potassium(!): 5.2 [AH]    Clinical Course User Index [AH] Margarita Mail, PA-C       Patient here with tremulousness, confusion Patient notable hypertensive and confused. DDX includes electrolyte disturbance, hypertensive urgency, serotonin syndrome.  Currently awaiting labs and imaging results.  Notably EKG does have some new changes.  His troponin is slightly elevated but this is in the setting of end-stage renal disease.   8:41 PM BP (!) 228/132   Pulse 78   Temp 98.2 F (36.8 C) (Oral)   Resp 19   Ht 5\' 5"  (1.651 m)   Wt 86.6 kg   SpO2 99%   BMI 31.78 kg/m  CC: tremors, confusion VS: BP (!) 228/132    Pulse 78   Temp 98.2 F (36.8 C) (Oral)   Resp 19   Ht 5\' 5"  (1.651 m)   Wt 86.6 kg   SpO2 99%   BMI 31.78 kg/m   HM:CNOBSJG is gathered by patient, EMS, and EMR. DDX:The differential diagnosis for AMS is extensive and includes, but is not limited to: drug overdose - opioids, alcohol, sedatives, antipsychotics, drug withdrawal, others; Metabolic: hypoxia, hypoglycemia, hyperglycemia, hypercalcemia, hypernatremia, hyponatremia, uremia, hepatic encephalopathy, hypothyroidism, hyperthyroidism, vitamin B12 or thiamine deficiency, carbon monoxide poisoning, Wilson's disease, Lactic acidosis, DKA/HHOS; Infectious: meningitis, encephalitis, bacteremia/sepsis, urinary tract infection, pneumonia, neurosyphilis; Structural: Space-occupying lesion, (brain tumor, subdural hematoma, hydrocephalus,); Vascular: stroke, subarachnoid hemorrhage, coronary ischemia, hypertensive encephalopathy, CNS vasculitis, thrombotic thrombocytopenic purpura, disseminated intravascular coagulation, hyperviscosity; Psychiatric: Schizophrenia, depression; Other: Seizure, hypothermia, heat stroke, ICU psychosis, dementia -"sundowning." Labs: I reviewed the labs which show  elevated creatinine, Elevated troponin, normal electrolytes normal CK level, normal ammonia,  Imaging: I personally reviewed the images (portable chest x-ray which shows mild cardiomegaly and vascular congestion.  I personally reviewed the patient's head CT which shows no acute intracranial bleeds or pathology)  EKG:   EKG Interpretation  Date/Time:  Saturday October 15 2018 18:02:06 EDT Ventricular Rate:  80 PR Interval:    QRS Duration: 154 QT Interval:  445 QTC Calculation: 514 R Axis:   -45 Text Interpretation:  Sinus rhythm Probable left atrial enlargement RBBB and LAFB BBBB new since previous  Confirmed by Wandra Arthurs [phone]) on 10/15/2018 7:54:02 PM       MDM: Patient here with confusion, tremor.  Suspect this is all secondary to hypertensive  emergency/encephalopathy.  Patient will need admission. Patient disposition: Admit Patient condition: Stable. The patient appears reasonably stabilized for admission considering the current resources, flow, and capabilities available in the ED at this time, and I doubt any other [facility] requiring further screening and/or treatment in the ED prior to admission.  Final Clinical Impressions(s) / ED Diagnoses   Final diagnoses:  Hypertensive encephalopathy    ED Discharge Orders    None       Margarita Mail, PA-C 10/15/18 2057    Drenda Freeze, MD 10/15/18 2156

## 2018-10-16 DIAGNOSIS — Z992 Dependence on renal dialysis: Secondary | ICD-10-CM

## 2018-10-16 DIAGNOSIS — I350 Nonrheumatic aortic (valve) stenosis: Secondary | ICD-10-CM

## 2018-10-16 DIAGNOSIS — I10 Essential (primary) hypertension: Secondary | ICD-10-CM

## 2018-10-16 DIAGNOSIS — C61 Malignant neoplasm of prostate: Secondary | ICD-10-CM

## 2018-10-16 DIAGNOSIS — E785 Hyperlipidemia, unspecified: Secondary | ICD-10-CM

## 2018-10-16 DIAGNOSIS — N186 End stage renal disease: Secondary | ICD-10-CM

## 2018-10-16 DIAGNOSIS — I161 Hypertensive emergency: Secondary | ICD-10-CM

## 2018-10-16 DIAGNOSIS — E669 Obesity, unspecified: Secondary | ICD-10-CM

## 2018-10-16 LAB — COMPREHENSIVE METABOLIC PANEL
ALT: 13 U/L (ref 0–44)
AST: 15 U/L (ref 15–41)
Albumin: 3.3 g/dL — ABNORMAL LOW (ref 3.5–5.0)
Alkaline Phosphatase: 57 U/L (ref 38–126)
Anion gap: 18 — ABNORMAL HIGH (ref 5–15)
BUN: 36 mg/dL — ABNORMAL HIGH (ref 8–23)
CO2: 25 mmol/L (ref 22–32)
Calcium: 9.1 mg/dL (ref 8.9–10.3)
Chloride: 95 mmol/L — ABNORMAL LOW (ref 98–111)
Creatinine, Ser: 8.31 mg/dL — ABNORMAL HIGH (ref 0.61–1.24)
GFR calc Af Amer: 6 mL/min — ABNORMAL LOW (ref >60)
GFR calc non Af Amer: 5 mL/min — ABNORMAL LOW (ref >60)
Glucose, Bld: 153 mg/dL — ABNORMAL HIGH (ref 70–99)
Potassium: 4.4 mmol/L (ref 3.5–5.1)
Sodium: 138 mmol/L (ref 135–145)
Total Bilirubin: 0.6 mg/dL (ref 0.3–1.2)
Total Protein: 6.7 g/dL (ref 6.5–8.1)

## 2018-10-16 LAB — TROPONIN I (HIGH SENSITIVITY): Troponin I (High Sensitivity): 56 ng/L — ABNORMAL HIGH (ref <18)

## 2018-10-16 LAB — CBC
HCT: 37.3 % — ABNORMAL LOW (ref 39.0–52.0)
Hemoglobin: 11.6 g/dL — ABNORMAL LOW (ref 13.0–17.0)
MCH: 28.6 pg (ref 26.0–34.0)
MCHC: 31.1 g/dL (ref 30.0–36.0)
MCV: 91.9 fL (ref 80.0–100.0)
Platelets: 269 10*3/uL (ref 150–400)
RBC: 4.06 MIL/uL — ABNORMAL LOW (ref 4.22–5.81)
RDW: 20 % — ABNORMAL HIGH (ref 11.5–15.5)
WBC: 7.4 10*3/uL (ref 4.0–10.5)
nRBC: 0 % (ref 0.0–0.2)

## 2018-10-16 LAB — SARS CORONAVIRUS 2 (TAT 6-24 HRS): SARS Coronavirus 2: NEGATIVE

## 2018-10-16 LAB — MAGNESIUM: Magnesium: 2 mg/dL (ref 1.7–2.4)

## 2018-10-16 LAB — PHOSPHORUS: Phosphorus: 7.3 mg/dL — ABNORMAL HIGH (ref 2.5–4.6)

## 2018-10-16 LAB — GLUCOSE, CAPILLARY: Glucose-Capillary: 104 mg/dL — ABNORMAL HIGH (ref 70–99)

## 2018-10-16 LAB — TSH: TSH: 1.824 u[IU]/mL (ref 0.350–4.500)

## 2018-10-16 MED ORDER — CINACALCET HCL 30 MG PO TABS
30.0000 mg | ORAL_TABLET | ORAL | Status: DC
  Administered 2018-10-17: 30 mg via ORAL
  Filled 2018-10-16: qty 1

## 2018-10-16 MED ORDER — HEPARIN SODIUM (PORCINE) 5000 UNIT/ML IJ SOLN
5000.0000 [IU] | Freq: Three times a day (TID) | INTRAMUSCULAR | Status: DC
  Administered 2018-10-16 – 2018-10-18 (×7): 5000 [IU] via SUBCUTANEOUS
  Filled 2018-10-16 (×7): qty 1

## 2018-10-16 MED ORDER — GABAPENTIN 100 MG PO CAPS
100.0000 mg | ORAL_CAPSULE | Freq: Every day | ORAL | Status: DC
  Administered 2018-10-16 – 2018-10-17 (×2): 100 mg via ORAL
  Filled 2018-10-16 (×2): qty 1

## 2018-10-16 MED ORDER — ESCITALOPRAM OXALATE 10 MG PO TABS
10.0000 mg | ORAL_TABLET | Freq: Every day | ORAL | Status: DC
  Administered 2018-10-16 – 2018-10-18 (×3): 10 mg via ORAL
  Filled 2018-10-16 (×3): qty 1

## 2018-10-16 MED ORDER — TICAGRELOR 90 MG PO TABS
90.0000 mg | ORAL_TABLET | Freq: Two times a day (BID) | ORAL | Status: DC
  Administered 2018-10-16 – 2018-10-18 (×5): 90 mg via ORAL
  Filled 2018-10-16 (×5): qty 1

## 2018-10-16 MED ORDER — ACETAMINOPHEN 500 MG PO TABS
1000.0000 mg | ORAL_TABLET | Freq: Four times a day (QID) | ORAL | Status: DC | PRN
  Administered 2018-10-16 – 2018-10-17 (×2): 1000 mg via ORAL
  Filled 2018-10-16 (×2): qty 2

## 2018-10-16 MED ORDER — FERROUS SULFATE 325 (65 FE) MG PO TABS
325.0000 mg | ORAL_TABLET | Freq: Two times a day (BID) | ORAL | Status: DC
  Administered 2018-10-16 – 2018-10-18 (×3): 325 mg via ORAL
  Filled 2018-10-16 (×3): qty 1

## 2018-10-16 MED ORDER — ALLOPURINOL 100 MG PO TABS
100.0000 mg | ORAL_TABLET | Freq: Every day | ORAL | Status: DC
  Administered 2018-10-16 – 2018-10-18 (×3): 100 mg via ORAL
  Filled 2018-10-16 (×3): qty 1

## 2018-10-16 MED ORDER — NITROGLYCERIN 0.4 MG SL SUBL: 0.4000 mg | SUBLINGUAL_TABLET | SUBLINGUAL | Status: DC | PRN

## 2018-10-16 MED ORDER — SUCROFERRIC OXYHYDROXIDE 500 MG PO CHEW
1000.0000 mg | CHEWABLE_TABLET | Freq: Three times a day (TID) | ORAL | Status: DC
  Filled 2018-10-16: qty 2

## 2018-10-16 MED ORDER — ISOSORBIDE MONONITRATE ER 30 MG PO TB24
30.0000 mg | ORAL_TABLET | Freq: Two times a day (BID) | ORAL | Status: DC
  Administered 2018-10-16 – 2018-10-18 (×5): 30 mg via ORAL
  Filled 2018-10-16 (×5): qty 1

## 2018-10-16 MED ORDER — HYDRALAZINE HCL 50 MG PO TABS
50.0000 mg | ORAL_TABLET | Freq: Two times a day (BID) | ORAL | Status: DC
  Administered 2018-10-16 – 2018-10-18 (×3): 50 mg via ORAL
  Filled 2018-10-16 (×3): qty 1

## 2018-10-16 MED ORDER — METHOCARBAMOL 500 MG PO TABS
500.0000 mg | ORAL_TABLET | Freq: Three times a day (TID) | ORAL | Status: DC | PRN
  Administered 2018-10-16 – 2018-10-17 (×2): 500 mg via ORAL
  Filled 2018-10-16 (×2): qty 1

## 2018-10-16 MED ORDER — LIDOCAINE-PRILOCAINE 2.5-2.5 % EX CREA
1.0000 "application " | TOPICAL_CREAM | CUTANEOUS | Status: DC | PRN
  Filled 2018-10-16: qty 5

## 2018-10-16 MED ORDER — SUCROFERRIC OXYHYDROXIDE 500 MG PO CHEW
500.0000 mg | CHEWABLE_TABLET | Freq: Three times a day (TID) | ORAL | Status: DC
  Administered 2018-10-16 – 2018-10-18 (×4): 500 mg via ORAL
  Filled 2018-10-16 (×7): qty 1

## 2018-10-16 MED ORDER — ASPIRIN 81 MG PO CHEW
81.0000 mg | CHEWABLE_TABLET | Freq: Every day | ORAL | Status: DC
  Administered 2018-10-16 – 2018-10-18 (×3): 81 mg via ORAL
  Filled 2018-10-16 (×3): qty 1

## 2018-10-16 MED ORDER — RENA-VITE PO TABS
1.0000 | ORAL_TABLET | Freq: Two times a day (BID) | ORAL | Status: DC
  Administered 2018-10-16 – 2018-10-18 (×5): 1 via ORAL
  Filled 2018-10-16 (×5): qty 1

## 2018-10-16 MED ORDER — PANTOPRAZOLE SODIUM 40 MG PO TBEC
40.0000 mg | DELAYED_RELEASE_TABLET | Freq: Every day | ORAL | Status: DC
  Administered 2018-10-16 – 2018-10-18 (×3): 40 mg via ORAL
  Filled 2018-10-16 (×3): qty 1

## 2018-10-16 MED ORDER — VITAMIN D 25 MCG (1000 UNIT) PO TABS
1000.0000 [IU] | ORAL_TABLET | Freq: Every day | ORAL | Status: DC
  Administered 2018-10-16 – 2018-10-18 (×3): 1000 [IU] via ORAL
  Filled 2018-10-16 (×3): qty 1

## 2018-10-16 MED ORDER — LABETALOL HCL 5 MG/ML IV SOLN
10.0000 mg | INTRAVENOUS | Status: DC | PRN
  Administered 2018-10-16 – 2018-10-18 (×2): 10 mg via INTRAVENOUS
  Filled 2018-10-16 (×2): qty 4

## 2018-10-16 MED ORDER — ATORVASTATIN CALCIUM 40 MG PO TABS
40.0000 mg | ORAL_TABLET | Freq: Every evening | ORAL | Status: DC
  Administered 2018-10-16 – 2018-10-17 (×2): 40 mg via ORAL
  Filled 2018-10-16 (×2): qty 1

## 2018-10-16 NOTE — Progress Notes (Signed)
   10/16/18 0528  Vitals  Temp 97.9 F (36.6 C)  Temp Source Oral  BP (!) 191/85  MAP (mmHg) 116  BP Location Right Wrist  BP Method Automatic  Patient Position (if appropriate) Lying  Pulse Rate 72  Pulse Rate Source Monitor  ECG Heart Rate 72  Cardiac Rhythm NSR  Resp 11   10 mg labatelol given per Md order will continue to monitor

## 2018-10-16 NOTE — Consult Note (Signed)
[facility] Renal Consultation Note    Indication for Consultation:  Management of ESRD/hemodialysis; anemia, hypertension/volume and secondary hyperparathyroidism  HPI: Sean Best is a 85 y.o. male with ESRD, DM Type 2, HTN, prior CVA, Hx, prostate cancer. Now admitted with HTN urgency/tremors. Presented to ED with lethargy, tremors. Patient noted to be incoherent on arrival.  Found to have BP 228/132. IV labetalol, hydralazine dosed. No acute findings on head CT. CXR showing mild vascular congestion. Labs: K 4.4, BUN 36 Cr 8.31, Glu 153, Troponin 59>56, WBC 7.4, Hgb 11.6  Seen and examined at bedside. BP 157/88. He's alert, oriented x 3. Complains of upper extremity weakness/tremors. Can't hold cup without hands shaking. Denies CP, SOB, N,V,D. Denies starting any new medications.   [place] MWF. Last dialysis Friday, completed full treatment. Did miss dialysis Wednesday d/t diarrhea. Using AVF without issues.   Past Medical History:  Diagnosis Date  . Anemia   . Arthritis    "knees" (04/11/2016)  . Blind left eye    S/P trauma  . Chronic total occlusion of artery of the extremities (HCC)    pt not aware of this  . Claudication ([place])   . ESRD on dialysis [facility])    "MWF; [name]" ((04/10/2016)  . ESRD on peritoneal dialysis [facility])    Started dialysis around April 2015 per son.  Has been doing peritoneal dialysis at home.     . Gout   . Hyperlipidemia   . Hypertension   . Overweight(278.02)   . Presence of permanent cardiac pacemaker    medtronic  . Prostate cancer ([place]) 1990s  . Shingles   . Stroke [facility]) ~1998   denies residual on 04/11/2016  . Type II diabetes mellitus (HCC)    diet controlled  . Umbilical hernia 03/07/2692   Past Surgical History:  Procedure Laterality Date  . AV FISTULA PLACEMENT, RADIOCEPHALIC  85/46/2703   Left arm  . CAPD INSERTION N/A 03/06/2013   Procedure: LAPAROSCOPIC INSERTION CONTINUOUS AMBULATORY PERITONEAL  DIALYSIS  (CAPD) CATHETER;  Surgeon: Edward Jolly, MD;  Location: [place];  Service: General;  Laterality: N/A;  . CATARACT EXTRACTION Right   . CHOLECYSTECTOMY  12/02/2017   LAPROSCOPIC  . CORONARY STENT INTERVENTION N/A 02/11/2018   Procedure: CORONARY STENT INTERVENTION;  Surgeon: Sherren Mocha, MD;  Location: [place] CV LAB;  Service: Cardiovascular;  Laterality: N/A;  . EYE SURGERY Left 1970s   for eye injury  . HERNIA REPAIR    . INSERTION PROSTATE RADIATION SEED    . LAPAROSCOPIC CHOLECYSTECTOMY SINGLE SITE WITH INTRAOPERATIVE CHOLANGIOGRAM N/A 12/02/2017   Procedure: LAPAROSCOPIC CHOLECYSTECTOMY;  Surgeon: Michael Boston, MD;  Location: [place];  Service: General;  Laterality: N/A;  . LEFT HEART CATH AND CORONARY ANGIOGRAPHY N/A 02/11/2018   Procedure: LEFT HEART CATH AND CORONARY ANGIOGRAPHY;  Surgeon: Sherren Mocha, MD;  Location: [place] CV LAB;  Service: Cardiovascular;  Laterality: N/A;  . PACEMAKER IMPLANT N/A 04/20/2017   Procedure: PACEMAKER IMPLANT;  Surgeon: Evans Lance, MD;  Location: [place] CV LAB;  Service: Cardiovascular;  Laterality: N/A;  . UMBILICAL HERNIA REPAIR N/A 03/06/2013   Procedure: HERNIA REPAIR UMBILICAL WITH MESH;  Surgeon: Edward Jolly, MD;  Location: [facility] OR;  Service: General;  Laterality: N/A;   Family History  Problem Relation Age of Onset  . Cancer Mother   . Heart disease Mother   . Cancer Father    Social History:  reports that he has never smoked. He  has never used smokeless tobacco. He reports that he does not drink alcohol or use drugs. Allergies  Allergen Reactions  . Cardura [Doxazosin Mesylate] Other (See Comments)    Hallucinations  . Tape     Paper tape   Prior to Admission medications   Medication Sig Start Date End Date Taking? Authorizing Provider  acetaminophen (TYLENOL) 500 MG tablet Take 1,000 mg by mouth every 6 (six) hours as needed for mild pain.   Yes [provider]  allopurinol  (ZYLOPRIM) 100 MG tablet Take 100 mg by mouth daily.     Yes [provider]  amLODipine (NORVASC) 10 MG tablet Take 10 mg by mouth See admin instructions. Taking only on Tues, Thur, Sat, Sunday 05/15/18  Yes [provider]  aspirin 81 MG chewable tablet Chew 1 tablet (81 mg total) by mouth daily. 02/14/18  Yes Oretha Milch D, MD  atenolol (TENORMIN) 25 MG tablet Take one tablet by mouth daily on NON HEMODIALYSIS days only 07/12/18  Yes Evans Lance, MD  atorvastatin (LIPITOR) 40 MG tablet Take 40 mg by mouth every evening.  07/07/17  Yes [provider]  Cholecalciferol (VITAMIN D-3) 1000 units CAPS Take 1,000 Units by mouth daily.   Yes [provider]  Cinacalcet HCl (SENSIPAR PO) Take by mouth every Monday, Wednesday, and Friday.   Yes [provider]  escitalopram (LEXAPRO) 10 MG tablet Take 10 mg by mouth daily. 04/06/18  Yes [provider]  ferrous sulfate 325 (65 FE) MG EC tablet Take 325 mg by mouth 2 (two) times daily with a meal.    Yes [provider]  gabapentin (NEURONTIN) 100 MG capsule Take 100 mg by mouth at bedtime. 12/27/16  Yes [provider]  isosorbide mononitrate (IMDUR) 30 MG 24 hr tablet Take 1 tablet (30 mg total) by mouth daily. Patient taking differently: Take 30 mg by mouth 2 (two) times daily. Taking on [place], Sat and Sunday 02/14/18  Yes Oretha Milch D, MD  lidocaine-prilocaine (EMLA) cream Apply 1 application topically as needed (for port access).  10/28/17  Yes [provider]  methocarbamol (ROBAXIN) 500 MG tablet Take 500 mg by mouth 3 (three) times daily as needed. 02/22/18  Yes [provider]  multivitamin (RENA-VIT) TABS tablet Take 1 tablet by mouth 2 (two) times daily.    Yes [provider]  nitroGLYCERIN (NITROSTAT) 0.4 MG SL tablet PLEASE SEE ATTACHED FOR DETAILED DIRECTIONS Patient taking differently: Place 0.4 mg under the tongue every 5 (five)  minutes as needed for chest pain.  06/17/18  Yes Leonie Man, MD  pantoprazole (PROTONIX) 40 MG tablet Take 1 tablet (40 mg total) by mouth 2 (two) times daily. Patient taking differently: Take 40 mg by mouth daily.  02/13/18 10/16/18 Yes Oretha Milch D, MD  ticagrelor (BRILINTA) 90 MG TABS tablet Take 1 tablet (90 mg total) by mouth 2 (two) times daily. 02/13/18  Yes Desiree Hane, MD  VELPHORO 500 MG chewable tablet Chew 500 mg by mouth 3 (three) times daily with meals.  01/28/18  Yes [provider]  calcium acetate (PHOSLO) 667 MG capsule Take 3 capsules (2,001 mg total) by mouth 3 (three) times daily with meals. Patient not taking: Reported on 10/16/2018 04/30/18   Nita Sells, MD   Current Facility-Administered Medications  Medication Dose Route Frequency Provider Last Rate Last Dose  . amLODipine (NORVASC) tablet 10 mg  10 mg Oral Daily Margarita Mail, PA-C   10  mg at 10/16/18 1036  . atenolol (TENORMIN) tablet 25 mg  25 mg Oral Daily Harris, Abigail, PA-C   25 mg at 10/16/18 1036  . heparin injection 5,000 Units  5,000 Units Subcutaneous Q8H Dana Allan I, MD      . labetalol (NORMODYNE) injection 10 mg  10 mg Intravenous Q2H PRN Dana Allan I, MD   10 mg at 10/16/18 0519     ROS: As per HPI otherwise negative.  Physical Exam: Vitals:   10/16/18 0600 10/16/18 0700 10/16/18 0754 10/16/18 1031  BP: (!) 175/77 (!) 178/83 (!) 165/83 (!) 157/88  Pulse:   71   Resp: (!) 24 13 16 15   Temp:   98.5 F (36.9 C) 98.9 F (37.2 C)  TempSrc:   Oral Oral  SpO2:   97% 98%  Weight:      Height:         General: WDWN NAD Head: NCAT sclera not icteric MMM Neck: Supple. No JVD No masses Lungs: CTA bilaterally without wheezes, rales, or rhonchi. Breathing is unlabored. Heart: RRR with S1 S2 Abdomen: soft NT + BS Lower extremities:without edema or ischemic changes, no open wounds  Neuro: A & O  X 3. Moves all extremities spontaneously. Hand tremoulous  when trying to hold object  Psych:  Anxious, trouble finding words  Dialysis Access: LUE AVF +bruit   Labs: Basic Metabolic Panel: Recent Labs  Lab 10/15/18 1932 10/15/18 1941 10/16/18 0138  NA 140 138 138  K 4.9 5.2* 4.4  CL 98 100 95*  CO2 27  --  25  GLUCOSE 113* 104* 153*  BUN 33* 45* 36*  CREATININE 8.09* 8.30* 8.31*  CALCIUM 9.4  --  9.1  PHOS  --   --  7.3*   Liver Function Tests: Recent Labs  Lab 10/15/18 1932 10/16/18 0138  AST 14* 15  ALT 13 13  ALKPHOS 68 57  BILITOT 0.6 0.6  PROT 7.1 6.7  ALBUMIN 3.5 3.3*   No results for input(s): LIPASE, AMYLASE in the last 168 hours. Recent Labs  Lab 10/15/18 1932  AMMONIA 24   CBC: Recent Labs  Lab 10/15/18 1941 10/16/18 0138  WBC  --  7.4  HGB 13.3 11.6*  HCT 39.0 37.3*  MCV  --  91.9  PLT  --  269   Cardiac Enzymes: Recent Labs  Lab 10/15/18 1932  CKTOTAL 103   CBG: Recent Labs  Lab 10/15/18 1854 10/16/18 0032  GLUCAP 120* 104*   Iron Studies: No results for input(s): IRON, TIBC, TRANSFERRIN, FERRITIN in the last 72 hours. Studies/Results: Ct Head Wo Contrast  Result Date: 10/15/2018 CLINICAL DATA:  85 year old male with altered mental status. EXAM: CT HEAD WITHOUT CONTRAST TECHNIQUE: Contiguous axial images were obtained from the base of the skull through the vertex without intravenous contrast. COMPARISON:  Head CT dated 12/09/2013 FINDINGS: Brain: There is mild age-related atrophy and chronic microvascular ischemic changes. There is no acute intracranial hemorrhage. No mass effect or midline shift. No extra-axial fluid collection Vascular: No hyperdense vessel or unexpected calcification. Skull: Normal. Negative for fracture or focal lesion. Sinuses/Orbits: No acute finding. Left scleral band noted. Other: A 2.1 x 1.3 cm heterogeneous lesion in the subcutaneous soft tissues of the right occipital appears similar or slightly larger compared to the prior CT. This is not characterized but may  represent a complex sebaceous cyst or a fat containing lesion. The relative stability over the course of 5 years suggests a benign or indolent  process. IMPRESSION: 1. No acute intracranial hemorrhage. 2. Mild age-related atrophy and chronic microvascular ischemic changes. Electronically Signed   By: Anner Crete M.D.   On: 10/15/2018 20:52   Dg Chest Port 1 View  Result Date: 10/15/2018 CLINICAL DATA:  Shortness of breath. EXAM: PORTABLE CHEST 1 VIEW COMPARISON:  Radiograph 04/27/2018, report from chest CT 02/10/2018, images not available FINDINGS: Right-sided pacemaker in place. Cardiomegaly is unchanged. Unchanged mediastinal contours with aortic atherosclerosis and tortuosity. Mild vascular congestion without overt edema. Possible small left pleural effusion. No confluent airspace disease. No pneumothorax. No acute osseous abnormalities are seen. IMPRESSION: Cardiomegaly with vascular congestion. Possible small left pleural effusion. Electronically Signed   By: Keith Rake M.D.   On: 10/15/2018 20:02    Dialysis Orders:  [facility] MWF 4h BFR 450 EDW 87kg 2K/2Ca UF 2  AVF No heparin -Sensipar 150  PO TIW -Calcitriol 0.75 mcg PO TIW -Mircera 60 mcg IV q 2 weeks (last 8/5)   Assessment/Plan: 1. AMS/new tremors - No acute findings on head CT. MS appears improving today, though still with UE tremors.  Unclear etiology so far.  DDx : HTN,  medication toxicity, uremia.  Only getting BP meds now.  2. ESRD -  HD MWF. No urgent HD indications today. HD tomorrow on schedule.  3. Hypertension/volume  -Hypertensive urgency on admission. IV meds + home amlodipine, atenolol resumed. Will try to challenge EDW with UF tomorrow.  4.  Anemia  - Hgb 11.6 No ESA needs.  5.  Metabolic bone disease -  Resume Velphoro for elevated phos. Continue calcitriol.  6.  Nutrition - Renal diet supplements   [name] PA-C [place] Pager [phone] 10/16/2018, 12:05 PM

## 2018-10-16 NOTE — Progress Notes (Signed)
PROGRESS NOTE    Sean Best  DUK:[id number] DOB: Jan 25, 1934 DOA: 10/15/2018 PCP: Seward Carol, MD    Brief Narrative:  85 year old male who presented with confusion and uncontrolled blood pressure.  He does have significant past medical history for end stage renal disease on hemodialysis, type II that is mellitus, history of CVA, prostate cancer, hypertension and dyslipidemia.  Patient was noted to have tremors, shakes and altered mentation, she was brought into the hospital for further evaluation.  On his initial physical examination his blood pressure was 228/132, his heart rate 74, respiratory rate 20, oxygen saturation 99%, he was awake and alert, his lungs were clear to auscultation bilaterally, heart S1-S2 present and rhythmic, abdomen protuberant, trace ankle edema, AV fistula on his left upper extremity. Sodium 140, potassium 4.9, chloride 98, bicarb 27, glucose 113, BUN 33, creatinine 8.0, hemoglobin 13.3, hematocrit 39.0.  SARS COVID-19 was negative.  Chest radiograph had cardiomegaly, bilateral hilar vascular congestion.  Head CT no acute changes.  EKG 80 bpm, left axis deviation, right bundle branch block, sinus rhythm, no significant ST changes, V1 through V3 T wave inversion.  Patient was admitted to the hospital with the working diagnosis of uncontrolled hypertension complicated by hypertensive encephalopathy/ hypertensive  emergency.    Assessment & Plan:   Principal Problem:   Hypertensive emergency Active Problems:   ESRD on dialysis ([place])   DM (diabetes mellitus), type 2 with renal complications, diet controlled   HTN (hypertension)   Hyperlipidemia   Prostate CA (HCC)   Obesity (BMI 30-39.9)   Aortic stenosis   Hypertensive encephalopathy   1. Hypertensive emergency/ hypertensive encephalopathy. Patient is feeling better but not yet back to baseline, will continue blood pressure control with amlodipine and will resume atenolol, isosorbide and add hydralazine. Plan  for HD with ultrafiltration in am. Continue telemetry monitoring.  2. ESRD on HD. Patient will continue HD per his schedule M-W-F. He reports missing on session of HD last week.   3. T2DM. Continue glucose cover and monitoring with insulin sliding scale, patient is tolerating po well.   4. Dyslipidemia. Continue statin therapy.  5. Iron deficiency anemia. Continue with iron supplementation.   6. Hx of CVA with chronic left sided weakness. Continue ticagrelor, and blood pressure monitoring.   7. Obesity. BMI is 31. Will need follow as out patient.   DVT prophylaxis: heparin   Code Status: full Family Communication: no family at the bedside Disposition Plan/ discharge barriers: pending clinical improvement, possible dc in am if improves blood pressure.   Body mass index is 31.78 kg/m. Malnutrition Type:      Malnutrition Characteristics:      Nutrition Interventions:     RN Pressure Injury Documentation:     Consultants:   Nephrology   Procedures:     Antimicrobials:       Subjective: Patient is feeling better but not back to baseline, no nausea or vomiting, no chest pain or dyspnea. At home his wife is in charge of his medications and he reports missing on session of HD last week due to diarrhea.   Objective: Vitals:   10/16/18 0754 10/16/18 1031 10/16/18 1200 10/16/18 1216  BP: (!) 165/83 (!) 157/88 (!) 180/79 (!) 177/74  Pulse: 71  69   Resp: 16 15 15    Temp: 98.5 F (36.9 C) 98.9 F (37.2 C) 98.6 F (37 C)   TempSrc: Oral Oral Oral   SpO2: 97% 98% 97%   Weight:      Height:  Intake/Output Summary (Last 24 hours) at 10/16/2018 1353 Last data filed at 10/16/2018 0900 Gross per 24 hour  Intake 600 ml  Output -  Net 600 ml   Filed Weights   10/15/18 1805  Weight: 86.6 kg    Examination:   General: Not in pain or dyspnea, deconditioned  Neurology: Awake and alert, non focal, mild confusion   E ENT: mild pallor, no icterus, oral  mucosa moist Cardiovascular: No JVD. S1-S2 present, rhythmic, no gallops, rubs, or murmurs.Trace lower extremity edema. Pulmonary: positive breath sounds bilaterally, adequate air movement, no wheezing, rhonchi or rales. Gastrointestinal. Abdomen with no organomegaly, non tender, no rebound or guarding Skin. No rashes Musculoskeletal: no joint deformities Left upper extremity av fistula.     Data Reviewed: I have personally reviewed following labs and imaging studies  CBC: Recent Labs  Lab 10/15/18 1941 10/16/18 0138  WBC  --  7.4  HGB 13.3 11.6*  HCT 39.0 37.3*  MCV  --  91.9  PLT  --  734   Basic Metabolic Panel: Recent Labs  Lab 10/15/18 1932 10/15/18 1941 10/16/18 0138  NA 140 138 138  K 4.9 5.2* 4.4  CL 98 100 95*  CO2 27  --  25  GLUCOSE 113* 104* 153*  BUN 33* 45* 36*  CREATININE 8.09* 8.30* 8.31*  CALCIUM 9.4  --  9.1  MG  --   --  2.0  PHOS  --   --  7.3*   GFR: Estimated Creatinine Clearance: 6.8 mL/min (A) (by C-G formula based on SCr of 8.31 mg/dL (H)). Liver Function Tests: Recent Labs  Lab 10/15/18 1932 10/16/18 0138  AST 14* 15  ALT 13 13  ALKPHOS 68 57  BILITOT 0.6 0.6  PROT 7.1 6.7  ALBUMIN 3.5 3.3*   No results for input(s): LIPASE, AMYLASE in the last 168 hours. Recent Labs  Lab 10/15/18 1932  AMMONIA 24   Coagulation Profile: No results for input(s): INR, PROTIME in the last 168 hours. Cardiac Enzymes: Recent Labs  Lab 10/15/18 1932  CKTOTAL 103   BNP (last 3 results) No results for input(s): PROBNP in the last 8760 hours. HbA1C: No results for input(s): HGBA1C in the last 72 hours. CBG: Recent Labs  Lab 10/15/18 1854 10/16/18 0032  GLUCAP 120* 104*   Lipid Profile: No results for input(s): CHOL, HDL, LDLCALC, TRIG, CHOLHDL, LDLDIRECT in the last 72 hours. Thyroid Function Tests: Recent Labs    10/16/18 0138  TSH 1.824   Anemia Panel: No results for input(s): VITAMINB12, FOLATE, FERRITIN, TIBC, IRON,  RETICCTPCT in the last 72 hours.    Radiology Studies: I have reviewed all of the imaging during this hospital visit personally     Scheduled Meds: . amLODipine  10 mg Oral Daily  . atenolol  25 mg Oral Daily  . heparin  5,000 Units Subcutaneous Q8H  . sucroferric oxyhydroxide  1,000 mg Oral TID WC   Continuous Infusions:   LOS: 1 day        Keaisha Sublette Gerome Apley, MD

## 2018-10-16 NOTE — Plan of Care (Signed)
Nursing will continue to monitor.  

## 2018-10-16 NOTE — Progress Notes (Signed)
RN spoke with patient's daughter [name]. RN asked for a baseline for the patient and asked about the tremors in patient's arms. Daughter states patient became confused on yesterday and gradually became weaker. Daughter states this has happened in a previous hospitalization.  Daughter also states that patient's blood pressure runs generally high around 160s and anything lower than that, the patient becomes symptomatic.  Patient requiring assistance with meals and 2 person assist for transfers. Nursing will continue to monitor.

## 2018-10-17 ENCOUNTER — Inpatient Hospital Stay (HOSPITAL_COMMUNITY): Payer: Medicare Other

## 2018-10-17 DIAGNOSIS — E1122 Type 2 diabetes mellitus with diabetic chronic kidney disease: Secondary | ICD-10-CM

## 2018-10-17 LAB — BASIC METABOLIC PANEL
Anion gap: 17 — ABNORMAL HIGH (ref 5–15)
BUN: 55 mg/dL — ABNORMAL HIGH (ref 8–23)
CO2: 24 mmol/L (ref 22–32)
Calcium: 9 mg/dL (ref 8.9–10.3)
Chloride: 96 mmol/L — ABNORMAL LOW (ref 98–111)
Creatinine, Ser: 10.37 mg/dL — ABNORMAL HIGH (ref 0.61–1.24)
GFR calc Af Amer: 5 mL/min — ABNORMAL LOW (ref >60)
GFR calc non Af Amer: 4 mL/min — ABNORMAL LOW (ref >60)
Glucose, Bld: 104 mg/dL — ABNORMAL HIGH (ref 70–99)
Potassium: 5.5 mmol/L — ABNORMAL HIGH (ref 3.5–5.1)
Sodium: 137 mmol/L (ref 135–145)

## 2018-10-17 LAB — CBC
HCT: 34 % — ABNORMAL LOW (ref 39.0–52.0)
Hemoglobin: 10.7 g/dL — ABNORMAL LOW (ref 13.0–17.0)
MCH: 29 pg (ref 26.0–34.0)
MCHC: 31.5 g/dL (ref 30.0–36.0)
MCV: 92.1 fL (ref 80.0–100.0)
Platelets: 272 10*3/uL (ref 150–400)
RBC: 3.69 MIL/uL — ABNORMAL LOW (ref 4.22–5.81)
RDW: 20.1 % — ABNORMAL HIGH (ref 11.5–15.5)
WBC: 8.8 10*3/uL (ref 4.0–10.5)
nRBC: 0 % (ref 0.0–0.2)

## 2018-10-17 LAB — GLUCOSE, CAPILLARY: Glucose-Capillary: 120 mg/dL — ABNORMAL HIGH (ref 70–99)

## 2018-10-17 NOTE — Progress Notes (Signed)
[facility] Progress Note  Subjective: pt on dialysis ,lethargic but arouses and states his name clearly.   Vitals:   10/17/18 1000 10/17/18 1030 10/17/18 1100 10/17/18 1104  BP: (!) 147/74 (!) 150/68 (!) 145/70 (!) 159/78  Pulse: 80 85 84 83  Resp: 16 17 18 18   Temp:    98 F (36.7 C)  TempSrc:    Oral  SpO2:    99%  Weight:    87.3 kg  Height:        Inpatient medications: . allopurinol  100 mg Oral Daily  . amLODipine  10 mg Oral Daily  . aspirin  81 mg Oral Daily  . atenolol  25 mg Oral Daily  . atorvastatin  40 mg Oral QPM  . cholecalciferol  1,000 Units Oral Daily  . cinacalcet  30 mg Oral Q M,W,F-HD  . escitalopram  10 mg Oral Daily  . ferrous sulfate  325 mg Oral BID WC  . gabapentin  100 mg Oral QHS  . heparin  5,000 Units Subcutaneous Q8H  . hydrALAZINE  50 mg Oral BID  . isosorbide mononitrate  30 mg Oral BID  . multivitamin  1 tablet Oral BID  . pantoprazole  40 mg Oral Daily  . sucroferric oxyhydroxide  500 mg Oral TID WC  . ticagrelor  90 mg Oral BID    acetaminophen, labetalol, lidocaine-prilocaine, methocarbamol, nitroGLYCERIN    Exam:  lethargic but easily arouses and states his name  no jvd  Chest cta bilat  Cor reg no mrg  Abd soft ntnd   Ext no edema  LUE AVF+bruit   Ox 2, L side weak    Dialysis: [facility] MWF   4h  87kg   2/2  P2  AVF  No heparin  -Sensipar 150  PO TIW  -Calcitriol 0.75 mcg PO TIW  -Mircera 60 mcg IV q 2 weeks (last 8/5)    Assessment/ Plan: 1. AMS/ tremors - head CT negative. MS improved in house, ddx uncont HTN, med toxicityk, uremia.  2. ESRD - on HD MWF. Pt missed one HD last wk for diarrhea.  3. HTN/ vol - BP's high on admit, got IV prn meds and home meds resumed (atenolol/ norvasc), hydralazine 25 tid added here. Unable to challenge volume today due to dec'd MS w/ slight BP drops into 100- 110 range. No vol excess on exam. Will have to manage w/ BP meds.  4. Anemia ckd - Hb > 10, no esa needs 5. MBD  ckd - cont binders, vit D 6. DM 2 7. H/o CVA w/ chronic L hemiplegia    [facility] 10/17/2018, 12:04 PM  Iron/TIBC/Ferritin/ %Sat    Component Value Date/Time   IRON 20 (L) 03/15/2013 1232   TIBC 164 (L) 03/15/2013 1232   IRONPCTSAT 12 (L) 03/15/2013 1232   Recent Labs  Lab 10/16/18 0138 10/17/18 0418  NA 138 137  K 4.4 5.5*  CL 95* 96*  CO2 25 24  GLUCOSE 153* 104*  BUN 36* 55*  CREATININE 8.31* 10.37*  CALCIUM 9.1 9.0  PHOS 7.3*  --   ALBUMIN 3.3*  --    Recent Labs  Lab 10/16/18 0138  AST 15  ALT 13  ALKPHOS 57  BILITOT 0.6  PROT 6.7   Recent Labs  Lab 10/17/18 0625  WBC 8.8  HGB 10.7*  HCT 34.0*  PLT 272

## 2018-10-17 NOTE — Progress Notes (Signed)
PROGRESS NOTE    Sean Best  VFI:[id number] DOB: 03/19/1933 DOA: 10/15/2018 PCP: Seward Carol, MD    Brief Narrative:  85 year old male who presented with confusion and uncontrolled blood pressure.  He does have significant past medical history for end stage renal disease on hemodialysis, type II that is mellitus, history of CVA, prostate cancer, hypertension and dyslipidemia.  Patient was noted to have tremors, shakes and altered mentation, she was brought into the hospital for further evaluation.  On his initial physical examination his blood pressure was 228/132, his heart rate 74, respiratory rate 20, oxygen saturation 99%, he was awake and alert, his lungs were clear to auscultation bilaterally, heart S1-S2 present and rhythmic, abdomen protuberant, trace ankle edema, AV fistula on his left upper extremity. Sodium 140, potassium 4.9, chloride 98, bicarb 27, glucose 113, BUN 33, creatinine 8.0, hemoglobin 13.3, hematocrit 39.0.  SARS COVID-19 was negative.  Chest radiograph had cardiomegaly, bilateral hilar vascular congestion.  Head CT no acute changes.  EKG 80 bpm, left axis deviation, right bundle branch block, sinus rhythm, no significant ST changes, V1 through V3 T wave inversion.  Patient was admitted to the hospital with the working diagnosis of uncontrolled hypertension complicated by hypertensive encephalopathy/ hypertensive  emergency   Assessment & Plan:   Principal Problem:   Hypertensive emergency Active Problems:   ESRD on dialysis ([place])   DM (diabetes mellitus), type 2 with renal complications, diet controlled   HTN (hypertension)   Hyperlipidemia   Prostate CA ([place])   Obesity (BMI 30-39.9)   Aortic stenosis   Hypertensive encephalopathy   1. Hypertensive emergency/ hypertensive encephalopathy. Blood pressure systolic between 416 and 606 mmHg, will continue medical therapy with amlodipine, atenolol, isosorbide and hydralazine. Continue close monitoring. Continue  to be confused, not clear baseline, will have physical therapy evaluation.   2. ESRD on HD. Patient underwent HD today, per his regular schedule. Continue with cincalcet for metabolic bone disease.   3. T2DM. On insulin sliding scale, patient is tolerating po well.   4. Dyslipidemia. On statin therapy.  5. Iron deficiency anemia. On iron supplementation.   6. Hx of CVA with chronic left sided weakness. Antiplatelet therapy with  ticagrelor.   7. Obesity. BMI is 31. Will need follow as out patient.   DVT prophylaxis: heparin   Code Status: full Family Communication: no family at the bedside Disposition Plan/ discharge barriers: possible dc in am.     Body mass index is 32.03 kg/m. Malnutrition Type:      Malnutrition Characteristics:      Nutrition Interventions:     RN Pressure Injury Documentation:     Consultants:   Nephrology  Procedures:     Antimicrobials:       Subjective: Patient is feeling better, but continue to be confused and disorientated, no chest pain or dyspnea, had HD today. No nausea or vomiting.   Objective: Vitals:   10/17/18 1030 10/17/18 1100 10/17/18 1104 10/17/18 1250  BP: (!) 150/68 (!) 145/70 (!) 159/78 (!) 164/85  Pulse: 85 84 83 89  Resp: 17 18 18 19   Temp:   98 F (36.7 C) 98.2 F (36.8 C)  TempSrc:   Oral Oral  SpO2:   99% 97%  Weight:   87.3 kg   Height:        Intake/Output Summary (Last 24 hours) at 10/17/2018 1433 Last data filed at 10/17/2018 1104 Gross per 24 hour  Intake 1000 ml  Output 1 ml  Net 999 ml  Filed Weights   10/17/18 0554 10/17/18 0650 10/17/18 1104  Weight: 89.6 kg 87.3 kg 87.3 kg    Examination:   General: Not in pain or dyspnea, deconditoned Neurology: Awake and alert, non focal , confused and disorientated.  E ENT: no pallor, no icterus, oral mucosa moist Cardiovascular: No JVD. S1-S2 present, rhythmic, no gallops, rubs, or murmurs. Trace lower extremity edema.  Pulmonary: postive breath sounds bilaterally, adequate air movement, no wheezing, rhonchi or rales. Gastrointestinal. Abdomen with no organomegaly, non tender, no rebound or guarding Skin. No rashes Musculoskeletal: no joint deformities     Data Reviewed: I have personally reviewed following labs and imaging studies  CBC: Recent Labs  Lab 10/15/18 1941 10/16/18 0138 10/17/18 0625  WBC  --  7.4 8.8  HGB 13.3 11.6* 10.7*  HCT 39.0 37.3* 34.0*  MCV  --  91.9 92.1  PLT  --  269 481   Basic Metabolic Panel: Recent Labs  Lab 10/15/18 1932 10/15/18 1941 10/16/18 0138 10/17/18 0418  NA 140 138 138 137  K 4.9 5.2* 4.4 5.5*  CL 98 100 95* 96*  CO2 27  --  25 24  GLUCOSE 113* 104* 153* 104*  BUN 33* 45* 36* 55*  CREATININE 8.09* 8.30* 8.31* 10.37*  CALCIUM 9.4  --  9.1 9.0  MG  --   --  2.0  --   PHOS  --   --  7.3*  --    GFR: Estimated Creatinine Clearance: 5.5 mL/min (A) (by C-G formula based on SCr of 10.37 mg/dL (H)). Liver Function Tests: Recent Labs  Lab 10/15/18 1932 10/16/18 0138  AST 14* 15  ALT 13 13  ALKPHOS 68 57  BILITOT 0.6 0.6  PROT 7.1 6.7  ALBUMIN 3.5 3.3*   No results for input(s): LIPASE, AMYLASE in the last 168 hours. Recent Labs  Lab 10/15/18 1932  AMMONIA 24   Coagulation Profile: No results for input(s): INR, PROTIME in the last 168 hours. Cardiac Enzymes: Recent Labs  Lab 10/15/18 1932  CKTOTAL 103   BNP (last 3 results) No results for input(s): PROBNP in the last 8760 hours. HbA1C: No results for input(s): HGBA1C in the last 72 hours. CBG: Recent Labs  Lab 10/15/18 1854 10/16/18 0032 10/17/18 0833  GLUCAP 120* 104* 120*   Lipid Profile: No results for input(s): CHOL, HDL, LDLCALC, TRIG, CHOLHDL, LDLDIRECT in the last 72 hours. Thyroid Function Tests: Recent Labs    10/16/18 0138  TSH 1.824   Anemia Panel: No results for input(s): VITAMINB12, FOLATE, FERRITIN, TIBC, IRON, RETICCTPCT in the last 72 hours.     Radiology Studies: I have reviewed all of the imaging during this hospital visit personally     Scheduled Meds: . allopurinol  100 mg Oral Daily  . amLODipine  10 mg Oral Daily  . aspirin  81 mg Oral Daily  . atenolol  25 mg Oral Daily  . atorvastatin  40 mg Oral QPM  . cholecalciferol  1,000 Units Oral Daily  . cinacalcet  30 mg Oral Q M,W,F-HD  . escitalopram  10 mg Oral Daily  . ferrous sulfate  325 mg Oral BID WC  . gabapentin  100 mg Oral QHS  . heparin  5,000 Units Subcutaneous Q8H  . hydrALAZINE  50 mg Oral BID  . isosorbide mononitrate  30 mg Oral BID  . multivitamin  1 tablet Oral BID  . pantoprazole  40 mg Oral Daily  . sucroferric oxyhydroxide  500 mg Oral  TID WC  . ticagrelor  90 mg Oral BID   Continuous Infusions:   LOS: 2 days        Mauricio Gerome Apley, MD

## 2018-10-18 LAB — BASIC METABOLIC PANEL
Anion gap: 15 (ref 5–15)
BUN: 28 mg/dL — ABNORMAL HIGH (ref 8–23)
CO2: 25 mmol/L (ref 22–32)
Calcium: 9.1 mg/dL (ref 8.9–10.3)
Chloride: 96 mmol/L — ABNORMAL LOW (ref 98–111)
Creatinine, Ser: 6.61 mg/dL — ABNORMAL HIGH (ref 0.61–1.24)
GFR calc Af Amer: 8 mL/min — ABNORMAL LOW (ref >60)
GFR calc non Af Amer: 7 mL/min — ABNORMAL LOW (ref >60)
Glucose, Bld: 118 mg/dL — ABNORMAL HIGH (ref 70–99)
Potassium: 4.8 mmol/L (ref 3.5–5.1)
Sodium: 136 mmol/L (ref 135–145)

## 2018-10-18 MED ORDER — LOPERAMIDE HCL 2 MG PO CAPS: 2.0000 mg | ORAL_CAPSULE | Freq: Four times a day (QID) | ORAL | 0 refills | Status: DC | PRN

## 2018-10-18 MED ORDER — LOPERAMIDE HCL 2 MG PO CAPS
2.0000 mg | ORAL_CAPSULE | ORAL | Status: DC | PRN
  Administered 2018-10-18: 2 mg via ORAL
  Filled 2018-10-18: qty 1

## 2018-10-18 MED ORDER — HYDRALAZINE HCL 50 MG PO TABS: 50.0000 mg | ORAL_TABLET | Freq: Two times a day (BID) | ORAL | 0 refills | Status: DC

## 2018-10-18 NOTE — Progress Notes (Signed)
[facility] Progress Note  Subjective: pt seen in room, is much more alert and interactive today  Vitals:   10/17/18 2100 10/18/18 0300 10/18/18 0359 10/18/18 0538  BP: (!) 147/92 (!) 193/94 (!) 200/91 (!) 174/88  Pulse: 83 89 77 70  Resp: 19 13 (!) 24 (!) 21  Temp: 98.7 F (37.1 C) 98 F (36.7 C)    TempSrc: Oral Oral    SpO2: 97% 98%    Weight:  87.9 kg    Height:        Inpatient medications: . allopurinol  100 mg Oral Daily  . amLODipine  10 mg Oral Daily  . aspirin  81 mg Oral Daily  . atenolol  25 mg Oral Daily  . atorvastatin  40 mg Oral QPM  . cholecalciferol  1,000 Units Oral Daily  . cinacalcet  30 mg Oral Q M,W,F-HD  . escitalopram  10 mg Oral Daily  . ferrous sulfate  325 mg Oral BID WC  . gabapentin  100 mg Oral QHS  . heparin  5,000 Units Subcutaneous Q8H  . hydrALAZINE  50 mg Oral BID  . isosorbide mononitrate  30 mg Oral BID  . multivitamin  1 tablet Oral BID  . pantoprazole  40 mg Oral Daily  . sucroferric oxyhydroxide  500 mg Oral TID WC  . ticagrelor  90 mg Oral BID    acetaminophen, labetalol, lidocaine-prilocaine, loperamide, methocarbamol, nitroGLYCERIN    Exam:  lethargic but easily arouses and states his name  no jvd  Chest cta bilat  Cor reg no mrg  Abd soft ntnd   Ext no edema  LUE AVF+bruit   Ox 2, L side weak    Dialysis: GO MWF   4h  87kg   2/2  P2  AVF  No heparin  -Sensipar 150  PO TIW  -Calcitriol 0.75 mcg PO TIW  -Mircera 60 mcg IV q 2 weeks (last 8/5)    Assessment/ Plan: 1. AMS/ tremors - head CT negative. MS improved here back to baseline and Ox 3. DDx uncont HTN, med toxicity, uremia.  2. ESRD - on HD MWF. Pt missed one HD last wk for diarrhea.  3. HTN/ vol - BP's high on admit, got IV prn meds and home meds resumed (atenolol/ norvasc), hydralazine 25 tid added here. Unable to challenge volume due to drop in mentation w/ mild BP drops. At dry wt. 4. Anemia ckd - Hb > 10, no esa needs 5. MBD ckd -  cont binders, vit D 6. DM 2 7. H/o CVA w/ chronic L hemiplegia 8. Dispo - going home today    [facility] 10/18/2018, 12:24 PM  Iron/TIBC/Ferritin/ %Sat    Component Value Date/Time   IRON 20 (L) 03/15/2013 1232   TIBC 164 (L) 03/15/2013 1232   IRONPCTSAT 12 (L) 03/15/2013 1232   Recent Labs  Lab 10/16/18 0138  10/18/18 0254  NA 138   < > 136  K 4.4   < > 4.8  CL 95*   < > 96*  CO2 25   < > 25  GLUCOSE 153*   < > 118*  BUN 36*   < > 28*  CREATININE 8.31*   < > 6.61*  CALCIUM 9.1   < > 9.1  PHOS 7.3*  --   --   ALBUMIN 3.3*  --   --    < > = values in this interval not displayed.   Recent Labs  Lab 10/16/18 0138  AST 15  ALT 13  ALKPHOS 57  BILITOT 0.6  PROT 6.7   Recent Labs  Lab 10/17/18 0625  WBC 8.8  HGB 10.7*  HCT 34.0*  PLT 272

## 2018-10-18 NOTE — Discharge Summary (Signed)
Physician Discharge Summary  Sean Best KCL:[id number] DOB: 01-27-1934 DOA: 10/15/2018  PCP: Seward Carol, MD  Admit date: 10/15/2018 Discharge date: 10/18/2018  Admitted From: Home  Disposition:   Home   Recommendations for Outpatient Follow-up and new medication changes:  1. Follow up with Dr. Delfina Redwood in 7 days.  2. Added hydralazine for blood pressure control.  3. As needed loperamide for diarrhea.   Home Health: no   Equipment/Devices: no    Discharge Condition: stable  CODE STATUS: full  Diet recommendation: heart healthy and renal prudent diet.   Brief/Interim Summary: 85 year old male who presented with confusion and uncontrolled blood pressure. He does have significant past medical history for end stage renal disease on hemodialysis, type II diabetes mellitus, history of CVA, prostate cancer, hypertension and dyslipidemia. Patient was noted to have tremors, shakes and altered mentation, he was brought into the hospital for further evaluation. On his initial physical examination his blood pressure was 228/132, his heart rate 74, respiratory rate 20, oxygen saturation 99%, he was awake and alert, his lungs were clear to auscultation bilaterally, heart S1-S2 present and rhythmic, abdomen protuberant, trace ankle edema, AV fistula on his left upper extremity. Sodium 140, potassium 4.9, chloride 98, bicarb 27, glucose 113, BUN 33, creatinine 8.0, hemoglobin 13.3, hematocrit 39.0. SARS COVID-19 was negative. Chest radiograph had cardiomegaly, bilateral hilar vascular congestion.Head CT no acute changes. EKG 80 bpm, left axis deviation, right bundle branch block, sinus rhythm, no significant ST changes, V1 through V3 T wave inversion.  Patient was admitted to the hospitalwith theworking diagnosis of uncontrolled hypertension complicated by hypertensive encephalopathy/hypertensiveemergency.   1. Hypertensive encephalopathy/hypertensive emergency.  Patient was admitted to the  progressive care unit, he received IV antihypertensive bolus with labetalol, and he was resumed on atenolol, amlodipine and isosorbide.  Hydralazine has been added 50 mg twice daily with good toleration.  His mentation improved back to baseline, and his discharge blood pressure is 174/88.   2.  End-stage renal disease on hemodialysis.  Patient underwent hemodialysis with no major complications, continue cinacalcet for metabolic bone disease.  He is schedule is Monday Wednesday Friday.  3.  Type 2 diabetes mellitus.  Patient was placed on insulin sliding scale for glucose coverage and monitoring.  4.  Dyslipidemia.  Patient will continue statin therapy.  5.  Iron deficiency anemia.  Continue iron supplementation.  6.  History of CVA with chronic left-sided weakness.  Patient ruled out for a new CVA with serial head CT, continue statin and antiplatelet therapy with defibrillator.  7. Diarrhea. Will continue with as needed loperamide. Follow as out patient.   Discharge Diagnoses:  Principal Problem:   Hypertensive emergency Active Problems:   ESRD on dialysis ([place])   DM (diabetes mellitus), type 2 with renal complications, diet controlled   HTN (hypertension)   Hyperlipidemia   Prostate CA (HCC)   Obesity (BMI 30-39.9)   Aortic stenosis   Hypertensive encephalopathy    Discharge Instructions   Allergies as of 10/18/2018      Reactions   Cardura [doxazosin Mesylate] Other (See Comments)   Hallucinations   Tape    Paper tape      Medication List    TAKE these medications   acetaminophen 500 MG tablet Commonly known as: TYLENOL Take 1,000 mg by mouth every 6 (six) hours as needed for mild pain.   allopurinol 100 MG tablet Commonly known as: ZYLOPRIM Take 100 mg by mouth daily.   amLODipine 10 MG tablet Commonly known as:  NORVASC Take 10 mg by mouth See admin instructions. Taking only on Tues, Thur, Sat, Sunday   aspirin 81 MG chewable tablet Chew 1 tablet (81 mg  total) by mouth daily.   atenolol 25 MG tablet Commonly known as: TENORMIN Take one tablet by mouth daily on NON HEMODIALYSIS days only   atorvastatin 40 MG tablet Commonly known as: LIPITOR Take 40 mg by mouth every evening.   calcium acetate 667 MG capsule Commonly known as: PHOSLO Take 3 capsules (2,001 mg total) by mouth 3 (three) times daily with meals.   escitalopram 10 MG tablet Commonly known as: LEXAPRO Take 10 mg by mouth daily.   ferrous sulfate 325 (65 FE) MG EC tablet Take 325 mg by mouth 2 (two) times daily with a meal.   gabapentin 100 MG capsule Commonly known as: NEURONTIN Take 100 mg by mouth at bedtime.   hydrALAZINE 50 MG tablet Commonly known as: APRESOLINE Take 1 tablet (50 mg total) by mouth 2 (two) times daily.   isosorbide mononitrate 30 MG 24 hr tablet Commonly known as: IMDUR Take 1 tablet (30 mg total) by mouth daily. What changed:   when to take this  additional instructions   lidocaine-prilocaine cream Commonly known as: EMLA Apply 1 application topically as needed (for port access).   loperamide 2 MG capsule Commonly known as: IMODIUM Take 1 capsule (2 mg total) by mouth every 6 (six) hours as needed for diarrhea or loose stools.   methocarbamol 500 MG tablet Commonly known as: ROBAXIN Take 500 mg by mouth 3 (three) times daily as needed.   multivitamin Tabs tablet Take 1 tablet by mouth 2 (two) times daily.   nitroGLYCERIN 0.4 MG SL tablet Commonly known as: NITROSTAT PLEASE SEE ATTACHED FOR DETAILED DIRECTIONS What changed: See the new instructions.   pantoprazole 40 MG tablet Commonly known as: PROTONIX Take 1 tablet (40 mg total) by mouth 2 (two) times daily. What changed: when to take this   SENSIPAR PO Take by mouth every Monday, Wednesday, and Friday.   ticagrelor 90 MG Tabs tablet Commonly known as: BRILINTA Take 1 tablet (90 mg total) by mouth 2 (two) times daily.   Velphoro 500 MG chewable tablet Generic  drug: sucroferric oxyhydroxide Chew 500 mg by mouth 3 (three) times daily with meals.   Vitamin D-3 25 MCG (1000 UT) Caps Take 1,000 Units by mouth daily.       Allergies  Allergen Reactions  . Cardura [Doxazosin Mesylate] Other (See Comments)    Hallucinations  . Tape     Paper tape    Consultations:  Nephrology   Procedures/Studies: Ct Head Wo Contrast  Result Date: 10/18/2018 CLINICAL DATA:  Ataxia, stroke suspected EXAM: CT HEAD WITHOUT CONTRAST TECHNIQUE: Contiguous axial images were obtained from the base of the skull through the vertex without intravenous contrast. COMPARISON:  CT 2 days ago 10/15/2018 FINDINGS: Brain: No intracranial hemorrhage, mass effect, or midline shift. No hydrocephalus. The basilar cisterns are patent. No evidence of territorial infarct or acute ischemia. Again seen atrophy and chronic small vessel ischemia. No extra-axial or intracranial fluid collection. Vascular: Atherosclerosis of skullbase vasculature without hyperdense vessel or abnormal calcification. Skull: No fracture or focal lesion. Sinuses/Orbits: Left scleral banding. No acute findings. Other: Stable subcutaneous scalp lesion in the right occipital region. Subcutaneous scalp vascular calcifications typically seen with end-stage renal disease. IMPRESSION: 1. No acute intracranial abnormality. 2. Unchanged atrophy and chronic small vessel ischemia. Electronically Signed   By: Keith Rake  M.D.   On: 10/18/2018 00:54   Ct Head Wo Contrast  Result Date: 10/15/2018 CLINICAL DATA:  85 year old male with altered mental status. EXAM: CT HEAD WITHOUT CONTRAST TECHNIQUE: Contiguous axial images were obtained from the base of the skull through the vertex without intravenous contrast. COMPARISON:  Head CT dated 12/09/2013 FINDINGS: Brain: There is mild age-related atrophy and chronic microvascular ischemic changes. There is no acute intracranial hemorrhage. No mass effect or midline shift. No  extra-axial fluid collection Vascular: No hyperdense vessel or unexpected calcification. Skull: Normal. Negative for fracture or focal lesion. Sinuses/Orbits: No acute finding. Left scleral band noted. Other: A 2.1 x 1.3 cm heterogeneous lesion in the subcutaneous soft tissues of the right occipital appears similar or slightly larger compared to the prior CT. This is not characterized but may represent a complex sebaceous cyst or a fat containing lesion. The relative stability over the course of 5 years suggests a benign or indolent process. IMPRESSION: 1. No acute intracranial hemorrhage. 2. Mild age-related atrophy and chronic microvascular ischemic changes. Electronically Signed   By: Anner Crete M.D.   On: 10/15/2018 20:52   Dg Chest Port 1 View  Result Date: 10/15/2018 CLINICAL DATA:  Shortness of breath. EXAM: PORTABLE CHEST 1 VIEW COMPARISON:  Radiograph 04/27/2018, report from chest CT 02/10/2018, images not available FINDINGS: Right-sided pacemaker in place. Cardiomegaly is unchanged. Unchanged mediastinal contours with aortic atherosclerosis and tortuosity. Mild vascular congestion without overt edema. Possible small left pleural effusion. No confluent airspace disease. No pneumothorax. No acute osseous abnormalities are seen. IMPRESSION: Cardiomegaly with vascular congestion. Possible small left pleural effusion. Electronically Signed   By: Keith Rake M.D.   On: 10/15/2018 20:02      Procedures:   Subjective: Patient is feeling better, no further confusion, no nausea or vomiting, continue to have diarrhea, no chest pain or dyspnea.   Discharge Exam: Vitals:   10/18/18 0359 10/18/18 0538  BP: (!) 200/91 (!) 174/88  Pulse: 77 70  Resp: (!) 24 (!) 21  Temp:    SpO2:     Vitals:   10/17/18 2100 10/18/18 0300 10/18/18 0359 10/18/18 0538  BP: (!) 147/92 (!) 193/94 (!) 200/91 (!) 174/88  Pulse: 83 89 77 70  Resp: 19 13 (!) 24 (!) 21  Temp: 98.7 F (37.1 C) 98 F (36.7 C)     TempSrc: Oral Oral    SpO2: 97% 98%    Weight:  87.9 kg    Height:        General: Not in pain or dyspnea.  Neurology: Awake and alert, non focal  E ENT: no pallor, no icterus, oral mucosa moist Cardiovascular: No JVD. S1-S2 present, rhythmic, no gallops, rubs, or murmurs. No lower extremity edema. Pulmonary: positive breath sounds bilaterally, adequate air movement, no wheezing, rhonchi or rales. Gastrointestinal. Abdomen  with no organomegaly, non tender, no rebound or guarding Skin. No rashes Musculoskeletal: no joint deformities Left arm AV fistula in place.   The results of significant diagnostics from this hospitalization (including imaging, microbiology, ancillary and laboratory) are listed below for reference.     Microbiology: Recent Results (from the past 240 hour(s))  SARS CORONAVIRUS 2 Nasal Swab Aptima Multi Swab     Status: None   Collection Time: 10/15/18 10:30 PM   Specimen: Aptima Multi Swab; Nasal Swab  Result Value Ref Range Status   SARS Coronavirus 2 NEGATIVE NEGATIVE Final    Comment: (NOTE) SARS-CoV-2 target nucleic acids are NOT DETECTED. The SARS-CoV-2 RNA  is generally detectable in upper and lower respiratory specimens during the acute phase of infection. Negative results do not preclude SARS-CoV-2 infection, do not rule out co-infections with other pathogens, and should not be used as the sole basis for treatment or other patient management decisions. Negative results must be combined with clinical observations, patient history, and epidemiological information. The expected result is Negative. Fact Sheet for Patients: SugarRoll.be Fact Sheet for Healthcare Providers: https://www.woods-mathews.com/ This test is not yet approved or cleared by the [place] FDA and  has been authorized for detection and/or diagnosis of SARS-CoV-2 by FDA under an Emergency Use Authorization (EUA). This EUA will remain  in  effect (meaning this test can be used) for the duration of the COVID-19 declaration under Section 56 4(b)(1) of the Act, 21 U.S.C. section 360bbb-3(b)(1), unless the authorization is terminated or revoked sooner. Performed at [facility], [place] [address] [place], [place] [address]      Labs: BNP (last 3 results) Recent Labs    02/10/18 0909 02/22/18 1627  BNP 922.6* 182.9*   Basic Metabolic Panel: Recent Labs  Lab 10/15/18 1932 10/15/18 1941 10/16/18 0138 10/17/18 0418 10/18/18 0254  NA 140 138 138 137 136  K 4.9 5.2* 4.4 5.5* 4.8  CL 98 100 95* 96* 96*  CO2 27  --  25 24 25   GLUCOSE 113* 104* 153* 104* 118*  BUN 33* 45* 36* 55* 28*  CREATININE 8.09* 8.30* 8.31* 10.37* 6.61*  CALCIUM 9.4  --  9.1 9.0 9.1  MG  --   --  2.0  --   --   PHOS  --   --  7.3*  --   --    Liver Function Tests: Recent Labs  Lab 10/15/18 1932 10/16/18 0138  AST 14* 15  ALT 13 13  ALKPHOS 68 57  BILITOT 0.6 0.6  PROT 7.1 6.7  ALBUMIN 3.5 3.3*   No results for input(s): LIPASE, AMYLASE in the last 168 hours. Recent Labs  Lab 10/15/18 1932  AMMONIA 24   CBC: Recent Labs  Lab 10/15/18 1941 10/16/18 0138 10/17/18 0625  WBC  --  7.4 8.8  HGB 13.3 11.6* 10.7*  HCT 39.0 37.3* 34.0*  MCV  --  91.9 92.1  PLT  --  269 272   Cardiac Enzymes: Recent Labs  Lab 10/15/18 1932  CKTOTAL 103   BNP: Invalid input(s): POCBNP CBG: Recent Labs  Lab 10/15/18 1854 10/16/18 0032 10/17/18 0833  GLUCAP 120* 104* 120*   D-Dimer No results for input(s): DDIMER in the last 72 hours. Hgb A1c No results for input(s): HGBA1C in the last 72 hours. Lipid Profile No results for input(s): CHOL, HDL, LDLCALC, TRIG, CHOLHDL, LDLDIRECT in the last 72 hours. Thyroid function studies Recent Labs    10/16/18 0138  TSH 1.824   Anemia work up No results for input(s): VITAMINB12, FOLATE, FERRITIN, TIBC, IRON, RETICCTPCT in the last 72 hours. Urinalysis    Component Value Date/Time    COLORURINE YELLOW 12/29/2014 0550   APPEARANCEUR CLOUDY (A) 12/29/2014 0550   LABSPEC 1.020 12/29/2014 0550   PHURINE 5.0 12/29/2014 0550   GLUCOSEU NEGATIVE 12/29/2014 0550   HGBUR SMALL (A) 12/29/2014 0550   BILIRUBINUR NEGATIVE 12/29/2014 0550   KETONESUR NEGATIVE 12/29/2014 0550   PROTEINUR 100 (A) 12/29/2014 0550   UROBILINOGEN 0.2 12/29/2014 0550   NITRITE NEGATIVE 12/29/2014 0550   LEUKOCYTESUR SMALL (A) 12/29/2014 0550   Sepsis Labs Invalid input(s): PROCALCITONIN,  WBC,  LACTICIDVEN  Microbiology Recent Results (from the past 240 hour(s))  SARS CORONAVIRUS 2 Nasal Swab Aptima Multi Swab     Status: None   Collection Time: 10/15/18 10:30 PM   Specimen: Aptima Multi Swab; Nasal Swab  Result Value Ref Range Status   SARS Coronavirus 2 NEGATIVE NEGATIVE Final    Comment: (NOTE) SARS-CoV-2 target nucleic acids are NOT DETECTED. The SARS-CoV-2 RNA is generally detectable in upper and lower respiratory specimens during the acute phase of infection. Negative results do not preclude SARS-CoV-2 infection, do not rule out co-infections with other pathogens, and should not be used as the sole basis for treatment or other patient management decisions. Negative results must be combined with clinical observations, patient history, and epidemiological information. The expected result is Negative. Fact Sheet for Patients: SugarRoll.be Fact Sheet for Healthcare Providers: https://www.woods-mathews.com/ This test is not yet approved or cleared by the [place] FDA and  has been authorized for detection and/or diagnosis of SARS-CoV-2 by FDA under an Emergency Use Authorization (EUA). This EUA will remain  in effect (meaning this test can be used) for the duration of the COVID-19 declaration under Section 56 4(b)(1) of the Act, 21 U.S.C. section 360bbb-3(b)(1), unless the authorization is terminated or revoked sooner. Performed at [facility], [place] [address] [place],  [address]      Time coordinating discharge: 45 minutes  SIGNED:   Tawni Millers, MD  [facility] 10/18/2018, 10:06 AM

## 2018-10-18 NOTE — Progress Notes (Addendum)
Renal Navigator notified OP HD clinic/[facility] of plan for discharge today in order to provide continuity of care.\  Akari Crysler Elizabeth, LCSW Renal Navigator [phone] 

## 2018-10-18 NOTE — Evaluation (Signed)
Physical Therapy Evaluation Patient Details Name: Sean Best MRN: [id number] DOB: Sep 21, 1933 Today's Date: 10/18/2018   History of Present Illness  85 year old male who presented with confusion and uncontrolled blood pressure.  He does have significant past medical history for end stage renal disease on hemodialysis, type II diabetes mellitus, history of CVA, prostate cancer, hypertension and dyslipidemia.  Patient was noted to have tremors, shakes and altered mentation, he was brought into the hospital for further evaluation.  Clinical Impression  Pt is close to baseline functioning and should be safe at home with available assist from family during Covid 19 isolation. There are no further acute PT needs.  Will sign off at this time.     Follow Up Recommendations No PT follow up    Equipment Recommendations       Recommendations for Other Services       Precautions / Restrictions Precautions Precautions: Fall      Mobility  Bed Mobility               General bed mobility comments: sitting EOB on arrival  Transfers Overall transfer level: Modified independent Equipment used: None             General transfer comment: cues for safer hand placement  Ambulation/Gait Ambulation/Gait assistance: Supervision Gait Distance (Feet): 200 Feet Assistive device: Rolling walker (2 wheeled) Gait Pattern/deviations: Step-through pattern   Gait velocity interpretation: 1.31 - 2.62 ft/sec, indicative of limited community ambulator General Gait Details: generally steady with RW and even steady though guarded without AD for a short distance.  Stairs            Wheelchair Mobility    Modified Rankin (Stroke Patients Only)       Balance Overall balance assessment: Needs assistance   Sitting balance-Leahy Scale: Good       Standing balance-Leahy Scale: Fair                               Pertinent Vitals/Pain Pain Assessment: No/denies pain     Home Living Family/patient expects to be discharged to:: Private residence Living Arrangements: Children Available Help at Discharge: Available PRN/intermittently;Other (Comment)(pt states there is someone available most all the time.) Type of Home: House Home Access: Stairs to enter Entrance Stairs-Rails: Right;Left Entrance Stairs-Number of Steps: 4-5 Home Layout: One level Home Equipment: Cane - single point;Walker - 2 wheels      Prior Function Level of Independence: Independent with assistive device(s)         Comments: uses cane and RW.  RW in the home and cane outside.     Hand Dominance        Extremity/Trunk Assessment   Upper Extremity Assessment Upper Extremity Assessment: Overall WFL for tasks assessed(general weakness in grip, gross flex/ext, but functional)    Lower Extremity Assessment Lower Extremity Assessment: Overall WFL for tasks assessed(general weakess in hams and hip flexors.)       Communication   Communication: No difficulties  Cognition Arousal/Alertness: Awake/alert Behavior During Therapy: WFL for tasks assessed/performed Overall Cognitive Status: (not formally tested, oriented x4 and followed commands)                                        General Comments      Exercises     Assessment/Plan    PT Assessment  Patent does not need any further PT services  PT Problem List Decreased balance;Decreased mobility       PT Treatment Interventions      PT Goals (Current goals can be found in the Care Plan section)  Acute Rehab PT Goals PT Goal Formulation: All assessment and education complete, DC therapy    Frequency     Barriers to discharge        Co-evaluation               AM-PAC PT "6 Clicks" Mobility  Outcome Measure Help needed turning from your back to your side while in a flat bed without using bedrails?: None Help needed moving from lying on your back to sitting on the side of a flat bed  without using bedrails?: None Help needed moving to and from a bed to a chair (including a wheelchair)?: None Help needed standing up from a chair using your arms (e.g., wheelchair or bedside chair)?: None Help needed to walk in hospital room?: None Help needed climbing 3-5 steps with a railing? : A Little 6 Click Score: 23    End of Session   Activity Tolerance: Patient tolerated treatment well Patient left: Other (comment);with call bell/phone within reach(sitting EOB) Nurse Communication: Mobility status PT Visit Diagnosis: Unsteadiness on feet (R26.81)    Time: 3845-3646 PT Time Calculation (min) (ACUTE ONLY): 18 min   Charges:   PT Evaluation $PT Eval Low Complexity: 1 Low          10/18/2018  Donnella Sham, PT Acute Rehabilitation Services [phone]  (pager) [phone]  (office)  Tessie Fass Krystina Strieter 10/18/2018, 12:47 PM

## 2018-10-18 NOTE — Progress Notes (Signed)
Discharged home w/ daughter, [name]. All paperwork and prescriptions given and reviewed. Both verbalized understanding. All belonging returned to patient.

## 2018-10-21 ENCOUNTER — Telehealth: Payer: Self-pay

## 2018-10-21 NOTE — Telephone Encounter (Signed)
Spoke with pts daughter regarding appt on 10/24/18. Pts daughter stated her and the pt has not been in contact with anyone who may have covid-19 and has no symptoms.

## 2018-10-24 ENCOUNTER — Ambulatory Visit (INDEPENDENT_AMBULATORY_CARE_PROVIDER_SITE_OTHER): Payer: Medicare Other | Admitting: Internal Medicine

## 2018-10-24 ENCOUNTER — Other Ambulatory Visit: Payer: Self-pay

## 2018-10-24 DIAGNOSIS — Z950 Presence of cardiac pacemaker: Secondary | ICD-10-CM

## 2018-10-24 DIAGNOSIS — R55 Syncope and collapse: Secondary | ICD-10-CM | POA: Diagnosis not present

## 2018-10-24 DIAGNOSIS — I5032 Chronic diastolic (congestive) heart failure: Secondary | ICD-10-CM

## 2018-10-24 LAB — CUP PACEART INCLINIC DEVICE CHECK
Battery Remaining Longevity: 126 mo
Battery Voltage: 2.94 V
Brady Statistic AP VP Percent: 0.65 %
Brady Statistic AP VS Percent: 0.85 %
Brady Statistic AS VP Percent: 18.49 %
Brady Statistic AS VS Percent: 80 %
Brady Statistic RA Percent Paced: 1.55 %
Brady Statistic RV Percent Paced: 19.14 %
Date Time Interrogation Session: 20200824180719
Implantable Lead Implant Date: 20190219
Implantable Lead Implant Date: 20190219
Implantable Lead Location: 753859
Implantable Lead Location: 753860
Implantable Lead Model: 3830
Implantable Lead Model: 5076
Implantable Pulse Generator Implant Date: 20190219
Lead Channel Impedance Value: 304 Ohm
Lead Channel Impedance Value: 323 Ohm
Lead Channel Impedance Value: 418 Ohm
Lead Channel Impedance Value: 437 Ohm
Lead Channel Pacing Threshold Amplitude: 0.625 V
Lead Channel Pacing Threshold Amplitude: 1.25 V
Lead Channel Pacing Threshold Pulse Width: 0.4 ms
Lead Channel Pacing Threshold Pulse Width: 0.4 ms
Lead Channel Sensing Intrinsic Amplitude: 5.375 mV
Lead Channel Sensing Intrinsic Amplitude: 9.5 mV
Lead Channel Setting Pacing Amplitude: 2 V
Lead Channel Setting Pacing Amplitude: 2.5 V
Lead Channel Setting Pacing Pulse Width: 1 ms
Lead Channel Setting Sensing Sensitivity: 1.2 mV

## 2018-10-24 NOTE — Patient Instructions (Signed)
Medication Instructions:  Your physician recommends that you continue on your current medications as directed. Please refer to the Current Medication list given to you today.  If you need a refill on your cardiac medications before your next appointment, please call your pharmacy.   Lab work: None Ordered   Testing/Procedures: None Ordered   Follow-Up: Your physician recommends that you schedule a follow-up appointment in: 1 year with Dr. Taylor  

## 2018-10-24 NOTE — Progress Notes (Signed)
HPI [name] returns today for followup. He is a pleasant 85 yo man with CHB, s/p PPM insertion, ESRD on HD, HTN, diastolic heart failure, and recent problems with loose stools and bleeding after HD. He has not had syncope. He denies chest pain and his dyspnea is class 2 unless he misses a HD session.  Allergies  Allergen Reactions  . Cardura [Doxazosin Mesylate] Other (See Comments)    Hallucinations  . Tape     Paper tape     Current Outpatient Medications  Medication Sig Dispense Refill  . acetaminophen (TYLENOL) 500 MG tablet Take 1,000 mg by mouth every 6 (six) hours as needed for mild pain.    Marland Kitchen allopurinol (ZYLOPRIM) 100 MG tablet Take 100 mg by mouth daily.      Marland Kitchen amLODipine (NORVASC) 10 MG tablet Take 10 mg by mouth See admin instructions. Taking only on Tues, Thur, Sat, Sunday    . aspirin 81 MG chewable tablet Chew 1 tablet (81 mg total) by mouth daily. 60 tablet 2  . atenolol (TENORMIN) 25 MG tablet Take one tablet by mouth daily on NON HEMODIALYSIS days only 90 tablet 3  . atorvastatin (LIPITOR) 40 MG tablet Take 40 mg by mouth every evening.   3  . calcium acetate (PHOSLO) 667 MG capsule Take 3 capsules (2,001 mg total) by mouth 3 (three) times daily with meals. 90 capsule 0  . Cholecalciferol (VITAMIN D-3) 1000 units CAPS Take 1,000 Units by mouth daily.    . Cinacalcet HCl (SENSIPAR PO) Take by mouth every Monday, Wednesday, and Friday.    . escitalopram (LEXAPRO) 10 MG tablet Take 10 mg by mouth daily.    . ferrous sulfate 325 (65 FE) MG EC tablet Take 325 mg by mouth 2 (two) times daily with a meal.     . gabapentin (NEURONTIN) 100 MG capsule Take 100 mg by mouth at bedtime.  10  . hydrALAZINE (APRESOLINE) 50 MG tablet Take 1 tablet (50 mg total) by mouth 2 (two) times daily. 60 tablet 0  . HYDROcodone-acetaminophen (NORCO/VICODIN) 5-325 MG tablet Take 1 tablet by mouth as needed for pain.    . isosorbide mononitrate (IMDUR) 30 MG 24 hr tablet Take 1 tablet  (30 mg total) by mouth daily. (Patient taking differently: Take 30 mg by mouth 2 (two) times daily. Taking on Tues, Thurs, Sat and Sunday) 60 tablet 0  . lidocaine-prilocaine (EMLA) cream Apply 1 application topically as needed (for port access).   11  . loperamide (IMODIUM) 2 MG capsule Take 1 capsule (2 mg total) by mouth every 6 (six) hours as needed for diarrhea or loose stools. 20 capsule 0  . methocarbamol (ROBAXIN) 500 MG tablet Take 500 mg by mouth 3 (three) times daily as needed.    . multivitamin (RENA-VIT) TABS tablet Take 1 tablet by mouth 2 (two) times daily.     . nitroGLYCERIN (NITROSTAT) 0.4 MG SL tablet PLEASE SEE ATTACHED FOR DETAILED DIRECTIONS (Patient taking differently: Place 0.4 mg under the tongue every 5 (five) minutes as needed for chest pain. ) 25 tablet 1  . pantoprazole (PROTONIX) 40 MG tablet Take 40 mg by mouth daily.    . ticagrelor (BRILINTA) 90 MG TABS tablet Take 1 tablet (90 mg total) by mouth 2 (two) times daily. 60 tablet 3  . VELPHORO 500 MG chewable tablet Chew 500 mg by mouth 3 (three) times daily with meals.   3   No current facility-administered medications  for this visit.      Past Medical History:  Diagnosis Date  . Anemia   . Arthritis    "knees" (04/11/2016)  . Blind left eye    S/P trauma  . Chronic total occlusion of artery of the extremities (HCC)    pt not aware of this  . Claudication ([place])   . ESRD on dialysis [facility])    "MWF; [name]" ((04/10/2016)  . ESRD on peritoneal dialysis [facility])    Started dialysis around April 2015 per son.  Has been doing peritoneal dialysis at home.     . Gout   . Hyperlipidemia   . Hypertension   . Overweight(278.02)   . Presence of permanent cardiac pacemaker    medtronic  . Prostate cancer ([place]) 1990s  . Shingles   . Stroke [facility]) ~1998   denies residual on 04/11/2016  . Type II diabetes mellitus (HCC)    diet controlled  . Umbilical hernia 0/16/0109    ROS:   All systems reviewed and  negative except as noted in the HPI.   Past Surgical History:  Procedure Laterality Date  . AV FISTULA PLACEMENT, RADIOCEPHALIC  32/35/5732   Left arm  . CAPD INSERTION N/A 03/06/2013   Procedure: LAPAROSCOPIC INSERTION CONTINUOUS AMBULATORY PERITONEAL DIALYSIS  (CAPD) CATHETER;  Surgeon: Edward Jolly, MD;  Location: [place];  Service: General;  Laterality: N/A;  . CATARACT EXTRACTION Right   . CHOLECYSTECTOMY  12/02/2017   LAPROSCOPIC  . CORONARY STENT INTERVENTION N/A 02/11/2018   Procedure: CORONARY STENT INTERVENTION;  Surgeon: Sherren Mocha, MD;  Location: [place] CV LAB;  Service: Cardiovascular;  Laterality: N/A;  . EYE SURGERY Left 1970s   for eye injury  . HERNIA REPAIR    . INSERTION PROSTATE RADIATION SEED    . LAPAROSCOPIC CHOLECYSTECTOMY SINGLE SITE WITH INTRAOPERATIVE CHOLANGIOGRAM N/A 12/02/2017   Procedure: LAPAROSCOPIC CHOLECYSTECTOMY;  Surgeon: Michael Boston, MD;  Location: [place];  Service: General;  Laterality: N/A;  . LEFT HEART CATH AND CORONARY ANGIOGRAPHY N/A 02/11/2018   Procedure: LEFT HEART CATH AND CORONARY ANGIOGRAPHY;  Surgeon: Sherren Mocha, MD;  Location: [place] CV LAB;  Service: Cardiovascular;  Laterality: N/A;  . PACEMAKER IMPLANT N/A 04/20/2017   Procedure: PACEMAKER IMPLANT;  Surgeon: Evans Lance, MD;  Location: [place] CV LAB;  Service: Cardiovascular;  Laterality: N/A;  . UMBILICAL HERNIA REPAIR N/A 03/06/2013   Procedure: HERNIA REPAIR UMBILICAL WITH MESH;  Surgeon: Edward Jolly, MD;  [facility];  Service: General;  Laterality: N/A;     Family History  Problem Relation Age of Onset  . Cancer Mother   . Heart disease Mother   . Cancer Father      Social History   Socioeconomic History  . Marital status: Widowed    Spouse name: Not on file  . Number of children: Not on file  . Years of education: Not on file  . Highest education level: Not on file  Occupational History  . Occupation: Retired  Photographer  . Financial resource strain: Not on file  . Food insecurity    Worry: Not on file    Inability: Not on file  . Transportation needs    Medical: Not on file    Non-medical: Not on file  Tobacco Use  . Smoking status: Never Smoker  . Smokeless tobacco: Never Used  Substance and Sexual Activity  . Alcohol use: No  . Drug use: No  . Sexual activity: Not  Currently  Lifestyle  . Physical activity    Days per week: Not on file    Minutes per session: Not on file  . Stress: Not on file  Relationships  . Social Herbalist on phone: Not on file    Gets together: Not on file    Attends religious service: Not on file    Active member of club or organization: Not on file    Attends meetings of clubs or organizations: Not on file    Relationship status: Not on file  . Intimate partner violence    Fear of current or ex partner: Not on file    Emotionally abused: Not on file    Physically abused: Not on file    Forced sexual activity: Not on file  Other Topics Concern  . Not on file  Social History Narrative   Patient lives with his daughter.     P - 85, BP - 142/88, R - 16 Physical Exam:  Well appearing NAD HEENT: Unremarkable Neck:  No JVD, no thyromegally Lymphatics:  No adenopathy Back:  No CVA tenderness Lungs:  Clear with no wheezes HEART:  Regular rate rhythm, no murmurs, no rubs, no clicks Abd:  soft, positive bowel sounds, no organomegally, no rebound, no guarding Ext:  2 plus pulses, no edema, no cyanosis, no clubbing, left arm fistula is present Skin:  No rashes no nodules Neuro:  CN II through XII intact, motor grossly intact  EKG - NSR with pacing induced LBBB  DEVICE  Normal device function.  See PaceArt for details.   Assess/Plan: 1. CHB - he is not conducting today and pacing all of the time. He is asymptomatic. 2. PPM - his Medtronic DDD PM is working normally. We will recheck in several months. 3. HTN - his blood pressure is minimally  elevated today. He admits to some sodium indiscretion. He has missed HD, but not in the last 2 weeks.   [name]

## 2018-10-27 ENCOUNTER — Telehealth (HOSPITAL_COMMUNITY): Payer: Self-pay

## 2018-10-27 ENCOUNTER — Other Ambulatory Visit: Payer: Self-pay

## 2018-10-27 DIAGNOSIS — Z992 Dependence on renal dialysis: Secondary | ICD-10-CM

## 2018-10-27 DIAGNOSIS — N186 End stage renal disease: Secondary | ICD-10-CM

## 2018-10-27 NOTE — Telephone Encounter (Signed)
The above patient or their representative was contacted and gave the following answers to these questions:         Do you have any of the following symptoms?  no  Fever                    Cough                   Shortness of breath  Do  you have any of the following other symptoms? no   muscle pain         vomiting,        diarrhea        rash         weakness        red eye        abdominal pain         bruising          bruising or bleeding              joint pain           severe headache    Have you been in contact with someone who was or has been sick in the past 2 weeks? no  Yes                 Unsure                         Unable to assess   Does the person that you were in contact with have any of the following symptoms?   Cough         shortness of breath           muscle pain         vomiting,            diarrhea            rash            weakness           fever            red eye           abdominal pain           bruising  or  bleeding                joint pain                severe headache               Have you  or someone you have been in contact with traveled internationally in th last month?         If yes, which countries?   Have you  or someone you have been in contact with traveled outside [place] in th last month?         If yes, which state and city?   COMMENTS OR ACTION PLAN FOR THIS PATIENT:          

## 2018-10-28 ENCOUNTER — Ambulatory Visit (INDEPENDENT_AMBULATORY_CARE_PROVIDER_SITE_OTHER): Payer: Medicare Other | Admitting: Vascular Surgery

## 2018-10-28 ENCOUNTER — Other Ambulatory Visit: Payer: Self-pay | Admitting: *Deleted

## 2018-10-28 ENCOUNTER — Ambulatory Visit (HOSPITAL_COMMUNITY)
Admission: RE | Admit: 2018-10-28 | Discharge: 2018-10-28 | Disposition: A | Discharge status: Home / Self Care | Payer: Medicare Other | Source: Ambulatory Visit | Attending: Vascular Surgery | Admitting: Vascular Surgery

## 2018-10-28 ENCOUNTER — Encounter: Payer: Self-pay | Admitting: Vascular Surgery

## 2018-10-28 ENCOUNTER — Other Ambulatory Visit: Payer: Self-pay

## 2018-10-28 VITALS — BP 170/87 | HR 70 | Temp 97.4°F | Resp 20 | Ht 65.0 in | Wt 195.0 lb

## 2018-10-28 DIAGNOSIS — N186 End stage renal disease: Secondary | ICD-10-CM

## 2018-10-28 DIAGNOSIS — Z992 Dependence on renal dialysis: Secondary | ICD-10-CM | POA: Diagnosis not present

## 2018-10-28 NOTE — Progress Notes (Signed)
Patient ID: Sean Best, male   DOB: February 28, 1934, 85 y.o.   MRN: [id number]  Reason for Consult: Follow-up   Referred by Seward Carol, MD  Subjective:     HPI:  Sean Best is a 85 y.o. male history of end-stage renal disease on dialysis via left arm AV fistula.  This was a radiocephalic fistula placed in 2012 that his function.  He is unaware of any previous fistulogram that have been performed.  Was recently hospitalized had IV placed in his left hand has had swelling since.  States he also has increased bleeding at the completion of dialysis  Past Medical History:  Diagnosis Date  . Anemia   . Arthritis    "knees" (04/11/2016)  . Blind left eye    S/P trauma  . Chronic total occlusion of artery of the extremities (HCC)    pt not aware of this  . Claudication ([place])   . ESRD on dialysis [facility])    "MWF; [name]" ((04/10/2016)  . ESRD on peritoneal dialysis [facility])    Started dialysis around April 2015 per son.  Has been doing peritoneal dialysis at home.     . Gout   . Hyperlipidemia   . Hypertension   . Overweight(278.02)   . Presence of permanent cardiac pacemaker    medtronic  . Prostate cancer ([place]) 1990s  . Shingles   . Stroke [facility]) ~1998   denies residual on 04/11/2016  . Type II diabetes mellitus (HCC)    diet controlled  . Umbilical hernia 05/23/5571   Family History  Problem Relation Age of Onset  . Cancer Mother   . Heart disease Mother   . Cancer Father    Past Surgical History:  Procedure Laterality Date  . AV FISTULA PLACEMENT, RADIOCEPHALIC  22/04/5425   Left arm  . CAPD INSERTION N/A 03/06/2013   Procedure: LAPAROSCOPIC INSERTION CONTINUOUS AMBULATORY PERITONEAL DIALYSIS  (CAPD) CATHETER;  Surgeon: Edward Jolly, MD;  Location: [place];  Service: General;  Laterality: N/A;  . CATARACT EXTRACTION Right   . CHOLECYSTECTOMY  12/02/2017   LAPROSCOPIC  . CORONARY STENT INTERVENTION N/A 02/11/2018   Procedure: CORONARY STENT INTERVENTION;   Surgeon: Sherren Mocha, MD;  Location: [place] CV LAB;  Service: Cardiovascular;  Laterality: N/A;  . EYE SURGERY Left 1970s   for eye injury  . HERNIA REPAIR    . INSERTION PROSTATE RADIATION SEED    . LAPAROSCOPIC CHOLECYSTECTOMY SINGLE SITE WITH INTRAOPERATIVE CHOLANGIOGRAM N/A 12/02/2017   Procedure: LAPAROSCOPIC CHOLECYSTECTOMY;  Surgeon: Michael Boston, MD;  Location: [place];  Service: General;  Laterality: N/A;  . LEFT HEART CATH AND CORONARY ANGIOGRAPHY N/A 02/11/2018   Procedure: LEFT HEART CATH AND CORONARY ANGIOGRAPHY;  Surgeon: Sherren Mocha, MD;  Location: [place] CV LAB;  Service: Cardiovascular;  Laterality: N/A;  . PACEMAKER IMPLANT N/A 04/20/2017   Procedure: PACEMAKER IMPLANT;  Surgeon: Evans Lance, MD;  Location: [place] CV LAB;  Service: Cardiovascular;  Laterality: N/A;  . UMBILICAL HERNIA REPAIR N/A 03/06/2013   Procedure: HERNIA REPAIR UMBILICAL WITH MESH;  Surgeon: Edward Jolly, MD;  Location: [facility] OR;  Service: General;  Laterality: N/A;    Short Social History:  Social History   Tobacco Use  . Smoking status: Never Smoker  . Smokeless tobacco: Never Used  Substance Use Topics  . Alcohol use: No    Allergies  Allergen Reactions  . Cardura [Doxazosin Mesylate] Other (See Comments)    Hallucinations  . Tape  Paper tape    Current Outpatient Medications  Medication Sig Dispense Refill  . acetaminophen (TYLENOL) 500 MG tablet Take 1,000 mg by mouth every 6 (six) hours as needed for mild pain.    Marland Kitchen allopurinol (ZYLOPRIM) 100 MG tablet Take 100 mg by mouth daily.      Marland Kitchen amLODipine (NORVASC) 10 MG tablet Take 10 mg by mouth See admin instructions. Taking only on Tues, Thur, Sat, Sunday    . aspirin 81 MG chewable tablet Chew 1 tablet (81 mg total) by mouth daily. 60 tablet 2  . atenolol (TENORMIN) 25 MG tablet Take one tablet by mouth daily on NON HEMODIALYSIS days only 90 tablet 3  . atorvastatin (LIPITOR) 40 MG tablet Take 40 mg by  mouth every evening.   3  . calcium acetate (PHOSLO) 667 MG capsule Take 3 capsules (2,001 mg total) by mouth 3 (three) times daily with meals. 90 capsule 0  . Cholecalciferol (VITAMIN D-3) 1000 units CAPS Take 1,000 Units by mouth daily.    . Cinacalcet HCl (SENSIPAR PO) Take by mouth every Monday, Wednesday, and Friday.    . escitalopram (LEXAPRO) 10 MG tablet Take 10 mg by mouth daily.    . ferrous sulfate 325 (65 FE) MG EC tablet Take 325 mg by mouth 2 (two) times daily with a meal.     . gabapentin (NEURONTIN) 100 MG capsule Take 100 mg by mouth at bedtime.  10  . hydrALAZINE (APRESOLINE) 50 MG tablet Take 1 tablet (50 mg total) by mouth 2 (two) times daily. 60 tablet 0  . HYDROcodone-acetaminophen (NORCO/VICODIN) 5-325 MG tablet Take 1 tablet by mouth as needed for pain.    . isosorbide mononitrate (IMDUR) 30 MG 24 hr tablet Take 1 tablet (30 mg total) by mouth daily. (Patient taking differently: Take 30 mg by mouth 2 (two) times daily. Taking on Tues, Thurs, Sat and Sunday) 60 tablet 0  . lidocaine-prilocaine (EMLA) cream Apply 1 application topically as needed (for port access).   11  . loperamide (IMODIUM) 2 MG capsule Take 1 capsule (2 mg total) by mouth every 6 (six) hours as needed for diarrhea or loose stools. 20 capsule 0  . methocarbamol (ROBAXIN) 500 MG tablet Take 500 mg by mouth 3 (three) times daily as needed.    . multivitamin (RENA-VIT) TABS tablet Take 1 tablet by mouth 2 (two) times daily.     . nitroGLYCERIN (NITROSTAT) 0.4 MG SL tablet PLEASE SEE ATTACHED FOR DETAILED DIRECTIONS (Patient taking differently: Place 0.4 mg under the tongue every 5 (five) minutes as needed for chest pain. ) 25 tablet 1  . pantoprazole (PROTONIX) 40 MG tablet Take 40 mg by mouth daily.    . ticagrelor (BRILINTA) 90 MG TABS tablet Take 1 tablet (90 mg total) by mouth 2 (two) times daily. 60 tablet 3  . VELPHORO 500 MG chewable tablet Chew 500 mg by mouth 3 (three) times daily with meals.   3    No current facility-administered medications for this visit.     Review of Systems  Constitutional:  Constitutional negative. HENT: HENT negative.  Eyes: Eyes negative.  Cardiovascular: Cardiovascular negative.  Musculoskeletal: Musculoskeletal negative.       Arm swelling and left Skin: Skin negative.  Neurological: Neurological negative. Hematologic: Positive for bruises/bleeds easily.        Objective:  Objective   Vitals:   10/28/18 1031  BP: (!) 170/87  Pulse: 70  Resp: 20  Temp: (!) 97.4 F (36.3  C)  SpO2: 99%  Weight: 195 lb (88.5 kg)  Height: 5\' 5"  (1.651 m)   Body mass index is 32.45 kg/m.  Physical Exam HENT:     Head: Normocephalic.  Eyes:     Pupils: Pupils are equal, round, and reactive to light.  Neck:     Musculoskeletal: Normal range of motion.  Cardiovascular:     Rate and Rhythm: Normal rate.  Abdominal:     General: Abdomen is flat.     Palpations: Abdomen is soft.  Musculoskeletal:        General: Swelling present.     Comments: Fistula in the forearm is serpiginous.  There are 2 areas of pseudoaneurysmal dilatation the skin appears to be intact overlying these 2 areas.  Skin:    General: Skin is warm and dry.     Capillary Refill: Capillary refill takes less than 2 seconds.  Neurological:     General: No focal deficit present.     Mental Status: He is alert.  Psychiatric:        Mood and Affect: Mood normal.        Behavior: Behavior normal.        Thought Content: Thought content normal.        Judgment: Judgment normal.     Data: I have independently interpreted his dialysis duplex which demonstrates flow volume 763 mL/min.  There is aneurysmal dilatation throughout.  There is 1 large competing branch 0.4 cm.     Assessment/Plan:     85 year old male with end-stage renal disease.  He presents for evaluation of his left arm fistula.  Does have extended bleeding times after dialysis also has swelling of his left hand.  Dialysis  duplex demonstrates a large fistula with significant thrombus burden particularly near the AV anastomosis.  We will plan fistulogram to evaluate the outflow.  Possible this will need to have have balloon angioplasty of the outflow versus conversion to an upper arm fistula given that it is a years old with multiple areas of pseudoaneurysm.  I discussed the possible outcomes with the patient he demonstrates good understanding we will get him set up for fistulogram on a nondialysis day in the near future.     Waynetta Sandy MD [facility]

## 2018-10-28 NOTE — H&P (View-Only) (Signed)
Patient ID: Sean Best, male   DOB: [birth year], 85 y.o.   MRN: [id number]  Reason for Consult: Follow-up   Referred by Seward Carol, MD  Subjective:     HPI:  Sean Best is a 85 y.o. male history of end-stage renal disease on dialysis via left arm AV fistula.  This was a radiocephalic fistula placed in 2012 that his function.  He is unaware of any previous fistulogram that have been performed.  Was recently hospitalized had IV placed in his left hand has had swelling since.  States he also has increased bleeding at the completion of dialysis  Past Medical History:  Diagnosis Date  . Anemia   . Arthritis    "knees" (04/11/2016)  . Blind left eye    S/P trauma  . Chronic total occlusion of artery of the extremities (HCC)    pt not aware of this  . Claudication ([place])   . ESRD on dialysis [facility])    "MWF; [name]" ((04/10/2016)  . ESRD on peritoneal dialysis [facility])    Started dialysis around April 2015 per son.  Has been doing peritoneal dialysis at home.     . Gout   . Hyperlipidemia   . Hypertension   . Overweight(278.02)   . Presence of permanent cardiac pacemaker    medtronic  . Prostate cancer ([place]) 1990s  . Shingles   . Stroke [facility]) ~1998   denies residual on 04/11/2016  . Type II diabetes mellitus (HCC)    diet controlled  . Umbilical hernia 06/17/4079   Family History  Problem Relation Age of Onset  . Cancer Mother   . Heart disease Mother   . Cancer Father    Past Surgical History:  Procedure Laterality Date  . AV FISTULA PLACEMENT, RADIOCEPHALIC  44/81/8563   Left arm  . CAPD INSERTION N/A 03/06/2013   Procedure: LAPAROSCOPIC INSERTION CONTINUOUS AMBULATORY PERITONEAL DIALYSIS  (CAPD) CATHETER;  Surgeon: Edward Jolly, MD;  Location: [place];  Service: General;  Laterality: N/A;  . CATARACT EXTRACTION Right   . CHOLECYSTECTOMY  12/02/2017   LAPROSCOPIC  . CORONARY STENT INTERVENTION N/A 02/11/2018   Procedure: CORONARY STENT INTERVENTION;   Surgeon: Sherren Mocha, MD;  Location: [place] CV LAB;  Service: Cardiovascular;  Laterality: N/A;  . EYE SURGERY Left 1970s   for eye injury  . HERNIA REPAIR    . INSERTION PROSTATE RADIATION SEED    . LAPAROSCOPIC CHOLECYSTECTOMY SINGLE SITE WITH INTRAOPERATIVE CHOLANGIOGRAM N/A 12/02/2017   Procedure: LAPAROSCOPIC CHOLECYSTECTOMY;  Surgeon: Michael Boston, MD;  Location: [place];  Service: General;  Laterality: N/A;  . LEFT HEART CATH AND CORONARY ANGIOGRAPHY N/A 02/11/2018   Procedure: LEFT HEART CATH AND CORONARY ANGIOGRAPHY;  Surgeon: Sherren Mocha, MD;  Location: [place] CV LAB;  Service: Cardiovascular;  Laterality: N/A;  . PACEMAKER IMPLANT N/A 04/20/2017   Procedure: PACEMAKER IMPLANT;  Surgeon: Evans Lance, MD;  Location: [place] CV LAB;  Service: Cardiovascular;  Laterality: N/A;  . UMBILICAL HERNIA REPAIR N/A 03/06/2013   Procedure: HERNIA REPAIR UMBILICAL WITH MESH;  Surgeon: Edward Jolly, MD;  [facility];  Service: General;  Laterality: N/A;    Short Social History:  Social History   Tobacco Use  . Smoking status: Never Smoker  . Smokeless tobacco: Never Used  Substance Use Topics  . Alcohol use: No    Allergies  Allergen Reactions  . Cardura [Doxazosin Mesylate] Other (See Comments)    Hallucinations  . Tape  Paper tape    Current Outpatient Medications  Medication Sig Dispense Refill  . acetaminophen (TYLENOL) 500 MG tablet Take 1,000 mg by mouth every 6 (six) hours as needed for mild pain.    Marland Kitchen allopurinol (ZYLOPRIM) 100 MG tablet Take 100 mg by mouth daily.      Marland Kitchen amLODipine (NORVASC) 10 MG tablet Take 10 mg by mouth See admin instructions. Taking only on Tues, Thur, Sat, Sunday    . aspirin 81 MG chewable tablet Chew 1 tablet (81 mg total) by mouth daily. 60 tablet 2  . atenolol (TENORMIN) 25 MG tablet Take one tablet by mouth daily on NON HEMODIALYSIS days only 90 tablet 3  . atorvastatin (LIPITOR) 40 MG tablet Take 40 mg by  mouth every evening.   3  . calcium acetate (PHOSLO) 667 MG capsule Take 3 capsules (2,001 mg total) by mouth 3 (three) times daily with meals. 90 capsule 0  . Cholecalciferol (VITAMIN D-3) 1000 units CAPS Take 1,000 Units by mouth daily.    . Cinacalcet HCl (SENSIPAR PO) Take by mouth every Monday, Wednesday, and Friday.    . escitalopram (LEXAPRO) 10 MG tablet Take 10 mg by mouth daily.    . ferrous sulfate 325 (65 FE) MG EC tablet Take 325 mg by mouth 2 (two) times daily with a meal.     . gabapentin (NEURONTIN) 100 MG capsule Take 100 mg by mouth at bedtime.  10  . hydrALAZINE (APRESOLINE) 50 MG tablet Take 1 tablet (50 mg total) by mouth 2 (two) times daily. 60 tablet 0  . HYDROcodone-acetaminophen (NORCO/VICODIN) 5-325 MG tablet Take 1 tablet by mouth as needed for pain.    . isosorbide mononitrate (IMDUR) 30 MG 24 hr tablet Take 1 tablet (30 mg total) by mouth daily. (Patient taking differently: Take 30 mg by mouth 2 (two) times daily. Taking on Tues, Thurs, Sat and Sunday) 60 tablet 0  . lidocaine-prilocaine (EMLA) cream Apply 1 application topically as needed (for port access).   11  . loperamide (IMODIUM) 2 MG capsule Take 1 capsule (2 mg total) by mouth every 6 (six) hours as needed for diarrhea or loose stools. 20 capsule 0  . methocarbamol (ROBAXIN) 500 MG tablet Take 500 mg by mouth 3 (three) times daily as needed.    . multivitamin (RENA-VIT) TABS tablet Take 1 tablet by mouth 2 (two) times daily.     . nitroGLYCERIN (NITROSTAT) 0.4 MG SL tablet PLEASE SEE ATTACHED FOR DETAILED DIRECTIONS (Patient taking differently: Place 0.4 mg under the tongue every 5 (five) minutes as needed for chest pain. ) 25 tablet 1  . pantoprazole (PROTONIX) 40 MG tablet Take 40 mg by mouth daily.    . ticagrelor (BRILINTA) 90 MG TABS tablet Take 1 tablet (90 mg total) by mouth 2 (two) times daily. 60 tablet 3  . VELPHORO 500 MG chewable tablet Chew 500 mg by mouth 3 (three) times daily with meals.   3    No current facility-administered medications for this visit.     Review of Systems  Constitutional:  Constitutional negative. HENT: HENT negative.  Eyes: Eyes negative.  Cardiovascular: Cardiovascular negative.  Musculoskeletal: Musculoskeletal negative.       Arm swelling and left Skin: Skin negative.  Neurological: Neurological negative. Hematologic: Positive for bruises/bleeds easily.        Objective:  Objective   Vitals:   10/28/18 1031  BP: (!) 170/87  Pulse: 70  Resp: 20  Temp: (!) 97.4 F (36.3  C)  SpO2: 99%  Weight: 195 lb (88.5 kg)  Height: 5\' 5"  (1.651 m)   Body mass index is 32.45 kg/m.  Physical Exam HENT:     Head: Normocephalic.  Eyes:     Pupils: Pupils are equal, round, and reactive to light.  Neck:     Musculoskeletal: Normal range of motion.  Cardiovascular:     Rate and Rhythm: Normal rate.  Abdominal:     General: Abdomen is flat.     Palpations: Abdomen is soft.  Musculoskeletal:        General: Swelling present.     Comments: Fistula in the forearm is serpiginous.  There are 2 areas of pseudoaneurysmal dilatation the skin appears to be intact overlying these 2 areas.  Skin:    General: Skin is warm and dry.     Capillary Refill: Capillary refill takes less than 2 seconds.  Neurological:     General: No focal deficit present.     Mental Status: He is alert.  Psychiatric:        Mood and Affect: Mood normal.        Behavior: Behavior normal.        Thought Content: Thought content normal.        Judgment: Judgment normal.     Data: I have independently interpreted his dialysis duplex which demonstrates flow volume 763 mL/min.  There is aneurysmal dilatation throughout.  There is 1 large competing branch 0.4 cm.     Assessment/Plan:     85 year old male with end-stage renal disease.  He presents for evaluation of his left arm fistula.  Does have extended bleeding times after dialysis also has swelling of his left hand.  Dialysis  duplex demonstrates a large fistula with significant thrombus burden particularly near the AV anastomosis.  We will plan fistulogram to evaluate the outflow.  Possible this will need to have have balloon angioplasty of the outflow versus conversion to an upper arm fistula given that it is a years old with multiple areas of pseudoaneurysm.  I discussed the possible outcomes with the patient he demonstrates good understanding we will get him set up for fistulogram on a nondialysis day in the near future.     Waynetta Sandy MD [facility]

## 2018-10-29 ENCOUNTER — Other Ambulatory Visit (HOSPITAL_COMMUNITY)
Admission: RE | Admit: 2018-10-29 | Discharge: 2018-10-29 | Disposition: A | Discharge status: Home / Self Care | Payer: Medicare Other | Source: Ambulatory Visit | Attending: Surgery | Admitting: Surgery

## 2018-10-29 DIAGNOSIS — Z01812 Encounter for preprocedural laboratory examination: Secondary | ICD-10-CM | POA: Insufficient documentation

## 2018-10-29 DIAGNOSIS — Z20828 Contact with and (suspected) exposure to other viral communicable diseases: Secondary | ICD-10-CM | POA: Insufficient documentation

## 2018-10-29 LAB — SARS CORONAVIRUS 2 (TAT 6-24 HRS): SARS Coronavirus 2: NEGATIVE

## 2018-11-01 ENCOUNTER — Encounter (HOSPITAL_COMMUNITY)
Admission: RE | Disposition: A | Discharge status: Home / Self Care | Payer: Self-pay | Source: Home / Self Care | Attending: Surgery

## 2018-11-01 ENCOUNTER — Other Ambulatory Visit: Payer: Self-pay

## 2018-11-01 ENCOUNTER — Encounter (HOSPITAL_COMMUNITY): Payer: Self-pay | Admitting: Surgery

## 2018-11-01 ENCOUNTER — Ambulatory Visit (HOSPITAL_COMMUNITY)
Admission: RE | Admit: 2018-11-01 | Discharge: 2018-11-01 | Disposition: A | Discharge status: Home / Self Care | Payer: Medicare Other | Attending: Surgery | Admitting: Surgery

## 2018-11-01 DIAGNOSIS — Z888 Allergy status to other drugs, medicaments and biological substances status: Secondary | ICD-10-CM | POA: Insufficient documentation

## 2018-11-01 DIAGNOSIS — Z8673 Personal history of transient ischemic attack (TIA), and cerebral infarction without residual deficits: Secondary | ICD-10-CM | POA: Diagnosis not present

## 2018-11-01 DIAGNOSIS — Z7982 Long term (current) use of aspirin: Secondary | ICD-10-CM | POA: Insufficient documentation

## 2018-11-01 DIAGNOSIS — E1151 Type 2 diabetes mellitus with diabetic peripheral angiopathy without gangrene: Secondary | ICD-10-CM | POA: Insufficient documentation

## 2018-11-01 DIAGNOSIS — Z8546 Personal history of malignant neoplasm of prostate: Secondary | ICD-10-CM | POA: Insufficient documentation

## 2018-11-01 DIAGNOSIS — H5462 Unqualified visual loss, left eye, normal vision right eye: Secondary | ICD-10-CM | POA: Diagnosis not present

## 2018-11-01 DIAGNOSIS — Z992 Dependence on renal dialysis: Secondary | ICD-10-CM | POA: Insufficient documentation

## 2018-11-01 DIAGNOSIS — E785 Hyperlipidemia, unspecified: Secondary | ICD-10-CM | POA: Diagnosis not present

## 2018-11-01 DIAGNOSIS — Z8249 Family history of ischemic heart disease and other diseases of the circulatory system: Secondary | ICD-10-CM | POA: Insufficient documentation

## 2018-11-01 DIAGNOSIS — Z950 Presence of cardiac pacemaker: Secondary | ICD-10-CM | POA: Diagnosis not present

## 2018-11-01 DIAGNOSIS — T82858A Stenosis of vascular prosthetic devices, implants and grafts, initial encounter: Secondary | ICD-10-CM | POA: Insufficient documentation

## 2018-11-01 DIAGNOSIS — Y841 Kidney dialysis as the cause of abnormal reaction of the patient, or of later complication, without mention of misadventure at the time of the procedure: Secondary | ICD-10-CM | POA: Insufficient documentation

## 2018-11-01 DIAGNOSIS — N186 End stage renal disease: Secondary | ICD-10-CM | POA: Diagnosis not present

## 2018-11-01 DIAGNOSIS — E1122 Type 2 diabetes mellitus with diabetic chronic kidney disease: Secondary | ICD-10-CM | POA: Insufficient documentation

## 2018-11-01 DIAGNOSIS — M1990 Unspecified osteoarthritis, unspecified site: Secondary | ICD-10-CM | POA: Diagnosis not present

## 2018-11-01 DIAGNOSIS — I120 Hypertensive chronic kidney disease with stage 5 chronic kidney disease or end stage renal disease: Secondary | ICD-10-CM | POA: Diagnosis not present

## 2018-11-01 DIAGNOSIS — Z955 Presence of coronary angioplasty implant and graft: Secondary | ICD-10-CM | POA: Insufficient documentation

## 2018-11-01 DIAGNOSIS — Z79899 Other long term (current) drug therapy: Secondary | ICD-10-CM | POA: Insufficient documentation

## 2018-11-01 DIAGNOSIS — T82898A Other specified complication of vascular prosthetic devices, implants and grafts, initial encounter: Secondary | ICD-10-CM

## 2018-11-01 HISTORY — PX: A/V FISTULAGRAM: CATH118298

## 2018-11-01 HISTORY — PX: PERIPHERAL VASCULAR BALLOON ANGIOPLASTY: CATH118281

## 2018-11-01 LAB — POCT I-STAT, CHEM 8
BUN: 17 mg/dL (ref 8–23)
Calcium, Ion: 1.11 mmol/L — ABNORMAL LOW (ref 1.15–1.40)
Chloride: 95 mmol/L — ABNORMAL LOW (ref 98–111)
Creatinine, Ser: 5.9 mg/dL — ABNORMAL HIGH (ref 0.61–1.24)
Glucose, Bld: 85 mg/dL (ref 70–99)
HCT: 35 % — ABNORMAL LOW (ref 39.0–52.0)
Hemoglobin: 11.9 g/dL — ABNORMAL LOW (ref 13.0–17.0)
Potassium: 3.8 mmol/L (ref 3.5–5.1)
Sodium: 138 mmol/L (ref 135–145)
TCO2: 30 mmol/L (ref 22–32)

## 2018-11-01 LAB — GLUCOSE, CAPILLARY: Glucose-Capillary: 85 mg/dL (ref 70–99)

## 2018-11-01 SURGERY — A/V FISTULAGRAM
Anesthesia: LOCAL

## 2018-11-01 MED ORDER — SODIUM CHLORIDE 0.9 % IV SOLN: 250.0000 mL | INTRAVENOUS | Status: DC | PRN

## 2018-11-01 MED ORDER — LIDOCAINE HCL (PF) 1 % IJ SOLN
INTRAMUSCULAR | Status: AC
  Filled 2018-11-01: qty 30

## 2018-11-01 MED ORDER — HEPARIN (PORCINE) IN NACL 1000-0.9 UT/500ML-% IV SOLN
INTRAVENOUS | Status: AC
  Filled 2018-11-01: qty 500

## 2018-11-01 MED ORDER — MIDAZOLAM HCL 2 MG/2ML IJ SOLN
INTRAMUSCULAR | Status: AC
  Filled 2018-11-01: qty 2

## 2018-11-01 MED ORDER — LIDOCAINE HCL (PF) 1 % IJ SOLN
INTRAMUSCULAR | Status: DC | PRN
  Administered 2018-11-01: 5 mL

## 2018-11-01 MED ORDER — MIDAZOLAM HCL 2 MG/2ML IJ SOLN
INTRAMUSCULAR | Status: DC | PRN
  Administered 2018-11-01: 1 mg via INTRAVENOUS

## 2018-11-01 MED ORDER — IODIXANOL 320 MG/ML IV SOLN
INTRAVENOUS | Status: DC | PRN
  Administered 2018-11-01: 75 mL via INTRAVENOUS

## 2018-11-01 MED ORDER — FENTANYL CITRATE (PF) 100 MCG/2ML IJ SOLN
INTRAMUSCULAR | Status: AC
  Filled 2018-11-01: qty 2

## 2018-11-01 MED ORDER — SODIUM CHLORIDE 0.9% FLUSH: 3.0000 mL | Freq: Two times a day (BID) | INTRAVENOUS | Status: DC

## 2018-11-01 MED ORDER — HEPARIN (PORCINE) IN NACL 1000-0.9 UT/500ML-% IV SOLN
INTRAVENOUS | Status: DC | PRN
  Administered 2018-11-01: 500 mL

## 2018-11-01 MED ORDER — FENTANYL CITRATE (PF) 100 MCG/2ML IJ SOLN
INTRAMUSCULAR | Status: DC | PRN
  Administered 2018-11-01 (×2): 25 ug via INTRAVENOUS

## 2018-11-01 MED ORDER — SODIUM CHLORIDE 0.9% FLUSH: 3.0000 mL | INTRAVENOUS | Status: DC | PRN

## 2018-11-01 SURGICAL SUPPLY — 16 items
BALLN MUSTANG 7X80X75 (BALLOONS) ×3
BALLOON MUSTANG 7X80X75 (BALLOONS) ×2 IMPLANT
CATH BEACON 5 .035 65 KMP TIP (CATHETERS) ×3 IMPLANT
COVER DOME SNAP 22 D (MISCELLANEOUS) ×3 IMPLANT
DEVICE TORQUE H2O (MISCELLANEOUS) ×3 IMPLANT
GUIDEWIRE ANGLED .035X150CM (WIRE) ×3 IMPLANT
KIT ENCORE 26 ADVANTAGE (KITS) ×3 IMPLANT
KIT MICROPUNCTURE NIT STIFF (SHEATH) ×3 IMPLANT
PACK CARDIAC CATHETERIZATION (CUSTOM PROCEDURE TRAY) ×3 IMPLANT
PROTECTION STATION PRESSURIZED (MISCELLANEOUS) ×3
SHEATH PINNACLE R/O II 6F 4CM (SHEATH) ×3 IMPLANT
SHEATH PROBE COVER 6X72 (BAG) ×3 IMPLANT
STATION PROTECTION PRESSURIZED (MISCELLANEOUS) ×2 IMPLANT
STOPCOCK MORSE 400PSI 3WAY (MISCELLANEOUS) ×3 IMPLANT
TUBING CIL FLEX 10 FLL-RA (TUBING) ×3 IMPLANT
WIRE BENTSON .035X145CM (WIRE) ×3 IMPLANT

## 2018-11-01 NOTE — Discharge Instructions (Signed)

## 2018-11-01 NOTE — Op Note (Signed)
    Patient name: Sean Best MRN: [id number] DOB: Feb 28, 1934 Sex: male  11/01/2018 Pre-operative Diagnosis: Left hand swelling Post-operative diagnosis:  Same Surgeon:  Annamarie Major Procedure Performed:  1.  Ultrasound-guided access, left cephalic vein  2.  Fistulogram  3.  Venoplasty, left cephalic vein (peripheral)  4.  Conscious sedation (25 minutes)   Indications: The patient has had swelling in his hand since an IV was placed.  He is also had some issues with bleeding after dialysis and so he comes in today for fistulogram  Procedure:  The patient was identified in the holding area and taken to room 8.  The patient was then placed supine on the table and prepped and draped in the usual sterile fashion.  A time out was called.  Conscious sedation was administered with the use of IV fentanyl and Versed under continuous physician and nurse monitoring.  Heart rate, blood pressure, and oxygen saturations were continuously monitored.  Total sedation time was 26 minutes ultrasound was used to evaluate the fistula.  The vein was patent and compressible.  A digital ultrasound image was acquired.  The fistula was then accessed under ultrasound guidance using a micropuncture needle.  An 018 wire was then asvanced without resistance and a micropuncture sheath was placed.  Contrast injections were then performed through the sheath.  Findings: No evidence of central venous stenosis.  The cephalic vein is widely patent in the upper arm.  In the forearm, the fistula branches into the deep system.  There are 2 large areas of aneurysmal degeneration.  Between these 2 aneurysms there is a luminal irregularity consistent with a greater than 50% stenosis.  The arterial anastomosis was not well-visualized due to the multiple collaterals preventing reflux across the anastomosis.   Intervention: After the above images were acquired decision made to proceed with intervention.  A 6 French sheath was placed.  I had to  use a Kumpe catheter and a Glidewire to navigate through the aneurysmal and tortuous fistula.  Once this was done a Bentson wire was placed.  I then performed balloon angioplasty using a 7 x 80 balloon of the area of luminal narrowing.  Follow-up imaging showed stenosis less than 20%.  Catheters and wires were removed.  4-0 Monocryl was used for closure.  There were no complications  Impression:  #1  Limit irregularity between 2 large areas of aneurysmal change within the fistula with stenosis greater than 50%, successfully treated with balloon angioplasty using a 7 mm balloon with residual stenosis less than 20%  #2  Difficult to interrogate the arterial venous anastomosis due to the numerous collaterals in the proximal fistula  #3  No central venous stenosis    V. Annamarie Major, M.D., [facility] [place] Office: [phone] Pager:  [phone]

## 2018-11-01 NOTE — Interval H&P Note (Signed)
History and Physical Interval Note:  11/01/2018 8:06 AM  Sean Best  has presented today for surgery, with the diagnosis of poor flow in fistula.  The various methods of treatment have been discussed with the patient and family. After consideration of risks, benefits and other options for treatment, the patient has consented to  Procedure(s): A/V FISTULAGRAM (N/A) as a surgical intervention.  The patient's history has been reviewed, patient examined, no change in status, stable for surgery.  I have reviewed the patient's chart and labs.  Questions were answered to the patient's satisfaction.     Annamarie Major

## 2018-11-08 ENCOUNTER — Other Ambulatory Visit: Payer: Self-pay

## 2018-11-08 ENCOUNTER — Ambulatory Visit (INDEPENDENT_AMBULATORY_CARE_PROVIDER_SITE_OTHER): Payer: Medicare Other | Admitting: Orthopaedic Surgery

## 2018-11-08 ENCOUNTER — Encounter: Payer: Self-pay | Admitting: Orthopaedic Surgery

## 2018-11-08 ENCOUNTER — Ambulatory Visit: Payer: Self-pay

## 2018-11-08 VITALS — BP 175/82 | HR 77 | Ht 65.0 in | Wt 191.0 lb

## 2018-11-08 DIAGNOSIS — G8929 Other chronic pain: Secondary | ICD-10-CM | POA: Diagnosis not present

## 2018-11-08 DIAGNOSIS — M25511 Pain in right shoulder: Secondary | ICD-10-CM | POA: Diagnosis not present

## 2018-11-08 DIAGNOSIS — M7541 Impingement syndrome of right shoulder: Secondary | ICD-10-CM

## 2018-11-08 MED ORDER — LIDOCAINE HCL 1 % IJ SOLN
2.0000 mL | INTRAMUSCULAR | Status: AC | PRN
  Administered 2018-11-08: 2 mL

## 2018-11-08 MED ORDER — BUPIVACAINE HCL 0.25 % IJ SOLN
2.0000 mL | INTRAMUSCULAR | Status: AC | PRN
  Administered 2018-11-08: 2 mL via INTRA_ARTICULAR

## 2018-11-08 MED ORDER — METHYLPREDNISOLONE ACETATE 40 MG/ML IJ SUSP
40.0000 mg | INTRAMUSCULAR | Status: AC | PRN
  Administered 2018-11-08: 40 mg via INTRA_ARTICULAR

## 2018-11-08 NOTE — Progress Notes (Signed)
Office Visit Note   Patient: Sean Best           Date of Birth: 1933/11/22           MRN: [id number] Visit Date: 11/08/2018              Requested by: Seward Carol, MD [address] [place],  [place] [address] PCP: Seward Carol, MD   Assessment & Plan: Visit Diagnoses:  1. Chronic right shoulder pain   2. Impingement syndrome of right shoulder     Plan:  #1: Corticosteroid injection through a posterior approach intra-articularly and subacromially was accomplished atraumatically.  Excellent results postinjection. #2: At this time he will follow back up with [place] in 2 weeks and we will inject his left shoulder. #3: We will check his glucoses during the day today.  Follow-Up Instructions: Return in about 2 weeks (around 11/22/2018) for injection of left shoulder.   Orders:  Orders Placed This Encounter  Procedures  . XR Shoulder Right   No orders of the defined types were placed in this encounter.     Procedures: Large Joint Inj: R glenohumeral (And subacromially) on 11/08/2018 12:08 PM Indications: pain and diagnostic evaluation Details: 25 G 1.5 in needle, posterior approach  Arthrogram: No  Medications: 2 mL lidocaine 1 %; 2 mL bupivacaine 0.25 %; 40 mg methylPREDNISolone acetate 40 MG/ML Outcome: tolerated well, no immediate complications  Good results with motion and pain post injection Procedure, treatment alternatives, risks and benefits explained, specific risks discussed. Consent was given by the patient. Immediately prior to procedure a time out was called to verify the correct patient, procedure, equipment, support staff and site/side marked as required. Patient was prepped and draped in the usual sterile fashion.       Clinical Data: No additional findings.   Subjective: Chief Complaint  Patient presents with  . Left Shoulder - Pain  . Right Shoulder - Pain    HPI  Patient presents today with bilateral shoulder pain. He was last  here for his left shoulder on March 14, 2018. Patient states that the injection helped, but has been hurting again for two months. His right shoulder started hurting two months ago as well. No known injury. His pain is located superiorly in both shoulder, along with weakness in his right shoulder. He takes tylenol for pain as needed.    Review of Systems  Constitutional: Negative for fatigue.  HENT: Negative for ear pain.   Eyes: Negative for pain.  Respiratory: Negative for shortness of breath.   Cardiovascular: Positive for leg swelling.  Gastrointestinal: Negative for constipation and diarrhea.  Endocrine: Negative for cold intolerance and heat intolerance.  Genitourinary: Negative for difficulty urinating.  Musculoskeletal: Negative for joint swelling.  Skin: Negative for rash.  Allergic/Immunologic: Negative for food allergies.  Neurological: Positive for weakness.  Hematological: Does not bruise/bleed easily.  Psychiatric/Behavioral: Negative for sleep disturbance.     Objective: Vital Signs: BP (!) 175/82   Pulse 77   Ht 5\' 5"  (1.651 m)   Wt 191 lb (86.6 kg)   BMI 31.78 kg/m   Physical Exam Constitutional:      Appearance: He is well-developed.  HENT:     Head: Normocephalic.  Eyes:     Pupils: Pupils are equal, round, and reactive to light.  Pulmonary:     Effort: Pulmonary effort is normal.  Skin:    General: Skin is warm and dry.  Neurological:     Mental  Status: He is alert and oriented to person, place, and time.  Psychiatric:        Behavior: Behavior normal.     Ortho Exam  Exam today reveals skin to be intact.  He does have a little bit of atrophy of the supraspinatus.  Range of motion to about 120 degrees of forward flexion 90 degrees of abduction.  With the arm at 90 degrees of abduction has around 30 degrees of internal rotation 45 degrees of external rotation.  Positive empty can test.  Neurovascular intact distally.  Specialty Comments:  No  specialty comments available.  Imaging: Xr Shoulder Right  Result Date: 11/08/2018 4 view x-ray of the right shoulder reveals pacemaker generator overlying the right hemithorax.  He has a type I acromium.  Little bit of separation of the [facility] joint.  Does have some narrowing of the glenohumeral joint.    PMFS History: Current Outpatient Medications  Medication Sig Dispense Refill  . allopurinol (ZYLOPRIM) 100 MG tablet Take 100 mg by mouth daily.      Marland Kitchen amLODipine (NORVASC) 10 MG tablet Take 10 mg by mouth See admin instructions. Taking only on Tues, Thur, Sat, Sunday    . aspirin 81 MG chewable tablet Chew 1 tablet (81 mg total) by mouth daily. 60 tablet 2  . atenolol (TENORMIN) 25 MG tablet Take one tablet by mouth daily on NON HEMODIALYSIS days only (Patient taking differently: Take 25 mg by mouth See admin instructions. Take one tablet by mouth daily on NON HEMODIALYSIS days only; Tues Thurs Sat and Sun) 90 tablet 3  . atorvastatin (LIPITOR) 40 MG tablet Take 40 mg by mouth every evening.   3  . calcium acetate (PHOSLO) 667 MG capsule Take 3 capsules (2,001 mg total) by mouth 3 (three) times daily with meals. 90 capsule 0  . Cholecalciferol (VITAMIN D-3) 1000 units CAPS Take 1,000 Units by mouth daily.    . Cinacalcet HCl (SENSIPAR PO) Take by mouth every Monday, Wednesday, and Friday. Given at Dialysis    . escitalopram (LEXAPRO) 10 MG tablet Take 10 mg by mouth daily.    . ferrous sulfate 325 (65 FE) MG EC tablet Take 325 mg by mouth 2 (two) times daily with a meal.     . gabapentin (NEURONTIN) 100 MG capsule Take 100 mg by mouth at bedtime.  10  . hydrALAZINE (APRESOLINE) 50 MG tablet Take 1 tablet (50 mg total) by mouth 2 (two) times daily. 60 tablet 0  . HYDROcodone-acetaminophen (NORCO/VICODIN) 5-325 MG tablet Take 1 tablet by mouth every 6 (six) hours as needed.     . isosorbide mononitrate (IMDUR) 30 MG 24 hr tablet Take 1 tablet (30 mg total) by mouth daily. (Patient taking  differently: Take 30 mg by mouth 2 (two) times daily. Taking on Tues, Thurs, Sat and Sunday) 60 tablet 0  . lidocaine-prilocaine (EMLA) cream Apply 1 application topically as needed (for port access).   11  . loperamide (IMODIUM) 2 MG capsule Take 1 capsule (2 mg total) by mouth every 6 (six) hours as needed for diarrhea or loose stools. 20 capsule 0  . multivitamin (RENA-VIT) TABS tablet Take 1 tablet by mouth 2 (two) times daily.     . nitroGLYCERIN (NITROSTAT) 0.4 MG SL tablet PLEASE SEE ATTACHED FOR DETAILED DIRECTIONS (Patient taking differently: Place 0.4 mg under the tongue every 5 (five) minutes as needed for chest pain. ) 25 tablet 1  . pantoprazole (PROTONIX) 40 MG tablet Take 40 mg  by mouth daily.    . ticagrelor (BRILINTA) 90 MG TABS tablet Take 1 tablet (90 mg total) by mouth 2 (two) times daily. 60 tablet 3  . VELPHORO 500 MG chewable tablet Chew 500 mg by mouth 3 (three) times daily with meals.   3   No current facility-administered medications for this visit.     Patient Active Problem List   Diagnosis Date Noted  . Impingement syndrome of right shoulder 11/08/2018  . Hypertensive emergency 10/16/2018  . Hypertensive encephalopathy 10/15/2018  . Left bundle branch block (LBBB) determined by electrocardiography 02/13/2018  . Aortic stenosis 02/13/2018  . NSTEMI (non-ST elevated myocardial infarction) ([place]) 02/13/2018  . Constipation 12/08/2017  . Abdominal pain 12/06/2017  . Leukocytosis 12/06/2017  . Gout 12/06/2017  . Chronic pain 12/06/2017  . Acute on chronic cholecystitis s/p lap cholecystectomy 12/02/2017 12/02/2017  . Cardiac pacemaker in situ (Medtronic DDD) 12/02/2017  . Acute hypoxemic respiratory failure ([place]) 04/18/2017  . Heart block 04/17/2017  . Elevated troponin 04/17/2017  . ESRD on hemodialysis ([place]) 04/12/2016  . H/O: stroke 04/11/2016  . Saccular aneurysm of infrarenal aorta with thrombosed dissection of abdominal aorta 04/11/2016  . Obesity (BMI  30-39.9) 04/11/2016  . Syncope 04/11/2016  . Prostate CA ([place]) 12/28/2014  . Chest pain 12/28/2014  . C. difficile colitis 10/04/2014  . Hyperkalemia 03/11/2013  . Anemia 03/11/2013  . DM (diabetes mellitus), type 2 with renal complications, diet controlled 03/11/2013  . HTN (hypertension) 03/11/2013  . Hyperlipidemia 03/11/2013  . ESRD on dialysis [facility]) 03/20/2011   Past Medical History:  Diagnosis Date  . Anemia   . Arthritis    "knees" (04/11/2016)  . Blind left eye    S/P trauma  . Chronic total occlusion of artery of the extremities (HCC)    pt not aware of this  . Claudication ([place])   . ESRD on dialysis [facility])    "MWF; [name]" ((04/10/2016)  . ESRD on peritoneal dialysis [facility])    Started dialysis around April 2015 per son.  Has been doing peritoneal dialysis at home.     . Gout   . Hyperlipidemia   . Hypertension   . Overweight(278.02)   . Presence of permanent cardiac pacemaker    medtronic  . Prostate cancer ([place]) 1990s  . Shingles   . Stroke [facility]) ~1998   denies residual on 04/11/2016  . Type II diabetes mellitus (HCC)    diet controlled  . Umbilical hernia 7/65/4650    Family History  Problem Relation Age of Onset  . Cancer Mother   . Heart disease Mother   . Cancer Father     Past Surgical History:  Procedure Laterality Date  . A/V FISTULAGRAM N/A 11/01/2018   Procedure: A/V FISTULAGRAM;  Surgeon: Serafina Mitchell, MD;  Location: [place] [facility];  Service: Cardiovascular;  Laterality: N/A;  . AV FISTULA PLACEMENT, RADIOCEPHALIC  35/46/5681   Left arm  . CAPD INSERTION N/A 03/06/2013   Procedure: LAPAROSCOPIC INSERTION CONTINUOUS AMBULATORY PERITONEAL DIALYSIS  (CAPD) CATHETER;  Surgeon: Edward Jolly, MD;  Location: [place];  Service: General;  Laterality: N/A;  . CATARACT EXTRACTION Right   . CHOLECYSTECTOMY  12/02/2017   LAPROSCOPIC  . CORONARY STENT INTERVENTION N/A 02/11/2018   Procedure: CORONARY STENT INTERVENTION;  Surgeon: Sherren Mocha, MD;  Location: [place] [facility];  Service: Cardiovascular;  Laterality: N/A;  . EYE SURGERY Left 1970s   for eye injury  . HERNIA REPAIR    .  INSERTION PROSTATE RADIATION SEED    . LAPAROSCOPIC CHOLECYSTECTOMY SINGLE SITE WITH INTRAOPERATIVE CHOLANGIOGRAM N/A 12/02/2017   Procedure: LAPAROSCOPIC CHOLECYSTECTOMY;  Surgeon: Michael Boston, MD;  Location: [place];  Service: General;  Laterality: N/A;  . LEFT HEART CATH AND CORONARY ANGIOGRAPHY N/A 02/11/2018   Procedure: LEFT HEART CATH AND CORONARY ANGIOGRAPHY;  Surgeon: Sherren Mocha, MD;  Location: [place] [facility];  Service: Cardiovascular;  Laterality: N/A;  . PACEMAKER IMPLANT N/A 04/20/2017   Procedure: PACEMAKER IMPLANT;  Surgeon: Evans Lance, MD;  Location: [place] [facility];  Service: Cardiovascular;  Laterality: N/A;  . PERIPHERAL VASCULAR BALLOON ANGIOPLASTY  11/01/2018   Procedure: PERIPHERAL VASCULAR BALLOON ANGIOPLASTY;  Surgeon: Serafina Mitchell, MD;  Location: [place] [facility];  Service: Cardiovascular;;  Lt lower arm fistula   . UMBILICAL HERNIA REPAIR N/A 03/06/2013   Procedure: HERNIA REPAIR UMBILICAL WITH MESH;  Surgeon: Edward Jolly, MD;  Location: [facility];  Service: General;  Laterality: N/A;   Social History   Occupational History  . Occupation: Retired  Tobacco Use  . Smoking status: Never Smoker  . Smokeless tobacco: Never Used  Substance and Sexual Activity  . Alcohol use: No  . Drug use: No  . Sexual activity: Not Currently

## 2018-11-09 ENCOUNTER — Ambulatory Visit (INDEPENDENT_AMBULATORY_CARE_PROVIDER_SITE_OTHER): Payer: Medicare Other | Admitting: *Deleted

## 2018-11-09 DIAGNOSIS — I459 Conduction disorder, unspecified: Secondary | ICD-10-CM

## 2018-11-09 DIAGNOSIS — R55 Syncope and collapse: Secondary | ICD-10-CM

## 2018-11-09 LAB — CUP PACEART REMOTE DEVICE CHECK
Battery Remaining Longevity: 124 mo
Battery Voltage: 2.93 V
Brady Statistic AP VP Percent: 4.74 %
Brady Statistic AP VS Percent: 0.17 %
Brady Statistic AS VP Percent: 91.58 %
Brady Statistic AS VS Percent: 3.51 %
Brady Statistic RA Percent Paced: 4.97 %
Brady Statistic RV Percent Paced: 96.32 %
Date Time Interrogation Session: 20200909053239
Implantable Lead Implant Date: 20190219
Implantable Lead Implant Date: 20190219
Implantable Lead Location: 753859
Implantable Lead Location: 753860
Implantable Lead Model: 3830
Implantable Lead Model: 5076
Implantable Pulse Generator Implant Date: 20190219
Lead Channel Impedance Value: 304 Ohm
Lead Channel Impedance Value: 323 Ohm
Lead Channel Impedance Value: 418 Ohm
Lead Channel Impedance Value: 437 Ohm
Lead Channel Pacing Threshold Amplitude: 0.625 V
Lead Channel Pacing Threshold Amplitude: 1.875 V
Lead Channel Pacing Threshold Pulse Width: 0.4 ms
Lead Channel Pacing Threshold Pulse Width: 0.4 ms
Lead Channel Sensing Intrinsic Amplitude: 4.5 mV
Lead Channel Sensing Intrinsic Amplitude: 4.5 mV
Lead Channel Sensing Intrinsic Amplitude: 5.125 mV
Lead Channel Sensing Intrinsic Amplitude: 5.125 mV
Lead Channel Setting Pacing Amplitude: 2 V
Lead Channel Setting Pacing Amplitude: 2.5 V
Lead Channel Setting Pacing Pulse Width: 1 ms
Lead Channel Setting Sensing Sensitivity: 1.2 mV

## 2018-11-22 ENCOUNTER — Ambulatory Visit (INDEPENDENT_AMBULATORY_CARE_PROVIDER_SITE_OTHER): Payer: Medicare Other | Admitting: Orthopaedic Surgery

## 2018-11-22 ENCOUNTER — Other Ambulatory Visit: Payer: Self-pay

## 2018-11-22 ENCOUNTER — Encounter: Payer: Self-pay | Admitting: Orthopaedic Surgery

## 2018-11-22 VITALS — BP 203/100 | HR 72 | Ht 65.0 in | Wt 191.0 lb

## 2018-11-22 DIAGNOSIS — M75111 Incomplete rotator cuff tear or rupture of right shoulder, not specified as traumatic: Secondary | ICD-10-CM

## 2018-11-22 DIAGNOSIS — M25512 Pain in left shoulder: Secondary | ICD-10-CM

## 2018-11-22 DIAGNOSIS — M75112 Incomplete rotator cuff tear or rupture of left shoulder, not specified as traumatic: Secondary | ICD-10-CM

## 2018-11-22 DIAGNOSIS — M7541 Impingement syndrome of right shoulder: Secondary | ICD-10-CM | POA: Diagnosis not present

## 2018-11-22 DIAGNOSIS — Z950 Presence of cardiac pacemaker: Secondary | ICD-10-CM

## 2018-11-22 DIAGNOSIS — M25511 Pain in right shoulder: Secondary | ICD-10-CM

## 2018-11-22 DIAGNOSIS — G8929 Other chronic pain: Secondary | ICD-10-CM

## 2018-11-22 NOTE — Progress Notes (Signed)
Office Visit Note   Patient: Sean Best           Date of Birth: 05/25/1933           MRN: [id number] Visit Date: 11/22/2018              Requested by: Seward Carol, MD [address] [place],  [place] [address] PCP: Seward Carol, MD   Assessment & Plan: Visit Diagnoses:  1. Chronic right shoulder pain   2. Impingement syndrome of right shoulder   3. Pacemaker   4. Nontraumatic partial bilateral rotator cuff tear   5. Chronic pain of both shoulders     Plan: Bilateral shoulder pain.  I think that there is a significant rotator cuff tear on the right based on his weakness.  Has many comorbidities that will preclude surgery including dialysis and a pacemaker.  Will try a course of physical therapy.  Long discussion with Mr. Smeltzer and his daughter who accompanies him over 30 minutes  Follow-Up Instructions: Return in about 6 weeks (around 01/03/2019).   Orders:  Orders Placed This Encounter  Procedures  . Ambulatory referral to Home Health   No orders of the defined types were placed in this encounter.     Procedures: No procedures performed   Clinical Data: No additional findings.   Subjective: Chief Complaint  Patient presents with  . Left Shoulder - Follow-up  . Right Shoulder - Follow-up  Patient presents today for follow up on his shoulders. He received a right shoulder cortisone injection two weeks ago, on 11/08/2018. His right shoulder has continued to hurt, with no relief. He is not taking anything for pain.  HPI  Review of Systems   Objective: Vital Signs: BP (!) 203/100   Pulse 72   Ht 5\' 5"  (1.651 m)   Wt 191 lb (86.6 kg)   BMI 31.78 kg/m   Physical Exam Constitutional:      Appearance: He is well-developed.  Eyes:     Pupils: Pupils are equal, round, and reactive to light.  Pulmonary:     Effort: Pulmonary effort is normal.  Skin:    General: Skin is warm and dry.  Neurological:     Mental Status: He is alert and  oriented to person, place, and time.  Psychiatric:        Behavior: Behavior normal.     Ortho Exam awake alert and oriented x3 comfortable sitting.  Has dialysis access shunt left forearm.  Pacemaker in right chest wall.  Has considerable weakness with overhead motion with subacromial pain right shoulder..  No crepitation.  No pain at the chromic clavicular joint.  I think the biceps is intact.  Good grip and release. Left shoulder with positive impingement.  Some weakness but not to the extent that he has on the right.  Skin intact Specialty Comments:  No specialty comments available.  Imaging: No results found.   PMFS History: Patient Active Problem List   Diagnosis Date Noted  . Shoulder pain, bilateral 11/22/2018  . Impingement syndrome of right shoulder 11/08/2018  . Hypertensive emergency 10/16/2018  . Hypertensive encephalopathy 10/15/2018  . Left bundle branch block (LBBB) determined by electrocardiography 02/13/2018  . Aortic stenosis 02/13/2018  . NSTEMI (non-ST elevated myocardial infarction) ([place]) 02/13/2018  . Constipation 12/08/2017  . Abdominal pain 12/06/2017  . Leukocytosis 12/06/2017  . Gout 12/06/2017  . Chronic pain 12/06/2017  . Acute on chronic cholecystitis s/p lap cholecystectomy 12/02/2017 12/02/2017  .  Cardiac pacemaker in situ (Medtronic DDD) 12/02/2017  . Acute hypoxemic respiratory failure ([place]) 04/18/2017  . Heart block 04/17/2017  . Elevated troponin 04/17/2017  . ESRD on hemodialysis ([place]) 04/12/2016  . H/O: stroke 04/11/2016  . Saccular aneurysm of infrarenal aorta with thrombosed dissection of abdominal aorta 04/11/2016  . Obesity (BMI 30-39.9) 04/11/2016  . Syncope 04/11/2016  . Prostate CA ([place]) 12/28/2014  . Chest pain 12/28/2014  . C. difficile colitis 10/04/2014  . Hyperkalemia 03/11/2013  . Anemia 03/11/2013  . DM (diabetes mellitus), type 2 with renal complications, diet controlled 03/11/2013  . HTN (hypertension) 03/11/2013  .  Hyperlipidemia 03/11/2013  . ESRD on dialysis [facility]) 03/20/2011   Past Medical History:  Diagnosis Date  . Anemia   . Arthritis    "knees" (04/11/2016)  . Blind left eye    S/P trauma  . Chronic total occlusion of artery of the extremities (HCC)    pt not aware of this  . Claudication ([place])   . ESRD on dialysis [facility])    "MWF; [name]" ((04/10/2016)  . ESRD on peritoneal dialysis [facility])    Started dialysis around April 2015 per son.  Has been doing peritoneal dialysis at home.     . Gout   . Hyperlipidemia   . Hypertension   . Overweight(278.02)   . Presence of permanent cardiac pacemaker    medtronic  . Prostate cancer ([place]) 1990s  . Shingles   . Stroke [facility]) ~1998   denies residual on 04/11/2016  . Type II diabetes mellitus (HCC)    diet controlled  . Umbilical hernia 6/65/9935    Family History  Problem Relation Age of Onset  . Cancer Mother   . Heart disease Mother   . Cancer Father     Past Surgical History:  Procedure Laterality Date  . A/V FISTULAGRAM N/A 11/01/2018   Procedure: A/V FISTULAGRAM;  Surgeon: Serafina Mitchell, MD;  Location: [place] CV LAB;  Service: Cardiovascular;  Laterality: N/A;  . AV FISTULA PLACEMENT, RADIOCEPHALIC  70/17/7939   Left arm  . CAPD INSERTION N/A 03/06/2013   Procedure: LAPAROSCOPIC INSERTION CONTINUOUS AMBULATORY PERITONEAL DIALYSIS  (CAPD) CATHETER;  Surgeon: Edward Jolly, MD;  Location: [place];  Service: General;  Laterality: N/A;  . CATARACT EXTRACTION Right   . CHOLECYSTECTOMY  12/02/2017   LAPROSCOPIC  . CORONARY STENT INTERVENTION N/A 02/11/2018   Procedure: CORONARY STENT INTERVENTION;  Surgeon: Sherren Mocha, MD;  Location: [place] CV LAB;  Service: Cardiovascular;  Laterality: N/A;  . EYE SURGERY Left 1970s   for eye injury  . HERNIA REPAIR    . INSERTION PROSTATE RADIATION SEED    . LAPAROSCOPIC CHOLECYSTECTOMY SINGLE SITE WITH INTRAOPERATIVE CHOLANGIOGRAM N/A 12/02/2017   Procedure: LAPAROSCOPIC  CHOLECYSTECTOMY;  Surgeon: Michael Boston, MD;  Location: [place];  Service: General;  Laterality: N/A;  . LEFT HEART CATH AND CORONARY ANGIOGRAPHY N/A 02/11/2018   Procedure: LEFT HEART CATH AND CORONARY ANGIOGRAPHY;  Surgeon: Sherren Mocha, MD;  Location: [place] CV LAB;  Service: Cardiovascular;  Laterality: N/A;  . PACEMAKER IMPLANT N/A 04/20/2017   Procedure: PACEMAKER IMPLANT;  Surgeon: Evans Lance, MD;  Location: [place] CV LAB;  Service: Cardiovascular;  Laterality: N/A;  . PERIPHERAL VASCULAR BALLOON ANGIOPLASTY  11/01/2018   Procedure: PERIPHERAL VASCULAR BALLOON ANGIOPLASTY;  Surgeon: Serafina Mitchell, MD;  Location: [place] CV LAB;  Service: Cardiovascular;;  Lt lower arm fistula   . UMBILICAL HERNIA REPAIR N/A 03/06/2013   Procedure:  HERNIA REPAIR UMBILICAL WITH MESH;  Surgeon: Edward Jolly, MD;  Location: [facility] OR;  Service: General;  Laterality: N/A;   Social History   Occupational History  . Occupation: Retired  Tobacco Use  . Smoking status: Never Smoker  . Smokeless tobacco: Never Used  Substance and Sexual Activity  . Alcohol use: No  . Drug use: No  . Sexual activity: Not Currently

## 2018-11-24 NOTE — Progress Notes (Signed)
Remote pacemaker transmission.   

## 2019-02-08 ENCOUNTER — Ambulatory Visit (INDEPENDENT_AMBULATORY_CARE_PROVIDER_SITE_OTHER): Payer: Medicare Other | Admitting: *Deleted

## 2019-02-08 DIAGNOSIS — Z950 Presence of cardiac pacemaker: Secondary | ICD-10-CM

## 2019-02-08 LAB — CUP PACEART REMOTE DEVICE CHECK
Battery Remaining Longevity: 119 mo
Battery Voltage: 2.95 V
Brady Statistic AP VP Percent: 2.07 %
Brady Statistic AP VS Percent: 1.24 %
Brady Statistic AS VP Percent: 39.54 %
Brady Statistic AS VS Percent: 57.15 %
Brady Statistic RA Percent Paced: 3.56 %
Brady Statistic RV Percent Paced: 41.61 %
Date Time Interrogation Session: 20201209003618
Implantable Lead Implant Date: 20190219
Implantable Lead Implant Date: 20190219
Implantable Lead Location: 753859
Implantable Lead Location: 753860
Implantable Lead Model: 3830
Implantable Lead Model: 5076
Implantable Pulse Generator Implant Date: 20190219
Lead Channel Impedance Value: 323 Ohm
Lead Channel Impedance Value: 323 Ohm
Lead Channel Impedance Value: 418 Ohm
Lead Channel Impedance Value: 437 Ohm
Lead Channel Pacing Threshold Amplitude: 0.75 V
Lead Channel Pacing Threshold Amplitude: 1.75 V
Lead Channel Pacing Threshold Pulse Width: 0.4 ms
Lead Channel Pacing Threshold Pulse Width: 0.4 ms
Lead Channel Sensing Intrinsic Amplitude: 6 mV
Lead Channel Sensing Intrinsic Amplitude: 6 mV
Lead Channel Sensing Intrinsic Amplitude: 6.5 mV
Lead Channel Sensing Intrinsic Amplitude: 6.5 mV
Lead Channel Setting Pacing Amplitude: 2 V
Lead Channel Setting Pacing Amplitude: 2.5 V
Lead Channel Setting Pacing Pulse Width: 1 ms
Lead Channel Setting Sensing Sensitivity: 1.2 mV

## 2019-03-04 DIAGNOSIS — N2581 Secondary hyperparathyroidism of renal origin: Secondary | ICD-10-CM | POA: Diagnosis not present

## 2019-03-04 DIAGNOSIS — N186 End stage renal disease: Secondary | ICD-10-CM | POA: Diagnosis not present

## 2019-03-04 DIAGNOSIS — Z992 Dependence on renal dialysis: Secondary | ICD-10-CM | POA: Diagnosis not present

## 2019-03-06 DIAGNOSIS — N186 End stage renal disease: Secondary | ICD-10-CM | POA: Diagnosis not present

## 2019-03-06 DIAGNOSIS — N2581 Secondary hyperparathyroidism of renal origin: Secondary | ICD-10-CM | POA: Diagnosis not present

## 2019-03-06 DIAGNOSIS — Z992 Dependence on renal dialysis: Secondary | ICD-10-CM | POA: Diagnosis not present

## 2019-03-08 DIAGNOSIS — Z992 Dependence on renal dialysis: Secondary | ICD-10-CM | POA: Diagnosis not present

## 2019-03-08 DIAGNOSIS — N2581 Secondary hyperparathyroidism of renal origin: Secondary | ICD-10-CM | POA: Diagnosis not present

## 2019-03-08 DIAGNOSIS — N186 End stage renal disease: Secondary | ICD-10-CM | POA: Diagnosis not present

## 2019-03-10 DIAGNOSIS — N2581 Secondary hyperparathyroidism of renal origin: Secondary | ICD-10-CM | POA: Diagnosis not present

## 2019-03-10 DIAGNOSIS — Z992 Dependence on renal dialysis: Secondary | ICD-10-CM | POA: Diagnosis not present

## 2019-03-10 DIAGNOSIS — N186 End stage renal disease: Secondary | ICD-10-CM | POA: Diagnosis not present

## 2019-03-13 DIAGNOSIS — Z992 Dependence on renal dialysis: Secondary | ICD-10-CM | POA: Diagnosis not present

## 2019-03-13 DIAGNOSIS — N186 End stage renal disease: Secondary | ICD-10-CM | POA: Diagnosis not present

## 2019-03-13 DIAGNOSIS — N2581 Secondary hyperparathyroidism of renal origin: Secondary | ICD-10-CM | POA: Diagnosis not present

## 2019-03-15 DIAGNOSIS — N2581 Secondary hyperparathyroidism of renal origin: Secondary | ICD-10-CM | POA: Diagnosis not present

## 2019-03-15 DIAGNOSIS — N186 End stage renal disease: Secondary | ICD-10-CM | POA: Diagnosis not present

## 2019-03-15 DIAGNOSIS — Z992 Dependence on renal dialysis: Secondary | ICD-10-CM | POA: Diagnosis not present

## 2019-03-17 DIAGNOSIS — Z992 Dependence on renal dialysis: Secondary | ICD-10-CM | POA: Diagnosis not present

## 2019-03-17 DIAGNOSIS — N186 End stage renal disease: Secondary | ICD-10-CM | POA: Diagnosis not present

## 2019-03-17 DIAGNOSIS — N2581 Secondary hyperparathyroidism of renal origin: Secondary | ICD-10-CM | POA: Diagnosis not present

## 2019-03-18 NOTE — Progress Notes (Signed)
PPM remote 

## 2019-03-22 DIAGNOSIS — N2581 Secondary hyperparathyroidism of renal origin: Secondary | ICD-10-CM | POA: Diagnosis not present

## 2019-03-22 DIAGNOSIS — Z992 Dependence on renal dialysis: Secondary | ICD-10-CM | POA: Diagnosis not present

## 2019-03-22 DIAGNOSIS — N186 End stage renal disease: Secondary | ICD-10-CM | POA: Diagnosis not present

## 2019-03-24 ENCOUNTER — Ambulatory Visit: Payer: Medicare HMO | Attending: Internal Medicine

## 2019-03-24 DIAGNOSIS — Z23 Encounter for immunization: Secondary | ICD-10-CM | POA: Insufficient documentation

## 2019-03-24 DIAGNOSIS — N2581 Secondary hyperparathyroidism of renal origin: Secondary | ICD-10-CM | POA: Diagnosis not present

## 2019-03-24 DIAGNOSIS — N186 End stage renal disease: Secondary | ICD-10-CM | POA: Diagnosis not present

## 2019-03-24 DIAGNOSIS — Z992 Dependence on renal dialysis: Secondary | ICD-10-CM | POA: Diagnosis not present

## 2019-03-24 NOTE — Progress Notes (Signed)
   Covid-19 Vaccination Clinic  Name:  Sean Best    MRN: [id number] DOB: 12/02/1933  03/24/2019  Mr. Dome was observed post Covid-19 immunization for 15 minutes without incidence. He was provided with Vaccine Information Sheet and instruction to access the V-Safe system.   [name] was instructed to call 911 with any severe reactions post vaccine: Marland Kitchen Difficulty breathing  . Swelling of your face and throat  . A fast heartbeat  . A bad rash all over your body  . Dizziness and weakness    Immunizations Administered    Name Date Dose VIS Date Route   Pfizer COVID-19 Vaccine 03/24/2019  6:20 PM 0.3 mL 02/10/2019 Intramuscular   Manufacturer: [place]   Lot: [id number]   [place]: [address]-7

## 2019-03-29 ENCOUNTER — Encounter (HOSPITAL_COMMUNITY): Payer: Self-pay | Admitting: Pharmacy Technician

## 2019-03-29 ENCOUNTER — Emergency Department (HOSPITAL_COMMUNITY): Payer: Medicare HMO

## 2019-03-29 ENCOUNTER — Other Ambulatory Visit: Payer: Self-pay

## 2019-03-29 ENCOUNTER — Non-Acute Institutional Stay (HOSPITAL_COMMUNITY)
Admission: EM | Admit: 2019-03-29 | Discharge: 2019-03-29 | Disposition: A | Discharge status: Home / Self Care | Payer: Medicare HMO | Attending: Nephrology | Admitting: Nephrology

## 2019-03-29 DIAGNOSIS — R531 Weakness: Secondary | ICD-10-CM | POA: Diagnosis not present

## 2019-03-29 DIAGNOSIS — Z8673 Personal history of transient ischemic attack (TIA), and cerebral infarction without residual deficits: Secondary | ICD-10-CM | POA: Diagnosis not present

## 2019-03-29 DIAGNOSIS — Z992 Dependence on renal dialysis: Secondary | ICD-10-CM | POA: Insufficient documentation

## 2019-03-29 DIAGNOSIS — Z7982 Long term (current) use of aspirin: Secondary | ICD-10-CM | POA: Insufficient documentation

## 2019-03-29 DIAGNOSIS — Z950 Presence of cardiac pacemaker: Secondary | ICD-10-CM | POA: Diagnosis not present

## 2019-03-29 DIAGNOSIS — E785 Hyperlipidemia, unspecified: Secondary | ICD-10-CM | POA: Diagnosis not present

## 2019-03-29 DIAGNOSIS — I10 Essential (primary) hypertension: Secondary | ICD-10-CM | POA: Diagnosis not present

## 2019-03-29 DIAGNOSIS — D631 Anemia in chronic kidney disease: Secondary | ICD-10-CM | POA: Insufficient documentation

## 2019-03-29 DIAGNOSIS — R0602 Shortness of breath: Secondary | ICD-10-CM

## 2019-03-29 DIAGNOSIS — Z8546 Personal history of malignant neoplasm of prostate: Secondary | ICD-10-CM | POA: Insufficient documentation

## 2019-03-29 DIAGNOSIS — E1165 Type 2 diabetes mellitus with hyperglycemia: Secondary | ICD-10-CM | POA: Insufficient documentation

## 2019-03-29 DIAGNOSIS — Z79899 Other long term (current) drug therapy: Secondary | ICD-10-CM | POA: Insufficient documentation

## 2019-03-29 DIAGNOSIS — E1122 Type 2 diabetes mellitus with diabetic chronic kidney disease: Secondary | ICD-10-CM | POA: Insufficient documentation

## 2019-03-29 DIAGNOSIS — E875 Hyperkalemia: Secondary | ICD-10-CM | POA: Insufficient documentation

## 2019-03-29 DIAGNOSIS — M109 Gout, unspecified: Secondary | ICD-10-CM | POA: Diagnosis not present

## 2019-03-29 DIAGNOSIS — Z955 Presence of coronary angioplasty implant and graft: Secondary | ICD-10-CM | POA: Insufficient documentation

## 2019-03-29 DIAGNOSIS — N186 End stage renal disease: Secondary | ICD-10-CM | POA: Insufficient documentation

## 2019-03-29 DIAGNOSIS — H5462 Unqualified visual loss, left eye, normal vision right eye: Secondary | ICD-10-CM | POA: Insufficient documentation

## 2019-03-29 DIAGNOSIS — Z743 Need for continuous supervision: Secondary | ICD-10-CM | POA: Diagnosis not present

## 2019-03-29 DIAGNOSIS — R079 Chest pain, unspecified: Secondary | ICD-10-CM | POA: Diagnosis not present

## 2019-03-29 DIAGNOSIS — I252 Old myocardial infarction: Secondary | ICD-10-CM | POA: Insufficient documentation

## 2019-03-29 DIAGNOSIS — Z20822 Contact with and (suspected) exposure to covid-19: Secondary | ICD-10-CM | POA: Diagnosis not present

## 2019-03-29 DIAGNOSIS — I120 Hypertensive chronic kidney disease with stage 5 chronic kidney disease or end stage renal disease: Secondary | ICD-10-CM | POA: Insufficient documentation

## 2019-03-29 DIAGNOSIS — E669 Obesity, unspecified: Secondary | ICD-10-CM | POA: Diagnosis not present

## 2019-03-29 LAB — COMPREHENSIVE METABOLIC PANEL
ALT: 12 U/L (ref 0–44)
AST: 10 U/L — ABNORMAL LOW (ref 15–41)
Albumin: 3.4 g/dL — ABNORMAL LOW (ref 3.5–5.0)
Alkaline Phosphatase: 53 U/L (ref 38–126)
Anion gap: 17 — ABNORMAL HIGH (ref 5–15)
BUN: 65 mg/dL — ABNORMAL HIGH (ref 8–23)
CO2: 23 mmol/L (ref 22–32)
Calcium: 9 mg/dL (ref 8.9–10.3)
Chloride: 97 mmol/L — ABNORMAL LOW (ref 98–111)
Creatinine, Ser: 12.09 mg/dL — ABNORMAL HIGH (ref 0.61–1.24)
GFR calc Af Amer: 4 mL/min — ABNORMAL LOW (ref >60)
GFR calc non Af Amer: 3 mL/min — ABNORMAL LOW (ref >60)
Glucose, Bld: 90 mg/dL (ref 70–99)
Potassium: 6.8 mmol/L (ref 3.5–5.1)
Sodium: 137 mmol/L (ref 135–145)
Total Bilirubin: 0.9 mg/dL (ref 0.3–1.2)
Total Protein: 6.6 g/dL (ref 6.5–8.1)

## 2019-03-29 LAB — CBC WITH DIFFERENTIAL/PLATELET
Abs Immature Granulocytes: 0.07 10*3/uL (ref 0.00–0.07)
Basophils Absolute: 0 10*3/uL (ref 0.0–0.1)
Basophils Relative: 0 %
Eosinophils Absolute: 0.2 10*3/uL (ref 0.0–0.5)
Eosinophils Relative: 2 %
HCT: 31.2 % — ABNORMAL LOW (ref 39.0–52.0)
Hemoglobin: 9.5 g/dL — ABNORMAL LOW (ref 13.0–17.0)
Immature Granulocytes: 1 %
Lymphocytes Relative: 9 %
Lymphs Abs: 0.8 10*3/uL (ref 0.7–4.0)
MCH: 27.5 pg (ref 26.0–34.0)
MCHC: 30.4 g/dL (ref 30.0–36.0)
MCV: 90.4 fL (ref 80.0–100.0)
Monocytes Absolute: 0.7 10*3/uL (ref 0.1–1.0)
Monocytes Relative: 8 %
Neutro Abs: 7.5 10*3/uL (ref 1.7–7.7)
Neutrophils Relative %: 80 %
Platelets: 295 10*3/uL (ref 150–400)
RBC: 3.45 MIL/uL — ABNORMAL LOW (ref 4.22–5.81)
RDW: 18.7 % — ABNORMAL HIGH (ref 11.5–15.5)
WBC: 9.3 10*3/uL (ref 4.0–10.5)
nRBC: 0 % (ref 0.0–0.2)

## 2019-03-29 LAB — RESPIRATORY PANEL BY RT PCR (FLU A&B, COVID)
Influenza A by PCR: NEGATIVE
Influenza B by PCR: NEGATIVE
SARS Coronavirus 2 by RT PCR: NEGATIVE

## 2019-03-29 LAB — POC SARS CORONAVIRUS 2 AG -  ED: SARS Coronavirus 2 Ag: NEGATIVE

## 2019-03-29 LAB — HEPATITIS B SURFACE ANTIGEN: Hepatitis B Surface Ag: NONREACTIVE

## 2019-03-29 MED ORDER — CHLORHEXIDINE GLUCONATE CLOTH 2 % EX PADS: 6.0000 | MEDICATED_PAD | Freq: Every day | CUTANEOUS | Status: DC

## 2019-03-29 NOTE — Discharge Instructions (Signed)
Follow-up with your dialysis center as scheduled.

## 2019-03-29 NOTE — Procedures (Signed)
Asked to see patient for hyperkalemia and for dialysis.  Seen in ED and did not require admission.  Has been having post- covid vaccine symptoms for several days.  Some SOB. 95% on RA.   CXR showed poss early IS edema. Asked to see for ESRD.  Plan is for HD here upstairs (pt is on now) and will dc home post HD if remains stable.  UF goal 3L.   I was present at this dialysis session, have reviewed the session itself and made  appropriate changes Kelly Splinter MD [place] pager [phone]   03/29/2019, 4:57 PM

## 2019-03-29 NOTE — ED Triage Notes (Signed)
Pt bib ptar from home for [place], room air 98% with lungs clear bilaterally. Pt received his first covid vaccine on Friday and states he has not felt himself since. Pt MWF dialysis pt. Last full tx was Friday.  98.67F CBG 107 190/100 HR 88 RR 18 98% RA

## 2019-03-29 NOTE — ED Notes (Signed)
Took patient to the bathroom patient did well now patient is back in bed

## 2019-03-29 NOTE — ED Provider Notes (Signed)
[place] EMERGENCY DEPARTMENT Provider Note   CSN: [id number] Arrival date & time: 03/29/19  0947     History Chief Complaint  Patient presents with  . Covid vaccine/ weakness    Sean Best is a 86 y.o. male.  HPI Patient presents with fatigue and some shortness of breath.  States began somewhat on Friday after he had his first Covid shot.  States he has been weak since.  He is a Monday Wednesday Friday dialysis patient and went to dialysis on Friday, however has not gone Monday or today.  Feels more short of breath.  Occasional cough with no sputum production.  No fevers.  Does not feel as if he is caring more fluid.  No known sick contacts.  No contact with anyone with Covid.  No chest pain.  Does not feel lateralizing weakness states he just feels worn out.    Past Medical History:  Diagnosis Date  . Anemia   . Arthritis    "knees" (04/11/2016)  . Blind left eye    S/P trauma  . Chronic total occlusion of artery of the extremities (HCC)    pt not aware of this  . Claudication ([place])   . ESRD on dialysis [facility])    "MWF; [name]" ((04/10/2016)  . ESRD on peritoneal dialysis [facility])    Started dialysis around April 2015 per son.  Has been doing peritoneal dialysis at home.     . Gout   . Hyperlipidemia   . Hypertension   . Overweight(278.02)   . Presence of permanent cardiac pacemaker    medtronic  . Prostate cancer ([place]) 1990s  . Shingles   . Stroke [facility]) ~1998   denies residual on 04/11/2016  . Type II diabetes mellitus (HCC)    diet controlled  . Umbilical hernia 0/96/2836    Patient Active Problem List   Diagnosis Date Noted  . Shoulder pain, bilateral 11/22/2018  . Impingement syndrome of right shoulder 11/08/2018  . Hypertensive emergency 10/16/2018  . Hypertensive encephalopathy 10/15/2018  . Left bundle branch block (LBBB) determined by electrocardiography 02/13/2018  . Aortic stenosis 02/13/2018  . NSTEMI (non-ST elevated  myocardial infarction) ([place]) 02/13/2018  . Constipation 12/08/2017  . Abdominal pain 12/06/2017  . Leukocytosis 12/06/2017  . Gout 12/06/2017  . Chronic pain 12/06/2017  . Acute on chronic cholecystitis s/p lap cholecystectomy 12/02/2017 12/02/2017  . Cardiac pacemaker in situ (Medtronic DDD) 12/02/2017  . Acute hypoxemic respiratory failure ([place]) 04/18/2017  . Heart block 04/17/2017  . Elevated troponin 04/17/2017  . ESRD on hemodialysis ([place]) 04/12/2016  . H/O: stroke 04/11/2016  . Saccular aneurysm of infrarenal aorta with thrombosed dissection of abdominal aorta 04/11/2016  . Obesity (BMI 30-39.9) 04/11/2016  . Syncope 04/11/2016  . Prostate CA ([place]) 12/28/2014  . Chest pain 12/28/2014  . C. difficile colitis 10/04/2014  . Hyperkalemia 03/11/2013  . Anemia 03/11/2013  . DM (diabetes mellitus), type 2 with renal complications, diet controlled 03/11/2013  . HTN (hypertension) 03/11/2013  . Hyperlipidemia 03/11/2013  . ESRD on dialysis [facility]) 03/20/2011    Past Surgical History:  Procedure Laterality Date  . A/V FISTULAGRAM N/A 11/01/2018   Procedure: A/V FISTULAGRAM;  Surgeon: Serafina Mitchell, MD;  Location: [place] CV LAB;  Service: Cardiovascular;  Laterality: N/A;  . AV FISTULA PLACEMENT, RADIOCEPHALIC  62/94/7654   Left arm  . CAPD INSERTION N/A 03/06/2013   Procedure: LAPAROSCOPIC INSERTION CONTINUOUS AMBULATORY PERITONEAL DIALYSIS  (CAPD) CATHETER;  Surgeon: Edward Jolly,  MD;  Location: [place];  Service: General;  Laterality: N/A;  . CATARACT EXTRACTION Right   . CHOLECYSTECTOMY  12/02/2017   LAPROSCOPIC  . CORONARY STENT INTERVENTION N/A 02/11/2018   Procedure: CORONARY STENT INTERVENTION;  Surgeon: Sherren Mocha, MD;  Location: [place] CV LAB;  Service: Cardiovascular;  Laterality: N/A;  . EYE SURGERY Left 1970s   for eye injury  . HERNIA REPAIR    . INSERTION PROSTATE RADIATION SEED    . LAPAROSCOPIC CHOLECYSTECTOMY SINGLE SITE WITH INTRAOPERATIVE  CHOLANGIOGRAM N/A 12/02/2017   Procedure: LAPAROSCOPIC CHOLECYSTECTOMY;  Surgeon: Michael Boston, MD;  Location: [place];  Service: General;  Laterality: N/A;  . LEFT HEART CATH AND CORONARY ANGIOGRAPHY N/A 02/11/2018   Procedure: LEFT HEART CATH AND CORONARY ANGIOGRAPHY;  Surgeon: Sherren Mocha, MD;  Location: [place] CV LAB;  Service: Cardiovascular;  Laterality: N/A;  . PACEMAKER IMPLANT N/A 04/20/2017   Procedure: PACEMAKER IMPLANT;  Surgeon: Evans Lance, MD;  Location: [place] CV LAB;  Service: Cardiovascular;  Laterality: N/A;  . PERIPHERAL VASCULAR BALLOON ANGIOPLASTY  11/01/2018   Procedure: PERIPHERAL VASCULAR BALLOON ANGIOPLASTY;  Surgeon: Serafina Mitchell, MD;  Location: [place] CV LAB;  Service: Cardiovascular;;  Lt lower arm fistula   . UMBILICAL HERNIA REPAIR N/A 03/06/2013   Procedure: HERNIA REPAIR UMBILICAL WITH MESH;  Surgeon: Edward Jolly, MD;  Location: [facility] OR;  Service: General;  Laterality: N/A;       Family History  Problem Relation Age of Onset  . Cancer Mother   . Heart disease Mother   . Cancer Father     Social History   Tobacco Use  . Smoking status: Never Smoker  . Smokeless tobacco: Never Used  Substance Use Topics  . Alcohol use: No  . Drug use: No    Home Medications Prior to Admission medications   Medication Sig Start Date End Date Taking? Authorizing Provider  allopurinol (ZYLOPRIM) 100 MG tablet Take 100 mg by mouth daily.      [provider]  amLODipine (NORVASC) 10 MG tablet Take 10 mg by mouth See admin instructions. Taking only on Tues, Thur, Sat, Sunday 05/15/18   [provider]  aspirin 81 MG chewable tablet Chew 1 tablet (81 mg total) by mouth daily. 02/14/18   Desiree Hane, MD  atenolol (TENORMIN) 25 MG tablet Take one tablet by mouth daily on NON HEMODIALYSIS days only Patient taking differently: Take 25 mg by mouth See admin instructions. Take one tablet by mouth daily on NON HEMODIALYSIS days  only; Tues Thurs Sat and Sun 07/12/18   Evans Lance, MD  atorvastatin (LIPITOR) 40 MG tablet Take 40 mg by mouth every evening.  07/07/17   [provider]  calcium acetate (PHOSLO) 667 MG capsule Take 3 capsules (2,001 mg total) by mouth 3 (three) times daily with meals. 04/30/18   Nita Sells, MD  Cinacalcet HCl (SENSIPAR PO) Take by mouth every Monday, Wednesday, and Friday. Given at Dialysis    [provider]  escitalopram (LEXAPRO) 10 MG tablet Take 10 mg by mouth daily. 04/06/18   [provider]  ferrous sulfate 325 (65 FE) MG EC tablet Take 325 mg by mouth 2 (two) times daily with a meal.     [provider]  gabapentin (NEURONTIN) 100 MG capsule Take 100 mg by mouth at bedtime. 12/27/16   [provider]  hydrALAZINE (APRESOLINE) 50 MG tablet Take 1 tablet (50 mg total) by mouth 2 (two)  times daily. 10/18/18 11/17/18  Arrien, Jimmy Picket, MD  isosorbide mononitrate (IMDUR) 30 MG 24 hr tablet Take 1 tablet (30 mg total) by mouth daily. Patient taking differently: Take 30 mg by mouth 2 (two) times daily. Taking on [place], Sat and Sunday 02/14/18   Oretha Milch D, MD  lidocaine-prilocaine (EMLA) cream Apply 1 application topically as needed (for port access).  10/28/17   [provider]  loperamide (IMODIUM) 2 MG capsule Take 1 capsule (2 mg total) by mouth every 6 (six) hours as needed for diarrhea or loose stools. 10/18/18   Arrien, Jimmy Picket, MD  multivitamin (RENA-VIT) TABS tablet Take 1 tablet by mouth 2 (two) times daily.     [provider]  nitroGLYCERIN (NITROSTAT) 0.4 MG SL tablet PLEASE SEE ATTACHED FOR DETAILED DIRECTIONS Patient taking differently: Place 0.4 mg under the tongue every 5 (five) minutes as needed for chest pain.  06/17/18   Leonie Man, MD  pantoprazole (PROTONIX) 40 MG tablet Take 40 mg by mouth daily.    [provider]  ticagrelor (BRILINTA) 90 MG TABS tablet Take 1  tablet (90 mg total) by mouth 2 (two) times daily. 02/13/18   Desiree Hane, MD  VELPHORO 500 MG chewable tablet Chew 500 mg by mouth 3 (three) times daily with meals.  01/28/18   [provider]    Allergies    Cardura [doxazosin mesylate] and Tape  Review of Systems   Review of Systems  Constitutional: Positive for appetite change and fatigue.  HENT: Negative for congestion.   Respiratory: Positive for cough and shortness of breath.   Cardiovascular: Negative for chest pain.  Gastrointestinal: Negative for abdominal pain.  Genitourinary: Negative for flank pain.  Musculoskeletal: Negative for back pain.  Skin: Negative for rash.  Neurological: Positive for weakness.  Psychiatric/Behavioral: Negative for confusion.    Physical Exam Updated Vital Signs BP (!) 202/108   Pulse 83   Temp 98.2 F (36.8 C) (Oral)   Resp (!) 30   SpO2 95%   Physical Exam Vitals and nursing note reviewed.  Constitutional:      Appearance: Normal appearance.  HENT:     Head: Atraumatic.  Eyes:     General: No scleral icterus. Cardiovascular:     Rate and Rhythm: Regular rhythm.  Pulmonary:     Breath sounds: No wheezing or rhonchi.  Musculoskeletal:     Cervical back: Neck supple.     Right lower leg: Edema present.     Left lower leg: Edema present.     Comments: Pitting edema bilateral lower extremities.  Skin:    Capillary Refill: Capillary refill takes less than 2 seconds.  Neurological:     Mental Status: Mental status is at baseline.     Comments: Patient is awake and appropriate.  Moves all extremities.     ED Results / Procedures / Treatments   Labs (all labs ordered are listed, but only abnormal results are displayed) Labs Reviewed  COMPREHENSIVE METABOLIC PANEL - Abnormal; Notable for the following components:      Result Value   Potassium 6.8 (*)    Chloride 97 (*)    BUN 65 (*)    Creatinine, Ser 12.09 (*)    Albumin 3.4 (*)    AST 10 (*)    GFR calc  non Af Amer 3 (*)    GFR calc Af Amer 4 (*)    Anion gap 17 (*)    All other  components within normal limits  CBC WITH DIFFERENTIAL/PLATELET - Abnormal; Notable for the following components:   RBC 3.45 (*)    Hemoglobin 9.5 (*)    HCT 31.2 (*)    RDW 18.7 (*)    All other components within normal limits  RESPIRATORY PANEL BY RT PCR (FLU A&B, COVID)  HEPATITIS B SURFACE ANTIGEN  POC SARS CORONAVIRUS 2 AG -  ED    EKG EKG Interpretation  Date/Time:  Wednesday March 29 2019 09:53:48 EST Ventricular Rate:  78 PR Interval:  176 QRS Duration: 154 QT Interval:  444 QTC Calculation: 506 R Axis:   -4 Text Interpretation: Normal sinus rhythm Right bundle branch block Abnormal ECG Confirmed by Davonna Belling [phone]) on 03/29/2019 10:03:03 AM   Radiology DG Chest 2 View  Result Date: 03/29/2019 CLINICAL DATA:  Shortness of breath, diarrhea, mid chest pain getting worse every day, had a missed dialysis today to come to emergency room, all symptoms since receiving COVID vaccine last week, history hypertension, prostate cancer, type II B diabetes mellitus, stroke EXAM: CHEST - 2 VIEW COMPARISON:  10/15/2018 FINDINGS: LEFT subclavian transvenous pacer with leads project over RIGHT atrium and RIGHT ventricle. Enlargement of cardiac silhouette with slight pulmonary vascular congestion. Atherosclerotic calcification aorta. Mild chronic peribronchial thickening and mild accentuation of perihilar interstitial markings slightly greater on RIGHT, cannot exclude minimal pulmonary edema. Remaining lungs clear. No pleural effusion or pneumothorax. Bones unremarkable. IMPRESSION: Enlargement of cardiac silhouette with vascular congestion and minimal perihilar interstitial infiltrates question pulmonary edema. Aortic Atherosclerosis (ICD10-I70.0). Electronically Signed   By: Lavonia Dana M.D.   On: 03/29/2019 11:05    Procedures Procedures (including critical care time)  Medications Ordered in ED  Medications  Chlorhexidine Gluconate Cloth 2 % PADS 6 each (has no administration in time range)    ED Course  I have reviewed the triage vital signs and the nursing notes.  Pertinent labs & imaging results that were available during my care of the patient were reviewed by me and considered in my medical decision making (see chart for details).    MDM Rules/Calculators/A&P                      Patient with generalized weakness.  Hyperglycemia and hypertension.  Mild volume overload.  Discussed with nephrology who will dialyze patient.  Generalized weakness also could be from recent Covid shot Final Clinical Impression(s) / ED Diagnoses Final diagnoses:  End stage renal disease on dialysis [facility])  Hyperkalemia    Rx / DC Orders ED Discharge Orders    None       Davonna Belling, MD 03/29/19 1556

## 2019-03-29 NOTE — ED Provider Notes (Signed)
Patient was sent in from dialysis center after having dialysis performed.  Patient was initially sent because he was feeling poorly.  Thought to be may be secondary to having the Covid vaccine however he was found to be fluid overloaded and had a potassium of 6.8.  He feels much better after dialysis.  Will discharge home.   Deno Etienne, DO 03/29/19 1815

## 2019-03-29 NOTE — ED Notes (Signed)
Patient Alert and oriented to baseline. Stable and ambulatory to baseline. Patient verbalized understanding of the discharge instructions.  Patient belongings were taken by the patient.   

## 2019-03-30 DIAGNOSIS — E1122 Type 2 diabetes mellitus with diabetic chronic kidney disease: Secondary | ICD-10-CM | POA: Diagnosis not present

## 2019-03-30 DIAGNOSIS — J9601 Acute respiratory failure with hypoxia: Secondary | ICD-10-CM | POA: Diagnosis not present

## 2019-03-30 DIAGNOSIS — M109 Gout, unspecified: Secondary | ICD-10-CM | POA: Diagnosis not present

## 2019-03-30 DIAGNOSIS — Z950 Presence of cardiac pacemaker: Secondary | ICD-10-CM | POA: Diagnosis not present

## 2019-03-31 DIAGNOSIS — N2581 Secondary hyperparathyroidism of renal origin: Secondary | ICD-10-CM | POA: Diagnosis not present

## 2019-03-31 DIAGNOSIS — N186 End stage renal disease: Secondary | ICD-10-CM | POA: Diagnosis not present

## 2019-03-31 DIAGNOSIS — Z992 Dependence on renal dialysis: Secondary | ICD-10-CM | POA: Diagnosis not present

## 2019-04-02 DIAGNOSIS — N186 End stage renal disease: Secondary | ICD-10-CM | POA: Diagnosis not present

## 2019-04-02 DIAGNOSIS — E1129 Type 2 diabetes mellitus with other diabetic kidney complication: Secondary | ICD-10-CM | POA: Diagnosis not present

## 2019-04-02 DIAGNOSIS — Z992 Dependence on renal dialysis: Secondary | ICD-10-CM | POA: Diagnosis not present

## 2019-04-03 DIAGNOSIS — N186 End stage renal disease: Secondary | ICD-10-CM | POA: Diagnosis not present

## 2019-04-03 DIAGNOSIS — Z992 Dependence on renal dialysis: Secondary | ICD-10-CM | POA: Diagnosis not present

## 2019-04-03 DIAGNOSIS — N2581 Secondary hyperparathyroidism of renal origin: Secondary | ICD-10-CM | POA: Diagnosis not present

## 2019-04-05 DIAGNOSIS — N2581 Secondary hyperparathyroidism of renal origin: Secondary | ICD-10-CM | POA: Diagnosis not present

## 2019-04-05 DIAGNOSIS — Z992 Dependence on renal dialysis: Secondary | ICD-10-CM | POA: Diagnosis not present

## 2019-04-05 DIAGNOSIS — N186 End stage renal disease: Secondary | ICD-10-CM | POA: Diagnosis not present

## 2019-04-07 DIAGNOSIS — N2581 Secondary hyperparathyroidism of renal origin: Secondary | ICD-10-CM | POA: Diagnosis not present

## 2019-04-07 DIAGNOSIS — N186 End stage renal disease: Secondary | ICD-10-CM | POA: Diagnosis not present

## 2019-04-07 DIAGNOSIS — Z992 Dependence on renal dialysis: Secondary | ICD-10-CM | POA: Diagnosis not present

## 2019-04-10 DIAGNOSIS — N2581 Secondary hyperparathyroidism of renal origin: Secondary | ICD-10-CM | POA: Diagnosis not present

## 2019-04-10 DIAGNOSIS — Z992 Dependence on renal dialysis: Secondary | ICD-10-CM | POA: Diagnosis not present

## 2019-04-10 DIAGNOSIS — N186 End stage renal disease: Secondary | ICD-10-CM | POA: Diagnosis not present

## 2019-04-12 DIAGNOSIS — N186 End stage renal disease: Secondary | ICD-10-CM | POA: Diagnosis not present

## 2019-04-12 DIAGNOSIS — Z992 Dependence on renal dialysis: Secondary | ICD-10-CM | POA: Diagnosis not present

## 2019-04-12 DIAGNOSIS — N2581 Secondary hyperparathyroidism of renal origin: Secondary | ICD-10-CM | POA: Diagnosis not present

## 2019-04-13 ENCOUNTER — Ambulatory Visit: Payer: Medicare HMO

## 2019-04-14 DIAGNOSIS — N186 End stage renal disease: Secondary | ICD-10-CM | POA: Diagnosis not present

## 2019-04-14 DIAGNOSIS — N2581 Secondary hyperparathyroidism of renal origin: Secondary | ICD-10-CM | POA: Diagnosis not present

## 2019-04-14 DIAGNOSIS — Z992 Dependence on renal dialysis: Secondary | ICD-10-CM | POA: Diagnosis not present

## 2019-04-15 ENCOUNTER — Ambulatory Visit: Payer: Medicare HMO

## 2019-04-17 ENCOUNTER — Other Ambulatory Visit: Payer: Self-pay | Admitting: Internal Medicine

## 2019-04-17 ENCOUNTER — Ambulatory Visit
Admission: RE | Admit: 2019-04-17 | Discharge: 2019-04-17 | Disposition: A | Discharge status: Home / Self Care | Payer: Medicare HMO | Source: Ambulatory Visit | Attending: Internal Medicine | Admitting: Internal Medicine

## 2019-04-17 DIAGNOSIS — M545 Low back pain, unspecified: Secondary | ICD-10-CM

## 2019-04-17 DIAGNOSIS — I10 Essential (primary) hypertension: Secondary | ICD-10-CM | POA: Diagnosis not present

## 2019-04-18 ENCOUNTER — Other Ambulatory Visit: Payer: Self-pay | Admitting: Cardiology

## 2019-04-19 DIAGNOSIS — N2581 Secondary hyperparathyroidism of renal origin: Secondary | ICD-10-CM | POA: Diagnosis not present

## 2019-04-19 DIAGNOSIS — Z992 Dependence on renal dialysis: Secondary | ICD-10-CM | POA: Diagnosis not present

## 2019-04-19 DIAGNOSIS — N186 End stage renal disease: Secondary | ICD-10-CM | POA: Diagnosis not present

## 2019-04-21 DIAGNOSIS — N2581 Secondary hyperparathyroidism of renal origin: Secondary | ICD-10-CM | POA: Diagnosis not present

## 2019-04-21 DIAGNOSIS — Z992 Dependence on renal dialysis: Secondary | ICD-10-CM | POA: Diagnosis not present

## 2019-04-21 DIAGNOSIS — N186 End stage renal disease: Secondary | ICD-10-CM | POA: Diagnosis not present

## 2019-04-24 DIAGNOSIS — Z992 Dependence on renal dialysis: Secondary | ICD-10-CM | POA: Diagnosis not present

## 2019-04-24 DIAGNOSIS — N186 End stage renal disease: Secondary | ICD-10-CM | POA: Diagnosis not present

## 2019-04-24 DIAGNOSIS — N2581 Secondary hyperparathyroidism of renal origin: Secondary | ICD-10-CM | POA: Diagnosis not present

## 2019-04-29 DIAGNOSIS — Z992 Dependence on renal dialysis: Secondary | ICD-10-CM | POA: Diagnosis not present

## 2019-04-29 DIAGNOSIS — N186 End stage renal disease: Secondary | ICD-10-CM | POA: Diagnosis not present

## 2019-04-29 DIAGNOSIS — N2581 Secondary hyperparathyroidism of renal origin: Secondary | ICD-10-CM | POA: Diagnosis not present

## 2019-04-30 DIAGNOSIS — J9601 Acute respiratory failure with hypoxia: Secondary | ICD-10-CM | POA: Diagnosis not present

## 2019-04-30 DIAGNOSIS — E1129 Type 2 diabetes mellitus with other diabetic kidney complication: Secondary | ICD-10-CM | POA: Diagnosis not present

## 2019-04-30 DIAGNOSIS — E1122 Type 2 diabetes mellitus with diabetic chronic kidney disease: Secondary | ICD-10-CM | POA: Diagnosis not present

## 2019-04-30 DIAGNOSIS — Z950 Presence of cardiac pacemaker: Secondary | ICD-10-CM | POA: Diagnosis not present

## 2019-04-30 DIAGNOSIS — M109 Gout, unspecified: Secondary | ICD-10-CM | POA: Diagnosis not present

## 2019-04-30 DIAGNOSIS — Z992 Dependence on renal dialysis: Secondary | ICD-10-CM | POA: Diagnosis not present

## 2019-04-30 DIAGNOSIS — N186 End stage renal disease: Secondary | ICD-10-CM | POA: Diagnosis not present

## 2019-05-01 DIAGNOSIS — N186 End stage renal disease: Secondary | ICD-10-CM | POA: Diagnosis not present

## 2019-05-01 DIAGNOSIS — Z992 Dependence on renal dialysis: Secondary | ICD-10-CM | POA: Diagnosis not present

## 2019-05-01 DIAGNOSIS — N2581 Secondary hyperparathyroidism of renal origin: Secondary | ICD-10-CM | POA: Diagnosis not present

## 2019-05-03 DIAGNOSIS — Z992 Dependence on renal dialysis: Secondary | ICD-10-CM | POA: Diagnosis not present

## 2019-05-03 DIAGNOSIS — N186 End stage renal disease: Secondary | ICD-10-CM | POA: Diagnosis not present

## 2019-05-03 DIAGNOSIS — N2581 Secondary hyperparathyroidism of renal origin: Secondary | ICD-10-CM | POA: Diagnosis not present

## 2019-05-04 ENCOUNTER — Ambulatory Visit: Payer: Medicare HMO | Attending: Internal Medicine

## 2019-05-04 DIAGNOSIS — Z23 Encounter for immunization: Secondary | ICD-10-CM

## 2019-05-04 NOTE — Progress Notes (Signed)
   Covid-19 Vaccination Clinic  Name:  Sean Best    MRN: [id number] DOB: June 22, 1933  05/04/2019  Sean Best was observed post Covid-19 immunization for 15 minutes without incident. He was provided with Vaccine Information Sheet and instruction to access the V-Safe system.   Sean Best was instructed to call 911 with any severe reactions post vaccine: Marland Kitchen Difficulty breathing  . Swelling of face and throat  . A fast heartbeat  . A bad rash all over body  . Dizziness and weakness   Immunizations Administered    Name Date Dose VIS Date Route   Pfizer COVID-19 Vaccine 05/04/2019  4:17 PM 0.3 mL 02/10/2019 Intramuscular   Manufacturer: [place]   Lot: [id number]   [place]: [address]-0

## 2019-05-05 DIAGNOSIS — Z992 Dependence on renal dialysis: Secondary | ICD-10-CM | POA: Diagnosis not present

## 2019-05-05 DIAGNOSIS — N2581 Secondary hyperparathyroidism of renal origin: Secondary | ICD-10-CM | POA: Diagnosis not present

## 2019-05-05 DIAGNOSIS — N186 End stage renal disease: Secondary | ICD-10-CM | POA: Diagnosis not present

## 2019-05-08 ENCOUNTER — Ambulatory Visit: Payer: Medicare HMO

## 2019-05-08 DIAGNOSIS — N186 End stage renal disease: Secondary | ICD-10-CM | POA: Diagnosis not present

## 2019-05-08 DIAGNOSIS — N2581 Secondary hyperparathyroidism of renal origin: Secondary | ICD-10-CM | POA: Diagnosis not present

## 2019-05-08 DIAGNOSIS — Z992 Dependence on renal dialysis: Secondary | ICD-10-CM | POA: Diagnosis not present

## 2019-05-10 ENCOUNTER — Ambulatory Visit (INDEPENDENT_AMBULATORY_CARE_PROVIDER_SITE_OTHER): Payer: Medicare HMO | Admitting: *Deleted

## 2019-05-10 DIAGNOSIS — Z950 Presence of cardiac pacemaker: Secondary | ICD-10-CM | POA: Diagnosis not present

## 2019-05-10 DIAGNOSIS — N2581 Secondary hyperparathyroidism of renal origin: Secondary | ICD-10-CM | POA: Diagnosis not present

## 2019-05-10 DIAGNOSIS — Z992 Dependence on renal dialysis: Secondary | ICD-10-CM | POA: Diagnosis not present

## 2019-05-10 DIAGNOSIS — N186 End stage renal disease: Secondary | ICD-10-CM | POA: Diagnosis not present

## 2019-05-10 LAB — CUP PACEART REMOTE DEVICE CHECK
Battery Remaining Longevity: 118 mo
Battery Voltage: 2.94 V
Brady Statistic AP VP Percent: 0.29 %
Brady Statistic AP VS Percent: 0.53 %
Brady Statistic AS VP Percent: 12.62 %
Brady Statistic AS VS Percent: 86.57 %
Brady Statistic RA Percent Paced: 0.89 %
Brady Statistic RV Percent Paced: 12.9 %
Date Time Interrogation Session: 20210310003603
Implantable Lead Implant Date: 20190219
Implantable Lead Implant Date: 20190219
Implantable Lead Location: 753859
Implantable Lead Location: 753860
Implantable Lead Model: 3830
Implantable Lead Model: 5076
Implantable Pulse Generator Implant Date: 20190219
Lead Channel Impedance Value: 304 Ohm
Lead Channel Impedance Value: 304 Ohm
Lead Channel Impedance Value: 399 Ohm
Lead Channel Impedance Value: 418 Ohm
Lead Channel Pacing Threshold Amplitude: 0.625 V
Lead Channel Pacing Threshold Amplitude: 1.5 V
Lead Channel Pacing Threshold Pulse Width: 0.4 ms
Lead Channel Pacing Threshold Pulse Width: 0.4 ms
Lead Channel Sensing Intrinsic Amplitude: 3.875 mV
Lead Channel Sensing Intrinsic Amplitude: 3.875 mV
Lead Channel Sensing Intrinsic Amplitude: 5.25 mV
Lead Channel Sensing Intrinsic Amplitude: 5.25 mV
Lead Channel Setting Pacing Amplitude: 2 V
Lead Channel Setting Pacing Amplitude: 2.5 V
Lead Channel Setting Pacing Pulse Width: 1 ms
Lead Channel Setting Sensing Sensitivity: 1.2 mV

## 2019-05-11 DIAGNOSIS — E7800 Pure hypercholesterolemia, unspecified: Secondary | ICD-10-CM | POA: Diagnosis not present

## 2019-05-11 DIAGNOSIS — M545 Low back pain: Secondary | ICD-10-CM | POA: Diagnosis not present

## 2019-05-11 DIAGNOSIS — I10 Essential (primary) hypertension: Secondary | ICD-10-CM | POA: Diagnosis not present

## 2019-05-11 DIAGNOSIS — G8929 Other chronic pain: Secondary | ICD-10-CM | POA: Diagnosis not present

## 2019-05-11 DIAGNOSIS — I350 Nonrheumatic aortic (valve) stenosis: Secondary | ICD-10-CM | POA: Diagnosis not present

## 2019-05-11 DIAGNOSIS — Z0000 Encounter for general adult medical examination without abnormal findings: Secondary | ICD-10-CM | POA: Diagnosis not present

## 2019-05-11 DIAGNOSIS — I2510 Atherosclerotic heart disease of native coronary artery without angina pectoris: Secondary | ICD-10-CM | POA: Diagnosis not present

## 2019-05-11 DIAGNOSIS — F321 Major depressive disorder, single episode, moderate: Secondary | ICD-10-CM | POA: Diagnosis not present

## 2019-05-11 DIAGNOSIS — Z1389 Encounter for screening for other disorder: Secondary | ICD-10-CM | POA: Diagnosis not present

## 2019-05-11 DIAGNOSIS — E1169 Type 2 diabetes mellitus with other specified complication: Secondary | ICD-10-CM | POA: Diagnosis not present

## 2019-05-11 NOTE — Progress Notes (Signed)
PPM Remote  

## 2019-05-12 DIAGNOSIS — N2581 Secondary hyperparathyroidism of renal origin: Secondary | ICD-10-CM | POA: Diagnosis not present

## 2019-05-12 DIAGNOSIS — N186 End stage renal disease: Secondary | ICD-10-CM | POA: Diagnosis not present

## 2019-05-12 DIAGNOSIS — Z992 Dependence on renal dialysis: Secondary | ICD-10-CM | POA: Diagnosis not present

## 2019-05-15 DIAGNOSIS — N186 End stage renal disease: Secondary | ICD-10-CM | POA: Diagnosis not present

## 2019-05-15 DIAGNOSIS — N2581 Secondary hyperparathyroidism of renal origin: Secondary | ICD-10-CM | POA: Diagnosis not present

## 2019-05-15 DIAGNOSIS — Z992 Dependence on renal dialysis: Secondary | ICD-10-CM | POA: Diagnosis not present

## 2019-05-17 DIAGNOSIS — N186 End stage renal disease: Secondary | ICD-10-CM | POA: Diagnosis not present

## 2019-05-17 DIAGNOSIS — Z992 Dependence on renal dialysis: Secondary | ICD-10-CM | POA: Diagnosis not present

## 2019-05-17 DIAGNOSIS — N2581 Secondary hyperparathyroidism of renal origin: Secondary | ICD-10-CM | POA: Diagnosis not present

## 2019-05-19 DIAGNOSIS — Z992 Dependence on renal dialysis: Secondary | ICD-10-CM | POA: Diagnosis not present

## 2019-05-19 DIAGNOSIS — N186 End stage renal disease: Secondary | ICD-10-CM | POA: Diagnosis not present

## 2019-05-19 DIAGNOSIS — N2581 Secondary hyperparathyroidism of renal origin: Secondary | ICD-10-CM | POA: Diagnosis not present

## 2019-05-22 DIAGNOSIS — Z992 Dependence on renal dialysis: Secondary | ICD-10-CM | POA: Diagnosis not present

## 2019-05-22 DIAGNOSIS — N2581 Secondary hyperparathyroidism of renal origin: Secondary | ICD-10-CM | POA: Diagnosis not present

## 2019-05-22 DIAGNOSIS — N186 End stage renal disease: Secondary | ICD-10-CM | POA: Diagnosis not present

## 2019-05-24 DIAGNOSIS — Z992 Dependence on renal dialysis: Secondary | ICD-10-CM | POA: Diagnosis not present

## 2019-05-24 DIAGNOSIS — N186 End stage renal disease: Secondary | ICD-10-CM | POA: Diagnosis not present

## 2019-05-24 DIAGNOSIS — N2581 Secondary hyperparathyroidism of renal origin: Secondary | ICD-10-CM | POA: Diagnosis not present

## 2019-05-26 DIAGNOSIS — N186 End stage renal disease: Secondary | ICD-10-CM | POA: Diagnosis not present

## 2019-05-26 DIAGNOSIS — N2581 Secondary hyperparathyroidism of renal origin: Secondary | ICD-10-CM | POA: Diagnosis not present

## 2019-05-26 DIAGNOSIS — Z992 Dependence on renal dialysis: Secondary | ICD-10-CM | POA: Diagnosis not present

## 2019-05-28 DIAGNOSIS — M109 Gout, unspecified: Secondary | ICD-10-CM | POA: Diagnosis not present

## 2019-05-28 DIAGNOSIS — Z950 Presence of cardiac pacemaker: Secondary | ICD-10-CM | POA: Diagnosis not present

## 2019-05-28 DIAGNOSIS — J9601 Acute respiratory failure with hypoxia: Secondary | ICD-10-CM | POA: Diagnosis not present

## 2019-05-28 DIAGNOSIS — E1122 Type 2 diabetes mellitus with diabetic chronic kidney disease: Secondary | ICD-10-CM | POA: Diagnosis not present

## 2019-05-29 DIAGNOSIS — Z992 Dependence on renal dialysis: Secondary | ICD-10-CM | POA: Diagnosis not present

## 2019-05-29 DIAGNOSIS — N186 End stage renal disease: Secondary | ICD-10-CM | POA: Diagnosis not present

## 2019-05-29 DIAGNOSIS — N2581 Secondary hyperparathyroidism of renal origin: Secondary | ICD-10-CM | POA: Diagnosis not present

## 2019-05-31 DIAGNOSIS — N186 End stage renal disease: Secondary | ICD-10-CM | POA: Diagnosis not present

## 2019-05-31 DIAGNOSIS — E1129 Type 2 diabetes mellitus with other diabetic kidney complication: Secondary | ICD-10-CM | POA: Diagnosis not present

## 2019-05-31 DIAGNOSIS — Z992 Dependence on renal dialysis: Secondary | ICD-10-CM | POA: Diagnosis not present

## 2019-06-02 DIAGNOSIS — N2581 Secondary hyperparathyroidism of renal origin: Secondary | ICD-10-CM | POA: Diagnosis not present

## 2019-06-02 DIAGNOSIS — Z992 Dependence on renal dialysis: Secondary | ICD-10-CM | POA: Diagnosis not present

## 2019-06-02 DIAGNOSIS — N186 End stage renal disease: Secondary | ICD-10-CM | POA: Diagnosis not present

## 2019-06-07 DIAGNOSIS — N2581 Secondary hyperparathyroidism of renal origin: Secondary | ICD-10-CM | POA: Diagnosis not present

## 2019-06-07 DIAGNOSIS — Z992 Dependence on renal dialysis: Secondary | ICD-10-CM | POA: Diagnosis not present

## 2019-06-07 DIAGNOSIS — N186 End stage renal disease: Secondary | ICD-10-CM | POA: Diagnosis not present

## 2019-06-09 DIAGNOSIS — Z992 Dependence on renal dialysis: Secondary | ICD-10-CM | POA: Diagnosis not present

## 2019-06-09 DIAGNOSIS — N186 End stage renal disease: Secondary | ICD-10-CM | POA: Diagnosis not present

## 2019-06-09 DIAGNOSIS — N2581 Secondary hyperparathyroidism of renal origin: Secondary | ICD-10-CM | POA: Diagnosis not present

## 2019-06-12 DIAGNOSIS — N186 End stage renal disease: Secondary | ICD-10-CM | POA: Diagnosis not present

## 2019-06-12 DIAGNOSIS — N2581 Secondary hyperparathyroidism of renal origin: Secondary | ICD-10-CM | POA: Diagnosis not present

## 2019-06-12 DIAGNOSIS — Z992 Dependence on renal dialysis: Secondary | ICD-10-CM | POA: Diagnosis not present

## 2019-06-14 DIAGNOSIS — N2581 Secondary hyperparathyroidism of renal origin: Secondary | ICD-10-CM | POA: Diagnosis not present

## 2019-06-14 DIAGNOSIS — Z992 Dependence on renal dialysis: Secondary | ICD-10-CM | POA: Diagnosis not present

## 2019-06-14 DIAGNOSIS — N186 End stage renal disease: Secondary | ICD-10-CM | POA: Diagnosis not present

## 2019-06-16 DIAGNOSIS — Z992 Dependence on renal dialysis: Secondary | ICD-10-CM | POA: Diagnosis not present

## 2019-06-16 DIAGNOSIS — N186 End stage renal disease: Secondary | ICD-10-CM | POA: Diagnosis not present

## 2019-06-16 DIAGNOSIS — N2581 Secondary hyperparathyroidism of renal origin: Secondary | ICD-10-CM | POA: Diagnosis not present

## 2019-06-19 DIAGNOSIS — N186 End stage renal disease: Secondary | ICD-10-CM | POA: Diagnosis not present

## 2019-06-19 DIAGNOSIS — N2581 Secondary hyperparathyroidism of renal origin: Secondary | ICD-10-CM | POA: Diagnosis not present

## 2019-06-19 DIAGNOSIS — Z992 Dependence on renal dialysis: Secondary | ICD-10-CM | POA: Diagnosis not present

## 2019-06-21 DIAGNOSIS — N186 End stage renal disease: Secondary | ICD-10-CM | POA: Diagnosis not present

## 2019-06-21 DIAGNOSIS — N2581 Secondary hyperparathyroidism of renal origin: Secondary | ICD-10-CM | POA: Diagnosis not present

## 2019-06-21 DIAGNOSIS — Z992 Dependence on renal dialysis: Secondary | ICD-10-CM | POA: Diagnosis not present

## 2019-06-23 DIAGNOSIS — N186 End stage renal disease: Secondary | ICD-10-CM | POA: Diagnosis not present

## 2019-06-23 DIAGNOSIS — Z992 Dependence on renal dialysis: Secondary | ICD-10-CM | POA: Diagnosis not present

## 2019-06-23 DIAGNOSIS — G4733 Obstructive sleep apnea (adult) (pediatric): Secondary | ICD-10-CM | POA: Diagnosis not present

## 2019-06-23 DIAGNOSIS — N2581 Secondary hyperparathyroidism of renal origin: Secondary | ICD-10-CM | POA: Diagnosis not present

## 2019-06-26 DIAGNOSIS — Z992 Dependence on renal dialysis: Secondary | ICD-10-CM | POA: Diagnosis not present

## 2019-06-26 DIAGNOSIS — N2581 Secondary hyperparathyroidism of renal origin: Secondary | ICD-10-CM | POA: Diagnosis not present

## 2019-06-26 DIAGNOSIS — N186 End stage renal disease: Secondary | ICD-10-CM | POA: Diagnosis not present

## 2019-06-28 DIAGNOSIS — Z992 Dependence on renal dialysis: Secondary | ICD-10-CM | POA: Diagnosis not present

## 2019-06-28 DIAGNOSIS — E1122 Type 2 diabetes mellitus with diabetic chronic kidney disease: Secondary | ICD-10-CM | POA: Diagnosis not present

## 2019-06-28 DIAGNOSIS — Z950 Presence of cardiac pacemaker: Secondary | ICD-10-CM | POA: Diagnosis not present

## 2019-06-28 DIAGNOSIS — N2581 Secondary hyperparathyroidism of renal origin: Secondary | ICD-10-CM | POA: Diagnosis not present

## 2019-06-28 DIAGNOSIS — N186 End stage renal disease: Secondary | ICD-10-CM | POA: Diagnosis not present

## 2019-06-28 DIAGNOSIS — M109 Gout, unspecified: Secondary | ICD-10-CM | POA: Diagnosis not present

## 2019-06-28 DIAGNOSIS — J9601 Acute respiratory failure with hypoxia: Secondary | ICD-10-CM | POA: Diagnosis not present

## 2019-06-30 DIAGNOSIS — E1129 Type 2 diabetes mellitus with other diabetic kidney complication: Secondary | ICD-10-CM | POA: Diagnosis not present

## 2019-06-30 DIAGNOSIS — N2581 Secondary hyperparathyroidism of renal origin: Secondary | ICD-10-CM | POA: Diagnosis not present

## 2019-06-30 DIAGNOSIS — Z992 Dependence on renal dialysis: Secondary | ICD-10-CM | POA: Diagnosis not present

## 2019-06-30 DIAGNOSIS — N186 End stage renal disease: Secondary | ICD-10-CM | POA: Diagnosis not present

## 2019-07-05 DIAGNOSIS — Z992 Dependence on renal dialysis: Secondary | ICD-10-CM | POA: Diagnosis not present

## 2019-07-05 DIAGNOSIS — N2581 Secondary hyperparathyroidism of renal origin: Secondary | ICD-10-CM | POA: Diagnosis not present

## 2019-07-05 DIAGNOSIS — N186 End stage renal disease: Secondary | ICD-10-CM | POA: Diagnosis not present

## 2019-07-10 DIAGNOSIS — N186 End stage renal disease: Secondary | ICD-10-CM | POA: Diagnosis not present

## 2019-07-10 DIAGNOSIS — N2581 Secondary hyperparathyroidism of renal origin: Secondary | ICD-10-CM | POA: Diagnosis not present

## 2019-07-10 DIAGNOSIS — Z992 Dependence on renal dialysis: Secondary | ICD-10-CM | POA: Diagnosis not present

## 2019-07-12 DIAGNOSIS — N2581 Secondary hyperparathyroidism of renal origin: Secondary | ICD-10-CM | POA: Diagnosis not present

## 2019-07-12 DIAGNOSIS — N186 End stage renal disease: Secondary | ICD-10-CM | POA: Diagnosis not present

## 2019-07-12 DIAGNOSIS — Z992 Dependence on renal dialysis: Secondary | ICD-10-CM | POA: Diagnosis not present

## 2019-07-14 DIAGNOSIS — N186 End stage renal disease: Secondary | ICD-10-CM | POA: Diagnosis not present

## 2019-07-14 DIAGNOSIS — N2581 Secondary hyperparathyroidism of renal origin: Secondary | ICD-10-CM | POA: Diagnosis not present

## 2019-07-14 DIAGNOSIS — Z992 Dependence on renal dialysis: Secondary | ICD-10-CM | POA: Diagnosis not present

## 2019-07-17 DIAGNOSIS — N186 End stage renal disease: Secondary | ICD-10-CM | POA: Diagnosis not present

## 2019-07-17 DIAGNOSIS — Z992 Dependence on renal dialysis: Secondary | ICD-10-CM | POA: Diagnosis not present

## 2019-07-17 DIAGNOSIS — N2581 Secondary hyperparathyroidism of renal origin: Secondary | ICD-10-CM | POA: Diagnosis not present

## 2019-07-21 DIAGNOSIS — Z992 Dependence on renal dialysis: Secondary | ICD-10-CM | POA: Diagnosis not present

## 2019-07-21 DIAGNOSIS — N186 End stage renal disease: Secondary | ICD-10-CM | POA: Diagnosis not present

## 2019-07-21 DIAGNOSIS — N2581 Secondary hyperparathyroidism of renal origin: Secondary | ICD-10-CM | POA: Diagnosis not present

## 2019-07-25 DIAGNOSIS — N2581 Secondary hyperparathyroidism of renal origin: Secondary | ICD-10-CM | POA: Diagnosis not present

## 2019-07-25 DIAGNOSIS — N186 End stage renal disease: Secondary | ICD-10-CM | POA: Diagnosis not present

## 2019-07-25 DIAGNOSIS — Z992 Dependence on renal dialysis: Secondary | ICD-10-CM | POA: Diagnosis not present

## 2019-07-27 DIAGNOSIS — G8929 Other chronic pain: Secondary | ICD-10-CM | POA: Diagnosis not present

## 2019-07-27 DIAGNOSIS — N186 End stage renal disease: Secondary | ICD-10-CM | POA: Diagnosis not present

## 2019-07-27 DIAGNOSIS — M25511 Pain in right shoulder: Secondary | ICD-10-CM | POA: Diagnosis not present

## 2019-07-27 DIAGNOSIS — M7541 Impingement syndrome of right shoulder: Secondary | ICD-10-CM | POA: Diagnosis not present

## 2019-07-27 DIAGNOSIS — I10 Essential (primary) hypertension: Secondary | ICD-10-CM | POA: Diagnosis not present

## 2019-07-27 DIAGNOSIS — M25512 Pain in left shoulder: Secondary | ICD-10-CM | POA: Diagnosis not present

## 2019-07-28 DIAGNOSIS — Z950 Presence of cardiac pacemaker: Secondary | ICD-10-CM | POA: Diagnosis not present

## 2019-07-28 DIAGNOSIS — Z992 Dependence on renal dialysis: Secondary | ICD-10-CM | POA: Diagnosis not present

## 2019-07-28 DIAGNOSIS — N2581 Secondary hyperparathyroidism of renal origin: Secondary | ICD-10-CM | POA: Diagnosis not present

## 2019-07-28 DIAGNOSIS — E1122 Type 2 diabetes mellitus with diabetic chronic kidney disease: Secondary | ICD-10-CM | POA: Diagnosis not present

## 2019-07-28 DIAGNOSIS — J9601 Acute respiratory failure with hypoxia: Secondary | ICD-10-CM | POA: Diagnosis not present

## 2019-07-28 DIAGNOSIS — N186 End stage renal disease: Secondary | ICD-10-CM | POA: Diagnosis not present

## 2019-07-28 DIAGNOSIS — M109 Gout, unspecified: Secondary | ICD-10-CM | POA: Diagnosis not present

## 2019-07-31 DIAGNOSIS — N186 End stage renal disease: Secondary | ICD-10-CM | POA: Diagnosis not present

## 2019-07-31 DIAGNOSIS — E1129 Type 2 diabetes mellitus with other diabetic kidney complication: Secondary | ICD-10-CM | POA: Diagnosis not present

## 2019-07-31 DIAGNOSIS — Z992 Dependence on renal dialysis: Secondary | ICD-10-CM | POA: Diagnosis not present

## 2019-08-01 DIAGNOSIS — T82858A Stenosis of vascular prosthetic devices, implants and grafts, initial encounter: Secondary | ICD-10-CM | POA: Diagnosis not present

## 2019-08-01 DIAGNOSIS — Z992 Dependence on renal dialysis: Secondary | ICD-10-CM | POA: Diagnosis not present

## 2019-08-01 DIAGNOSIS — N186 End stage renal disease: Secondary | ICD-10-CM | POA: Diagnosis not present

## 2019-08-01 DIAGNOSIS — I871 Compression of vein: Secondary | ICD-10-CM | POA: Diagnosis not present

## 2019-08-02 DIAGNOSIS — N2581 Secondary hyperparathyroidism of renal origin: Secondary | ICD-10-CM | POA: Diagnosis not present

## 2019-08-02 DIAGNOSIS — Z992 Dependence on renal dialysis: Secondary | ICD-10-CM | POA: Diagnosis not present

## 2019-08-02 DIAGNOSIS — N186 End stage renal disease: Secondary | ICD-10-CM | POA: Diagnosis not present

## 2019-08-04 DIAGNOSIS — N2581 Secondary hyperparathyroidism of renal origin: Secondary | ICD-10-CM | POA: Diagnosis not present

## 2019-08-04 DIAGNOSIS — E46 Unspecified protein-calorie malnutrition: Secondary | ICD-10-CM | POA: Insufficient documentation

## 2019-08-04 DIAGNOSIS — Z992 Dependence on renal dialysis: Secondary | ICD-10-CM | POA: Diagnosis not present

## 2019-08-04 DIAGNOSIS — N186 End stage renal disease: Secondary | ICD-10-CM | POA: Diagnosis not present

## 2019-08-07 DIAGNOSIS — N2581 Secondary hyperparathyroidism of renal origin: Secondary | ICD-10-CM | POA: Diagnosis not present

## 2019-08-07 DIAGNOSIS — N186 End stage renal disease: Secondary | ICD-10-CM | POA: Diagnosis not present

## 2019-08-07 DIAGNOSIS — Z992 Dependence on renal dialysis: Secondary | ICD-10-CM | POA: Diagnosis not present

## 2019-08-09 ENCOUNTER — Ambulatory Visit (INDEPENDENT_AMBULATORY_CARE_PROVIDER_SITE_OTHER): Payer: Medicare HMO | Admitting: *Deleted

## 2019-08-09 DIAGNOSIS — I459 Conduction disorder, unspecified: Secondary | ICD-10-CM

## 2019-08-09 LAB — CUP PACEART REMOTE DEVICE CHECK
Battery Remaining Longevity: 114 mo
Battery Voltage: 2.92 V
Brady Statistic AP VP Percent: 1.07 %
Brady Statistic AP VS Percent: 1.47 %
Brady Statistic AS VP Percent: 44.34 %
Brady Statistic AS VS Percent: 53.12 %
Brady Statistic RA Percent Paced: 2.59 %
Brady Statistic RV Percent Paced: 45.41 %
Date Time Interrogation Session: 20210609013512
Implantable Lead Implant Date: 20190219
Implantable Lead Implant Date: 20190219
Implantable Lead Location: 753859
Implantable Lead Location: 753860
Implantable Lead Model: 3830
Implantable Lead Model: 5076
Implantable Pulse Generator Implant Date: 20190219
Lead Channel Impedance Value: 285 Ohm
Lead Channel Impedance Value: 304 Ohm
Lead Channel Impedance Value: 380 Ohm
Lead Channel Impedance Value: 399 Ohm
Lead Channel Pacing Threshold Amplitude: 0.625 V
Lead Channel Pacing Threshold Amplitude: 1.625 V
Lead Channel Pacing Threshold Pulse Width: 0.4 ms
Lead Channel Pacing Threshold Pulse Width: 0.4 ms
Lead Channel Sensing Intrinsic Amplitude: 3.25 mV
Lead Channel Sensing Intrinsic Amplitude: 3.25 mV
Lead Channel Sensing Intrinsic Amplitude: 6.5 mV
Lead Channel Sensing Intrinsic Amplitude: 6.5 mV
Lead Channel Setting Pacing Amplitude: 2 V
Lead Channel Setting Pacing Amplitude: 2.5 V
Lead Channel Setting Pacing Pulse Width: 1 ms
Lead Channel Setting Sensing Sensitivity: 1.2 mV

## 2019-08-10 NOTE — Progress Notes (Signed)
Remote pacemaker transmission.   

## 2019-08-11 DIAGNOSIS — N186 End stage renal disease: Secondary | ICD-10-CM | POA: Diagnosis not present

## 2019-08-11 DIAGNOSIS — N2581 Secondary hyperparathyroidism of renal origin: Secondary | ICD-10-CM | POA: Diagnosis not present

## 2019-08-11 DIAGNOSIS — Z992 Dependence on renal dialysis: Secondary | ICD-10-CM | POA: Diagnosis not present

## 2019-08-14 DIAGNOSIS — Z992 Dependence on renal dialysis: Secondary | ICD-10-CM | POA: Diagnosis not present

## 2019-08-14 DIAGNOSIS — N2581 Secondary hyperparathyroidism of renal origin: Secondary | ICD-10-CM | POA: Diagnosis not present

## 2019-08-14 DIAGNOSIS — N186 End stage renal disease: Secondary | ICD-10-CM | POA: Diagnosis not present

## 2019-08-16 DIAGNOSIS — N2581 Secondary hyperparathyroidism of renal origin: Secondary | ICD-10-CM | POA: Diagnosis not present

## 2019-08-16 DIAGNOSIS — N186 End stage renal disease: Secondary | ICD-10-CM | POA: Diagnosis not present

## 2019-08-16 DIAGNOSIS — Z992 Dependence on renal dialysis: Secondary | ICD-10-CM | POA: Diagnosis not present

## 2019-08-18 DIAGNOSIS — Z992 Dependence on renal dialysis: Secondary | ICD-10-CM | POA: Diagnosis not present

## 2019-08-18 DIAGNOSIS — N186 End stage renal disease: Secondary | ICD-10-CM | POA: Diagnosis not present

## 2019-08-18 DIAGNOSIS — N2581 Secondary hyperparathyroidism of renal origin: Secondary | ICD-10-CM | POA: Diagnosis not present

## 2019-08-21 DIAGNOSIS — N186 End stage renal disease: Secondary | ICD-10-CM | POA: Diagnosis not present

## 2019-08-21 DIAGNOSIS — N2581 Secondary hyperparathyroidism of renal origin: Secondary | ICD-10-CM | POA: Diagnosis not present

## 2019-08-21 DIAGNOSIS — Z992 Dependence on renal dialysis: Secondary | ICD-10-CM | POA: Diagnosis not present

## 2019-08-23 DIAGNOSIS — N2581 Secondary hyperparathyroidism of renal origin: Secondary | ICD-10-CM | POA: Diagnosis not present

## 2019-08-23 DIAGNOSIS — Z992 Dependence on renal dialysis: Secondary | ICD-10-CM | POA: Diagnosis not present

## 2019-08-23 DIAGNOSIS — N186 End stage renal disease: Secondary | ICD-10-CM | POA: Diagnosis not present

## 2019-08-28 DIAGNOSIS — N2581 Secondary hyperparathyroidism of renal origin: Secondary | ICD-10-CM | POA: Diagnosis not present

## 2019-08-28 DIAGNOSIS — J9601 Acute respiratory failure with hypoxia: Secondary | ICD-10-CM | POA: Diagnosis not present

## 2019-08-28 DIAGNOSIS — Z950 Presence of cardiac pacemaker: Secondary | ICD-10-CM | POA: Diagnosis not present

## 2019-08-28 DIAGNOSIS — E1122 Type 2 diabetes mellitus with diabetic chronic kidney disease: Secondary | ICD-10-CM | POA: Diagnosis not present

## 2019-08-28 DIAGNOSIS — N186 End stage renal disease: Secondary | ICD-10-CM | POA: Diagnosis not present

## 2019-08-28 DIAGNOSIS — Z992 Dependence on renal dialysis: Secondary | ICD-10-CM | POA: Diagnosis not present

## 2019-08-28 DIAGNOSIS — M109 Gout, unspecified: Secondary | ICD-10-CM | POA: Diagnosis not present

## 2019-08-30 DIAGNOSIS — N186 End stage renal disease: Secondary | ICD-10-CM | POA: Diagnosis not present

## 2019-08-30 DIAGNOSIS — E1129 Type 2 diabetes mellitus with other diabetic kidney complication: Secondary | ICD-10-CM | POA: Diagnosis not present

## 2019-08-30 DIAGNOSIS — N2581 Secondary hyperparathyroidism of renal origin: Secondary | ICD-10-CM | POA: Diagnosis not present

## 2019-08-30 DIAGNOSIS — Z992 Dependence on renal dialysis: Secondary | ICD-10-CM | POA: Diagnosis not present

## 2019-09-04 DIAGNOSIS — N2581 Secondary hyperparathyroidism of renal origin: Secondary | ICD-10-CM | POA: Diagnosis not present

## 2019-09-04 DIAGNOSIS — Z992 Dependence on renal dialysis: Secondary | ICD-10-CM | POA: Diagnosis not present

## 2019-09-04 DIAGNOSIS — N186 End stage renal disease: Secondary | ICD-10-CM | POA: Diagnosis not present

## 2019-09-06 DIAGNOSIS — N2581 Secondary hyperparathyroidism of renal origin: Secondary | ICD-10-CM | POA: Diagnosis not present

## 2019-09-06 DIAGNOSIS — N186 End stage renal disease: Secondary | ICD-10-CM | POA: Diagnosis not present

## 2019-09-06 DIAGNOSIS — Z992 Dependence on renal dialysis: Secondary | ICD-10-CM | POA: Diagnosis not present

## 2019-09-11 DIAGNOSIS — N186 End stage renal disease: Secondary | ICD-10-CM | POA: Diagnosis not present

## 2019-09-11 DIAGNOSIS — Z992 Dependence on renal dialysis: Secondary | ICD-10-CM | POA: Diagnosis not present

## 2019-09-11 DIAGNOSIS — N2581 Secondary hyperparathyroidism of renal origin: Secondary | ICD-10-CM | POA: Diagnosis not present

## 2019-09-13 DIAGNOSIS — Z992 Dependence on renal dialysis: Secondary | ICD-10-CM | POA: Diagnosis not present

## 2019-09-13 DIAGNOSIS — N2581 Secondary hyperparathyroidism of renal origin: Secondary | ICD-10-CM | POA: Diagnosis not present

## 2019-09-13 DIAGNOSIS — N186 End stage renal disease: Secondary | ICD-10-CM | POA: Diagnosis not present

## 2019-09-15 DIAGNOSIS — N186 End stage renal disease: Secondary | ICD-10-CM | POA: Diagnosis not present

## 2019-09-15 DIAGNOSIS — Z992 Dependence on renal dialysis: Secondary | ICD-10-CM | POA: Diagnosis not present

## 2019-09-15 DIAGNOSIS — N2581 Secondary hyperparathyroidism of renal origin: Secondary | ICD-10-CM | POA: Diagnosis not present

## 2019-09-19 DIAGNOSIS — R112 Nausea with vomiting, unspecified: Secondary | ICD-10-CM | POA: Diagnosis not present

## 2019-09-19 DIAGNOSIS — N186 End stage renal disease: Secondary | ICD-10-CM | POA: Diagnosis not present

## 2019-09-20 ENCOUNTER — Emergency Department (HOSPITAL_COMMUNITY): Payer: Medicare HMO

## 2019-09-20 ENCOUNTER — Other Ambulatory Visit: Payer: Self-pay

## 2019-09-20 ENCOUNTER — Inpatient Hospital Stay (HOSPITAL_COMMUNITY)
Admission: EM | Admit: 2019-09-20 | Discharge: 2019-09-26 | DRG: 438 | Disposition: A | Discharge status: Home Health | Payer: Medicare HMO | Attending: Internal Medicine | Admitting: Internal Medicine

## 2019-09-20 ENCOUNTER — Encounter (HOSPITAL_COMMUNITY): Payer: Self-pay | Admitting: Emergency Medicine

## 2019-09-20 DIAGNOSIS — I10 Essential (primary) hypertension: Secondary | ICD-10-CM | POA: Diagnosis present

## 2019-09-20 DIAGNOSIS — R935 Abnormal findings on diagnostic imaging of other abdominal regions, including retroperitoneum: Secondary | ICD-10-CM | POA: Diagnosis not present

## 2019-09-20 DIAGNOSIS — Z8546 Personal history of malignant neoplasm of prostate: Secondary | ICD-10-CM

## 2019-09-20 DIAGNOSIS — IMO0002 Reserved for concepts with insufficient information to code with codable children: Secondary | ICD-10-CM | POA: Diagnosis present

## 2019-09-20 DIAGNOSIS — R195 Other fecal abnormalities: Secondary | ICD-10-CM | POA: Diagnosis present

## 2019-09-20 DIAGNOSIS — I459 Conduction disorder, unspecified: Secondary | ICD-10-CM | POA: Diagnosis present

## 2019-09-20 DIAGNOSIS — E1165 Type 2 diabetes mellitus with hyperglycemia: Secondary | ICD-10-CM | POA: Diagnosis not present

## 2019-09-20 DIAGNOSIS — K429 Umbilical hernia without obstruction or gangrene: Secondary | ICD-10-CM | POA: Diagnosis present

## 2019-09-20 DIAGNOSIS — K8500 Idiopathic acute pancreatitis without necrosis or infection: Secondary | ICD-10-CM | POA: Diagnosis not present

## 2019-09-20 DIAGNOSIS — M109 Gout, unspecified: Secondary | ICD-10-CM | POA: Diagnosis present

## 2019-09-20 DIAGNOSIS — Z888 Allergy status to other drugs, medicaments and biological substances status: Secondary | ICD-10-CM | POA: Diagnosis not present

## 2019-09-20 DIAGNOSIS — Z950 Presence of cardiac pacemaker: Secondary | ICD-10-CM | POA: Diagnosis present

## 2019-09-20 DIAGNOSIS — E1122 Type 2 diabetes mellitus with diabetic chronic kidney disease: Secondary | ICD-10-CM | POA: Diagnosis not present

## 2019-09-20 DIAGNOSIS — E1151 Type 2 diabetes mellitus with diabetic peripheral angiopathy without gangrene: Secondary | ICD-10-CM | POA: Diagnosis present

## 2019-09-20 DIAGNOSIS — N186 End stage renal disease: Secondary | ICD-10-CM | POA: Diagnosis not present

## 2019-09-20 DIAGNOSIS — K922 Gastrointestinal hemorrhage, unspecified: Secondary | ICD-10-CM | POA: Diagnosis present

## 2019-09-20 DIAGNOSIS — I739 Peripheral vascular disease, unspecified: Secondary | ICD-10-CM | POA: Diagnosis not present

## 2019-09-20 DIAGNOSIS — K921 Melena: Secondary | ICD-10-CM | POA: Diagnosis not present

## 2019-09-20 DIAGNOSIS — I70209 Unspecified atherosclerosis of native arteries of extremities, unspecified extremity: Secondary | ICD-10-CM | POA: Diagnosis present

## 2019-09-20 DIAGNOSIS — E669 Obesity, unspecified: Secondary | ICD-10-CM | POA: Diagnosis present

## 2019-09-20 DIAGNOSIS — D649 Anemia, unspecified: Secondary | ICD-10-CM | POA: Diagnosis not present

## 2019-09-20 DIAGNOSIS — D631 Anemia in chronic kidney disease: Secondary | ICD-10-CM | POA: Diagnosis present

## 2019-09-20 DIAGNOSIS — I700 Atherosclerosis of aorta: Secondary | ICD-10-CM | POA: Diagnosis present

## 2019-09-20 DIAGNOSIS — Z8673 Personal history of transient ischemic attack (TIA), and cerebral infarction without residual deficits: Secondary | ICD-10-CM

## 2019-09-20 DIAGNOSIS — R1013 Epigastric pain: Secondary | ICD-10-CM | POA: Diagnosis not present

## 2019-09-20 DIAGNOSIS — I120 Hypertensive chronic kidney disease with stage 5 chronic kidney disease or end stage renal disease: Secondary | ICD-10-CM | POA: Diagnosis not present

## 2019-09-20 DIAGNOSIS — Z6830 Body mass index (BMI) 30.0-30.9, adult: Secondary | ICD-10-CM

## 2019-09-20 DIAGNOSIS — E785 Hyperlipidemia, unspecified: Secondary | ICD-10-CM | POA: Diagnosis present

## 2019-09-20 DIAGNOSIS — C61 Malignant neoplasm of prostate: Secondary | ICD-10-CM | POA: Diagnosis not present

## 2019-09-20 DIAGNOSIS — N2581 Secondary hyperparathyroidism of renal origin: Secondary | ICD-10-CM | POA: Diagnosis not present

## 2019-09-20 DIAGNOSIS — E7800 Pure hypercholesterolemia, unspecified: Secondary | ICD-10-CM | POA: Diagnosis not present

## 2019-09-20 DIAGNOSIS — K859 Acute pancreatitis without necrosis or infection, unspecified: Secondary | ICD-10-CM | POA: Diagnosis present

## 2019-09-20 DIAGNOSIS — R9431 Abnormal electrocardiogram [ECG] [EKG]: Secondary | ICD-10-CM | POA: Diagnosis not present

## 2019-09-20 DIAGNOSIS — Z7982 Long term (current) use of aspirin: Secondary | ICD-10-CM

## 2019-09-20 DIAGNOSIS — R197 Diarrhea, unspecified: Secondary | ICD-10-CM | POA: Diagnosis not present

## 2019-09-20 DIAGNOSIS — R112 Nausea with vomiting, unspecified: Secondary | ICD-10-CM | POA: Diagnosis not present

## 2019-09-20 DIAGNOSIS — Z20822 Contact with and (suspected) exposure to covid-19: Secondary | ICD-10-CM | POA: Diagnosis present

## 2019-09-20 DIAGNOSIS — Z992 Dependence on renal dialysis: Secondary | ICD-10-CM | POA: Diagnosis not present

## 2019-09-20 DIAGNOSIS — R5381 Other malaise: Secondary | ICD-10-CM | POA: Diagnosis not present

## 2019-09-20 DIAGNOSIS — H5462 Unqualified visual loss, left eye, normal vision right eye: Secondary | ICD-10-CM | POA: Diagnosis present

## 2019-09-20 DIAGNOSIS — Z7902 Long term (current) use of antithrombotics/antiplatelets: Secondary | ICD-10-CM

## 2019-09-20 DIAGNOSIS — R110 Nausea: Secondary | ICD-10-CM

## 2019-09-20 DIAGNOSIS — Z9119 Patient's noncompliance with other medical treatment and regimen: Secondary | ICD-10-CM

## 2019-09-20 DIAGNOSIS — E118 Type 2 diabetes mellitus with unspecified complications: Secondary | ICD-10-CM

## 2019-09-20 DIAGNOSIS — Z91199 Patient's noncompliance with other medical treatment and regimen due to unspecified reason: Secondary | ICD-10-CM

## 2019-09-20 DIAGNOSIS — Z79899 Other long term (current) drug therapy: Secondary | ICD-10-CM

## 2019-09-20 DIAGNOSIS — Z8249 Family history of ischemic heart disease and other diseases of the circulatory system: Secondary | ICD-10-CM

## 2019-09-20 DIAGNOSIS — E1129 Type 2 diabetes mellitus with other diabetic kidney complication: Secondary | ICD-10-CM | POA: Diagnosis not present

## 2019-09-20 DIAGNOSIS — Z9115 Patient's noncompliance with renal dialysis: Secondary | ICD-10-CM

## 2019-09-20 DIAGNOSIS — N200 Calculus of kidney: Secondary | ICD-10-CM | POA: Diagnosis not present

## 2019-09-20 DIAGNOSIS — Z955 Presence of coronary angioplasty implant and graft: Secondary | ICD-10-CM

## 2019-09-20 DIAGNOSIS — R109 Unspecified abdominal pain: Secondary | ICD-10-CM | POA: Diagnosis not present

## 2019-09-20 DIAGNOSIS — R140 Abdominal distension (gaseous): Secondary | ICD-10-CM | POA: Diagnosis not present

## 2019-09-20 DIAGNOSIS — K8689 Other specified diseases of pancreas: Secondary | ICD-10-CM | POA: Diagnosis not present

## 2019-09-20 DIAGNOSIS — J9601 Acute respiratory failure with hypoxia: Secondary | ICD-10-CM | POA: Diagnosis not present

## 2019-09-20 DIAGNOSIS — G4733 Obstructive sleep apnea (adult) (pediatric): Secondary | ICD-10-CM | POA: Diagnosis not present

## 2019-09-20 DIAGNOSIS — Z743 Need for continuous supervision: Secondary | ICD-10-CM | POA: Diagnosis not present

## 2019-09-20 LAB — CBC
HCT: 23.9 % — ABNORMAL LOW (ref 39.0–52.0)
HCT: 26.5 % — ABNORMAL LOW (ref 39.0–52.0)
Hemoglobin: 7 g/dL — ABNORMAL LOW (ref 13.0–17.0)
Hemoglobin: 7.8 g/dL — ABNORMAL LOW (ref 13.0–17.0)
MCH: 26.5 pg (ref 26.0–34.0)
MCH: 26.8 pg (ref 26.0–34.0)
MCHC: 29.3 g/dL — ABNORMAL LOW (ref 30.0–36.0)
MCHC: 29.4 g/dL — ABNORMAL LOW (ref 30.0–36.0)
MCV: 90.5 fL (ref 80.0–100.0)
MCV: 91.1 fL (ref 80.0–100.0)
Platelets: 314 10*3/uL (ref 150–400)
Platelets: 337 10*3/uL (ref 150–400)
RBC: 2.64 MIL/uL — ABNORMAL LOW (ref 4.22–5.81)
RBC: 2.91 MIL/uL — ABNORMAL LOW (ref 4.22–5.81)
RDW: 20.1 % — ABNORMAL HIGH (ref 11.5–15.5)
RDW: 20.1 % — ABNORMAL HIGH (ref 11.5–15.5)
WBC: 12.3 10*3/uL — ABNORMAL HIGH (ref 4.0–10.5)
WBC: 15.3 10*3/uL — ABNORMAL HIGH (ref 4.0–10.5)
nRBC: 0 % (ref 0.0–0.2)
nRBC: 0 % (ref 0.0–0.2)

## 2019-09-20 LAB — SARS CORONAVIRUS 2 BY RT PCR (HOSPITAL ORDER, PERFORMED IN ~~LOC~~ HOSPITAL LAB): SARS Coronavirus 2: NEGATIVE

## 2019-09-20 LAB — ABO/RH: ABO/RH(D): O POS

## 2019-09-20 LAB — LIPID PANEL
Cholesterol: 129 mg/dL (ref 0–200)
HDL: 39 mg/dL — ABNORMAL LOW (ref >40)
LDL Cholesterol: 69 mg/dL (ref 0–99)
Total CHOL/HDL Ratio: 3.3 RATIO
Triglycerides: 103 mg/dL (ref <150)
VLDL: 21 mg/dL (ref 0–40)

## 2019-09-20 LAB — COMPREHENSIVE METABOLIC PANEL
ALT: 165 U/L — ABNORMAL HIGH (ref 0–44)
AST: 100 U/L — ABNORMAL HIGH (ref 15–41)
Albumin: 2.9 g/dL — ABNORMAL LOW (ref 3.5–5.0)
Alkaline Phosphatase: 312 U/L — ABNORMAL HIGH (ref 38–126)
Anion gap: 16 — ABNORMAL HIGH (ref 5–15)
BUN: 40 mg/dL — ABNORMAL HIGH (ref 8–23)
CO2: 33 mmol/L — ABNORMAL HIGH (ref 22–32)
Calcium: 8.1 mg/dL — ABNORMAL LOW (ref 8.9–10.3)
Chloride: 93 mmol/L — ABNORMAL LOW (ref 98–111)
Creatinine, Ser: 10.11 mg/dL — ABNORMAL HIGH (ref 0.61–1.24)
GFR calc Af Amer: 5 mL/min — ABNORMAL LOW (ref >60)
GFR calc non Af Amer: 4 mL/min — ABNORMAL LOW (ref >60)
Glucose, Bld: 107 mg/dL — ABNORMAL HIGH (ref 70–99)
Potassium: 3.8 mmol/L (ref 3.5–5.1)
Sodium: 142 mmol/L (ref 135–145)
Total Bilirubin: 2 mg/dL — ABNORMAL HIGH (ref 0.3–1.2)
Total Protein: 6 g/dL — ABNORMAL LOW (ref 6.5–8.1)

## 2019-09-20 LAB — CBG MONITORING, ED
Glucose-Capillary: 68 mg/dL — ABNORMAL LOW (ref 70–99)
Glucose-Capillary: 86 mg/dL (ref 70–99)
Glucose-Capillary: 73 mg/dL (ref 70–99)
Glucose-Capillary: 88 mg/dL (ref 70–99)

## 2019-09-20 LAB — DIFFERENTIAL
Abs Immature Granulocytes: 0.12 10*3/uL — ABNORMAL HIGH (ref 0.00–0.07)
Basophils Absolute: 0 10*3/uL (ref 0.0–0.1)
Basophils Relative: 0 %
Eosinophils Absolute: 0 10*3/uL (ref 0.0–0.5)
Eosinophils Relative: 0 %
Immature Granulocytes: 1 %
Lymphocytes Relative: 2 %
Lymphs Abs: 0.3 10*3/uL — ABNORMAL LOW (ref 0.7–4.0)
Monocytes Absolute: 1 10*3/uL (ref 0.1–1.0)
Monocytes Relative: 7 %
Neutro Abs: 13.7 10*3/uL — ABNORMAL HIGH (ref 1.7–7.7)
Neutrophils Relative %: 90 %

## 2019-09-20 LAB — HEMOGLOBIN AND HEMATOCRIT, BLOOD
HCT: 22.7 % — ABNORMAL LOW (ref 39.0–52.0)
Hemoglobin: 6.6 g/dL — CL (ref 13.0–17.0)

## 2019-09-20 LAB — LACTIC ACID, PLASMA
Lactic Acid, Venous: 2.3 mmol/L (ref 0.5–1.9)
Lactic Acid, Venous: 1.8 mmol/L (ref 0.5–1.9)

## 2019-09-20 LAB — ETHANOL: Alcohol, Ethyl (B): <10 mg/dL (ref <10)

## 2019-09-20 LAB — POC OCCULT BLOOD, ED: Fecal Occult Bld: POSITIVE — AB

## 2019-09-20 LAB — HEMOGLOBIN A1C
Hgb A1c MFr Bld: 4.2 % — ABNORMAL LOW (ref 4.8–5.6)
Mean Plasma Glucose: 73.84 mg/dL

## 2019-09-20 LAB — LIPASE, BLOOD: Lipase: 3355 U/L — ABNORMAL HIGH (ref 11–51)

## 2019-09-20 MED ORDER — SODIUM CHLORIDE 0.9 % IV SOLN: INTRAVENOUS | Status: DC

## 2019-09-20 MED ORDER — MORPHINE SULFATE (PF) 2 MG/ML IV SOLN: 2.0000 mg | Freq: Once | INTRAVENOUS | Status: DC

## 2019-09-20 MED ORDER — CALCITRIOL 0.5 MCG PO CAPS
1.0000 ug | ORAL_CAPSULE | Freq: Once | ORAL | Status: AC
  Administered 2019-09-21: 1 ug via ORAL
  Filled 2019-09-20 (×2): qty 2

## 2019-09-20 MED ORDER — INSULIN ASPART 100 UNIT/ML ~~LOC~~ SOLN: 0.0000 [IU] | SUBCUTANEOUS | Status: DC

## 2019-09-20 MED ORDER — CALCITRIOL 0.5 MCG PO CAPS
1.0000 ug | ORAL_CAPSULE | ORAL | Status: DC
  Administered 2019-09-25: 1 ug via ORAL
  Filled 2019-09-20 (×2): qty 2

## 2019-09-20 MED ORDER — HYDRALAZINE HCL 20 MG/ML IJ SOLN
10.0000 mg | Freq: Four times a day (QID) | INTRAMUSCULAR | Status: DC | PRN
  Administered 2019-09-21 – 2019-09-26 (×2): 10 mg via INTRAVENOUS
  Filled 2019-09-20 (×2): qty 1

## 2019-09-20 MED ORDER — CHLORHEXIDINE GLUCONATE CLOTH 2 % EX PADS
6.0000 | MEDICATED_PAD | Freq: Every day | CUTANEOUS | Status: DC
  Administered 2019-09-22 – 2019-09-26 (×5): 6 via TOPICAL

## 2019-09-20 MED ORDER — SODIUM CHLORIDE 0.9% FLUSH
3.0000 mL | Freq: Once | INTRAVENOUS | Status: AC
  Administered 2019-09-20: 3 mL via INTRAVENOUS

## 2019-09-20 MED ORDER — HYDROMORPHONE HCL 1 MG/ML IJ SOLN
1.0000 mg | Freq: Once | INTRAMUSCULAR | Status: AC
  Administered 2019-09-20: 1 mg via INTRAVENOUS
  Filled 2019-09-20: qty 1

## 2019-09-20 MED ORDER — SODIUM CHLORIDE 0.9% IV SOLUTION: Freq: Once | INTRAVENOUS | Status: AC

## 2019-09-20 MED ORDER — DARBEPOETIN ALFA 60 MCG/0.3ML IJ SOSY
60.0000 ug | PREFILLED_SYRINGE | INTRAMUSCULAR | Status: DC
  Filled 2019-09-20: qty 0.3

## 2019-09-20 MED ORDER — METOPROLOL TARTRATE 5 MG/5ML IV SOLN
5.0000 mg | Freq: Three times a day (TID) | INTRAVENOUS | Status: DC
  Administered 2019-09-20 – 2019-09-23 (×8): 5 mg via INTRAVENOUS
  Filled 2019-09-20 (×8): qty 5

## 2019-09-20 MED ORDER — MORPHINE SULFATE (PF) 2 MG/ML IV SOLN: 2.0000 mg | INTRAVENOUS | Status: DC | PRN

## 2019-09-20 MED ORDER — PANTOPRAZOLE SODIUM 40 MG IV SOLR
40.0000 mg | Freq: Two times a day (BID) | INTRAVENOUS | Status: DC
  Administered 2019-09-20 – 2019-09-21 (×2): 40 mg via INTRAVENOUS
  Filled 2019-09-20 (×2): qty 40

## 2019-09-20 MED ORDER — HYDROMORPHONE HCL 1 MG/ML IJ SOLN
1.0000 mg | INTRAMUSCULAR | Status: DC | PRN
  Administered 2019-09-20: 1 mg via INTRAVENOUS
  Filled 2019-09-20 (×2): qty 1

## 2019-09-20 MED ORDER — HYDRALAZINE HCL 20 MG/ML IJ SOLN
5.0000 mg | Freq: Four times a day (QID) | INTRAMUSCULAR | Status: DC | PRN
  Administered 2019-09-20: 10 mg via INTRAVENOUS
  Filled 2019-09-20: qty 1

## 2019-09-20 MED ORDER — IOHEXOL 300 MG/ML  SOLN
100.0000 mL | Freq: Once | INTRAMUSCULAR | Status: AC | PRN
  Administered 2019-09-20: 100 mL via INTRAVENOUS

## 2019-09-20 NOTE — ED Notes (Signed)
Patient transported to X-ray 

## 2019-09-20 NOTE — H&P (Addendum)
[facility] History and Physical  Sean Best PJA:[id number] DOB: 12/21/1933 DOA: 09/20/2019  Referring physician:  PCP: Sean Carol, MD   Chief Complaint: Abdominal pain  HPI: Sean Best is a 86 y.o. male BM PMHx CVA, ESRD on HD M/W/F, HTN, s/p permanent cardiac pacemaker, HLD, prostate cancer, umbilical hernia.  Presents with chief complaint of abdominal pain, nausea, vomiting and diarrhea since Friday.  Patient explains the pain started on Friday after his dialysis treatment. He has felt nauseated and epigastric pain that now has turned into diffuse abdominal pain.  Describes the pain as a constant throbbing pain denies alleviating factors and admits that food tends to make the pain worse.  He admits to vomiting multiple times, denies seeing any blood in his vomit.  Patient admits that he is has had frequent diarrhea without noticing  blood or dark tarry stools.  Patient is a known diabetic, has no history of gallstones or pancreatitis.  Patient has past surgical history of choleyectomy and hernia repair.  Patient has a medical history of arthritis, end-stage renal disease on dialysis, hyperlipidemia, hypertension, type 2 diabetes,.  Patient denies headache, congestion, sore throat, chest pain, shortness of breath, dysuria, leg pain.    Review of Systems:  Covid vaccination; positive vaccination  Constitutional:  No weight loss, night sweats, Fevers, chills, fatigue.  HEENT:  No headaches, Difficulty swallowing,Tooth/dental problems,Sore throat,  No sneezing, itching, ear ache, nasal congestion, post nasal drip,  Cardio-vascular:  No chest pain, Orthopnea, PND, swelling in lower extremities, anasarca, dizziness, palpitations  GI:  No heartburn, indigestion, positive abdominal pain, nausea, vomiting, positive  diarrhea, change in bowel habits, loss of appetite  Resp:  No shortness of breath with exertion or at rest. No excess mucus, no productive cough, No non-productive  cough, No coughing up of blood.No change in color of mucus.No wheezing.No chest wall deformity  Skin:  no rash or lesions.  GU:  no dysuria, change in color of urine, no urgency or frequency. No flank pain.  Musculoskeletal:  No joint pain or swelling. No decreased range of motion. No back pain.  Psych:  No change in mood or affect. No depression or anxiety. No memory loss.   Past Medical History:  Diagnosis Date  . Anemia   . Arthritis    "knees" (04/11/2016)  . Blind left eye    S/P trauma  . Chronic total occlusion of artery of the extremities (HCC)    pt not aware of this  . Claudication ([place])   . ESRD on dialysis [facility])    "MWF; [name]" ((04/10/2016)  . ESRD on peritoneal dialysis [facility])    Started dialysis around April 2015 per son.  Has been doing peritoneal dialysis at home.     . Gout   . Hyperlipidemia   . Hypertension   . Overweight(278.02)   . Presence of permanent cardiac pacemaker    medtronic  . Prostate cancer ([place]) 1990s  . Shingles   . Stroke [facility]) ~1998   denies residual on 04/11/2016  . Type II diabetes mellitus (HCC)    diet controlled  . Umbilical hernia 3/41/9379   Past Surgical History:  Procedure Laterality Date  . A/V FISTULAGRAM N/A 11/01/2018   Procedure: A/V FISTULAGRAM;  Surgeon: Serafina Mitchell, MD;  Location: [place] [facility];  Service: Cardiovascular;  Laterality: N/A;  . AV FISTULA PLACEMENT, RADIOCEPHALIC  02/40/9735   Left arm  . CAPD INSERTION N/A 03/06/2013   Procedure: LAPAROSCOPIC INSERTION CONTINUOUS AMBULATORY PERITONEAL DIALYSIS  (  CAPD) CATHETER;  Surgeon: Edward Jolly, MD;  Location: [place];  Service: General;  Laterality: N/A;  . CATARACT EXTRACTION Right   . CHOLECYSTECTOMY  12/02/2017   LAPROSCOPIC  . CORONARY STENT INTERVENTION N/A 02/11/2018   Procedure: CORONARY STENT INTERVENTION;  Surgeon: Sherren Mocha, MD;  Location: [place] [facility];  Service: Cardiovascular;  Laterality: N/A;  . EYE SURGERY Left 1970s     for eye injury  . HERNIA REPAIR    . INSERTION PROSTATE RADIATION SEED    . LAPAROSCOPIC CHOLECYSTECTOMY SINGLE SITE WITH INTRAOPERATIVE CHOLANGIOGRAM N/A 12/02/2017   Procedure: LAPAROSCOPIC CHOLECYSTECTOMY;  Surgeon: Michael Boston, MD;  Location: [place];  Service: General;  Laterality: N/A;  . LEFT HEART CATH AND CORONARY ANGIOGRAPHY N/A 02/11/2018   Procedure: LEFT HEART CATH AND CORONARY ANGIOGRAPHY;  Surgeon: Sherren Mocha, MD;  Location: [place] [facility];  Service: Cardiovascular;  Laterality: N/A;  . PACEMAKER IMPLANT N/A 04/20/2017   Procedure: PACEMAKER IMPLANT;  Surgeon: Evans Lance, MD;  Location: [place] [facility];  Service: Cardiovascular;  Laterality: N/A;  . PERIPHERAL VASCULAR BALLOON ANGIOPLASTY  11/01/2018   Procedure: PERIPHERAL VASCULAR BALLOON ANGIOPLASTY;  Surgeon: Serafina Mitchell, MD;  Location: [place] [facility];  Service: Cardiovascular;;  Lt lower arm fistula   . UMBILICAL HERNIA REPAIR N/A 03/06/2013   Procedure: HERNIA REPAIR UMBILICAL WITH MESH;  Surgeon: Edward Jolly, MD;  Location: [place];  Service: General;  Laterality: N/A;   Social History:  reports that he has never smoked. He has never used smokeless tobacco. He reports that he does not drink alcohol and does not use drugs.  Allergies  Allergen Reactions  . Cardura [Doxazosin Mesylate] Other (See Comments)    Hallucinations  . Tape Dermatitis    Paper tape ok to use     Family History  Problem Relation Age of Onset  . Cancer Mother   . Heart disease Mother   . Cancer Father     Prior to Admission medications   Medication Sig Start Date End Date Taking? Authorizing Provider  acetaminophen (TYLENOL) 500 MG tablet Take 1,000 mg by mouth every 6 (six) hours as needed for mild pain or headache.   Yes [provider]  allopurinol (ZYLOPRIM) 100 MG tablet Take 100 mg by mouth daily.     Yes [provider]  amLODipine (NORVASC) 5 MG tablet Take 5 mg by mouth daily. 08/18/19   Yes [provider]  aspirin 81 MG chewable tablet Chew 1 tablet (81 mg total) by mouth daily. 02/14/18  Yes Oretha Milch D, MD  atenolol (TENORMIN) 25 MG tablet Take one tablet by mouth daily on NON HEMODIALYSIS days only Patient taking differently: Take 25 mg by mouth daily.  07/12/18  Yes Evans Lance, MD  atorvastatin (LIPITOR) 40 MG tablet Take 40 mg by mouth every evening.  07/07/17  Yes [provider]  calcium acetate (PHOSLO) 667 MG capsule Take 3 capsules (2,001 mg total) by mouth 3 (three) times daily with meals. 04/30/18  Yes Nita Sells, MD  cinacalcet (SENSIPAR) 90 MG tablet Take 90 mg by mouth every Monday, Wednesday, and Friday.    Yes [provider]  ferrous sulfate 325 (65 FE) MG EC tablet Take 325 mg by mouth daily with breakfast.    Yes [provider]  gabapentin (NEURONTIN) 100 MG capsule Take 100 mg by mouth at bedtime. 12/27/16  Yes [provider]  hydrALAZINE (APRESOLINE) 50 MG tablet Take  1 tablet (50 mg total) by mouth 2 (two) times daily. 10/18/18 09/20/19 Yes Arrien, Jimmy Picket, MD  isosorbide mononitrate (IMDUR) 30 MG 24 hr tablet Take 1 tablet (30 mg total) by mouth daily. Patient taking differently: Take 30 mg by mouth 2 (two) times daily. Taking on Cain Saupe, Sat and Sunday 02/14/18  Yes Oretha Milch D, MD  lidocaine-prilocaine (EMLA) cream Apply 1 application topically as needed (for port access).  10/28/17  Yes [provider]  loperamide (IMODIUM) 2 MG capsule Take 1 capsule (2 mg total) by mouth every 6 (six) hours as needed for diarrhea or loose stools. 10/18/18  Yes Arrien, Jimmy Picket, MD  multivitamin (RENA-VIT) TABS tablet Take 1 tablet by mouth 2 (two) times daily.    Yes [provider]  nitroGLYCERIN (NITROSTAT) 0.4 MG SL tablet Place 1 tablet (0.4 mg total) under the tongue every 5 (five) minutes as needed for chest pain. 04/18/19  Yes Leonie Man, MD  ondansetron  (ZOFRAN) 8 MG tablet Take 8 mg by mouth 3 (three) times daily. 09/19/19  Yes [provider]  pantoprazole (PROTONIX) 40 MG tablet Take 40 mg by mouth daily.   Yes [provider]  traMADol (ULTRAM) 50 MG tablet Take 50 mg by mouth 2 (two) times daily as needed for pain. 08/01/19  Yes [provider]  VELPHORO 500 MG chewable tablet Chew 500 mg by mouth 3 (three) times daily with meals.  01/28/18  Yes [provider]  ticagrelor (BRILINTA) 90 MG TABS tablet Take 1 tablet (90 mg total) by mouth 2 (two) times daily. Patient not taking: Reported on 09/20/2019 02/13/18   Desiree Hane, MD     Consultants:  Nephrology GI   Procedures/Significant Events:  -7/21 transfuse 1 unit PRBC   I have personally reviewed and interpreted all radiology studies and my findings are as above.   VENTILATOR SETTINGS:    Cultures 7/21 SARS coronavirus negative  Antimicrobials:    Devices    LINES / TUBES:      Continuous Infusions:  Physical Exam: Vitals:   09/20/19 0703 09/20/19 0924 09/20/19 1343 09/20/19 1631  BP: 138/80 (!) 169/75 (!) 180/83 (!) 184/97  Pulse: 63 66 69 76  Resp: 16 18 17 16   Temp:      TempSrc:      SpO2: 99% 100% 100% 100%    Wt Readings from Last 3 Encounters:  11/22/18 86.6 kg  11/08/18 86.6 kg  11/01/18 87.5 kg    General: A/O x4, No acute respiratory distress Eyes: negative scleral hemorrhage, negative anisocoria, negative icterus ENT: Negative Runny nose, negative gingival bleeding, Neck:  Negative scars, masses, torticollis, lymphadenopathy, JVD Lungs: Clear to auscultation bilaterally without wheezes or crackles Cardiovascular: Regular rate and rhythm without murmur gallop or rub normal S1 and S2 Abdomen:  positive  abdominal pain, nondistended, positive soft, bowel sounds, no rebound, no ascites, no appreciable mass Extremities: No significant cyanosis, clubbing.  Bilateral lower extremity edema 2-3+ Skin:  Negative rashes, lesions, ulcers Psychiatric:  Negative depression, negative anxiety, negative fatigue, negative mania  Central nervous system:  Cranial nerves II through XII intact, tongue/uvula midline, all extremities muscle strength 5/5, sensation intact throughout,  negative dysarthria, negative expressive aphasia, negative receptive aphasia.        Labs on Admission:  Basic Metabolic Panel: Recent Labs  Lab 09/20/19 0435  NA 142  K 3.8  CL 93*  CO2 33*  GLUCOSE 107*  BUN 40*  CREATININE 10.11*  CALCIUM 8.1*   Liver Function Tests: Recent Labs  Lab 09/20/19 0435  AST 100*  ALT 165*  ALKPHOS 312*  BILITOT 2.0*  PROT 6.0*  ALBUMIN 2.9*   Recent Labs  Lab 09/20/19 0435  LIPASE 3,355*   No results for input(s): AMMONIA in the last 168 hours. CBC: Recent Labs  Lab 09/20/19 0435 09/20/19 1310  WBC 12.3* 15.3*  NEUTROABS  --  13.7*  HGB 7.0* 7.8*  HCT 23.9* 26.5*  MCV 90.5 91.1  PLT 314 337   Cardiac Enzymes: No results for input(s): CKTOTAL, CKMB, CKMBINDEX, TROPONINI in the last 168 hours.  BNP (last 3 results) No results for input(s): BNP in the last 8760 hours.  ProBNP (last 3 results) No results for input(s): PROBNP in the last 8760 hours.  CBG: Recent Labs  Lab 09/20/19 1305 09/20/19 1545  GLUCAP 68* 86    Radiological Exams on Admission: DG Abdomen 1 View  Result Date: 09/20/2019 CLINICAL DATA:  Abdominal pain, nausea, and vomiting for 1 week EXAM: ABDOMEN - 1 VIEW COMPARISON:  06/02/2014 FINDINGS: Brachytherapy seed implants in prostate bed. Scattered atherosclerotic calcifications aorta and iliac arteries as well as renal arteries and splenic artery. Multilevel degenerative disc and facet disease changes of lumbar spine with dextroconvex scoliosis. Nonobstructive bowel gas pattern. No bowel dilatation or bowel wall thickening. Slight gaseous distention of stomach. IMPRESSION: Slight gaseous distention of stomach. Nonobstructive bowel gas  pattern. Electronically Signed   By: Lavonia Dana M.D.   On: 09/20/2019 13:12   CT Abdomen Pelvis W Contrast  Result Date: 09/20/2019 CLINICAL DATA:  Diffuse abdominal pain EXAM: CT ABDOMEN AND PELVIS WITH CONTRAST TECHNIQUE: Multidetector CT imaging of the abdomen and pelvis was performed using the standard protocol following bolus administration of intravenous contrast. CONTRAST:  140mL OMNIPAQUE IOHEXOL 300 MG/ML  SOLN COMPARISON:  12/16/2017 FINDINGS: Lower chest: Mild emphysematous changes as well as bibasilar atelectasis. Hepatobiliary: Gallbladder has been surgically removed. Prominence of the biliary tree is noted. This is within normal limits for the post cholecystectomy state but has increased in the interval from the prior exam. Correlation with lab values is recommended. No hepatic mass is seen. Pancreas: Pancreas is well visualized within normal enhancement pattern. No areas of pancreatic necrosis are noted. Very mild inflammatory changes are noted surrounding the pancreas particularly in the region of the pancreatic head. Spleen: Normal in size without focal abnormality. Adrenals/Urinary Tract: Adrenal glands are within normal limits. Multiple cysts are noted throughout both kidneys similar to that seen on prior exam. A few tiny nonobstructing renal calculi are noted bilaterally. The ureters are within normal limits. The bladder is decompressed. Bladder wall thickening is noted and stable from the prior study. Stomach/Bowel: Colon shows no obstructive or inflammatory changes. The stomach is fluid filled. There are inflammatory changes surrounding the proximal duodenum as well as what appears to be a small air-fluid collection although this likely represents a duodenal diverticulum posteriorly as this was seen on the prior study. The possibility of focal duodenal diverticulitis deserves consideration as well. The appendix is within normal limits. Vascular/Lymphatic: Aortic atherosclerosis. Mild  dilatation of the infrarenal aorta is noted to 3.1 Cm. Changes of chronic dissection are seen and stable. No enlarged abdominal or pelvic lymph nodes. Reproductive: Prostate therapy seeds are noted. Other: No abdominal wall hernia or abnormality. No abdominopelvic ascites. Musculoskeletal: Degenerative changes of lumbar spine are seen. IMPRESSION: Mild inflammatory changes surrounding the pancreas consistent with early pancreatitis. No pancreatic necrosis is identified at this  time. This likely contributes to increased dilatation of the biliary tree from local edema. Inflammatory changes are noted surrounding the second portion of the duodenum with what appears to be an inflamed duodenal diverticulum posteriorly. These changes are likely secondary to the known pancreatitis. Stable aortic aneurysm measuring 3.1 cm. Electronically Signed   By: Inez Catalina M.D.   On: 09/20/2019 15:00    EKG: Independently reviewed.  Pending  Assessment/Plan Principal Problem:   Acute pancreatitis Active Problems:   HTN (hypertension)   Heart block   Cardiac pacemaker in situ (Medtronic DDD)   End-stage renal disease on hemodialysis (HCC)   HLD (hyperlipidemia)   Prostate cancer ([place])   Umbilical hernia   History of noncompliance with medical treatment   Diabetes mellitus type 2, uncontrolled, with complications ([place])   Acute GI bleeding   Acute pancreatitis -Appears to be idiopathic.  Patient states negative consumption of EtOH -7/21 EtOH negative -Urine tox screen pending -Unable to start fluids secondary to nephrology stating they cannot provide HD until tomorrow morning.   Essential HTN -Hydralazine IV PRN -Metoprolol IV TID  Heart block s/p cardiac pacemaker -Stable  ESRD on HD M/W/F -Per nephrology will perform HD in the a.m.  DM type II controlled with complications -8/67 hemoglobin A1c= 4.2 -Very sensitive SSI  HLD -7/21 LDL= 69  Noncompliance with medical treatment -Per patient  secondary to significant diarrhea is why he missed his HD on Monday.  Umbilical hernia -Reducible no indication of entrapment  Acute GI bleed (baseline Hg 9.5) -7/21 heme positive -ED contacted GI who will see patient in a.m. -H/H q 6 hr -Do not want to start Protonix drip outpatient secondary to fluid load and not receiving HD until a.m. -Protonix IV 40 mg BID -Transfuse for hemoglobin<7 -7/21 transfuse 1 unit PRBC  Prostate cancer -Review of patient's chart does not appear to have anything recent.  We will have to inquire of  patient in the a.m.     Code Status: Full (DVT Prophylaxis: SCD Family Communication:   Status is: Inpatient    Dispo: The patient is from: Home              Anticipated d/c is to: Home              Anticipated d/c date is: 8/1              Patient currently unstable     Data Reviewed: Care during the described time interval was provided by me .  I have reviewed this patient's available data, including medical history, events of note, physical examination, and all test results as part of my evaluation.   The patient is critically ill with multiple organ systems failure and requires high complexity decision making for assessment and support, frequent evaluation and titration of therapies, application of advanced monitoring technologies and extensive interpretation of multiple databases. Critical Care Time devoted to patient care services described in this note  Time spent: 62 minutes   Charnise Lovan, [facility] Pager [phone]

## 2019-09-20 NOTE — ED Triage Notes (Addendum)
Pt presents to ED BIB [facility] from home. Pt c/o LUQ abd pain, distention, n/v since leaving dialysis on fri. Pt missed dialysis mon, last dialysis on 09/15/19.

## 2019-09-20 NOTE — ED Notes (Addendum)
PT resting.  

## 2019-09-20 NOTE — Consult Note (Signed)
Reason for Consult: Continuity of ESRD care Referring Physician: Gareth Morgan, MD  HPI:  86 year old African-American man with past medical history significant for hypertension, peripheral vascular disease, history of CVA, type 2 diabetes mellitus and end-stage renal disease on hemodialysis on a Monday/Wednesday/Friday.  He unfortunately missed his dialysis on Monday 7/19 because of diarrhea and presented to the emergency room this morning with severe epigastric abdominal pain which was stabbing in nature and radiating to both of his sides.  He reported some associated nausea with dry heaving/vomiting but without any diarrhea, hematochezia, melena or hematemesis.  He denies any chest pain or shortness of breath and currently is comfortable after some intravenous analgesics.  He denies any fevers or chills and does not have any cough or sputum production.  Dialysis prescription: MWF, [place], 4 hours, 2K/2.5 calcium, BFR 450, DFR AF 2.0, EDW 83 kg, UF profile #2, left radiocephalic fistula, 15 G needles, no heparin, calcitriol 1 mcg 3 times weekly, Mircera 225 mcg IV every 2 weeks, Benadryl 50 mg IV q. HD as needed,  Past Medical History:  Diagnosis Date  . Anemia   . Arthritis    "knees" (04/11/2016)  . Blind left eye    S/P trauma  . Chronic total occlusion of artery of the extremities (HCC)    pt not aware of this  . Claudication ([place])   . ESRD on dialysis [facility])    "MWF; [name]" ((04/10/2016)  . ESRD on peritoneal dialysis [facility])    Started dialysis around April 2015 per son.  Has been doing peritoneal dialysis at home.     . Gout   . Hyperlipidemia   . Hypertension   . Overweight(278.02)   . Presence of permanent cardiac pacemaker    medtronic  . Prostate cancer ([place]) 1990s  . Shingles   . Stroke [facility]) ~1998   denies residual on 04/11/2016  . Type II diabetes mellitus (HCC)    diet controlled  . Umbilical hernia 03/02/7508    Past Surgical History:  Procedure  Laterality Date  . A/V FISTULAGRAM N/A 11/01/2018   Procedure: A/V FISTULAGRAM;  Surgeon: Serafina Mitchell, MD;  Location: [place] [facility];  Service: Cardiovascular;  Laterality: N/A;  . AV FISTULA PLACEMENT, RADIOCEPHALIC  25/85/2778   Left arm  . CAPD INSERTION N/A 03/06/2013   Procedure: LAPAROSCOPIC INSERTION CONTINUOUS AMBULATORY PERITONEAL DIALYSIS  (CAPD) CATHETER;  Surgeon: Edward Jolly, MD;  Location: [place];  Service: General;  Laterality: N/A;  . CATARACT EXTRACTION Right   . CHOLECYSTECTOMY  12/02/2017   LAPROSCOPIC  . CORONARY STENT INTERVENTION N/A 02/11/2018   Procedure: CORONARY STENT INTERVENTION;  Surgeon: Sherren Mocha, MD;  Location: [place] [facility];  Service: Cardiovascular;  Laterality: N/A;  . EYE SURGERY Left 1970s   for eye injury  . HERNIA REPAIR    . INSERTION PROSTATE RADIATION SEED    . LAPAROSCOPIC CHOLECYSTECTOMY SINGLE SITE WITH INTRAOPERATIVE CHOLANGIOGRAM N/A 12/02/2017   Procedure: LAPAROSCOPIC CHOLECYSTECTOMY;  Surgeon: Michael Boston, MD;  Location: [place];  Service: General;  Laterality: N/A;  . LEFT HEART CATH AND CORONARY ANGIOGRAPHY N/A 02/11/2018   Procedure: LEFT HEART CATH AND CORONARY ANGIOGRAPHY;  Surgeon: Sherren Mocha, MD;  Location: [place] [facility];  Service: Cardiovascular;  Laterality: N/A;  . PACEMAKER IMPLANT N/A 04/20/2017   Procedure: PACEMAKER IMPLANT;  Surgeon: Evans Lance, MD;  Location: [place] [facility];  Service: Cardiovascular;  Laterality: N/A;  . PERIPHERAL VASCULAR BALLOON ANGIOPLASTY  11/01/2018  Procedure: PERIPHERAL VASCULAR BALLOON ANGIOPLASTY;  Surgeon: Serafina Mitchell, MD;  Location: [place] [facility];  Service: Cardiovascular;;  Lt lower arm fistula   . UMBILICAL HERNIA REPAIR N/A 03/06/2013   Procedure: HERNIA REPAIR UMBILICAL WITH MESH;  Surgeon: Edward Jolly, MD;  Location: [facility] OR;  Service: General;  Laterality: N/A;    Family History  Problem Relation Age of Onset  . Cancer Mother   .  Heart disease Mother   . Cancer Father     Social History:  reports that he has never smoked. He has never used smokeless tobacco. He reports that he does not drink alcohol and does not use drugs.  Allergies:  Allergies  Allergen Reactions  . Cardura [Doxazosin Mesylate] Other (See Comments)    Hallucinations  . Tape Dermatitis    Paper tape ok to use     Medications:  Scheduled: . [START ON 09/21/2019] Chlorhexidine Gluconate Cloth  6 each Topical Q0600  . [START ON 09/21/2019] darbepoetin (ARANESP) injection - DIALYSIS  60 mcg Intravenous Q Thu-HD    Results for orders placed or performed during the hospital encounter of 09/20/19 (from the past 48 hour(s))  Lipase, blood     Status: Abnormal   Collection Time: 09/20/19  4:35 AM  Result Value Ref Range   Lipase 3,355 (H) 11 - 51 U/L    Comment: RESULTS CONFIRMED BY MANUAL DILUTION Performed at [facility], [address], [place], [place] [address]   Comprehensive metabolic panel     Status: Abnormal   Collection Time: 09/20/19  4:35 AM  Result Value Ref Range   Sodium 142 135 - 145 mmol/L   Potassium 3.8 3.5 - 5.1 mmol/L   Chloride 93 (L) 98 - 111 mmol/L   CO2 33 (H) 22 - 32 mmol/L   Glucose, Bld 107 (H) 70 - 99 mg/dL    Comment: Glucose reference range applies only to samples taken after fasting for at least 8 hours.   BUN 40 (H) 8 - 23 mg/dL   Creatinine, Ser 10.11 (H) 0.61 - 1.24 mg/dL   Calcium 8.1 (L) 8.9 - 10.3 mg/dL   Total Protein 6.0 (L) 6.5 - 8.1 g/dL   Albumin 2.9 (L) 3.5 - 5.0 g/dL   AST 100 (H) 15 - 41 U/L   ALT 165 (H) 0 - 44 U/L   Alkaline Phosphatase 312 (H) 38 - 126 U/L   Total Bilirubin 2.0 (H) 0.3 - 1.2 mg/dL   GFR calc non Af Amer 4 (L) >60 mL/min   GFR calc Af Amer 5 (L) >60 mL/min   Anion gap 16 (H) 5 - 15    Comment: Performed at [facility], [place] [address], [place], [place] [address]  CBC     Status: Abnormal   Collection Time: 09/20/19  4:35 AM  Result Value Ref Range   WBC  12.3 (H) 4.0 - 10.5 K/uL   RBC 2.64 (L) 4.22 - 5.81 MIL/uL   Hemoglobin 7.0 (L) 13.0 - 17.0 g/dL   HCT 23.9 (L) 39 - 52 %   MCV 90.5 80.0 - 100.0 fL   MCH 26.5 26.0 - 34.0 pg   MCHC 29.3 (L) 30.0 - 36.0 g/dL   RDW 20.1 (H) 11.5 - 15.5 %   Platelets 314 150 - 400 K/uL   nRBC 0.0 0.0 - 0.2 %    Comment: Performed at [facility] [address] [place], [place] [address]  Type and screen [place]     Status: None   Collection Time: 09/20/19 12:55 PM  Result Value Ref Range   ABO/RH(D) O POS    Antibody Screen NEG    Sample Expiration      09/23/2019,2359 Performed at [facility], [place] [address], [place], [place] [address]   CBG monitoring, ED     Status: Abnormal   Collection Time: 09/20/19  1:05 PM  Result Value Ref Range   Glucose-Capillary 68 (L) 70 - 99 mg/dL    Comment: Glucose reference range applies only to samples taken after fasting for at least 8 hours.   Comment 1 Notify RN    Comment 2 Document in Chart   Differential     Status: Abnormal   Collection Time: 09/20/19  1:10 PM  Result Value Ref Range   Neutrophils Relative % 90 %   Neutro Abs 13.7 (H) 1.7 - 7.7 K/uL   Lymphocytes Relative 2 %   Lymphs Abs 0.3 (L) 0.7 - 4.0 K/uL   Monocytes Relative 7 %   Monocytes Absolute 1.0 0 - 1 K/uL   Eosinophils Relative 0 %   Eosinophils Absolute 0.0 0 - 0 K/uL   Basophils Relative 0 %   Basophils Absolute 0.0 0 - 0 K/uL   Immature Granulocytes 1 %   Abs Immature Granulocytes 0.12 (H) 0.00 - 0.07 K/uL    Comment: Performed at [facility], [address], [place], [place] [address]  CBC     Status: Abnormal   Collection Time: 09/20/19  1:10 PM  Result Value Ref Range   WBC 15.3 (H) 4.0 - 10.5 K/uL   RBC 2.91 (L) 4.22 - 5.81 MIL/uL   Hemoglobin 7.8 (L) 13.0 - 17.0 g/dL   HCT 26.5 (L) 39 - 52 %   MCV 91.1 80.0 - 100.0 fL   MCH 26.8 26.0 - 34.0 pg   MCHC 29.4 (L) 30.0 - 36.0 g/dL   RDW 20.1 (H) 11.5 - 15.5 %   Platelets 337 150  - 400 K/uL   nRBC 0.0 0.0 - 0.2 %    Comment: Performed at [facility] [address], [place], [place] [address]  Hemoglobin A1c     Status: Abnormal   Collection Time: 09/20/19  1:10 PM  Result Value Ref Range   Hgb A1c MFr Bld 4.2 (L) 4.8 - 5.6 %    Comment: (NOTE) Pre diabetes:          5.7%-6.4%  Diabetes:              >6.4%  Glycemic control for   <7.0% adults with diabetes    Mean Plasma Glucose 73.84 mg/dL    Comment: Performed at [facility] [address], [place], [place] [address]  POC occult blood, ED     Status: Abnormal   Collection Time: 09/20/19  1:25 PM  Result Value Ref Range   Fecal Occult Bld POSITIVE (A) NEGATIVE  SARS Coronavirus 2 by RT PCR (hospital order, performed in [facility] hospital lab) Nasopharyngeal Nasopharyngeal Swab     Status: None   Collection Time: 09/20/19  2:12 PM   Specimen: Nasopharyngeal Swab  Result Value Ref Range   SARS Coronavirus 2 NEGATIVE NEGATIVE    Comment: (NOTE) SARS-CoV-2 target nucleic acids are NOT DETECTED.  The SARS-CoV-2 RNA is generally detectable in upper and lower respiratory specimens during the acute phase of infection. The  lowest concentration of SARS-CoV-2 viral copies this assay can detect is 250 copies / mL. A negative result does not preclude SARS-CoV-2 infection and should not be used as the sole basis for treatment or other patient management decisions.  A negative result may occur with improper specimen collection / handling, submission of specimen other than nasopharyngeal swab, presence of viral mutation(s) within the areas targeted by this assay, and inadequate number of viral copies (<250 copies / mL). A negative result must be combined with clinical observations, patient history, and epidemiological information.  Fact Sheet for Patients:   StrictlyIdeas.no  Fact Sheet for Healthcare Providers: BankingDealers.co.za  This test is  not yet approved or  cleared by the [place] FDA and has been authorized for detection and/or diagnosis of SARS-CoV-2 by FDA under an Emergency Use Authorization (EUA).  This EUA will remain in effect (meaning this test can be used) for the duration of the COVID-19 declaration under Section 564(b)(1) of the Act, 21 U.S.C. section 360bbb-3(b)(1), unless the authorization is terminated or revoked sooner.  Performed at [facility], [place] [address], [place], [place] [address]   CBG monitoring, ED     Status: None   Collection Time: 09/20/19  3:45 PM  Result Value Ref Range   Glucose-Capillary 86 70 - 99 mg/dL    Comment: Glucose reference range applies only to samples taken after fasting for at least 8 hours.  ABO/Rh     Status: None   Collection Time: 09/20/19  4:21 PM  Result Value Ref Range   ABO/RH(D)      O POS Performed at [facility] [address] [place], [place] [address]   Lactic acid, plasma     Status: Abnormal   Collection Time: 09/20/19  4:22 PM  Result Value Ref Range   Lactic Acid, Venous 2.3 (HH) 0.5 - 1.9 mmol/L    Comment: CRITICAL RESULT CALLED TO, READ BACK BY AND VERIFIED WITH: J.LYON RN [id number] 09/20/19 MCCORMICK K  Performed at [facility] [address], [place], [place] [address]     DG Abdomen 1 View  Result Date: 09/20/2019 CLINICAL DATA:  Abdominal pain, nausea, and vomiting for 1 week EXAM: ABDOMEN - 1 VIEW COMPARISON:  06/02/2014 FINDINGS: Brachytherapy seed implants in prostate bed. Scattered atherosclerotic calcifications aorta and iliac arteries as well as renal arteries and splenic artery. Multilevel degenerative disc and facet disease changes of lumbar spine with dextroconvex scoliosis. Nonobstructive bowel gas pattern. No bowel dilatation or bowel wall thickening. Slight gaseous distention of stomach. IMPRESSION: Slight gaseous distention of stomach. Nonobstructive bowel gas pattern. Electronically Signed   By: Lavonia Dana  M.D.   On: 09/20/2019 13:12   CT Abdomen Pelvis W Contrast  Result Date: 09/20/2019 CLINICAL DATA:  Diffuse abdominal pain EXAM: CT ABDOMEN AND PELVIS WITH CONTRAST TECHNIQUE: Multidetector CT imaging of the abdomen and pelvis was performed using the standard protocol following bolus administration of intravenous contrast. CONTRAST:  138mL OMNIPAQUE IOHEXOL 300 MG/ML  SOLN COMPARISON:  12/16/2017 FINDINGS: Lower chest: Mild emphysematous changes as well as bibasilar atelectasis. Hepatobiliary: Gallbladder has been surgically removed. Prominence of the biliary tree is noted. This is within normal limits for the post cholecystectomy state but has increased in the interval from the prior exam. Correlation with lab values is recommended. No hepatic mass is seen. Pancreas: Pancreas is well visualized within normal enhancement pattern. No areas of pancreatic necrosis are noted. Very mild inflammatory changes are noted surrounding  the pancreas particularly in the region of the pancreatic head. Spleen: Normal in size without focal abnormality. Adrenals/Urinary Tract: Adrenal glands are within normal limits. Multiple cysts are noted throughout both kidneys similar to that seen on prior exam. A few tiny nonobstructing renal calculi are noted bilaterally. The ureters are within normal limits. The bladder is decompressed. Bladder wall thickening is noted and stable from the prior study. Stomach/Bowel: Colon shows no obstructive or inflammatory changes. The stomach is fluid filled. There are inflammatory changes surrounding the proximal duodenum as well as what appears to be a small air-fluid collection although this likely represents a duodenal diverticulum posteriorly as this was seen on the prior study. The possibility of focal duodenal diverticulitis deserves consideration as well. The appendix is within normal limits. Vascular/Lymphatic: Aortic atherosclerosis. Mild dilatation of the infrarenal aorta is noted to 3.1 Cm.  Changes of chronic dissection are seen and stable. No enlarged abdominal or pelvic lymph nodes. Reproductive: Prostate therapy seeds are noted. Other: No abdominal wall hernia or abnormality. No abdominopelvic ascites. Musculoskeletal: Degenerative changes of lumbar spine are seen. IMPRESSION: Mild inflammatory changes surrounding the pancreas consistent with early pancreatitis. No pancreatic necrosis is identified at this time. This likely contributes to increased dilatation of the biliary tree from local edema. Inflammatory changes are noted surrounding the second portion of the duodenum with what appears to be an inflamed duodenal diverticulum posteriorly. These changes are likely secondary to the known pancreatitis. Stable aortic aneurysm measuring 3.1 cm. Electronically Signed   By: Inez Catalina M.D.   On: 09/20/2019 15:00    Review of Systems  Constitutional: Positive for appetite change and fatigue. Negative for chills and fever.  HENT: Negative for congestion, nosebleeds, sore throat and trouble swallowing.   Eyes: Negative for pain, redness and visual disturbance.  Respiratory: Negative for cough, chest tightness, shortness of breath and wheezing.   Cardiovascular: Positive for leg swelling. Negative for chest pain.  Gastrointestinal: Positive for abdominal pain, nausea and vomiting. Negative for diarrhea.  Endocrine: Negative for polydipsia and polyuria.  Genitourinary: Negative for dysuria, flank pain, hematuria and urgency.  Musculoskeletal: Negative for back pain, gait problem and myalgias.  Skin: Negative for pallor and rash.  Neurological: Positive for headaches. Negative for dizziness, tremors and speech difficulty.  Psychiatric/Behavioral: Negative for confusion. The patient is not nervous/anxious.    Blood pressure (!) 184/97, pulse 76, temperature 98.3 F (36.8 C), temperature source Oral, resp. rate 16, SpO2 100 %. Physical Exam Vitals and nursing note reviewed.   Constitutional:      General: He is not in acute distress.    Appearance: He is well-developed. He is obese. He is not ill-appearing or toxic-appearing.  HENT:     Head: Normocephalic and atraumatic.     Mouth/Throat:     Mouth: Mucous membranes are moist.     Pharynx: Oropharynx is clear. No oropharyngeal exudate.  Eyes:     General: No scleral icterus.    Extraocular Movements: Extraocular movements intact.     Pupils: Pupils are equal, round, and reactive to light.  Cardiovascular:     Rate and Rhythm: Normal rate and regular rhythm.     Heart sounds: Normal heart sounds. No murmur heard.      Arteriovenous access: left arteriovenous access is present.    Comments: Left radiocephalic fistula, large/tortuous Pulmonary:     Breath sounds: Normal breath sounds. No wheezing, rhonchi or rales.     Comments: Poor inspiratory effort with decreased breath sounds  over bases Abdominal:     General: Abdomen is protuberant. Bowel sounds are normal.     Palpations: Abdomen is soft.     Tenderness: There is abdominal tenderness in the right upper quadrant, epigastric area and left upper quadrant.  Musculoskeletal:     Right lower leg: 2+ Pitting Edema present.     Left lower leg: 2+ Pitting Edema present.  Skin:    General: Skin is warm.  Neurological:     Mental Status: He is alert.     Assessment/Plan: 1.  Acute pancreatitis: No preceding alcohol use and appears to be idiopathic.  Ongoing symptomatic management with bowel rest. 2.  End-stage renal disease: With missed hemodialysis treatment on Monday/Wednesday of this week.  No acute indications for dialysis at this time and will be scheduled for hemodialysis electively tomorrow.  He does have pedal edema and I will order for ultrafiltration with hemodialysis tomorrow. 3.  Hypertension: Likely worsened by volume status, resume oral antihypertensive therapy and monitor with hemodialysis/ultrafiltration tomorrow. 4.  Anemia: With  underlying chronic kidney disease, agree with monitoring serial H&H for possible decline associated with hemorrhagic pancreatitis.  Will order for ESA with dialysis tomorrow. 5.  Secondary hyperparathyroidism: Will resume calcitriol with dialysis and resume binders when back on renal diet.  Cindel Daugherty K. 09/20/2019, 6:01 PM

## 2019-09-20 NOTE — ED Provider Notes (Signed)
[facility] EMERGENCY DEPARTMENT Provider Note   CSN: [id number] Arrival date & time: 09/20/19  7782     History Chief Complaint  Patient presents with  . Abdominal Pain    Sean Best is a 86 y.o. male.  HPI   Patient presents to the emergency department with chief complaint of abdominal pain, nausea, vomiting and diarrhea since Friday.  Patient explains the pain started on Friday after his dialysis treatment. He has felt nauseated and epigastric pain that now has turned into diffuse abdominal pain.  Describes the pain as a constant throbbing pain denies alleviating factors and admits that food tends to make the pain worse.  He admits to vomiting multiple times, denies seeing any blood in his vomit.  Patient admits that he is has had frequent diarrhea without noticing  blood or dark tarry stools.  Patient is a known diabetic, has no history of gallstones or pancreatitis.  Patient has past surgical history of choleyectomy and hernia repair.  Patient has a medical history of arthritis, end-stage renal disease on dialysis, hyperlipidemia, hypertension, type 2 diabetes,.  Patient denies headache, congestion, sore throat, chest pain, shortness of breath, dysuria, leg pain.  Past Medical History:  Diagnosis Date  . Anemia   . Arthritis    "knees" (04/11/2016)  . Blind left eye    S/P trauma  . Chronic total occlusion of artery of the extremities (HCC)    pt not aware of this  . Claudication ([place])   . ESRD on dialysis [facility])    "MWF; [name]" ((04/10/2016)  . ESRD on peritoneal dialysis [facility])    Started dialysis around April 2015 per son.  Has been doing peritoneal dialysis at home.     . Gout   . Hyperlipidemia   . Hypertension   . Overweight(278.02)   . Presence of permanent cardiac pacemaker    medtronic  . Prostate cancer ([place]) 1990s  . Shingles   . Stroke [facility]) ~1998   denies residual on 04/11/2016  . Type II diabetes mellitus (HCC)    diet controlled    . Umbilical hernia 06/23/5359    Patient Active Problem List   Diagnosis Date Noted  . End-stage renal disease on hemodialysis ([place]) 09/20/2019  . HLD (hyperlipidemia) 09/20/2019  . Prostate cancer ([place]) 09/20/2019  . Umbilical hernia 44/31/5400  . Acute pancreatitis 09/20/2019  . History of noncompliance with medical treatment 09/20/2019  . Diabetes mellitus type 2, uncontrolled, with complications ([place]) [birth year]/1950  . Acute GI bleeding 09/20/2019  . Shoulder pain, bilateral 11/22/2018  . Impingement syndrome of right shoulder 11/08/2018  . Hypertensive emergency 10/16/2018  . Hypertensive encephalopathy 10/15/2018  . Left bundle branch block (LBBB) determined by electrocardiography 02/13/2018  . Aortic stenosis 02/13/2018  . NSTEMI (non-ST elevated myocardial infarction) ([place]) 02/13/2018  . Constipation 12/08/2017  . Abdominal pain 12/06/2017  . Leukocytosis 12/06/2017  . Gout 12/06/2017  . Chronic pain 12/06/2017  . Acute on chronic cholecystitis s/p lap cholecystectomy 12/02/2017 12/02/2017  . Cardiac pacemaker in situ (Medtronic DDD) 12/02/2017  . Acute hypoxemic respiratory failure ([place]) 04/18/2017  . Heart block 04/17/2017  . Elevated troponin 04/17/2017  . ESRD on hemodialysis ([place]) 04/12/2016  . H/O: stroke 04/11/2016  . Saccular aneurysm of infrarenal aorta with thrombosed dissection of abdominal aorta 04/11/2016  . Obesity (BMI 30-39.9) 04/11/2016  . Syncope 04/11/2016  . Prostate CA ([place]) 12/28/2014  . Chest pain 12/28/2014  . C. difficile colitis 10/04/2014  . Hyperkalemia 03/11/2013  .  Anemia 03/11/2013  . DM (diabetes mellitus), type 2 with renal complications, diet controlled 03/11/2013  . HTN (hypertension) 03/11/2013  . Hyperlipidemia 03/11/2013  . ESRD on dialysis [facility]) 03/20/2011    Past Surgical History:  Procedure Laterality Date  . A/V FISTULAGRAM N/A 11/01/2018   Procedure: A/V FISTULAGRAM;  Surgeon: Serafina Mitchell, MD;  Location: [place] [facility];  Service: Cardiovascular;  Laterality: N/A;  . AV FISTULA PLACEMENT, RADIOCEPHALIC  94/17/4081   Left arm  . CAPD INSERTION N/A 03/06/2013   Procedure: LAPAROSCOPIC INSERTION CONTINUOUS AMBULATORY PERITONEAL DIALYSIS  (CAPD) CATHETER;  Surgeon: Edward Jolly, MD;  Location: [facility];  Service: General;  Laterality: N/A;  . CATARACT EXTRACTION Right   . CHOLECYSTECTOMY  12/02/2017   LAPROSCOPIC  . CORONARY STENT INTERVENTION N/A 02/11/2018   Procedure: CORONARY STENT INTERVENTION;  Surgeon: Sherren Mocha, MD;  Location: [place] [facility];  Service: Cardiovascular;  Laterality: N/A;  . EYE SURGERY Left 1970s   for eye injury  . HERNIA REPAIR    . INSERTION PROSTATE RADIATION SEED    . LAPAROSCOPIC CHOLECYSTECTOMY SINGLE SITE WITH INTRAOPERATIVE CHOLANGIOGRAM N/A 12/02/2017   Procedure: LAPAROSCOPIC CHOLECYSTECTOMY;  Surgeon: Michael Boston, MD;  Location: [facility];  Service: General;  Laterality: N/A;  . LEFT HEART CATH AND CORONARY ANGIOGRAPHY N/A 02/11/2018   Procedure: LEFT HEART CATH AND CORONARY ANGIOGRAPHY;  Surgeon: Sherren Mocha, MD;  Location: [place] [facility];  Service: Cardiovascular;  Laterality: N/A;  . PACEMAKER IMPLANT N/A 04/20/2017   Procedure: PACEMAKER IMPLANT;  Surgeon: Evans Lance, MD;  Location: [place] [facility];  Service: Cardiovascular;  Laterality: N/A;  . PERIPHERAL VASCULAR BALLOON ANGIOPLASTY  11/01/2018   Procedure: PERIPHERAL VASCULAR BALLOON ANGIOPLASTY;  Surgeon: Serafina Mitchell, MD;  Location: [place] [facility];  Service: Cardiovascular;;  Lt lower arm fistula   . UMBILICAL HERNIA REPAIR N/A 03/06/2013   Procedure: HERNIA REPAIR UMBILICAL WITH MESH;  Surgeon: Edward Jolly, MD;  Location: [facility] OR;  Service: General;  Laterality: N/A;       Family History  Problem Relation Age of Onset  . Cancer Mother   . Heart disease Mother   . Cancer Father     Social History   Tobacco Use  . Smoking status: Never Smoker  . Smokeless tobacco:  Never Used  Vaping Use  . Vaping Use: Never used  Substance Use Topics  . Alcohol use: No  . Drug use: No    Home Medications Prior to Admission medications   Medication Sig Start Date End Date Taking? Authorizing Provider  acetaminophen (TYLENOL) 500 MG tablet Take 1,000 mg by mouth every 6 (six) hours as needed for mild pain or headache.   Yes [provider]  allopurinol (ZYLOPRIM) 100 MG tablet Take 100 mg by mouth daily.     Yes [provider]  amLODipine (NORVASC) 5 MG tablet Take 5 mg by mouth daily. 08/18/19  Yes [provider]  aspirin 81 MG chewable tablet Chew 1 tablet (81 mg total) by mouth daily. 02/14/18  Yes Oretha Milch D, MD  atenolol (TENORMIN) 25 MG tablet Take one tablet by mouth daily on NON HEMODIALYSIS days only Patient taking differently: Take 25 mg by mouth daily.  07/12/18  Yes Evans Lance, MD  atorvastatin (LIPITOR) 40 MG tablet Take 40 mg by mouth every evening.  07/07/17  Yes [provider]  calcium acetate (PHOSLO) 667 MG capsule Take 3 capsules (2,001 mg total) by  mouth 3 (three) times daily with meals. 04/30/18  Yes Nita Sells, MD  cinacalcet (SENSIPAR) 90 MG tablet Take 90 mg by mouth every Monday, Wednesday, and Friday.    Yes [provider]  ferrous sulfate 325 (65 FE) MG EC tablet Take 325 mg by mouth daily with breakfast.    Yes [provider]  gabapentin (NEURONTIN) 100 MG capsule Take 100 mg by mouth at bedtime. 12/27/16  Yes [provider]  hydrALAZINE (APRESOLINE) 50 MG tablet Take 1 tablet (50 mg total) by mouth 2 (two) times daily. 10/18/18 09/20/19 Yes Arrien, Jimmy Picket, MD  isosorbide mononitrate (IMDUR) 30 MG 24 hr tablet Take 1 tablet (30 mg total) by mouth daily. Patient taking differently: Take 30 mg by mouth 2 (two) times daily. Taking on Cain Saupe, Sat and Sunday 02/14/18  Yes Oretha Milch D, MD  lidocaine-prilocaine (EMLA) cream Apply 1 application  topically as needed (for port access).  10/28/17  Yes [provider]  loperamide (IMODIUM) 2 MG capsule Take 1 capsule (2 mg total) by mouth every 6 (six) hours as needed for diarrhea or loose stools. 10/18/18  Yes Arrien, Jimmy Picket, MD  multivitamin (RENA-VIT) TABS tablet Take 1 tablet by mouth 2 (two) times daily.    Yes [provider]  nitroGLYCERIN (NITROSTAT) 0.4 MG SL tablet Place 1 tablet (0.4 mg total) under the tongue every 5 (five) minutes as needed for chest pain. 04/18/19  Yes Leonie Man, MD  ondansetron (ZOFRAN) 8 MG tablet Take 8 mg by mouth 3 (three) times daily. 09/19/19  Yes [provider]  pantoprazole (PROTONIX) 40 MG tablet Take 40 mg by mouth daily.   Yes [provider]  traMADol (ULTRAM) 50 MG tablet Take 50 mg by mouth 2 (two) times daily as needed for pain. 08/01/19  Yes [provider]  VELPHORO 500 MG chewable tablet Chew 500 mg by mouth 3 (three) times daily with meals.  01/28/18  Yes [provider]  ticagrelor (BRILINTA) 90 MG TABS tablet Take 1 tablet (90 mg total) by mouth 2 (two) times daily. Patient not taking: Reported on 09/20/2019 02/13/18   Desiree Hane, MD    Allergies    Cardura [doxazosin mesylate] and Tape  Review of Systems   Review of Systems  Constitutional: Negative for chills and fever.  HENT: Negative for congestion, sore throat and trouble swallowing.   Respiratory: Negative for cough and shortness of breath.   Cardiovascular: Negative for chest pain, palpitations and leg swelling.  Gastrointestinal: Positive for abdominal pain, diarrhea, nausea and vomiting. Negative for blood in stool.  Genitourinary: Negative for enuresis, flank pain and frequency.  Musculoskeletal: Negative for back pain and joint swelling.  Skin: Negative for rash.  Neurological: Negative for dizziness and headaches.  Hematological: Does not bruise/bleed easily.    Physical Exam Updated Vital Signs BP  (!) 184/97   Pulse 76   Temp 98.3 F (36.8 C) (Oral)   Resp 16   SpO2 100%   Physical Exam Vitals and nursing note reviewed.  Constitutional:      General: He is not in acute distress.    Appearance: He is not ill-appearing.  HENT:     Head: Normocephalic and atraumatic.     Nose: No congestion.     Mouth/Throat:     Mouth: Mucous membranes are moist.     Pharynx: Oropharynx is clear.  Eyes:     General: No scleral icterus. Cardiovascular:  Rate and Rhythm: Normal rate and regular rhythm.     Pulses: Normal pulses.     Heart sounds: No murmur heard.  No friction rub. No gallop.   Pulmonary:     Effort: No respiratory distress.     Breath sounds: No wheezing, rhonchi or rales.  Abdominal:     General: There is no distension.     Tenderness: There is no abdominal tenderness. There is no guarding.     Comments: Patient's abdomen is distended, tympanic to percussion, normal active bowel sounds, tenderness to palpation in her epigastric region, positive rebound tenderness, negative Hoffmeier sign, McBurney point.   Musculoskeletal:        General: No swelling.     Right lower leg: Edema present.     Left lower leg: Edema present.  Skin:    General: Skin is warm and dry.     Capillary Refill: Capillary refill takes less than 2 seconds.     Findings: No rash.  Neurological:     General: No focal deficit present.     Mental Status: He is alert and oriented to person, place, and time.  Psychiatric:        Mood and Affect: Mood normal.     ED Results / Procedures / Treatments   Labs (all labs ordered are listed, but only abnormal results are displayed) Labs Reviewed  LIPASE, BLOOD - Abnormal; Notable for the following components:      Result Value   Lipase 3,355 (*)    All other components within normal limits  COMPREHENSIVE METABOLIC PANEL - Abnormal; Notable for the following components:   Chloride 93 (*)    CO2 33 (*)    Glucose, Bld 107 (*)    BUN 40 (*)     Creatinine, Ser 10.11 (*)    Calcium 8.1 (*)    Total Protein 6.0 (*)    Albumin 2.9 (*)    AST 100 (*)    ALT 165 (*)    Alkaline Phosphatase 312 (*)    Total Bilirubin 2.0 (*)    GFR calc non Af Amer 4 (*)    GFR calc Af Amer 5 (*)    Anion gap 16 (*)    All other components within normal limits  CBC - Abnormal; Notable for the following components:   WBC 12.3 (*)    RBC 2.64 (*)    Hemoglobin 7.0 (*)    HCT 23.9 (*)    MCHC 29.3 (*)    RDW 20.1 (*)    All other components within normal limits  DIFFERENTIAL - Abnormal; Notable for the following components:   Neutro Abs 13.7 (*)    Lymphs Abs 0.3 (*)    Abs Immature Granulocytes 0.12 (*)    All other components within normal limits  CBC - Abnormal; Notable for the following components:   WBC 15.3 (*)    RBC 2.91 (*)    Hemoglobin 7.8 (*)    HCT 26.5 (*)    MCHC 29.4 (*)    RDW 20.1 (*)    All other components within normal limits  LACTIC ACID, PLASMA - Abnormal; Notable for the following components:   Lactic Acid, Venous 2.3 (*)    All other components within normal limits  HEMOGLOBIN A1C - Abnormal; Notable for the following components:   Hgb A1c MFr Bld 4.2 (*)    All other components within normal limits  CBG MONITORING, ED - Abnormal; Notable for the following  components:   Glucose-Capillary 68 (*)    All other components within normal limits  POC OCCULT BLOOD, ED - Abnormal; Notable for the following components:   Fecal Occult Bld POSITIVE (*)    All other components within normal limits  SARS CORONAVIRUS 2 BY RT PCR (HOSPITAL ORDER, [facility])  URINALYSIS, ROUTINE W REFLEX MICROSCOPIC  LACTIC ACID, PLASMA  ETHANOL  RAPID URINE DRUG SCREEN, HOSP PERFORMED  LIPID PANEL  COMPREHENSIVE METABOLIC PANEL  MAGNESIUM  PHOSPHORUS  CBC WITH DIFFERENTIAL/PLATELET  HEMOGLOBIN AND HEMATOCRIT, BLOOD  HEMOGLOBIN AND HEMATOCRIT, BLOOD  CBG MONITORING, ED  TYPE AND SCREEN  ABO/RH     EKG None  Radiology DG Abdomen 1 View  Result Date: 09/20/2019 CLINICAL DATA:  Abdominal pain, nausea, and vomiting for 1 week EXAM: ABDOMEN - 1 VIEW COMPARISON:  06/02/2014 FINDINGS: Brachytherapy seed implants in prostate bed. Scattered atherosclerotic calcifications aorta and iliac arteries as well as renal arteries and splenic artery. Multilevel degenerative disc and facet disease changes of lumbar spine with dextroconvex scoliosis. Nonobstructive bowel gas pattern. No bowel dilatation or bowel wall thickening. Slight gaseous distention of stomach. IMPRESSION: Slight gaseous distention of stomach. Nonobstructive bowel gas pattern. Electronically Signed   By: Lavonia Dana M.D.   On: 09/20/2019 13:12   CT Abdomen Pelvis W Contrast  Result Date: 09/20/2019 CLINICAL DATA:  Diffuse abdominal pain EXAM: CT ABDOMEN AND PELVIS WITH CONTRAST TECHNIQUE: Multidetector CT imaging of the abdomen and pelvis was performed using the standard protocol following bolus administration of intravenous contrast. CONTRAST:  146m OMNIPAQUE IOHEXOL 300 MG/ML  SOLN COMPARISON:  12/16/2017 FINDINGS: Lower chest: Mild emphysematous changes as well as bibasilar atelectasis. Hepatobiliary: Gallbladder has been surgically removed. Prominence of the biliary tree is noted. This is within normal limits for the post cholecystectomy state but has increased in the interval from the prior exam. Correlation with lab values is recommended. No hepatic mass is seen. Pancreas: Pancreas is well visualized within normal enhancement pattern. No areas of pancreatic necrosis are noted. Very mild inflammatory changes are noted surrounding the pancreas particularly in the region of the pancreatic head. Spleen: Normal in size without focal abnormality. Adrenals/Urinary Tract: Adrenal glands are within normal limits. Multiple cysts are noted throughout both kidneys similar to that seen on prior exam. A few tiny nonobstructing renal calculi are  noted bilaterally. The ureters are within normal limits. The bladder is decompressed. Bladder wall thickening is noted and stable from the prior study. Stomach/Bowel: Colon shows no obstructive or inflammatory changes. The stomach is fluid filled. There are inflammatory changes surrounding the proximal duodenum as well as what appears to be a small air-fluid collection although this likely represents a duodenal diverticulum posteriorly as this was seen on the prior study. The possibility of focal duodenal diverticulitis deserves consideration as well. The appendix is within normal limits. Vascular/Lymphatic: Aortic atherosclerosis. Mild dilatation of the infrarenal aorta is noted to 3.1 Cm. Changes of chronic dissection are seen and stable. No enlarged abdominal or pelvic lymph nodes. Reproductive: Prostate therapy seeds are noted. Other: No abdominal wall hernia or abnormality. No abdominopelvic ascites. Musculoskeletal: Degenerative changes of lumbar spine are seen. IMPRESSION: Mild inflammatory changes surrounding the pancreas consistent with early pancreatitis. No pancreatic necrosis is identified at this time. This likely contributes to increased dilatation of the biliary tree from local edema. Inflammatory changes are noted surrounding the second portion of the duodenum with what appears to be an inflamed duodenal diverticulum posteriorly. These changes are  likely secondary to the known pancreatitis. Stable aortic aneurysm measuring 3.1 cm. Electronically Signed   By: Inez Catalina M.D.   On: 09/20/2019 15:00    Procedures Procedures (including critical care time)  Medications Ordered in ED Medications  0.9 %  sodium chloride infusion (has no administration in time range)  morphine 2 MG/ML injection 2 mg (has no administration in time range)  hydrALAZINE (APRESOLINE) injection 5 mg (has no administration in time range)  Chlorhexidine Gluconate Cloth 2 % PADS 6 each (has no administration in time  range)  Darbepoetin Alfa (ARANESP) injection 60 mcg (has no administration in time range)  sodium chloride flush (NS) 0.9 % injection 3 mL (3 mLs Intravenous Given 09/20/19 1319)  HYDROmorphone (DILAUDID) injection 1 mg (1 mg Intravenous Given 09/20/19 1315)  iohexol (OMNIPAQUE) 300 MG/ML solution 100 mL (100 mLs Intravenous Contrast Given 09/20/19 1427)    ED Course  I have reviewed the triage vital signs and the nursing notes.  Pertinent labs & imaging results that were available during my care of the patient were reviewed by me and considered in my medical decision making (see chart for details).    MDM Rules/Calculators/A&P                          I have personally reviewed all imaging, labs and have interpreted them.  Due to patient complaint most concern for perforated bowel versus small bowel obstruction versus pancreatitis versus choledocholithiasis.  X-ray of abdomen did not show free air making perforation unlikely.  CT abdomen pelvis was obtained showed mild inflammation around the pancreas consistent with pancreatitis, no necrosis identified, increased dilation of the biliary tree most likely secondary from local edema.  There was inflammation surrounding the second portion of the duodenum likely 2 on meal diverticulum.  This is consistent with physical exam findings of epigastric pain as well as a lipase of 3355.  CBC showed leukocytosis of 15.3 most likely secondary to acute pancreatitis.  Hemoglobin of 7.8 with a positive Hemoccult.  Recommend further evaluation for possible GI bleed versus anemia of chronic diseases.  CMP showed electrolyte abnormalities consistent with missed dialysis, as well as elevated liver enzymes and alk phos which is consistent with pancreatitis.  No signs of hypo or hyperkalemia.  EKG showed sinus rhythm without signs of ischemia.  patient was given pain medication which she responded well to.   Consulted GI due to dilation of biliary tree.  Spoke with Dr.  Bunnie Domino of [facility] who will come evaluate the patient tomorrow.  Consulted nephrology due to missed dialysis treatment. Spoke with Dr. Posey Pronto of [facility] who will come and evaluate the patient and determine when patient will go for dialysis. consult hospitalist team for admission due to acute pancreatitis.  Spoke with Dr. Sherral Hammers who will come evaluate the patient and will admit them for acute pancreatitis.  Likely patient suffering from acute pancreatitis and will need further evaluation and treatment.  Patient care will be transferred to hospitalist team.    Final Clinical Impression(s) / ED Diagnoses Final diagnoses:  Epigastric pain  ESRD (end stage renal disease) ([place])  Nausea    Rx / DC Orders ED Discharge Orders    None       Marcello Fennel, PA-C 09/20/19 1817    Gareth Morgan, MD 09/20/19 2132

## 2019-09-20 NOTE — ED Notes (Signed)
Date and time results received: 09/20/19  7:23 PM  Test: H&H Critical Value: 6.6 hemoglobin   Name of Provider Notified: MD Sherral Hammers

## 2019-09-20 NOTE — ED Notes (Signed)
MD Sherral Hammers notified of b/p 183/93. Verbal order of 10 mg hydralazine and to d/c fluids.

## 2019-09-20 NOTE — ED Notes (Signed)
Date and time results received: 09/20/19  4:59 PM  Test: lactic Critical Value: 2.3  Name of Provider Notified: PA Ileene Patrick

## 2019-09-21 DIAGNOSIS — I10 Essential (primary) hypertension: Secondary | ICD-10-CM

## 2019-09-21 DIAGNOSIS — E1165 Type 2 diabetes mellitus with hyperglycemia: Secondary | ICD-10-CM

## 2019-09-21 LAB — HEMOGLOBIN AND HEMATOCRIT, BLOOD
HCT: 28.4 % — ABNORMAL LOW (ref 39.0–52.0)
HCT: 19.4 % — ABNORMAL LOW (ref 39.0–52.0)
Hemoglobin: 8.5 g/dL — ABNORMAL LOW (ref 13.0–17.0)
Hemoglobin: 5.9 g/dL — CL (ref 13.0–17.0)

## 2019-09-21 LAB — HEPATITIS B SURFACE ANTIGEN: Hepatitis B Surface Ag: NONREACTIVE

## 2019-09-21 LAB — TYPE AND SCREEN
ABO/RH(D): O POS
Antibody Screen: NEGATIVE
Unit division: 0

## 2019-09-21 LAB — CBC WITH DIFFERENTIAL/PLATELET
Abs Immature Granulocytes: 0.18 10*3/uL — ABNORMAL HIGH (ref 0.00–0.07)
Basophils Absolute: 0 10*3/uL (ref 0.0–0.1)
Basophils Relative: 0 %
Eosinophils Absolute: 0 10*3/uL (ref 0.0–0.5)
Eosinophils Relative: 0 %
HCT: 27.8 % — ABNORMAL LOW (ref 39.0–52.0)
Hemoglobin: 8.3 g/dL — ABNORMAL LOW (ref 13.0–17.0)
Immature Granulocytes: 1 %
Lymphocytes Relative: 4 %
Lymphs Abs: 0.7 10*3/uL (ref 0.7–4.0)
MCH: 26.9 pg (ref 26.0–34.0)
MCHC: 29.9 g/dL — ABNORMAL LOW (ref 30.0–36.0)
MCV: 90 fL (ref 80.0–100.0)
Monocytes Absolute: 0.4 10*3/uL (ref 0.1–1.0)
Monocytes Relative: 2 %
Neutro Abs: 17.6 10*3/uL — ABNORMAL HIGH (ref 1.7–7.7)
Neutrophils Relative %: 93 %
Platelets: 320 10*3/uL (ref 150–400)
RBC: 3.09 MIL/uL — ABNORMAL LOW (ref 4.22–5.81)
RDW: 19.3 % — ABNORMAL HIGH (ref 11.5–15.5)
WBC: 18.9 10*3/uL — ABNORMAL HIGH (ref 4.0–10.5)
nRBC: 0 % (ref 0.0–0.2)

## 2019-09-21 LAB — COMPREHENSIVE METABOLIC PANEL
ALT: 144 U/L — ABNORMAL HIGH (ref 0–44)
AST: 75 U/L — ABNORMAL HIGH (ref 15–41)
Albumin: 2.8 g/dL — ABNORMAL LOW (ref 3.5–5.0)
Alkaline Phosphatase: 335 U/L — ABNORMAL HIGH (ref 38–126)
Anion gap: 14 (ref 5–15)
BUN: 48 mg/dL — ABNORMAL HIGH (ref 8–23)
CO2: 29 mmol/L (ref 22–32)
Calcium: 7.8 mg/dL — ABNORMAL LOW (ref 8.9–10.3)
Chloride: 95 mmol/L — ABNORMAL LOW (ref 98–111)
Creatinine, Ser: 10.61 mg/dL — ABNORMAL HIGH (ref 0.61–1.24)
GFR calc Af Amer: 5 mL/min — ABNORMAL LOW (ref >60)
GFR calc non Af Amer: 4 mL/min — ABNORMAL LOW (ref >60)
Glucose, Bld: 150 mg/dL — ABNORMAL HIGH (ref 70–99)
Potassium: 4.6 mmol/L (ref 3.5–5.1)
Sodium: 138 mmol/L (ref 135–145)
Total Bilirubin: 3.3 mg/dL — ABNORMAL HIGH (ref 0.3–1.2)
Total Protein: 6 g/dL — ABNORMAL LOW (ref 6.5–8.1)

## 2019-09-21 LAB — GLUCOSE, CAPILLARY
Glucose-Capillary: 35 mg/dL — CL (ref 70–99)
Glucose-Capillary: 41 mg/dL — CL (ref 70–99)
Glucose-Capillary: 76 mg/dL (ref 70–99)
Glucose-Capillary: 81 mg/dL (ref 70–99)

## 2019-09-21 LAB — MAGNESIUM: Magnesium: 3.3 mg/dL — ABNORMAL HIGH (ref 1.7–2.4)

## 2019-09-21 LAB — CBG MONITORING, ED
Glucose-Capillary: 132 mg/dL — ABNORMAL HIGH (ref 70–99)
Glucose-Capillary: 45 mg/dL — ABNORMAL LOW (ref 70–99)
Glucose-Capillary: 79 mg/dL (ref 70–99)
Glucose-Capillary: 92 mg/dL (ref 70–99)

## 2019-09-21 LAB — BPAM RBC
Blood Product Expiration Date: 202108192359
ISSUE DATE / TIME: 202107212009
Unit Type and Rh: 5100

## 2019-09-21 LAB — HEPATITIS B SURFACE ANTIBODY,QUALITATIVE: Hep B S Ab: REACTIVE — AB

## 2019-09-21 LAB — PHOSPHORUS: Phosphorus: 6.1 mg/dL — ABNORMAL HIGH (ref 2.5–4.6)

## 2019-09-21 LAB — LIPASE, BLOOD: Lipase: 1374 U/L — ABNORMAL HIGH (ref 11–51)

## 2019-09-21 MED ORDER — SODIUM CHLORIDE 0.9 % IV SOLN: INTRAVENOUS | Status: DC

## 2019-09-21 MED ORDER — DARBEPOETIN ALFA 60 MCG/0.3ML IJ SOSY
PREFILLED_SYRINGE | INTRAMUSCULAR | Status: AC
  Administered 2019-09-21: 60 ug via INTRAVENOUS
  Filled 2019-09-21: qty 0.3

## 2019-09-21 MED ORDER — DEXTROSE 50 % IV SOLN
1.0000 | Freq: Once | INTRAVENOUS | Status: AC
  Administered 2019-09-21: 50 mL via INTRAVENOUS
  Filled 2019-09-21: qty 50

## 2019-09-21 NOTE — Progress Notes (Signed)
[facility] Progress Note   Subjective:   Seen on HD. Says nausea, abd issues improving.  Edema is new this week with missed HD  Objective Vitals:   09/21/19 0649 09/21/19 0702 09/21/19 0730 09/21/19 0800  BP: (P) 116/63 (P) 132/66 (!) (P) 144/84 (P) 140/78  Pulse: (P) 71 (P) 80 (P) 72 (P) 74  Resp: (P) 20 (P) 18 (P) 20 (P) 20  Temp:      TempSrc:      SpO2: (P) 95%      Physical Exam General: elderly man, comfortable Heart: RRR Lungs: clear ant Abdomen: soft, nontender with mod palpation Extremities: 2+ edema Dialysis Access:  L RC AVF aneurysmal large, Qb 400  Additional Objective Labs: Basic Metabolic Panel: Recent Labs  Lab 09/20/19 0435 09/21/19 0437  NA 142 138  K 3.8 4.6  CL 93* 95*  CO2 33* 29  GLUCOSE 107* 150*  BUN 40* 48*  CREATININE 10.11* 10.61*  CALCIUM 8.1* 7.8*  PHOS  --  6.1*   Liver Function Tests: Recent Labs  Lab 09/20/19 0435 09/21/19 0437  AST 100* 75*  ALT 165* 144*  ALKPHOS 312* 335*  BILITOT 2.0* 3.3*  PROT 6.0* 6.0*  ALBUMIN 2.9* 2.8*   Recent Labs  Lab 09/20/19 0435 09/21/19 0437  LIPASE 3,355* 1,374*   CBC: Recent Labs  Lab 09/20/19 0435 09/20/19 0435 09/20/19 1310 09/20/19 1835 09/21/19 0437  WBC 12.3*  --  15.3*  --  18.9*  NEUTROABS  --   --  13.7*  --  17.6*  HGB 7.0*   < > 7.8* 6.6* 8.3*  HCT 23.9*   < > 26.5* 22.7* 27.8*  MCV 90.5  --  91.1  --  90.0  PLT 314  --  337  --  320   < > = values in this interval not displayed.   Blood Culture    Component Value Date/Time   SDES BLOOD RIGHT HAND 12/06/2017 1447   SPECREQUEST  12/06/2017 1447    BOTTLES DRAWN AEROBIC AND ANAEROBIC Blood Culture adequate volume   CULT  12/06/2017 1447    NO GROWTH 5 DAYS Performed at [facility], [place] [address] [place], [place] [address]    REPTSTATUS 12/11/2017 FINAL 12/06/2017 1447    Cardiac Enzymes: No results for input(s): CKTOTAL, CKMB, CKMBINDEX, TROPONINI in the last 168  hours. CBG: Recent Labs  Lab 09/20/19 2056 09/21/19 0020 09/21/19 0350 09/21/19 0435 09/21/19 0546  GLUCAP 88 79 45* 132* 92   Iron Studies: No results for input(s): IRON, TIBC, TRANSFERRIN, FERRITIN in the last 72 hours. @lablastinr3 @ Studies/Results: DG Abdomen 1 View  Result Date: 09/20/2019 CLINICAL DATA:  Abdominal pain, nausea, and vomiting for 1 week EXAM: ABDOMEN - 1 VIEW COMPARISON:  06/02/2014 FINDINGS: Brachytherapy seed implants in prostate bed. Scattered atherosclerotic calcifications aorta and iliac arteries as well as renal arteries and splenic artery. Multilevel degenerative disc and facet disease changes of lumbar spine with dextroconvex scoliosis. Nonobstructive bowel gas pattern. No bowel dilatation or bowel wall thickening. Slight gaseous distention of stomach. IMPRESSION: Slight gaseous distention of stomach. Nonobstructive bowel gas pattern. Electronically Signed   By: Lavonia Dana M.D.   On: 09/20/2019 13:12   CT Abdomen Pelvis W Contrast  Result Date: 09/20/2019 CLINICAL DATA:  Diffuse abdominal pain EXAM: CT ABDOMEN AND PELVIS WITH CONTRAST TECHNIQUE: Multidetector CT imaging of the abdomen and pelvis was performed using the standard protocol following bolus administration of intravenous contrast. CONTRAST:  151mL  OMNIPAQUE IOHEXOL 300 MG/ML  SOLN COMPARISON:  12/16/2017 FINDINGS: Lower chest: Mild emphysematous changes as well as bibasilar atelectasis. Hepatobiliary: Gallbladder has been surgically removed. Prominence of the biliary tree is noted. This is within normal limits for the post cholecystectomy state but has increased in the interval from the prior exam. Correlation with lab values is recommended. No hepatic mass is seen. Pancreas: Pancreas is well visualized within normal enhancement pattern. No areas of pancreatic necrosis are noted. Very mild inflammatory changes are noted surrounding the pancreas particularly in the region of the pancreatic head. Spleen:  Normal in size without focal abnormality. Adrenals/Urinary Tract: Adrenal glands are within normal limits. Multiple cysts are noted throughout both kidneys similar to that seen on prior exam. A few tiny nonobstructing renal calculi are noted bilaterally. The ureters are within normal limits. The bladder is decompressed. Bladder wall thickening is noted and stable from the prior study. Stomach/Bowel: Colon shows no obstructive or inflammatory changes. The stomach is fluid filled. There are inflammatory changes surrounding the proximal duodenum as well as what appears to be a small air-fluid collection although this likely represents a duodenal diverticulum posteriorly as this was seen on the prior study. The possibility of focal duodenal diverticulitis deserves consideration as well. The appendix is within normal limits. Vascular/Lymphatic: Aortic atherosclerosis. Mild dilatation of the infrarenal aorta is noted to 3.1 Cm. Changes of chronic dissection are seen and stable. No enlarged abdominal or pelvic lymph nodes. Reproductive: Prostate therapy seeds are noted. Other: No abdominal wall hernia or abnormality. No abdominopelvic ascites. Musculoskeletal: Degenerative changes of lumbar spine are seen. IMPRESSION: Mild inflammatory changes surrounding the pancreas consistent with early pancreatitis. No pancreatic necrosis is identified at this time. This likely contributes to increased dilatation of the biliary tree from local edema. Inflammatory changes are noted surrounding the second portion of the duodenum with what appears to be an inflamed duodenal diverticulum posteriorly. These changes are likely secondary to the known pancreatitis. Stable aortic aneurysm measuring 3.1 cm. Electronically Signed   By: Inez Catalina M.D.   On: 09/20/2019 15:00   Medications:  . [START ON 09/22/2019] calcitRIOL  1 mcg Oral Q M,W,F-HD  . Chlorhexidine Gluconate Cloth  6 each Topical Q0600  . darbepoetin (ARANESP) injection -  DIALYSIS  60 mcg Intravenous Q Thu-HD  . insulin aspart  0-6 Units Subcutaneous Q4H  . metoprolol tartrate  5 mg Intravenous Q8H  . pantoprazole (PROTONIX) IV  40 mg Intravenous Q12H    Dialysis Orders: MWF, [facility], 4 hours, 2K/2.5 calcium, BFR 450, DFR AF 2.0, EDW 83 kg, UF profile #2, left radiocephalic fistula, 15 G needles, no heparin, calcitriol 1 mcg 3 times weekly, Mircera 225 mcg IV every 2 weeks, Benadryl 50 mg IV q. HD as needed,  Assessment/Plan: 1.  Acute pancreatitis: No preceding alcohol use and appears to be idiopathic.  Ongoing symptomatic management with bowel rest. 2.  End-stage renal disease: With missed hemodialysis treatment on Monday/Wednesday of this week.  HD today off schedule to make up, plan next tomorrow to resume outpt schedule.   3.  Hypertension: Likely worsened by volume status, resume oral antihypertensive therapy and monitor with hemodialysis/ultrafiltration today. 4.  Anemia: With underlying chronic kidney disease, agree with monitoring serial H&H for possible decline associated with hemorrhagic pancreatitis.  Order for ESA with dialysis today. 5.  Secondary hyperparathyroidism: Will resume calcitriol with dialysis and resume binders when back on renal diet.  Jannifer Hick MD 09/21/2019, 8:22 AM  [facility] Pager: [phone]   

## 2019-09-21 NOTE — Progress Notes (Signed)
RECEIVED CRITICAL LAB OF HGB 5.9. NOTIFIED MD RECEIVED NEW ORDER TO REPEAT HGB/HCT STAT. PATIENT IS STABLE AT THIS TIME NO S/S OF DISTRESS. WILL CONTINUE TO MONITOR.

## 2019-09-21 NOTE — ED Notes (Signed)
Paged [facility] to RN [place]--Jan Olano

## 2019-09-21 NOTE — Progress Notes (Signed)
PROGRESS NOTE    Sean Best  YPP:[id number] DOB: 02/04/1934 DOA: 09/20/2019 PCP: Seward Carol, MD     Brief Narrative:  Sean Best is a 86 y.o. male BM PMHx CVA, ESRD on HD M/W/F, HTN, s/p permanent cardiac pacemaker, HLD, prostate cancer, umbilical hernia.  Presents with chief complaint of abdominal pain, nausea, vomiting and diarrhea since Friday. Patient explains the pain started onFriday after his dialysis treatment. He hasfelt nauseated and epigastric pain that now has turned into diffuse abdominal pain. Describes the pain as a constant throbbing pain denies alleviating factors and admits that food tends to make the pain worse. He admits to vomiting multiple times, denies seeing any blood in his vomit. Patient admits that he is has had frequent diarrhea without noticing blood or dark tarry stools. Patient is a known diabetic, has no history of gallstones or pancreatitis. Patient has past surgical history ofcholeyectomy and hernia repair. Patient has a medical history of arthritis, end-stage renal disease on dialysis, hyperlipidemia, hypertension, type 2 diabetes,. Patient denies headache, congestion, sore throat, chest pain, shortness of breath, dysuria, leg pain.   Subjective: 7/22 A/O x4, negative CP, negative S OB, positive abdominal pain secondary to eating crackers and orange juice while in HD.   Assessment & Plan: Covid vaccination;   Principal Problem:   Acute pancreatitis Active Problems:   HTN (hypertension)   Heart block   Cardiac pacemaker in situ ([employer] DDD)   End-stage renal disease on hemodialysis ([place])   HLD (hyperlipidemia)   Prostate cancer ([place])   Umbilical hernia   History of noncompliance with medical treatment   Diabetes mellitus type 2, uncontrolled, with complications ([place])   Acute GI bleeding   Acute pancreatitis -Appears to be idiopathic.  Patient states negative consumption of EtOH -7/21 EtOH negative -Urine tox screen;  patient makes no urine at all.  -7/22 normal saline 75 ml/hr. monitor for fluid overload. Recent Labs  Lab 09/20/19 0435 09/21/19 0437  LIPASE 3,355* 1,374*  -Per GI may start sips of clear liquid. -Per GI may require EGD/EUS once patient's pancreas has defervesced. -Dilaudid PRN abdominal pain  Essential HTN -Hydralazine IV PRN -Metoprolol IV  5 mg TID  Heart block s/p cardiac pacemaker -Stable  ESRD on HD M/W/F -Per nephrology will perform HD in the a.m.  DM type II controlled with complications -4/58 hemoglobin A1c= 4.2 -Very sensitive SSI  HLD -7/21 LDL= 69  Noncompliance with medical treatment -Per patient secondary to significant diarrhea is why he missed his HD on Monday.  Umbilical hernia -Reducible no indication of entrapment  Acute GI bleed (baseline Hg 9.5) -7/21 heme positive -ED contacted GI who will see patient in a.m. -7/22 DC H/H q 6 hr hemoglobin stabilized -Do not want to start Protonix drip outpatient secondary to fluid load and not receiving HD until a.m. -Protonix IV 40 mg BID -Transfuse for hemoglobin<7 -7/21 transfuse 1 unit PRBC Recent Labs  Lab 09/20/19 0435 09/20/19 1310 09/20/19 1835 09/21/19 0437 09/21/19 1308  HGB 7.0* 7.8* 6.6* 8.3* 8.5*  -Stable  Prostate cancer -Review of patient's chart does not appear to have anything recent.  We will have to inquire of  patient in the a.m.     DVT prophylaxis: SCD Code Status: Full Family Communication:  Status is: Inpatient    Dispo: The patient is from: Home              Anticipated d/c is to: Home  Anticipated d/c date is: 8/2              Patient currently unstable      Consultants:  [facility] Dr. Arta Silence [facility]phrology   Procedures/Significant Events:    I have personally reviewed and interpreted all radiology studies and my findings are as above.  VENTILATOR SETTINGS:    Cultures   Antimicrobials:    Devices    LINES /  TUBES:      Continuous Infusions: . sodium chloride       Objective: Vitals:   09/21/19 0900 09/21/19 0930 09/21/19 1000 09/21/19 1030  BP: (!) 145/73 138/72 (!) 147/79 (!) 138/59  Pulse: 77 78 73 75  Resp: 20 19 19 18   Temp:      TempSrc:      SpO2:      Weight:        Intake/Output Summary (Last 24 hours) at 09/21/2019 1236 Last data filed at 09/21/2019 0114 Gross per 24 hour  Intake 1124.17 ml  Output --  Net 1124.17 ml   Filed Weights   09/21/19 0649  Weight: 86.6 kg    Examination:  General: A/O x4, No acute respiratory distress Eyes: negative scleral hemorrhage, negative anisocoria, negative icterus ENT: Negative Runny nose, negative gingival bleeding, Neck:  Negative scars, masses, torticollis, lymphadenopathy, JVD Lungs: Clear to auscultation bilaterally without wheezes or crackles Cardiovascular: Regular rate and rhythm without murmur gallop or rub normal S1 and S2 Abdomen: Positive abdominal pain, nondistended, hypoactive  bowel sounds, no rebound, no ascites, no appreciable mass Extremities: No significant cyanosis, clubbing, or edema bilateral lower extremities Skin: Negative rashes, lesions, ulcers Psychiatric:  Negative depression, negative anxiety, negative fatigue, negative mania  Central nervous system:  Cranial nerves II through XII intact, tongue/uvula midline, all extremities muscle strength 5/5, sensation intact throughout, negative dysarthria, negative expressive aphasia, negative receptive aphasia.  .     Data Reviewed: Care during the described time interval was provided by me .  I have reviewed this patient's available data, including medical history, events of note, physical examination, and all test results as part of my evaluation.  CBC: Recent Labs  Lab 09/20/19 0435 09/20/19 1310 09/20/19 1835 09/21/19 0437  WBC 12.3* 15.3*  --  18.9*  NEUTROABS  --  13.7*  --  17.6*  HGB 7.0* 7.8* 6.6* 8.3*  HCT 23.9* 26.5* 22.7* 27.8*  MCV  90.5 91.1  --  90.0  PLT 314 337  --  644   Basic Metabolic Panel: Recent Labs  Lab 09/20/19 0435 09/21/19 0437  NA 142 138  K 3.8 4.6  CL 93* 95*  CO2 33* 29  GLUCOSE 107* 150*  BUN 40* 48*  CREATININE 10.11* 10.61*  CALCIUM 8.1* 7.8*  MG  --  3.3*  PHOS  --  6.1*   GFR: CrCl cannot be calculated (Unknown ideal weight.). Liver Function Tests: Recent Labs  Lab 09/20/19 0435 09/21/19 0437  AST 100* 75*  ALT 165* 144*  ALKPHOS 312* 335*  BILITOT 2.0* 3.3*  PROT 6.0* 6.0*  ALBUMIN 2.9* 2.8*   Recent Labs  Lab 09/20/19 0435 09/21/19 0437  LIPASE 3,355* 1,374*   No results for input(s): AMMONIA in the last 168 hours. Coagulation Profile: No results for input(s): INR, PROTIME in the last 168 hours. Cardiac Enzymes: No results for input(s): CKTOTAL, CKMB, CKMBINDEX, TROPONINI in the last 168 hours. BNP (last 3 results) No results for input(s): PROBNP in the last 8760 hours. HbA1C: Recent Labs  09/20/19 1310  HGBA1C 4.2*   CBG: Recent Labs  Lab 09/20/19 2056 09/21/19 0020 09/21/19 0350 09/21/19 0435 09/21/19 0546  GLUCAP 88 79 45* 132* 92   Lipid Profile: Recent Labs    09/20/19 0435  CHOL 129  HDL 39*  LDLCALC 69  TRIG 103  CHOLHDL 3.3   Thyroid Function Tests: No results for input(s): TSH, T4TOTAL, FREET4, T3FREE, THYROIDAB in the last 72 hours. Anemia Panel: No results for input(s): VITAMINB12, FOLATE, FERRITIN, TIBC, IRON, RETICCTPCT in the last 72 hours. Sepsis Labs: Recent Labs  Lab 09/20/19 1622 09/20/19 1746  LATICACIDVEN 2.3* 1.8    Recent Results (from the past 240 hour(s))  SARS Coronavirus 2 by RT PCR (hospital order, performed in [facility] hospital lab) Nasopharyngeal Nasopharyngeal Swab     Status: None   Collection Time: 09/20/19  2:12 PM   Specimen: Nasopharyngeal Swab  Result Value Ref Range Status   SARS Coronavirus 2 NEGATIVE NEGATIVE Final    Comment: (NOTE) SARS-CoV-2 target nucleic acids are NOT  DETECTED.  The SARS-CoV-2 RNA is generally detectable in upper and lower respiratory specimens during the acute phase of infection. The lowest concentration of SARS-CoV-2 viral copies this assay can detect is 250 copies / mL. A negative result does not preclude SARS-CoV-2 infection and should not be used as the sole basis for treatment or other patient management decisions.  A negative result may occur with improper specimen collection / handling, submission of specimen other than nasopharyngeal swab, presence of viral mutation(s) within the areas targeted by this assay, and inadequate number of viral copies (<250 copies / mL). A negative result must be combined with clinical observations, patient history, and epidemiological information.  Fact Sheet for Patients:   StrictlyIdeas.no  Fact Sheet for Healthcare Providers: BankingDealers.co.za  This test is not yet approved or  cleared by the [place] FDA and has been authorized for detection and/or diagnosis of SARS-CoV-2 by FDA under an Emergency Use Authorization (EUA).  This EUA will remain in effect (meaning this test can be used) for the duration of the COVID-19 declaration under Section 564(b)(1) of the Act, 21 U.S.C. section 360bbb-3(b)(1), unless the authorization is terminated or revoked sooner.  Performed at [facility], [place] [address] [place], [place] [address]          Radiology Studies: DG Abdomen 1 View  Result Date: 09/20/2019 CLINICAL DATA:  Abdominal pain, nausea, and vomiting for 1 week EXAM: ABDOMEN - 1 VIEW COMPARISON:  06/02/2014 FINDINGS: Brachytherapy seed implants in prostate bed. Scattered atherosclerotic calcifications aorta and iliac arteries as well as renal arteries and splenic artery. Multilevel degenerative disc and facet disease changes of lumbar spine with dextroconvex scoliosis. Nonobstructive bowel gas pattern. No bowel dilatation or  bowel wall thickening. Slight gaseous distention of stomach. IMPRESSION: Slight gaseous distention of stomach. Nonobstructive bowel gas pattern. Electronically Signed   By: Lavonia Dana M.D.   On: 09/20/2019 13:12   CT Abdomen Pelvis W Contrast  Result Date: 09/20/2019 CLINICAL DATA:  Diffuse abdominal pain EXAM: CT ABDOMEN AND PELVIS WITH CONTRAST TECHNIQUE: Multidetector CT imaging of the abdomen and pelvis was performed using the standard protocol following bolus administration of intravenous contrast. CONTRAST:  126mL OMNIPAQUE IOHEXOL 300 MG/ML  SOLN COMPARISON:  12/16/2017 FINDINGS: Lower chest: Mild emphysematous changes as well as bibasilar atelectasis. Hepatobiliary: Gallbladder has been surgically removed. Prominence of the biliary tree is noted. This is within normal limits for the post cholecystectomy state but has increased  in the interval from the prior exam. Correlation with lab values is recommended. No hepatic mass is seen. Pancreas: Pancreas is well visualized within normal enhancement pattern. No areas of pancreatic necrosis are noted. Very mild inflammatory changes are noted surrounding the pancreas particularly in the region of the pancreatic head. Spleen: Normal in size without focal abnormality. Adrenals/Urinary Tract: Adrenal glands are within normal limits. Multiple cysts are noted throughout both kidneys similar to that seen on prior exam. A few tiny nonobstructing renal calculi are noted bilaterally. The ureters are within normal limits. The bladder is decompressed. Bladder wall thickening is noted and stable from the prior study. Stomach/Bowel: Colon shows no obstructive or inflammatory changes. The stomach is fluid filled. There are inflammatory changes surrounding the proximal duodenum as well as what appears to be a small air-fluid collection although this likely represents a duodenal diverticulum posteriorly as this was seen on the prior study. The possibility of focal duodenal  diverticulitis deserves consideration as well. The appendix is within normal limits. Vascular/Lymphatic: Aortic atherosclerosis. Mild dilatation of the infrarenal aorta is noted to 3.1 Cm. Changes of chronic dissection are seen and stable. No enlarged abdominal or pelvic lymph nodes. Reproductive: Prostate therapy seeds are noted. Other: No abdominal wall hernia or abnormality. No abdominopelvic ascites. Musculoskeletal: Degenerative changes of lumbar spine are seen. IMPRESSION: Mild inflammatory changes surrounding the pancreas consistent with early pancreatitis. No pancreatic necrosis is identified at this time. This likely contributes to increased dilatation of the biliary tree from local edema. Inflammatory changes are noted surrounding the second portion of the duodenum with what appears to be an inflamed duodenal diverticulum posteriorly. These changes are likely secondary to the known pancreatitis. Stable aortic aneurysm measuring 3.1 cm. Electronically Signed   By: Inez Catalina M.D.   On: 09/20/2019 15:00        Scheduled Meds: . [START ON 09/22/2019] calcitRIOL  1 mcg Oral Q M,W,F-HD  . Chlorhexidine Gluconate Cloth  6 each Topical Q0600  . darbepoetin (ARANESP) injection - DIALYSIS  60 mcg Intravenous Q Thu-HD  . insulin aspart  0-6 Units Subcutaneous Q4H  . metoprolol tartrate  5 mg Intravenous Q8H  . pantoprazole (PROTONIX) IV  40 mg Intravenous Q12H   Continuous Infusions: . sodium chloride       LOS: 1 day    Time spent:40 min    Talor Cheema, Geraldo Docker, MD [facility] Pager [phone]  If 7PM-7AM, please contact night-coverage [email or web address] Password [facility] 09/21/2019, 12:36 PM

## 2019-09-21 NOTE — Consult Note (Signed)
Referring Provider: ED Primary Care Physician:  Seward Carol, MD Primary Gastroenterologist:  [facility]  Reason for Consultation:  Acute pancreatitis  HPI: Sean Best is a 86 y.o. male with history of ESRD (on HD), permanent pacemaker, and prostate cancer presenting for consultation of pancreatitis.  Patient reports epigastric abdominal pain starting Friday 7/16 during hemodialysis.  He then proceeded to have nausea with an episode of black emesis.  He has continued to have abdominal pain and thus presented to the ED.  Patient states his pain feels similar to when he had his gallbladder pain prior to his cholecystectomy.  He also reports that he has been having loose, black stools for several weeks.  Of note, patient is on iron supplementation.  Denies any hematochezia.  Denies recent GERD, dysphagia, changes in appetite, unexplained weight loss.  Denies any alcohol, tobacco, or IV drug use.  Patient is on 81 mg aspirin daily but denies blood thinner use.  Denies NSAID use.  Denies any family history of pancreatitis, colon cancer, or gastrointestinal emergencies.  Last colonoscopy in 2011 and revealed one benign, hyperplastic polyp but was otherwise unremarkable.  Past Medical History:  Diagnosis Date  . Anemia   . Arthritis    "knees" (04/11/2016)  . Blind left eye    S/P trauma  . Chronic total occlusion of artery of the extremities (HCC)    pt not aware of this  . Claudication ([place])   . ESRD on dialysis [facility])    "MWF; [name]" ((04/10/2016)  . ESRD on peritoneal dialysis [facility])    Started dialysis around April 2015 per son.  Has been doing peritoneal dialysis at home.     . Gout   . Hyperlipidemia   . Hypertension   . Overweight(278.02)   . Presence of permanent cardiac pacemaker    medtronic  . Prostate cancer ([place]) 1990s  . Shingles   . Stroke [facility]) ~1998   denies residual on 04/11/2016  . Type II diabetes mellitus (HCC)    diet controlled  . Umbilical hernia  10/09/9831    Past Surgical History:  Procedure Laterality Date  . A/V FISTULAGRAM N/A 11/01/2018   Procedure: A/V FISTULAGRAM;  Surgeon: Serafina Mitchell, MD;  Location: [place] [facility];  Service: Cardiovascular;  Laterality: N/A;  . AV FISTULA PLACEMENT, RADIOCEPHALIC  82/50/5397   Left arm  . CAPD INSERTION N/A 03/06/2013   Procedure: LAPAROSCOPIC INSERTION CONTINUOUS AMBULATORY PERITONEAL DIALYSIS  (CAPD) CATHETER;  Surgeon: Edward Jolly, MD;  Location: [place];  Service: General;  Laterality: N/A;  . CATARACT EXTRACTION Right   . CHOLECYSTECTOMY  12/02/2017   LAPROSCOPIC  . CORONARY STENT INTERVENTION N/A 02/11/2018   Procedure: CORONARY STENT INTERVENTION;  Surgeon: Sherren Mocha, MD;  Location: [place] [facility];  Service: Cardiovascular;  Laterality: N/A;  . EYE SURGERY Left 1970s   for eye injury  . HERNIA REPAIR    . INSERTION PROSTATE RADIATION SEED    . LAPAROSCOPIC CHOLECYSTECTOMY SINGLE SITE WITH INTRAOPERATIVE CHOLANGIOGRAM N/A 12/02/2017   Procedure: LAPAROSCOPIC CHOLECYSTECTOMY;  Surgeon: Michael Boston, MD;  Location: [place];  Service: General;  Laterality: N/A;  . LEFT HEART CATH AND CORONARY ANGIOGRAPHY N/A 02/11/2018   Procedure: LEFT HEART CATH AND CORONARY ANGIOGRAPHY;  Surgeon: Sherren Mocha, MD;  Location: [place] [facility];  Service: Cardiovascular;  Laterality: N/A;  . PACEMAKER IMPLANT N/A 04/20/2017   Procedure: PACEMAKER IMPLANT;  Surgeon: Evans Lance, MD;  Location: [place] [facility];  Service:  Cardiovascular;  Laterality: N/A;  . PERIPHERAL VASCULAR BALLOON ANGIOPLASTY  11/01/2018   Procedure: PERIPHERAL VASCULAR BALLOON ANGIOPLASTY;  Surgeon: Serafina Mitchell, MD;  Location: [place] [facility];  Service: Cardiovascular;;  Lt lower arm fistula   . UMBILICAL HERNIA REPAIR N/A 03/06/2013   Procedure: HERNIA REPAIR UMBILICAL WITH MESH;  Surgeon: Edward Jolly, MD;  Location: [facility];  Service: General;  Laterality: N/A;    Prior to Admission  medications   Medication Sig Start Date End Date Taking? Authorizing Provider  acetaminophen (TYLENOL) 500 MG tablet Take 1,000 mg by mouth every 6 (six) hours as needed for mild pain or headache.   Yes [provider]  allopurinol (ZYLOPRIM) 100 MG tablet Take 100 mg by mouth daily.     Yes [provider]  amLODipine (NORVASC) 5 MG tablet Take 5 mg by mouth daily. 08/18/19  Yes [provider]  aspirin 81 MG chewable tablet Chew 1 tablet (81 mg total) by mouth daily. 02/14/18  Yes Oretha Milch D, MD  atenolol (TENORMIN) 25 MG tablet Take one tablet by mouth daily on NON HEMODIALYSIS days only Patient taking differently: Take 25 mg by mouth daily.  07/12/18  Yes Evans Lance, MD  atorvastatin (LIPITOR) 40 MG tablet Take 40 mg by mouth every evening.  07/07/17  Yes [provider]  calcium acetate (PHOSLO) 667 MG capsule Take 3 capsules (2,001 mg total) by mouth 3 (three) times daily with meals. 04/30/18  Yes Nita Sells, MD  cinacalcet (SENSIPAR) 90 MG tablet Take 90 mg by mouth every Monday, Wednesday, and Friday.    Yes [provider]  ferrous sulfate 325 (65 FE) MG EC tablet Take 325 mg by mouth daily with breakfast.    Yes [provider]  gabapentin (NEURONTIN) 100 MG capsule Take 100 mg by mouth at bedtime. 12/27/16  Yes [provider]  hydrALAZINE (APRESOLINE) 50 MG tablet Take 1 tablet (50 mg total) by mouth 2 (two) times daily. 10/18/18 09/20/19 Yes Arrien, Jimmy Picket, MD  isosorbide mononitrate (IMDUR) 30 MG 24 hr tablet Take 1 tablet (30 mg total) by mouth daily. Patient taking differently: Take 30 mg by mouth 2 (two) times daily. Taking on Cain Saupe, Sat and Sunday 02/14/18  Yes Oretha Milch D, MD  lidocaine-prilocaine (EMLA) cream Apply 1 application topically as needed (for port access).  10/28/17  Yes [provider]  loperamide (IMODIUM) 2 MG capsule Take 1 capsule (2 mg total) by mouth every 6  (six) hours as needed for diarrhea or loose stools. 10/18/18  Yes Arrien, Jimmy Picket, MD  multivitamin (RENA-VIT) TABS tablet Take 1 tablet by mouth 2 (two) times daily.    Yes [provider]  nitroGLYCERIN (NITROSTAT) 0.4 MG SL tablet Place 1 tablet (0.4 mg total) under the tongue every 5 (five) minutes as needed for chest pain. 04/18/19  Yes Leonie Man, MD  ondansetron (ZOFRAN) 8 MG tablet Take 8 mg by mouth 3 (three) times daily. 09/19/19  Yes [provider]  pantoprazole (PROTONIX) 40 MG tablet Take 40 mg by mouth daily.   Yes [provider]  traMADol (ULTRAM) 50 MG tablet Take 50 mg by mouth 2 (two) times daily as needed for pain. 08/01/19  Yes [provider]  VELPHORO 500 MG chewable tablet Chew 500 mg by mouth 3 (three) times daily with meals.  01/28/18  Yes [provider]  ticagrelor (BRILINTA) 90 MG TABS tablet Take 1 tablet (90 mg  total) by mouth 2 (two) times daily. Patient not taking: Reported on 09/20/2019 02/13/18   Oretha Milch D, MD    Scheduled Meds: . Derrill Memo ON 09/22/2019] calcitRIOL  1 mcg Oral Q M,W,F-HD  . Chlorhexidine Gluconate Cloth  6 each Topical Q0600  . darbepoetin (ARANESP) injection - DIALYSIS  60 mcg Intravenous Q Thu-HD  . insulin aspart  0-6 Units Subcutaneous Q4H  . metoprolol tartrate  5 mg Intravenous Q8H  . pantoprazole (PROTONIX) IV  40 mg Intravenous Q12H   Continuous Infusions: PRN Meds:.hydrALAZINE, HYDROmorphone (DILAUDID) injection  Allergies as of 09/20/2019 - Review Complete 09/20/2019  Allergen Reaction Noted  . Cardura [doxazosin mesylate] Other (See Comments) 03/11/2013  . Tape Dermatitis 03/14/2018    Family History  Problem Relation Age of Onset  . Cancer Mother   . Heart disease Mother   . Cancer Father     Social History   Socioeconomic History  . Marital status: Widowed    Spouse name: Not on file  . Number of children: Not on file  . Years of education: Not on file   . Highest education level: Not on file  Occupational History  . Occupation: Retired  Tobacco Use  . Smoking status: Never Smoker  . Smokeless tobacco: Never Used  Vaping Use  . Vaping Use: Never used  Substance and Sexual Activity  . Alcohol use: No  . Drug use: No  . Sexual activity: Not Currently  Other Topics Concern  . Not on file  Social History Narrative   Patient lives with his daughter.   Social Determinants of Health   Financial Resource Strain:   . Difficulty of Paying Living Expenses:   Food Insecurity:   . Worried About Charity fundraiser in the Last Year:   . Arboriculturist in the Last Year:   Transportation Needs:   . Film/video editor (Medical):   Marland Kitchen Lack of Transportation (Non-Medical):   Physical Activity:   . Days of Exercise per Week:   . Minutes of Exercise per Session:   Stress:   . Feeling of Stress :   Social Connections:   . Frequency of Communication with Friends and Family:   . Frequency of Social Gatherings with Friends and Family:   . Attends Religious Services:   . Active Member of Clubs or Organizations:   . Attends Archivist Meetings:   Marland Kitchen Marital Status:   Intimate Partner Violence:   . Fear of Current or Ex-Partner:   . Emotionally Abused:   Marland Kitchen Physically Abused:   . Sexually Abused:     Review of Systems: Review of Systems  Constitutional: Positive for malaise/fatigue. Negative for chills, fever and weight loss.  HENT: Negative for hearing loss and tinnitus.   Eyes: Negative for pain and redness.  Respiratory: Negative for cough and shortness of breath.   Cardiovascular: Negative for chest pain and palpitations.  Gastrointestinal: Positive for abdominal pain, diarrhea, melena, nausea and vomiting. Negative for blood in stool, constipation and heartburn.  Genitourinary: Negative for flank pain and hematuria.  Musculoskeletal: Negative for falls and joint pain.  Skin: Negative for itching and rash.  Neurological:  Negative for seizures and loss of consciousness.  Endo/Heme/Allergies: Negative for polydipsia. Does not bruise/bleed easily.  Psychiatric/Behavioral: Negative for substance abuse. The patient is not nervous/anxious.      Physical Exam: Vital signs: Vitals:   09/21/19 1000 09/21/19 1030  BP: (!) 147/79 (!) 138/59  Pulse: 73 75  Resp: 19 18  Temp:    SpO2:       Physical Exam Constitutional:      General: He is not in acute distress.    Appearance: He is well-developed. He is obese.  HENT:     Head: Normocephalic and atraumatic.     Nose: Nose normal.     Mouth/Throat:     Mouth: Mucous membranes are moist.     Pharynx: Oropharynx is clear.  Eyes:     General: Scleral icterus present.     Extraocular Movements: Extraocular movements intact.  Cardiovascular:     Rate and Rhythm: Normal rate and regular rhythm.     Heart sounds: Normal heart sounds.  Pulmonary:     Effort: Pulmonary effort is normal. No respiratory distress.     Breath sounds: Normal breath sounds.  Abdominal:     General: Abdomen is protuberant. Bowel sounds are normal. There is no distension.     Palpations: Abdomen is soft. There is no mass.     Tenderness: There is abdominal tenderness (moderate, epigastric). There is guarding. There is no right CVA tenderness, left CVA tenderness or rebound.     Hernia: A hernia (umbilical) is present.  Musculoskeletal:        General: No tenderness.     Cervical back: Normal range of motion and neck supple.     Right lower leg: Edema present.     Left lower leg: Edema present.  Skin:    General: Skin is warm and dry.     Coloration: Skin is not jaundiced.  Neurological:     General: No focal deficit present.     Mental Status: He is alert and oriented to person, place, and time.  Psychiatric:        Mood and Affect: Mood normal.        Behavior: Behavior normal.     GI:  Lab Results: Recent Labs    09/20/19 0435 09/20/19 0435 09/20/19 1310  09/20/19 1835 09/21/19 0437  WBC 12.3*  --  15.3*  --  18.9*  HGB 7.0*   < > 7.8* 6.6* 8.3*  HCT 23.9*   < > 26.5* 22.7* 27.8*  PLT 314  --  337  --  320   < > = values in this interval not displayed.   BMET Recent Labs    09/20/19 0435 09/21/19 0437  NA 142 138  K 3.8 4.6  CL 93* 95*  CO2 33* 29  GLUCOSE 107* 150*  BUN 40* 48*  CREATININE 10.11* 10.61*  CALCIUM 8.1* 7.8*   LFT Recent Labs    09/21/19 0437  PROT 6.0*  ALBUMIN 2.8*  AST 75*  ALT 144*  ALKPHOS 335*  BILITOT 3.3*   PT/INR No results for input(s): LABPROT, INR in the last 72 hours.   Studies/Results: DG Abdomen 1 View  Result Date: 09/20/2019 CLINICAL DATA:  Abdominal pain, nausea, and vomiting for 1 week EXAM: ABDOMEN - 1 VIEW COMPARISON:  06/02/2014 FINDINGS: Brachytherapy seed implants in prostate bed. Scattered atherosclerotic calcifications aorta and iliac arteries as well as renal arteries and splenic artery. Multilevel degenerative disc and facet disease changes of lumbar spine with dextroconvex scoliosis. Nonobstructive bowel gas pattern. No bowel dilatation or bowel wall thickening. Slight gaseous distention of stomach. IMPRESSION: Slight gaseous distention of stomach. Nonobstructive bowel gas pattern. Electronically Signed   By: Lavonia Dana M.D.   On: 09/20/2019 13:12   CT Abdomen Pelvis W Contrast  Result  Date: 09/20/2019 CLINICAL DATA:  Diffuse abdominal pain EXAM: CT ABDOMEN AND PELVIS WITH CONTRAST TECHNIQUE: Multidetector CT imaging of the abdomen and pelvis was performed using the standard protocol following bolus administration of intravenous contrast. CONTRAST:  186mL OMNIPAQUE IOHEXOL 300 MG/ML  SOLN COMPARISON:  12/16/2017 FINDINGS: Lower chest: Mild emphysematous changes as well as bibasilar atelectasis. Hepatobiliary: Gallbladder has been surgically removed. Prominence of the biliary tree is noted. This is within normal limits for the post cholecystectomy state but has increased in the  interval from the prior exam. Correlation with lab values is recommended. No hepatic mass is seen. Pancreas: Pancreas is well visualized within normal enhancement pattern. No areas of pancreatic necrosis are noted. Very mild inflammatory changes are noted surrounding the pancreas particularly in the region of the pancreatic head. Spleen: Normal in size without focal abnormality. Adrenals/Urinary Tract: Adrenal glands are within normal limits. Multiple cysts are noted throughout both kidneys similar to that seen on prior exam. A few tiny nonobstructing renal calculi are noted bilaterally. The ureters are within normal limits. The bladder is decompressed. Bladder wall thickening is noted and stable from the prior study. Stomach/Bowel: Colon shows no obstructive or inflammatory changes. The stomach is fluid filled. There are inflammatory changes surrounding the proximal duodenum as well as what appears to be a small air-fluid collection although this likely represents a duodenal diverticulum posteriorly as this was seen on the prior study. The possibility of focal duodenal diverticulitis deserves consideration as well. The appendix is within normal limits. Vascular/Lymphatic: Aortic atherosclerosis. Mild dilatation of the infrarenal aorta is noted to 3.1 Cm. Changes of chronic dissection are seen and stable. No enlarged abdominal or pelvic lymph nodes. Reproductive: Prostate therapy seeds are noted. Other: No abdominal wall hernia or abnormality. No abdominopelvic ascites. Musculoskeletal: Degenerative changes of lumbar spine are seen. IMPRESSION: Mild inflammatory changes surrounding the pancreas consistent with early pancreatitis. No pancreatic necrosis is identified at this time. This likely contributes to increased dilatation of the biliary tree from local edema. Inflammatory changes are noted surrounding the second portion of the duodenum with what appears to be an inflamed duodenal diverticulum posteriorly. These  changes are likely secondary to the known pancreatitis. Stable aortic aneurysm measuring 3.1 cm. Electronically Signed   By: Inez Catalina M.D.   On: 09/20/2019 15:00    Impression: Acute pancreatitis, possible biliary origin.  Patient had laparoscopic cholecystectomy for cholecystitis and cholelithiasis in 2019, but it does not appear that an [place] was done at time of surgery.  Patient denies any alcohol use, and lipids are within normal limits. -Today, T bili 3.3/AST 75/ALT 144/ALP 335 with lipase 1374  Possible upper GI bleeding: Melena and an episode of black emesis.  Hemoglobin 8.3 today, improved from 7.0 on arrival after 1u pRCBs. (Baseline Hgb 9.5 as of 03/2019)  ESRD on HD: BUN 48/ Cr 10.61 this morning (pre-HD)  Plan: Initiate 150cc/hr NS infusion for acute pancreatitis.  Unfortunately, patient is not a candidate for MRCP due to permanent pacemaker.  We will not plan to proceed with ERCP (to assess for CBD stones) at this time due to acute pancreatitis.  Recommend trending LFTs and follow clinical course.  For possible upper GI bleeding, continue Protonix 40 mg IV BID.  Continue to monitor H&H with transfusion as needed to maintain Hgb >7.    [facility] will follow.   LOS: 1 day   Salley Slaughter  PA-C 09/21/2019, 11:18 AM  Contact #  [phone]

## 2019-09-21 NOTE — ED Notes (Signed)
Spoke with Dr. Ulyses Southward regarding patient's cbg of 45, verbal order given for 1 amp D50.

## 2019-09-21 NOTE — ED Notes (Addendum)
Patients CBG 45, pt alert and oriented given, graham crackers and juice. Will page admitting provider

## 2019-09-22 LAB — CBC WITH DIFFERENTIAL/PLATELET
Abs Immature Granulocytes: 0.07 10*3/uL (ref 0.00–0.07)
Basophils Absolute: 0 10*3/uL (ref 0.0–0.1)
Basophils Relative: 0 %
Eosinophils Absolute: 0.2 10*3/uL (ref 0.0–0.5)
Eosinophils Relative: 2 %
HCT: 23.5 % — ABNORMAL LOW (ref 39.0–52.0)
Hemoglobin: 7.1 g/dL — ABNORMAL LOW (ref 13.0–17.0)
Immature Granulocytes: 1 %
Lymphocytes Relative: 7 %
Lymphs Abs: 0.7 10*3/uL (ref 0.7–4.0)
MCH: 26.4 pg (ref 26.0–34.0)
MCHC: 30.2 g/dL (ref 30.0–36.0)
MCV: 87.4 fL (ref 80.0–100.0)
Monocytes Absolute: 0.9 10*3/uL (ref 0.1–1.0)
Monocytes Relative: 10 %
Neutro Abs: 7.4 10*3/uL (ref 1.7–7.7)
Neutrophils Relative %: 80 %
Platelets: 263 10*3/uL (ref 150–400)
RBC: 2.69 MIL/uL — ABNORMAL LOW (ref 4.22–5.81)
RDW: 19.6 % — ABNORMAL HIGH (ref 11.5–15.5)
WBC: 9.3 10*3/uL (ref 4.0–10.5)
nRBC: 0 % (ref 0.0–0.2)

## 2019-09-22 LAB — COMPREHENSIVE METABOLIC PANEL
ALT: 99 U/L — ABNORMAL HIGH (ref 0–44)
AST: 40 U/L (ref 15–41)
Albumin: 2.5 g/dL — ABNORMAL LOW (ref 3.5–5.0)
Alkaline Phosphatase: 253 U/L — ABNORMAL HIGH (ref 38–126)
Anion gap: 11 (ref 5–15)
BUN: 28 mg/dL — ABNORMAL HIGH (ref 8–23)
CO2: 27 mmol/L (ref 22–32)
Calcium: 7.9 mg/dL — ABNORMAL LOW (ref 8.9–10.3)
Chloride: 98 mmol/L (ref 98–111)
Creatinine, Ser: 6.07 mg/dL — ABNORMAL HIGH (ref 0.61–1.24)
GFR calc Af Amer: 9 mL/min — ABNORMAL LOW (ref >60)
GFR calc non Af Amer: 8 mL/min — ABNORMAL LOW (ref >60)
Glucose, Bld: 65 mg/dL — ABNORMAL LOW (ref 70–99)
Potassium: 4.3 mmol/L (ref 3.5–5.1)
Sodium: 136 mmol/L (ref 135–145)
Total Bilirubin: 0.9 mg/dL (ref 0.3–1.2)
Total Protein: 5.4 g/dL — ABNORMAL LOW (ref 6.5–8.1)

## 2019-09-22 LAB — GLUCOSE, CAPILLARY
Glucose-Capillary: 104 mg/dL — ABNORMAL HIGH (ref 70–99)
Glucose-Capillary: 107 mg/dL — ABNORMAL HIGH (ref 70–99)
Glucose-Capillary: 40 mg/dL — CL (ref 70–99)
Glucose-Capillary: 41 mg/dL — CL (ref 70–99)
Glucose-Capillary: 68 mg/dL — ABNORMAL LOW (ref 70–99)
Glucose-Capillary: 71 mg/dL (ref 70–99)
Glucose-Capillary: 82 mg/dL (ref 70–99)
Glucose-Capillary: 90 mg/dL (ref 70–99)
Glucose-Capillary: 97 mg/dL (ref 70–99)

## 2019-09-22 LAB — HEMOGLOBIN AND HEMATOCRIT, BLOOD
HCT: 22.5 % — ABNORMAL LOW (ref 39.0–52.0)
Hemoglobin: 6.8 g/dL — CL (ref 13.0–17.0)

## 2019-09-22 LAB — HEPATITIS B SURFACE ANTIBODY, QUANTITATIVE: Hep B S AB Quant (Post): 571.5 m[IU]/mL (ref >9.9)

## 2019-09-22 LAB — MAGNESIUM: Magnesium: 2.7 mg/dL — ABNORMAL HIGH (ref 1.7–2.4)

## 2019-09-22 LAB — PHOSPHORUS: Phosphorus: 4.2 mg/dL (ref 2.5–4.6)

## 2019-09-22 LAB — LIPASE, BLOOD: Lipase: 773 U/L — ABNORMAL HIGH (ref 11–51)

## 2019-09-22 MED ORDER — PANTOPRAZOLE SODIUM 40 MG PO TBEC
40.0000 mg | DELAYED_RELEASE_TABLET | Freq: Every day | ORAL | Status: DC
  Filled 2019-09-22: qty 1

## 2019-09-22 MED ORDER — PANTOPRAZOLE SODIUM 40 MG PO TBEC
40.0000 mg | DELAYED_RELEASE_TABLET | Freq: Two times a day (BID) | ORAL | Status: DC
  Administered 2019-09-22 – 2019-09-26 (×9): 40 mg via ORAL
  Filled 2019-09-22 (×9): qty 1

## 2019-09-22 MED ORDER — CALCITRIOL 0.5 MCG PO CAPS
ORAL_CAPSULE | ORAL | Status: AC
  Administered 2019-09-22: 1 ug via ORAL
  Filled 2019-09-22: qty 2

## 2019-09-22 NOTE — Progress Notes (Signed)
Hypoglycemic Event  CBG: 41  Treatment: 4 oz juice/soda  Symptoms: None  Follow-up CBG: [id number] CBG Result:90  Possible Reasons for Event: Inadequate meal intake  Comments/MD notified:X. Blount,NP notified via text page    Roselind Rily

## 2019-09-22 NOTE — Progress Notes (Signed)
Hypoglycemic Event  CBG: 40  Treatment: 8 oz juice/soda  Symptoms: None  Follow-up CBG: [id number] CBG Result:104  Possible Reasons for Event: Inadequate meal intake  Comments/MD notified:X Blount,NP via text page    Sean Best

## 2019-09-22 NOTE — Progress Notes (Signed)
Subjective: Abdominal pain improving. Is hungry. Tolerating clear liquids.  Objective: Vital signs in last 24 hours: Temp:  [98.2 F (36.8 C)-99.6 F (37.6 C)] 98.9 F (37.2 C) (07/23 1152) Pulse Rate:  [65-87] 82 (07/23 1152) Resp:  [15-18] 18 (07/23 1152) BP: (118-181)/(65-96) 157/78 (07/23 1152) SpO2:  [93 %-100 %] 100 % (07/23 0358) Weight:  [86.1 kg] 86.1 kg (07/23 0735) Weight change: -3 kg Last BM Date: 09/21/19  PE: GEN:  NAD ABD:  Soft, mild tenderness, no peritonitis  Lab Results:  Studies/Results: CBC    Component Value Date/Time   WBC 9.3 09/22/2019 0713   RBC 2.69 (L) 09/22/2019 0713   HGB 7.1 (L) 09/22/2019 0713   HCT 23.5 (L) 09/22/2019 0713   PLT 263 09/22/2019 0713   MCV 87.4 09/22/2019 0713   MCH 26.4 09/22/2019 0713   MCHC 30.2 09/22/2019 0713   RDW 19.6 (H) 09/22/2019 0713   LYMPHSABS 0.7 09/22/2019 0713   MONOABS 0.9 09/22/2019 0713   EOSABS 0.2 09/22/2019 0713   BASOSABS 0.0 09/22/2019 0713   CMP     Component Value Date/Time   NA 136 09/22/2019 0713   K 4.3 09/22/2019 0713   CL 98 09/22/2019 0713   CO2 27 09/22/2019 0713   GLUCOSE 65 (L) 09/22/2019 0713   BUN 28 (H) 09/22/2019 0713   CREATININE 6.07 (H) 09/22/2019 0713   CALCIUM 7.9 (L) 09/22/2019 0713   PROT 5.4 (L) 09/22/2019 0713   ALBUMIN 2.5 (L) 09/22/2019 0713   AST 40 09/22/2019 0713   ALT 99 (H) 09/22/2019 0713   ALKPHOS 253 (H) 09/22/2019 0713   BILITOT 0.9 09/22/2019 0713   GFRNONAA 8 (L) 09/22/2019 0713   GFRAA 9 (L) 09/22/2019 0713   Assessment:  1.  Pancreatitis, suspect biliary (despite cholecystectomy) in light of elevated LFTs and no alcohol history. 2. Drop Hgb with some dark stools; likely multifactorial (dilutional, chronic kidney disease, possibly some GI tract process likely esophagitis/gastritis/ulcer).  Plan:  1.  PPI (Pantoprazole 40 mg po/IV qd, or equivalent). 2.  Advance diet to full liquids. 3.  Medical management of pancreatitis. 4.  EGD/EUS  (with ERCP available under same sedation) early next week or expedited outpatient in 2-3 weeks. 5.  [facility] will follow.   Landry Dyke 09/22/2019, 12:03 PM   Cell [phone] If no answer or after 5 PM call [phone]

## 2019-09-22 NOTE — Social Work (Signed)
CSW secure messaged MD and bedside RN.  Pt daughter, per RN, had called inquiring about home hospice. Pt still full code and undergoing dialysis treatments. Inquired as to if goals of care discussion with palliative may be appropriate. MD aware.   Westley Hummer, MSW, [place] [facility]

## 2019-09-22 NOTE — Progress Notes (Signed)
[facility] Progress Note   Subjective:   Seen on HD. Says nausea, abd issues improving.  Edema is improved after HD yesterday  Objective Vitals:   09/22/19 0830 09/22/19 0900 09/22/19 0930 09/22/19 1000  BP: (!) 151/85 (!) 150/84 (!) 150/80 (!) 164/96  Pulse: 74 74 78 80  Resp:      Temp:      TempSrc:      SpO2:      Weight:       Physical Exam General: elderly man, comfortable Heart: RRR Lungs: clear ant Abdomen: soft, nontender with mod palpation Extremities: trace edema Dialysis Access:  L RC AVF aneurysmal large, Qb 450  Additional Objective Labs: Basic Metabolic Panel: Recent Labs  Lab 09/20/19 0435 09/21/19 0437 09/22/19 0713  NA 142 138 136  K 3.8 4.6 4.3  CL 93* 95* 98  CO2 33* 29 27  GLUCOSE 107* 150* 65*  BUN 40* 48* 28*  CREATININE 10.11* 10.61* 6.07*  CALCIUM 8.1* 7.8* 7.9*  PHOS  --  6.1* 4.2   Liver Function Tests: Recent Labs  Lab 09/20/19 0435 09/21/19 0437 09/22/19 0713  AST 100* 75* 40  ALT 165* 144* 99*  ALKPHOS 312* 335* 253*  BILITOT 2.0* 3.3* 0.9  PROT 6.0* 6.0* 5.4*  ALBUMIN 2.9* 2.8* 2.5*   Recent Labs  Lab 09/20/19 0435 09/21/19 0437 09/22/19 0713  LIPASE 3,355* 1,374* 773*   CBC: Recent Labs  Lab 09/20/19 0435 09/20/19 0435 09/20/19 1310 09/20/19 1835 09/21/19 0437 09/21/19 1308 09/21/19 1801 09/21/19 2305 09/22/19 0713  WBC 12.3*   < > 15.3*  --  18.9*  --   --   --  9.3  NEUTROABS  --   --  13.7*  --  17.6*  --   --   --  7.4  HGB 7.0*   < > 7.8*   < > 8.3*   < > 5.9* 6.8* 7.1*  HCT 23.9*   < > 26.5*   < > 27.8*   < > 19.4* 22.5* 23.5*  MCV 90.5  --  91.1  --  90.0  --   --   --  87.4  PLT 314   < > 337  --  320  --   --   --  263   < > = values in this interval not displayed.   Blood Culture    Component Value Date/Time   SDES BLOOD RIGHT HAND 12/06/2017 1447   SPECREQUEST  12/06/2017 1447    BOTTLES DRAWN AEROBIC AND ANAEROBIC Blood Culture adequate volume   CULT  12/06/2017 1447     NO GROWTH 5 DAYS Performed at [facility], [place] [address] [place], [place] [address]    REPTSTATUS 12/11/2017 FINAL 12/06/2017 1447    Cardiac Enzymes: No results for input(s): CKTOTAL, CKMB, CKMBINDEX, TROPONINI in the last 168 hours. CBG: Recent Labs  Lab 09/22/19 0047 09/22/19 0347 09/22/19 0358 09/22/19 0430 09/22/19 0514  GLUCAP 90 40* 41* 68* 104*   Iron Studies: No results for input(s): IRON, TIBC, TRANSFERRIN, FERRITIN in the last 72 hours. @lablastinr3 @ Studies/Results: DG Abdomen 1 View  Result Date: 09/20/2019 CLINICAL DATA:  Abdominal pain, nausea, and vomiting for 1 week EXAM: ABDOMEN - 1 VIEW COMPARISON:  06/02/2014 FINDINGS: Brachytherapy seed implants in prostate bed. Scattered atherosclerotic calcifications aorta and iliac arteries as well as renal arteries and splenic artery. Multilevel degenerative disc and facet disease changes of lumbar spine with dextroconvex scoliosis. Nonobstructive bowel  gas pattern. No bowel dilatation or bowel wall thickening. Slight gaseous distention of stomach. IMPRESSION: Slight gaseous distention of stomach. Nonobstructive bowel gas pattern. Electronically Signed   By: Lavonia Dana M.D.   On: 09/20/2019 13:12   CT Abdomen Pelvis W Contrast  Result Date: 09/20/2019 CLINICAL DATA:  Diffuse abdominal pain EXAM: CT ABDOMEN AND PELVIS WITH CONTRAST TECHNIQUE: Multidetector CT imaging of the abdomen and pelvis was performed using the standard protocol following bolus administration of intravenous contrast. CONTRAST:  177mL OMNIPAQUE IOHEXOL 300 MG/ML  SOLN COMPARISON:  12/16/2017 FINDINGS: Lower chest: Mild emphysematous changes as well as bibasilar atelectasis. Hepatobiliary: Gallbladder has been surgically removed. Prominence of the biliary tree is noted. This is within normal limits for the post cholecystectomy state but has increased in the interval from the prior exam. Correlation with lab values is recommended. No hepatic mass is  seen. Pancreas: Pancreas is well visualized within normal enhancement pattern. No areas of pancreatic necrosis are noted. Very mild inflammatory changes are noted surrounding the pancreas particularly in the region of the pancreatic head. Spleen: Normal in size without focal abnormality. Adrenals/Urinary Tract: Adrenal glands are within normal limits. Multiple cysts are noted throughout both kidneys similar to that seen on prior exam. A few tiny nonobstructing renal calculi are noted bilaterally. The ureters are within normal limits. The bladder is decompressed. Bladder wall thickening is noted and stable from the prior study. Stomach/Bowel: Colon shows no obstructive or inflammatory changes. The stomach is fluid filled. There are inflammatory changes surrounding the proximal duodenum as well as what appears to be a small air-fluid collection although this likely represents a duodenal diverticulum posteriorly as this was seen on the prior study. The possibility of focal duodenal diverticulitis deserves consideration as well. The appendix is within normal limits. Vascular/Lymphatic: Aortic atherosclerosis. Mild dilatation of the infrarenal aorta is noted to 3.1 Cm. Changes of chronic dissection are seen and stable. No enlarged abdominal or pelvic lymph nodes. Reproductive: Prostate therapy seeds are noted. Other: No abdominal wall hernia or abnormality. No abdominopelvic ascites. Musculoskeletal: Degenerative changes of lumbar spine are seen. IMPRESSION: Mild inflammatory changes surrounding the pancreas consistent with early pancreatitis. No pancreatic necrosis is identified at this time. This likely contributes to increased dilatation of the biliary tree from local edema. Inflammatory changes are noted surrounding the second portion of the duodenum with what appears to be an inflamed duodenal diverticulum posteriorly. These changes are likely secondary to the known pancreatitis. Stable aortic aneurysm measuring 3.1  cm. Electronically Signed   By: Inez Catalina M.D.   On: 09/20/2019 15:00   Medications: . sodium chloride 75 mL/hr at 09/22/19 0000   . calcitRIOL  1 mcg Oral Q M,W,F-HD  . Chlorhexidine Gluconate Cloth  6 each Topical Q0600  . darbepoetin (ARANESP) injection - DIALYSIS  60 mcg Intravenous Q Thu-HD  . insulin aspart  0-6 Units Subcutaneous Q4H  . metoprolol tartrate  5 mg Intravenous Q8H  . pantoprazole (PROTONIX) IV  40 mg Intravenous Q12H    Dialysis Orders: MWF, [facility], 4 hours, 2K/2.5 calcium, BFR 450, DFR AF 2.0, EDW 83 kg, UF profile #2, left radiocephalic fistula, 15 G needles, no heparin, calcitriol 1 mcg 3 times weekly, Mircera 225 mcg IV every 2 weeks, Benadryl 50 mg IV q. HD as needed,  Assessment/Plan: 1.  Acute pancreatitis: No preceding alcohol use and appears to be idiopathic.  Ongoing symptomatic management with bowel rest. 2.  End-stage renal disease: With missed hemodialysis treatment on Monday/Wednesday of  this week.  HD Thurs and now Friday to resume outpt schedule. 3.  Hypertension: Likely worsened by volume status, resume oral antihypertensive therapy and monitor with hemodialysis/ultrafiltration today. 4.  Anemia: Hb 5.8 this morning back on recheck was back to baseline 7.1.  Had ESA on admission.  CTM. 5.  Secondary hyperparathyroidism: Will resume calcitriol with dialysis and resume binders when back on renal diet.  OK for d/c from my perspective when tolerating diet.   Jannifer Hick MD 09/22/2019, 10:41 AM  [facility] Pager: [phone]

## 2019-09-22 NOTE — Progress Notes (Signed)
PROGRESS NOTE    Sean Best  OMV:[id number] DOB: [birth year] DOA: 09/20/2019 PCP: Seward Carol, MD     Brief Narrative:  Sean Best is a 86 y.o. male BM PMHx CVA, ESRD on HD M/W/F, HTN, s/p permanent cardiac pacemaker, HLD, prostate cancer, umbilical hernia.  Presents with chief complaint of abdominal pain, nausea, vomiting and diarrhea since Friday. Patient explains the pain started onFriday after his dialysis treatment. He hasfelt nauseated and epigastric pain that now has turned into diffuse abdominal pain. Describes the pain as a constant throbbing pain denies alleviating factors and admits that food tends to make the pain worse. He admits to vomiting multiple times, denies seeing any blood in his vomit. Patient admits that he is has had frequent diarrhea without noticing blood or dark tarry stools. Patient is a known diabetic, has no history of gallstones or pancreatitis. Patient has past surgical history ofcholeyectomy and hernia repair. Patient has a medical history of arthritis, end-stage renal disease on dialysis, hyperlipidemia, hypertension, type 2 diabetes,. Patient denies headache, congestion, sore throat, chest pain, shortness of breath, dysuria, leg pain.   Subjective: 7/23 A/O x4, negative CP, negative S OB, negative abdominal pain.  Negative postprandial pain.  However does have significant gas and feels as if she needs to use the bathroom.   Assessment & Plan: Covid vaccination;   Principal Problem:   Acute pancreatitis Active Problems:   HTN (hypertension)   Heart block   Cardiac pacemaker in situ ([employer] DDD)   End-stage renal disease on hemodialysis ([place])   HLD (hyperlipidemia)   Prostate cancer ([place])   Umbilical hernia   History of noncompliance with medical treatment   Diabetes mellitus type 2, uncontrolled, with complications ([place])   Acute GI bleeding   Acute pancreatitis -Appears to be idiopathic.  Patient states negative consumption  of EtOH -7/21 EtOH negative -Urine tox screen; patient makes no urine at all.  -7/22 normal saline 75 ml/hr. monitor for fluid overload. Recent Labs  Lab 09/20/19 0435 09/21/19 0437 09/22/19 0713  LIPASE 3,355* 1,374* 773*  -Per GI may start sips of clear liquid. -Per GI may require EGD/EUS once patient's pancreas has defervesced. -Dilaudid PRN abdominal pain -7/23 patient improving daily will try to encourage [facility] to perform EGD/EUS early next week prior to patient's discharge  Essential HTN -Hydralazine IV PRN -Metoprolol IV  5 mg TID  Heart block s/p cardiac pacemaker -Stable  ESRD on HD M/W/F -Per nephrology will perform HD in the a.m.  DM type II controlled with complications -6/28 hemoglobin A1c= 4.2 -Very sensitive SSI  HLD -7/21 LDL= 69  Noncompliance with medical treatment -Per patient secondary to significant diarrhea is why he missed his HD on Monday.  Umbilical hernia -Reducible no indication of entrapment  Acute GI bleed (baseline Hg 9.5) -7/21 heme positive -ED contacted GI who will see patient in a.m. -7/22 DC H/H q 6 hr hemoglobin stabilized -Do not want to start Protonix drip outpatient secondary to fluid load and not receiving HD until a.m. -7/23 change Protonix po 40 mg BID -Transfuse for hemoglobin<7 -7/21 transfuse 1 unit PRBC Recent Labs  Lab 09/21/19 0437 09/21/19 1308 09/21/19 1801 09/21/19 2305 09/22/19 0713  HGB 8.3* 8.5* 5.9* 6.8* 7.1*  -Stable  Prostate cancer -Review of patient's chart does not appear to have anything recent.  We will have to inquire of  patient in the a.m.     DVT prophylaxis: SCD Code Status: Full Family Communication:  Status is: Inpatient  Dispo: The patient is from: Home              Anticipated d/c is to: Home              Anticipated d/c date is: 8/2              Patient currently unstable      Consultants:  [facility] Dr. Arta Silence Nephrology   Procedures/Significant Events:    I have personally reviewed and interpreted all radiology studies and my findings are as above.  VENTILATOR SETTINGS:    Cultures   Antimicrobials:    Devices    LINES / TUBES:      Continuous Infusions: . sodium chloride 75 mL/hr at 09/22/19 1325     Objective: Vitals:   09/22/19 1130 09/22/19 1151 09/22/19 1152 09/22/19 1307  BP: (!) 156/82 (!) 148/87 (!) 157/78 (!) 133/76  Pulse: 82 77 82 78  Resp:   18 18  Temp:   98.9 F (37.2 C) 99.2 F (37.3 C)  TempSrc:   Oral Oral  SpO2:    97%  Weight:   83.1 kg     Intake/Output Summary (Last 24 hours) at 09/22/2019 1423 Last data filed at 09/22/2019 1152 Gross per 24 hour  Intake 225 ml  Output 3463 ml  Net -3238 ml   Filed Weights   09/21/19 1100 09/22/19 0735 09/22/19 1152  Weight: 83.6 kg 86.1 kg 83.1 kg   Physical Exam:  General: A/O x4, No acute respiratory distress Eyes: negative scleral hemorrhage, negative anisocoria, negative icterus ENT: Negative Runny nose, negative gingival bleeding, Neck:  Negative scars, masses, torticollis, lymphadenopathy, JVD Lungs: Clear to auscultation bilaterally without wheezes or crackles Cardiovascular: Regular rate and rhythm without murmur gallop or rub normal S1 and S2 Abdomen: negative abdominal pain, nondistended, positive soft, bowel sounds, no rebound, no ascites, no appreciable mass Extremities: No significant cyanosis, clubbing, or edema bilateral lower extremities Skin: Negative rashes, lesions, ulcers Psychiatric:  Negative depression, negative anxiety, negative fatigue, negative mania  Central nervous system:  Cranial nerves II through XII intact, tongue/uvula midline, all extremities muscle strength 5/5, sensation intact throughout, negative dysarthria, negative expressive aphasia, negative receptive aphasia.  .     Data Reviewed: Care during the described time interval was provided by me .  I  have reviewed this patient's available data, including medical history, events of note, physical examination, and all test results as part of my evaluation.  CBC: Recent Labs  Lab 09/20/19 0435 09/20/19 0435 09/20/19 1310 09/20/19 1835 09/21/19 0437 09/21/19 1308 09/21/19 1801 09/21/19 2305 09/22/19 0713  WBC 12.3*  --  15.3*  --  18.9*  --   --   --  9.3  NEUTROABS  --   --  13.7*  --  17.6*  --   --   --  7.4  HGB 7.0*   < > 7.8*   < > 8.3* 8.5* 5.9* 6.8* 7.1*  HCT 23.9*   < > 26.5*   < > 27.8* 28.4* 19.4* 22.5* 23.5*  MCV 90.5  --  91.1  --  90.0  --   --   --  87.4  PLT 314  --  337  --  320  --   --   --  263   < > = values in this interval not displayed.   Basic Metabolic Panel: Recent Labs  Lab 09/20/19 0435 09/21/19 0437 09/22/19 0713  NA 142 138 136  K 3.8 4.6 4.3  CL 93* 95* 98  CO2 33* 29 27  GLUCOSE 107* 150* 65*  BUN 40* 48* 28*  CREATININE 10.11* 10.61* 6.07*  CALCIUM 8.1* 7.8* 7.9*  MG  --  3.3* 2.7*  PHOS  --  6.1* 4.2   GFR: CrCl cannot be calculated (Unknown ideal weight.). Liver Function Tests: Recent Labs  Lab 09/20/19 0435 09/21/19 0437 09/22/19 0713  AST 100* 75* 40  ALT 165* 144* 99*  ALKPHOS 312* 335* 253*  BILITOT 2.0* 3.3* 0.9  PROT 6.0* 6.0* 5.4*  ALBUMIN 2.9* 2.8* 2.5*   Recent Labs  Lab 09/20/19 0435 09/21/19 0437 09/22/19 0713  LIPASE 3,355* 1,374* 773*   No results for input(s): AMMONIA in the last 168 hours. Coagulation Profile: No results for input(s): INR, PROTIME in the last 168 hours. Cardiac Enzymes: No results for input(s): CKTOTAL, CKMB, CKMBINDEX, TROPONINI in the last 168 hours. BNP (last 3 results) No results for input(s): PROBNP in the last 8760 hours. HbA1C: Recent Labs    09/20/19 1310  HGBA1C 4.2*   CBG: Recent Labs  Lab 09/22/19 0347 09/22/19 0358 09/22/19 0430 09/22/19 0514 09/22/19 1301  GLUCAP 40* 41* 68* 104* 71   Lipid Profile: Recent Labs    09/20/19 0435  CHOL 129  HDL 39*   LDLCALC 69  TRIG 103  CHOLHDL 3.3   Thyroid Function Tests: No results for input(s): TSH, T4TOTAL, FREET4, T3FREE, THYROIDAB in the last 72 hours. Anemia Panel: No results for input(s): VITAMINB12, FOLATE, FERRITIN, TIBC, IRON, RETICCTPCT in the last 72 hours. Sepsis Labs: Recent Labs  Lab 09/20/19 1622 09/20/19 1746  LATICACIDVEN 2.3* 1.8    Recent Results (from the past 240 hour(s))  SARS Coronavirus 2 by RT PCR (hospital order, performed in [facility] hospital lab) Nasopharyngeal Nasopharyngeal Swab     Status: None   Collection Time: 09/20/19  2:12 PM   Specimen: Nasopharyngeal Swab  Result Value Ref Range Status   SARS Coronavirus 2 NEGATIVE NEGATIVE Final    Comment: (NOTE) SARS-CoV-2 target nucleic acids are NOT DETECTED.  The SARS-CoV-2 RNA is generally detectable in upper and lower respiratory specimens during the acute phase of infection. The lowest concentration of SARS-CoV-2 viral copies this assay can detect is 250 copies / mL. A negative result does not preclude SARS-CoV-2 infection and should not be used as the sole basis for treatment or other patient management decisions.  A negative result may occur with improper specimen collection / handling, submission of specimen other than nasopharyngeal swab, presence of viral mutation(s) within the areas targeted by this assay, and inadequate number of viral copies (<250 copies / mL). A negative result must be combined with clinical observations, patient history, and epidemiological information.  Fact Sheet for Patients:   StrictlyIdeas.no  Fact Sheet for Healthcare Providers: BankingDealers.co.za  This test is not yet approved or  cleared by the [place] FDA and has been authorized for detection and/or diagnosis of SARS-CoV-2 by FDA under an Emergency Use Authorization (EUA).  This EUA will remain in effect (meaning this test can be used) for the duration of  the COVID-19 declaration under Section 564(b)(1) of the Act, 21 U.S.C. section 360bbb-3(b)(1), unless the authorization is terminated or revoked sooner.  Performed at [facility], [place] [address], [place], [place] [address]          Radiology Studies: CT Abdomen Pelvis W Contrast  Result Date: 09/20/2019 CLINICAL DATA:  Diffuse abdominal pain EXAM: CT ABDOMEN  AND PELVIS WITH CONTRAST TECHNIQUE: Multidetector CT imaging of the abdomen and pelvis was performed using the standard protocol following bolus administration of intravenous contrast. CONTRAST:  163mL OMNIPAQUE IOHEXOL 300 MG/ML  SOLN COMPARISON:  12/16/2017 FINDINGS: Lower chest: Mild emphysematous changes as well as bibasilar atelectasis. Hepatobiliary: Gallbladder has been surgically removed. Prominence of the biliary tree is noted. This is within normal limits for the post cholecystectomy state but has increased in the interval from the prior exam. Correlation with lab values is recommended. No hepatic mass is seen. Pancreas: Pancreas is well visualized within normal enhancement pattern. No areas of pancreatic necrosis are noted. Very mild inflammatory changes are noted surrounding the pancreas particularly in the region of the pancreatic head. Spleen: Normal in size without focal abnormality. Adrenals/Urinary Tract: Adrenal glands are within normal limits. Multiple cysts are noted throughout both kidneys similar to that seen on prior exam. A few tiny nonobstructing renal calculi are noted bilaterally. The ureters are within normal limits. The bladder is decompressed. Bladder wall thickening is noted and stable from the prior study. Stomach/Bowel: Colon shows no obstructive or inflammatory changes. The stomach is fluid filled. There are inflammatory changes surrounding the proximal duodenum as well as what appears to be a small air-fluid collection although this likely represents a duodenal diverticulum posteriorly as this was seen  on the prior study. The possibility of focal duodenal diverticulitis deserves consideration as well. The appendix is within normal limits. Vascular/Lymphatic: Aortic atherosclerosis. Mild dilatation of the infrarenal aorta is noted to 3.1 Cm. Changes of chronic dissection are seen and stable. No enlarged abdominal or pelvic lymph nodes. Reproductive: Prostate therapy seeds are noted. Other: No abdominal wall hernia or abnormality. No abdominopelvic ascites. Musculoskeletal: Degenerative changes of lumbar spine are seen. IMPRESSION: Mild inflammatory changes surrounding the pancreas consistent with early pancreatitis. No pancreatic necrosis is identified at this time. This likely contributes to increased dilatation of the biliary tree from local edema. Inflammatory changes are noted surrounding the second portion of the duodenum with what appears to be an inflamed duodenal diverticulum posteriorly. These changes are likely secondary to the known pancreatitis. Stable aortic aneurysm measuring 3.1 cm. Electronically Signed   By: Inez Catalina M.D.   On: 09/20/2019 15:00        Scheduled Meds: . calcitRIOL  1 mcg Oral Q M,W,F-HD  . Chlorhexidine Gluconate Cloth  6 each Topical Q0600  . darbepoetin (ARANESP) injection - DIALYSIS  60 mcg Intravenous Q Thu-HD  . insulin aspart  0-6 Units Subcutaneous Q4H  . metoprolol tartrate  5 mg Intravenous Q8H  . [START ON 09/23/2019] pantoprazole  40 mg Oral Daily   Continuous Infusions: . sodium chloride 75 mL/hr at 09/22/19 1325     LOS: 2 days    Time spent:40 min    Itzayana Pardy, Geraldo Docker, MD [facility] Pager [phone]  If 7PM-7AM, please contact night-coverage [email or web address] Password [facility] 09/22/2019, 2:23 PM

## 2019-09-23 LAB — CBC WITH DIFFERENTIAL/PLATELET
Abs Immature Granulocytes: 0.1 10*3/uL — ABNORMAL HIGH (ref 0.00–0.07)
Basophils Absolute: 0 10*3/uL (ref 0.0–0.1)
Basophils Relative: 0 %
Eosinophils Absolute: 0.3 10*3/uL (ref 0.0–0.5)
Eosinophils Relative: 3 %
HCT: 24.9 % — ABNORMAL LOW (ref 39.0–52.0)
Hemoglobin: 7.8 g/dL — ABNORMAL LOW (ref 13.0–17.0)
Immature Granulocytes: 1 %
Lymphocytes Relative: 11 %
Lymphs Abs: 1 10*3/uL (ref 0.7–4.0)
MCH: 27.3 pg (ref 26.0–34.0)
MCHC: 31.3 g/dL (ref 30.0–36.0)
MCV: 87.1 fL (ref 80.0–100.0)
Monocytes Absolute: 0.8 10*3/uL (ref 0.1–1.0)
Monocytes Relative: 9 %
Neutro Abs: 6.6 10*3/uL (ref 1.7–7.7)
Neutrophils Relative %: 76 %
Platelets: 289 10*3/uL (ref 150–400)
RBC: 2.86 MIL/uL — ABNORMAL LOW (ref 4.22–5.81)
RDW: 19.6 % — ABNORMAL HIGH (ref 11.5–15.5)
WBC: 8.8 10*3/uL (ref 4.0–10.5)
nRBC: 0 % (ref 0.0–0.2)

## 2019-09-23 LAB — COMPREHENSIVE METABOLIC PANEL
ALT: 85 U/L — ABNORMAL HIGH (ref 0–44)
AST: 30 U/L (ref 15–41)
Albumin: 2.5 g/dL — ABNORMAL LOW (ref 3.5–5.0)
Alkaline Phosphatase: 253 U/L — ABNORMAL HIGH (ref 38–126)
Anion gap: 11 (ref 5–15)
BUN: 14 mg/dL (ref 8–23)
CO2: 24 mmol/L (ref 22–32)
Calcium: 8.3 mg/dL — ABNORMAL LOW (ref 8.9–10.3)
Chloride: 102 mmol/L (ref 98–111)
Creatinine, Ser: 3.72 mg/dL — ABNORMAL HIGH (ref 0.61–1.24)
GFR calc Af Amer: 16 mL/min — ABNORMAL LOW (ref >60)
GFR calc non Af Amer: 14 mL/min — ABNORMAL LOW (ref >60)
Glucose, Bld: 85 mg/dL (ref 70–99)
Potassium: 3.7 mmol/L (ref 3.5–5.1)
Sodium: 137 mmol/L (ref 135–145)
Total Bilirubin: 1 mg/dL (ref 0.3–1.2)
Total Protein: 5.6 g/dL — ABNORMAL LOW (ref 6.5–8.1)

## 2019-09-23 LAB — GLUCOSE, CAPILLARY
Glucose-Capillary: 83 mg/dL (ref 70–99)
Glucose-Capillary: 126 mg/dL — ABNORMAL HIGH (ref 70–99)
Glucose-Capillary: 117 mg/dL — ABNORMAL HIGH (ref 70–99)
Glucose-Capillary: 143 mg/dL — ABNORMAL HIGH (ref 70–99)
Glucose-Capillary: 94 mg/dL (ref 70–99)

## 2019-09-23 LAB — MAGNESIUM: Magnesium: 2.2 mg/dL (ref 1.7–2.4)

## 2019-09-23 LAB — LIPASE, BLOOD: Lipase: 491 U/L — ABNORMAL HIGH (ref 11–51)

## 2019-09-23 LAB — PHOSPHORUS: Phosphorus: 2.7 mg/dL (ref 2.5–4.6)

## 2019-09-23 MED ORDER — TRAMADOL HCL 50 MG PO TABS
50.0000 mg | ORAL_TABLET | Freq: Once | ORAL | Status: AC
  Administered 2019-09-23: 50 mg via ORAL
  Filled 2019-09-23: qty 1

## 2019-09-23 MED ORDER — METOPROLOL TARTRATE 12.5 MG HALF TABLET
12.5000 mg | ORAL_TABLET | Freq: Two times a day (BID) | ORAL | Status: DC
  Administered 2019-09-23 – 2019-09-26 (×6): 12.5 mg via ORAL
  Filled 2019-09-23 (×6): qty 1

## 2019-09-23 NOTE — Progress Notes (Signed)
[facility] Progress Note  Jonanthan Bolender 86 y.o. [birth year]  CC: Pancreatitis   Subjective: Patient seen and examined at bedside.  Feeling better.  Denies abdominal pain.  Tolerating liquid diet.  ROS : Afebrile, negative for chest pain   Objective: Vital signs in last 24 hours: Vitals:   09/23/19 0421 09/23/19 0458  BP: (!) 185/93 (!) 159/70  Pulse: 82 72  Resp: 20   Temp: (!) 97.5 F (36.4 C)   SpO2: 100%     Physical Exam:  General:  Alert, cooperative, no distress, appears stated age  Head:  Normocephalic, without obvious abnormality, atraumatic  Eyes:  , EOM's intact,   Lungs:    No respiratory distress.  Anterior exam only  Heart:  Regular rate and rhythm, S1, S2 normal  Abdomen:   Soft, non-tender, nondistended, bowel sounds present.  Scar marks from previous surgery noted  Extremities: Extremities normal, atraumatic, no  edema       Lab Results: Recent Labs    09/22/19 0713 09/23/19 0447  NA 136 137  K 4.3 3.7  CL 98 102  CO2 27 24  GLUCOSE 65* 85  BUN 28* 14  CREATININE 6.07* 3.72*  CALCIUM 7.9* 8.3*  MG 2.7* 2.2  PHOS 4.2 2.7   Recent Labs    09/22/19 0713 09/23/19 0447  AST 40 30  ALT 99* 85*  ALKPHOS 253* 253*  BILITOT 0.9 1.0  PROT 5.4* 5.6*  ALBUMIN 2.5* 2.5*   Recent Labs    09/22/19 0713 09/23/19 0447  WBC 9.3 8.8  NEUTROABS 7.4 6.6  HGB 7.1* 7.8*  HCT 23.5* 24.9*  MCV 87.4 87.1  PLT 263 289   No results for input(s): LABPROT, INR in the last 72 hours.    Assessment/Plan: -Pancreatitis.  Suspected biliary etiology.  No alcohol use.  T bili normal. -Anemia with some dark stools.  Could be from duodenitis that was seen on the CT scan. -End-stage renal disease.  On dialysis  Recommendations ------------------------- -Continue current supportive care -Advance diet to low-fat -Continue IV Protonix for today, switch to p.o. Protonix tomorrow -If tolerating diet, hopefully discharge tomorrow with consideration  for outpatient EGD along with EUS and possible ERCP -GI will follow   Otis Brace MD, [place] 09/23/2019, 11:12 AM  Contact #  [phone]

## 2019-09-23 NOTE — Progress Notes (Signed)
PROGRESS NOTE    Sean Best  FXT:[id number] DOB: 05/07/1933 DOA: 09/20/2019 PCP: Seward Carol, MD     Brief Narrative:  Sean Best is a 86 y.o. male BM PMHx CVA, ESRD on HD M/W/F, HTN, s/p permanent cardiac pacemaker, HLD, prostate cancer, umbilical hernia.  Presents with chief complaint of abdominal pain, nausea, vomiting and diarrhea since Friday. Patient explains the pain started onFriday after his dialysis treatment. He hasfelt nauseated and epigastric pain that now has turned into diffuse abdominal pain. Describes the pain as a constant throbbing pain denies alleviating factors and admits that food tends to make the pain worse. He admits to vomiting multiple times, denies seeing any blood in his vomit. Patient admits that he is has had frequent diarrhea without noticing blood or dark tarry stools. Patient is a known diabetic, has no history of gallstones or pancreatitis. Patient has past surgical history ofcholeyectomy and hernia repair. Patient has a medical history of arthritis, end-stage renal disease on dialysis, hyperlipidemia, hypertension, type 2 diabetes,. Patient denies headache, congestion, sore throat, chest pain, shortness of breath, dysuria, leg pain.   Subjective: 7/24 afebrile overnight negative CP, negative S OB, negative postprandial pain.  Patient states was able to tolerate green beans, grits, some ice cream.  Positive gas, negative BM, negative diarrhea, negative N/V  Assessment & Plan: Covid vaccination;   Principal Problem:   Acute pancreatitis Active Problems:   HTN (hypertension)   Heart block   Cardiac pacemaker in situ ([employer] DDD)   End-stage renal disease on hemodialysis ([place])   HLD (hyperlipidemia)   Prostate cancer ([place])   Umbilical hernia   History of noncompliance with medical treatment   Diabetes mellitus type 2, uncontrolled, with complications ([place])   Acute GI bleeding   Acute pancreatitis -Appears to be idiopathic.   Patient states negative consumption of EtOH -7/21 EtOH negative -Urine tox screen; patient makes no urine at all.  -7/22 normal saline 75 ml/hr. monitor for fluid overload. Recent Labs  Lab 09/20/19 0435 09/21/19 0437 09/22/19 0713 09/23/19 0447  LIPASE 3,355* 1,374* 773* 491*  -Per GI may start sips of clear liquid. -Per GI may require EGD/EUS once patient's pancreas has defervesced. -Dilaudid PRN abdominal pain -7/24 per [facility] note they plan on performing EGD/EUS on an outpatient basis after patient has recovered.    Essential HTN -Hydralazine IV PRN -7/24 change Metoprolol 12.5 mg BID  Heart block s/p cardiac pacemaker -Stable  ESRD on HD M/W/F -Per nephrology will perform HD in the a.m.  DM type II controlled with complications -2/99 hemoglobin A1c= 4.2 -Very sensitive SSI  HLD -7/21 LDL= 69  Noncompliance with medical treatment -Per patient secondary to significant diarrhea is why he missed his HD on Monday.  Umbilical hernia -Reducible no indication of entrapment  Acute GI bleed (baseline Hg 9.5) -7/21 heme positive -ED contacted GI who will see patient in a.m. -7/22 DC H/H q 6 hr hemoglobin stabilized -Do not want to start Protonix drip outpatient secondary to fluid load and not receiving HD until a.m. -7/23 change Protonix po 40 mg BID -Transfuse for hemoglobin<7 -7/21 transfuse 1 unit PRBC Recent Labs  Lab 09/21/19 1308 09/21/19 1801 09/21/19 2305 09/22/19 0713 09/23/19 0447  HGB 8.5* 5.9* 6.8* 7.1* 7.8*  -Stable  Prostate cancer -Review of patient's chart does not appear to have anything recent.  We will have to inquire of  patient in the a.m.     DVT prophylaxis: SCD Code Status: Full Family Communication:  Status  is: Inpatient    Dispo: The patient is from: Home              Anticipated d/c is to: Home              Anticipated d/c date is: 8/2              Patient currently unstable      Consultants:  [facility].  [facility]   Procedures/Significant Events:    I have personally reviewed and interpreted all radiology studies and my findings are as above.  VENTILATOR SETTINGS:    Cultures   Antimicrobials:    Devices    LINES / TUBES:      Continuous Infusions: . sodium chloride 75 mL/hr at 09/23/19 0145     Objective: Vitals:   09/22/19 1307 09/23/19 0421 09/23/19 0458 09/23/19 0810  BP: (!) 133/76 (!) 185/93 (!) 159/70   Pulse: 78 82 72   Resp: 18 20    Temp: 99.2 F (37.3 C) (!) 97.5 F (36.4 C)    TempSrc: Oral Oral    SpO2: 97% 100%    Weight:      Height:    5\' 5"  (1.651 m)    Intake/Output Summary (Last 24 hours) at 09/23/2019 1307 Last data filed at 09/23/2019 0800 Gross per 24 hour  Intake 2579.37 ml  Output 0 ml  Net 2579.37 ml   Filed Weights   09/21/19 1100 09/22/19 0735 09/22/19 1152  Weight: 83.6 kg 86.1 kg 83.1 kg   Physical Exam:  General: A/O x4, No acute respiratory distress Eyes: negative scleral hemorrhage, negative anisocoria, negative icterus ENT: Negative Runny nose, negative gingival bleeding, Neck:  Negative scars, masses, torticollis, lymphadenopathy, JVD Lungs: Clear to auscultation bilaterally without wheezes or crackles Cardiovascular: Regular rate and rhythm without murmur gallop or rub normal S1 and S2 Abdomen: negative abdominal pain, nondistended, positive soft, bowel sounds, no rebound, no ascites, no appreciable mass Extremities: No significant cyanosis, clubbing, or edema bilateral lower extremities Skin: Negative rashes, lesions, ulcers Psychiatric:  Negative depression, negative anxiety, negative fatigue, negative mania  Central nervous system:  Cranial nerves II through XII intact, tongue/uvula midline, all extremities muscle strength 5/5, sensation intact throughout, negative dysarthria, negative expressive aphasia, negative receptive aphasia.   .     Data Reviewed: Care during the described time  interval was provided by me .  I have reviewed this patient's available data, including medical history, events of note, physical examination, and all test results as part of my evaluation.  CBC: Recent Labs  Lab 09/20/19 0435 09/20/19 0435 09/20/19 1310 09/20/19 1835 09/21/19 0437 09/21/19 0437 09/21/19 1308 09/21/19 1801 09/21/19 2305 09/22/19 0713 09/23/19 0447  WBC 12.3*  --  15.3*  --  18.9*  --   --   --   --  9.3 8.8  NEUTROABS  --   --  13.7*  --  17.6*  --   --   --   --  7.4 6.6  HGB 7.0*   < > 7.8*   < > 8.3*   < > 8.5* 5.9* 6.8* 7.1* 7.8*  HCT 23.9*   < > 26.5*   < > 27.8*   < > 28.4* 19.4* 22.5* 23.5* 24.9*  MCV 90.5  --  91.1  --  90.0  --   --   --   --  87.4 87.1  PLT 314  --  337  --  320  --   --   --   --  263 289   < > = values in this interval not displayed.   Basic Metabolic Panel: Recent Labs  Lab 09/20/19 0435 09/21/19 0437 09/22/19 0713 09/23/19 0447  NA 142 138 136 137  K 3.8 4.6 4.3 3.7  CL 93* 95* 98 102  CO2 33* 29 27 24   GLUCOSE 107* 150* 65* 85  BUN 40* 48* 28* 14  CREATININE 10.11* 10.61* 6.07* 3.72*  CALCIUM 8.1* 7.8* 7.9* 8.3*  MG  --  3.3* 2.7* 2.2  PHOS  --  6.1* 4.2 2.7   GFR: Estimated Creatinine Clearance: 14.7 mL/min (A) (by C-G formula based on SCr of 3.72 mg/dL (H)). Liver Function Tests: Recent Labs  Lab 09/20/19 0435 09/21/19 0437 09/22/19 0713 09/23/19 0447  AST 100* 75* 40 30  ALT 165* 144* 99* 85*  ALKPHOS 312* 335* 253* 253*  BILITOT 2.0* 3.3* 0.9 1.0  PROT 6.0* 6.0* 5.4* 5.6*  ALBUMIN 2.9* 2.8* 2.5* 2.5*   Recent Labs  Lab 09/20/19 0435 09/21/19 0437 09/22/19 0713 09/23/19 0447  LIPASE 3,355* 1,374* 773* 491*   No results for input(s): AMMONIA in the last 168 hours. Coagulation Profile: No results for input(s): INR, PROTIME in the last 168 hours. Cardiac Enzymes: No results for input(s): CKTOTAL, CKMB, CKMBINDEX, TROPONINI in the last 168 hours. BNP (last 3 results) No results for input(s): PROBNP  in the last 8760 hours. HbA1C: Recent Labs    09/20/19 1310  HGBA1C 4.2*   CBG: Recent Labs  Lab 09/22/19 2044 09/22/19 2340 09/23/19 0420 09/23/19 0755 09/23/19 1138  GLUCAP 107* 82 83 126* 117*   Lipid Profile: No results for input(s): CHOL, HDL, LDLCALC, TRIG, CHOLHDL, LDLDIRECT in the last 72 hours. Thyroid Function Tests: No results for input(s): TSH, T4TOTAL, FREET4, T3FREE, THYROIDAB in the last 72 hours. Anemia Panel: No results for input(s): VITAMINB12, FOLATE, FERRITIN, TIBC, IRON, RETICCTPCT in the last 72 hours. Sepsis Labs: Recent Labs  Lab 09/20/19 1622 09/20/19 1746  LATICACIDVEN 2.3* 1.8    Recent Results (from the past 240 hour(s))  SARS Coronavirus 2 by RT PCR (hospital order, performed in [facility] hospital lab) Nasopharyngeal Nasopharyngeal Swab     Status: None   Collection Time: 09/20/19  2:12 PM   Specimen: Nasopharyngeal Swab  Result Value Ref Range Status   SARS Coronavirus 2 NEGATIVE NEGATIVE Final    Comment: (NOTE) SARS-CoV-2 target nucleic acids are NOT DETECTED.  The SARS-CoV-2 RNA is generally detectable in upper and lower respiratory specimens during the acute phase of infection. The lowest concentration of SARS-CoV-2 viral copies this assay can detect is 250 copies / mL. A negative result does not preclude SARS-CoV-2 infection and should not be used as the sole basis for treatment or other patient management decisions.  A negative result may occur with improper specimen collection / handling, submission of specimen other than nasopharyngeal swab, presence of viral mutation(s) within the areas targeted by this assay, and inadequate number of viral copies (<250 copies / mL). A negative result must be combined with clinical observations, patient history, and epidemiological information.  Fact Sheet for Patients:   StrictlyIdeas.no  Fact Sheet for Healthcare  Providers: BankingDealers.co.za  This test is not yet approved or  cleared by the [place] FDA and has been authorized for detection and/or diagnosis of SARS-CoV-2 by FDA under an Emergency Use Authorization (EUA).  This EUA will remain in effect (meaning this test can be used) for the duration of the COVID-19 declaration under Section 564(b)(1) of  the Act, 21 U.S.C. section 360bbb-3(b)(1), unless the authorization is terminated or revoked sooner.  Performed at [facility], [place] [address] [place],  [address]          Radiology Studies: No results found.      Scheduled Meds: . calcitRIOL  1 mcg Oral Q M,W,F-HD  . Chlorhexidine Gluconate Cloth  6 each Topical Q0600  . darbepoetin (ARANESP) injection - DIALYSIS  60 mcg Intravenous Q Thu-HD  . insulin aspart  0-6 Units Subcutaneous Q4H  . metoprolol tartrate  5 mg Intravenous Q8H  . pantoprazole  40 mg Oral BID   Continuous Infusions: . sodium chloride 75 mL/hr at 09/23/19 0145     LOS: 3 days    Time spent:40 min    Madeliene Tejera, Geraldo Docker, MD [facility] Pager [phone]  If 7PM-7AM, please contact night-coverage [email or web address] Password [facility] 09/23/2019, 1:07 PM

## 2019-09-23 NOTE — Progress Notes (Signed)
[facility] Progress Note   Subjective:   Seen in room.  Feeling fine.  No c/os.   Objective Vitals:   09/22/19 1307 09/23/19 0421 09/23/19 0458 09/23/19 0810  BP: (!) 133/76 (!) 185/93 (!) 159/70   Pulse: 78 82 72   Resp: 18 20    Temp: 99.2 F (37.3 C) (!) 97.5 F (36.4 C)    TempSrc: Oral Oral    SpO2: 97% 100%    Weight:      Height:    5\' 5"  (1.651 m)   Physical Exam General: elderly man, comfortable Heart: RRR Lungs: clear ant Abdomen: soft, nontender with mod palpation Extremities: trace edema Dialysis Access:  L RC AVF aneurysmal large, +t/b  Additional Objective Labs: Basic Metabolic Panel: Recent Labs  Lab 09/21/19 0437 09/22/19 0713 09/23/19 0447  NA 138 136 137  K 4.6 4.3 3.7  CL 95* 98 102  CO2 29 27 24   GLUCOSE 150* 65* 85  BUN 48* 28* 14  CREATININE 10.61* 6.07* 3.72*  CALCIUM 7.8* 7.9* 8.3*  PHOS 6.1* 4.2 2.7   Liver Function Tests: Recent Labs  Lab 09/21/19 0437 09/22/19 0713 09/23/19 0447  AST 75* 40 30  ALT 144* 99* 85*  ALKPHOS 335* 253* 253*  BILITOT 3.3* 0.9 1.0  PROT 6.0* 5.4* 5.6*  ALBUMIN 2.8* 2.5* 2.5*   Recent Labs  Lab 09/21/19 0437 09/22/19 0713 09/23/19 0447  LIPASE 1,374* 773* 491*   CBC: Recent Labs  Lab 09/20/19 0435 09/20/19 0435 09/20/19 1310 09/20/19 1835 09/21/19 0437 09/21/19 1308 09/21/19 2305 09/22/19 0713 09/23/19 0447  WBC 12.3*   < > 15.3*  --  18.9*  --   --  9.3 8.8  NEUTROABS  --    < > 13.7*  --  17.6*  --   --  7.4 6.6  HGB 7.0*   < > 7.8*   < > 8.3*   < > 6.8* 7.1* 7.8*  HCT 23.9*   < > 26.5*   < > 27.8*   < > 22.5* 23.5* 24.9*  MCV 90.5  --  91.1  --  90.0  --   --  87.4 87.1  PLT 314   < > 337  --  320  --   --  263 289   < > = values in this interval not displayed.   Blood Culture    Component Value Date/Time   SDES BLOOD RIGHT HAND 12/06/2017 1447   SPECREQUEST  12/06/2017 1447    BOTTLES DRAWN AEROBIC AND ANAEROBIC Blood Culture adequate volume   CULT   12/06/2017 1447    NO GROWTH 5 DAYS Performed at [facility], [place] [address], [place],  [address]    REPTSTATUS 12/11/2017 FINAL 12/06/2017 1447    Cardiac Enzymes: No results for input(s): CKTOTAL, CKMB, CKMBINDEX, TROPONINI in the last 168 hours. CBG: Recent Labs  Lab 09/22/19 1702 09/22/19 2044 09/22/19 2340 09/23/19 0420 09/23/19 0755  GLUCAP 97 107* 82 83 126*   Iron Studies: No results for input(s): IRON, TIBC, TRANSFERRIN, FERRITIN in the last 72 hours. @lablastinr3 @ Studies/Results: No results found. Medications: . sodium chloride 75 mL/hr at 09/23/19 0145   . calcitRIOL  1 mcg Oral Q M,W,F-HD  . Chlorhexidine Gluconate Cloth  6 each Topical Q0600  . darbepoetin (ARANESP) injection - DIALYSIS  60 mcg Intravenous Q Thu-HD  . insulin aspart  0-6 Units Subcutaneous Q4H  . metoprolol tartrate  5 mg Intravenous Q8H  .  pantoprazole  40 mg Oral BID    Dialysis Orders: MWF, [facility], 4 hours, 2K/2.5 calcium, BFR 450, DFR AF 2.0, EDW 83 kg, UF profile #2, left radiocephalic fistula, 15 G needles, no heparin, calcitriol 1 mcg 3 times weekly, Mircera 225 mcg IV every 2 weeks, Benadryl 50 mg IV q. HD as needed,  Assessment/Plan: 1.  Acute pancreatitis: No preceding alcohol use and appears to be idiopathic.  Ongoing symptomatic management with bowel rest. 2.  End-stage renal disease: With missed hemodialysis treatment on Monday/Wednesday of this week.  HD Thurs and now Friday to resume outpt schedule. 3.  Hypertension: Likely worsened by volume status, resume oral antihypertensive therapy and monitor with hemodialysis/ultrafiltration today. 4.  Anemia: Hb 7.8 this AM which is recent BL.  Had ESA on admission.  CTM. 5.  Secondary hyperparathyroidism: Calcitriol and resume binders when tol diet -- currently on full liquid.  Phos 2.7 toda.  OK for d/c from my perspective when tolerating diet.   Appears family requesting palliative consult.   Jannifer Hick  MD 09/23/2019, 9:43 AM  [facility] Pager: [phone]

## 2019-09-24 LAB — COMPREHENSIVE METABOLIC PANEL
ALT: 68 U/L — ABNORMAL HIGH (ref 0–44)
AST: 30 U/L (ref 15–41)
Albumin: 2.5 g/dL — ABNORMAL LOW (ref 3.5–5.0)
Alkaline Phosphatase: 224 U/L — ABNORMAL HIGH (ref 38–126)
Anion gap: 10 (ref 5–15)
BUN: 21 mg/dL (ref 8–23)
CO2: 24 mmol/L (ref 22–32)
Calcium: 8.5 mg/dL — ABNORMAL LOW (ref 8.9–10.3)
Chloride: 102 mmol/L (ref 98–111)
Creatinine, Ser: 5.01 mg/dL — ABNORMAL HIGH (ref 0.61–1.24)
GFR calc Af Amer: 11 mL/min — ABNORMAL LOW (ref >60)
GFR calc non Af Amer: 10 mL/min — ABNORMAL LOW (ref >60)
Glucose, Bld: 144 mg/dL — ABNORMAL HIGH (ref 70–99)
Potassium: 3.9 mmol/L (ref 3.5–5.1)
Sodium: 136 mmol/L (ref 135–145)
Total Bilirubin: 0.9 mg/dL (ref 0.3–1.2)
Total Protein: 5.6 g/dL — ABNORMAL LOW (ref 6.5–8.1)

## 2019-09-24 LAB — LIPASE, BLOOD: Lipase: 733 U/L — ABNORMAL HIGH (ref 11–51)

## 2019-09-24 LAB — GLUCOSE, CAPILLARY
Glucose-Capillary: 123 mg/dL — ABNORMAL HIGH (ref 70–99)
Glucose-Capillary: 123 mg/dL — ABNORMAL HIGH (ref 70–99)
Glucose-Capillary: 128 mg/dL — ABNORMAL HIGH (ref 70–99)
Glucose-Capillary: 129 mg/dL — ABNORMAL HIGH (ref 70–99)
Glucose-Capillary: 78 mg/dL (ref 70–99)
Glucose-Capillary: 90 mg/dL (ref 70–99)
Glucose-Capillary: 95 mg/dL (ref 70–99)

## 2019-09-24 LAB — CBC WITH DIFFERENTIAL/PLATELET
Abs Immature Granulocytes: 0.13 10*3/uL — ABNORMAL HIGH (ref 0.00–0.07)
Basophils Absolute: 0.1 10*3/uL (ref 0.0–0.1)
Basophils Relative: 1 %
Eosinophils Absolute: 0.5 10*3/uL (ref 0.0–0.5)
Eosinophils Relative: 5 %
HCT: 23.8 % — ABNORMAL LOW (ref 39.0–52.0)
Hemoglobin: 7.3 g/dL — ABNORMAL LOW (ref 13.0–17.0)
Immature Granulocytes: 2 %
Lymphocytes Relative: 11 %
Lymphs Abs: 1 10*3/uL (ref 0.7–4.0)
MCH: 27 pg (ref 26.0–34.0)
MCHC: 30.7 g/dL (ref 30.0–36.0)
MCV: 88.1 fL (ref 80.0–100.0)
Monocytes Absolute: 0.8 10*3/uL (ref 0.1–1.0)
Monocytes Relative: 9 %
Neutro Abs: 6.4 10*3/uL (ref 1.7–7.7)
Neutrophils Relative %: 72 %
Platelets: 341 10*3/uL (ref 150–400)
RBC: 2.7 MIL/uL — ABNORMAL LOW (ref 4.22–5.81)
RDW: 19.8 % — ABNORMAL HIGH (ref 11.5–15.5)
WBC: 8.8 10*3/uL (ref 4.0–10.5)
nRBC: 0 % (ref 0.0–0.2)

## 2019-09-24 LAB — PHOSPHORUS: Phosphorus: 3.3 mg/dL (ref 2.5–4.6)

## 2019-09-24 LAB — MAGNESIUM: Magnesium: 2.2 mg/dL (ref 1.7–2.4)

## 2019-09-24 MED ORDER — ACETAMINOPHEN 325 MG PO TABS
650.0000 mg | ORAL_TABLET | Freq: Once | ORAL | Status: AC
  Administered 2019-09-24: 650 mg via ORAL
  Filled 2019-09-24: qty 2

## 2019-09-24 MED ORDER — POLYETHYLENE GLYCOL 3350 17 G PO PACK
17.0000 g | PACK | Freq: Every day | ORAL | Status: DC
  Administered 2019-09-24 – 2019-09-25 (×2): 17 g via ORAL
  Filled 2019-09-24 (×3): qty 1

## 2019-09-24 NOTE — Progress Notes (Signed)
PROGRESS NOTE    Sean Best  GXQ:[id number] DOB: [birth year] DOA: 09/20/2019 PCP: Seward Carol, MD     Brief Narrative:  Sean Best is a 86 y.o. male BM PMHx CVA, ESRD on HD M/W/F, HTN, s/p permanent cardiac pacemaker, HLD, prostate cancer, umbilical hernia.  Presents with chief complaint of abdominal pain, nausea, vomiting and diarrhea since Friday. Patient explains the pain started onFriday after his dialysis treatment. He hasfelt nauseated and epigastric pain that now has turned into diffuse abdominal pain. Describes the pain as a constant throbbing pain denies alleviating factors and admits that food tends to make the pain worse. He admits to vomiting multiple times, denies seeing any blood in his vomit. Patient admits that he is has had frequent diarrhea without noticing blood or dark tarry stools. Patient is a known diabetic, has no history of gallstones or pancreatitis. Patient has past surgical history ofcholeyectomy and hernia repair. Patient has a medical history of arthritis, end-stage renal disease on dialysis, hyperlipidemia, hypertension, type 2 diabetes,. Patient denies headache, congestion, sore throat, chest pain, shortness of breath, dysuria, leg pain.   Subjective: 7/25 afebrile overnight, negative CP, negative S OB, negative postprandial pain.  Negative diarrhea, negative nausea, negative vomiting  Assessment & Plan: Covid vaccination;   Principal Problem:   Acute pancreatitis Active Problems:   HTN (hypertension)   Heart block   Cardiac pacemaker in situ ([employer] DDD)   End-stage renal disease on hemodialysis ([place])   HLD (hyperlipidemia)   Prostate cancer ([place])   Umbilical hernia   History of noncompliance with medical treatment   Diabetes mellitus type 2, uncontrolled, with complications ([place])   Acute GI bleeding   Acute pancreatitis -Appears to be idiopathic.  Patient states negative consumption of EtOH -7/21 EtOH negative -Urine tox  screen; patient makes no urine at all.  -7/22 normal saline 75 ml/hr. monitor for fluid overload. Recent Labs  Lab 09/20/19 0435 09/21/19 0437 09/22/19 0713 09/23/19 0447 09/24/19 0138  LIPASE 3,355* 1,374* 773* 491* 733*  -Per GI may start sips of clear liquid. -Per GI may require EGD/EUS once patient's pancreas has defervesced. -Dilaudid PRN abdominal pain -7/25 lipase trending up -7/25 per [facility] note they plan on performing EGD/EUS on Tuesday if patient remains hospitalized otherwise on an  outpatient basis after patient has recovered.    Essential HTN -Hydralazine IV PRN -7/24 change Metoprolol 12.5 mg BID  Heart block s/p cardiac pacemaker -Stable  ESRD on HD M/W/F -Per nephrology will perform HD in the a.m.  DM type II controlled with complications -1/44 hemoglobin A1c= 4.2 -Very sensitive SSI  HLD -7/21 LDL= 69  Noncompliance with medical treatment -Per patient secondary to significant diarrhea is why he missed his HD on Monday.  Umbilical hernia -Reducible no indication of entrapment  Acute GI bleed (baseline Hg 9.5) -7/21 heme positive -ED contacted GI who will see patient in a.m. -7/22 DC H/H q 6 hr hemoglobin stabilized -Do not want to start Protonix drip outpatient secondary to fluid load and not receiving HD until a.m. -7/23 change Protonix po 40 mg BID -Transfuse for hemoglobin<7 -7/21 transfuse 1 unit PRBC Recent Labs  Lab 09/21/19 1801 09/21/19 2305 09/22/19 0713 09/23/19 0447 09/24/19 0138  HGB 5.9* 6.8* 7.1* 7.8* 7.3*  -7/25 slight decrease we will continue to monitor  Prostate cancer -Review of patient's chart does not appear to have anything recent.  We will have to inquire of  patient in the a.m.     DVT prophylaxis: SCD  Code Status: Full Family Communication:  Status is: Inpatient    Dispo: The patient is from: Home              Anticipated d/c is to: Home              Anticipated d/c date is: 8/2               Patient currently unstable      Consultants:  [facility] Dr. Arta Silence Nephrology   Procedures/Significant Events:    I have personally reviewed and interpreted all radiology studies and my findings are as above.  VENTILATOR SETTINGS:    Cultures   Antimicrobials:    Devices    LINES / TUBES:      Continuous Infusions: . sodium chloride 75 mL/hr at 09/23/19 0145     Objective: Vitals:   09/23/19 0810 09/23/19 1319 09/23/19 1954 09/24/19 0401  BP:  127/69 (!) 167/88 (!) 138/70  Pulse:  78 80 72  Resp:  16 18 18   Temp:  98.3 F (36.8 C) 98.3 F (36.8 C) 98 F (36.7 C)  TempSrc:  Oral Oral Oral  SpO2:  100% 100% 100%  Weight:      Height: 5\' 5"  (1.651 m)       Intake/Output Summary (Last 24 hours) at 09/24/2019 1407 Last data filed at 09/24/2019 1130 Gross per 24 hour  Intake 1091.35 ml  Output --  Net 1091.35 ml   Filed Weights   09/21/19 1100 09/22/19 0735 09/22/19 1152  Weight: 83.6 kg 86.1 kg 83.1 kg   Physical Exam:  General: A/O x4, No acute respiratory distress Eyes: negative scleral hemorrhage, negative anisocoria, negative icterus ENT: Negative Runny nose, negative gingival bleeding, Neck:  Negative scars, masses, torticollis, lymphadenopathy, JVD Lungs: Clear to auscultation bilaterally without wheezes or crackles Cardiovascular: Regular rate and rhythm without murmur gallop or rub normal S1 and S2 Abdomen: negative abdominal pain, nondistended, positive soft, bowel sounds, no rebound, no ascites, no appreciable mass Extremities: No significant cyanosis, clubbing, or edema bilateral lower extremities Skin: Negative rashes, lesions, ulcers Psychiatric:  Negative depression, negative anxiety, negative fatigue, negative mania  Central nervous system:  Cranial nerves II through XII intact, tongue/uvula midline, all extremities muscle strength 5/5, sensation intact throughout, negative dysarthria, negative expressive aphasia, negative  receptive aphasia.   .     Data Reviewed: Care during the described time interval was provided by me .  I have reviewed this patient's available data, including medical history, events of note, physical examination, and all test results as part of my evaluation.  CBC: Recent Labs  Lab 09/20/19 1310 09/20/19 1835 09/21/19 0437 09/21/19 1308 09/21/19 1801 09/21/19 2305 09/22/19 0713 09/23/19 0447 09/24/19 0138  WBC 15.3*  --  18.9*  --   --   --  9.3 8.8 8.8  NEUTROABS 13.7*  --  17.6*  --   --   --  7.4 6.6 6.4  HGB 7.8*   < > 8.3*   < > 5.9* 6.8* 7.1* 7.8* 7.3*  HCT 26.5*   < > 27.8*   < > 19.4* 22.5* 23.5* 24.9* 23.8*  MCV 91.1  --  90.0  --   --   --  87.4 87.1 88.1  PLT 337  --  320  --   --   --  263 289 341   < > = values in this interval not displayed.   Basic Metabolic Panel: Recent Labs  Lab 09/20/19  1194 09/21/19 0437 09/22/19 0713 09/23/19 0447 09/24/19 0138  NA 142 138 136 137 136  K 3.8 4.6 4.3 3.7 3.9  CL 93* 95* 98 102 102  CO2 33* 29 27 24 24   GLUCOSE 107* 150* 65* 85 144*  BUN 40* 48* 28* 14 21  CREATININE 10.11* 10.61* 6.07* 3.72* 5.01*  CALCIUM 8.1* 7.8* 7.9* 8.3* 8.5*  MG  --  3.3* 2.7* 2.2 2.2  PHOS  --  6.1* 4.2 2.7 3.3   GFR: Estimated Creatinine Clearance: 10.9 mL/min (A) (by C-G formula based on SCr of 5.01 mg/dL (H)). Liver Function Tests: Recent Labs  Lab 09/20/19 0435 09/21/19 0437 09/22/19 0713 09/23/19 0447 09/24/19 0138  AST 100* 75* 40 30 30  ALT 165* 144* 99* 85* 68*  ALKPHOS 312* 335* 253* 253* 224*  BILITOT 2.0* 3.3* 0.9 1.0 0.9  PROT 6.0* 6.0* 5.4* 5.6* 5.6*  ALBUMIN 2.9* 2.8* 2.5* 2.5* 2.5*   Recent Labs  Lab 09/20/19 0435 09/21/19 0437 09/22/19 0713 09/23/19 0447 09/24/19 0138  LIPASE 3,355* 1,374* 773* 491* 733*   No results for input(s): AMMONIA in the last 168 hours. Coagulation Profile: No results for input(s): INR, PROTIME in the last 168 hours. Cardiac Enzymes: No results for input(s): CKTOTAL,  CKMB, CKMBINDEX, TROPONINI in the last 168 hours. BNP (last 3 results) No results for input(s): PROBNP in the last 8760 hours. HbA1C: No results for input(s): HGBA1C in the last 72 hours. CBG: Recent Labs  Lab 09/23/19 1952 09/24/19 0001 09/24/19 0343 09/24/19 0805 09/24/19 1225  GLUCAP 94 78 123* 90 128*   Lipid Profile: No results for input(s): CHOL, HDL, LDLCALC, TRIG, CHOLHDL, LDLDIRECT in the last 72 hours. Thyroid Function Tests: No results for input(s): TSH, T4TOTAL, FREET4, T3FREE, THYROIDAB in the last 72 hours. Anemia Panel: No results for input(s): VITAMINB12, FOLATE, FERRITIN, TIBC, IRON, RETICCTPCT in the last 72 hours. Sepsis Labs: Recent Labs  Lab 09/20/19 1622 09/20/19 1746  LATICACIDVEN 2.3* 1.8    Recent Results (from the past 240 hour(s))  SARS Coronavirus 2 by RT PCR (hospital order, performed in [facility] hospital lab) Nasopharyngeal Nasopharyngeal Swab     Status: None   Collection Time: 09/20/19  2:12 PM   Specimen: Nasopharyngeal Swab  Result Value Ref Range Status   SARS Coronavirus 2 NEGATIVE NEGATIVE Final    Comment: (NOTE) SARS-CoV-2 target nucleic acids are NOT DETECTED.  The SARS-CoV-2 RNA is generally detectable in upper and lower respiratory specimens during the acute phase of infection. The lowest concentration of SARS-CoV-2 viral copies this assay can detect is 250 copies / mL. A negative result does not preclude SARS-CoV-2 infection and should not be used as the sole basis for treatment or other patient management decisions.  A negative result may occur with improper specimen collection / handling, submission of specimen other than nasopharyngeal swab, presence of viral mutation(s) within the areas targeted by this assay, and inadequate number of viral copies (<250 copies / mL). A negative result must be combined with clinical observations, patient history, and epidemiological information.  Fact Sheet for Patients:     StrictlyIdeas.no  Fact Sheet for Healthcare Providers: BankingDealers.co.za  This test is not yet approved or  cleared by the [place] FDA and has been authorized for detection and/or diagnosis of SARS-CoV-2 by FDA under an Emergency Use Authorization (EUA).  This EUA will remain in effect (meaning this test can be used) for the duration of the COVID-19 declaration under Section 564(b)(1) of the  Act, 21 U.S.C. section 360bbb-3(b)(1), unless the authorization is terminated or revoked sooner.  Performed at [facility], [place] [address], [place], [place] [address]          Radiology Studies: No results found.      Scheduled Meds: . calcitRIOL  1 mcg Oral Q M,W,F-HD  . Chlorhexidine Gluconate Cloth  6 each Topical Q0600  . darbepoetin (ARANESP) injection - DIALYSIS  60 mcg Intravenous Q Thu-HD  . insulin aspart  0-6 Units Subcutaneous Q4H  . metoprolol tartrate  12.5 mg Oral BID  . pantoprazole  40 mg Oral BID   Continuous Infusions: . sodium chloride 75 mL/hr at 09/23/19 0145     LOS: 4 days    Time spent:40 min    Glorimar Stroope, Geraldo Docker, MD [facility] Pager [phone]  If 7PM-7AM, please contact night-coverage [email or web address] Password [facility] 09/24/2019, 2:07 PM

## 2019-09-24 NOTE — Progress Notes (Signed)
[facility] Progress Note   Subjective:   Seen in room.  Feeling good.  No c/os.  Looks like D/C likely today.   Objective Vitals:   09/23/19 0810 09/23/19 1319 09/23/19 1954 09/24/19 0401  BP:  127/69 (!) 167/88 (!) 138/70  Pulse:  78 80 72  Resp:  16 18 18   Temp:  98.3 F (36.8 C) 98.3 F (36.8 C) 98 F (36.7 C)  TempSrc:  Oral Oral Oral  SpO2:  100% 100% 100%  Weight:      Height: 5\' 5"  (1.651 m)      Physical Exam General: elderly man, comfortable Heart: RRR Lungs: clear ant Abdomen: soft, nontender with mod palpation Extremities: trace edema Dialysis Access:  L RC AVF aneurysmal large, +t/b  Additional Objective Labs: Basic Metabolic Panel: Recent Labs  Lab 09/22/19 0713 09/23/19 0447 09/24/19 0138  NA 136 137 136  K 4.3 3.7 3.9  CL 98 102 102  CO2 27 24 24   GLUCOSE 65* 85 144*  BUN 28* 14 21  CREATININE 6.07* 3.72* 5.01*  CALCIUM 7.9* 8.3* 8.5*  PHOS 4.2 2.7 3.3   Liver Function Tests: Recent Labs  Lab 09/22/19 0713 09/23/19 0447 09/24/19 0138  AST 40 30 30  ALT 99* 85* 68*  ALKPHOS 253* 253* 224*  BILITOT 0.9 1.0 0.9  PROT 5.4* 5.6* 5.6*  ALBUMIN 2.5* 2.5* 2.5*   Recent Labs  Lab 09/22/19 0713 09/23/19 0447 09/24/19 0138  LIPASE 773* 491* 733*   CBC: Recent Labs  Lab 09/20/19 1310 09/20/19 1835 09/21/19 0437 09/21/19 1308 09/22/19 0713 09/23/19 0447 09/24/19 0138  WBC 15.3*  --  18.9*  --  9.3 8.8 8.8  NEUTROABS 13.7*  --  17.6*  --  7.4 6.6 6.4  HGB 7.8*   < > 8.3*   < > 7.1* 7.8* 7.3*  HCT 26.5*   < > 27.8*   < > 23.5* 24.9* 23.8*  MCV 91.1  --  90.0  --  87.4 87.1 88.1  PLT 337  --  320  --  263 289 341   < > = values in this interval not displayed.   Blood Culture    Component Value Date/Time   SDES BLOOD RIGHT HAND 12/06/2017 1447   SPECREQUEST  12/06/2017 1447    BOTTLES DRAWN AEROBIC AND ANAEROBIC Blood Culture adequate volume   CULT  12/06/2017 1447    NO GROWTH 5 DAYS Performed at [facility], [place] [address], [place], [place] [address]    REPTSTATUS 12/11/2017 FINAL 12/06/2017 1447    Cardiac Enzymes: No results for input(s): CKTOTAL, CKMB, CKMBINDEX, TROPONINI in the last 168 hours. CBG: Recent Labs  Lab 09/23/19 1548 09/23/19 1952 09/24/19 0001 09/24/19 0343 09/24/19 0805  GLUCAP 143* 94 78 123* 90   Iron Studies: No results for input(s): IRON, TIBC, TRANSFERRIN, FERRITIN in the last 72 hours. @lablastinr3 @ Studies/Results: No results found. Medications: . sodium chloride 75 mL/hr at 09/23/19 0145   . calcitRIOL  1 mcg Oral Q M,W,F-HD  . Chlorhexidine Gluconate Cloth  6 each Topical Q0600  . darbepoetin (ARANESP) injection - DIALYSIS  60 mcg Intravenous Q Thu-HD  . insulin aspart  0-6 Units Subcutaneous Q4H  . metoprolol tartrate  12.5 mg Oral BID  . pantoprazole  40 mg Oral BID    Dialysis Orders: MWF, [place], 4 hours, 2K/2.5 calcium, BFR 450, DFR AF 2.0, EDW 83 kg, UF profile #2, left radiocephalic fistula, 15 G needles, no  heparin, calcitriol 1 mcg 3 times weekly, Mircera 225 mcg IV every 2 weeks, Benadryl 50 mg IV q. HD as needed,  Assessment/Plan: 1.  Acute pancreatitis: No preceding alcohol use and appears to be idiopathic.  Ongoing symptomatic management with bowel rest.  GI plans to f/u outpt and do EGD, ERCP, possible EUS -- suspect biliary etiology. 2.  End-stage renal disease: With missed hemodialysis treatment on Monday/Wednesday of this week.  S/p HD Thurs and now Friday to resume outpt schedule.  HD tomorrow, most likely outpt as for d/c.  Orders for HD here in just in case it falls through.  3.  Hypertension: Likely worsened by volume status, resume oral antihypertensive therapy and monitor with hemodialysis/ultrafiltration today. 4.  Anemia: Hb 7.3 this AM which is recent BL.  Had ESA on admission.  CTM. 5.  Secondary hyperparathyroidism: Calcitriol resumed.  Phos 3.3 today, holding binders for now.  OK for d/c from my perspective.    Appears family requested palliative consult.   Jannifer Hick MD 09/24/2019, 10:24 AM  [facility] Pager: [phone]

## 2019-09-24 NOTE — Progress Notes (Addendum)
[facility] Progress Note  Keawe Marcello 86 y.o. 06/11/1933  CC: Pancreatitis   Subjective: Patient seen and examined at bedside.  Feeling better.  Denies abdominal pain.  Tolerating renal/carb modified diet.  Mild drop in hemoglobin noted.  Patient denies any blood in the stool.  ROS : Afebrile, negative for chest pain   Objective: Vital signs in last 24 hours: Vitals:   09/23/19 1954 09/24/19 0401  BP: (!) 167/88 (!) 138/70  Pulse: 80 72  Resp: 18 18  Temp: 98.3 F (36.8 C) 98 F (36.7 C)  SpO2: 100% 100%    Physical Exam:  General:  Alert, cooperative, no distress, appears stated age  Head:  Normocephalic, without obvious abnormality, atraumatic  Eyes:  , EOM's intact,   Lungs:    No respiratory distress.  Anterior exam only  Heart:  Regular rate and rhythm, S1, S2 normal  Abdomen:   Soft, non-tender, nondistended, bowel sounds present.  Scar marks from previous surgery noted  Extremities: Extremities normal, atraumatic, no  edema       Lab Results: Recent Labs    09/23/19 0447 09/24/19 0138  NA 137 136  K 3.7 3.9  CL 102 102  CO2 24 24  GLUCOSE 85 144*  BUN 14 21  CREATININE 3.72* 5.01*  CALCIUM 8.3* 8.5*  MG 2.2 2.2  PHOS 2.7 3.3   Recent Labs    09/23/19 0447 09/24/19 0138  AST 30 30  ALT 85* 68*  ALKPHOS 253* 224*  BILITOT 1.0 0.9  PROT 5.6* 5.6*  ALBUMIN 2.5* 2.5*   Recent Labs    09/23/19 0447 09/24/19 0138  WBC 8.8 8.8  NEUTROABS 6.6 6.4  HGB 7.8* 7.3*  HCT 24.9* 23.8*  MCV 87.1 88.1  PLT 289 341   No results for input(s): LABPROT, INR in the last 72 hours.    Assessment/Plan: -Pancreatitis.  Suspected biliary etiology.  No alcohol use.  T bili normal. -Anemia with some dark stools.  Could be from duodenitis that was seen on the CT scan. -End-stage renal disease.  On dialysis  Recommendations ------------------------- -Mild drop in hemoglobin noted but no evidence of active bleeding. -Continue IV PPI for  now -Continue supportive care -  If patient remains hospitalized, we may consider inpatient EGD/EUS and ERCP depending on schedule availability on Tuesday.  Otherwise will consider outpatient EGD along with EUS and possible ERCP in the next 2 to 4 weeks. -GI will follow - D/W daughter.    Otis Brace MD, [place] 09/24/2019, 11:06 AM  Contact #  [phone]

## 2019-09-25 LAB — COMPREHENSIVE METABOLIC PANEL
ALT: 59 U/L — ABNORMAL HIGH (ref 0–44)
AST: 23 U/L (ref 15–41)
Albumin: 2.7 g/dL — ABNORMAL LOW (ref 3.5–5.0)
Alkaline Phosphatase: 212 U/L — ABNORMAL HIGH (ref 38–126)
Anion gap: 12 (ref 5–15)
BUN: 31 mg/dL — ABNORMAL HIGH (ref 8–23)
CO2: 24 mmol/L (ref 22–32)
Calcium: 8.7 mg/dL — ABNORMAL LOW (ref 8.9–10.3)
Chloride: 99 mmol/L (ref 98–111)
Creatinine, Ser: 6.7 mg/dL — ABNORMAL HIGH (ref 0.61–1.24)
GFR calc Af Amer: 8 mL/min — ABNORMAL LOW (ref >60)
GFR calc non Af Amer: 7 mL/min — ABNORMAL LOW (ref >60)
Glucose, Bld: 93 mg/dL (ref 70–99)
Potassium: 3.9 mmol/L (ref 3.5–5.1)
Sodium: 135 mmol/L (ref 135–145)
Total Bilirubin: 0.8 mg/dL (ref 0.3–1.2)
Total Protein: 5.8 g/dL — ABNORMAL LOW (ref 6.5–8.1)

## 2019-09-25 LAB — CBC WITH DIFFERENTIAL/PLATELET
Abs Immature Granulocytes: 0.11 10*3/uL — ABNORMAL HIGH (ref 0.00–0.07)
Basophils Absolute: 0 10*3/uL (ref 0.0–0.1)
Basophils Relative: 0 %
Eosinophils Absolute: 0.4 10*3/uL (ref 0.0–0.5)
Eosinophils Relative: 5 %
HCT: 24.8 % — ABNORMAL LOW (ref 39.0–52.0)
Hemoglobin: 7.5 g/dL — ABNORMAL LOW (ref 13.0–17.0)
Immature Granulocytes: 1 %
Lymphocytes Relative: 12 %
Lymphs Abs: 1 10*3/uL (ref 0.7–4.0)
MCH: 26.1 pg (ref 26.0–34.0)
MCHC: 30.2 g/dL (ref 30.0–36.0)
MCV: 86.4 fL (ref 80.0–100.0)
Monocytes Absolute: 0.8 10*3/uL (ref 0.1–1.0)
Monocytes Relative: 9 %
Neutro Abs: 6.4 10*3/uL (ref 1.7–7.7)
Neutrophils Relative %: 73 %
Platelets: 350 10*3/uL (ref 150–400)
RBC: 2.87 MIL/uL — ABNORMAL LOW (ref 4.22–5.81)
RDW: 19.4 % — ABNORMAL HIGH (ref 11.5–15.5)
WBC: 8.8 10*3/uL (ref 4.0–10.5)
nRBC: 0 % (ref 0.0–0.2)

## 2019-09-25 LAB — LIPASE, BLOOD: Lipase: 719 U/L — ABNORMAL HIGH (ref 11–51)

## 2019-09-25 LAB — GLUCOSE, CAPILLARY
Glucose-Capillary: 78 mg/dL (ref 70–99)
Glucose-Capillary: 138 mg/dL — ABNORMAL HIGH (ref 70–99)
Glucose-Capillary: 91 mg/dL (ref 70–99)
Glucose-Capillary: 80 mg/dL (ref 70–99)

## 2019-09-25 LAB — MAGNESIUM: Magnesium: 2.4 mg/dL (ref 1.7–2.4)

## 2019-09-25 LAB — PHOSPHORUS: Phosphorus: 3.5 mg/dL (ref 2.5–4.6)

## 2019-09-25 MED ORDER — INSULIN ASPART 100 UNIT/ML ~~LOC~~ SOLN: 0.0000 [IU] | Freq: Three times a day (TID) | SUBCUTANEOUS | Status: DC

## 2019-09-25 MED ORDER — METOPROLOL TARTRATE 5 MG/5ML IV SOLN
5.0000 mg | Freq: Once | INTRAVENOUS | Status: AC
  Administered 2019-09-25: 5 mg via INTRAVENOUS
  Filled 2019-09-25: qty 5

## 2019-09-25 NOTE — Progress Notes (Signed)
[facility] Progress Note  Subjective: The patient has no major complaints today.  Objective: Vital signs in last 24 hours: Temp:  [98 F (36.7 C)-98.4 F (36.9 C)] 98.4 F (36.9 C) (07/26 0810) Pulse Rate:  [77-81] 77 (07/26 0810) Resp:  [16-18] 18 (07/26 0810) BP: (163-178)/(80-86) 163/82 (07/26 0810) SpO2:  [99 %-100 %] 100 % (07/26 0810) Weight change:    PE:  No distress  Heart regular rhythm  Lungs clear  Abdomen soft and nontender  Lab Results: Results for orders placed or performed during the hospital encounter of 09/20/19 (from the past 24 hour(s))  Glucose, capillary     Status: Abnormal   Collection Time: 09/24/19  4:29 PM  Result Value Ref Range   Glucose-Capillary 129 (H) 70 - 99 mg/dL  Glucose, capillary     Status: Abnormal   Collection Time: 09/24/19  7:53 PM  Result Value Ref Range   Glucose-Capillary 123 (H) 70 - 99 mg/dL  Glucose, capillary     Status: None   Collection Time: 09/24/19 11:55 PM  Result Value Ref Range   Glucose-Capillary 95 70 - 99 mg/dL  Glucose, capillary     Status: None   Collection Time: 09/25/19  4:00 AM  Result Value Ref Range   Glucose-Capillary 78 70 - 99 mg/dL  Comprehensive metabolic panel     Status: Abnormal   Collection Time: 09/25/19  4:21 AM  Result Value Ref Range   Sodium 135 135 - 145 mmol/L   Potassium 3.9 3.5 - 5.1 mmol/L   Chloride 99 98 - 111 mmol/L   CO2 24 22 - 32 mmol/L   Glucose, Bld 93 70 - 99 mg/dL   BUN 31 (H) 8 - 23 mg/dL   Creatinine, Ser 6.70 (H) 0.61 - 1.24 mg/dL   Calcium 8.7 (L) 8.9 - 10.3 mg/dL   Total Protein 5.8 (L) 6.5 - 8.1 g/dL   Albumin 2.7 (L) 3.5 - 5.0 g/dL   AST 23 15 - 41 U/L   ALT 59 (H) 0 - 44 U/L   Alkaline Phosphatase 212 (H) 38 - 126 U/L   Total Bilirubin 0.8 0.3 - 1.2 mg/dL   GFR calc non Af Amer 7 (L) >60 mL/min   GFR calc Af Amer 8 (L) >60 mL/min   Anion gap 12 5 - 15  Magnesium     Status: None   Collection Time: 09/25/19  4:21 AM  Result Value Ref  Range   Magnesium 2.4 1.7 - 2.4 mg/dL  Phosphorus     Status: None   Collection Time: 09/25/19  4:21 AM  Result Value Ref Range   Phosphorus 3.5 2.5 - 4.6 mg/dL  CBC WITH DIFFERENTIAL     Status: Abnormal   Collection Time: 09/25/19  4:21 AM  Result Value Ref Range   WBC 8.8 4.0 - 10.5 K/uL   RBC 2.87 (L) 4.22 - 5.81 MIL/uL   Hemoglobin 7.5 (L) 13.0 - 17.0 g/dL   HCT 24.8 (L) 39 - 52 %   MCV 86.4 80.0 - 100.0 fL   MCH 26.1 26.0 - 34.0 pg   MCHC 30.2 30.0 - 36.0 g/dL   RDW 19.4 (H) 11.5 - 15.5 %   Platelets 350 150 - 400 K/uL   nRBC 0.0 0.0 - 0.2 %   Neutrophils Relative % 73 %   Neutro Abs 6.4 1.7 - 7.7 K/uL   Lymphocytes Relative 12 %   Lymphs Abs 1.0 0.7 - 4.0 K/uL  Monocytes Relative 9 %   Monocytes Absolute 0.8 0 - 1 K/uL   Eosinophils Relative 5 %   Eosinophils Absolute 0.4 0 - 0 K/uL   Basophils Relative 0 %   Basophils Absolute 0.0 0 - 0 K/uL   Immature Granulocytes 1 %   Abs Immature Granulocytes 0.11 (H) 0.00 - 0.07 K/uL  Lipase, blood     Status: Abnormal   Collection Time: 09/25/19  4:21 AM  Result Value Ref Range   Lipase 719 (H) 11 - 51 U/L  Glucose, capillary     Status: Abnormal   Collection Time: 09/25/19  8:06 AM  Result Value Ref Range   Glucose-Capillary 138 (H) 70 - 99 mg/dL  Glucose, capillary     Status: None   Collection Time: 09/25/19 12:01 PM  Result Value Ref Range   Glucose-Capillary 91 70 - 99 mg/dL    Studies/Results: No results found.    Assessment: Pancreatitis.  Suspect biliary etiology.  Lipase still elevated.  Symptomatically improved.  Plan:   I discussed the case with Dr. Paulita Fujita and we will not plan ERCP/EUS this admission but scheduled him as an outpatient in a few weeks.  Dr. Paulita Fujita is aware and we will be in contact with patient for that procedure.    Cassell Clement 09/25/2019, 12:56 PM  Pager: [phone] If no answer or after 5 PM call [phone]

## 2019-09-25 NOTE — TOC Initial Note (Signed)
Transition of Care [facility]) - Initial/Assessment Note    Patient Details  Name: Sean Best MRN: [id number] Date of Birth: 07-Mar-1933  Transition of Care [facility]) CM/SW Contact:    Sean Mt, LCSW Phone Number: 09/25/2019, 11:36 AM  Clinical Narrative:                 CSW met with pt at bedside. Introduced self, role, reason for visit. Pt from home with his daughter Sean Best and granddaughter. Confirmed home address and PCP. Pt family usually transports to dialysis if he doesn't take himself; he states if he is having stomach issues he elects not to go so he can be home and close to the bathroom. He uses an old walker of his wife who has since passed and states he is interested in a new one. He currently has no HH services. He is okay with me scheduling a PCP appointment and calling his daughter.   CSW called [place] at [phone]; introduced self, role, reason for call. She confirms above details. She is interested in home health services (mostly in an aide to see pt a few times after d/c). She doesn't have an agency preference, is okay with me calling to see who can staff case. She is also interested in walker for pt. She inquires about a neurology consult while pt is in the hospital stating that she thinks pt may have the starting signs of dementia. CSW has passed on requests for home services and neurology consult along with pt daughter # to Dr. Sherral Best who is attending.   CSW contacted Sean Rumps, RN liaison with Sean Best who states he will review pt information and should be able to accept referral for [facility] and aide. CSW also contacted Sean Best with Sean Best to order a rolling walker. PCP appointment scheduled w/ Dr. Delfina Best within requested parameters for August 3rd at 2:30pm, added to AVS.   [facility] team available for further support as needed.  Expected Discharge Plan: [place] Barriers to Discharge: Continued Medical Work up   Patient Goals and CMS Choice Patient states their  goals for this hospitalization and ongoing recovery are:: to get home, get a new walker and some home health CMS Medicare.gov Compare Post Acute Care list provided to:: Patient Represenative (must comment) (pt daughter okay with referrals being called by this writer (no preference)) Choice offered to / list presented to : Adult Children, Patient  Expected Discharge Plan and Services Expected Discharge Plan: [facility] In-house Referral: Clinical Social Work Discharge Planning Services: CM Consult, Follow-up appt scheduled Post Acute Care Choice: Durable Medical Equipment, Home Health Living arrangements for the past 2 months: [place]                   DME Agency: [employer] Date DME Agency Contacted: 09/25/19 Time DME Agency Contacted: 0093 Representative spoke with at DME Agency: Sean Best HH Arranged: RN, Nurse's Aide [place] Agency: [employer] Date [place]: 09/25/19 Time [place]: 1134 Representative spoke with at [place]: Sean Best  Prior Living Arrangements/Services Living arrangements for the past 2 months: [place] with:: Relatives, Adult Children Patient language and need for interpreter reviewed:: Yes (no needs) Do you feel safe going back to the place where you live?: Yes      Need for Family Participation in Patient Care: Yes (Comment) (assistance w/ daily cares; transportation) Care giver support system in place?: Yes (comment) (adult children;  relatives (granddaughter and nephew mentioned)) Current home services: DME Criminal Activity/Legal Involvement Pertinent to Current Situation/Hospitalization: No - Comment as needed  Activities of Daily Living      Permission Sought/Granted Permission sought to share information with : Family Supports, PCP Permission granted to share information with : Yes, Verbal Permission Granted  Share Information with NAME: Sean Best  Permission granted  to share info w AGENCY: Dr. Delfina Best  Permission granted to share info w Relationship: daughter  Permission granted to share info w Contact Information: [phone]  Emotional Assessment Appearance:: Appears stated age Attitude/Demeanor/Rapport: Engaged, Gracious Affect (typically observed): Accepting, Adaptable, Appropriate Orientation: : Oriented to Self, Oriented to  Time, Oriented to Place, Oriented to Situation Alcohol / Substance Use: Not Applicable Psych Involvement: No (comment)  Admission diagnosis:  Acute pancreatitis [K85.90] Nausea [R11.0] Epigastric pain [R10.13] ESRD (end stage renal disease) ([place]) [N18.6] Patient Active Problem List   Diagnosis Date Noted  . End-stage renal disease on hemodialysis ([place]) 09/20/2019  . HLD (hyperlipidemia) 09/20/2019  . Prostate cancer ([place]) 09/20/2019  . Umbilical hernia 48/03/6551  . Acute pancreatitis 09/20/2019  . History of noncompliance with medical treatment 09/20/2019  . Diabetes mellitus type 2, uncontrolled, with complications ([place]) 74/82/7078  . Acute GI bleeding 09/20/2019  . Shoulder pain, bilateral 11/22/2018  . Impingement syndrome of right shoulder 11/08/2018  . Hypertensive emergency 10/16/2018  . Hypertensive encephalopathy 10/15/2018  . Left bundle branch block (LBBB) determined by electrocardiography 02/13/2018  . Aortic stenosis 02/13/2018  . NSTEMI (non-ST elevated myocardial infarction) ([place]) 02/13/2018  . Constipation 12/08/2017  . Abdominal pain 12/06/2017  . Leukocytosis 12/06/2017  . Gout 12/06/2017  . Chronic pain 12/06/2017  . Acute on chronic cholecystitis s/p lap cholecystectomy 12/02/2017 12/02/2017  . Cardiac pacemaker in situ (Medtronic DDD) 12/02/2017  . Acute hypoxemic respiratory failure ([place]) 04/18/2017  . Heart block 04/17/2017  . Elevated troponin 04/17/2017  . ESRD on hemodialysis ([place]) 04/12/2016  . H/O: stroke 04/11/2016  . Saccular aneurysm of infrarenal aorta with thrombosed  dissection of abdominal aorta 04/11/2016  . Obesity (BMI 30-39.9) 04/11/2016  . Syncope 04/11/2016  . Prostate CA ([place]) 12/28/2014  . Chest pain 12/28/2014  . C. difficile colitis 10/04/2014  . Hyperkalemia 03/11/2013  . Anemia 03/11/2013  . DM (diabetes mellitus), type 2 with renal complications, diet controlled 03/11/2013  . HTN (hypertension) 03/11/2013  . Hyperlipidemia 03/11/2013  . ESRD on dialysis ([place]) 03/20/2011   PCP:  Seward Carol, MD Pharmacy:   [id number] [place] [place]Phone: [phone]: [phone] [place] [address]Phone: [phone]: [phone] Readmission Risk Interventions Readmission Risk Prevention Plan 09/25/2019  Transportation Screening Complete  Medication Review (RN Care Manager) Referral to Pharmacy  PCP or Specialist appointment within 3-5 days of discharge Complete  HRI or HWhatcomComplete  SW Recovery Care/Counseling Consult Complete  P[place]ulah BeachNot Applicable  Some recent data might be hidden

## 2019-09-25 NOTE — Progress Notes (Signed)
PROGRESS NOTE    Sean Best  TKW:[id number] DOB: 1933/11/18 DOA: 09/20/2019 PCP: Seward Carol, MD     Brief Narrative:  Sean Best is a 86 y.o. male BM PMHx CVA, ESRD on HD M/W/F, HTN, s/p permanent cardiac pacemaker, HLD, prostate cancer, umbilical hernia.  Presents with chief complaint of abdominal pain, nausea, vomiting and diarrhea since Friday. Patient explains the pain started onFriday after his dialysis treatment. He hasfelt nauseated and epigastric pain that now has turned into diffuse abdominal pain. Describes the pain as a constant throbbing pain denies alleviating factors and admits that food tends to make the pain worse. He admits to vomiting multiple times, denies seeing any blood in his vomit. Patient admits that he is has had frequent diarrhea without noticing blood or dark tarry stools. Patient is a known diabetic, has no history of gallstones or pancreatitis. Patient has past surgical history ofcholeyectomy and hernia repair. Patient has a medical history of arthritis, end-stage renal disease on dialysis, hyperlipidemia, hypertension, type 2 diabetes,. Patient denies headache, congestion, sore throat, chest pain, shortness of breath, dysuria, leg pain.   Subjective: 7/26 afebrile overnight A/O x4, negative CP, negative S OB, negative postprandial pain.  Negative diarrhea.   Assessment & Plan: Covid vaccination;   Principal Problem:   Acute pancreatitis Active Problems:   HTN (hypertension)   Heart block   Cardiac pacemaker in situ (Medtronic DDD)   End-stage renal disease on hemodialysis ([place])   HLD (hyperlipidemia)   Prostate cancer ([place])   Umbilical hernia   History of noncompliance with medical treatment   Diabetes mellitus type 2, uncontrolled, with complications ([place])   Acute GI bleeding   Acute pancreatitis -Appears to be idiopathic.  Patient states negative consumption of EtOH -7/21 EtOH negative -Urine tox screen; patient makes no  urine at all.  -7/22 normal saline 75 ml/hr. monitor for fluid overload. Recent Labs  Lab 09/21/19 0437 09/22/19 0713 09/23/19 0447 09/24/19 0138 09/25/19 0421  LIPASE 1,374* 773* 491* 733* 719*  -Per GI may start sips of clear liquid. -Per GI may require EGD/EUS once patient's pancreas has defervesced. -Dilaudid PRN abdominal pain -7/25 lipase trending up -7/25 per [facility] note they plan on performing EGD/EUS on Tuesday if patient remains hospitalized otherwise on an  outpatient basis after patient has recovered.    Essential HTN -Hydralazine IV PRN -7/24 change Metoprolol 12.5 mg BID -7/25 if patient has BP still elevated after HD will place him on scheduled hydralazine  Heart block s/p cardiac pacemaker -Stable  ESRD on HD M/W/F -7/26 currently patient in HD.  DM type II controlled with complications -9/24 hemoglobin A1c= 4.2 -Very sensitive SSI  HLD -7/21 LDL= 69  Noncompliance with medical treatment -Per patient secondary to significant diarrhea is why he missed his HD on Monday.  Umbilical hernia -Reducible no indication of entrapment  Acute GI bleed (baseline Hg 9.5) -7/21 heme positive -ED contacted GI who will see patient in a.m. -7/22 DC H/H q 6 hr hemoglobin stabilized -Do not want to start Protonix drip outpatient secondary to fluid load and not receiving HD until a.m. -7/23 change Protonix po 40 mg BID -Transfuse for hemoglobin<7 -7/21 transfuse 1 unit PRBC Recent Labs  Lab 09/21/19 2305 09/22/19 0713 09/23/19 0447 09/24/19 0138 09/25/19 0421  HGB 6.8* 7.1* 7.8* 7.3* 7.5*  -7/26 stable will discharge in A.m.  Prostate cancer -Review of patient's chart does not appear to have anything recent.  We will have to inquire of  patient in  the a.m.  Goals of care -7/26 face-to-face place; requesting PT, home health aide   DVT prophylaxis: SCD Code Status: Full Family Communication:  Status is: Inpatient    Dispo: The patient is  from: Home              Anticipated d/c is to: Home              Anticipated d/c date is: 7/27              Patient currently unstable      Consultants:  [facility] Dr. Arta Silence Nephrology   Procedures/Significant Events:    I have personally reviewed and interpreted all radiology studies and my findings are as above.  VENTILATOR SETTINGS:    Cultures   Antimicrobials:    Devices    LINES / TUBES:      Continuous Infusions: . sodium chloride 75 mL/hr at 09/23/19 0145     Objective: Vitals:   09/24/19 2105 09/24/19 2243 09/25/19 0543 09/25/19 0810  BP: (!) 178/80 (!) 167/84 (!) 176/86 (!) 163/82  Pulse: 81 77 81 77  Resp: 17 17 17 18   Temp: 98.3 F (36.8 C) 98.4 F (36.9 C) 98.3 F (36.8 C) 98.4 F (36.9 C)  TempSrc:    Oral  SpO2: 99% 100% 100% 100%  Weight:      Height:        Intake/Output Summary (Last 24 hours) at 09/25/2019 1220 Last data filed at 09/25/2019 0500 Gross per 24 hour  Intake 660 ml  Output --  Net 660 ml   Filed Weights   09/21/19 1100 09/22/19 0735 09/22/19 1152  Weight: 83.6 kg 86.1 kg 83.1 kg   Physical Exam:  General: A/O x4, No acute respiratory distress Eyes: negative scleral hemorrhage, negative anisocoria, negative icterus ENT: Negative Runny nose, negative gingival bleeding, Neck:  Negative scars, masses, torticollis, lymphadenopathy, JVD Lungs: Clear to auscultation bilaterally without wheezes or crackles Cardiovascular: Regular rate and rhythm without murmur gallop or rub normal S1 and S2 Abdomen: negative abdominal pain, nondistended, positive soft, bowel sounds, no rebound, no ascites, no appreciable mass Extremities: No significant cyanosis, clubbing, or edema bilateral lower extremities Skin: Negative rashes, lesions, ulcers Psychiatric:  Negative depression, negative anxiety, negative fatigue, negative mania  Central nervous system:  Cranial nerves II through XII intact, tongue/uvula midline, all  extremities muscle strength 5/5, sensation intact throughout,  negative dysarthria, negative expressive aphasia, negative receptive aphasia.  .     Data Reviewed: Care during the described time interval was provided by me .  I have reviewed this patient's available data, including medical history, events of note, physical examination, and all test results as part of my evaluation.  CBC: Recent Labs  Lab 09/21/19 0437 09/21/19 1308 09/21/19 2305 09/22/19 0713 09/23/19 0447 09/24/19 0138 09/25/19 0421  WBC 18.9*  --   --  9.3 8.8 8.8 8.8  NEUTROABS 17.6*  --   --  7.4 6.6 6.4 6.4  HGB 8.3*   < > 6.8* 7.1* 7.8* 7.3* 7.5*  HCT 27.8*   < > 22.5* 23.5* 24.9* 23.8* 24.8*  MCV 90.0  --   --  87.4 87.1 88.1 86.4  PLT 320  --   --  263 289 341 350   < > = values in this interval not displayed.   Basic Metabolic Panel: Recent Labs  Lab 09/21/19 0437 09/22/19 0713 09/23/19 0447 09/24/19 0138 09/25/19 0421  NA 138 136 137 136 135  K 4.6 4.3 3.7 3.9 3.9  CL 95* 98 102 102 99  CO2 29 27 24 24 24   GLUCOSE 150* 65* 85 144* 93  BUN 48* 28* 14 21 31*  CREATININE 10.61* 6.07* 3.72* 5.01* 6.70*  CALCIUM 7.8* 7.9* 8.3* 8.5* 8.7*  MG 3.3* 2.7* 2.2 2.2 2.4  PHOS 6.1* 4.2 2.7 3.3 3.5   GFR: Estimated Creatinine Clearance: 8.1 mL/min (A) (by C-G formula based on SCr of 6.7 mg/dL (H)). Liver Function Tests: Recent Labs  Lab 09/21/19 0437 09/22/19 0713 09/23/19 0447 09/24/19 0138 09/25/19 0421  AST 75* 40 30 30 23   ALT 144* 99* 85* 68* 59*  ALKPHOS 335* 253* 253* 224* 212*  BILITOT 3.3* 0.9 1.0 0.9 0.8  PROT 6.0* 5.4* 5.6* 5.6* 5.8*  ALBUMIN 2.8* 2.5* 2.5* 2.5* 2.7*   Recent Labs  Lab 09/21/19 0437 09/22/19 0713 09/23/19 0447 09/24/19 0138 09/25/19 0421  LIPASE 1,374* 773* 491* 733* 719*   No results for input(s): AMMONIA in the last 168 hours. Coagulation Profile: No results for input(s): INR, PROTIME in the last 168 hours. Cardiac Enzymes: No results for input(s):  CKTOTAL, CKMB, CKMBINDEX, TROPONINI in the last 168 hours. BNP (last 3 results) No results for input(s): PROBNP in the last 8760 hours. HbA1C: No results for input(s): HGBA1C in the last 72 hours. CBG: Recent Labs  Lab 09/24/19 1953 09/24/19 2355 09/25/19 0400 09/25/19 0806 09/25/19 1201  GLUCAP 123* 95 78 138* 91   Lipid Profile: No results for input(s): CHOL, HDL, LDLCALC, TRIG, CHOLHDL, LDLDIRECT in the last 72 hours. Thyroid Function Tests: No results for input(s): TSH, T4TOTAL, FREET4, T3FREE, THYROIDAB in the last 72 hours. Anemia Panel: No results for input(s): VITAMINB12, FOLATE, FERRITIN, TIBC, IRON, RETICCTPCT in the last 72 hours. Sepsis Labs: Recent Labs  Lab 09/20/19 1622 09/20/19 1746  LATICACIDVEN 2.3* 1.8    Recent Results (from the past 240 hour(s))  SARS Coronavirus 2 by RT PCR (hospital order, performed in [facility] hospital lab) Nasopharyngeal Nasopharyngeal Swab     Status: None   Collection Time: 09/20/19  2:12 PM   Specimen: Nasopharyngeal Swab  Result Value Ref Range Status   SARS Coronavirus 2 NEGATIVE NEGATIVE Final    Comment: (NOTE) SARS-CoV-2 target nucleic acids are NOT DETECTED.  The SARS-CoV-2 RNA is generally detectable in upper and lower respiratory specimens during the acute phase of infection. The lowest concentration of SARS-CoV-2 viral copies this assay can detect is 250 copies / mL. A negative result does not preclude SARS-CoV-2 infection and should not be used as the sole basis for treatment or other patient management decisions.  A negative result may occur with improper specimen collection / handling, submission of specimen other than nasopharyngeal swab, presence of viral mutation(s) within the areas targeted by this assay, and inadequate number of viral copies (<250 copies / mL). A negative result must be combined with clinical observations, patient history, and epidemiological information.  Fact Sheet for Patients:     StrictlyIdeas.no  Fact Sheet for Healthcare Providers: BankingDealers.co.za  This test is not yet approved or  cleared by the [place] FDA and has been authorized for detection and/or diagnosis of SARS-CoV-2 by FDA under an Emergency Use Authorization (EUA).  This EUA will remain in effect (meaning this test can be used) for the duration of the COVID-19 declaration under Section 564(b)(1) of the Act, 21 U.S.C. section 360bbb-3(b)(1), unless the authorization is terminated or revoked sooner.  Performed at [facility], [address] [place], [place] [address]          Radiology Studies: No results found.      Scheduled Meds: . calcitRIOL  1 mcg Oral Q M,W,F-HD  . Chlorhexidine Gluconate Cloth  6 each Topical Q0600  . darbepoetin (ARANESP) injection - DIALYSIS  60 mcg Intravenous Q Thu-HD  . insulin aspart  0-6 Units Subcutaneous Q4H  . metoprolol tartrate  12.5 mg Oral BID  . pantoprazole  40 mg Oral BID  . polyethylene glycol  17 g Oral Daily   Continuous Infusions: . sodium chloride 75 mL/hr at 09/23/19 0145     LOS: 5 days    Time spent:40 min    Jalayne Ganesh, Geraldo Docker, MD [facility] Pager [phone]  If 7PM-7AM, please contact night-coverage [email or web address] Password [facility] 09/25/2019, 12:20 PM

## 2019-09-25 NOTE — Progress Notes (Signed)
[facility] Progress Note   Subjective:   Patient seen and examined in room.  Sitting in bedside chair, just finished breakfast, tolerated well.  Denies n/v/d, abdominal pain, SOB, CP, weakness and fatigue.    Objective Vitals:   09/24/19 2105 09/24/19 2243 09/25/19 0543 09/25/19 0810  BP: (!) 178/80 (!) 167/84 (!) 176/86 (!) 163/82  Pulse: 81 77 81 77  Resp: 17 17 17 18   Temp: 98.3 F (36.8 C) 98.4 F (36.9 C) 98.3 F (36.8 C) 98.4 F (36.9 C)  TempSrc:    Oral  SpO2: 99% 100% 100% 100%  Weight:      Height:       Physical Exam General:WDWN male in NAD Heart:RRR, +7/6 systolic murmur Lungs:CTAB Abdomen:soft, NTND Extremities:2+ LE edema Dialysis Access: LU AVF, +b   Filed Weights   09/21/19 1100 09/22/19 0735 09/22/19 1152  Weight: 83.6 kg 86.1 kg 83.1 kg    Intake/Output Summary (Last 24 hours) at 09/25/2019 0851 Last data filed at 09/25/2019 0500 Gross per 24 hour  Intake 780 ml  Output --  Net 780 ml    Additional Objective Labs: Basic Metabolic Panel: Recent Labs  Lab 09/23/19 0447 09/24/19 0138 09/25/19 0421  NA 137 136 135  K 3.7 3.9 3.9  CL 102 102 99  CO2 24 24 24   GLUCOSE 85 144* 93  BUN 14 21 31*  CREATININE 3.72* 5.01* 6.70*  CALCIUM 8.3* 8.5* 8.7*  PHOS 2.7 3.3 3.5   Liver Function Tests: Recent Labs  Lab 09/23/19 0447 09/24/19 0138 09/25/19 0421  AST 30 30 23   ALT 85* 68* 59*  ALKPHOS 253* 224* 212*  BILITOT 1.0 0.9 0.8  PROT 5.6* 5.6* 5.8*  ALBUMIN 2.5* 2.5* 2.7*   Recent Labs  Lab 09/23/19 0447 09/24/19 0138 09/25/19 0421  LIPASE 491* 733* 719*   CBC: Recent Labs  Lab 09/21/19 0437 09/21/19 1308 09/22/19 0713 09/22/19 0713 09/23/19 0447 09/24/19 0138 09/25/19 0421  WBC 18.9*  --  9.3   < > 8.8 8.8 8.8  NEUTROABS 17.6*  --  7.4   < > 6.6 6.4 6.4  HGB 8.3*   < > 7.1*   < > 7.8* 7.3* 7.5*  HCT 27.8*   < > 23.5*   < > 24.9* 23.8* 24.8*  MCV 90.0  --  87.4  --  87.1 88.1 86.4  PLT 320  --  263   <  > 289 341 350   < > = values in this interval not displayed.    Recent Labs  Lab 09/24/19 1629 09/24/19 1953 09/24/19 2355 09/25/19 0400 09/25/19 0806  GLUCAP 129* 123* 95 78 138*   Iron Studies: No results for input(s): IRON, TIBC, TRANSFERRIN, FERRITIN in the last 72 hours. Lab Results  Component Value Date   INR 1.10 02/10/2018   INR 1.77 04/18/2017   INR 1.39 12/29/2014    Medications: . sodium chloride 75 mL/hr at 09/23/19 0145   . calcitRIOL  1 mcg Oral Q M,W,F-HD  . Chlorhexidine Gluconate Cloth  6 each Topical Q0600  . darbepoetin (ARANESP) injection - DIALYSIS  60 mcg Intravenous Q Thu-HD  . insulin aspart  0-6 Units Subcutaneous Q4H  . metoprolol tartrate  12.5 mg Oral BID  . pantoprazole  40 mg Oral BID  . polyethylene glycol  17 g Oral Daily    Dialysis Orders: MWF, [place], 4 hours, 2K/2.5 calcium, BFR 450, DFR AF 2.0, EDW 83 kg, UF profile #2, left radiocephalic  fistula, 15 G needles, no heparin, calcitriol 1 mcg 3 times weekly, Mircera 225 mcg IV every 2 weeks, Benadryl 50 mg IV q. HD as needed  Assessment/Plan: 1. Acute pancreatitis - No ETOH use, appears to be idiopathic.  Tolerating breakfast this AM.  Denies pain.  GI plans f/u as OP with EGD, ERCP, possible EUS - biliary etiology suspected.  2. ESRD -on HD MWF.  HD today per regular schedule. Can be completed as OP if d/c earlier enough this AM.  K 3.9.  3. Anemia of CKD- Hgb 7.5 this AM.  Had ESA on admission.  4. Secondary hyperparathyroidism - CCa in goal.  Phos 3.5.  Binders have been held during stay.  Recheck phos at OP center.  5. HTN/volume - BP elevated, +edema on exam.  Getting under dry as OP, likely needs to be lowered.  UF as tolerated.  6. Nutrition - Renal diet w/fluid restrictions. Alb 2.5.  7. Dispo - ok for d/c from renal prospective.   Jen Mow, PA-C [facility] 09/25/2019,8:51 AM  LOS: 5 days

## 2019-09-25 NOTE — Progress Notes (Signed)
Pt's BP this morning is 176/86. Asymptomatic, notified MD on call. New orders received. Will monitor pt.

## 2019-09-25 NOTE — Procedures (Signed)
Patient seen on Hemodialysis. BP (!) 163/82 (BP Location: Right Arm)   Pulse 77   Temp 98.4 F (36.9 C) (Oral)   Resp 18   Ht 5\' 5"  (1.651 m)   Wt 83.1 kg   SpO2 100%   BMI 30.49 kg/m   QB 400, UF goal 4L Tolerating treatment without complaints at this time.  Elmarie Shiley MD [facility]. Office # [phone] 2:37 PM

## 2019-09-26 LAB — GLUCOSE, CAPILLARY
Glucose-Capillary: 96 mg/dL (ref 70–99)
Glucose-Capillary: 160 mg/dL — ABNORMAL HIGH (ref 70–99)

## 2019-09-26 LAB — CBC WITH DIFFERENTIAL/PLATELET
Abs Immature Granulocytes: 0.14 10*3/uL — ABNORMAL HIGH (ref 0.00–0.07)
Basophils Absolute: 0 10*3/uL (ref 0.0–0.1)
Basophils Relative: 0 %
Eosinophils Absolute: 0.3 10*3/uL (ref 0.0–0.5)
Eosinophils Relative: 4 %
HCT: 24.5 % — ABNORMAL LOW (ref 39.0–52.0)
Hemoglobin: 7.5 g/dL — ABNORMAL LOW (ref 13.0–17.0)
Immature Granulocytes: 2 %
Lymphocytes Relative: 11 %
Lymphs Abs: 1 10*3/uL (ref 0.7–4.0)
MCH: 26.4 pg (ref 26.0–34.0)
MCHC: 30.6 g/dL (ref 30.0–36.0)
MCV: 86.3 fL (ref 80.0–100.0)
Monocytes Absolute: 0.9 10*3/uL (ref 0.1–1.0)
Monocytes Relative: 10 %
Neutro Abs: 6.6 10*3/uL (ref 1.7–7.7)
Neutrophils Relative %: 73 %
Platelets: 357 10*3/uL (ref 150–400)
RBC: 2.84 MIL/uL — ABNORMAL LOW (ref 4.22–5.81)
RDW: 19.3 % — ABNORMAL HIGH (ref 11.5–15.5)
WBC: 9 10*3/uL (ref 4.0–10.5)
nRBC: 0 % (ref 0.0–0.2)

## 2019-09-26 LAB — CALCIUM: Calcium: 8.5 mg/dL — ABNORMAL LOW (ref 8.9–10.3)

## 2019-09-26 LAB — MAGNESIUM: Magnesium: 2 mg/dL (ref 1.7–2.4)

## 2019-09-26 LAB — LIPASE, BLOOD: Lipase: 739 U/L — ABNORMAL HIGH (ref 11–51)

## 2019-09-26 LAB — PHOSPHORUS: Phosphorus: 2.6 mg/dL (ref 2.5–4.6)

## 2019-09-26 MED ORDER — CALCITRIOL 0.5 MCG PO CAPS: 1.0000 ug | ORAL_CAPSULE | ORAL | 0 refills | Status: AC

## 2019-09-26 MED ORDER — CHLORHEXIDINE GLUCONATE CLOTH 2 % EX PADS
6.0000 | MEDICATED_PAD | Freq: Every day | CUTANEOUS | Status: DC
  Administered 2019-09-26: 6 via TOPICAL

## 2019-09-26 MED ORDER — DARBEPOETIN ALFA 60 MCG/0.3ML IJ SOSY: 60.0000 ug | PREFILLED_SYRINGE | INTRAMUSCULAR | 0 refills | Status: AC

## 2019-09-26 MED ORDER — METOPROLOL TARTRATE 25 MG PO TABS: 12.5000 mg | ORAL_TABLET | Freq: Two times a day (BID) | ORAL | 0 refills | Status: AC

## 2019-09-26 MED FILL — METOPROLOL TARTRATE 25 MG T: 25 | 60 days supply | Qty: 60 | Fill #0

## 2019-09-26 NOTE — Care Management Important Message (Signed)
Important Message  Patient Details  Name: Sean Best MRN: [id number] Date of Birth: [birth year]   Medicare Important Message Given:  Yes     Artin Mceuen [address] 09/26/2019, 3:34 PM

## 2019-09-26 NOTE — Plan of Care (Signed)

## 2019-09-26 NOTE — Progress Notes (Signed)
[facility] Progress Note   Subjective:   Patient seen and examined at bedside.  Feeling well.  No specific complaints.  Tolerated dialysis well yesterday.  Denies fever, chills, n/v/d, abdominal pain, CP, SOB, weakness, dizziness and fatigue.   Objective Vitals:   09/25/19 1810 09/25/19 2010 09/26/19 0423 09/26/19 0514  BP: 113/76 (!) 135/83 (!) 170/93 (!) 154/76  Pulse: 76 96 84 86  Resp: 17 16 16    Temp: 98.2 F (36.8 C) 99.2 F (37.3 C) 100.1 F (37.8 C)   TempSrc: Oral Oral Oral   SpO2: 99% 100% 100%   Weight: 85.2 kg     Height:       Physical Exam General: WDWN male in NAD Heart:RRR, +2/8 systolic murmur Lungs:CTAB, nml WOB Abdomen:soft, NTND Extremities:1+ LE edema Dialysis Access: LU AVF +b   Filed Weights   09/22/19 1152 09/25/19 1400 09/25/19 1810  Weight: 83.1 kg 89 kg 85.2 kg    Intake/Output Summary (Last 24 hours) at 09/26/2019 0918 Last data filed at 09/26/2019 0320 Gross per 24 hour  Intake 1554.68 ml  Output --  Net 1554.68 ml    Additional Objective Labs: Basic Metabolic Panel: Recent Labs  Lab 09/23/19 0447 09/23/19 0447 09/24/19 0138 09/25/19 0421 09/26/19 0252  NA 137  --  136 135  --   K 3.7  --  3.9 3.9  --   CL 102  --  102 99  --   CO2 24  --  24 24  --   GLUCOSE 85  --  144* 93  --   BUN 14  --  21 31*  --   CREATININE 3.72*  --  5.01* 6.70*  --   CALCIUM 8.3*   < > 8.5* 8.7* 8.5*  PHOS 2.7   < > 3.3 3.5 2.6   < > = values in this interval not displayed.   Liver Function Tests: Recent Labs  Lab 09/23/19 0447 09/24/19 0138 09/25/19 0421  AST 30 30 23   ALT 85* 68* 59*  ALKPHOS 253* 224* 212*  BILITOT 1.0 0.9 0.8  PROT 5.6* 5.6* 5.8*  ALBUMIN 2.5* 2.5* 2.7*   Recent Labs  Lab 09/24/19 0138 09/25/19 0421 09/26/19 0252  LIPASE 733* 719* 739*   CBC: Recent Labs  Lab 09/22/19 0713 09/22/19 0713 09/23/19 0447 09/23/19 0447 09/24/19 0138 09/25/19 0421 09/26/19 0252  WBC 9.3   < > 8.8   < > 8.8  8.8 9.0  NEUTROABS 7.4   < > 6.6   < > 6.4 6.4 6.6  HGB 7.1*   < > 7.8*   < > 7.3* 7.5* 7.5*  HCT 23.5*   < > 24.9*   < > 23.8* 24.8* 24.5*  MCV 87.4  --  87.1  --  88.1 86.4 86.3  PLT 263   < > 289   < > 341 350 357   < > = values in this interval not displayed.   CBG: Recent Labs  Lab 09/25/19 0400 09/25/19 0806 09/25/19 1201 09/25/19 2013 09/26/19 0726  GLUCAP 78 138* 91 80 96    Medications: . sodium chloride Stopped (09/23/19 2018)   . calcitRIOL  1 mcg Oral Q M,W,F-HD  . Chlorhexidine Gluconate Cloth  6 each Topical Q0600  . darbepoetin (ARANESP) injection - DIALYSIS  60 mcg Intravenous Q Thu-HD  . insulin aspart  0-6 Units Subcutaneous TID AC & HS  . metoprolol tartrate  12.5 mg Oral BID  . pantoprazole  40 mg Oral BID  . polyethylene glycol  17 g Oral Daily    Dialysis Orders: MWF, [facility], 4 hours, 2K/2.5 calcium, BFR 450, DFR AF 2.0, EDW 83 kg, UF profile #2, left radiocephalic fistula, 15 G needles, no heparin, calcitriol 1 mcg 3 times weekly, Mircera 225 mcg IV every 2 weeks, Benadryl 50 mg IV q. HD as needed  Assessment/Plan: 1. Acute pancreatitis - No ETOH use, appears to be idiopathic.  Tolerating breakfast this AM.  Denies pain.  GI plans f/u as OP with EGD, ERCP, possible EUS - biliary etiology suspected. Per GI/primary 2. ESRD -on HD MWF.  HD yesterday tolerated well.  Next HD 7/27, can be completed as OP if d/c today or early tomorrow AM.  Will write orders for 2nd shift tomorrow in case remains inpatient. last K 3.9, use increase K bath.  3. Anemia of CKD- Hgb 7.5 this AM.  aranesp 85mcg given 7/22.  transfuse prn.  4. Secondary hyperparathyroidism - CCa in goal.  Phos 2.6.  Binders have been held during stay.  Recheck phos at OP center.  5. HTN/volume - BP elevated, +edema on exam.  net UF 4L removed yesterday, 2L over dry if weights correct.  Losing weight, lower dry on d/c. UF as tolerated.  6. Nutrition - Renal diet w/fluid restrictions. Alb 2.7.  Protein supplements.  7. Dispo - ok for d/c from renal prospective.   Jen Mow, PA-C [facility] 09/26/2019,9:18 AM  LOS: 6 days

## 2019-09-26 NOTE — Progress Notes (Signed)
[facility] Progress Note  Subjective: The patient is doing well today.  He has no complaints of abdominal pain.  He is eating solid food.  Objective: Vital signs in last 24 hours: Temp:  [98.2 F (36.8 C)-100.1 F (37.8 C)] 100.1 F (37.8 C) (07/27 0423) Pulse Rate:  [76-96] 86 (07/27 0514) Resp:  [15-24] 16 (07/27 0423) BP: (113-170)/(76-93) 154/76 (07/27 0514) SpO2:  [99 %-100 %] 100 % (07/27 0423) Weight:  [85.2 kg-89 kg] 85.2 kg (07/26 1810) Weight change:    PE:  No distress  Heart regular rhythm  Abdomen soft and nontender  Lab Results: Results for orders placed or performed during the hospital encounter of 09/20/19 (from the past 24 hour(s))  Glucose, capillary     Status: None   Collection Time: 09/25/19 12:01 PM  Result Value Ref Range   Glucose-Capillary 91 70 - 99 mg/dL  Glucose, capillary     Status: None   Collection Time: 09/25/19  8:13 PM  Result Value Ref Range   Glucose-Capillary 80 70 - 99 mg/dL  Magnesium     Status: None   Collection Time: 09/26/19  2:52 AM  Result Value Ref Range   Magnesium 2.0 1.7 - 2.4 mg/dL  Calcium     Status: Abnormal   Collection Time: 09/26/19  2:52 AM  Result Value Ref Range   Calcium 8.5 (L) 8.9 - 10.3 mg/dL  Phosphorus     Status: None   Collection Time: 09/26/19  2:52 AM  Result Value Ref Range   Phosphorus 2.6 2.5 - 4.6 mg/dL  CBC WITH DIFFERENTIAL     Status: Abnormal   Collection Time: 09/26/19  2:52 AM  Result Value Ref Range   WBC 9.0 4.0 - 10.5 K/uL   RBC 2.84 (L) 4.22 - 5.81 MIL/uL   Hemoglobin 7.5 (L) 13.0 - 17.0 g/dL   HCT 24.5 (L) 39 - 52 %   MCV 86.3 80.0 - 100.0 fL   MCH 26.4 26.0 - 34.0 pg   MCHC 30.6 30.0 - 36.0 g/dL   RDW 19.3 (H) 11.5 - 15.5 %   Platelets 357 150 - 400 K/uL   nRBC 0.0 0.0 - 0.2 %   Neutrophils Relative % 73 %   Neutro Abs 6.6 1.7 - 7.7 K/uL   Lymphocytes Relative 11 %   Lymphs Abs 1.0 0.7 - 4.0 K/uL   Monocytes Relative 10 %   Monocytes Absolute 0.9 0 - 1  K/uL   Eosinophils Relative 4 %   Eosinophils Absolute 0.3 0 - 0 K/uL   Basophils Relative 0 %   Basophils Absolute 0.0 0 - 0 K/uL   Immature Granulocytes 2 %   Abs Immature Granulocytes 0.14 (H) 0.00 - 0.07 K/uL  Lipase, blood     Status: Abnormal   Collection Time: 09/26/19  2:52 AM  Result Value Ref Range   Lipase 739 (H) 11 - 51 U/L  Glucose, capillary     Status: None   Collection Time: 09/26/19  7:26 AM  Result Value Ref Range   Glucose-Capillary 96 70 - 99 mg/dL    Studies/Results: No results found.    Assessment: Pancreatitis most likely of biliary origin.  Persistent lipase elevation most likely related to decreased renal clearance.  Plan:   Clinically he seems to be very stable at this point and can probably be discharged from a GI standpoint.  Dr. Paulita Fujita will contact him via our office for outpatient EUS/ERCP in the next few weeks.  We will sign off.  Call us if needed.    SAM F Rue Valladares 09/26/2019, 9:15 AM  Pager: [phone] If no answer or after 5 PM call [phone]

## 2019-09-26 NOTE — Discharge Summary (Signed)
Physician Discharge Summary  Bevin Mayall SNK:[id number] DOB: 1934/01/07 DOA: 09/20/2019  PCP: Seward Carol, MD  Admit date: 09/20/2019 Discharge date: 09/26/2019  Time spent: 30 minutes  Recommendations for Outpatient Follow-up:   Covid vaccination; positive vaccination  Acute pancreatitis -Appears to be idiopathic. Patient states negative consumption of EtOH -7/21 EtOH negative -Urine tox screen; patient makes no urine at all.  -7/22 normal saline 75 ml/hr. monitor for fluid overload. Recent Labs  Lab 09/22/19 0713 09/23/19 0447 09/24/19 0138 09/25/19 0421 09/26/19 0252  LIPASE 773* 491* 733* 719* 739*  -Per GI may start sips of clear liquid. -Per GI may require EGD/EUS once patient's pancreas has defervesced. -Patient pain-free -7/27 lipase more or less stable most likely secondary to lack of renal clearance -7/27 per [facility] note Dr. Paulita Fujita will contact him via our office for outpatient EUS/ERCP in the next few weeks    Essential HTN -7/24 change Metoprolol 12.5 mg BID -7/27 amlodipine 5 mg daily.  Home medication  Heart block s/p cardiac pacemaker -Stable  ESRD onHD M/W/F -Restart normal schedule.  DM type II controlled with complications -9/[id number] A1c=4.2 -Home meds  HLD -7/21LDL=69  Noncompliance with medical treatment -Per patient secondary to significant diarrhea is why he missed his HD on Monday. -Diarrhea resolved  Umbilical hernia -Reducible no indication of entrapment  Acute GI bleed(baseline Hg9.5) -7/21 heme positive -ED contacted GI who will see patient in a.m. -7/22 DC H/Hq 6 hr hemoglobin stabilized -Do not want to start Protonix drip outpatient secondary to fluid load and not receiving HD until a.m. -7/23 change Protonix po 40 mgBID -Transfuse for hemoglobin<7 -7/21transfuse1 unit PRBC Recent Labs  Lab 09/22/19 0713 09/23/19 0447 09/24/19 0138 09/25/19 0421 09/26/19 0252  HGB 7.1* 7.8* 7.3* 7.5* 7.5*   -Stable   Prostate cancer -Review of patient's chart does not appear to have anything recent. We will have to inquire ofpatient in the a.m.  Goals of care -7/26 face-to-face place; requesting PT, home health aide   Discharge Diagnoses:  Principal Problem:   Acute pancreatitis Active Problems:   HTN (hypertension)   Heart block   Cardiac pacemaker in situ ([employer] DDD)   End-stage renal disease on hemodialysis ([place])   HLD (hyperlipidemia)   Prostate cancer ([place])   Umbilical hernia   History of noncompliance with medical treatment   Diabetes mellitus type 2, uncontrolled, with complications ([place])   Acute GI bleeding   Discharge Condition: Stable  Diet recommendation: Renal diet  Filed Weights   09/22/19 1152 09/25/19 1400 09/25/19 1810  Weight: 83.1 kg 89 kg 85.2 kg    History of present illness:  Sean Murphyis a 86 y.o.maleBM PMHxCVA, ESRD onHD M/W/F, HTN, s/p permanent cardiac pacemaker, HLD, prostate cancer, umbilical hernia.  Akins chief complaint of abdominal pain, nausea, vomiting and diarrhea since Friday. Patient explains the pain started onFriday after his dialysis treatment. He hasfelt nauseated and epigastric pain that now has turned into diffuse abdominal pain. Describes the pain as a constant throbbing pain denies alleviating factors and admits that food tends to make the pain worse. He admits to vomiting multiple times, denies seeing any blood in his vomit. Patient admits that he is has had frequent diarrhea without noticing blood or dark tarry stools. Patient is a known diabetic, has no history of gallstones or pancreatitis. Patient has past surgical history ofcholeyectomy and hernia repair. Patient has a medical history of arthritis, end-stage renal disease on dialysis, hyperlipidemia, hypertension, type 2 diabetes,. Patient denies headache, congestion, sore  throat, chest pain, shortness of breath, dysuria, leg pain.  Hospital  Course:  See above   Consultations: [facility] Dr. Arta Silence Nephrology     Discharge Exam: Vitals:   09/25/19 1810 09/25/19 2010 09/26/19 0423 09/26/19 0514  BP: 113/76 (!) 135/83 (!) 170/93 (!) 154/76  Pulse: 76 96 84 86  Resp: 17 16 16    Temp: 98.2 F (36.8 C) 99.2 F (37.3 C) 100.1 F (37.8 C)   TempSrc: Oral Oral Oral   SpO2: 99% 100% 100%   Weight: 85.2 kg     Height:        General: A/O x4, No acute respiratory distress Eyes: negative scleral hemorrhage, negative anisocoria, negative icterus ENT: Negative Runny nose, negative gingival bleeding, Neck:  Negative scars, masses, torticollis, lymphadenopathy, JVD Lungs: Clear to auscultation bilaterally without wheezes or crackles Cardiovascular: Regular rate and rhythm without murmur gallop or rub normal S1 and S2  Discharge Instructions   Allergies as of 09/26/2019      Reactions   Cardura [doxazosin Mesylate] Other (See Comments)   Hallucinations   Tape Dermatitis   Paper tape ok to use       Medication List    STOP taking these medications   atenolol 25 MG tablet Commonly known as: TENORMIN   hydrALAZINE 50 MG tablet Commonly known as: APRESOLINE   isosorbide mononitrate 30 MG 24 hr tablet Commonly known as: IMDUR   ticagrelor 90 MG Tabs tablet Commonly known as: BRILINTA     TAKE these medications   acetaminophen 500 MG tablet Commonly known as: TYLENOL Take 1,000 mg by mouth every 6 (six) hours as needed for mild pain or headache.   allopurinol 100 MG tablet Commonly known as: ZYLOPRIM Take 100 mg by mouth daily.   amLODipine 5 MG tablet Commonly known as: NORVASC Take 5 mg by mouth daily.   aspirin 81 MG chewable tablet Chew 1 tablet (81 mg total) by mouth daily.   atorvastatin 40 MG tablet Commonly known as: LIPITOR Take 40 mg by mouth every evening.   calcitRIOL 0.5 MCG capsule Commonly known as: ROCALTROL Take 2 capsules (1 mcg total) by mouth every Monday, Wednesday,  and Friday with hemodialysis. Start taking on: September 27, 2019   calcium acetate 667 MG capsule Commonly known as: PHOSLO Take 3 capsules (2,001 mg total) by mouth 3 (three) times daily with meals.   Darbepoetin Alfa 60 MCG/0.3ML Sosy injection Commonly known as: ARANESP Inject 0.3 mLs (60 mcg total) into the vein every Thursday with hemodialysis. Start taking on: September 28, 2019   ferrous sulfate 325 (65 FE) MG EC tablet Take 325 mg by mouth daily with breakfast.   gabapentin 100 MG capsule Commonly known as: NEURONTIN Take 100 mg by mouth at bedtime.   lidocaine-prilocaine cream Commonly known as: EMLA Apply 1 application topically as needed (for port access).   loperamide 2 MG capsule Commonly known as: IMODIUM Take 1 capsule (2 mg total) by mouth every 6 (six) hours as needed for diarrhea or loose stools.   metoprolol tartrate 25 MG tablet Commonly known as: LOPRESSOR Take 0.5 tablets (12.5 mg total) by mouth 2 (two) times daily.   multivitamin Tabs tablet Take 1 tablet by mouth 2 (two) times daily.   nitroGLYCERIN 0.4 MG SL tablet Commonly known as: NITROSTAT Place 1 tablet (0.4 mg total) under the tongue every 5 (five) minutes as needed for chest pain.   ondansetron 8 MG tablet Commonly known as: ZOFRAN Take 8  mg by mouth 3 (three) times daily.   pantoprazole 40 MG tablet Commonly known as: PROTONIX Take 40 mg by mouth daily.   Sensipar 90 MG tablet Generic drug: cinacalcet Take 90 mg by mouth every Monday, Wednesday, and Friday.   traMADol 50 MG tablet Commonly known as: ULTRAM Take 50 mg by mouth 2 (two) times daily as needed for pain.   Velphoro 500 MG chewable tablet Generic drug: sucroferric oxyhydroxide Chew 500 mg by mouth 3 (three) times daily with meals.            Durable Medical Equipment  (From admission, onward)         Start     Ordered   09/26/19 1013  For home use only DME Walker rolling  Once       Question Answer Comment   Walker: With 5 Inch Wheels   Patient needs a walker to treat with the following condition Weakness      09/26/19 1013         Allergies  Allergen Reactions   Cardura [Doxazosin Mesylate] Other (See Comments)    Hallucinations   Tape Dermatitis    Paper tape ok to use     Follow-up Information    Seward Carol, MD. Go on 10/03/2019.   Specialty: Internal Medicine Why: Your appointment is at 2:30pm, please call w/ any questions or if you need to change appointment time/date.  Contact information: [address] [place] [address] [phone]        [facility], [facility] Follow up.   Specialty: Home Health Services Why: We have requested an aide and RN to visit with you, they will contact you for an intial visit.  Contact information: [place] [address] [phone]                The results of significant diagnostics from this hospitalization (including imaging, microbiology, ancillary and laboratory) are listed below for reference.    Significant Diagnostic Studies: DG Abdomen 1 View  Result Date: 09/20/2019 CLINICAL DATA:  Abdominal pain, nausea, and vomiting for 1 week EXAM: ABDOMEN - 1 VIEW COMPARISON:  06/02/2014 FINDINGS: Brachytherapy seed implants in prostate bed. Scattered atherosclerotic calcifications aorta and iliac arteries as well as renal arteries and splenic artery. Multilevel degenerative disc and facet disease changes of lumbar spine with dextroconvex scoliosis. Nonobstructive bowel gas pattern. No bowel dilatation or bowel wall thickening. Slight gaseous distention of stomach. IMPRESSION: Slight gaseous distention of stomach. Nonobstructive bowel gas pattern. Electronically Signed   By: Lavonia Dana M.D.   On: 09/20/2019 13:12   CT Abdomen Pelvis W Contrast  Result Date: 09/20/2019 CLINICAL DATA:  Diffuse abdominal pain EXAM: CT ABDOMEN AND PELVIS WITH CONTRAST TECHNIQUE: Multidetector CT imaging  of the abdomen and pelvis was performed using the standard protocol following bolus administration of intravenous contrast. CONTRAST:  155mL OMNIPAQUE IOHEXOL 300 MG/ML  SOLN COMPARISON:  12/16/2017 FINDINGS: Lower chest: Mild emphysematous changes as well as bibasilar atelectasis. Hepatobiliary: Gallbladder has been surgically removed. Prominence of the biliary tree is noted. This is within normal limits for the post cholecystectomy state but has increased in the interval from the prior exam. Correlation with lab values is recommended. No hepatic mass is seen. Pancreas: Pancreas is well visualized within normal enhancement pattern. No areas of pancreatic necrosis are noted. Very mild inflammatory changes are noted surrounding the pancreas particularly in the region of the pancreatic head. Spleen: Normal in size  without focal abnormality. Adrenals/Urinary Tract: Adrenal glands are within normal limits. Multiple cysts are noted throughout both kidneys similar to that seen on prior exam. A few tiny nonobstructing renal calculi are noted bilaterally. The ureters are within normal limits. The bladder is decompressed. Bladder wall thickening is noted and stable from the prior study. Stomach/Bowel: Colon shows no obstructive or inflammatory changes. The stomach is fluid filled. There are inflammatory changes surrounding the proximal duodenum as well as what appears to be a small air-fluid collection although this likely represents a duodenal diverticulum posteriorly as this was seen on the prior study. The possibility of focal duodenal diverticulitis deserves consideration as well. The appendix is within normal limits. Vascular/Lymphatic: Aortic atherosclerosis. Mild dilatation of the infrarenal aorta is noted to 3.1 Cm. Changes of chronic dissection are seen and stable. No enlarged abdominal or pelvic lymph nodes. Reproductive: Prostate therapy seeds are noted. Other: No abdominal wall hernia or abnormality. No  abdominopelvic ascites. Musculoskeletal: Degenerative changes of lumbar spine are seen. IMPRESSION: Mild inflammatory changes surrounding the pancreas consistent with early pancreatitis. No pancreatic necrosis is identified at this time. This likely contributes to increased dilatation of the biliary tree from local edema. Inflammatory changes are noted surrounding the second portion of the duodenum with what appears to be an inflamed duodenal diverticulum posteriorly. These changes are likely secondary to the known pancreatitis. Stable aortic aneurysm measuring 3.1 cm. Electronically Signed   By: Inez Catalina M.D.   On: 09/20/2019 15:00    Microbiology: Recent Results (from the past 240 hour(s))  SARS Coronavirus 2 by RT PCR (hospital order, performed in [facility] hospital lab) Nasopharyngeal Nasopharyngeal Swab     Status: None   Collection Time: 09/20/19  2:12 PM   Specimen: Nasopharyngeal Swab  Result Value Ref Range Status   SARS Coronavirus 2 NEGATIVE NEGATIVE Final    Comment: (NOTE) SARS-CoV-2 target nucleic acids are NOT DETECTED.  The SARS-CoV-2 RNA is generally detectable in upper and lower respiratory specimens during the acute phase of infection. The lowest concentration of SARS-CoV-2 viral copies this assay can detect is 250 copies / mL. A negative result does not preclude SARS-CoV-2 infection and should not be used as the sole basis for treatment or other patient management decisions.  A negative result may occur with improper specimen collection / handling, submission of specimen other than nasopharyngeal swab, presence of viral mutation(s) within the areas targeted by this assay, and inadequate number of viral copies (<250 copies / mL). A negative result must be combined with clinical observations, patient history, and epidemiological information.  Fact Sheet for Patients:   StrictlyIdeas.no  Fact Sheet for Healthcare  Providers: BankingDealers.co.za  This test is not yet approved or  cleared by the [place] FDA and has been authorized for detection and/or diagnosis of SARS-CoV-2 by FDA under an Emergency Use Authorization (EUA).  This EUA will remain in effect (meaning this test can be used) for the duration of the COVID-19 declaration under Section 564(b)(1) of the Act, 21 U.S.C. section 360bbb-3(b)(1), unless the authorization is terminated or revoked sooner.  Performed at [facility], [place] [address], [place], [place] [address]      Labs: Basic Metabolic Panel: Recent Labs  Lab 09/21/19 [phone] 09/21/19 0437 09/22/19 0713 09/23/19 0447 09/24/19 0138 09/25/19 0421 09/26/19 0252  NA 138  --  136 137 136 135  --   K 4.6  --  4.3 3.7 3.9 3.9  --   CL 95*  --  98 102 102 99  --   CO2 29  --  27 24 24 24   --   GLUCOSE 150*  --  65* 85 144* 93  --   BUN 48*  --  28* 14 21 31*  --   CREATININE 10.61*  --  6.07* 3.72* 5.01* 6.70*  --   CALCIUM 7.8*   < > 7.9* 8.3* 8.5* 8.7* 8.5*  MG 3.3*   < > 2.7* 2.2 2.2 2.4 2.0  PHOS 6.1*   < > 4.2 2.7 3.3 3.5 2.6   < > = values in this interval not displayed.   Liver Function Tests: Recent Labs  Lab 09/21/19 0437 09/22/19 0713 09/23/19 0447 09/24/19 0138 09/25/19 0421  AST 75* 40 30 30 23   ALT 144* 99* 85* 68* 59*  ALKPHOS 335* 253* 253* 224* 212*  BILITOT 3.3* 0.9 1.0 0.9 0.8  PROT 6.0* 5.4* 5.6* 5.6* 5.8*  ALBUMIN 2.8* 2.5* 2.5* 2.5* 2.7*   Recent Labs  Lab 09/22/19 0713 09/23/19 0447 09/24/19 0138 09/25/19 0421 09/26/19 0252  LIPASE 773* 491* 733* 719* 739*   No results for input(s): AMMONIA in the last 168 hours. CBC: Recent Labs  Lab 09/22/19 0713 09/23/19 0447 09/24/19 0138 09/25/19 0421 09/26/19 0252  WBC 9.3 8.8 8.8 8.8 9.0  NEUTROABS 7.4 6.6 6.4 6.4 6.6  HGB 7.1* 7.8* 7.3* 7.5* 7.5*  HCT 23.5* 24.9* 23.8* 24.8* 24.5*  MCV 87.4 87.1 88.1 86.4 86.3  PLT 263 289 341 350 357   Cardiac  Enzymes: No results for input(s): CKTOTAL, CKMB, CKMBINDEX, TROPONINI in the last 168 hours. BNP: BNP (last 3 results) No results for input(s): BNP in the last 8760 hours.  ProBNP (last 3 results) No results for input(s): PROBNP in the last 8760 hours.  CBG: Recent Labs  Lab 09/25/19 0806 09/25/19 1201 09/25/19 2013 09/26/19 0726 09/26/19 1159  GLUCAP 138* 91 80 96 160*       Signed:  Dia Crawford, MD [facility] [phone] pager

## 2019-09-27 ENCOUNTER — Telehealth: Payer: Self-pay | Admitting: Nephrology

## 2019-09-27 DIAGNOSIS — K859 Acute pancreatitis without necrosis or infection, unspecified: Secondary | ICD-10-CM | POA: Diagnosis not present

## 2019-09-27 DIAGNOSIS — N186 End stage renal disease: Secondary | ICD-10-CM | POA: Diagnosis not present

## 2019-09-27 DIAGNOSIS — R748 Abnormal levels of other serum enzymes: Secondary | ICD-10-CM | POA: Diagnosis not present

## 2019-09-27 DIAGNOSIS — Z950 Presence of cardiac pacemaker: Secondary | ICD-10-CM | POA: Diagnosis not present

## 2019-09-27 DIAGNOSIS — E1122 Type 2 diabetes mellitus with diabetic chronic kidney disease: Secondary | ICD-10-CM | POA: Diagnosis not present

## 2019-09-27 DIAGNOSIS — M109 Gout, unspecified: Secondary | ICD-10-CM | POA: Diagnosis not present

## 2019-09-27 DIAGNOSIS — N2581 Secondary hyperparathyroidism of renal origin: Secondary | ICD-10-CM | POA: Diagnosis not present

## 2019-09-27 DIAGNOSIS — J9601 Acute respiratory failure with hypoxia: Secondary | ICD-10-CM | POA: Diagnosis not present

## 2019-09-27 DIAGNOSIS — Z992 Dependence on renal dialysis: Secondary | ICD-10-CM | POA: Diagnosis not present

## 2019-09-27 NOTE — Telephone Encounter (Signed)
Transition of Care Contact from [place]   Date of Discharge: 09/26/19 Date of Contact: 09/27/19 Method of contact: phone Talked to patient and daughter   Patient contacted to discuss transition of care form recent hospitaliztion. Patient was admitted to [facility] from 7/21 to 7/27 with the discharge diagnosis of acute pancreatitis and acute GIB.    Medication changes were reviewed - d/c atenolol, hydralazine, imdur and brilinta.  Should only be taking Sensipar 30mg  qd.  Not taking calcium acetate, only Velphoro 1 AC TID.    Patient will follow up with is outpatient dialysis center 09/27/19.  Other follow up needs include daughter needs FMLA paper work filled out.  Told her to ask SW at HD center who should fill this out, nephrology vs PCP.   Jen Mow, PA-C [facility] Pager: [phone]

## 2019-09-29 ENCOUNTER — Other Ambulatory Visit: Payer: Self-pay | Admitting: Gastroenterology

## 2019-09-29 DIAGNOSIS — Z992 Dependence on renal dialysis: Secondary | ICD-10-CM | POA: Diagnosis not present

## 2019-09-29 DIAGNOSIS — N2581 Secondary hyperparathyroidism of renal origin: Secondary | ICD-10-CM | POA: Diagnosis not present

## 2019-09-29 DIAGNOSIS — K859 Acute pancreatitis without necrosis or infection, unspecified: Secondary | ICD-10-CM | POA: Diagnosis not present

## 2019-09-29 DIAGNOSIS — N186 End stage renal disease: Secondary | ICD-10-CM | POA: Diagnosis not present

## 2019-09-29 DIAGNOSIS — K922 Gastrointestinal hemorrhage, unspecified: Secondary | ICD-10-CM | POA: Diagnosis not present

## 2019-09-30 DIAGNOSIS — E1129 Type 2 diabetes mellitus with other diabetic kidney complication: Secondary | ICD-10-CM | POA: Diagnosis not present

## 2019-09-30 DIAGNOSIS — N186 End stage renal disease: Secondary | ICD-10-CM | POA: Diagnosis not present

## 2019-09-30 DIAGNOSIS — Z992 Dependence on renal dialysis: Secondary | ICD-10-CM | POA: Diagnosis not present

## 2019-10-02 DIAGNOSIS — Z992 Dependence on renal dialysis: Secondary | ICD-10-CM | POA: Diagnosis not present

## 2019-10-02 DIAGNOSIS — N2581 Secondary hyperparathyroidism of renal origin: Secondary | ICD-10-CM | POA: Diagnosis not present

## 2019-10-02 DIAGNOSIS — N186 End stage renal disease: Secondary | ICD-10-CM | POA: Diagnosis not present

## 2019-10-03 DIAGNOSIS — K922 Gastrointestinal hemorrhage, unspecified: Secondary | ICD-10-CM | POA: Diagnosis not present

## 2019-10-03 DIAGNOSIS — G4733 Obstructive sleep apnea (adult) (pediatric): Secondary | ICD-10-CM | POA: Diagnosis not present

## 2019-10-03 DIAGNOSIS — E1122 Type 2 diabetes mellitus with diabetic chronic kidney disease: Secondary | ICD-10-CM | POA: Diagnosis not present

## 2019-10-03 DIAGNOSIS — R197 Diarrhea, unspecified: Secondary | ICD-10-CM | POA: Diagnosis not present

## 2019-10-03 DIAGNOSIS — N186 End stage renal disease: Secondary | ICD-10-CM | POA: Diagnosis not present

## 2019-10-03 DIAGNOSIS — E1169 Type 2 diabetes mellitus with other specified complication: Secondary | ICD-10-CM | POA: Diagnosis not present

## 2019-10-03 DIAGNOSIS — D631 Anemia in chronic kidney disease: Secondary | ICD-10-CM | POA: Diagnosis not present

## 2019-10-03 DIAGNOSIS — E7800 Pure hypercholesterolemia, unspecified: Secondary | ICD-10-CM | POA: Diagnosis not present

## 2019-10-03 DIAGNOSIS — M1030 Gout due to renal impairment, unspecified site: Secondary | ICD-10-CM | POA: Diagnosis not present

## 2019-10-03 DIAGNOSIS — I10 Essential (primary) hypertension: Secondary | ICD-10-CM | POA: Diagnosis not present

## 2019-10-03 DIAGNOSIS — I442 Atrioventricular block, complete: Secondary | ICD-10-CM | POA: Diagnosis not present

## 2019-10-03 DIAGNOSIS — I350 Nonrheumatic aortic (valve) stenosis: Secondary | ICD-10-CM | POA: Diagnosis not present

## 2019-10-03 DIAGNOSIS — I120 Hypertensive chronic kidney disease with stage 5 chronic kidney disease or end stage renal disease: Secondary | ICD-10-CM | POA: Diagnosis not present

## 2019-10-03 DIAGNOSIS — G8929 Other chronic pain: Secondary | ICD-10-CM | POA: Diagnosis not present

## 2019-10-03 DIAGNOSIS — E1151 Type 2 diabetes mellitus with diabetic peripheral angiopathy without gangrene: Secondary | ICD-10-CM | POA: Diagnosis not present

## 2019-10-03 DIAGNOSIS — I2510 Atherosclerotic heart disease of native coronary artery without angina pectoris: Secondary | ICD-10-CM | POA: Diagnosis not present

## 2019-10-03 DIAGNOSIS — K859 Acute pancreatitis without necrosis or infection, unspecified: Secondary | ICD-10-CM | POA: Diagnosis not present

## 2019-10-04 DIAGNOSIS — N2581 Secondary hyperparathyroidism of renal origin: Secondary | ICD-10-CM | POA: Diagnosis not present

## 2019-10-04 DIAGNOSIS — N186 End stage renal disease: Secondary | ICD-10-CM | POA: Diagnosis not present

## 2019-10-04 DIAGNOSIS — Z992 Dependence on renal dialysis: Secondary | ICD-10-CM | POA: Diagnosis not present

## 2019-10-06 DIAGNOSIS — N2581 Secondary hyperparathyroidism of renal origin: Secondary | ICD-10-CM | POA: Diagnosis not present

## 2019-10-06 DIAGNOSIS — N186 End stage renal disease: Secondary | ICD-10-CM | POA: Diagnosis not present

## 2019-10-06 DIAGNOSIS — Z992 Dependence on renal dialysis: Secondary | ICD-10-CM | POA: Diagnosis not present

## 2019-10-09 DIAGNOSIS — Z992 Dependence on renal dialysis: Secondary | ICD-10-CM | POA: Diagnosis not present

## 2019-10-09 DIAGNOSIS — N186 End stage renal disease: Secondary | ICD-10-CM | POA: Diagnosis not present

## 2019-10-09 DIAGNOSIS — N2581 Secondary hyperparathyroidism of renal origin: Secondary | ICD-10-CM | POA: Diagnosis not present

## 2019-10-13 DIAGNOSIS — N186 End stage renal disease: Secondary | ICD-10-CM | POA: Diagnosis not present

## 2019-10-13 DIAGNOSIS — Z992 Dependence on renal dialysis: Secondary | ICD-10-CM | POA: Diagnosis not present

## 2019-10-13 DIAGNOSIS — N2581 Secondary hyperparathyroidism of renal origin: Secondary | ICD-10-CM | POA: Diagnosis not present

## 2019-10-14 ENCOUNTER — Other Ambulatory Visit (HOSPITAL_COMMUNITY)
Admission: RE | Admit: 2019-10-14 | Discharge: 2019-10-14 | Disposition: A | Discharge status: Home / Self Care | Payer: Medicare HMO | Source: Ambulatory Visit | Attending: Gastroenterology | Admitting: Gastroenterology

## 2019-10-14 DIAGNOSIS — Z01812 Encounter for preprocedural laboratory examination: Secondary | ICD-10-CM | POA: Insufficient documentation

## 2019-10-14 DIAGNOSIS — Z20822 Contact with and (suspected) exposure to covid-19: Secondary | ICD-10-CM | POA: Insufficient documentation

## 2019-10-14 LAB — SARS CORONAVIRUS 2 (TAT 6-24 HRS): SARS Coronavirus 2: NEGATIVE

## 2019-10-16 DIAGNOSIS — Z992 Dependence on renal dialysis: Secondary | ICD-10-CM | POA: Diagnosis not present

## 2019-10-16 DIAGNOSIS — N2581 Secondary hyperparathyroidism of renal origin: Secondary | ICD-10-CM | POA: Diagnosis not present

## 2019-10-16 DIAGNOSIS — N186 End stage renal disease: Secondary | ICD-10-CM | POA: Diagnosis not present

## 2019-10-17 DIAGNOSIS — M1030 Gout due to renal impairment, unspecified site: Secondary | ICD-10-CM | POA: Diagnosis not present

## 2019-10-17 DIAGNOSIS — N186 End stage renal disease: Secondary | ICD-10-CM | POA: Diagnosis not present

## 2019-10-17 DIAGNOSIS — E1151 Type 2 diabetes mellitus with diabetic peripheral angiopathy without gangrene: Secondary | ICD-10-CM | POA: Diagnosis not present

## 2019-10-17 DIAGNOSIS — I120 Hypertensive chronic kidney disease with stage 5 chronic kidney disease or end stage renal disease: Secondary | ICD-10-CM | POA: Diagnosis not present

## 2019-10-17 DIAGNOSIS — R197 Diarrhea, unspecified: Secondary | ICD-10-CM | POA: Diagnosis not present

## 2019-10-17 DIAGNOSIS — E1122 Type 2 diabetes mellitus with diabetic chronic kidney disease: Secondary | ICD-10-CM | POA: Diagnosis not present

## 2019-10-17 DIAGNOSIS — K859 Acute pancreatitis without necrosis or infection, unspecified: Secondary | ICD-10-CM | POA: Diagnosis not present

## 2019-10-17 DIAGNOSIS — D631 Anemia in chronic kidney disease: Secondary | ICD-10-CM | POA: Diagnosis not present

## 2019-10-17 DIAGNOSIS — K922 Gastrointestinal hemorrhage, unspecified: Secondary | ICD-10-CM | POA: Diagnosis not present

## 2019-10-17 NOTE — Progress Notes (Signed)
Attempted pre op call- no answer, generic VM set up.

## 2019-10-18 ENCOUNTER — Ambulatory Visit (HOSPITAL_COMMUNITY): Payer: Medicare HMO | Admitting: Certified Registered Nurse Anesthetist

## 2019-10-18 ENCOUNTER — Other Ambulatory Visit: Payer: Self-pay

## 2019-10-18 ENCOUNTER — Ambulatory Visit (HOSPITAL_COMMUNITY)
Admission: RE | Admit: 2019-10-18 | Discharge: 2019-10-18 | Disposition: A | Discharge status: Home / Self Care | Payer: Medicare HMO | Attending: Gastroenterology | Admitting: Gastroenterology

## 2019-10-18 ENCOUNTER — Encounter (HOSPITAL_COMMUNITY)
Admission: RE | Disposition: A | Discharge status: Home / Self Care | Payer: Self-pay | Source: Home / Self Care | Attending: Gastroenterology

## 2019-10-18 DIAGNOSIS — E1122 Type 2 diabetes mellitus with diabetic chronic kidney disease: Secondary | ICD-10-CM | POA: Diagnosis not present

## 2019-10-18 DIAGNOSIS — E669 Obesity, unspecified: Secondary | ICD-10-CM | POA: Insufficient documentation

## 2019-10-18 DIAGNOSIS — I080 Rheumatic disorders of both mitral and aortic valves: Secondary | ICD-10-CM | POA: Insufficient documentation

## 2019-10-18 DIAGNOSIS — I252 Old myocardial infarction: Secondary | ICD-10-CM | POA: Diagnosis not present

## 2019-10-18 DIAGNOSIS — Z9049 Acquired absence of other specified parts of digestive tract: Secondary | ICD-10-CM | POA: Insufficient documentation

## 2019-10-18 DIAGNOSIS — I120 Hypertensive chronic kidney disease with stage 5 chronic kidney disease or end stage renal disease: Secondary | ICD-10-CM | POA: Diagnosis not present

## 2019-10-18 DIAGNOSIS — Z8673 Personal history of transient ischemic attack (TIA), and cerebral infarction without residual deficits: Secondary | ICD-10-CM | POA: Insufficient documentation

## 2019-10-18 DIAGNOSIS — Z8546 Personal history of malignant neoplasm of prostate: Secondary | ICD-10-CM | POA: Insufficient documentation

## 2019-10-18 DIAGNOSIS — H5440 Blindness, one eye, unspecified eye: Secondary | ICD-10-CM | POA: Insufficient documentation

## 2019-10-18 DIAGNOSIS — Z992 Dependence on renal dialysis: Secondary | ICD-10-CM | POA: Diagnosis not present

## 2019-10-18 DIAGNOSIS — I739 Peripheral vascular disease, unspecified: Secondary | ICD-10-CM | POA: Insufficient documentation

## 2019-10-18 DIAGNOSIS — Z955 Presence of coronary angioplasty implant and graft: Secondary | ICD-10-CM | POA: Insufficient documentation

## 2019-10-18 DIAGNOSIS — Z6831 Body mass index (BMI) 31.0-31.9, adult: Secondary | ICD-10-CM | POA: Diagnosis not present

## 2019-10-18 DIAGNOSIS — K859 Acute pancreatitis without necrosis or infection, unspecified: Secondary | ICD-10-CM | POA: Insufficient documentation

## 2019-10-18 DIAGNOSIS — E785 Hyperlipidemia, unspecified: Secondary | ICD-10-CM | POA: Insufficient documentation

## 2019-10-18 DIAGNOSIS — I708 Atherosclerosis of other arteries: Secondary | ICD-10-CM | POA: Insufficient documentation

## 2019-10-18 DIAGNOSIS — N186 End stage renal disease: Secondary | ICD-10-CM | POA: Insufficient documentation

## 2019-10-18 DIAGNOSIS — Z79899 Other long term (current) drug therapy: Secondary | ICD-10-CM | POA: Diagnosis not present

## 2019-10-18 DIAGNOSIS — M109 Gout, unspecified: Secondary | ICD-10-CM | POA: Insufficient documentation

## 2019-10-18 DIAGNOSIS — Z950 Presence of cardiac pacemaker: Secondary | ICD-10-CM | POA: Insufficient documentation

## 2019-10-18 DIAGNOSIS — K551 Chronic vascular disorders of intestine: Secondary | ICD-10-CM | POA: Diagnosis not present

## 2019-10-18 DIAGNOSIS — Z7982 Long term (current) use of aspirin: Secondary | ICD-10-CM | POA: Diagnosis not present

## 2019-10-18 DIAGNOSIS — R748 Abnormal levels of other serum enzymes: Secondary | ICD-10-CM | POA: Diagnosis not present

## 2019-10-18 DIAGNOSIS — M1990 Unspecified osteoarthritis, unspecified site: Secondary | ICD-10-CM | POA: Diagnosis not present

## 2019-10-18 HISTORY — PX: EUS: SHX5427

## 2019-10-18 HISTORY — PX: ESOPHAGOGASTRODUODENOSCOPY (EGD) WITH PROPOFOL: SHX5813

## 2019-10-18 LAB — POCT I-STAT EG7
Acid-Base Excess: 9 mmol/L — ABNORMAL HIGH (ref 0.0–2.0)
Bicarbonate: 33.5 mmol/L — ABNORMAL HIGH (ref 20.0–28.0)
Calcium, Ion: 1.17 mmol/L (ref 1.15–1.40)
HCT: 28 % — ABNORMAL LOW (ref 39.0–52.0)
Hemoglobin: 9.5 g/dL — ABNORMAL LOW (ref 13.0–17.0)
O2 Saturation: 58 %
Potassium: 3.6 mmol/L (ref 3.5–5.1)
Sodium: 138 mmol/L (ref 135–145)
TCO2: 35 mmol/L — ABNORMAL HIGH (ref 22–32)
pCO2, Ven: 45.1 mmHg (ref 44.0–60.0)
pH, Ven: 7.479 — ABNORMAL HIGH (ref 7.250–7.430)
pO2, Ven: 28 mmHg — CL (ref 32.0–45.0)

## 2019-10-18 LAB — GLUCOSE, CAPILLARY: Glucose-Capillary: 80 mg/dL (ref 70–99)

## 2019-10-18 SURGERY — ESOPHAGOGASTRODUODENOSCOPY (EGD) WITH PROPOFOL
Anesthesia: Monitor Anesthesia Care

## 2019-10-18 MED ORDER — PROPOFOL 500 MG/50ML IV EMUL
INTRAVENOUS | Status: AC
  Filled 2019-10-18: qty 50

## 2019-10-18 MED ORDER — INDOMETHACIN 50 MG RE SUPP
RECTAL | Status: AC
  Filled 2019-10-18: qty 2

## 2019-10-18 MED ORDER — GLUCAGON HCL RDNA (DIAGNOSTIC) 1 MG IJ SOLR
INTRAMUSCULAR | Status: AC
  Filled 2019-10-18: qty 1

## 2019-10-18 MED ORDER — PROPOFOL 10 MG/ML IV BOLUS
INTRAVENOUS | Status: DC | PRN
  Administered 2019-10-18: 30 mg via INTRAVENOUS

## 2019-10-18 MED ORDER — CIPROFLOXACIN IN D5W 400 MG/200ML IV SOLN
INTRAVENOUS | Status: AC
  Filled 2019-10-18: qty 200

## 2019-10-18 MED ORDER — SODIUM CHLORIDE 0.9 % IV SOLN
INTRAVENOUS | Status: DC
  Administered 2019-10-18: 500 mL via INTRAVENOUS

## 2019-10-18 MED ORDER — PROPOFOL 500 MG/50ML IV EMUL
INTRAVENOUS | Status: DC | PRN
  Administered 2019-10-18: 100 ug/kg/min via INTRAVENOUS

## 2019-10-18 SURGICAL SUPPLY — 14 items

## 2019-10-18 NOTE — Transfer of Care (Signed)
Immediate Anesthesia Transfer of Care Note  Patient: Sean Best  Procedure(s) Performed: ESOPHAGOGASTRODUODENOSCOPY (EGD) WITH PROPOFOL (N/A ) FULL UPPER ENDOSCOPIC ULTRASOUND (EUS) RADIAL (N/A )  Patient Location: Endoscopy Unit  Anesthesia Type:MAC  Level of Consciousness: drowsy  Airway & Oxygen Therapy: Patient Spontanous Breathing and Patient connected to face mask  Post-op Assessment: Report given to RN and Post -op Vital signs reviewed and stable  Post vital signs: Reviewed and stable  Last Vitals:  Vitals Value Taken Time  BP    Temp    Pulse 68 10/18/19 1018  Resp 25 10/18/19 1018  SpO2 100 % 10/18/19 1018  Vitals shown include unvalidated device data.  Last Pain:  Vitals:   10/18/19 0839  TempSrc: Oral         Complications: No complications documented.

## 2019-10-18 NOTE — H&P (Signed)
Patient interval history reviewed.  Patient examined again.  There has been no change from documented H/P scanned into chart from our office except as documented below.  Assessment:  1.  History gallstone pancreatitis, clinically resolved. 2.  Elevated LFTs, downtrending. 3.  Prior cholecystectomy.  Plan:  1.  EUS to see if choledocholithiasis is seen, with ERCP to follow (if needed). 2.  Risks (bleeding, infection, bowel perforation that could require surgery, sedation-related changes in cardiopulmonary systems), benefits (identification and possible treatment of source of symptoms, exclusion of certain causes of symptoms), and alternatives (watchful waiting, radiographic imaging studies, empiric medical treatment) of upper endoscopy with ultrasound and possible fine needle aspiration (EUS +/- FNA) were explained to patient/family in detail and patient wishes to proceed. 3.  Risks (up to and including bleeding, infection, perforation, pancreatitis that can be complicated by infected necrosis and death), benefits (removal of stones, alleviating blockage, decreasing risk of cholangitis or choledocholithiasis-related pancreatitis), and alternatives (watchful waiting, percutaneous transhepatic cholangiography) of ERCP were explained to patient/family in detail and patient elects to proceed.

## 2019-10-18 NOTE — Discharge Instructions (Signed)
Endoscopy °Care After °Please read the instructions outlined below and refer to this sheet in the next few weeks. These discharge instructions provide you with general information on caring for yourself after you leave the hospital. Your doctor may also give you specific instructions. While your treatment has been planned according to the most current medical practices available, unavoidable complications occasionally occur. If you have any problems or questions after discharge, please call Dr. Tameka Hoiland ([facility]) at [phone]. ° °HOME CARE INSTRUCTIONS °Activity °· You may resume your regular activity but move at a slower pace for the next 24 hours.  °· Take frequent rest periods for the next 24 hours.  °· Walking will help expel (get rid of) the air and reduce the bloated feeling in your abdomen.  °· No driving for 24 hours (because of the anesthesia (medicine) used during the test).  °· You may shower.  °· Do not sign any important legal documents or operate any machinery for 24 hours (because of the anesthesia used during the test).  °Nutrition °· Drink plenty of fluids.  °· You may resume your normal diet.  °· Begin with a light meal and progress to your normal diet.  °· Avoid alcoholic beverages for 24 hours or as instructed by your caregiver.  °Medications °You may resume your normal medications unless your caregiver tells you otherwise. °What you can expect today °· You may experience abdominal discomfort such as a feeling of fullness or "gas" pains.  °· You may experience a sore throat for 2 to 3 days. This is normal. Gargling with salt water may help this.  °·  °SEEK IMMEDIATE MEDICAL CARE IF: °· You have excessive nausea (feeling sick to your stomach) and/or vomiting.  °· You have severe abdominal pain and distention (swelling).  °· You have trouble swallowing.  °· You have a temperature over 100° F (37.8° C).  °· You have rectal bleeding or vomiting of blood.  °Document Released:  10/01/2003 Document Revised: 10/29/2010 Document Reviewed: 04/13/2007 °[employer]® Patient Information ©2012 [employer]. °

## 2019-10-18 NOTE — Op Note (Signed)
[facility] Patient Name: Sean Best Procedure Date: 10/18/2019 MRN: [id number] Attending MD: Arta Silence , MD Date of Birth: Aug 30, 1933 CSN: [id number] Age: 86 Admit Type: Outpatient Procedure:                Upper EUS Indications:              Abnormal abdominal/pelvic CT scan, Acute                            pancreatitis Providers:                Arta Silence, MD, Benetta Spar RN, RN, Tyna Jaksch Technician Referring MD:              Medicines:                Monitored Anesthesia Care Complications:            No immediate complications. Estimated Blood Loss:     Estimated blood loss: none. Procedure:                Pre-Anesthesia Assessment:                           - Prior to the procedure, a History and Physical                            was performed, and patient medications and                            allergies were reviewed. The patient's tolerance of                            previous anesthesia was also reviewed. The risks                            and benefits of the procedure and the sedation                            options and risks were discussed with the patient.                            All questions were answered, and informed consent                            was obtained. Prior Anticoagulants: The patient has                            taken no previous anticoagulant or antiplatelet                            agents. ASA Grade Assessment: IV - A patient with                            severe systemic disease that  is a constant threat                            to life. After reviewing the risks and benefits,                            the patient was deemed in satisfactory condition to                            undergo the procedure.                           After obtaining informed consent, the endoscope was                            passed under direct vision. Throughout the                             procedure, the patient's blood pressure, pulse, and                            oxygen saturations were monitored continuously. The                            [id number] ([id number]) Olympus was introduced through                            the mouth, and advanced to the second part of                            duodenum. The upper EUS was accomplished without                            difficulty. The patient tolerated the procedure                            well. Scope In: Scope Out: Findings:      ENDOSONOGRAPHIC FINDING: :      Endosonographic images of the stomach were unremarkable.      There was no sign of significant endosonographic abnormality in the left       lobe of the liver.      Endosonographic examination of the celiac trunk, common hepatic artery       and splenic artery suggested the presence of moderate atherosclerotic       changes, which were patchy in extent. These atherosclerotic changes were       not occlusive. These atherosclerotic changes almost looked like       choledocholithiasis, however the tubular structures in which they       resided (likely hepatic artery) were avidly doppler flow positive. These       shadows created lots of artifact in the pancreatic region, thus subtle       lesion in head/uncinate pancreas could be missed.      Evidence of a previous cholecystectomy was identified       endosonographically.      With time, a doppler-negative  tubular structure was identified flowing       through the pancreas in the expected bile duct region, and with time       highly consistent with common hepatic and common bile duct. There was no       sign of significant endosonographic abnormality in the common bile duct       and in the common hepatic duct. The maximum diameter of the ducts were 6       mm. No stones were identified.      A few hyperechoic foci measuring up to three mm in diameter suggestive       of stones were found in the pancreatic  head and body of the pancreas.      Endosonographic imaging of the pancreas showed sonographic changes       indicative of moderate chronic pancreatitis in the entire pancreas. The       parenchyma had calcifications, hyperechoic strands, hyperechoic foci,       hypoechoic foci, lobularity and shadowing foci. No obvious mass in       pancreas (with some limitations as above).      No lymphadenopathy seen. Impression:               - Endosonographic images of the stomach were                            unremarkable.                           - There was no evidence of significant pathology in                            the left lobe of the liver.                           - Atherosclerosis of the celiac trunk, common                            hepatic artery and splenic artery was seen                            endosonographically. Shadowing artifact caused some                            obscuring of views of the head/uncinate pancreas,                            and subtle lesions in this area could be easily                            missed.                           - Evidence of a cholecystectomy.                           - There was no sign of significant pathology in the  common bile duct and in the common hepatic duct.                           - Findings suggestive of pancreatic stones were                            identified in the pancreatic head and body of the                            pancreas.                           - Endosonographic imaging of the pancreas showed                            sonographic changes consistent with moderate                            chronic pancreatitis. Moderate Sedation:      None Recommendation:           - Discharge patient to home (via wheelchair).                           - Resume previous diet today.                           - Continue present medications.                           - Return to GI  clinic in 3 months. Procedure Code(s):        --- Professional ---                           [id number], Esophagogastroduodenoscopy, flexible,                            transoral; with endoscopic ultrasound examination,                            including the esophagus, stomach, and either the                            duodenum or a surgically altered stomach where the                            jejunum is examined distal to the anastomosis Diagnosis Code(s):        --- Professional ---                           R93.3, Abnormal findings on diagnostic imaging of                            other parts of digestive tract  K55.1, Chronic vascular disorders of intestine                           I70.8, Atherosclerosis of other arteries                           Z90.49, Acquired absence of other specified parts                            of digestive tract                           K85.90, Acute pancreatitis without necrosis or                            infection, unspecified                           R93.5, Abnormal findings on diagnostic imaging of                            other abdominal regions, including retroperitoneum CPT copyright 2019 [employer]. All rights reserved. The codes documented in this report are preliminary and upon coder review may  be revised to meet current compliance requirements. Arta Silence, MD 10/18/2019 10:24:39 AM This report has been signed electronically. Number of Addenda: 0

## 2019-10-18 NOTE — Anesthesia Preprocedure Evaluation (Addendum)
Anesthesia Evaluation  Patient identified by MRN, date of birth, ID band Patient awake    Reviewed: Allergy & Precautions, NPO status , Patient's Chart, lab work & pertinent test results, reviewed documented beta blocker date and time   Airway Mallampati: II  TM Distance: >3 FB Neck ROM: Full    Dental no notable dental hx. (+) Edentulous Upper,    Pulmonary neg pulmonary ROS,    Pulmonary exam normal breath sounds clear to auscultation       Cardiovascular hypertension, Pt. on home beta blockers and Pt. on medications + Past MI, + Cardiac Stents (2019) and + Peripheral Vascular Disease  Normal cardiovascular exam+ pacemaker (for CHB/symptomatic bradycardia) + Valvular Problems/Murmurs AS  Rhythm:Regular Rate:Normal  HLD  TTE 2020 LVEF 60-65%, severe LVH, normal wall motion, severe LAE, mild RAE, calcified mitral valve with mild regurgitation, mild to moderate aortic stenosis (mean gradient 12 mmHg), no significant TR, IVC suggests RA pressure of 8 mmHg   Stress Test 2020 The left ventricular ejection fraction is mildly decreased (45-54%). Nuclear stress EF: 46%. There was no ST segment deviation noted during stress. The study is normal. This is a low risk study  LHC 2019 Ost RCA to Prox RCA lesion is 30% stenosed. Dist RCA lesion is 30% stenosed. Post Atrio lesion is 80% stenosed. Ost 1st Mrg lesion is 70% stenosed. 1st Mrg lesion is 95% stenosed. Prox Cx lesion is 50% stenosed. Mid Cx to Dist Cx lesion is 75% stenosed. A drug-eluting stent was successfully placed using a STENT ORSIRO 3.0X13. Post intervention, there is a 0% residual stenosis. A drug-eluting stent was successfully placed using a STENT ORSIRO 3.5X13. Post intervention, there is a 0% residual stenosis. 1.  Severe single-vessel coronary artery disease involving the left circumflex/obtuse marginal branch, treated successfully with 2  drug-eluting stents 2.  Mild nonobstructive stenosis of the RCA and LAD 3.  Normal LV systolic function and mild to moderate aortic stenosis by noninvasive assessment    Neuro/Psych CVA, No Residual Symptoms negative psych ROS   GI/Hepatic Neg liver ROS, GERD  Medicated,  Endo/Other  negative endocrine ROSdiabetes  Renal/GU Dialysis and ESRFRenal disease (diaylsis MWF)  negative genitourinary   Musculoskeletal  (+) Arthritis ,   Abdominal   Peds  Hematology negative hematology ROS (+)   Anesthesia Other Findings   Reproductive/Obstetrics                           Anesthesia Physical Anesthesia Plan  ASA: IV  Anesthesia Plan: MAC   Post-op Pain Management:    Induction: Intravenous  PONV Risk Score and Plan: 1 and Treatment may vary due to age or medical condition and Propofol infusion  Airway Management Planned: Natural Airway  Additional Equipment:   Intra-op Plan:   Post-operative Plan:   Informed Consent: I have reviewed the patients History and Physical, chart, labs and discussed the procedure including the risks, benefits and alternatives for the proposed anesthesia with the patient or authorized representative who has indicated his/her understanding and acceptance.     Dental advisory given  Plan Discussed with: CRNA  Anesthesia Plan Comments: (Plan to start with MAC to evaluate for presence of stone. Will convert to GA if needed. )       Anesthesia Quick Evaluation

## 2019-10-18 NOTE — Anesthesia Postprocedure Evaluation (Signed)
Anesthesia Post Note  Patient: Kristy Catoe  Procedure(s) Performed: ESOPHAGOGASTRODUODENOSCOPY (EGD) WITH PROPOFOL (N/A ) FULL UPPER ENDOSCOPIC ULTRASOUND (EUS) RADIAL (N/A )     Patient location during evaluation: Endoscopy Anesthesia Type: MAC Level of consciousness: awake and alert Pain management: pain level controlled Vital Signs Assessment: post-procedure vital signs reviewed and stable Respiratory status: spontaneous breathing, nonlabored ventilation, respiratory function stable and patient connected to nasal cannula oxygen Cardiovascular status: blood pressure returned to baseline and stable Postop Assessment: no apparent nausea or vomiting Anesthetic complications: no   No complications documented.  Last Vitals:  Vitals:   10/18/19 1040 10/18/19 1050  BP: (!) 181/77 (!) 172/72  Pulse: 67 71  Resp: 20 19  Temp:    SpO2: 100% 99%    Last Pain:  Vitals:   10/18/19 1050  TempSrc:   PainSc: 0-No pain                 Mazell Aylesworth L Maxamillian Tienda

## 2019-10-20 ENCOUNTER — Encounter (HOSPITAL_COMMUNITY): Payer: Self-pay | Admitting: Gastroenterology

## 2019-10-20 DIAGNOSIS — Z992 Dependence on renal dialysis: Secondary | ICD-10-CM | POA: Diagnosis not present

## 2019-10-20 DIAGNOSIS — N2581 Secondary hyperparathyroidism of renal origin: Secondary | ICD-10-CM | POA: Diagnosis not present

## 2019-10-20 DIAGNOSIS — N186 End stage renal disease: Secondary | ICD-10-CM | POA: Diagnosis not present

## 2019-10-23 DIAGNOSIS — Z992 Dependence on renal dialysis: Secondary | ICD-10-CM | POA: Diagnosis not present

## 2019-10-23 DIAGNOSIS — N2581 Secondary hyperparathyroidism of renal origin: Secondary | ICD-10-CM | POA: Diagnosis not present

## 2019-10-23 DIAGNOSIS — N186 End stage renal disease: Secondary | ICD-10-CM | POA: Diagnosis not present

## 2019-10-24 DIAGNOSIS — E1151 Type 2 diabetes mellitus with diabetic peripheral angiopathy without gangrene: Secondary | ICD-10-CM | POA: Diagnosis not present

## 2019-10-24 DIAGNOSIS — D631 Anemia in chronic kidney disease: Secondary | ICD-10-CM | POA: Diagnosis not present

## 2019-10-24 DIAGNOSIS — K859 Acute pancreatitis without necrosis or infection, unspecified: Secondary | ICD-10-CM | POA: Diagnosis not present

## 2019-10-24 DIAGNOSIS — R197 Diarrhea, unspecified: Secondary | ICD-10-CM | POA: Diagnosis not present

## 2019-10-24 DIAGNOSIS — I120 Hypertensive chronic kidney disease with stage 5 chronic kidney disease or end stage renal disease: Secondary | ICD-10-CM | POA: Diagnosis not present

## 2019-10-24 DIAGNOSIS — M1030 Gout due to renal impairment, unspecified site: Secondary | ICD-10-CM | POA: Diagnosis not present

## 2019-10-24 DIAGNOSIS — E1122 Type 2 diabetes mellitus with diabetic chronic kidney disease: Secondary | ICD-10-CM | POA: Diagnosis not present

## 2019-10-24 DIAGNOSIS — N186 End stage renal disease: Secondary | ICD-10-CM | POA: Diagnosis not present

## 2019-10-24 DIAGNOSIS — K922 Gastrointestinal hemorrhage, unspecified: Secondary | ICD-10-CM | POA: Diagnosis not present

## 2019-10-25 DIAGNOSIS — N186 End stage renal disease: Secondary | ICD-10-CM | POA: Diagnosis not present

## 2019-10-25 DIAGNOSIS — Z992 Dependence on renal dialysis: Secondary | ICD-10-CM | POA: Diagnosis not present

## 2019-10-25 DIAGNOSIS — N2581 Secondary hyperparathyroidism of renal origin: Secondary | ICD-10-CM | POA: Diagnosis not present

## 2019-10-26 DIAGNOSIS — I120 Hypertensive chronic kidney disease with stage 5 chronic kidney disease or end stage renal disease: Secondary | ICD-10-CM | POA: Diagnosis not present

## 2019-10-26 DIAGNOSIS — E1151 Type 2 diabetes mellitus with diabetic peripheral angiopathy without gangrene: Secondary | ICD-10-CM | POA: Diagnosis not present

## 2019-10-26 DIAGNOSIS — E1122 Type 2 diabetes mellitus with diabetic chronic kidney disease: Secondary | ICD-10-CM | POA: Diagnosis not present

## 2019-10-26 DIAGNOSIS — K859 Acute pancreatitis without necrosis or infection, unspecified: Secondary | ICD-10-CM | POA: Diagnosis not present

## 2019-10-26 DIAGNOSIS — M1030 Gout due to renal impairment, unspecified site: Secondary | ICD-10-CM | POA: Diagnosis not present

## 2019-10-26 DIAGNOSIS — K922 Gastrointestinal hemorrhage, unspecified: Secondary | ICD-10-CM | POA: Diagnosis not present

## 2019-10-26 DIAGNOSIS — D631 Anemia in chronic kidney disease: Secondary | ICD-10-CM | POA: Diagnosis not present

## 2019-10-26 DIAGNOSIS — R197 Diarrhea, unspecified: Secondary | ICD-10-CM | POA: Diagnosis not present

## 2019-10-26 DIAGNOSIS — N186 End stage renal disease: Secondary | ICD-10-CM | POA: Diagnosis not present

## 2019-10-27 DIAGNOSIS — N2581 Secondary hyperparathyroidism of renal origin: Secondary | ICD-10-CM | POA: Diagnosis not present

## 2019-10-27 DIAGNOSIS — Z992 Dependence on renal dialysis: Secondary | ICD-10-CM | POA: Diagnosis not present

## 2019-10-27 DIAGNOSIS — N186 End stage renal disease: Secondary | ICD-10-CM | POA: Diagnosis not present

## 2019-10-28 DIAGNOSIS — E1122 Type 2 diabetes mellitus with diabetic chronic kidney disease: Secondary | ICD-10-CM | POA: Diagnosis not present

## 2019-10-28 DIAGNOSIS — J9601 Acute respiratory failure with hypoxia: Secondary | ICD-10-CM | POA: Diagnosis not present

## 2019-10-28 DIAGNOSIS — M109 Gout, unspecified: Secondary | ICD-10-CM | POA: Diagnosis not present

## 2019-10-28 DIAGNOSIS — Z950 Presence of cardiac pacemaker: Secondary | ICD-10-CM | POA: Diagnosis not present

## 2019-10-31 DIAGNOSIS — E1151 Type 2 diabetes mellitus with diabetic peripheral angiopathy without gangrene: Secondary | ICD-10-CM | POA: Diagnosis not present

## 2019-10-31 DIAGNOSIS — Z992 Dependence on renal dialysis: Secondary | ICD-10-CM | POA: Diagnosis not present

## 2019-10-31 DIAGNOSIS — M1030 Gout due to renal impairment, unspecified site: Secondary | ICD-10-CM | POA: Diagnosis not present

## 2019-10-31 DIAGNOSIS — K922 Gastrointestinal hemorrhage, unspecified: Secondary | ICD-10-CM | POA: Diagnosis not present

## 2019-10-31 DIAGNOSIS — E1129 Type 2 diabetes mellitus with other diabetic kidney complication: Secondary | ICD-10-CM | POA: Diagnosis not present

## 2019-10-31 DIAGNOSIS — R197 Diarrhea, unspecified: Secondary | ICD-10-CM | POA: Diagnosis not present

## 2019-10-31 DIAGNOSIS — E1122 Type 2 diabetes mellitus with diabetic chronic kidney disease: Secondary | ICD-10-CM | POA: Diagnosis not present

## 2019-10-31 DIAGNOSIS — N186 End stage renal disease: Secondary | ICD-10-CM | POA: Diagnosis not present

## 2019-10-31 DIAGNOSIS — K859 Acute pancreatitis without necrosis or infection, unspecified: Secondary | ICD-10-CM | POA: Diagnosis not present

## 2019-10-31 DIAGNOSIS — I120 Hypertensive chronic kidney disease with stage 5 chronic kidney disease or end stage renal disease: Secondary | ICD-10-CM | POA: Diagnosis not present

## 2019-10-31 DIAGNOSIS — D631 Anemia in chronic kidney disease: Secondary | ICD-10-CM | POA: Diagnosis not present

## 2019-11-02 DIAGNOSIS — D631 Anemia in chronic kidney disease: Secondary | ICD-10-CM | POA: Diagnosis not present

## 2019-11-02 DIAGNOSIS — K922 Gastrointestinal hemorrhage, unspecified: Secondary | ICD-10-CM | POA: Diagnosis not present

## 2019-11-02 DIAGNOSIS — R310 Gross hematuria: Secondary | ICD-10-CM | POA: Diagnosis not present

## 2019-11-02 DIAGNOSIS — R197 Diarrhea, unspecified: Secondary | ICD-10-CM | POA: Diagnosis not present

## 2019-11-02 DIAGNOSIS — M1030 Gout due to renal impairment, unspecified site: Secondary | ICD-10-CM | POA: Diagnosis not present

## 2019-11-02 DIAGNOSIS — Z8546 Personal history of malignant neoplasm of prostate: Secondary | ICD-10-CM | POA: Diagnosis not present

## 2019-11-02 DIAGNOSIS — N186 End stage renal disease: Secondary | ICD-10-CM | POA: Diagnosis not present

## 2019-11-02 DIAGNOSIS — E1122 Type 2 diabetes mellitus with diabetic chronic kidney disease: Secondary | ICD-10-CM | POA: Diagnosis not present

## 2019-11-02 DIAGNOSIS — K859 Acute pancreatitis without necrosis or infection, unspecified: Secondary | ICD-10-CM | POA: Diagnosis not present

## 2019-11-02 DIAGNOSIS — E1151 Type 2 diabetes mellitus with diabetic peripheral angiopathy without gangrene: Secondary | ICD-10-CM | POA: Diagnosis not present

## 2019-11-02 DIAGNOSIS — I120 Hypertensive chronic kidney disease with stage 5 chronic kidney disease or end stage renal disease: Secondary | ICD-10-CM | POA: Diagnosis not present

## 2019-11-03 DIAGNOSIS — N186 End stage renal disease: Secondary | ICD-10-CM | POA: Diagnosis not present

## 2019-11-03 DIAGNOSIS — N2581 Secondary hyperparathyroidism of renal origin: Secondary | ICD-10-CM | POA: Diagnosis not present

## 2019-11-03 DIAGNOSIS — Z992 Dependence on renal dialysis: Secondary | ICD-10-CM | POA: Diagnosis not present

## 2019-11-06 DIAGNOSIS — Z992 Dependence on renal dialysis: Secondary | ICD-10-CM | POA: Diagnosis not present

## 2019-11-06 DIAGNOSIS — N186 End stage renal disease: Secondary | ICD-10-CM | POA: Diagnosis not present

## 2019-11-06 DIAGNOSIS — N2581 Secondary hyperparathyroidism of renal origin: Secondary | ICD-10-CM | POA: Diagnosis not present

## 2019-11-08 ENCOUNTER — Ambulatory Visit (INDEPENDENT_AMBULATORY_CARE_PROVIDER_SITE_OTHER): Payer: Medicare HMO | Admitting: *Deleted

## 2019-11-08 DIAGNOSIS — Z992 Dependence on renal dialysis: Secondary | ICD-10-CM | POA: Diagnosis not present

## 2019-11-08 DIAGNOSIS — N2581 Secondary hyperparathyroidism of renal origin: Secondary | ICD-10-CM | POA: Diagnosis not present

## 2019-11-08 DIAGNOSIS — I459 Conduction disorder, unspecified: Secondary | ICD-10-CM | POA: Diagnosis not present

## 2019-11-08 DIAGNOSIS — N186 End stage renal disease: Secondary | ICD-10-CM | POA: Diagnosis not present

## 2019-11-08 LAB — CUP PACEART REMOTE DEVICE CHECK
Battery Remaining Longevity: 105 mo
Battery Voltage: 2.9 V
Brady Statistic AP VP Percent: 6.46 %
Brady Statistic AP VS Percent: 0.04 %
Brady Statistic AS VP Percent: 87.27 %
Brady Statistic AS VS Percent: 6.22 %
Brady Statistic RA Percent Paced: 6.55 %
Brady Statistic RV Percent Paced: 93.73 %
Date Time Interrogation Session: 20210908013826
Implantable Lead Implant Date: 20190219
Implantable Lead Implant Date: 20190219
Implantable Lead Location: 753859
Implantable Lead Location: 753860
Implantable Lead Model: 3830
Implantable Lead Model: 5076
Implantable Pulse Generator Implant Date: 20190219
Lead Channel Impedance Value: 266 Ohm
Lead Channel Impedance Value: 285 Ohm
Lead Channel Impedance Value: 361 Ohm
Lead Channel Impedance Value: 399 Ohm
Lead Channel Pacing Threshold Amplitude: 0.5 V
Lead Channel Pacing Threshold Amplitude: 1.75 V
Lead Channel Pacing Threshold Pulse Width: 0.4 ms
Lead Channel Pacing Threshold Pulse Width: 0.4 ms
Lead Channel Sensing Intrinsic Amplitude: 3.5 mV
Lead Channel Sensing Intrinsic Amplitude: 3.5 mV
Lead Channel Sensing Intrinsic Amplitude: 4.875 mV
Lead Channel Sensing Intrinsic Amplitude: 4.875 mV
Lead Channel Setting Pacing Amplitude: 2 V
Lead Channel Setting Pacing Amplitude: 2.5 V
Lead Channel Setting Pacing Pulse Width: 1 ms
Lead Channel Setting Sensing Sensitivity: 1.2 mV

## 2019-11-09 ENCOUNTER — Other Ambulatory Visit: Payer: Self-pay

## 2019-11-09 DIAGNOSIS — D631 Anemia in chronic kidney disease: Secondary | ICD-10-CM | POA: Diagnosis not present

## 2019-11-09 DIAGNOSIS — E1151 Type 2 diabetes mellitus with diabetic peripheral angiopathy without gangrene: Secondary | ICD-10-CM | POA: Diagnosis not present

## 2019-11-09 DIAGNOSIS — K859 Acute pancreatitis without necrosis or infection, unspecified: Secondary | ICD-10-CM | POA: Diagnosis not present

## 2019-11-09 DIAGNOSIS — N186 End stage renal disease: Secondary | ICD-10-CM | POA: Diagnosis not present

## 2019-11-09 DIAGNOSIS — M1030 Gout due to renal impairment, unspecified site: Secondary | ICD-10-CM | POA: Diagnosis not present

## 2019-11-09 DIAGNOSIS — K922 Gastrointestinal hemorrhage, unspecified: Secondary | ICD-10-CM | POA: Diagnosis not present

## 2019-11-09 DIAGNOSIS — R197 Diarrhea, unspecified: Secondary | ICD-10-CM | POA: Diagnosis not present

## 2019-11-09 DIAGNOSIS — E1122 Type 2 diabetes mellitus with diabetic chronic kidney disease: Secondary | ICD-10-CM | POA: Diagnosis not present

## 2019-11-09 DIAGNOSIS — I120 Hypertensive chronic kidney disease with stage 5 chronic kidney disease or end stage renal disease: Secondary | ICD-10-CM | POA: Diagnosis not present

## 2019-11-09 NOTE — Progress Notes (Signed)
Remote pacemaker transmission.   

## 2019-11-09 NOTE — Patient Outreach (Signed)
Aging Gracefully Program  11/09/2019  Sean Best December 26, 1933 [id number]  [facility] Evaluation Interviewer made contact with patient. Aging Gracefully survey completed.   Interviewer will send referral to OT for follow up.   [facility] Management Assistant

## 2019-11-10 DIAGNOSIS — Z992 Dependence on renal dialysis: Secondary | ICD-10-CM | POA: Diagnosis not present

## 2019-11-10 DIAGNOSIS — N186 End stage renal disease: Secondary | ICD-10-CM | POA: Diagnosis not present

## 2019-11-10 DIAGNOSIS — N2581 Secondary hyperparathyroidism of renal origin: Secondary | ICD-10-CM | POA: Diagnosis not present

## 2019-11-14 ENCOUNTER — Other Ambulatory Visit (HOSPITAL_COMMUNITY): Payer: Self-pay | Admitting: Internal Medicine

## 2019-11-14 DIAGNOSIS — K859 Acute pancreatitis without necrosis or infection, unspecified: Secondary | ICD-10-CM | POA: Diagnosis not present

## 2019-11-14 DIAGNOSIS — K922 Gastrointestinal hemorrhage, unspecified: Secondary | ICD-10-CM | POA: Diagnosis not present

## 2019-11-14 DIAGNOSIS — R197 Diarrhea, unspecified: Secondary | ICD-10-CM | POA: Diagnosis not present

## 2019-11-14 DIAGNOSIS — E1151 Type 2 diabetes mellitus with diabetic peripheral angiopathy without gangrene: Secondary | ICD-10-CM | POA: Diagnosis not present

## 2019-11-14 DIAGNOSIS — I120 Hypertensive chronic kidney disease with stage 5 chronic kidney disease or end stage renal disease: Secondary | ICD-10-CM | POA: Diagnosis not present

## 2019-11-14 DIAGNOSIS — M1030 Gout due to renal impairment, unspecified site: Secondary | ICD-10-CM | POA: Diagnosis not present

## 2019-11-14 DIAGNOSIS — M7989 Other specified soft tissue disorders: Secondary | ICD-10-CM | POA: Diagnosis not present

## 2019-11-14 DIAGNOSIS — E1122 Type 2 diabetes mellitus with diabetic chronic kidney disease: Secondary | ICD-10-CM | POA: Diagnosis not present

## 2019-11-14 DIAGNOSIS — D631 Anemia in chronic kidney disease: Secondary | ICD-10-CM | POA: Diagnosis not present

## 2019-11-14 DIAGNOSIS — N186 End stage renal disease: Secondary | ICD-10-CM | POA: Diagnosis not present

## 2019-11-15 DIAGNOSIS — N2581 Secondary hyperparathyroidism of renal origin: Secondary | ICD-10-CM | POA: Diagnosis not present

## 2019-11-15 DIAGNOSIS — N186 End stage renal disease: Secondary | ICD-10-CM | POA: Diagnosis not present

## 2019-11-15 DIAGNOSIS — Z992 Dependence on renal dialysis: Secondary | ICD-10-CM | POA: Diagnosis not present

## 2019-11-16 ENCOUNTER — Ambulatory Visit (HOSPITAL_COMMUNITY)
Admission: RE | Admit: 2019-11-16 | Discharge: 2019-11-16 | Disposition: A | Discharge status: Home / Self Care | Payer: Medicare HMO | Source: Ambulatory Visit | Attending: Internal Medicine | Admitting: Internal Medicine

## 2019-11-16 ENCOUNTER — Other Ambulatory Visit: Payer: Self-pay

## 2019-11-16 DIAGNOSIS — M7989 Other specified soft tissue disorders: Secondary | ICD-10-CM | POA: Insufficient documentation

## 2019-11-16 NOTE — Progress Notes (Signed)
Lower extremity venous has been completed.   Preliminary results in CV Proc.   Abram Sander 11/16/2019 1:10 PM

## 2019-11-17 DIAGNOSIS — N2581 Secondary hyperparathyroidism of renal origin: Secondary | ICD-10-CM | POA: Diagnosis not present

## 2019-11-17 DIAGNOSIS — N186 End stage renal disease: Secondary | ICD-10-CM | POA: Diagnosis not present

## 2019-11-17 DIAGNOSIS — Z992 Dependence on renal dialysis: Secondary | ICD-10-CM | POA: Diagnosis not present

## 2019-11-20 DIAGNOSIS — N2581 Secondary hyperparathyroidism of renal origin: Secondary | ICD-10-CM | POA: Diagnosis not present

## 2019-11-20 DIAGNOSIS — N186 End stage renal disease: Secondary | ICD-10-CM | POA: Diagnosis not present

## 2019-11-20 DIAGNOSIS — Z992 Dependence on renal dialysis: Secondary | ICD-10-CM | POA: Diagnosis not present

## 2019-11-23 DIAGNOSIS — I120 Hypertensive chronic kidney disease with stage 5 chronic kidney disease or end stage renal disease: Secondary | ICD-10-CM | POA: Diagnosis not present

## 2019-11-23 DIAGNOSIS — K922 Gastrointestinal hemorrhage, unspecified: Secondary | ICD-10-CM | POA: Diagnosis not present

## 2019-11-23 DIAGNOSIS — R197 Diarrhea, unspecified: Secondary | ICD-10-CM | POA: Diagnosis not present

## 2019-11-23 DIAGNOSIS — E1122 Type 2 diabetes mellitus with diabetic chronic kidney disease: Secondary | ICD-10-CM | POA: Diagnosis not present

## 2019-11-23 DIAGNOSIS — K859 Acute pancreatitis without necrosis or infection, unspecified: Secondary | ICD-10-CM | POA: Diagnosis not present

## 2019-11-23 DIAGNOSIS — D631 Anemia in chronic kidney disease: Secondary | ICD-10-CM | POA: Diagnosis not present

## 2019-11-23 DIAGNOSIS — M1030 Gout due to renal impairment, unspecified site: Secondary | ICD-10-CM | POA: Diagnosis not present

## 2019-11-23 DIAGNOSIS — E1151 Type 2 diabetes mellitus with diabetic peripheral angiopathy without gangrene: Secondary | ICD-10-CM | POA: Diagnosis not present

## 2019-11-23 DIAGNOSIS — N186 End stage renal disease: Secondary | ICD-10-CM | POA: Diagnosis not present

## 2019-11-24 ENCOUNTER — Other Ambulatory Visit: Payer: Self-pay | Admitting: Occupational Therapy

## 2019-11-24 DIAGNOSIS — Z992 Dependence on renal dialysis: Secondary | ICD-10-CM | POA: Diagnosis not present

## 2019-11-24 DIAGNOSIS — N186 End stage renal disease: Secondary | ICD-10-CM | POA: Diagnosis not present

## 2019-11-24 DIAGNOSIS — N2581 Secondary hyperparathyroidism of renal origin: Secondary | ICD-10-CM | POA: Diagnosis not present

## 2019-11-27 DIAGNOSIS — N2581 Secondary hyperparathyroidism of renal origin: Secondary | ICD-10-CM | POA: Diagnosis not present

## 2019-11-27 DIAGNOSIS — N186 End stage renal disease: Secondary | ICD-10-CM | POA: Diagnosis not present

## 2019-11-27 DIAGNOSIS — Z992 Dependence on renal dialysis: Secondary | ICD-10-CM | POA: Diagnosis not present

## 2019-11-28 DIAGNOSIS — J9601 Acute respiratory failure with hypoxia: Secondary | ICD-10-CM | POA: Diagnosis not present

## 2019-11-28 DIAGNOSIS — M109 Gout, unspecified: Secondary | ICD-10-CM | POA: Diagnosis not present

## 2019-11-28 DIAGNOSIS — Z950 Presence of cardiac pacemaker: Secondary | ICD-10-CM | POA: Diagnosis not present

## 2019-11-28 DIAGNOSIS — E1122 Type 2 diabetes mellitus with diabetic chronic kidney disease: Secondary | ICD-10-CM | POA: Diagnosis not present

## 2019-11-29 DIAGNOSIS — Z992 Dependence on renal dialysis: Secondary | ICD-10-CM | POA: Diagnosis not present

## 2019-11-29 DIAGNOSIS — N2581 Secondary hyperparathyroidism of renal origin: Secondary | ICD-10-CM | POA: Diagnosis not present

## 2019-11-29 DIAGNOSIS — N186 End stage renal disease: Secondary | ICD-10-CM | POA: Diagnosis not present

## 2019-11-30 DIAGNOSIS — D631 Anemia in chronic kidney disease: Secondary | ICD-10-CM | POA: Diagnosis not present

## 2019-11-30 DIAGNOSIS — K922 Gastrointestinal hemorrhage, unspecified: Secondary | ICD-10-CM | POA: Diagnosis not present

## 2019-11-30 DIAGNOSIS — I120 Hypertensive chronic kidney disease with stage 5 chronic kidney disease or end stage renal disease: Secondary | ICD-10-CM | POA: Diagnosis not present

## 2019-11-30 DIAGNOSIS — R197 Diarrhea, unspecified: Secondary | ICD-10-CM | POA: Diagnosis not present

## 2019-11-30 DIAGNOSIS — E1122 Type 2 diabetes mellitus with diabetic chronic kidney disease: Secondary | ICD-10-CM | POA: Diagnosis not present

## 2019-11-30 DIAGNOSIS — E1129 Type 2 diabetes mellitus with other diabetic kidney complication: Secondary | ICD-10-CM | POA: Diagnosis not present

## 2019-11-30 DIAGNOSIS — E1151 Type 2 diabetes mellitus with diabetic peripheral angiopathy without gangrene: Secondary | ICD-10-CM | POA: Diagnosis not present

## 2019-11-30 DIAGNOSIS — Z992 Dependence on renal dialysis: Secondary | ICD-10-CM | POA: Diagnosis not present

## 2019-11-30 DIAGNOSIS — N186 End stage renal disease: Secondary | ICD-10-CM | POA: Diagnosis not present

## 2019-11-30 DIAGNOSIS — M1030 Gout due to renal impairment, unspecified site: Secondary | ICD-10-CM | POA: Diagnosis not present

## 2019-11-30 DIAGNOSIS — K859 Acute pancreatitis without necrosis or infection, unspecified: Secondary | ICD-10-CM | POA: Diagnosis not present

## 2019-12-01 DIAGNOSIS — N2581 Secondary hyperparathyroidism of renal origin: Secondary | ICD-10-CM | POA: Diagnosis not present

## 2019-12-01 DIAGNOSIS — Z992 Dependence on renal dialysis: Secondary | ICD-10-CM | POA: Diagnosis not present

## 2019-12-01 DIAGNOSIS — N186 End stage renal disease: Secondary | ICD-10-CM | POA: Diagnosis not present

## 2019-12-04 ENCOUNTER — Other Ambulatory Visit: Payer: Self-pay

## 2019-12-04 ENCOUNTER — Other Ambulatory Visit: Payer: Self-pay | Admitting: Occupational Therapy

## 2019-12-04 DIAGNOSIS — N2581 Secondary hyperparathyroidism of renal origin: Secondary | ICD-10-CM | POA: Diagnosis not present

## 2019-12-04 DIAGNOSIS — N186 End stage renal disease: Secondary | ICD-10-CM | POA: Diagnosis not present

## 2019-12-04 DIAGNOSIS — Z992 Dependence on renal dialysis: Secondary | ICD-10-CM | POA: Diagnosis not present

## 2019-12-04 NOTE — Patient Outreach (Addendum)
Addendum: 02/19/2020-RN contacted OT on Feb 22, 2020 to inform pt has passed away. AG OT episode will be resolved.         [facility]  OT Initial Visit  12/04/2019  Sean Best March 14, 1933 [id number]  Visit:  1- Initial Visit  Start Time:  0830 End Time:  0905 Total Minutes:  85  CCAP: Typical Daily Routine: Typical Daily Routine:: Has dialysis M, W, F for 4 hours. Is at home the remainder of the time. Daughter reports pt is alone during the day, has family in at night. Pt gives himself plenty of time to get up and dressed and get to his car for his appts.   What Types Of Care Problems Are You Having Throughout The Day?: Pt reports difficulty with showering, getting in/out of the shower and the house. Has difficulty getting up from low surfaces such as the chair or commode.   What Kind Of Help Do You Receive?: Family assists as needed. Just finished with HH services   Do You Think You Need Other Types Of Help?: Yes-home modifications to be safe  What Do You Think Would Make Everyday Life Easier For You?: Home modifications to assist with showering and getting in and out of the house  What Is A Good Day Like?: Getting up and getting ready and making it to appts on time.   What Is A Bad Day Like?: Being unable to go to appts because of diarrhea   Do You Have Time For Yourself?: Yes  Patient Reported Equipment: Patient Reported Equipment Currently Used: Rollator, Education officer, environmental Other Equipment:: Has a ramp in the back but does not use Functional Mobility-Walking Indoors/Getting Around the House:   Functional Mobility-Walk A Block: Walk A Block: Unable To Do Importance Of Learning New Strategies:: Not At All   Functional Mobility-Maintain Balance While Showering: Maintaining Balance While Showering: Moderate Difficulty Do You:: Use A Device Importance Of Learning New Strategies:: Very Much Other Comments:: Pt with fear of falling, holds a grab bar during  showering Observation: Maintain Balance While Showering: N/O Safety: Extreme Risk Efficiency: Somewhat Intervention: Yes   Functional Mobility-Stooping, Crouching, Kneeling To Retreive Item: Stooping, Crouching, or Kneeling To Retrieve Item: A Lot Of Difficulty Do You:: Use Personal Assistance Importance Of Learning New Strategies:: Moderate Other Comments:: Does not have a reacher Observation: Stoop, Crouch, or Kneel: Independent With Pain, Difficulty, Or Use Of Device Safety: Moderate/Extreme Risk Efficiency: Somewhat Intervention: Yes   Functional Mobility-Bending From Standing Position To Pick Up Clothing Off The Floor: Bending Over From Standing Position To Pick Up Clothing Off The Floor: A Lot Of Difficulty Do You:: Use Personal Assistance Importance Of Learning New Strategies:: Not At All   Functional Mobility-Reaching For Items Above Shoulder Level: Reaching For Items Above Shoulder Level: A Lot Of Difficulty Do You:: Use Personal Assistance Importance Of Learning New Strategies:: Very Much Other Comments:: Has a RC tear in his RUE Observation: Reaching For Items Above Shoulder Level: Moderate Assistance Safety: A Little Risk Efficiency: Not At All Intervention: Yes   Functional Mobility-Climb 1 Flight Of Stairs: Climb 1 Flight Of Stairs: A Lot Of Difficulty Do You:: No Device/No Assistance Importance Of Learning New Strategies:: Very Much Other Comments:: Goes out front steps and holds railing, takes a lot of time Observation: Climb One Flight Of Stairs: N/O Safety: Moderate/Extreme Risk Efficiency: Not At All Intervention: Yes   Functional Mobility-Move In And Out Of Chair: Move In and Out Of A Chair: A Little Difficulty Do  You:: No Device/No Assistance Importance Of Learning New Strategies:: A Little Observation: Move In And Out Of Chair: Independent With Pain, Difficulty, Or Use Of Device   Functional Mobility-Move In And Out Of Bed: Move In and Out Of Bed:  N/A   Functional Mobility-Move In And Out Of Bath/Shower: Move In And Out Of A Bath/Shower: A Lot Of Difficulty Do You:: Use A Device Importance Of Learning New Strategies:: Very Much Observation: Move In And Out Of Bath/Shower: N/O Safety: Extreme Risk Efficiency: Not At All Intervention: Yes   Functional Mobility-Get On And Off Toilet: Getting Up From The Floor: Unable To Do Do You:: Use Personal Assistance Importance Of Learning New Strategies:: Not At All   Functional Mobility-Into And Out Of Car, Not Including Driving: Into  And Out Of Car, Not Including Driving: A Little Difficulty Do You:: No Device/No Assistance Importance Of Learning New Strategies:: Not At All    Activities of Daily Living-Bathing/Showering: ADL-Bathing/Showering: Moderate Difficulty Do You:: No Device/No Assistance Importance Of Learning New Strategies: Very Much Other Comments:: Holds grab bar during showering ADL Observation: Bathing/Showering: N/O Safety: Moderate/Extreme Risk Efficiency: Not At All Intervention: Yes   Activities of Daily Living-Personal Hygiene and Grooming: Personal Hygiene and Grooming: No Difficulty   Activities of Daily Living-Toilet Hygiene: Toilet Hygiene: No Difficulty   Activities of Daily Living-Put On And Take Off Undergarments (Incl. Fasteners): Put On And Take Off Undergarments (Incl. Fasteners): A Little Difficulty Do You:: No Device/No Assistance Importance Of Learning New Strategies: Not At All   Activities of Daily Living-Put On And Take Off Shirt/Dress/Coat (Incl. Fasteners): Put On And Take Off Shirt/Dress/Coat (Incl. Fasteners): A Little Difficulty Do You:: No Device/No Assistance Importance Of Learning New Strategies: Not At All   Activities of Daily Living-Put On And Take Off Socks And Shoes: Put On And Take Off Socks And  Shoes: A Little Difficulty Do You:: No Device/No Assistance Importance Of Learning New Strategies: Not At All   Activities  of Daily Living-Feed Self: Feed Self: No Difficulty   Activities of Daily Living-Rest And Sleep: Rest and Sleep: No Difficulty   Activities of Daily Living-Sexual Activity: Sexual  Activity: N/A   Instrumental Activities of Daily Living-Light Homemaking (Laundry, Straightening Up, Vacuuming):  Do Light Homemaking (Laundry, Straightening Up, Vacuuming): Unable To Do Do You:: Use Personal Assistance Importance Of Learning New Strategies: Not At All   Instrumental Activities of Daily Living-Making A Bed: Making a Bed: N/A   Instrumental Activities of Daily Living-Washing Dishes By Hand While Standing At The Sink: Washing Dishes By Hand While Standing At The Sink: N/A   Instrumental Activities of Daily Living-Grocery Shopping: Do Grocery Shopping: N/A   Instrumental Activities of Daily Living-Use Telephone: Use Telephone: No Difficulty   Instrumental Activities of Daily Living-Financial Management: Financial Management: No Difficulty   Instrumental Activities of Daily Living-Medications: Take Medications: No Difficulty   Instrumental Activities of Daily Living-Health Management And Maintenance: Health Management & Maintenance: No Difficulty   Instrumental Activities of Daily Living-Meal Preparation and Clean-Up: Meal Preparation and Clean-Up: Moderate Difficulty Do You:: Use Personal Assistance Importance Of Learning New Strategies: Not At All   Instrumental Activities of Daily Living-Provide Care For Others/Pets: Care For Others/Pets: N/A   Instrumental Activities of Daily Living-Take Part In Organized Social Activities: Take Part In Organized Social Activities: Unable To Do Importance Of Learning New Strategies: Not At All   Instrumental Activities of Daily Living-Leisure Participation: Leisure Participation: N/A   Instrumental Activities of Daily Living-Employment/Volunteer  Activities: Employment/Volunteer Activities: N/A   Readiness To Change Score:  Readiness to  Change Score: 7  Home Environment Assessment: Outside Home Entry:: Pt has 4 large steps in the front with railings on each side that he uses to get into/out of the house to his truck. Has a ramp in the back but does not use because it's farther to get to his truck.  Bathroom:: Bathroom is small, no room for a tub bench for transfers. Pt has difficulty stepping over the edge of the tub. Also has difficulty getting on/off the commode, no grab bars available  Other Home Environment Concerns:: Daughter is concerned about possible mold/mildew in bathroom and kitchen  Durable Medical Equipment:  Rollator  Patient Education: Education Provided: Yes Education Details: Checklist for Safety provided to pt Person(s) Educated: Patient, Child(ren) Comprehension: Verbalized Understanding  Goals: Goals Addressed            This Visit's Progress   . Patient Stated       Pt will improve ability to get into/out of the house safely and independently.     . Patient Stated       Pt will improve ability to get into and out of the shower and perform bathing tasks safely and independently.     . Patient Stated       Patient will improve ability to get up and down from the commode safely and independently.     . Patient Stated       Pt will demonstrate independence in use of AE to improve ability to reach for and pick up items from the floor and from overhead cabinets.        Post Clinical Reasoning: Clinician View Of Client Situation:: Client and daughter present for initial visit. Client is aware of physical limitations and of need to take his time to remain safe with mobility. Client has limited insight into strategies or techniques that may assist with safe mobility and independence. Prefers to complete things his "usual" way versus utilizing things already in place like ramp in the back of the house.  Client View Of His/Her Situation:: Client is aware of safety risks, gives himself plenty of  time for tasks. Client would like assistance for bathroom usage due to fear of falling. Has limited insight into why he has to take dialysis.  Next Visit Plan:: Begin with goal 1, provide brochure on how to get up from a fall.    Guadelupe Sabin, OTR/L  [phone] 12/04/2019

## 2019-12-06 DIAGNOSIS — N186 End stage renal disease: Secondary | ICD-10-CM | POA: Diagnosis not present

## 2019-12-06 DIAGNOSIS — N2581 Secondary hyperparathyroidism of renal origin: Secondary | ICD-10-CM | POA: Diagnosis not present

## 2019-12-06 DIAGNOSIS — Z992 Dependence on renal dialysis: Secondary | ICD-10-CM | POA: Diagnosis not present

## 2019-12-11 DIAGNOSIS — N2581 Secondary hyperparathyroidism of renal origin: Secondary | ICD-10-CM | POA: Diagnosis not present

## 2019-12-11 DIAGNOSIS — Z992 Dependence on renal dialysis: Secondary | ICD-10-CM | POA: Diagnosis not present

## 2019-12-11 DIAGNOSIS — N186 End stage renal disease: Secondary | ICD-10-CM | POA: Diagnosis not present

## 2019-12-13 DIAGNOSIS — Z992 Dependence on renal dialysis: Secondary | ICD-10-CM | POA: Diagnosis not present

## 2019-12-13 DIAGNOSIS — N186 End stage renal disease: Secondary | ICD-10-CM | POA: Diagnosis not present

## 2019-12-13 DIAGNOSIS — N2581 Secondary hyperparathyroidism of renal origin: Secondary | ICD-10-CM | POA: Diagnosis not present

## 2019-12-15 DIAGNOSIS — Z992 Dependence on renal dialysis: Secondary | ICD-10-CM | POA: Diagnosis not present

## 2019-12-15 DIAGNOSIS — N186 End stage renal disease: Secondary | ICD-10-CM | POA: Diagnosis not present

## 2019-12-15 DIAGNOSIS — N2581 Secondary hyperparathyroidism of renal origin: Secondary | ICD-10-CM | POA: Diagnosis not present

## 2019-12-19 DIAGNOSIS — Z992 Dependence on renal dialysis: Secondary | ICD-10-CM | POA: Diagnosis not present

## 2019-12-19 DIAGNOSIS — N186 End stage renal disease: Secondary | ICD-10-CM | POA: Diagnosis not present

## 2019-12-19 DIAGNOSIS — N2581 Secondary hyperparathyroidism of renal origin: Secondary | ICD-10-CM | POA: Diagnosis not present

## 2019-12-20 DIAGNOSIS — Z992 Dependence on renal dialysis: Secondary | ICD-10-CM | POA: Diagnosis not present

## 2019-12-20 DIAGNOSIS — N186 End stage renal disease: Secondary | ICD-10-CM | POA: Diagnosis not present

## 2019-12-20 DIAGNOSIS — N2581 Secondary hyperparathyroidism of renal origin: Secondary | ICD-10-CM | POA: Diagnosis not present

## 2019-12-21 ENCOUNTER — Ambulatory Visit: Payer: Medicare HMO

## 2019-12-22 DIAGNOSIS — N186 End stage renal disease: Secondary | ICD-10-CM | POA: Diagnosis not present

## 2019-12-22 DIAGNOSIS — N2581 Secondary hyperparathyroidism of renal origin: Secondary | ICD-10-CM | POA: Diagnosis not present

## 2019-12-22 DIAGNOSIS — Z992 Dependence on renal dialysis: Secondary | ICD-10-CM | POA: Diagnosis not present

## 2019-12-25 DIAGNOSIS — N186 End stage renal disease: Secondary | ICD-10-CM | POA: Diagnosis not present

## 2019-12-25 DIAGNOSIS — Z992 Dependence on renal dialysis: Secondary | ICD-10-CM | POA: Diagnosis not present

## 2019-12-25 DIAGNOSIS — N2581 Secondary hyperparathyroidism of renal origin: Secondary | ICD-10-CM | POA: Diagnosis not present

## 2019-12-26 ENCOUNTER — Ambulatory Visit (INDEPENDENT_AMBULATORY_CARE_PROVIDER_SITE_OTHER): Payer: Medicare HMO | Admitting: Physician Assistant

## 2019-12-26 ENCOUNTER — Other Ambulatory Visit: Payer: Self-pay

## 2019-12-26 ENCOUNTER — Encounter: Payer: Self-pay | Admitting: Physician Assistant

## 2019-12-26 VITALS — BP 173/98 | HR 81 | Temp 98.0°F | Resp 20 | Ht 65.0 in | Wt 180.9 lb

## 2019-12-26 DIAGNOSIS — T82590A Other mechanical complication of surgically created arteriovenous fistula, initial encounter: Secondary | ICD-10-CM | POA: Diagnosis not present

## 2019-12-26 NOTE — H&P (View-Only) (Signed)
[facility] [place] HISTORY AND PHYSICAL   History of Present Illness:  Patient is a 86 y.o. year old male who presents for examination of ulcerated skin over aneurysmal areas of his left radiocephalic.  It was first created  about 7 years ago.  He states he has had times of prolonged bleeding after HD.  He denise symptoms of steal.  He denise fever and chills.  He is on HD MWF.    Past Medical History:  Diagnosis Date  . Anemia   . Arthritis    "knees" (04/11/2016)  . Blind left eye    S/P trauma  . Chronic total occlusion of artery of the extremities (HCC)    pt not aware of this  . Claudication ([place])   . ESRD on dialysis [facility])    "MWF; [name]" ((04/10/2016)  . ESRD on peritoneal dialysis [facility])    Started dialysis around April 2015 per son.  Has been doing peritoneal dialysis at home.     . Gout   . Hyperlipidemia   . Hypertension   . Overweight(278.02)   . Presence of permanent cardiac pacemaker    medtronic  . Prostate cancer ([place]) 1990s  . Shingles   . Stroke [facility]) ~1998   denies residual on 04/11/2016  . Type II diabetes mellitus (HCC)    diet controlled  . Umbilical hernia 1/66/0630    Past Surgical History:  Procedure Laterality Date  . A/V FISTULAGRAM N/A 11/01/2018   Procedure: A/V FISTULAGRAM;  Surgeon: Serafina Mitchell, MD;  Location: [place] [facility];  Service: Cardiovascular;  Laterality: N/A;  . AV FISTULA PLACEMENT, RADIOCEPHALIC  16/03/930   Left arm  . CAPD INSERTION N/A 03/06/2013   Procedure: LAPAROSCOPIC INSERTION CONTINUOUS AMBULATORY PERITONEAL DIALYSIS  (CAPD) CATHETER;  Surgeon: Edward Jolly, MD;  Location: [place];  Service: General;  Laterality: N/A;  . CATARACT EXTRACTION Right   . CHOLECYSTECTOMY  12/02/2017   LAPROSCOPIC  . CORONARY STENT INTERVENTION N/A 02/11/2018   Procedure: CORONARY STENT INTERVENTION;  Surgeon: Sherren Mocha, MD;  Location: [place] [facility];  Service: Cardiovascular;  Laterality: N/A;   . ESOPHAGOGASTRODUODENOSCOPY (EGD) WITH PROPOFOL N/A 10/18/2019   Procedure: ESOPHAGOGASTRODUODENOSCOPY (EGD) WITH PROPOFOL;  Surgeon: Arta Silence, MD;  Location: [facility];  Service: Endoscopy;  Laterality: N/A;  . EUS N/A 10/18/2019   Procedure: FULL UPPER ENDOSCOPIC ULTRASOUND (EUS) RADIAL;  Surgeon: Arta Silence, MD;  Location: [facility];  Service: Endoscopy;  Laterality: N/A;  . EYE SURGERY Left 1970s   for eye injury  . HERNIA REPAIR    . INSERTION PROSTATE RADIATION SEED    . LAPAROSCOPIC CHOLECYSTECTOMY SINGLE SITE WITH INTRAOPERATIVE CHOLANGIOGRAM N/A 12/02/2017   Procedure: LAPAROSCOPIC CHOLECYSTECTOMY;  Surgeon: Michael Boston, MD;  Location: [place];  Service: General;  Laterality: N/A;  . LEFT HEART CATH AND CORONARY ANGIOGRAPHY N/A 02/11/2018   Procedure: LEFT HEART CATH AND CORONARY ANGIOGRAPHY;  Surgeon: Sherren Mocha, MD;  Location: [place] [facility];  Service: Cardiovascular;  Laterality: N/A;  . PACEMAKER IMPLANT N/A 04/20/2017   Procedure: PACEMAKER IMPLANT;  Surgeon: Evans Lance, MD;  Location: [place] [facility];  Service: Cardiovascular;  Laterality: N/A;  . PERIPHERAL VASCULAR BALLOON ANGIOPLASTY  11/01/2018   Procedure: PERIPHERAL VASCULAR BALLOON ANGIOPLASTY;  Surgeon: Serafina Mitchell, MD;  Location: [place] [facility];  Service: Cardiovascular;;  Lt lower arm fistula   . UMBILICAL HERNIA REPAIR N/A 03/06/2013   Procedure: HERNIA REPAIR UMBILICAL WITH MESH;  Surgeon: Edward Jolly, MD;  [facility];  Service: General;  Laterality: N/A;     Social History Social History   Tobacco Use  . Smoking status: Never Smoker  . Smokeless tobacco: Never Used  Vaping Use  . Vaping Use: Never used  Substance Use Topics  . Alcohol use: No  . Drug use: No    Family History Family History  Problem Relation Age of Onset  . Cancer Mother   . Heart disease Mother   . Cancer Father     Allergies  Allergies  Allergen Reactions  . Cardura  [Doxazosin Mesylate] Other (See Comments)    Hallucinations  . Tape Dermatitis    Paper tape ok to use      Current Outpatient Medications  Medication Sig Dispense Refill  . acetaminophen (TYLENOL) 500 MG tablet Take 1,000 mg by mouth every 6 (six) hours as needed for mild pain or headache.    . allopurinol (ZYLOPRIM) 100 MG tablet Take 100 mg by mouth daily.      Marland Kitchen amLODipine (NORVASC) 5 MG tablet Take 5 mg by mouth daily.    Marland Kitchen aspirin 81 MG chewable tablet Chew 1 tablet (81 mg total) by mouth daily. 60 tablet 2  . atorvastatin (LIPITOR) 40 MG tablet Take 40 mg by mouth every evening.   3  . Blood Glucose Calibration (TRUE METRIX LEVEL 2) Normal SOLN     . Blood Glucose Monitoring Suppl (TRUE METRIX METER) w/Device KIT     . calcitRIOL (ROCALTROL) 0.5 MCG capsule Take 2 capsules (1 mcg total) by mouth every Monday, Wednesday, and Friday with hemodialysis. 12 capsule 0  . calcium acetate (PHOSLO) 667 MG capsule Take 3 capsules (2,001 mg total) by mouth 3 (three) times daily with meals. 90 capsule 0  . cinacalcet (SENSIPAR) 90 MG tablet Take 90 mg by mouth every Monday, Wednesday, and Friday.     . Darbepoetin Alfa (ARANESP) 60 MCG/0.3ML SOSY injection Inject 0.3 mLs (60 mcg total) into the vein every Thursday with hemodialysis. 4.2 mL 0  . ELDERBERRY PO Take 100 mg by mouth daily. Gummie    . ferrous sulfate 325 (65 FE) MG EC tablet Take 325 mg by mouth daily with breakfast.     . isosorbide mononitrate (IMDUR) 30 MG 24 hr tablet Take 30 mg by mouth 2 (two) times daily.    Marland Kitchen lidocaine-prilocaine (EMLA) cream Apply 1 application topically as needed (for port access).   11  . loperamide (IMODIUM) 2 MG capsule Take 1 capsule (2 mg total) by mouth every 6 (six) hours as needed for diarrhea or loose stools. 20 capsule 0  . Methoxy PEG-Epoetin Beta (MIRCERA IJ) Mircera    . metoprolol tartrate (LOPRESSOR) 25 MG tablet Take 0.5 tablets (12.5 mg total) by mouth 2 (two) times daily. 60 tablet 0  .  multivitamin (RENA-VIT) TABS tablet Take 1 tablet by mouth 2 (two) times daily.     . nitroGLYCERIN (NITROSTAT) 0.4 MG SL tablet Place 1 tablet (0.4 mg total) under the tongue every 5 (five) minutes as needed for chest pain. 25 tablet 4  . pantoprazole (PROTONIX) 40 MG tablet Take 40 mg by mouth daily as needed (Heartburn).     . sevelamer carbonate (RENVELA) 800 MG tablet     . traMADol (ULTRAM) 50 MG tablet Take 50 mg by mouth 2 (two) times daily as needed for moderate pain.     . TRUE METRIX BLOOD GLUCOSE TEST test strip SMARTSIG:Via  Meter    . TRUEplus Lancets 30G MISC     . VELPHORO 500 MG chewable tablet Chew 500 mg by mouth 3 (three) times daily with meals.   3   No current facility-administered medications for this visit.    ROS:   General:  No weight loss, Fever, chills  HEENT: No recent headaches, no nasal bleeding, no visual changes, no sore throat  Neurologic: No dizziness, blackouts, seizures. No recent symptoms of stroke or mini- stroke. No recent episodes of slurred speech, or temporary blindness.  Cardiac: No recent episodes of chest pain/pressure, no shortness of breath at rest.  No shortness of breath with exertion.  Denies history of atrial fibrillation or irregular heartbeat  Vascular: No history of rest pain in feet.  No history of claudication.  No history of non-healing ulcer, No history of DVT   Pulmonary: No home oxygen, no productive cough, no hemoptysis,  No asthma or wheezing  Musculoskeletal:  '[ ]'  Arthritis, '[ ]'  Low back pain,  '[ ]'  Joint pain  Hematologic:No history of hypercoagulable state.  No history of easy bleeding.  No history of anemia  Gastrointestinal: No hematochezia or melena,  No gastroesophageal reflux, no trouble swallowing  Urinary: '[ ]'  chronic Kidney disease, [x ] on HD - [x ] MWF or '[ ]'  TTHS, '[ ]'  Burning with urination, '[ ]'  Frequent urination, '[ ]'  Difficulty urinating;   Skin: No rashes  Psychological: No history of anxiety,  No  history of depression   Physical Examination  Vitals:   12/26/19 1117  BP: (!) 173/98  Pulse: 81  Resp: 20  Temp: 98 F (36.7 C)  TempSrc: Temporal  SpO2: 100%  Weight: 180 lb 14.4 oz (82.1 kg)  Height: '5\' 5"'  (1.651 m)    Body mass index is 30.1 kg/m.  General:  Alert and oriented, no acute distress HEENT: Normal Neck: No bruit or JVD Pulmonary: Clear to auscultation bilaterally Cardiac: Regular Rate and Rhythm without murmur Gastrointestinal: Soft, non-tender, non-distended, no mass, no scars Skin: No rash Extremity Pulses:  Palpable thrill in fistula, area distal fistula with non mobile eroded thinning skin.   Musculoskeletal: No deformity or edema  Neurologic: Upper and lower extremity motor 5/5 and symmetric     ASSESSMENT:   Left radiocephaic fistula with eroded skin that has become thin and events of prolonged bleeding.   The fistula has been functionale for about 7 years.    He has a pacemaker on the right chest wall.  He may need a TDC placed depending on surgical intervention to preform the plication of the distal aneurysmal area.      PLAN:  Left forearm fistula plication of distal aneurysmal area and possible TDC on the left.  He has HD MWF and is not on anticoagulation.    Roxy Horseman PA-C [facility] [place] Office: [phone]  MD in clinic [place]

## 2019-12-26 NOTE — Progress Notes (Signed)
[facility] [place] HISTORY AND PHYSICAL   History of Present Illness:  Patient is a 86 y.o. year old male who presents for examination of ulcerated skin over aneurysmal areas of his left radiocephalic.  It was first created  about 7 years ago.  He states he has had times of prolonged bleeding after HD.  He denise symptoms of steal.  He denise fever and chills.  He is on HD MWF.    Past Medical History:  Diagnosis Date  . Anemia   . Arthritis    "knees" (04/11/2016)  . Blind left eye    S/P trauma  . Chronic total occlusion of artery of the extremities (HCC)    pt not aware of this  . Claudication ([place])   . ESRD on dialysis [facility])    "MWF; [name]" ((04/10/2016)  . ESRD on peritoneal dialysis [facility])    Started dialysis around April 2015 per son.  Has been doing peritoneal dialysis at home.     . Gout   . Hyperlipidemia   . Hypertension   . Overweight(278.02)   . Presence of permanent cardiac pacemaker    medtronic  . Prostate cancer ([place]) 1990s  . Shingles   . Stroke [facility]) ~1998   denies residual on 04/11/2016  . Type II diabetes mellitus (HCC)    diet controlled  . Umbilical hernia 9/62/9528    Past Surgical History:  Procedure Laterality Date  . A/V FISTULAGRAM N/A 11/01/2018   Procedure: A/V FISTULAGRAM;  Surgeon: Serafina Mitchell, MD;  Location: [place] [facility];  Service: Cardiovascular;  Laterality: N/A;  . AV FISTULA PLACEMENT, RADIOCEPHALIC  41/32/4401   Left arm  . CAPD INSERTION N/A 03/06/2013   Procedure: LAPAROSCOPIC INSERTION CONTINUOUS AMBULATORY PERITONEAL DIALYSIS  (CAPD) CATHETER;  Surgeon: Edward Jolly, MD;  Location: [place];  Service: General;  Laterality: N/A;  . CATARACT EXTRACTION Right   . CHOLECYSTECTOMY  12/02/2017   LAPROSCOPIC  . CORONARY STENT INTERVENTION N/A 02/11/2018   Procedure: CORONARY STENT INTERVENTION;  Surgeon: Sherren Mocha, MD;  Location: [place] [facility];  Service: Cardiovascular;  Laterality: N/A;   . ESOPHAGOGASTRODUODENOSCOPY (EGD) WITH PROPOFOL N/A 10/18/2019   Procedure: ESOPHAGOGASTRODUODENOSCOPY (EGD) WITH PROPOFOL;  Surgeon: Arta Silence, MD;  [facility]: [facility];  Service: Endoscopy;  Laterality: N/A;  . EUS N/A 10/18/2019   Procedure: FULL UPPER ENDOSCOPIC ULTRASOUND (EUS) RADIAL;  Surgeon: Arta Silence, MD;  Location: [facility];  Service: Endoscopy;  Laterality: N/A;  . EYE SURGERY Left 1970s   for eye injury  . HERNIA REPAIR    . INSERTION PROSTATE RADIATION SEED    . LAPAROSCOPIC CHOLECYSTECTOMY SINGLE SITE WITH INTRAOPERATIVE CHOLANGIOGRAM N/A 12/02/2017   Procedure: LAPAROSCOPIC CHOLECYSTECTOMY;  Surgeon: Michael Boston, MD;  Location: [place];  Service: General;  Laterality: N/A;  . LEFT HEART CATH AND CORONARY ANGIOGRAPHY N/A 02/11/2018   Procedure: LEFT HEART CATH AND CORONARY ANGIOGRAPHY;  Surgeon: Sherren Mocha, MD;  Location: [place] [facility];  Service: Cardiovascular;  Laterality: N/A;  . PACEMAKER IMPLANT N/A 04/20/2017   Procedure: PACEMAKER IMPLANT;  Surgeon: Evans Lance, MD;  Location: [place] [facility];  Service: Cardiovascular;  Laterality: N/A;  . PERIPHERAL VASCULAR BALLOON ANGIOPLASTY  11/01/2018   Procedure: PERIPHERAL VASCULAR BALLOON ANGIOPLASTY;  Surgeon: Serafina Mitchell, MD;  Location: [place] [facility];  Service: Cardiovascular;;  Lt lower arm fistula   . UMBILICAL HERNIA REPAIR N/A 03/06/2013   Procedure: HERNIA REPAIR UMBILICAL WITH MESH;  Surgeon: Edward Jolly, MD;  Location: [facility] OR;  Service: General;  Laterality: N/A;     Social History Social History   Tobacco Use  . Smoking status: Never Smoker  . Smokeless tobacco: Never Used  Vaping Use  . Vaping Use: Never used  Substance Use Topics  . Alcohol use: No  . Drug use: No    Family History Family History  Problem Relation Age of Onset  . Cancer Mother   . Heart disease Mother   . Cancer Father     Allergies  Allergies  Allergen Reactions  . Cardura  [Doxazosin Mesylate] Other (See Comments)    Hallucinations  . Tape Dermatitis    Paper tape ok to use      Current Outpatient Medications  Medication Sig Dispense Refill  . acetaminophen (TYLENOL) 500 MG tablet Take 1,000 mg by mouth every 6 (six) hours as needed for mild pain or headache.    . allopurinol (ZYLOPRIM) 100 MG tablet Take 100 mg by mouth daily.      Marland Kitchen amLODipine (NORVASC) 5 MG tablet Take 5 mg by mouth daily.    Marland Kitchen aspirin 81 MG chewable tablet Chew 1 tablet (81 mg total) by mouth daily. 60 tablet 2  . atorvastatin (LIPITOR) 40 MG tablet Take 40 mg by mouth every evening.   3  . Blood Glucose Calibration (TRUE METRIX LEVEL 2) Normal SOLN     . Blood Glucose Monitoring Suppl (TRUE METRIX METER) w/Device KIT     . calcitRIOL (ROCALTROL) 0.5 MCG capsule Take 2 capsules (1 mcg total) by mouth every Monday, Wednesday, and Friday with hemodialysis. 12 capsule 0  . calcium acetate (PHOSLO) 667 MG capsule Take 3 capsules (2,001 mg total) by mouth 3 (three) times daily with meals. 90 capsule 0  . cinacalcet (SENSIPAR) 90 MG tablet Take 90 mg by mouth every Monday, Wednesday, and Friday.     . Darbepoetin Alfa (ARANESP) 60 MCG/0.3ML SOSY injection Inject 0.3 mLs (60 mcg total) into the vein every Thursday with hemodialysis. 4.2 mL 0  . ELDERBERRY PO Take 100 mg by mouth daily. Gummie    . ferrous sulfate 325 (65 FE) MG EC tablet Take 325 mg by mouth daily with breakfast.     . isosorbide mononitrate (IMDUR) 30 MG 24 hr tablet Take 30 mg by mouth 2 (two) times daily.    Marland Kitchen lidocaine-prilocaine (EMLA) cream Apply 1 application topically as needed (for port access).   11  . loperamide (IMODIUM) 2 MG capsule Take 1 capsule (2 mg total) by mouth every 6 (six) hours as needed for diarrhea or loose stools. 20 capsule 0  . Methoxy PEG-Epoetin Beta (MIRCERA IJ) Mircera    . metoprolol tartrate (LOPRESSOR) 25 MG tablet Take 0.5 tablets (12.5 mg total) by mouth 2 (two) times daily. 60 tablet 0  .  multivitamin (RENA-VIT) TABS tablet Take 1 tablet by mouth 2 (two) times daily.     . nitroGLYCERIN (NITROSTAT) 0.4 MG SL tablet Place 1 tablet (0.4 mg total) under the tongue every 5 (five) minutes as needed for chest pain. 25 tablet 4  . pantoprazole (PROTONIX) 40 MG tablet Take 40 mg by mouth daily as needed (Heartburn).     . sevelamer carbonate (RENVELA) 800 MG tablet     . traMADol (ULTRAM) 50 MG tablet Take 50 mg by mouth 2 (two) times daily as needed for moderate pain.     . TRUE METRIX BLOOD GLUCOSE TEST test strip SMARTSIG:Via  Meter    . TRUEplus Lancets 30G MISC     . VELPHORO 500 MG chewable tablet Chew 500 mg by mouth 3 (three) times daily with meals.   3   No current facility-administered medications for this visit.    ROS:   General:  No weight loss, Fever, chills  HEENT: No recent headaches, no nasal bleeding, no visual changes, no sore throat  Neurologic: No dizziness, blackouts, seizures. No recent symptoms of stroke or mini- stroke. No recent episodes of slurred speech, or temporary blindness.  Cardiac: No recent episodes of chest pain/pressure, no shortness of breath at rest.  No shortness of breath with exertion.  Denies history of atrial fibrillation or irregular heartbeat  Vascular: No history of rest pain in feet.  No history of claudication.  No history of non-healing ulcer, No history of DVT   Pulmonary: No home oxygen, no productive cough, no hemoptysis,  No asthma or wheezing  Musculoskeletal:  '[ ]'  Arthritis, '[ ]'  Low back pain,  '[ ]'  Joint pain  Hematologic:No history of hypercoagulable state.  No history of easy bleeding.  No history of anemia  Gastrointestinal: No hematochezia or melena,  No gastroesophageal reflux, no trouble swallowing  Urinary: '[ ]'  chronic Kidney disease, [x ] on HD - [x ] MWF or '[ ]'  TTHS, '[ ]'  Burning with urination, '[ ]'  Frequent urination, '[ ]'  Difficulty urinating;   Skin: No rashes  Psychological: No history of anxiety,  No  history of depression   Physical Examination  Vitals:   12/26/19 1117  BP: (!) 173/98  Pulse: 81  Resp: 20  Temp: 98 F (36.7 C)  TempSrc: Temporal  SpO2: 100%  Weight: 180 lb 14.4 oz (82.1 kg)  Height: '5\' 5"'  (1.651 m)    Body mass index is 30.1 kg/m.  General:  Alert and oriented, no acute distress HEENT: Normal Neck: No bruit or JVD Pulmonary: Clear to auscultation bilaterally Cardiac: Regular Rate and Rhythm without murmur Gastrointestinal: Soft, non-tender, non-distended, no mass, no scars Skin: No rash Extremity Pulses:  Palpable thrill in fistula, area distal fistula with non mobile eroded thinning skin.   Musculoskeletal: No deformity or edema  Neurologic: Upper and lower extremity motor 5/5 and symmetric     ASSESSMENT:   Left radiocephaic fistula with eroded skin that has become thin and events of prolonged bleeding.   The fistula has been functionale for about 7 years.    He has a pacemaker on the right chest wall.  He may need a TDC placed depending on surgical intervention to preform the plication of the distal aneurysmal area.      PLAN:  Left forearm fistula plication of distal aneurysmal area and possible TDC on the left.  He has HD MWF and is not on anticoagulation.    Roxy Horseman PA-C [facility] [place] Office: [phone]  MD in clinic [place]

## 2019-12-27 DIAGNOSIS — N2581 Secondary hyperparathyroidism of renal origin: Secondary | ICD-10-CM | POA: Diagnosis not present

## 2019-12-27 DIAGNOSIS — N186 End stage renal disease: Secondary | ICD-10-CM | POA: Diagnosis not present

## 2019-12-27 DIAGNOSIS — Z992 Dependence on renal dialysis: Secondary | ICD-10-CM | POA: Diagnosis not present

## 2019-12-28 ENCOUNTER — Other Ambulatory Visit: Payer: Self-pay

## 2019-12-28 DIAGNOSIS — J9601 Acute respiratory failure with hypoxia: Secondary | ICD-10-CM | POA: Diagnosis not present

## 2019-12-28 DIAGNOSIS — Z950 Presence of cardiac pacemaker: Secondary | ICD-10-CM | POA: Diagnosis not present

## 2019-12-28 DIAGNOSIS — E1122 Type 2 diabetes mellitus with diabetic chronic kidney disease: Secondary | ICD-10-CM | POA: Diagnosis not present

## 2019-12-28 DIAGNOSIS — M109 Gout, unspecified: Secondary | ICD-10-CM | POA: Diagnosis not present

## 2019-12-28 NOTE — Patient Outreach (Signed)
A[facility]fully Program  12/28/2019  Sean Best [birth year] [id number]   Care Coordination:   Placed call to patient with no answer.  Placed call to daughter.. Scheduled home visit for 01/09/2020   Provided my contact information for daughter and encouraged her to call me if she needs to change appointment time or date.    [name], RN, BSN, CEN [facility] [employer] [phone]

## 2019-12-29 DIAGNOSIS — N2581 Secondary hyperparathyroidism of renal origin: Secondary | ICD-10-CM | POA: Diagnosis not present

## 2019-12-29 DIAGNOSIS — Z992 Dependence on renal dialysis: Secondary | ICD-10-CM | POA: Diagnosis not present

## 2019-12-29 DIAGNOSIS — N186 End stage renal disease: Secondary | ICD-10-CM | POA: Diagnosis not present

## 2019-12-30 ENCOUNTER — Other Ambulatory Visit (HOSPITAL_COMMUNITY)
Admission: RE | Admit: 2019-12-30 | Discharge: 2019-12-30 | Disposition: A | Discharge status: Home / Self Care | Payer: Medicare HMO | Source: Ambulatory Visit | Attending: Vascular Surgery | Admitting: Vascular Surgery

## 2019-12-30 DIAGNOSIS — Z20822 Contact with and (suspected) exposure to covid-19: Secondary | ICD-10-CM | POA: Diagnosis not present

## 2019-12-30 DIAGNOSIS — Z01812 Encounter for preprocedural laboratory examination: Secondary | ICD-10-CM | POA: Insufficient documentation

## 2019-12-31 DIAGNOSIS — N186 End stage renal disease: Secondary | ICD-10-CM | POA: Diagnosis not present

## 2019-12-31 DIAGNOSIS — Z992 Dependence on renal dialysis: Secondary | ICD-10-CM | POA: Diagnosis not present

## 2019-12-31 DIAGNOSIS — E1129 Type 2 diabetes mellitus with other diabetic kidney complication: Secondary | ICD-10-CM | POA: Diagnosis not present

## 2019-12-31 LAB — SARS CORONAVIRUS 2 (TAT 6-24 HRS): SARS Coronavirus 2: NEGATIVE

## 2020-01-01 ENCOUNTER — Encounter: Payer: Self-pay | Admitting: Internal Medicine

## 2020-01-01 DIAGNOSIS — N2581 Secondary hyperparathyroidism of renal origin: Secondary | ICD-10-CM | POA: Diagnosis not present

## 2020-01-01 DIAGNOSIS — Z992 Dependence on renal dialysis: Secondary | ICD-10-CM | POA: Diagnosis not present

## 2020-01-01 DIAGNOSIS — N186 End stage renal disease: Secondary | ICD-10-CM | POA: Diagnosis not present

## 2020-01-01 NOTE — Progress Notes (Signed)
[place] DEVICE PROGRAMMING  Patient Information: Name:  [name]  DOB:  19-Dec-1933  MRN:  [id number]    Patient: Sean Best  DOB: 02/26/1934  MRN: [id number]   Planned Procedure: Plication of left radial cephalic aneurysm, possible tunnel dialysis catheter  Surgeon: Dr. Sherren Mocha Early  Date of Procedure: 01/02/20  Time: Surgery 0927. Arrival 0700  Cautery will be used.  Position during surgery: Supine   Please send documentation back to:  [facility] (Fax # [phone])   Device Information:  Clinic EP Physician:  Cristopher Peru, MD   Device Type:  Pacemaker Manufacturer and Phone #:  Medtronic: [phone] Pacemaker Dependent?:  Yes.   Date of Last Device Check:  11/08/19 Normal Device Function?:  Yes.    Electrophysiologist's Recommendations:   Have magnet available.  Provide continuous ECG monitoring when magnet is used or reprogramming is to be performed.   Procedure may interfere with device function.  Magnet should be placed over device during procedure.  Per Device Clinic Standing Orders, York Ram, RN  11:56 AM 01/01/2020

## 2020-01-01 NOTE — Progress Notes (Signed)
Anesthesia Chart Review:  Follows with cardiology for history of CAD status post DES x2 in December 2019, CHB s/p Medtronic PPM, HTN, HFpEF, mild-mod AS (mean gradient 12.4 mmHg by echo 2/20), CVA.  Last seen by Dr. Lovena Le 10/24/2018.  Per note, Medtronic DDD PPM was working normally.  He was asymptomatic from cardiovascular standpoint.  Last remote device check was 11/08/2019.  Per report he had normal device function.  4 episodes of NSVT longest 23 beats, rate 160-180s.  Dr. Lovena Le reviewed, no recommended changes.  Recent admission July 2021 for idiopathic pancreatitis.  He was also noted to have worsened anemia due to acute GI bleed (baseline hemoglobin 9.5).  He received 1 unit PRBC.  He followed up outpatient with GI and underwent EGD that showed moderate chronic pancreatitis.  Continues to follow with Dr. Paulita Fujita.  ESRD on HD MWF.  Will need day of surgery labs and evaluation.  EKG 09/20/2019: Ventricular paced rhythm.  Rate 69.  TTE 04/29/2018: 1. The left ventricle has normal systolic function with an ejection  fraction of 60-65%. The cavity size was normal. There is severely  increased left ventricular wall thickness. Left ventricular diastolic  Doppler parameters are indeterminate.  2. The right ventricle has normal systolic function. The cavity was  normal. There is mildly increased right ventricular wall thickness.  3. Left atrial size was severely dilated.  4. Right atrial size was mildly dilated.  5. The mitral valve is degenerative. Mild calcification of the mitral  valve leaflet. There is moderate mitral annular calcification present.  6. The tricuspid valve is normal in structure.  7. The aortic valve is tricuspid Moderate calcification of the aortic  valve. Aortic valve regurgitation is trivial by color flow Doppler.  mild-moderate stenosis of the aortic valve.  8. The aortic root and ascending aorta are normal in size and structure.  9. The inferior vena cava was  normal in size with <50% respiratory  variability.  10. When compared to the prior study: 01/2018 - LVEF 60-65%, mild to  moderate aortic stenosis.   Nuclear stress 03/15/2018:  The left ventricular ejection fraction is mildly decreased (45-54%).  Nuclear stress EF: 46%.  There was no ST segment deviation noted during stress.  The study is normal.  This is a low risk study.   Normal pharmacologic nuclear stress test with no evidence for prior infarct or ischemia. LVEF calculated at 46% but visually appears better. Correlation with an echocardiogram is recommended.  Cath and PCI 02/11/2018: 1.  Severe single-vessel coronary artery disease involving the left circumflex/obtuse marginal branch, treated successfully with 2 drug-eluting stents 2.  Mild nonobstructive stenosis of the RCA and LAD 3.  Normal LV systolic function and mild to moderate aortic stenosis by noninvasive assessment  Recommendations: Dual antiplatelet therapy with aspirin and ticagrelor for 12 months without interruption.  Ticagrelor 180 mg is administered in the cardiac Cath Lab.   Wynonia Musty [facility]/Anesthesiology Phone [phone] 01/01/2020 12:21 PM

## 2020-01-01 NOTE — Progress Notes (Signed)
PCP:  Seward Carol, MD Cardiologist:  Crissie Sickles  EKG:  09/21/19 CXR:  03/29/19 ECHO:  04/29/18 Stress Test:  03/15/18 Cardiac Cath:  02/11/18  OSA/CPAP:  Sleep study 05/11/18.  Patient wears CPAP nightly.  Blood thinners:  Continue ASA  Patient is diabetic, but is diet controlled.   Patient denies shortness of breath, fever, cough, and chest pain at PAT appointment.  Patient verbalized understanding of instructions provided today at the PAT appointment.  Patient asked to review instructions at home and day of surgery.

## 2020-01-01 NOTE — Anesthesia Preprocedure Evaluation (Addendum)
Anesthesia Evaluation  Patient identified by MRN, date of birth, ID band Patient awake    Reviewed: Allergy & Precautions, NPO status , Patient's Chart, lab work & pertinent test results  Airway Mallampati: II  TM Distance: >3 FB Neck ROM: Full    Dental  (+) Edentulous Upper, Edentulous Lower   Pulmonary    breath sounds clear to auscultation       Cardiovascular hypertension,  Rhythm:Regular Rate:Normal     Neuro/Psych    GI/Hepatic   Endo/Other  diabetes  Renal/GU      Musculoskeletal   Abdominal   Peds  Hematology   Anesthesia Other Findings   Reproductive/Obstetrics                            Anesthesia Physical Anesthesia Plan  ASA: III  Anesthesia Plan: General   Post-op Pain Management:    Induction: Intravenous  PONV Risk Score and Plan: Ondansetron and Dexamethasone  Airway Management Planned: LMA  Additional Equipment:   Intra-op Plan:   Post-operative Plan:   Informed Consent: I have reviewed the patients History and Physical, chart, labs and discussed the procedure including the risks, benefits and alternatives for the proposed anesthesia with the patient or authorized representative who has indicated his/her understanding and acceptance.       Plan Discussed with: Anesthesiologist and CRNA  Anesthesia Plan Comments: (PAT note by Karoline Caldwell, PA-C:  Follows with cardiology for history of CAD status post DES x2 in December 2019, CHB s/p Medtronic PPM, HTN, HFpEF, mild-mod AS (mean gradient 12.4 mmHg by echo 2/20), CVA.  Last seen by Dr. Lovena Le 10/24/2018.  Per note, Medtronic DDD PPM was working normally.  He was asymptomatic from cardiovascular standpoint.  Last remote device check was 11/08/2019.  Per report he had normal device function.  4 episodes of NSVT longest 23 beats, rate 160-180s.  Dr. Lovena Le reviewed, no recommended changes.  Recent admission July  2021 for idiopathic pancreatitis.  He was also noted to have worsened anemia due to acute GI bleed (baseline hemoglobin 9.5).  He received 1 unit PRBC.  He followed up outpatient with GI and underwent EGD that showed moderate chronic pancreatitis.  Continues to follow with Dr. Paulita Fujita.  ESRD on HD MWF.  Will need day of surgery labs and evaluation.  EKG 09/20/2019: Ventricular paced rhythm.  Rate 69.  TTE 04/29/2018: 1. The left ventricle has normal systolic function with an ejection  fraction of 60-65%. The cavity size was normal. There is severely  increased left ventricular wall thickness. Left ventricular diastolic  Doppler parameters are indeterminate.  2. The right ventricle has normal systolic function. The cavity was  normal. There is mildly increased right ventricular wall thickness.  3. Left atrial size was severely dilated.  4. Right atrial size was mildly dilated.  5. The mitral valve is degenerative. Mild calcification of the mitral  valve leaflet. There is moderate mitral annular calcification present.  6. The tricuspid valve is normal in structure.  7. The aortic valve is tricuspid Moderate calcification of the aortic  valve. Aortic valve regurgitation is trivial by color flow Doppler.  mild-moderate stenosis of the aortic valve.  8. The aortic root and ascending aorta are normal in size and structure.  9. The inferior vena cava was normal in size with <50% respiratory  variability.  10. When compared to the prior study: 01/2018 - LVEF 60-65%, mild to  moderate aortic stenosis.  Nuclear stress 03/15/2018: The left ventricular ejection fraction is mildly decreased (45-54%). Nuclear stress EF: 46%. There was no ST segment deviation noted during stress. The study is normal. This is a low risk study.   Normal pharmacologic nuclear stress test with no evidence for prior infarct or ischemia. LVEF calculated at 46% but visually appears better. Correlation with an  echocardiogram is recommended.  Cath and PCI 02/11/2018: 1.  Severe single-vessel coronary artery disease involving the left circumflex/obtuse marginal branch, treated successfully with 2 drug-eluting stents 2.  Mild nonobstructive stenosis of the RCA and LAD 3.  Normal LV systolic function and mild to moderate aortic stenosis by noninvasive assessment  Recommendations: Dual antiplatelet therapy with aspirin and ticagrelor for 12 months without interruption.  Ticagrelor 180 mg is administered in the cardiac Cath Lab.   )       Anesthesia Quick Evaluation

## 2020-01-02 ENCOUNTER — Ambulatory Visit (HOSPITAL_COMMUNITY): Payer: Medicare HMO | Admitting: Physician Assistant

## 2020-01-02 ENCOUNTER — Other Ambulatory Visit: Payer: Self-pay

## 2020-01-02 ENCOUNTER — Ambulatory Visit (HOSPITAL_COMMUNITY)
Admission: RE | Admit: 2020-01-02 | Discharge: 2020-01-02 | Disposition: A | Discharge status: Home / Self Care | Payer: Medicare HMO | Attending: Vascular Surgery | Admitting: Vascular Surgery

## 2020-01-02 ENCOUNTER — Encounter (HOSPITAL_COMMUNITY)
Admission: RE | Disposition: A | Discharge status: Home / Self Care | Payer: Self-pay | Source: Home / Self Care | Attending: Vascular Surgery

## 2020-01-02 ENCOUNTER — Encounter (HOSPITAL_COMMUNITY): Payer: Self-pay | Admitting: Vascular Surgery

## 2020-01-02 DIAGNOSIS — Z992 Dependence on renal dialysis: Secondary | ICD-10-CM | POA: Diagnosis not present

## 2020-01-02 DIAGNOSIS — T82898A Other specified complication of vascular prosthetic devices, implants and grafts, initial encounter: Secondary | ICD-10-CM | POA: Diagnosis not present

## 2020-01-02 DIAGNOSIS — Z950 Presence of cardiac pacemaker: Secondary | ICD-10-CM | POA: Insufficient documentation

## 2020-01-02 DIAGNOSIS — T82510A Breakdown (mechanical) of surgically created arteriovenous fistula, initial encounter: Secondary | ICD-10-CM | POA: Diagnosis not present

## 2020-01-02 DIAGNOSIS — Y841 Kidney dialysis as the cause of abnormal reaction of the patient, or of later complication, without mention of misadventure at the time of the procedure: Secondary | ICD-10-CM | POA: Diagnosis not present

## 2020-01-02 DIAGNOSIS — I120 Hypertensive chronic kidney disease with stage 5 chronic kidney disease or end stage renal disease: Secondary | ICD-10-CM | POA: Diagnosis not present

## 2020-01-02 DIAGNOSIS — E785 Hyperlipidemia, unspecified: Secondary | ICD-10-CM | POA: Diagnosis not present

## 2020-01-02 DIAGNOSIS — N186 End stage renal disease: Secondary | ICD-10-CM | POA: Diagnosis not present

## 2020-01-02 DIAGNOSIS — Z888 Allergy status to other drugs, medicaments and biological substances status: Secondary | ICD-10-CM | POA: Insufficient documentation

## 2020-01-02 DIAGNOSIS — E1122 Type 2 diabetes mellitus with diabetic chronic kidney disease: Secondary | ICD-10-CM | POA: Diagnosis not present

## 2020-01-02 DIAGNOSIS — N185 Chronic kidney disease, stage 5: Secondary | ICD-10-CM | POA: Diagnosis not present

## 2020-01-02 HISTORY — PX: FISTULA SUPERFICIALIZATION: SHX6341

## 2020-01-02 LAB — GLUCOSE, CAPILLARY
Glucose-Capillary: 63 mg/dL — ABNORMAL LOW (ref 70–99)
Glucose-Capillary: 137 mg/dL — ABNORMAL HIGH (ref 70–99)
Glucose-Capillary: 73 mg/dL (ref 70–99)

## 2020-01-02 LAB — POCT I-STAT, CHEM 8
BUN: 17 mg/dL (ref 8–23)
Calcium, Ion: 0.94 mmol/L — ABNORMAL LOW (ref 1.15–1.40)
Chloride: 95 mmol/L — ABNORMAL LOW (ref 98–111)
Creatinine, Ser: 5.3 mg/dL — ABNORMAL HIGH (ref 0.61–1.24)
Glucose, Bld: 82 mg/dL (ref 70–99)
HCT: 36 % — ABNORMAL LOW (ref 39.0–52.0)
Hemoglobin: 12.2 g/dL — ABNORMAL LOW (ref 13.0–17.0)
Potassium: 3.6 mmol/L (ref 3.5–5.1)
Sodium: 136 mmol/L (ref 135–145)
TCO2: 31 mmol/L (ref 22–32)

## 2020-01-02 SURGERY — FISTULA SUPERFICIALIZATION
Anesthesia: General | Laterality: Left

## 2020-01-02 MED ORDER — SODIUM CHLORIDE 0.9 % IV SOLN
INTRAVENOUS | Status: AC
  Filled 2020-01-02: qty 1.2

## 2020-01-02 MED ORDER — 0.9 % SODIUM CHLORIDE (POUR BTL) OPTIME
TOPICAL | Status: DC | PRN
  Administered 2020-01-02: 1000 mL

## 2020-01-02 MED ORDER — ORAL CARE MOUTH RINSE: 15.0000 mL | Freq: Once | OROMUCOSAL | Status: AC

## 2020-01-02 MED ORDER — MIDAZOLAM HCL 2 MG/2ML IJ SOLN
INTRAMUSCULAR | Status: AC
  Filled 2020-01-02: qty 2

## 2020-01-02 MED ORDER — LIDOCAINE-EPINEPHRINE 0.5 %-1:200000 IJ SOLN
INTRAMUSCULAR | Status: AC
  Filled 2020-01-02: qty 1

## 2020-01-02 MED ORDER — ONDANSETRON HCL 4 MG/2ML IJ SOLN
INTRAMUSCULAR | Status: AC
  Filled 2020-01-02: qty 2

## 2020-01-02 MED ORDER — MIDAZOLAM HCL 5 MG/5ML IJ SOLN
INTRAMUSCULAR | Status: DC | PRN
  Administered 2020-01-02 (×2): 1 mg via INTRAVENOUS

## 2020-01-02 MED ORDER — DEXAMETHASONE SODIUM PHOSPHATE 10 MG/ML IJ SOLN
INTRAMUSCULAR | Status: DC | PRN
  Administered 2020-01-02: 5 mg via INTRAVENOUS

## 2020-01-02 MED ORDER — PROPOFOL 10 MG/ML IV BOLUS
INTRAVENOUS | Status: AC
  Filled 2020-01-02: qty 20

## 2020-01-02 MED ORDER — CEFAZOLIN SODIUM-DEXTROSE 2-4 GM/100ML-% IV SOLN
INTRAVENOUS | Status: AC
  Filled 2020-01-02: qty 100

## 2020-01-02 MED ORDER — CHLORHEXIDINE GLUCONATE 4 % EX LIQD: 60.0000 mL | Freq: Once | CUTANEOUS | Status: DC

## 2020-01-02 MED ORDER — DEXAMETHASONE SODIUM PHOSPHATE 10 MG/ML IJ SOLN
INTRAMUSCULAR | Status: AC
  Filled 2020-01-02: qty 1

## 2020-01-02 MED ORDER — SODIUM CHLORIDE 0.9 % IV SOLN: INTRAVENOUS | Status: DC

## 2020-01-02 MED ORDER — PROPOFOL 10 MG/ML IV BOLUS
INTRAVENOUS | Status: DC | PRN
  Administered 2020-01-02: 20 mg via INTRAVENOUS
  Administered 2020-01-02: 40 mg via INTRAVENOUS

## 2020-01-02 MED ORDER — SODIUM CHLORIDE 0.9 % IV SOLN: INTRAVENOUS | Status: DC | PRN

## 2020-01-02 MED ORDER — CHLORHEXIDINE GLUCONATE 0.12 % MT SOLN
OROMUCOSAL | Status: AC
  Administered 2020-01-02: 15 mL via OROMUCOSAL
  Filled 2020-01-02: qty 15

## 2020-01-02 MED ORDER — OXYCODONE-ACETAMINOPHEN 5-325 MG PO TABS
1.0000 | ORAL_TABLET | Freq: Four times a day (QID) | ORAL | 0 refills | Status: AC | PRN
Start: 2020-01-02 — End: ?

## 2020-01-02 MED ORDER — FENTANYL CITRATE (PF) 100 MCG/2ML IJ SOLN: 25.0000 ug | INTRAMUSCULAR | Status: DC | PRN

## 2020-01-02 MED ORDER — ONDANSETRON HCL 4 MG/2ML IJ SOLN
INTRAMUSCULAR | Status: DC | PRN
  Administered 2020-01-02: 4 mg via INTRAVENOUS

## 2020-01-02 MED ORDER — LIDOCAINE 2% (20 MG/ML) 5 ML SYRINGE
INTRAMUSCULAR | Status: AC
  Filled 2020-01-02: qty 5

## 2020-01-02 MED ORDER — OXYCODONE HCL 5 MG PO TABS: 5.0000 mg | ORAL_TABLET | Freq: Once | ORAL | Status: DC | PRN

## 2020-01-02 MED ORDER — FENTANYL CITRATE (PF) 250 MCG/5ML IJ SOLN
INTRAMUSCULAR | Status: AC
  Filled 2020-01-02: qty 5

## 2020-01-02 MED ORDER — FENTANYL CITRATE (PF) 250 MCG/5ML IJ SOLN
INTRAMUSCULAR | Status: DC | PRN
Start: 2020-01-02 — End: 2020-01-02
  Administered 2020-01-02: 25 ug via INTRAVENOUS

## 2020-01-02 MED ORDER — DEXTROSE 50 % IV SOLN
INTRAVENOUS | Status: AC
  Filled 2020-01-02: qty 50

## 2020-01-02 MED ORDER — OXYCODONE HCL 5 MG/5ML PO SOLN: 5.0000 mg | Freq: Once | ORAL | Status: DC | PRN

## 2020-01-02 MED ORDER — ONDANSETRON HCL 4 MG/2ML IJ SOLN: 4.0000 mg | Freq: Once | INTRAMUSCULAR | Status: DC | PRN

## 2020-01-02 MED ORDER — CEFAZOLIN SODIUM-DEXTROSE 2-4 GM/100ML-% IV SOLN
2.0000 g | INTRAVENOUS | Status: AC
  Administered 2020-01-02: 2 g via INTRAVENOUS

## 2020-01-02 MED ORDER — CHLORHEXIDINE GLUCONATE 0.12 % MT SOLN: 15.0000 mL | Freq: Once | OROMUCOSAL | Status: AC

## 2020-01-02 MED ORDER — LIDOCAINE-EPINEPHRINE 0.5 %-1:200000 IJ SOLN
INTRAMUSCULAR | Status: DC | PRN
  Administered 2020-01-02: 50 mL

## 2020-01-02 SURGICAL SUPPLY — 55 items
ARMBAND PINK RESTRICT EXTREMIT (MISCELLANEOUS) ×3 IMPLANT
BAG DECANTER FOR FLEXI CONT (MISCELLANEOUS) ×3 IMPLANT
BIOPATCH RED 1 DISK 7.0 (GAUZE/BANDAGES/DRESSINGS) ×2 IMPLANT
BIOPATCH RED 1IN DISK 7.0MM (GAUZE/BANDAGES/DRESSINGS) ×1
CANISTER SUCT 3000ML PPV (MISCELLANEOUS) ×3 IMPLANT
CATH PALINDROME-P 19CM W/VT (CATHETERS) IMPLANT
CATH PALINDROME-P 23CM W/VT (CATHETERS) IMPLANT
CATH PALINDROME-P 28CM W/VT (CATHETERS) IMPLANT
CLIP LIGATING EXTRA MED SLVR (CLIP) ×3 IMPLANT
CLIP LIGATING EXTRA SM BLUE (MISCELLANEOUS) ×3 IMPLANT
COVER PROBE W GEL 5X96 (DRAPES) ×3 IMPLANT
COVER SURGICAL LIGHT HANDLE (MISCELLANEOUS) ×3 IMPLANT
COVER WAND RF STERILE (DRAPES) ×3 IMPLANT
DECANTER SPIKE VIAL GLASS SM (MISCELLANEOUS) ×3 IMPLANT
DERMABOND ADVANCED (GAUZE/BANDAGES/DRESSINGS) ×2
DERMABOND ADVANCED .7 DNX12 (GAUZE/BANDAGES/DRESSINGS) ×1 IMPLANT
DRAPE C-ARM 42X72 X-RAY (DRAPES) ×3 IMPLANT
DRAPE CHEST BREAST 15X10 FENES (DRAPES) ×3 IMPLANT
ELECT REM PT RETURN 9FT ADLT (ELECTROSURGICAL) ×3
ELECTRODE REM PT RTRN 9FT ADLT (ELECTROSURGICAL) ×1 IMPLANT
GAUZE 4X4 16PLY RFD (DISPOSABLE) ×3 IMPLANT
GLOVE BIOGEL PI IND STRL 6 (GLOVE) ×1 IMPLANT
GLOVE BIOGEL PI IND STRL 6.5 (GLOVE) ×1 IMPLANT
GLOVE BIOGEL PI INDICATOR 6 (GLOVE) ×2
GLOVE BIOGEL PI INDICATOR 6.5 (GLOVE) ×2
GLOVE SS BIOGEL STRL SZ 7.5 (GLOVE) ×1 IMPLANT
GLOVE SUPERSENSE BIOGEL SZ 7.5 (GLOVE) ×2
GLOVE SURG SS PI 6.5 STRL IVOR (GLOVE) ×3 IMPLANT
GOWN STRL REUS W/ TWL LRG LVL3 (GOWN DISPOSABLE) ×3 IMPLANT
GOWN STRL REUS W/TWL LRG LVL3 (GOWN DISPOSABLE) ×9
KIT BASIN OR (CUSTOM PROCEDURE TRAY) ×3 IMPLANT
KIT PALINDROME-P 55CM (CATHETERS) IMPLANT
KIT TURNOVER KIT B (KITS) ×3 IMPLANT
NEEDLE 18GX1X1/2 (RX/OR ONLY) (NEEDLE) ×3 IMPLANT
NEEDLE 22X1 1/2 (OR ONLY) (NEEDLE) IMPLANT
NEEDLE HYPO 25GX1X1/2 BEV (NEEDLE) ×3 IMPLANT
NS IRRIG 1000ML POUR BTL (IV SOLUTION) ×3 IMPLANT
PACK CV ACCESS (CUSTOM PROCEDURE TRAY) ×3 IMPLANT
PACK SURGICAL SETUP 50X90 (CUSTOM PROCEDURE TRAY) ×3 IMPLANT
PAD ARMBOARD 7.5X6 YLW CONV (MISCELLANEOUS) ×6 IMPLANT
SOAP 2 % CHG 4 OZ (WOUND CARE) ×3 IMPLANT
SUT ETHILON 3 0 PS 1 (SUTURE) ×3 IMPLANT
SUT PROLENE 5 0 C 1 24 (SUTURE) ×3 IMPLANT
SUT PROLENE 6 0 CC (SUTURE) ×3 IMPLANT
SUT VIC AB 3-0 SH 27 (SUTURE) ×3
SUT VIC AB 3-0 SH 27X BRD (SUTURE) ×1 IMPLANT
SUT VICRYL 4-0 PS2 18IN ABS (SUTURE) ×3 IMPLANT
SYR 10ML LL (SYRINGE) ×3 IMPLANT
SYR 20ML LL LF (SYRINGE) ×3 IMPLANT
SYR 5ML LL (SYRINGE) ×6 IMPLANT
SYR CONTROL 10ML LL (SYRINGE) ×3 IMPLANT
TOWEL GREEN STERILE (TOWEL DISPOSABLE) ×6 IMPLANT
TOWEL GREEN STERILE FF (TOWEL DISPOSABLE) ×3 IMPLANT
UNDERPAD 30X36 HEAVY ABSORB (UNDERPADS AND DIAPERS) ×3 IMPLANT
WATER STERILE IRR 1000ML POUR (IV SOLUTION) ×3 IMPLANT

## 2020-01-02 NOTE — Discharge Instructions (Signed)
[facility]  Discharge Instructions  AV Fistula or Graft Surgery for Dialysis Access  Please refer to the following instructions for your post-procedure care. Your surgeon or physician assistant will discuss any changes with you.  Activity  You may drive the day following your surgery, if you are comfortable and no longer taking prescription pain medication. Resume full activity as the soreness in your incision resolves.  Bathing/Showering  You may shower after you go home. Keep your incision dry for 48 hours. Do not soak in a bathtub, hot tub, or swim until the incision heals completely. You may not shower if you have a hemodialysis catheter.  Incision Care  Clean your incision with mild soap and water after 48 hours. Pat the area dry with a clean towel. You do not need a bandage unless otherwise instructed. Do not apply any ointments or creams to your incision. You may have skin glue on your incision. Do not peel it off. It will come off on its own in about one week. Your arm may swell a bit after surgery. To reduce swelling use pillows to elevate your arm so it is above your heart. Your doctor will tell you if you need to lightly wrap your arm with an ACE bandage.  Diet  Resume your normal diet. There are not special food restrictions following this procedure. In order to heal from your surgery, it is CRITICAL to get adequate nutrition. Your body requires vitamins, minerals, and protein. Vegetables are the best source of vitamins and minerals. Vegetables also provide the perfect balance of protein. Processed food has little nutritional value, so try to avoid this.  Medications  Resume taking all of your medications. If your incision is causing pain, you may take over-the counter pain relievers such as acetaminophen (Tylenol). If you were prescribed a stronger pain medication, please be aware these medications can cause nausea and constipation. Prevent  nausea by taking the medication with a snack or meal. Avoid constipation by drinking plenty of fluids and eating foods with high amount of fiber, such as fruits, vegetables, and grains.  Do not take Tylenol if you are taking prescription pain medications.  Follow up Your surgeon may want to see you in the office following your access surgery. If so, this will be arranged at the time of your surgery.  Please call us immediately for any of the following conditions:  . Increased pain, redness, drainage (pus) from your incision site . Fever of 101 degrees or higher . Severe or worsening pain at your incision site . Hand pain or numbness. .  Reduce your risk of vascular disease:  . Stop smoking. If you would like help, call [facility] at 1-800-QUIT-NOW 574-422-2730) or [place] at 602-255-4928  . Manage your cholesterol . Maintain a desired weight . Control your diabetes . Keep your blood pressure down  Dialysis  It will take several weeks to several months for your new dialysis access to be ready for use. Your surgeon will determine when it is okay to use it. Your nephrologist will continue to direct your dialysis. You can continue to use your Permcath until your new access is ready for use.   01/02/2020 Broc Caspers [phone] [birth year]  Surgeon(s): Maryland Stell, Arvilla Meres, MD  Procedure(s): PLICATION OF LEFT RADIAL CEPHALIC ANEURYSM  x May stick graft immediately   May stick graft on designated area only:        If you have any questions, please call the  office at [phone].

## 2020-01-02 NOTE — Anesthesia Procedure Notes (Signed)
Procedure Name: MAC Date/Time: 01/02/2020 9:35 AM Performed by: Michele Rockers, CRNA Pre-anesthesia Checklist: Patient identified, Emergency Drugs available, Suction available, Timeout performed and Patient being monitored Patient Re-evaluated:Patient Re-evaluated prior to induction Oxygen Delivery Method: Simple face mask

## 2020-01-02 NOTE — Anesthesia Postprocedure Evaluation (Signed)
Anesthesia Post Note  Patient: Sean Best  Procedure(s) Performed: PLICATION OF LEFT RADIAL CEPHALIC ANEURYSM (Left )     Patient location during evaluation: PACU Anesthesia Type: General Level of consciousness: awake and alert Pain management: pain level controlled Vital Signs Assessment: post-procedure vital signs reviewed and stable Respiratory status: spontaneous breathing, nonlabored ventilation, respiratory function stable and patient connected to nasal cannula oxygen Cardiovascular status: blood pressure returned to baseline and stable Postop Assessment: no apparent nausea or vomiting Anesthetic complications: no   No complications documented.  Last Vitals:  Vitals:   01/02/20 1115 01/02/20 1125  BP: (!) 150/93 (!) 165/99  Pulse: 74 74  Resp: 18 18  Temp:  36.6 C  SpO2: 97% 97%    Last Pain:  Vitals:   01/02/20 1125  TempSrc:   PainSc: 0-No pain                 Benedicta Sultan COKER

## 2020-01-02 NOTE — Interval H&P Note (Signed)
History and Physical Interval Note:  01/02/2020 7:16 AM  Sean Best  has presented today for surgery, with the diagnosis of ANEURYSM OF ARTERIOVENOUS FISTULA.  The various methods of treatment have been discussed with the patient and family. After consideration of risks, benefits and other options for treatment, the patient has consented to  Procedure(s): PLICATION OF LEFT RADIAL CEPHALIC ANEURYSM (Left) INSERTION OF DIALYSIS CATHETER (Left) as a surgical intervention.  The patient's history has been reviewed, patient examined, no change in status, stable for surgery.  I have reviewed the patient's chart and labs.  Questions were answered to the patient's satisfaction.     Curt Jews

## 2020-01-02 NOTE — Op Note (Signed)
    OPERATIVE REPORT  DATE OF SURGERY: 01/02/2020  PATIENT: Sean Best, 86 y.o. male MRN: [id number]  DOB: 12/12/1933  PRE-OPERATIVE DIAGNOSIS: End-stage renal disease with erosion over left forearm radiocephalic fistula  POST-OPERATIVE DIAGNOSIS:  Same  PROCEDURE: Resection of nonviable skin and plication of underlying venous aneurysm  SURGEON:  Curt Jews, M.D.  PHYSICIAN ASSISTANT: Nurse  The assistant was needed for exposure and to expedite the case  ANESTHESIA: Local with sedation  EBL: per anesthesia record  Total I/O In: 400 [I.V.:300; IV Piggyback:100] Out: 5 [Blood:5]  BLOOD ADMINISTERED: none  DRAINS: none  SPECIMEN: none  COUNTS CORRECT:  YES  PATIENT DISPOSITION:  PACU - hemodynamically stable  PROCEDURE DETAILS: Patient was taken operating placed supine position with area of the left arm prepped draped you sterile fashion.  Patient had ulceration over his fistula.  The dialysis staff was using areas away from this for hemodialysis.  An ellipse of skin was used to excise all nonviable skin over this area and the ulcerated segment.  The underlying venous aneurysm was encircled proximally and distally.  The vein was occluded proximally distally in the circumflex and the skin was excised.  The vein was very thin over this area.  The vein was plicated by removing a ellipse of vein resulting in a longitudinal incision.  The incision the vein was closed primarily with 2 layers of 5-0 Prolene suture.  Clamps removed and excellent thrill was noted.  The wounds irrigated with saline.  Hemostasis tended to cautery.  The wounds were closed with 3-0 Vicryl in the subcutaneous tissue.  Sterile dressing was applied and the patient was transferred to the recovery room in stable condition   Rosetta Posner, M.D., [facility] 01/02/2020 10:55 AM

## 2020-01-02 NOTE — Transfer of Care (Signed)
Immediate Anesthesia Transfer of Care Note  Patient: Sean Best  Procedure(s) Performed: PLICATION OF LEFT RADIAL CEPHALIC ANEURYSM (Left )  Patient Location: PACU  Anesthesia Type:MAC  Level of Consciousness: awake, patient cooperative and responds to stimulation  Airway & Oxygen Therapy: Patient Spontanous Breathing  Post-op Assessment: Report given to RN, Post -op Vital signs reviewed and stable and Patient moving all extremities X 4  Post vital signs: Reviewed and stable  Last Vitals:  Vitals Value Taken Time  BP    Temp    Pulse    Resp    SpO2      Last Pain:  Vitals:   01/02/20 0732  TempSrc:   PainSc: 0-No pain      Patients Stated Pain Goal: 3 (78/41/28 2081)  Complications: No complications documented.

## 2020-01-02 NOTE — Progress Notes (Signed)
Dr. Linna Caprice contacted regarding elevated BP, MD aware, will continue to monitor.

## 2020-01-03 ENCOUNTER — Encounter (HOSPITAL_COMMUNITY): Payer: Self-pay | Admitting: Vascular Surgery

## 2020-01-03 DIAGNOSIS — Z992 Dependence on renal dialysis: Secondary | ICD-10-CM | POA: Diagnosis not present

## 2020-01-03 DIAGNOSIS — N186 End stage renal disease: Secondary | ICD-10-CM | POA: Diagnosis not present

## 2020-01-03 DIAGNOSIS — N2581 Secondary hyperparathyroidism of renal origin: Secondary | ICD-10-CM | POA: Diagnosis not present

## 2020-01-05 DIAGNOSIS — N2581 Secondary hyperparathyroidism of renal origin: Secondary | ICD-10-CM | POA: Diagnosis not present

## 2020-01-05 DIAGNOSIS — N186 End stage renal disease: Secondary | ICD-10-CM | POA: Diagnosis not present

## 2020-01-05 DIAGNOSIS — Z992 Dependence on renal dialysis: Secondary | ICD-10-CM | POA: Diagnosis not present

## 2020-01-08 DIAGNOSIS — Z992 Dependence on renal dialysis: Secondary | ICD-10-CM | POA: Diagnosis not present

## 2020-01-08 DIAGNOSIS — N2581 Secondary hyperparathyroidism of renal origin: Secondary | ICD-10-CM | POA: Diagnosis not present

## 2020-01-08 DIAGNOSIS — N186 End stage renal disease: Secondary | ICD-10-CM | POA: Diagnosis not present

## 2020-01-09 ENCOUNTER — Other Ambulatory Visit: Payer: Self-pay

## 2020-01-09 NOTE — Patient Outreach (Signed)
Aging Gracefully Program  RN Visit  01/09/2020  Sean Best 1933/08/03 [id number]  Visit:   RN home visit #1  Start Time:   1250 End Time:   1400 Total Minutes:   70 minutes  Readiness To Change Score:     Universal RN Interventions: Calendar Distribution: Yes Medications: Yes Medication Changes: Yes Mood: Yes Pain: Yes PCP Advocacy/Support: Yes Fall Prevention: Yes Incontinence: Yes Clinician View Of Client Situation: Arrived to find patient sitting outside on the porch. walk through of home noted that patient sleeping in recliner in living room. Difficulty with shower due to problems getting into shower. Noted grab bar in the shower/ tub combo.  Daughter present  for home visit. Client View Of His/Her Situation: Patient describes to me his issues and concerms with dialysis. Reports that he thinks he needs 3 hours vs 4 hours of dialysis. Reports that he feels like too much weight is being pulled off.Reports that he often misses dialysis due to diarrhea.  Reports he feels like he is not listened too at the dialysis center. reports that he always exits the house through the front door and wishes he could get a ramp at the front.  Reports old ramp in the back of the house which was used for wife.   Daughter ,Sean Best, reports patient was boiling peanuts over the weekend and fogot about them and all the water boiled out.  Daughter reports patient is only allowed to Sean Best in [employer].  Healthcare Provider Communication:    Public affairs consultant of Client Situation: Clinician View Of Client Situation: Arrived to find patient sitting outside on the porch. walk through of home noted that patient sleeping in recliner in living room. Difficulty with shower due to problems getting into shower. Noted grab bar in the shower/ tub combo.  Daughter present  for home visit. Client's View of His/Her Situation: Client View Of His/Her Situation: Patient describes to me his issues and concerms with dialysis.  Reports that he thinks he needs 3 hours vs 4 hours of dialysis. Reports that he feels like too much weight is being pulled off.Reports that he often misses dialysis due to diarrhea.  Reports he feels like he is not listened too at the dialysis center. reports that he always exits the house through the front door and wishes he could get a ramp at the front.  Reports old ramp in the back of the house which was used for wife.   Daughter ,Sean Best, reports patient was boiling peanuts over the weekend and fogot about them and all the water boiled out.  Daughter reports patient is only allowed to Sean Best in [employer].  Medication Assessment: Do You Have Any Problems Paying For Medications?: No Where Does Client Store Medications?: Other: (in living room. Daughter fills pil box.) Can Client Read Pill Bottles?: Yes Does Client Use A Pillbox?: Yes Does Anyone Assist Client In Filling Pillbox?: Yes Does Anyone Assist Client In Taking Medications?: No Do You Take Vitamin D?: Yes Total Number Of Medications That The Client Takes: 5 Does Client Have Any Questions Or Concerns About Medictions?: No Is Client Complaining Of Any Symptoms That Could Be Side Effects To Medications?: No Any Possible Changes In Medication Regimen?: No   Outpatient Encounter Medications as of 01/09/2020  Medication Sig Note  . acetaminophen (TYLENOL) 500 MG tablet Take 1,000 mg by mouth every 6 (six) hours as needed for mild pain or headache.   Marland Kitchen amLODipine (NORVASC) 5 MG tablet Take 5 mg by  mouth daily.   Marland Kitchen aspirin 81 MG chewable tablet Chew 1 tablet (81 mg total) by mouth daily.   Marland Kitchen atorvastatin (LIPITOR) 40 MG tablet Take 40 mg by mouth every evening.    . Blood Glucose Calibration (TRUE METRIX LEVEL 2) Normal SOLN    . Blood Glucose Monitoring Suppl (TRUE METRIX METER) w/Device KIT    . calcitRIOL (ROCALTROL) 0.5 MCG capsule Take 2 capsules (1 mcg total) by mouth every Monday, Wednesday, and Friday with hemodialysis. 10/13/2019:  Given at dialysys  . calcium acetate (PHOSLO) 667 MG capsule Take 3 capsules (2,001 mg total) by mouth 3 (three) times daily with meals.   . cinacalcet (SENSIPAR) 90 MG tablet Take 90 mg by mouth every Monday, Wednesday, and Friday.    . Darbepoetin Alfa (ARANESP) 60 MCG/0.3ML SOSY injection Inject 0.3 mLs (60 mcg total) into the vein every Thursday with hemodialysis. (Patient taking differently: Inject 60 mcg into the vein every 7 (seven) days. On Fridays) 10/13/2019: Given at Dialysis  . ELDERBERRY PO Take 1 capsule by mouth daily. Gummie    . ferrous sulfate 325 (65 FE) MG EC tablet Take 325 mg by mouth daily with breakfast.    . lidocaine-prilocaine (EMLA) cream Apply 1 application topically as needed (for port access).    Marland Kitchen loperamide (IMODIUM) 2 MG capsule Take 1 capsule (2 mg total) by mouth every 6 (six) hours as needed for diarrhea or loose stools.   . Methoxy PEG-Epoetin Beta (MIRCERA IJ) Mircera   . metoprolol tartrate (LOPRESSOR) 25 MG tablet Take 0.5 tablets (12.5 mg total) by mouth 2 (two) times daily.   . nitroGLYCERIN (NITROSTAT) 0.4 MG SL tablet Place 1 tablet (0.4 mg total) under the tongue every 5 (five) minutes as needed for chest pain.   Marland Kitchen oxyCODONE-acetaminophen (PERCOCET) 5-325 MG tablet Take 1 tablet by mouth every 6 (six) hours as needed for severe pain.   . traMADol (ULTRAM) 50 MG tablet Take 50 mg by mouth daily as needed for moderate pain.    . TRUE METRIX BLOOD GLUCOSE TEST test strip SMARTSIG:Via Meter   . TRUEplus Lancets 30G MISC    . VELPHORO 500 MG chewable tablet Chew 500 mg by mouth 3 (three) times daily with meals.     No facility-administered encounter medications on file as of 01/09/2020.    RN Update: reviewed with patient his goals. Patient reports to me that his goal is have improved dialysis.  Reports he feels like he is not listened too at the clinic. Reports he wishes to have 3 hours of treatment vs 4 hours. Acknowledges that he skips dialysis when he  has diarrhea.  Reports he does not want anyone cleaning up after him.  Reviewed concern with Mahlon Gammon ( program director) and I will assist patient with communications with his case manager and nephrologist. Patient also interested in improved strength.   Session Summary:  Goals Addressed            This Visit's Progress   . Patient Stated       01/09/2020  Aging gracefully  Patient reports that he want improved experience with dialysis center in the next 90 days.  Interventions: listen to patient concerns. Reviewed with Carvel Getting ( program Director) Encouraged patient to voice his concerns at dialysis and with the rounding MD. [name], RN, BSN, CEN [place] Coordinator [phone]      . Patient Stated       Aging Gracefully:  01/09/2020  Patient states that he would like to improve his strength all over  in the next 90 days. Interventions:  encouraged patient to be as active as possible. Reviewed use of walker. Reviewed with patient at next home visit I would bring exercise plan with me and we would start exercise plan to help with strength.  [name], RN, BSN, CEN [facility] [phone]         Next home visit planned 01/30/2020   _0 .  [name], RN, BSN, CEN [facility] [employer] [phone]

## 2020-01-10 DIAGNOSIS — N186 End stage renal disease: Secondary | ICD-10-CM | POA: Diagnosis not present

## 2020-01-10 DIAGNOSIS — Z992 Dependence on renal dialysis: Secondary | ICD-10-CM | POA: Diagnosis not present

## 2020-01-10 DIAGNOSIS — N2581 Secondary hyperparathyroidism of renal origin: Secondary | ICD-10-CM | POA: Diagnosis not present

## 2020-01-11 DIAGNOSIS — T148XXA Other injury of unspecified body region, initial encounter: Secondary | ICD-10-CM | POA: Diagnosis not present

## 2020-01-12 DIAGNOSIS — N186 End stage renal disease: Secondary | ICD-10-CM | POA: Diagnosis not present

## 2020-01-12 DIAGNOSIS — Z992 Dependence on renal dialysis: Secondary | ICD-10-CM | POA: Diagnosis not present

## 2020-01-12 DIAGNOSIS — N2581 Secondary hyperparathyroidism of renal origin: Secondary | ICD-10-CM | POA: Diagnosis not present

## 2020-01-15 DIAGNOSIS — Z992 Dependence on renal dialysis: Secondary | ICD-10-CM | POA: Diagnosis not present

## 2020-01-15 DIAGNOSIS — N2581 Secondary hyperparathyroidism of renal origin: Secondary | ICD-10-CM | POA: Diagnosis not present

## 2020-01-15 DIAGNOSIS — N186 End stage renal disease: Secondary | ICD-10-CM | POA: Diagnosis not present

## 2020-01-16 DIAGNOSIS — S91301A Unspecified open wound, right foot, initial encounter: Secondary | ICD-10-CM | POA: Diagnosis not present

## 2020-01-17 DIAGNOSIS — N186 End stage renal disease: Secondary | ICD-10-CM | POA: Diagnosis not present

## 2020-01-17 DIAGNOSIS — N2581 Secondary hyperparathyroidism of renal origin: Secondary | ICD-10-CM | POA: Diagnosis not present

## 2020-01-17 DIAGNOSIS — Z992 Dependence on renal dialysis: Secondary | ICD-10-CM | POA: Diagnosis not present

## 2020-01-19 DIAGNOSIS — Z992 Dependence on renal dialysis: Secondary | ICD-10-CM | POA: Diagnosis not present

## 2020-01-19 DIAGNOSIS — N186 End stage renal disease: Secondary | ICD-10-CM | POA: Diagnosis not present

## 2020-01-19 DIAGNOSIS — N2581 Secondary hyperparathyroidism of renal origin: Secondary | ICD-10-CM | POA: Diagnosis not present

## 2020-01-21 DIAGNOSIS — N2581 Secondary hyperparathyroidism of renal origin: Secondary | ICD-10-CM | POA: Diagnosis not present

## 2020-01-21 DIAGNOSIS — Z992 Dependence on renal dialysis: Secondary | ICD-10-CM | POA: Diagnosis not present

## 2020-01-21 DIAGNOSIS — N186 End stage renal disease: Secondary | ICD-10-CM | POA: Diagnosis not present

## 2020-01-23 ENCOUNTER — Emergency Department (HOSPITAL_COMMUNITY)
Admission: EM | Admit: 2020-01-23 | Discharge: 2020-01-24 | Disposition: A | Discharge status: Home / Self Care | Payer: Medicare HMO | Attending: Emergency Medicine | Admitting: Emergency Medicine

## 2020-01-23 ENCOUNTER — Encounter (HOSPITAL_COMMUNITY): Payer: Self-pay | Admitting: Emergency Medicine

## 2020-01-23 ENCOUNTER — Other Ambulatory Visit: Payer: Self-pay

## 2020-01-23 ENCOUNTER — Emergency Department (HOSPITAL_COMMUNITY): Payer: Medicare HMO

## 2020-01-23 DIAGNOSIS — Z7982 Long term (current) use of aspirin: Secondary | ICD-10-CM | POA: Insufficient documentation

## 2020-01-23 DIAGNOSIS — Z8546 Personal history of malignant neoplasm of prostate: Secondary | ICD-10-CM | POA: Insufficient documentation

## 2020-01-23 DIAGNOSIS — R609 Edema, unspecified: Secondary | ICD-10-CM | POA: Diagnosis not present

## 2020-01-23 DIAGNOSIS — E1129 Type 2 diabetes mellitus with other diabetic kidney complication: Secondary | ICD-10-CM | POA: Insufficient documentation

## 2020-01-23 DIAGNOSIS — I132 Hypertensive heart and chronic kidney disease with heart failure and with stage 5 chronic kidney disease, or end stage renal disease: Secondary | ICD-10-CM | POA: Diagnosis not present

## 2020-01-23 DIAGNOSIS — Z743 Need for continuous supervision: Secondary | ICD-10-CM | POA: Diagnosis not present

## 2020-01-23 DIAGNOSIS — Z950 Presence of cardiac pacemaker: Secondary | ICD-10-CM | POA: Insufficient documentation

## 2020-01-23 DIAGNOSIS — S91331A Puncture wound without foreign body, right foot, initial encounter: Secondary | ICD-10-CM | POA: Insufficient documentation

## 2020-01-23 DIAGNOSIS — I509 Heart failure, unspecified: Secondary | ICD-10-CM | POA: Diagnosis not present

## 2020-01-23 DIAGNOSIS — Z79899 Other long term (current) drug therapy: Secondary | ICD-10-CM | POA: Diagnosis not present

## 2020-01-23 DIAGNOSIS — N186 End stage renal disease: Secondary | ICD-10-CM | POA: Diagnosis not present

## 2020-01-23 DIAGNOSIS — Z992 Dependence on renal dialysis: Secondary | ICD-10-CM | POA: Insufficient documentation

## 2020-01-23 DIAGNOSIS — S99921A Unspecified injury of right foot, initial encounter: Secondary | ICD-10-CM | POA: Diagnosis present

## 2020-01-23 DIAGNOSIS — N2581 Secondary hyperparathyroidism of renal origin: Secondary | ICD-10-CM | POA: Diagnosis not present

## 2020-01-23 DIAGNOSIS — X58XXXA Exposure to other specified factors, initial encounter: Secondary | ICD-10-CM | POA: Diagnosis not present

## 2020-01-23 DIAGNOSIS — Z955 Presence of coronary angioplasty implant and graft: Secondary | ICD-10-CM | POA: Insufficient documentation

## 2020-01-23 DIAGNOSIS — R5381 Other malaise: Secondary | ICD-10-CM | POA: Diagnosis not present

## 2020-01-23 DIAGNOSIS — M7989 Other specified soft tissue disorders: Secondary | ICD-10-CM | POA: Diagnosis not present

## 2020-01-23 DIAGNOSIS — R6889 Other general symptoms and signs: Secondary | ICD-10-CM | POA: Diagnosis not present

## 2020-01-23 NOTE — ED Triage Notes (Signed)
Patient arrived with EMS from home reports worsening right foot infection with swelling for 2 weeks , currently taking oral antibiotic with no improvement .

## 2020-01-24 DIAGNOSIS — M7989 Other specified soft tissue disorders: Secondary | ICD-10-CM | POA: Diagnosis not present

## 2020-01-24 LAB — CBC WITH DIFFERENTIAL/PLATELET
Abs Immature Granulocytes: 0.02 10*3/uL (ref 0.00–0.07)
Basophils Absolute: 0.1 10*3/uL (ref 0.0–0.1)
Basophils Relative: 1 %
Eosinophils Absolute: 0.4 10*3/uL (ref 0.0–0.5)
Eosinophils Relative: 7 %
HCT: 38.2 % — ABNORMAL LOW (ref 39.0–52.0)
Hemoglobin: 11 g/dL — ABNORMAL LOW (ref 13.0–17.0)
Immature Granulocytes: 0 %
Lymphocytes Relative: 16 %
Lymphs Abs: 0.9 10*3/uL (ref 0.7–4.0)
MCH: 25.6 pg — ABNORMAL LOW (ref 26.0–34.0)
MCHC: 28.8 g/dL — ABNORMAL LOW (ref 30.0–36.0)
MCV: 89 fL (ref 80.0–100.0)
Monocytes Absolute: 0.7 10*3/uL (ref 0.1–1.0)
Monocytes Relative: 13 %
Neutro Abs: 3.4 10*3/uL (ref 1.7–7.7)
Neutrophils Relative %: 63 %
Platelets: 228 10*3/uL (ref 150–400)
RBC: 4.29 MIL/uL (ref 4.22–5.81)
RDW: 21.2 % — ABNORMAL HIGH (ref 11.5–15.5)
WBC: 5.4 10*3/uL (ref 4.0–10.5)
nRBC: 0 % (ref 0.0–0.2)

## 2020-01-24 LAB — BASIC METABOLIC PANEL
Anion gap: 15 (ref 5–15)
BUN: 11 mg/dL (ref 8–23)
CO2: 31 mmol/L (ref 22–32)
Calcium: 9.2 mg/dL (ref 8.9–10.3)
Chloride: 91 mmol/L — ABNORMAL LOW (ref 98–111)
Creatinine, Ser: 3.84 mg/dL — ABNORMAL HIGH (ref 0.61–1.24)
GFR, Estimated: 15 mL/min — ABNORMAL LOW (ref >60)
Glucose, Bld: 97 mg/dL (ref 70–99)
Potassium: 3.4 mmol/L — ABNORMAL LOW (ref 3.5–5.1)
Sodium: 137 mmol/L (ref 135–145)

## 2020-01-24 MED ORDER — ACETAMINOPHEN 500 MG PO TABS
1000.0000 mg | ORAL_TABLET | Freq: Once | ORAL | Status: AC
  Administered 2020-01-24: 1000 mg via ORAL
  Filled 2020-01-24: qty 2

## 2020-01-24 MED ORDER — HIBICLENS 4 % EX LIQD: Freq: Every day | CUTANEOUS | 0 refills | Status: AC | PRN

## 2020-01-24 NOTE — ED Provider Notes (Signed)
[facility] EMERGENCY DEPARTMENT Provider Note   CSN: [id number] Arrival date & time: 01/23/20  2324     History Chief Complaint  Patient presents with  . Foot Swelling/Infection    Sean Best is a 86 y.o. male.  Patient presents to the emergency department with a chief complaint of right foot infection.  He states that he "got a hold of a bad pair shoes," and developed infection in his foot.  He has been on antibiotic prescribed by his PCP.  He has been taking it as directed.  He states that his daughter was cleaning his foot earlier today, and thought that it looked worse, and sent him to the emergency department for evaluation.  He denies any fevers or chills.  He is diabetic, denies significant pain in his foot.  The history is provided by the patient. No language interpreter was used.       Past Medical History:  Diagnosis Date  . Anemia   . Arthritis    "knees" (04/11/2016)  . Blind left eye    S/P trauma  . CHF (congestive heart failure) ([place])   . Chronic total occlusion of artery of the extremities (HCC)    pt not aware of this  . Claudication ([place])   . ESRD on dialysis [facility])    "MWF; [name]" ((04/10/2016)  . ESRD on peritoneal dialysis [facility])    Started dialysis around April 2015 per son.  Has been doing peritoneal dialysis at home.     . Gout   . Hyperlipidemia   . Hypertension   . Overweight(278.02)   . Presence of permanent cardiac pacemaker    medtronic  . Prostate cancer ([place]) 1990s  . Shingles   . Stroke [facility]) ~1998   denies residual on 04/11/2016  . Type II diabetes mellitus (HCC)    diet controlled  . Umbilical hernia 09/07/6436    Patient Active Problem List   Diagnosis Date Noted  . End-stage renal disease on hemodialysis ([place]) 09/20/2019  . HLD (hyperlipidemia) 09/20/2019  . Prostate cancer ([place]) 09/20/2019  . Umbilical hernia 38/18/4037  . Acute pancreatitis 09/20/2019  . History of noncompliance with medical  treatment 09/20/2019  . Diabetes mellitus type 2, uncontrolled, with complications ([place]) 54/36/0677  . Acute GI bleeding 09/20/2019  . Shoulder pain, bilateral 11/22/2018  . Impingement syndrome of right shoulder 11/08/2018  . Hypertensive emergency 10/16/2018  . Hypertensive encephalopathy 10/15/2018  . Left bundle branch block (LBBB) determined by electrocardiography 02/13/2018  . Aortic stenosis 02/13/2018  . NSTEMI (non-ST elevated myocardial infarction) ([place]) 02/13/2018  . Constipation 12/08/2017  . Abdominal pain 12/06/2017  . Leukocytosis 12/06/2017  . Gout 12/06/2017  . Chronic pain 12/06/2017  . Acute on chronic cholecystitis s/p lap cholecystectomy 12/02/2017 12/02/2017  . Cardiac pacemaker in situ (Medtronic DDD) 12/02/2017  . Acute hypoxemic respiratory failure ([place]) 04/18/2017  . Heart block 04/17/2017  . Elevated troponin 04/17/2017  . ESRD on hemodialysis ([place]) 04/12/2016  . H/O: stroke 04/11/2016  . Saccular aneurysm of infrarenal aorta with thrombosed dissection of abdominal aorta 04/11/2016  . Obesity (BMI 30-39.9) 04/11/2016  . Syncope 04/11/2016  . Prostate CA ([place]) 12/28/2014  . Chest pain 12/28/2014  . C. difficile colitis 10/04/2014  . Hyperkalemia 03/11/2013  . Anemia 03/11/2013  . DM (diabetes mellitus), type 2 with renal complications, diet controlled 03/11/2013  . HTN (hypertension) 03/11/2013  . Hyperlipidemia 03/11/2013  . ESRD on dialysis [facility]) 03/20/2011    Past Surgical History:  Procedure Laterality Date  . A/V FISTULAGRAM N/A 11/01/2018   Procedure: A/V FISTULAGRAM;  Surgeon: Serafina Mitchell, MD;  Location: [facility];  Service: Cardiovascular;  Laterality: N/A;  . AV FISTULA PLACEMENT, RADIOCEPHALIC  13/09/6576   Left arm  . CAPD INSERTION N/A 03/06/2013   Procedure: LAPAROSCOPIC INSERTION CONTINUOUS AMBULATORY PERITONEAL DIALYSIS  (CAPD) CATHETER;  Surgeon: Edward Jolly, MD;  Location: [place] [facility];  Service: General;  Laterality:  N/A;  . CATARACT EXTRACTION Right   . CHOLECYSTECTOMY  12/02/2017   LAPROSCOPIC  . CORONARY STENT INTERVENTION N/A 02/11/2018   Procedure: CORONARY STENT INTERVENTION;  Surgeon: Sherren Mocha, MD;  Location: [place] [facility];  Service: Cardiovascular;  Laterality: N/A;  . ESOPHAGOGASTRODUODENOSCOPY (EGD) WITH PROPOFOL N/A 10/18/2019   Procedure: ESOPHAGOGASTRODUODENOSCOPY (EGD) WITH PROPOFOL;  Surgeon: Arta Silence, MD;  Location: [facility];  Service: Endoscopy;  Laterality: N/A;  . EUS N/A 10/18/2019   Procedure: FULL UPPER ENDOSCOPIC ULTRASOUND (EUS) RADIAL;  Surgeon: Arta Silence, MD;  Location: [facility];  Service: Endoscopy;  Laterality: N/A;  . EYE SURGERY Left 1970s   for eye injury  . FISTULA SUPERFICIALIZATION Left 46/10/6293   Procedure: PLICATION OF LEFT RADIAL CEPHALIC ANEURYSM;  Surgeon: Rosetta Posner, MD;  Location: [place];  Service: Vascular;  Laterality: Left;  . HERNIA REPAIR    . INSERTION PROSTATE RADIATION SEED    . LAPAROSCOPIC CHOLECYSTECTOMY SINGLE SITE WITH INTRAOPERATIVE CHOLANGIOGRAM N/A 12/02/2017   Procedure: LAPAROSCOPIC CHOLECYSTECTOMY;  Surgeon: Michael Boston, MD;  Location: [place];  Service: General;  Laterality: N/A;  . LEFT HEART CATH AND CORONARY ANGIOGRAPHY N/A 02/11/2018   Procedure: LEFT HEART CATH AND CORONARY ANGIOGRAPHY;  Surgeon: Sherren Mocha, MD;  Location: [place] [facility];  Service: Cardiovascular;  Laterality: N/A;  . PACEMAKER IMPLANT N/A 04/20/2017   Procedure: PACEMAKER IMPLANT;  Surgeon: Evans Lance, MD;  Location: [place] [facility];  Service: Cardiovascular;  Laterality: N/A;  . PERIPHERAL VASCULAR BALLOON ANGIOPLASTY  11/01/2018   Procedure: PERIPHERAL VASCULAR BALLOON ANGIOPLASTY;  Surgeon: Serafina Mitchell, MD;  Location: [place] [facility];  Service: Cardiovascular;;  Lt lower arm fistula   . UMBILICAL HERNIA REPAIR N/A 03/06/2013   Procedure: HERNIA REPAIR UMBILICAL WITH MESH;  Surgeon: Edward Jolly, MD;  [facility] OR;  Service: General;  Laterality: N/A;       Family History  Problem Relation Age of Onset  . Cancer Mother   . Heart disease Mother   . Cancer Father     Social History   Tobacco Use  . Smoking status: Never Smoker  . Smokeless tobacco: Never Used  Vaping Use  . Vaping Use: Never used  Substance Use Topics  . Alcohol use: No  . Drug use: No    Home Medications Prior to Admission medications   Medication Sig Start Date End Date Taking? Authorizing Provider  acetaminophen (TYLENOL) 500 MG tablet Take 1,000 mg by mouth every 6 (six) hours as needed for mild pain or headache.    [provider]  amLODipine (NORVASC) 5 MG tablet Take 5 mg by mouth daily. 08/18/19   [provider]  aspirin 81 MG chewable tablet Chew 1 tablet (81 mg total) by mouth daily. 02/14/18   Desiree Hane, MD  atorvastatin (LIPITOR) 40 MG tablet Take 40 mg by mouth every evening.  07/07/17   [provider]  Blood Glucose Calibration (TRUE METRIX LEVEL 2) Normal SOLN  09/14/19   [provider]  Blood Glucose Monitoring Suppl (TRUE METRIX METER) w/Device KIT  09/14/19   [provider]  calcitRIOL (ROCALTROL) 0.5 MCG capsule Take 2 capsules (1 mcg total) by mouth every Monday, Wednesday, and Friday with hemodialysis. 09/27/19   Allie Bossier, MD  calcium acetate (PHOSLO) 667 MG capsule Take 3 capsules (2,001 mg total) by mouth 3 (three) times daily with meals. 04/30/18   Nita Sells, MD  cinacalcet (SENSIPAR) 90 MG tablet Take 90 mg by mouth every Monday, Wednesday, and Friday.     [provider]  Darbepoetin Alfa (ARANESP) 60 MCG/0.3ML SOSY injection Inject 0.3 mLs (60 mcg total) into the vein every Thursday with hemodialysis. Patient taking differently: Inject 60 mcg into the vein every 7 (seven) days. On Fridays 09/28/19   Allie Bossier, MD  ELDERBERRY PO Take 1 capsule by mouth daily. Gummie     [provider]  ferrous sulfate  325 (65 FE) MG EC tablet Take 325 mg by mouth daily with breakfast.     [provider]  lidocaine-prilocaine (EMLA) cream Apply 1 application topically as needed (for port access).  10/28/17   [provider]  loperamide (IMODIUM) 2 MG capsule Take 1 capsule (2 mg total) by mouth every 6 (six) hours as needed for diarrhea or loose stools. 10/18/18   Arrien, Jimmy Picket, MD  Methoxy PEG-Epoetin Beta (MIRCERA IJ) Mircera 10/11/19 10/09/20  [provider]  metoprolol tartrate (LOPRESSOR) 25 MG tablet Take 0.5 tablets (12.5 mg total) by mouth 2 (two) times daily. 09/26/19   Allie Bossier, MD  nitroGLYCERIN (NITROSTAT) 0.4 MG SL tablet Place 1 tablet (0.4 mg total) under the tongue every 5 (five) minutes as needed for chest pain. 04/18/19   Leonie Man, MD  oxyCODONE-acetaminophen (PERCOCET) 5-325 MG tablet Take 1 tablet by mouth every 6 (six) hours as needed for severe pain. 01/02/20   Rosetta Posner, MD  traMADol (ULTRAM) 50 MG tablet Take 50 mg by mouth daily as needed for moderate pain.  08/01/19   [provider]  TRUE METRIX BLOOD GLUCOSE TEST test strip SMARTSIG:Via Meter 09/14/19   [provider]  TRUEplus Lancets 30G MISC  09/14/19   [provider]  VELPHORO 500 MG chewable tablet Chew 500 mg by mouth 3 (three) times daily with meals.  01/28/18   [provider]    Allergies    Cardura [doxazosin mesylate] and Tape  Review of Systems   Review of Systems  All other systems reviewed and are negative.   Physical Exam Updated Vital Signs BP (!) 167/101 (BP Location: Right Arm)   Pulse 77   Temp 98.3 F (36.8 C) (Oral)   Resp 16   Ht '5\' 5"'  (1.651 m)   Wt 95 kg   SpO2 95%   BMI 34.85 kg/m   Physical Exam Vitals and nursing note reviewed.  Constitutional:      Appearance: He is well-developed.  HENT:     Head: Normocephalic and atraumatic.  Eyes:     Conjunctiva/sclera: Conjunctivae normal.  Cardiovascular:      Rate and Rhythm: Normal rate and regular rhythm.     Heart sounds: No murmur heard.   Pulmonary:     Effort: Pulmonary effort is normal. No respiratory distress.     Breath sounds: Normal breath sounds.  Abdominal:     Palpations: Abdomen is soft.     Tenderness: There is no abdominal tenderness.  Musculoskeletal:  General: Normal range of motion.     Cervical back: Neck supple.  Skin:    General: Skin is warm and dry.     Comments: No significant erythema or abscess  Neurological:     Mental Status: He is alert and oriented to person, place, and time.  Psychiatric:        Mood and Affect: Mood normal.        Behavior: Behavior normal.     ED Results / Procedures / Treatments   Labs (all labs ordered are listed, but only abnormal results are displayed) Labs Reviewed  CBC WITH DIFFERENTIAL/PLATELET  BASIC METABOLIC PANEL    EKG None  Radiology DG Foot Complete Right  Result Date: 01/24/2020 CLINICAL DATA:  Right foot swelling EXAM: RIGHT FOOT COMPLETE - 3+ VIEW COMPARISON:  None. FINDINGS: There is no evidence of fracture or dislocation. There is no evidence of arthropathy or other focal bone abnormality. Soft tissues are unremarkable. IMPRESSION: Negative. Electronically Signed   By: Ulyses Jarred M.D.   On: 01/24/2020 00:19    Procedures Procedures (including critical care time)  Medications Ordered in ED Medications - No data to display  ED Course  I have reviewed the triage vital signs and the nursing notes.  Pertinent labs & imaging results that were available during my care of the patient were reviewed by me and considered in my medical decision making (see chart for details).    MDM Rules/Calculators/A&P                          Patient here for foot wound.  He has been having his foot treated with antibiotics for the past week.  He has been compliant.  His daughter thought that the wound looked worse.  It is not very impressive on my exam.  I see  no evidence of abscess or significant cellulitis requiring further work-up or admission.  Feel that patient can be safely discharged home.  Laboratory work-up is reassuring.  Patient seen by and discussed with Dr. Leonette Monarch. Final Clinical Impression(s) / ED Diagnoses Final diagnoses:  Penetrating wound of right foot, initial encounter    Rx / DC Orders ED Discharge Orders    None       Montine Circle, PA-C 01/24/20 [place], MD 01/24/20 [phone]

## 2020-01-24 NOTE — ED Provider Notes (Signed)
Attestation: Medical screening examination/treatment/procedure(s) were conducted as a shared visit with non-physician practitioner(s) and myself.  I personally evaluated the patient during the encounter.   Briefly, the patient is a 86 y.o. male with h/o DM, here for right foot infextion.   Vitals:   01/23/20 2327 01/24/20 0400  BP: (!) 167/101 (!) 171/102  Pulse: 77 78  Resp: 16 18  Temp: 98.3 F (36.8 C)   SpO2: 95% 94%    CONSTITUTIONAL:  well-appearing, NAD NEURO:  Alert and oriented x 3, no focal deficits EYES:  pupils equal and reactive ENT/NECK:  trachea midline, no JVD CARDIO:  reg rate, reg rhythm, well-perfused PULM:  None labored breathing GI/GU:  Abdomen non-distended MSK/SPINE:  No gross deformities, no edema SKIN:  Ruptured blisters to toes. No discharge. Chronic skin discolorization bilaterally. PSYCH:  Appropriate speech and behavior   EKG Interpretation  Date/Time:    Ventricular Rate:    PR Interval:    QRS Duration:   QT Interval:    QTC Calculation:   R Axis:     Text Interpretation:         Work up reassuring. Continue Abx. Chlorhexidine soaks. PCP follow up.     Fatima Blank, MD 01/24/20 [phone]

## 2020-01-24 NOTE — Discharge Instructions (Addendum)
Keep your foot clean and dry.  Return for new or worsening symptoms.

## 2020-01-26 DIAGNOSIS — N186 End stage renal disease: Secondary | ICD-10-CM | POA: Diagnosis not present

## 2020-01-26 DIAGNOSIS — Z992 Dependence on renal dialysis: Secondary | ICD-10-CM | POA: Diagnosis not present

## 2020-01-26 DIAGNOSIS — N2581 Secondary hyperparathyroidism of renal origin: Secondary | ICD-10-CM | POA: Diagnosis not present

## 2020-01-28 DIAGNOSIS — Z950 Presence of cardiac pacemaker: Secondary | ICD-10-CM | POA: Diagnosis not present

## 2020-01-28 DIAGNOSIS — E1122 Type 2 diabetes mellitus with diabetic chronic kidney disease: Secondary | ICD-10-CM | POA: Diagnosis not present

## 2020-01-28 DIAGNOSIS — J9601 Acute respiratory failure with hypoxia: Secondary | ICD-10-CM | POA: Diagnosis not present

## 2020-01-28 DIAGNOSIS — M109 Gout, unspecified: Secondary | ICD-10-CM | POA: Diagnosis not present

## 2020-01-30 ENCOUNTER — Ambulatory Visit (INDEPENDENT_AMBULATORY_CARE_PROVIDER_SITE_OTHER): Payer: Self-pay | Admitting: Physician Assistant

## 2020-01-30 ENCOUNTER — Other Ambulatory Visit: Payer: Self-pay

## 2020-01-30 VITALS — BP 132/79 | HR 72 | Temp 97.6°F | Resp 20 | Ht 65.0 in | Wt 175.6 lb

## 2020-01-30 DIAGNOSIS — E1129 Type 2 diabetes mellitus with other diabetic kidney complication: Secondary | ICD-10-CM | POA: Diagnosis not present

## 2020-01-30 DIAGNOSIS — Z992 Dependence on renal dialysis: Secondary | ICD-10-CM

## 2020-01-30 DIAGNOSIS — L97519 Non-pressure chronic ulcer of other part of right foot with unspecified severity: Secondary | ICD-10-CM

## 2020-01-30 DIAGNOSIS — N186 End stage renal disease: Secondary | ICD-10-CM | POA: Diagnosis not present

## 2020-01-30 MED ORDER — TRAMADOL HCL 50 MG PO TABS: 50.0000 mg | ORAL_TABLET | Freq: Two times a day (BID) | ORAL | 0 refills | Status: AC | PRN

## 2020-01-30 NOTE — Progress Notes (Signed)
Established Dialysis Access   History of Present Illness   Sean Best is a 86 y.o. (1933/04/24) male who presents for re-evaluation of permanent access.  He is status post revision of left radiocephalic fistula by plication by Dr. Donnetta Hutching 01/02/2020.  He states incision is well-healed and he is dialyzing via left radiocephalic fistula without problems on a Monday Wednesday Friday schedule at the [facility] location in [place].    He is accompanied by his daughter today who states she has been managing blistering wounds of his right foot including all 5 toes.  She states this came out of nowhere 3 weeks ago.  They have now been on 2 rounds of doxycycline.  He also went to the emergency department last week before Thanksgiving and x-ray was negative for osteomyelitis.  She has been cleansing his foot and toes with Hibiclens daily.  He denies pain to the foot and toes.  He is a diabetic however this is diet-controlled.  Hemoglobin A1c in July of this year was 4.2.  The daughter believes the wounds are improving some.  Current Outpatient Medications  Medication Sig Dispense Refill  . acetaminophen (TYLENOL) 500 MG tablet Take 1,000 mg by mouth every 6 (six) hours as needed for mild pain or headache.    Marland Kitchen amLODipine (NORVASC) 5 MG tablet Take 5 mg by mouth daily.    Marland Kitchen aspirin 81 MG chewable tablet Chew 1 tablet (81 mg total) by mouth daily. 60 tablet 2  . atorvastatin (LIPITOR) 40 MG tablet Take 40 mg by mouth every evening.   3  . Blood Glucose Calibration (TRUE METRIX LEVEL 2) Normal SOLN     . Blood Glucose Monitoring Suppl (TRUE METRIX METER) w/Device KIT     . calcitRIOL (ROCALTROL) 0.5 MCG capsule Take 2 capsules (1 mcg total) by mouth every Monday, Wednesday, and Friday with hemodialysis. 12 capsule 0  . calcium acetate (PHOSLO) 667 MG capsule Take 3 capsules (2,001 mg total) by mouth 3 (three) times daily with meals. 90 capsule 0  . chlorhexidine (HIBICLENS) 4 % external  liquid Apply topically daily as needed. Put 2 tablespoons in a basin of warm water and soak your foot for the next 7 days 120 mL 0  . cinacalcet (SENSIPAR) 90 MG tablet Take 90 mg by mouth every Monday, Wednesday, and Friday.     . cyclobenzaprine (FLEXERIL) 10 MG tablet     . Darbepoetin Alfa (ARANESP) 60 MCG/0.3ML SOSY injection Inject 0.3 mLs (60 mcg total) into the vein every Thursday with hemodialysis. (Patient taking differently: Inject 60 mcg into the vein every 7 (seven) days. On Fridays) 4.2 mL 0  . ELDERBERRY PO Take 1 capsule by mouth daily. Gummie     . ferrous sulfate 325 (65 FE) MG EC tablet Take 325 mg by mouth daily with breakfast.     . lidocaine-prilocaine (EMLA) cream Apply 1 application topically as needed (for port access).   11  . loperamide (IMODIUM) 2 MG capsule Take 1 capsule (2 mg total) by mouth every 6 (six) hours as needed for diarrhea or loose stools. 20 capsule 0  . Methoxy PEG-Epoetin Beta (MIRCERA IJ) Mircera    . metoprolol tartrate (LOPRESSOR) 25 MG tablet Take 0.5 tablets (12.5 mg total) by mouth 2 (two) times daily. 60 tablet 0  . nitroGLYCERIN (NITROSTAT) 0.4 MG SL tablet Place 1 tablet (0.4 mg total) under the tongue every 5 (five) minutes as needed for chest pain. 25 tablet 4  .  oxyCODONE-acetaminophen (PERCOCET) 5-325 MG tablet Take 1 tablet by mouth every 6 (six) hours as needed for severe pain. 8 tablet 0  . traMADol (ULTRAM) 50 MG tablet Take 1 tablet (50 mg total) by mouth 2 (two) times daily as needed for moderate pain. 30 tablet 0  . TRUE METRIX BLOOD GLUCOSE TEST test strip SMARTSIG:Via Meter    . TRUEplus Lancets 30G MISC     . VELPHORO 500 MG chewable tablet Chew 500 mg by mouth 3 (three) times daily with meals.   3  . VITAMIN D PO Take by mouth.     No current facility-administered medications for this visit.    REVIEW OF SYSTEMS (negative unless checked):   Cardiac:  _0  Chest pain or chest pressure? _1  Shortness of breath upon activity? _2   Shortness of breath when lying flat? _3  Irregular heart rhythm?  Vascular:  _4  Pain in calf, thigh, or hip brought on by walking? _5  Pain in feet at night that wakes you up from your sleep? _6  Blood clot in your veins? _7  Leg swelling?  Pulmonary:  _8  Oxygen at home? _9  Productive cough? _10  Wheezing?  Neurologic:  _11  Sudden weakness in arms or legs? _12  Sudden numbness in arms or legs? _13  Sudden onset of difficult speaking or slurred speech? _14  Temporary loss of vision in one eye? _15  Problems with dizziness?  Gastrointestinal:  _16  Blood in stool? _17  Vomited blood?  Genitourinary:  _18  Burning when urinating? _19  Blood in urine?  Psychiatric:  _20  Major depression  Hematologic:  _21  Bleeding problems? _22  Problems with blood clotting?  Dermatologic:  _23  Rashes or ulcers?  Constitutional:  _24  Fever or chills?  Ear/Nose/Throat:  _25  Change in hearing? _26  Nose bleeds? _27  Sore throat?  Musculoskeletal:  _28  Back pain? _29  Joint pain? _30  Muscle pain?   Physical Examination   Vitals:   01/30/20 0855  BP: 132/79  Pulse: 72  Resp: 20  Temp: 97.6 F (36.4 C)  TempSrc: Temporal  SpO2: 95%  Weight: 175 lb 9.6 oz (79.7 kg)  Height: _31  (1.651 m)   Body mass index is 29.22 kg/m.  General:  WDWN in NAD; vital signs documented above Gait: Not observed HENT: WNL, normocephalic Pulmonary: normal non-labored breathing Cardiac: regular HR Abdomen: soft, NT, no masses Skin: without rashes Vascular Exam/Pulses:  Right Left  Radial 2+ (normal) 2+ (normal)  DP absent absent  PT absent absent   Extremities:  Musculoskeletal: no muscle wasting or atrophy  Neurologic: A&O X 3;  No focal weakness or paresthesias are detected Psychiatric:  The pt has Normal affect.    Medical Decision Making   Nazire Fruth is a 86 y.o. male who presents with ESRD requiring hemodialysis.  Also being evaluated for R foot wounds.   Incision of the left radiocephalic fistula  plication has completely healed; fistula is patent with a palpable thrill throughout  [facility] to use plicated portion of fistula for dialysis   Patient also presents today with 3-week history of right foot and toe wounds; no prior noninvasive studies to evaluate PAD  Based on picture on daughter's phone from several weeks ago patient's wounds seem to be improving however he is at risk for limb loss if wounds were to become infected  I have offered the patient right lower extremity arteriogram with possible intervention; at this time the patient does not consent to a procedure and would like to wait and see if there is further improvement.  I will notify the patient's  kidney center to start 1 g of vancomycin during dialysis treatments for a duration of 2 weeks.  We will recheck wounds of right foot in office in 2 weeks.  Patient's daughter will continue daily cleansing with antibacterial soap and water as well as Betadine paint.  If the daughter notices worsening of the wounds prior to this 2-week recheck, she will call to schedule angiography.  I have also refilled tramadol prescription initially prescribed by the patient's PCP Dr. Delfina Redwood.  Patient and daughter agree with the above management plan   Dagoberto Ligas PA-C [facility] Office: [phone]  Clinic MD: Carlis Abbott

## 2020-01-30 NOTE — Patient Outreach (Signed)
Aging Gracefully Program  RN Visit  01/30/2020  Rhone Ozaki [birth year] [id number]  Visit:   RN home visit #2  Start Time:   1130 End Time:   1215 Total Minutes:   81  Readiness To Change Score:     Universal RN Interventions: Calendar Distribution: Yes Medications: Yes Medication Changes: Yes Mood: Yes Pain: Yes PCP Advocacy/Support: Yes Fall Prevention: Yes Incontinence: Yes Clinician View Of Client Situation: Arrived to find patient sitting in his recliner. Noted to have the right foot wrapped and a heel protector on the right foot. Daughter present for home visit.  Reviewed pictures of right foot provided by daughter. Patient in good spirits. Client View Of His/Her Situation: Daughter/patient report to me that patient wore shoes to dialyis that were too tight. Reports shoes rubbed a sore on the right foot. All toes . Daughter was informed by patient on 01/16/2020 and sores had been present for 1 week prior to daughters knowledge. Patinet was seen today  by vascular and given direction about foot. Patient has completed 2 rounds of doxycyline. RX for tramadol today.  Daughter has been talking with wound nurse about treatment. daughter has been using betaidine and triple antibiotic ointment. Follow up with PCP this week.  Patient  reports missing dialysis yesterday due to pain.  Healthcare Provider Communication: Did Higher education careers adviser With [employer] Provider?: No  Clinician View of Client Situation: Clinician View Of Client Situation: Arrived to find patient sitting in his recliner. Noted to have the right foot wrapped and a heel protector on the right foot. Daughter present for home visit.  Reviewed pictures of right foot provided by daughter. Patient in good spirits. Client's View of His/Her Situation: Client View Of His/Her Situation: Daughter/patient report to me that patient wore shoes to dialyis that were too tight. Reports shoes rubbed a sore on the right foot. All toes .  Daughter was informed by patient on 01/16/2020 and sores had been present for 1 week prior to daughters knowledge. Patinet was seen today  by vascular and given direction about foot. Patient has completed 2 rounds of doxycyline. RX for tramadol today.  Daughter has been talking with wound nurse about treatment. daughter has been using betaidine and triple antibiotic ointment. Follow up with PCP this week.  Patient  reports missing dialysis yesterday due to pain.  Medication Assessment: medication list reviewed with daughter and patient.     OT Update: New wound to foot due to tight fitting shoes.   Session Summary: Reviewed plan of care with foot.  Goals Addressed            This Visit's Progress   . Patient Stated       01/09/2020  Aging gracefully  Patient reports that he want improved experience with dialysis center in the next 90 days.  Interventions: listen to patient concerns. Reviewed with Carvel Getting ( program Director) Encouraged patient to voice his concerns at dialysis and with the rounding MD. [name], RN, BSN, CEN [place] Coordinator [phone]   01/30/2020 Reviewed with patient and daughter the importance of talking with dialysis case manager. Patient and daughter interested in assistance with transportation to the dialysis center. Review how to contact case manager. Teaching patient and daughter how to advocate for himself.   [name], RN, BSN, CEN [facility] [employer] [phone]      . Patient Stated       Aging Gracefully:  01/09/2020  Patient states that he would like  to improve his strength all over  in the next 90 days. Interventions:  encouraged patient to be as active as possible. Reviewed use of walker. Reviewed with patient at next home visit I would bring exercise plan with me and we would start exercise plan to help with strength.  [name], RN, BSN, CEN [facility] [phone]    01/30/2020   Reviewed new problems with broken skin to the right foot and treatment plan. Reviewed and demonstrated appropriate AG exercises. Provided printed booklet.  Crossed out exercises not appropriate for patient at this time.  Encouraged patient to complete his exercises daily. He voices understanding and was about to demonstrate chair exercises.  [name], RN, BSN, CEN [facility] [phone]        Email update about wound sent to Guadelupe Sabin ( assigned OT)  Home assessment not yet completed.  Next RN home visit planned for 02/27/2020 at 11:30 PCP visit pending.  Wound center appointment pending.   [name], RN, BSN, CEN [facility] [employer] [phone]

## 2020-01-31 DIAGNOSIS — N186 End stage renal disease: Secondary | ICD-10-CM | POA: Diagnosis not present

## 2020-01-31 DIAGNOSIS — N2581 Secondary hyperparathyroidism of renal origin: Secondary | ICD-10-CM | POA: Diagnosis not present

## 2020-01-31 DIAGNOSIS — Z992 Dependence on renal dialysis: Secondary | ICD-10-CM | POA: Diagnosis not present

## 2020-02-01 DIAGNOSIS — D631 Anemia in chronic kidney disease: Secondary | ICD-10-CM | POA: Diagnosis not present

## 2020-02-01 DIAGNOSIS — E7800 Pure hypercholesterolemia, unspecified: Secondary | ICD-10-CM | POA: Diagnosis not present

## 2020-02-01 DIAGNOSIS — I2510 Atherosclerotic heart disease of native coronary artery without angina pectoris: Secondary | ICD-10-CM | POA: Diagnosis not present

## 2020-02-01 DIAGNOSIS — S91301A Unspecified open wound, right foot, initial encounter: Secondary | ICD-10-CM | POA: Diagnosis not present

## 2020-02-01 DIAGNOSIS — F321 Major depressive disorder, single episode, moderate: Secondary | ICD-10-CM | POA: Diagnosis not present

## 2020-02-01 DIAGNOSIS — Z992 Dependence on renal dialysis: Secondary | ICD-10-CM | POA: Diagnosis not present

## 2020-02-02 DIAGNOSIS — N2581 Secondary hyperparathyroidism of renal origin: Secondary | ICD-10-CM | POA: Diagnosis not present

## 2020-02-02 DIAGNOSIS — Z992 Dependence on renal dialysis: Secondary | ICD-10-CM | POA: Diagnosis not present

## 2020-02-02 DIAGNOSIS — N186 End stage renal disease: Secondary | ICD-10-CM | POA: Diagnosis not present

## 2020-02-03 ENCOUNTER — Emergency Department (HOSPITAL_COMMUNITY)
Admission: EM | Admit: 2020-02-03 | Discharge: 2020-02-03 | Disposition: A | Discharge status: Home / Self Care | Payer: Medicare HMO | Source: Home / Self Care | Attending: Emergency Medicine | Admitting: Emergency Medicine

## 2020-02-03 DIAGNOSIS — Z66 Do not resuscitate: Secondary | ICD-10-CM | POA: Diagnosis not present

## 2020-02-03 DIAGNOSIS — I132 Hypertensive heart and chronic kidney disease with heart failure and with stage 5 chronic kidney disease, or end stage renal disease: Secondary | ICD-10-CM | POA: Diagnosis present

## 2020-02-03 DIAGNOSIS — T82590A Other mechanical complication of surgically created arteriovenous fistula, initial encounter: Secondary | ICD-10-CM | POA: Insufficient documentation

## 2020-02-03 DIAGNOSIS — K559 Vascular disorder of intestine, unspecified: Secondary | ICD-10-CM | POA: Diagnosis present

## 2020-02-03 DIAGNOSIS — R55 Syncope and collapse: Secondary | ICD-10-CM | POA: Diagnosis not present

## 2020-02-03 DIAGNOSIS — I700 Atherosclerosis of aorta: Secondary | ICD-10-CM | POA: Diagnosis not present

## 2020-02-03 DIAGNOSIS — R0902 Hypoxemia: Secondary | ICD-10-CM | POA: Diagnosis not present

## 2020-02-03 DIAGNOSIS — Z992 Dependence on renal dialysis: Secondary | ICD-10-CM | POA: Diagnosis not present

## 2020-02-03 DIAGNOSIS — E118 Type 2 diabetes mellitus with unspecified complications: Secondary | ICD-10-CM | POA: Diagnosis not present

## 2020-02-03 DIAGNOSIS — Z515 Encounter for palliative care: Secondary | ICD-10-CM | POA: Diagnosis not present

## 2020-02-03 DIAGNOSIS — K5900 Constipation, unspecified: Secondary | ICD-10-CM | POA: Diagnosis not present

## 2020-02-03 DIAGNOSIS — E7800 Pure hypercholesterolemia, unspecified: Secondary | ICD-10-CM | POA: Diagnosis not present

## 2020-02-03 DIAGNOSIS — I739 Peripheral vascular disease, unspecified: Secondary | ICD-10-CM | POA: Diagnosis not present

## 2020-02-03 DIAGNOSIS — D72829 Elevated white blood cell count, unspecified: Secondary | ICD-10-CM | POA: Diagnosis not present

## 2020-02-03 DIAGNOSIS — E785 Hyperlipidemia, unspecified: Secondary | ICD-10-CM | POA: Diagnosis present

## 2020-02-03 DIAGNOSIS — K7290 Hepatic failure, unspecified without coma: Secondary | ICD-10-CM | POA: Diagnosis present

## 2020-02-03 DIAGNOSIS — I120 Hypertensive chronic kidney disease with stage 5 chronic kidney disease or end stage renal disease: Secondary | ICD-10-CM | POA: Diagnosis not present

## 2020-02-03 DIAGNOSIS — N390 Urinary tract infection, site not specified: Secondary | ICD-10-CM | POA: Diagnosis present

## 2020-02-03 DIAGNOSIS — K759 Inflammatory liver disease, unspecified: Secondary | ICD-10-CM | POA: Diagnosis not present

## 2020-02-03 DIAGNOSIS — R531 Weakness: Secondary | ICD-10-CM | POA: Diagnosis not present

## 2020-02-03 DIAGNOSIS — Z8546 Personal history of malignant neoplasm of prostate: Secondary | ICD-10-CM | POA: Diagnosis not present

## 2020-02-03 DIAGNOSIS — E1129 Type 2 diabetes mellitus with other diabetic kidney complication: Secondary | ICD-10-CM | POA: Diagnosis not present

## 2020-02-03 DIAGNOSIS — R1310 Dysphagia, unspecified: Secondary | ICD-10-CM | POA: Diagnosis present

## 2020-02-03 DIAGNOSIS — E1122 Type 2 diabetes mellitus with diabetic chronic kidney disease: Secondary | ICD-10-CM | POA: Insufficient documentation

## 2020-02-03 DIAGNOSIS — T82838A Hemorrhage of vascular prosthetic devices, implants and grafts, initial encounter: Secondary | ICD-10-CM | POA: Diagnosis not present

## 2020-02-03 DIAGNOSIS — I70269 Atherosclerosis of native arteries of extremities with gangrene, unspecified extremity: Secondary | ICD-10-CM | POA: Diagnosis present

## 2020-02-03 DIAGNOSIS — N2889 Other specified disorders of kidney and ureter: Secondary | ICD-10-CM | POA: Diagnosis not present

## 2020-02-03 DIAGNOSIS — R188 Other ascites: Secondary | ICD-10-CM | POA: Diagnosis not present

## 2020-02-03 DIAGNOSIS — T829XXA Unspecified complication of cardiac and vascular prosthetic device, implant and graft, initial encounter: Secondary | ICD-10-CM

## 2020-02-03 DIAGNOSIS — Z9049 Acquired absence of other specified parts of digestive tract: Secondary | ICD-10-CM | POA: Diagnosis not present

## 2020-02-03 DIAGNOSIS — Z950 Presence of cardiac pacemaker: Secondary | ICD-10-CM | POA: Insufficient documentation

## 2020-02-03 DIAGNOSIS — E1152 Type 2 diabetes mellitus with diabetic peripheral angiopathy with gangrene: Secondary | ICD-10-CM | POA: Diagnosis present

## 2020-02-03 DIAGNOSIS — R6510 Systemic inflammatory response syndrome (SIRS) of non-infectious origin without acute organ dysfunction: Secondary | ICD-10-CM | POA: Diagnosis present

## 2020-02-03 DIAGNOSIS — R0989 Other specified symptoms and signs involving the circulatory and respiratory systems: Secondary | ICD-10-CM | POA: Diagnosis not present

## 2020-02-03 DIAGNOSIS — I509 Heart failure, unspecified: Secondary | ICD-10-CM | POA: Insufficient documentation

## 2020-02-03 DIAGNOSIS — R7401 Elevation of levels of liver transaminase levels: Secondary | ICD-10-CM | POA: Diagnosis not present

## 2020-02-03 DIAGNOSIS — I959 Hypotension, unspecified: Secondary | ICD-10-CM | POA: Diagnosis present

## 2020-02-03 DIAGNOSIS — K861 Other chronic pancreatitis: Secondary | ICD-10-CM | POA: Diagnosis present

## 2020-02-03 DIAGNOSIS — N186 End stage renal disease: Secondary | ICD-10-CM | POA: Diagnosis present

## 2020-02-03 DIAGNOSIS — I2510 Atherosclerotic heart disease of native coronary artery without angina pectoris: Secondary | ICD-10-CM | POA: Diagnosis present

## 2020-02-03 DIAGNOSIS — R4020 Unspecified coma: Secondary | ICD-10-CM | POA: Diagnosis not present

## 2020-02-03 DIAGNOSIS — N2581 Secondary hyperparathyroidism of renal origin: Secondary | ICD-10-CM | POA: Diagnosis present

## 2020-02-03 DIAGNOSIS — N281 Cyst of kidney, acquired: Secondary | ICD-10-CM | POA: Diagnosis not present

## 2020-02-03 DIAGNOSIS — R4182 Altered mental status, unspecified: Secondary | ICD-10-CM | POA: Diagnosis not present

## 2020-02-03 DIAGNOSIS — J9621 Acute and chronic respiratory failure with hypoxia: Secondary | ICD-10-CM | POA: Diagnosis not present

## 2020-02-03 DIAGNOSIS — I10 Essential (primary) hypertension: Secondary | ICD-10-CM | POA: Diagnosis not present

## 2020-02-03 DIAGNOSIS — K7200 Acute and subacute hepatic failure without coma: Secondary | ICD-10-CM | POA: Diagnosis present

## 2020-02-03 DIAGNOSIS — Z743 Need for continuous supervision: Secondary | ICD-10-CM | POA: Diagnosis not present

## 2020-02-03 DIAGNOSIS — Z8673 Personal history of transient ischemic attack (TIA), and cerebral infarction without residual deficits: Secondary | ICD-10-CM | POA: Diagnosis not present

## 2020-02-03 DIAGNOSIS — I350 Nonrheumatic aortic (valve) stenosis: Secondary | ICD-10-CM | POA: Diagnosis present

## 2020-02-03 DIAGNOSIS — D689 Coagulation defect, unspecified: Secondary | ICD-10-CM | POA: Diagnosis present

## 2020-02-03 DIAGNOSIS — D631 Anemia in chronic kidney disease: Secondary | ICD-10-CM | POA: Diagnosis present

## 2020-02-03 DIAGNOSIS — R7989 Other specified abnormal findings of blood chemistry: Secondary | ICD-10-CM | POA: Diagnosis not present

## 2020-02-03 DIAGNOSIS — J9601 Acute respiratory failure with hypoxia: Secondary | ICD-10-CM | POA: Diagnosis not present

## 2020-02-03 DIAGNOSIS — I459 Conduction disorder, unspecified: Secondary | ICD-10-CM | POA: Diagnosis present

## 2020-02-03 DIAGNOSIS — N201 Calculus of ureter: Secondary | ICD-10-CM | POA: Diagnosis not present

## 2020-02-03 DIAGNOSIS — R6889 Other general symptoms and signs: Secondary | ICD-10-CM | POA: Diagnosis not present

## 2020-02-03 DIAGNOSIS — Z20822 Contact with and (suspected) exposure to covid-19: Secondary | ICD-10-CM | POA: Diagnosis present

## 2020-02-03 DIAGNOSIS — E875 Hyperkalemia: Secondary | ICD-10-CM | POA: Diagnosis not present

## 2020-02-03 DIAGNOSIS — G319 Degenerative disease of nervous system, unspecified: Secondary | ICD-10-CM | POA: Diagnosis not present

## 2020-02-03 DIAGNOSIS — R404 Transient alteration of awareness: Secondary | ICD-10-CM | POA: Diagnosis not present

## 2020-02-03 DIAGNOSIS — E1165 Type 2 diabetes mellitus with hyperglycemia: Secondary | ICD-10-CM | POA: Diagnosis not present

## 2020-02-03 DIAGNOSIS — G9340 Encephalopathy, unspecified: Secondary | ICD-10-CM | POA: Diagnosis not present

## 2020-02-03 DIAGNOSIS — K3189 Other diseases of stomach and duodenum: Secondary | ICD-10-CM | POA: Diagnosis not present

## 2020-02-03 DIAGNOSIS — N200 Calculus of kidney: Secondary | ICD-10-CM | POA: Diagnosis not present

## 2020-02-03 NOTE — ED Triage Notes (Signed)
Pt arrives via EMS from home with complaints of fistula bleed. Pt reports slipping on toilet and hit left arm. Fistula began to bleed. Bleeding controlled on arrival. MD at bedside. When unwrapped fistula began to bleed. MD applied combat guaze to stop bleeding. Thrill felt after dressing applied

## 2020-02-03 NOTE — Discharge Instructions (Addendum)
If you have any recurrent bleeding from your fistula site, return to ER for reassessment. Recommend recheck with both your primary doctor as well as your vascular surgeon.

## 2020-02-03 NOTE — ED Provider Notes (Signed)
[facility] EMERGENCY DEPARTMENT Provider Note   CSN: [id number] Arrival date & time: 02/03/20  3614     History Chief Complaint  Patient presents with  . fisula bleed    Sean Best is a 86 y.o. male.  Presents to ER with concern for bleeding fistula.  Patient states that he had a minor slip and dumping hit his left arm and fistula began to bleed.  Bleeding stopped after fire applied dressing.  He states similar episode happened in 2020.  Denies any other recent problems with his fistula.  Denies any other complaints.  No chest pains or shortness of breath, lightheadedness or other acute complaints.  No issues with dialysis sessions recently.  Not on blood thinners.  HPI     Past Medical History:  Diagnosis Date  . Anemia   . Arthritis    "knees" (04/11/2016)  . Blind left eye    S/P trauma  . CHF (congestive heart failure) ([place])   . Chronic total occlusion of artery of the extremities (HCC)    pt not aware of this  . Claudication ([place])   . ESRD on dialysis [facility])    "MWF; [name]" ((04/10/2016)  . ESRD on peritoneal dialysis [facility])    Started dialysis around April 2015 per son.  Has been doing peritoneal dialysis at home.     . Gout   . Hyperlipidemia   . Hypertension   . Overweight(278.02)   . Presence of permanent cardiac pacemaker    medtronic  . Prostate cancer ([place]) 1990s  . Shingles   . Stroke [facility]) ~1998   denies residual on 04/11/2016  . Type II diabetes mellitus (HCC)    diet controlled  . Umbilical hernia 4/31/5400    Patient Active Problem List   Diagnosis Date Noted  . End-stage renal disease on hemodialysis ([place]) 09/20/2019  . HLD (hyperlipidemia) 09/20/2019  . Prostate cancer ([place]) 09/20/2019  . Umbilical hernia 86/76/1950  . Acute pancreatitis 09/20/2019  . History of noncompliance with medical treatment 09/20/2019  . Diabetes mellitus type 2, uncontrolled, with complications ([place]) 93/26/7124  . Acute GI bleeding  09/20/2019  . Unspecified protein-calorie malnutrition ([place]) 08/04/2019  . Shoulder pain, bilateral 11/22/2018  . Impingement syndrome of right shoulder 11/08/2018  . Hypertensive emergency 10/16/2018  . Hypertensive encephalopathy 10/15/2018  . Allergy, unspecified, initial encounter 10/05/2018  . Anaphylactic shock, unspecified, initial encounter 10/05/2018  . Other fatigue 07/06/2018  . Headache 06/10/2018  . Left bundle-branch block, unspecified 02/14/2018  . Left bundle branch block (LBBB) determined by electrocardiography 02/13/2018  . Aortic stenosis 02/13/2018  . NSTEMI (non-ST elevated myocardial infarction) ([place]) 02/13/2018  . Constipation 12/08/2017  . Abdominal pain 12/06/2017  . Leukocytosis 12/06/2017  . Gout 12/06/2017  . Chronic pain 12/06/2017  . Acute on chronic cholecystitis s/p lap cholecystectomy 12/02/2017 12/02/2017  . Cardiac pacemaker in situ (Medtronic DDD) 12/02/2017  . Encounter for immunization 11/24/2017  . Abnormality of albumin 09/22/2017  . Secondary hyperparathyroidism of renal origin ([place]) 09/20/2017  . Unspecified Escherichia coli (E. coli) as the cause of diseases classified elsewhere 09/15/2017  . Chills (without fever) 09/06/2017  . Hypercalcemia 05/18/2017  . Anemia in chronic kidney disease 05/03/2017  . Fever, unspecified 04/30/2017  . Iron deficiency anemia, unspecified 04/30/2017  . Other specified coagulation defects ([place]) 04/30/2017  . Pruritus, unspecified 04/30/2017  . Shortness of breath 04/30/2017  . Type 2 diabetes mellitus with diabetic peripheral angiopathy without gangrene ([place]) 04/30/2017  . Acute hypoxemic  respiratory failure ([place]) 04/18/2017  . Heart block 04/17/2017  . Elevated troponin 04/17/2017  . ESRD on hemodialysis ([place]) 04/12/2016  . H/O: stroke 04/11/2016  . Saccular aneurysm of infrarenal aorta with thrombosed dissection of abdominal aorta 04/11/2016  . Obesity (BMI 30-39.9) 04/11/2016  . Syncope 04/11/2016  .  Hypothyroidism, unspecified 05/01/2015  . Prostate CA ([place]) 12/28/2014  . Chest pain 12/28/2014  . C. difficile colitis 10/04/2014  . Hyperkalemia 03/11/2013  . Anemia 03/11/2013  . DM (diabetes mellitus), type 2 with renal complications, diet controlled 03/11/2013  . HTN (hypertension) 03/11/2013  . Hyperlipidemia 03/11/2013  . ESRD on dialysis [facility]) 03/20/2011    Past Surgical History:  Procedure Laterality Date  . A/V FISTULAGRAM N/A 11/01/2018   Procedure: A/V FISTULAGRAM;  Surgeon: Serafina Mitchell, MD;  Location: [place] CV LAB;  Service: Cardiovascular;  Laterality: N/A;  . AV FISTULA PLACEMENT, RADIOCEPHALIC  86/57/8469   Left arm  . CAPD INSERTION N/A 03/06/2013   Procedure: LAPAROSCOPIC INSERTION CONTINUOUS AMBULATORY PERITONEAL DIALYSIS  (CAPD) CATHETER;  Surgeon: Edward Jolly, MD;  Location: [place];  Service: General;  Laterality: N/A;  . CATARACT EXTRACTION Right   . CHOLECYSTECTOMY  12/02/2017   LAPROSCOPIC  . CORONARY STENT INTERVENTION N/A 02/11/2018   Procedure: CORONARY STENT INTERVENTION;  Surgeon: Sherren Mocha, MD;  Location: [place] CV LAB;  Service: Cardiovascular;  Laterality: N/A;  . ESOPHAGOGASTRODUODENOSCOPY (EGD) WITH PROPOFOL N/A 10/18/2019   Procedure: ESOPHAGOGASTRODUODENOSCOPY (EGD) WITH PROPOFOL;  Surgeon: Arta Silence, MD;  Location: [facility]OS[facility];  Service: Endoscopy;  Laterality: N/A;  . EUS N/A 10/18/2019   Procedure: FULL UPPER ENDOSCOPIC ULTRASOUND (EUS) RADIAL;  Surgeon: Arta Silence, MD;  Location: [facility];  Service: Endoscopy;  Laterality: N/A;  . EYE SURGERY Left 1970s   for eye injury  . FISTULA SUPERFICIALIZATION Left 62/11/5282   Procedure: PLICATION OF LEFT RADIAL CEPHALIC ANEURYSM;  Surgeon: Rosetta Posner, MD;  Location: [place];  Service: Vascular;  Laterality: Left;  . HERNIA REPAIR    . INSERTION PROSTATE RADIATION SEED    . LAPAROSCOPIC CHOLECYSTECTOMY SINGLE SITE WITH INTRAOPERATIVE CHOLANGIOGRAM N/A 12/02/2017    Procedure: LAPAROSCOPIC CHOLECYSTECTOMY;  Surgeon: Michael Boston, MD;  Location: [place];  Service: General;  Laterality: N/A;  . LEFT HEART CATH AND CORONARY ANGIOGRAPHY N/A 02/11/2018   Procedure: LEFT HEART CATH AND CORONARY ANGIOGRAPHY;  Surgeon: Sherren Mocha, MD;  Location: [place] CV LAB;  Service: Cardiovascular;  Laterality: N/A;  . PACEMAKER IMPLANT N/A 04/20/2017   Procedure: PACEMAKER IMPLANT;  Surgeon: Evans Lance, MD;  Location: [place] CV LAB;  Service: Cardiovascular;  Laterality: N/A;  . PERIPHERAL VASCULAR BALLOON ANGIOPLASTY  11/01/2018   Procedure: PERIPHERAL VASCULAR BALLOON ANGIOPLASTY;  Surgeon: Serafina Mitchell, MD;  Location: [facility] CV LAB;  Service: Cardiovascular;;  Lt lower arm fistula   . UMBILICAL HERNIA REPAIR N/A 03/06/2013   Procedure: HERNIA REPAIR UMBILICAL WITH MESH;  Surgeon: Edward Jolly, MD;  Location: [facility] OR;  Service: General;  Laterality: N/A;       Family History  Problem Relation Age of Onset  . Cancer Mother   . Heart disease Mother   . Cancer Father     Social History   Tobacco Use  . Smoking status: Never Smoker  . Smokeless tobacco: Never Used  Vaping Use  . Vaping Use: Never used  Substance Use Topics  . Alcohol use: No  . Drug use: No    Home Medications Prior to Admission  medications   Medication Sig Start Date End Date Taking? Authorizing Provider  acetaminophen (TYLENOL) 500 MG tablet Take 1,000 mg by mouth every 6 (six) hours as needed for mild pain or headache.    [provider]  amLODipine (NORVASC) 5 MG tablet Take 5 mg by mouth daily. 08/18/19   [provider]  aspirin 81 MG chewable tablet Chew 1 tablet (81 mg total) by mouth daily. 02/14/18   Desiree Hane, MD  atorvastatin (LIPITOR) 40 MG tablet Take 40 mg by mouth every evening.  07/07/17   [provider]  Blood Glucose Calibration (TRUE METRIX LEVEL 2) Normal SOLN  09/14/19   [provider]  Blood Glucose  Monitoring Suppl (TRUE METRIX METER) w/Device KIT  09/14/19   [provider]  calcitRIOL (ROCALTROL) 0.5 MCG capsule Take 2 capsules (1 mcg total) by mouth every Monday, Wednesday, and Friday with hemodialysis. 09/27/19   Allie Bossier, MD  calcium acetate (PHOSLO) 667 MG capsule Take 3 capsules (2,001 mg total) by mouth 3 (three) times daily with meals. 04/30/18   Nita Sells, MD  chlorhexidine (HIBICLENS) 4 % external liquid Apply topically daily as needed. Put 2 tablespoons in a basin of warm water and soak your foot for the next 7 days 01/24/20   Montine Circle, PA-C  cinacalcet [facility]) 90 MG tablet Take 90 mg by mouth every Monday, Wednesday, and Friday.     [provider]  cyclobenzaprine (FLEXERIL) 10 MG tablet  12/30/19   [provider]  Darbepoetin Alfa (ARANESP) 60 MCG/0.3ML SOSY injection Inject 0.3 mLs (60 mcg total) into the vein every Thursday with hemodialysis. Patient taking differently: Inject 60 mcg into the vein every 7 (seven) days. On Fridays 09/28/19   Allie Bossier, MD  ELDERBERRY PO Take 1 capsule by mouth daily. Gummie     [provider]  ferrous sulfate 325 (65 FE) MG EC tablet Take 325 mg by mouth daily with breakfast.     [provider]  lidocaine-prilocaine (EMLA) cream Apply 1 application topically as needed (for port access).  10/28/17   [provider]  loperamide (IMODIUM) 2 MG capsule Take 1 capsule (2 mg total) by mouth every 6 (six) hours as needed for diarrhea or loose stools. 10/18/18   Arrien, Jimmy Picket, MD  Methoxy PEG-Epoetin Beta (MIRCERA IJ) Mircera 10/11/19 10/09/20  [provider]  metoprolol tartrate (LOPRESSOR) 25 MG tablet Take 0.5 tablets (12.5 mg total) by mouth 2 (two) times daily. 09/26/19   Allie Bossier, MD  nitroGLYCERIN (NITROSTAT) 0.4 MG SL tablet Place 1 tablet (0.4 mg total) under the tongue every 5 (five) minutes as needed for chest pain. Patient not taking:  Reported on 01/30/2020 04/18/19   Leonie Man, MD  oxyCODONE-acetaminophen (PERCOCET) 5-325 MG tablet Take 1 tablet by mouth every 6 (six) hours as needed for severe pain. 01/02/20   Early, Arvilla Meres, MD  traMADol (ULTRAM) 50 MG tablet Take 1 tablet (50 mg total) by mouth 2 (two) times daily as needed for moderate pain. 01/30/20   Dagoberto Ligas, PA-C  TRUE METRIX BLOOD GLUCOSE TEST test strip SMARTSIG:Via Meter 09/14/19   [provider]  TRUEplus Lancets 30G MISC  09/14/19   [provider]  VELPHORO 500 MG chewable tablet Chew 500 mg by mouth 3 (three) times daily with meals.  01/28/18   [provider]  VITAMIN D PO Take by mouth. 01/05/20 01/03/21  [provider]  Allergies    Cardura [doxazosin mesylate] and Tape  Review of Systems   Review of Systems  Constitutional: Negative for chills and fever.  HENT: Negative for ear pain and sore throat.   Eyes: Negative for pain and visual disturbance.  Respiratory: Negative for cough and shortness of breath.   Cardiovascular: Negative for chest pain and palpitations.  Gastrointestinal: Negative for abdominal pain and vomiting.  Genitourinary: Negative for dysuria and hematuria.  Musculoskeletal: Negative for arthralgias and back pain.  Skin: Negative for color change and rash.  Neurological: Negative for seizures and syncope.  All other systems reviewed and are negative.   Physical Exam Updated Vital Signs BP (!) 149/88 (BP Location: Right Arm)   Pulse 69   Temp 97.8 F (36.6 C) (Oral)   Resp 18   Ht '5\' 5"'  (1.651 m)   Wt 79 kg   SpO2 96%   BMI 28.98 kg/m   Physical Exam Vitals and nursing note reviewed.  Constitutional:      Appearance: He is well-developed.  HENT:     Head: Normocephalic and atraumatic.  Eyes:     Conjunctiva/sclera: Conjunctivae normal.  Cardiovascular:     Rate and Rhythm: Normal rate and regular rhythm.     Heart sounds: No murmur heard.   Pulmonary:      Effort: Pulmonary effort is normal. No respiratory distress.     Breath sounds: Normal breath sounds.  Abdominal:     Palpations: Abdomen is soft.     Tenderness: There is no abdominal tenderness.  Musculoskeletal:        General: No deformity or signs of injury.     Cervical back: Neck supple.     Comments: Left forearm fistula is intact, there is punctate bleeding from fistula site, thrill intact  Skin:    General: Skin is warm and dry.     Comments: See above  Neurological:     General: No focal deficit present.     Mental Status: He is alert and oriented to person, place, and time.  Psychiatric:        Mood and Affect: Mood normal.        Behavior: Behavior normal.     ED Results / Procedures / Treatments   Labs (all labs ordered are listed, but only abnormal results are displayed) Labs Reviewed - No data to display  EKG None  Radiology No results found.  Procedures Procedures (including critical care time)  Medications Ordered in ED Medications - No data to display  ED Course  I have reviewed the triage vital signs and the nursing notes.  Pertinent labs & imaging results that were available during my care of the patient were reviewed by me and considered in my medical decision making (see chart for details).    MDM Rules/Calculators/A&P                         86 year old male presented to ER with concern for bleeding fistula.  Bleeding was controlled from dressing applied prior to arrival.  When I remove this dressing, noted to have punctate area of arterial appearing bleeding.  Applied combat gauze and applied pressure.  Upon removal of dressing after a few minutes, area of bleeding had stopped.  Patient was observed in ER for around 2 hours and he had no recurrence of bleeding.  Thrill is intact.  He has no other acute medical complaints, stable vital signs and is appropriate for  discharge and outpatient management.  Reviewed return precautions.  Recommended very  gentle dressing to keep area protected.   After the discussed management above, the patient was determined to be safe for discharge.  The patient was in agreement with this plan and all questions regarding their care were answered.  ED return precautions were discussed and the patient will return to the ED with any significant worsening of condition.   Final Clinical Impression(s) / ED Diagnoses Final diagnoses:  Complication of arteriovenous dialysis fistula, initial encounter    Rx / DC Orders ED Discharge Orders    None       Lucrezia Starch, MD 02/03/20 1058

## 2020-02-05 ENCOUNTER — Emergency Department (HOSPITAL_COMMUNITY): Payer: Medicare HMO

## 2020-02-05 ENCOUNTER — Encounter (HOSPITAL_COMMUNITY): Payer: Self-pay | Admitting: *Deleted

## 2020-02-05 ENCOUNTER — Inpatient Hospital Stay (HOSPITAL_COMMUNITY)
Admission: EM | Admit: 2020-02-05 | Discharge: 2020-03-02 | DRG: 441 | Disposition: E | Payer: Medicare HMO | Attending: Internal Medicine | Admitting: Internal Medicine

## 2020-02-05 ENCOUNTER — Other Ambulatory Visit: Payer: Self-pay

## 2020-02-05 DIAGNOSIS — N2581 Secondary hyperparathyroidism of renal origin: Secondary | ICD-10-CM | POA: Diagnosis present

## 2020-02-05 DIAGNOSIS — R531 Weakness: Secondary | ICD-10-CM

## 2020-02-05 DIAGNOSIS — R6510 Systemic inflammatory response syndrome (SIRS) of non-infectious origin without acute organ dysfunction: Secondary | ICD-10-CM | POA: Diagnosis present

## 2020-02-05 DIAGNOSIS — R1310 Dysphagia, unspecified: Secondary | ICD-10-CM | POA: Diagnosis present

## 2020-02-05 DIAGNOSIS — I10 Essential (primary) hypertension: Secondary | ICD-10-CM | POA: Diagnosis present

## 2020-02-05 DIAGNOSIS — N281 Cyst of kidney, acquired: Secondary | ICD-10-CM | POA: Diagnosis present

## 2020-02-05 DIAGNOSIS — I70269 Atherosclerosis of native arteries of extremities with gangrene, unspecified extremity: Secondary | ICD-10-CM | POA: Diagnosis present

## 2020-02-05 DIAGNOSIS — E7220 Disorder of urea cycle metabolism, unspecified: Secondary | ICD-10-CM

## 2020-02-05 DIAGNOSIS — K7682 Hepatic encephalopathy: Secondary | ICD-10-CM

## 2020-02-05 DIAGNOSIS — N186 End stage renal disease: Secondary | ICD-10-CM

## 2020-02-05 DIAGNOSIS — R7401 Elevation of levels of liver transaminase levels: Secondary | ICD-10-CM | POA: Diagnosis not present

## 2020-02-05 DIAGNOSIS — I959 Hypotension, unspecified: Secondary | ICD-10-CM | POA: Diagnosis present

## 2020-02-05 DIAGNOSIS — N390 Urinary tract infection, site not specified: Secondary | ICD-10-CM | POA: Diagnosis present

## 2020-02-05 DIAGNOSIS — Z515 Encounter for palliative care: Secondary | ICD-10-CM | POA: Diagnosis not present

## 2020-02-05 DIAGNOSIS — K5900 Constipation, unspecified: Secondary | ICD-10-CM | POA: Diagnosis present

## 2020-02-05 DIAGNOSIS — E7800 Pure hypercholesterolemia, unspecified: Secondary | ICD-10-CM | POA: Diagnosis not present

## 2020-02-05 DIAGNOSIS — R7989 Other specified abnormal findings of blood chemistry: Secondary | ICD-10-CM

## 2020-02-05 DIAGNOSIS — E785 Hyperlipidemia, unspecified: Secondary | ICD-10-CM | POA: Diagnosis present

## 2020-02-05 DIAGNOSIS — K759 Inflammatory liver disease, unspecified: Secondary | ICD-10-CM | POA: Diagnosis not present

## 2020-02-05 DIAGNOSIS — Z992 Dependence on renal dialysis: Secondary | ICD-10-CM | POA: Diagnosis not present

## 2020-02-05 DIAGNOSIS — N189 Chronic kidney disease, unspecified: Secondary | ICD-10-CM | POA: Diagnosis present

## 2020-02-05 DIAGNOSIS — J9601 Acute respiratory failure with hypoxia: Secondary | ICD-10-CM | POA: Diagnosis not present

## 2020-02-05 DIAGNOSIS — N200 Calculus of kidney: Secondary | ICD-10-CM | POA: Diagnosis present

## 2020-02-05 DIAGNOSIS — Z8546 Personal history of malignant neoplasm of prostate: Secondary | ICD-10-CM

## 2020-02-05 DIAGNOSIS — D631 Anemia in chronic kidney disease: Secondary | ICD-10-CM | POA: Diagnosis present

## 2020-02-05 DIAGNOSIS — I252 Old myocardial infarction: Secondary | ICD-10-CM

## 2020-02-05 DIAGNOSIS — K7290 Hepatic failure, unspecified without coma: Secondary | ICD-10-CM

## 2020-02-05 DIAGNOSIS — Z950 Presence of cardiac pacemaker: Secondary | ICD-10-CM

## 2020-02-05 DIAGNOSIS — R4182 Altered mental status, unspecified: Secondary | ICD-10-CM | POA: Diagnosis not present

## 2020-02-05 DIAGNOSIS — Z20822 Contact with and (suspected) exposure to covid-19: Secondary | ICD-10-CM | POA: Diagnosis present

## 2020-02-05 DIAGNOSIS — I350 Nonrheumatic aortic (valve) stenosis: Secondary | ICD-10-CM | POA: Diagnosis present

## 2020-02-05 DIAGNOSIS — I459 Conduction disorder, unspecified: Secondary | ICD-10-CM | POA: Diagnosis present

## 2020-02-05 DIAGNOSIS — IMO0002 Reserved for concepts with insufficient information to code with codable children: Secondary | ICD-10-CM | POA: Diagnosis present

## 2020-02-05 DIAGNOSIS — D649 Anemia, unspecified: Secondary | ICD-10-CM | POA: Diagnosis present

## 2020-02-05 DIAGNOSIS — I509 Heart failure, unspecified: Secondary | ICD-10-CM | POA: Diagnosis present

## 2020-02-05 DIAGNOSIS — E1165 Type 2 diabetes mellitus with hyperglycemia: Secondary | ICD-10-CM | POA: Diagnosis not present

## 2020-02-05 DIAGNOSIS — D689 Coagulation defect, unspecified: Secondary | ICD-10-CM | POA: Diagnosis present

## 2020-02-05 DIAGNOSIS — I2510 Atherosclerotic heart disease of native coronary artery without angina pectoris: Secondary | ICD-10-CM | POA: Diagnosis present

## 2020-02-05 DIAGNOSIS — I132 Hypertensive heart and chronic kidney disease with heart failure and with stage 5 chronic kidney disease, or end stage renal disease: Secondary | ICD-10-CM | POA: Diagnosis present

## 2020-02-05 DIAGNOSIS — E663 Overweight: Secondary | ICD-10-CM | POA: Diagnosis present

## 2020-02-05 DIAGNOSIS — R54 Age-related physical debility: Secondary | ICD-10-CM | POA: Diagnosis present

## 2020-02-05 DIAGNOSIS — M170 Bilateral primary osteoarthritis of knee: Secondary | ICD-10-CM | POA: Diagnosis present

## 2020-02-05 DIAGNOSIS — Z9049 Acquired absence of other specified parts of digestive tract: Secondary | ICD-10-CM

## 2020-02-05 DIAGNOSIS — E118 Type 2 diabetes mellitus with unspecified complications: Secondary | ICD-10-CM | POA: Diagnosis not present

## 2020-02-05 DIAGNOSIS — I120 Hypertensive chronic kidney disease with stage 5 chronic kidney disease or end stage renal disease: Secondary | ICD-10-CM | POA: Diagnosis not present

## 2020-02-05 DIAGNOSIS — Z6828 Body mass index (BMI) 28.0-28.9, adult: Secondary | ICD-10-CM

## 2020-02-05 DIAGNOSIS — K7200 Acute and subacute hepatic failure without coma: Principal | ICD-10-CM | POA: Diagnosis present

## 2020-02-05 DIAGNOSIS — K559 Vascular disorder of intestine, unspecified: Secondary | ICD-10-CM | POA: Diagnosis present

## 2020-02-05 DIAGNOSIS — Z66 Do not resuscitate: Secondary | ICD-10-CM | POA: Diagnosis not present

## 2020-02-05 DIAGNOSIS — Z7982 Long term (current) use of aspirin: Secondary | ICD-10-CM

## 2020-02-05 DIAGNOSIS — E039 Hypothyroidism, unspecified: Secondary | ICD-10-CM | POA: Diagnosis present

## 2020-02-05 DIAGNOSIS — E1152 Type 2 diabetes mellitus with diabetic peripheral angiopathy with gangrene: Secondary | ICD-10-CM | POA: Diagnosis present

## 2020-02-05 DIAGNOSIS — K861 Other chronic pancreatitis: Secondary | ICD-10-CM | POA: Diagnosis present

## 2020-02-05 DIAGNOSIS — D72829 Elevated white blood cell count, unspecified: Secondary | ICD-10-CM | POA: Diagnosis not present

## 2020-02-05 DIAGNOSIS — G9340 Encephalopathy, unspecified: Secondary | ICD-10-CM | POA: Diagnosis not present

## 2020-02-05 DIAGNOSIS — Z8673 Personal history of transient ischemic attack (TIA), and cerebral infarction without residual deficits: Secondary | ICD-10-CM

## 2020-02-05 DIAGNOSIS — Z8249 Family history of ischemic heart disease and other diseases of the circulatory system: Secondary | ICD-10-CM

## 2020-02-05 DIAGNOSIS — E1122 Type 2 diabetes mellitus with diabetic chronic kidney disease: Secondary | ICD-10-CM | POA: Diagnosis present

## 2020-02-05 DIAGNOSIS — I719 Aortic aneurysm of unspecified site, without rupture: Secondary | ICD-10-CM | POA: Diagnosis present

## 2020-02-05 DIAGNOSIS — I708 Atherosclerosis of other arteries: Secondary | ICD-10-CM | POA: Diagnosis present

## 2020-02-05 DIAGNOSIS — J9621 Acute and chronic respiratory failure with hypoxia: Secondary | ICD-10-CM | POA: Diagnosis not present

## 2020-02-05 DIAGNOSIS — E1142 Type 2 diabetes mellitus with diabetic polyneuropathy: Secondary | ICD-10-CM | POA: Diagnosis present

## 2020-02-05 DIAGNOSIS — M109 Gout, unspecified: Secondary | ICD-10-CM | POA: Diagnosis present

## 2020-02-05 DIAGNOSIS — I272 Pulmonary hypertension, unspecified: Secondary | ICD-10-CM | POA: Diagnosis present

## 2020-02-05 DIAGNOSIS — Z79899 Other long term (current) drug therapy: Secondary | ICD-10-CM

## 2020-02-05 DIAGNOSIS — E1129 Type 2 diabetes mellitus with other diabetic kidney complication: Secondary | ICD-10-CM | POA: Diagnosis not present

## 2020-02-05 DIAGNOSIS — R0902 Hypoxemia: Secondary | ICD-10-CM

## 2020-02-05 DIAGNOSIS — E11649 Type 2 diabetes mellitus with hypoglycemia without coma: Secondary | ICD-10-CM | POA: Diagnosis present

## 2020-02-05 DIAGNOSIS — H5462 Unqualified visual loss, left eye, normal vision right eye: Secondary | ICD-10-CM | POA: Diagnosis present

## 2020-02-05 LAB — COMPREHENSIVE METABOLIC PANEL
ALT: 439 U/L — ABNORMAL HIGH (ref 0–44)
AST: 578 U/L — ABNORMAL HIGH (ref 15–41)
Albumin: 3.1 g/dL — ABNORMAL LOW (ref 3.5–5.0)
Alkaline Phosphatase: 126 U/L (ref 38–126)
Anion gap: 25 — ABNORMAL HIGH (ref 5–15)
BUN: 33 mg/dL — ABNORMAL HIGH (ref 8–23)
CO2: 22 mmol/L (ref 22–32)
Calcium: 8.8 mg/dL — ABNORMAL LOW (ref 8.9–10.3)
Chloride: 89 mmol/L — ABNORMAL LOW (ref 98–111)
Creatinine, Ser: 7.59 mg/dL — ABNORMAL HIGH (ref 0.61–1.24)
GFR, Estimated: 7 mL/min — ABNORMAL LOW (ref >60)
Glucose, Bld: 79 mg/dL (ref 70–99)
Potassium: 4.6 mmol/L (ref 3.5–5.1)
Sodium: 136 mmol/L (ref 135–145)
Total Bilirubin: 1.5 mg/dL — ABNORMAL HIGH (ref 0.3–1.2)
Total Protein: 5.9 g/dL — ABNORMAL LOW (ref 6.5–8.1)

## 2020-02-05 LAB — CBC
HCT: 44.1 % (ref 39.0–52.0)
Hemoglobin: 13 g/dL (ref 13.0–17.0)
MCH: 25.9 pg — ABNORMAL LOW (ref 26.0–34.0)
MCHC: 29.5 g/dL — ABNORMAL LOW (ref 30.0–36.0)
MCV: 87.8 fL (ref 80.0–100.0)
Platelets: 225 10*3/uL (ref 150–400)
RBC: 5.02 MIL/uL (ref 4.22–5.81)
RDW: 21.3 % — ABNORMAL HIGH (ref 11.5–15.5)
WBC: 15.8 10*3/uL — ABNORMAL HIGH (ref 4.0–10.5)
nRBC: 0 % (ref 0.0–0.2)

## 2020-02-05 LAB — RESP PANEL BY RT-PCR (FLU A&B, COVID) ARPGX2
Influenza A by PCR: NEGATIVE
Influenza B by PCR: NEGATIVE
SARS Coronavirus 2 by RT PCR: NEGATIVE

## 2020-02-05 LAB — DIFFERENTIAL
Abs Immature Granulocytes: 0 10*3/uL (ref 0.00–0.07)
Basophils Absolute: 0 10*3/uL (ref 0.0–0.1)
Basophils Relative: 0 %
Eosinophils Absolute: 0 10*3/uL (ref 0.0–0.5)
Eosinophils Relative: 0 %
Lymphocytes Relative: 5 %
Lymphs Abs: 0.8 10*3/uL (ref 0.7–4.0)
Monocytes Absolute: 0.8 10*3/uL (ref 0.1–1.0)
Monocytes Relative: 5 %
Neutro Abs: 14.2 10*3/uL — ABNORMAL HIGH (ref 1.7–7.7)
Neutrophils Relative %: 90 %
nRBC: 0 /100 WBC

## 2020-02-05 LAB — CBG MONITORING, ED
Glucose-Capillary: 33 mg/dL — CL (ref 70–99)
Glucose-Capillary: 61 mg/dL — ABNORMAL LOW (ref 70–99)
Glucose-Capillary: 84 mg/dL (ref 70–99)
Glucose-Capillary: 88 mg/dL (ref 70–99)
Glucose-Capillary: 93 mg/dL (ref 70–99)
Glucose-Capillary: 97 mg/dL (ref 70–99)
Glucose-Capillary: 97 mg/dL (ref 70–99)

## 2020-02-05 LAB — ETHANOL: Alcohol, Ethyl (B): <10 mg/dL (ref <10)

## 2020-02-05 LAB — APTT: aPTT: 44 seconds — ABNORMAL HIGH (ref 24–36)

## 2020-02-05 LAB — PROTIME-INR
INR: 2.1 — ABNORMAL HIGH (ref 0.8–1.2)
Prothrombin Time: 22.9 seconds — ABNORMAL HIGH (ref 11.4–15.2)

## 2020-02-05 LAB — AMMONIA: Ammonia: 77 umol/L — ABNORMAL HIGH (ref 9–35)

## 2020-02-05 MED ORDER — CHLORHEXIDINE GLUCONATE CLOTH 2 % EX PADS
6.0000 | MEDICATED_PAD | Freq: Every day | CUTANEOUS | Status: DC
  Administered 2020-02-11 – 2020-02-13 (×3): 6 via TOPICAL

## 2020-02-05 MED ORDER — DEXTROSE 50 % IV SOLN
INTRAVENOUS | Status: AC
  Administered 2020-02-05: 25 g via INTRAVENOUS
  Filled 2020-02-05: qty 50

## 2020-02-05 MED ORDER — SODIUM CHLORIDE 0.9 % IV SOLN
2.0000 g | Freq: Once | INTRAVENOUS | Status: AC
  Administered 2020-02-05: 2 g via INTRAVENOUS
  Filled 2020-02-05: qty 20

## 2020-02-05 MED ORDER — LACTULOSE 10 GM/15ML PO SOLN
10.0000 g | Freq: Three times a day (TID) | ORAL | Status: DC
  Administered 2020-02-06 – 2020-02-13 (×15): 10 g via ORAL
  Filled 2020-02-05 (×18): qty 15

## 2020-02-05 MED ORDER — ASPIRIN 81 MG PO CHEW
81.0000 mg | CHEWABLE_TABLET | Freq: Every day | ORAL | Status: DC
  Administered 2020-02-07 – 2020-02-13 (×6): 81 mg via ORAL
  Filled 2020-02-05 (×6): qty 1

## 2020-02-05 MED ORDER — SODIUM CHLORIDE 0.9 % IV BOLUS
500.0000 mL | Freq: Once | INTRAVENOUS | Status: AC
  Administered 2020-02-05: 500 mL via INTRAVENOUS

## 2020-02-05 MED ORDER — HEPARIN SODIUM (PORCINE) 5000 UNIT/ML IJ SOLN
5000.0000 [IU] | Freq: Three times a day (TID) | INTRAMUSCULAR | Status: DC
  Administered 2020-02-05 – 2020-02-07 (×4): 5000 [IU] via SUBCUTANEOUS
  Filled 2020-02-05 (×6): qty 1

## 2020-02-05 MED ORDER — CALCIUM ACETATE (PHOS BINDER) 667 MG PO CAPS
2001.0000 mg | ORAL_CAPSULE | Freq: Three times a day (TID) | ORAL | Status: DC
  Administered 2020-02-07 – 2020-02-13 (×8): 2001 mg via ORAL
  Filled 2020-02-05 (×10): qty 3

## 2020-02-05 MED ORDER — DEXTROSE 10 % IV SOLN
5.0000 mL/kg | INTRAVENOUS | Status: DC
  Administered 2020-02-05: 522 mL via INTRAVENOUS

## 2020-02-05 MED ORDER — CINACALCET HCL 30 MG PO TABS
90.0000 mg | ORAL_TABLET | ORAL | Status: DC
  Administered 2020-02-07 – 2020-02-09 (×2): 90 mg via ORAL
  Filled 2020-02-05 (×3): qty 3

## 2020-02-05 MED ORDER — LACTULOSE 10 GM/15ML PO SOLN
20.0000 g | Freq: Once | ORAL | Status: AC
  Administered 2020-02-05: 20 g via ORAL
  Filled 2020-02-05: qty 30

## 2020-02-05 MED ORDER — ATORVASTATIN CALCIUM 40 MG PO TABS: 40.0000 mg | ORAL_TABLET | Freq: Every evening | ORAL | Status: DC

## 2020-02-05 MED ORDER — CALCITRIOL 0.5 MCG PO CAPS
1.0000 ug | ORAL_CAPSULE | ORAL | Status: DC
  Administered 2020-02-09: 1 ug via ORAL
  Filled 2020-02-05: qty 2

## 2020-02-05 MED ORDER — DEXTROSE 50 % IV SOLN: 25.0000 g | Freq: Once | INTRAVENOUS | Status: AC

## 2020-02-05 MED ORDER — SODIUM CHLORIDE 0.9 % IV SOLN
2.0000 g | INTRAVENOUS | Status: DC
  Administered 2020-02-06 – 2020-02-12 (×7): 2 g via INTRAVENOUS
  Filled 2020-02-05 (×7): qty 20

## 2020-02-05 MED ORDER — METOPROLOL TARTRATE 12.5 MG HALF TABLET
12.5000 mg | ORAL_TABLET | Freq: Two times a day (BID) | ORAL | Status: DC
  Administered 2020-02-07 – 2020-02-11 (×6): 12.5 mg via ORAL
  Filled 2020-02-05 (×11): qty 1

## 2020-02-05 MED ORDER — SODIUM CHLORIDE 0.9% FLUSH
3.0000 mL | Freq: Two times a day (BID) | INTRAVENOUS | Status: DC
  Administered 2020-02-06 – 2020-02-13 (×5): 3 mL via INTRAVENOUS

## 2020-02-05 MED ORDER — FERROUS SULFATE 325 (65 FE) MG PO TABS
325.0000 mg | ORAL_TABLET | Freq: Every day | ORAL | Status: DC
  Administered 2020-02-08 – 2020-02-13 (×5): 325 mg via ORAL
  Filled 2020-02-05 (×6): qty 1

## 2020-02-05 NOTE — ED Triage Notes (Signed)
Patient presents to ed via [facility] states patient has  Had decreased loc x 1 week with poor intake, also states he hasn't had a bowel movement in 1 week. Per ems daughter is currently treating him for an infection in right great toe. Patient is a T-TH -S dialysis patient left arm restricted. Patient is oriented to name and place only.

## 2020-02-05 NOTE — ED Provider Notes (Signed)
[place] EMERGENCY DEPARTMENT Provider Note   CSN: [id number] Arrival date & time: 02/03/2020  1204     History Chief Complaint  Patient presents with  . Altered Mental Status    Sean Best is a 86 y.o. male.  HPI   Patient presents to the ED for evaluation of altered mental status.  Patient has a history of multiple medical problems including chronic kidney disease.  He is on dialysis and according to the EMS report has been going to dialysis as scheduled.  Patient was brought into the ED for evaluation of increasing fatigue and weakness that started about a week ago.  Patient has had decreased p.o. intake.  He has not had normal bowel movement.  EMS was called this morning the patient was transported to the ED.  Patient states he feels aching all over.  He has been feeling fatigued.  He denies having any chest pain or shortness of breath.  He denies abdominal pain.  He does not notice any fevers.  Past Medical History:  Diagnosis Date  . Anemia   . Arthritis    "knees" (04/11/2016)  . Blind left eye    S/P trauma  . CHF (congestive heart failure) ([place])   . Chronic total occlusion of artery of the extremities (HCC)    pt not aware of this  . Claudication ([place])   . ESRD on dialysis [facility])    "MWF; [name]" ((04/10/2016)  . ESRD on peritoneal dialysis [facility])    Started dialysis around April 2015 per son.  Has been doing peritoneal dialysis at home.     . Gout   . Hyperlipidemia   . Hypertension   . Overweight(278.02)   . Presence of permanent cardiac pacemaker    medtronic  . Prostate cancer ([place]) 1990s  . Shingles   . Stroke [facility]) ~1998   denies residual on 04/11/2016  . Type II diabetes mellitus (HCC)    diet controlled  . Umbilical hernia 9/67/8938    Patient Active Problem List   Diagnosis Date Noted  . End-stage renal disease on hemodialysis ([place]) 09/20/2019  . HLD (hyperlipidemia) 09/20/2019  . Prostate cancer ([place]) 09/20/2019  .  Umbilical hernia 12/16/5100  . Acute pancreatitis 09/20/2019  . History of noncompliance with medical treatment 09/20/2019  . Diabetes mellitus type 2, uncontrolled, with complications ([place]) 58/52/7782  . Acute GI bleeding 09/20/2019  . Unspecified protein-calorie malnutrition ([place]) 08/04/2019  . Shoulder pain, bilateral 11/22/2018  . Impingement syndrome of right shoulder 11/08/2018  . Hypertensive emergency 10/16/2018  . Hypertensive encephalopathy 10/15/2018  . Allergy, unspecified, initial encounter 10/05/2018  . Anaphylactic shock, unspecified, initial encounter 10/05/2018  . Other fatigue 07/06/2018  . Headache 06/10/2018  . Left bundle-branch block, unspecified 02/14/2018  . Left bundle branch block (LBBB) determined by electrocardiography 02/13/2018  . Aortic stenosis 02/13/2018  . NSTEMI (non-ST elevated myocardial infarction) ([place]) 02/13/2018  . Constipation 12/08/2017  . Abdominal pain 12/06/2017  . Leukocytosis 12/06/2017  . Gout 12/06/2017  . Chronic pain 12/06/2017  . Acute on chronic cholecystitis s/p lap cholecystectomy 12/02/2017 12/02/2017  . Cardiac pacemaker in situ (Medtronic DDD) 12/02/2017  . Encounter for immunization 11/24/2017  . Abnormality of albumin 09/22/2017  . Secondary hyperparathyroidism of renal origin ([place]) 09/20/2017  . Unspecified Escherichia coli (E. coli) as the cause of diseases classified elsewhere 09/15/2017  . Chills (without fever) 09/06/2017  . Hypercalcemia 05/18/2017  . Anemia in chronic kidney disease 05/03/2017  . Fever, unspecified 04/30/2017  .  Iron deficiency anemia, unspecified 04/30/2017  . Other specified coagulation defects ([place]) 04/30/2017  . Pruritus, unspecified 04/30/2017  . Shortness of breath 04/30/2017  . Type 2 diabetes mellitus with diabetic peripheral angiopathy without gangrene ([place]) 04/30/2017  . Acute hypoxemic respiratory failure ([place]) 04/18/2017  . Heart block 04/17/2017  . Elevated troponin 04/17/2017  .  ESRD on hemodialysis ([place]) 04/12/2016  . H/O: stroke 04/11/2016  . Saccular aneurysm of infrarenal aorta with thrombosed dissection of abdominal aorta 04/11/2016  . Obesity (BMI 30-39.9) 04/11/2016  . Syncope 04/11/2016  . Hypothyroidism, unspecified 05/01/2015  . Prostate CA ([place]) 12/28/2014  . Chest pain 12/28/2014  . C. difficile colitis 10/04/2014  . Hyperkalemia 03/11/2013  . Anemia 03/11/2013  . DM (diabetes mellitus), type 2 with renal complications, diet controlled 03/11/2013  . HTN (hypertension) 03/11/2013  . Hyperlipidemia 03/11/2013  . ESRD on dialysis [facility]) 03/20/2011    Past Surgical History:  Procedure Laterality Date  . A/V FISTULAGRAM N/A 11/01/2018   Procedure: A/V FISTULAGRAM;  Surgeon: Serafina Mitchell, MD;  Location: [place] CV LAB;  Service: Cardiovascular;  Laterality: N/A;  . AV FISTULA PLACEMENT, RADIOCEPHALIC  21/30/8657   Left arm  . CAPD INSERTION N/A 03/06/2013   Procedure: LAPAROSCOPIC INSERTION CONTINUOUS AMBULATORY PERITONEAL DIALYSIS  (CAPD) CATHETER;  Surgeon: Edward Jolly, MD;  Location: [place];  Service: General;  Laterality: N/A;  . CATARACT EXTRACTION Right   . CHOLECYSTECTOMY  12/02/2017   LAPROSCOPIC  . CORONARY STENT INTERVENTION N/A 02/11/2018   Procedure: CORONARY STENT INTERVENTION;  Surgeon: Sherren Mocha, MD;  Location: [place] CV LAB;  Service: Cardiovascular;  Laterality: N/A;  . ESOPHAGOGASTRODUODENOSCOPY (EGD) WITH PROPOFOL N/A 10/18/2019   Procedure: ESOPHAGOGASTRODUODENOSCOPY (EGD) WITH PROPOFOL;  Surgeon: Arta Silence, MD;  Location: [facility];  Service: Endoscopy;  Laterality: N/A;  . EUS N/A 10/18/2019   Procedure: FULL UPPER ENDOSCOPIC ULTRASOUND (EUS) RADIAL;  Surgeon: Arta Silence, MD;  Location: [facility];  Service: Endoscopy;  Laterality: N/A;  . EYE SURGERY Left 1970s   for eye injury  . FISTULA SUPERFICIALIZATION Left 84/08/9627   Procedure: PLICATION OF LEFT RADIAL CEPHALIC ANEURYSM;  Surgeon:  Rosetta Posner, MD;  Location: [place];  Service: Vascular;  Laterality: Left;  . HERNIA REPAIR    . INSERTION PROSTATE RADIATION SEED    . LAPAROSCOPIC CHOLECYSTECTOMY SINGLE SITE WITH INTRAOPERATIVE CHOLANGIOGRAM N/A 12/02/2017   Procedure: LAPAROSCOPIC CHOLECYSTECTOMY;  Surgeon: Michael Boston, MD;  Location: [place];  Service: General;  Laterality: N/A;  . LEFT HEART CATH AND CORONARY ANGIOGRAPHY N/A 02/11/2018   Procedure: LEFT HEART CATH AND CORONARY ANGIOGRAPHY;  Surgeon: Sherren Mocha, MD;  Location: [place] CV LAB;  Service: Cardiovascular;  Laterality: N/A;  . PACEMAKER IMPLANT N/A 04/20/2017   Procedure: PACEMAKER IMPLANT;  Surgeon: Evans Lance, MD;  Location: [place] CV LAB;  Service: Cardiovascular;  Laterality: N/A;  . PERIPHERAL VASCULAR BALLOON ANGIOPLASTY  11/01/2018   Procedure: PERIPHERAL VASCULAR BALLOON ANGIOPLASTY;  Surgeon: Serafina Mitchell, MD;  Location: [place] CV LAB;  Service: Cardiovascular;;  Lt lower arm fistula   . UMBILICAL HERNIA REPAIR N/A 03/06/2013   Procedure: HERNIA REPAIR UMBILICAL WITH MESH;  Surgeon: Edward Jolly, MD;  Location: [facility] OR;  Service: General;  Laterality: N/A;       Family History  Problem Relation Age of Onset  . Cancer Mother   . Heart disease Mother   . Cancer Father     Social History   Tobacco Use  .  Smoking status: Never Smoker  . Smokeless tobacco: Never Used  Vaping Use  . Vaping Use: Never used  Substance Use Topics  . Alcohol use: No  . Drug use: No    Home Medications Prior to Admission medications   Medication Sig Start Date End Date Taking? Authorizing Provider  acetaminophen (TYLENOL) 500 MG tablet Take 1,000 mg by mouth every 6 (six) hours as needed for mild pain or headache.    [provider]  amLODipine (NORVASC) 5 MG tablet Take 5 mg by mouth daily. 08/18/19   [provider]  aspirin 81 MG chewable tablet Chew 1 tablet (81 mg total) by mouth daily. 02/14/18   Desiree Hane, MD  atorvastatin (LIPITOR) 40 MG tablet Take 40 mg by mouth every evening.  07/07/17   [provider]  Blood Glucose Calibration (TRUE METRIX LEVEL 2) Normal SOLN  09/14/19   [provider]  Blood Glucose Monitoring Suppl (TRUE METRIX METER) w/Device KIT  09/14/19   [provider]  calcitRIOL (ROCALTROL) 0.5 MCG capsule Take 2 capsules (1 mcg total) by mouth every Monday, Wednesday, and Friday with hemodialysis. 09/27/19   Allie Bossier, MD  calcium acetate (PHOSLO) 667 MG capsule Take 3 capsules (2,001 mg total) by mouth 3 (three) times daily with meals. 04/30/18   Nita Sells, MD  chlorhexidine (HIBICLENS) 4 % external liquid Apply topically daily as needed. Put 2 tablespoons in a basin of warm water and soak your foot for the next 7 days 01/24/20   Montine Circle, PA-C  cinacalcet [facility]) 90 MG tablet Take 90 mg by mouth every Monday, Wednesday, and Friday.     [provider]  cyclobenzaprine (FLEXERIL) 10 MG tablet  12/30/19   [provider]  Darbepoetin Alfa (ARANESP) 60 MCG/0.3ML SOSY injection Inject 0.3 mLs (60 mcg total) into the vein every Thursday with hemodialysis. Patient taking differently: Inject 60 mcg into the vein every 7 (seven) days. On Fridays 09/28/19   Allie Bossier, MD  ELDERBERRY PO Take 1 capsule by mouth daily. Gummie     [provider]  ferrous sulfate 325 (65 FE) MG EC tablet Take 325 mg by mouth daily with breakfast.     [provider]  lidocaine-prilocaine (EMLA) cream Apply 1 application topically as needed (for port access).  10/28/17   [provider]  loperamide (IMODIUM) 2 MG capsule Take 1 capsule (2 mg total) by mouth every 6 (six) hours as needed for diarrhea or loose stools. 10/18/18   Arrien, Jimmy Picket, MD  Methoxy PEG-Epoetin Beta (MIRCERA IJ) Mircera 10/11/19 10/09/20  [provider]  metoprolol tartrate (LOPRESSOR) 25 MG tablet Take 0.5 tablets (12.5 mg  total) by mouth 2 (two) times daily. 09/26/19   Allie Bossier, MD  nitroGLYCERIN (NITROSTAT) 0.4 MG SL tablet Place 1 tablet (0.4 mg total) under the tongue every 5 (five) minutes as needed for chest pain. Patient not taking: Reported on 01/30/2020 04/18/19   Leonie Man, MD  oxyCODONE-acetaminophen (PERCOCET) 5-325 MG tablet Take 1 tablet by mouth every 6 (six) hours as needed for severe pain. 01/02/20   Early, Arvilla Meres, MD  traMADol (ULTRAM) 50 MG tablet Take 1 tablet (50 mg total) by mouth 2 (two) times daily as needed for moderate pain. 01/30/20   Dagoberto Ligas, PA-C  TRUE METRIX BLOOD GLUCOSE TEST test strip SMARTSIG:Via Meter 09/14/19   [provider]  TRUEplus Lancets 30G MISC  09/14/19   [provider]  VELPHORO 500 MG chewable tablet Chew 500 mg by mouth 3 (three) times daily with meals.  01/28/18   [provider]  VITAMIN D PO Take by mouth. 01/05/20 01/03/21  [provider]    Allergies    Cardura [doxazosin mesylate] and Tape  Review of Systems   Review of Systems  All other systems reviewed and are negative.   Physical Exam Updated Vital Signs BP 102/75 (BP Location: Right Wrist)   Pulse 72   Temp 97.8 F (36.6 C) (Oral)   Resp 18   Ht 1.651 m (_0 )   Wt 104.3 kg   SpO2 94%   BMI 38.27 kg/m   Physical Exam Vitals and nursing note reviewed.  Constitutional:      Appearance: He is well-developed. He is ill-appearing.     Comments: Elderly, frail  HENT:     Head: Normocephalic and atraumatic.     Right Ear: External ear normal.     Left Ear: External ear normal.     Mouth/Throat:     Mouth: Mucous membranes are dry.  Eyes:     General: No scleral icterus.       Right eye: No discharge.        Left eye: No discharge.     Conjunctiva/sclera: Conjunctivae normal.  Neck:     Trachea: No tracheal deviation.  Cardiovascular:     Rate and Rhythm: Normal rate and regular rhythm.  Pulmonary:     Effort: Pulmonary effort  is normal. No respiratory distress.     Breath sounds: Normal breath sounds. No stridor. No wheezing or rales.  Abdominal:     General: Bowel sounds are normal. There is no distension.     Palpations: Abdomen is soft.     Tenderness: There is no abdominal tenderness. There is no guarding or rebound.  Musculoskeletal:        General: No tenderness.     Cervical back: Neck supple.     Comments: AV fistula left upper extremity, superficial skin ulcerations on the toes of the right foot, weak pulses in bilateral lower extremities  Skin:    General: Skin is warm and dry.     Findings: No rash.  Neurological:     Mental Status: He is alert.     GCS: GCS eye subscore is 4. GCS verbal subscore is 4. GCS motor subscore is 6.     Cranial Nerves: No cranial nerve deficit (no facial droop, extraocular movements intact, no slurred speech).     Sensory: No sensory deficit.     Motor: Weakness present. No abnormal muscle tone or seizure activity.     Coordination: Coordination normal.     Comments: Questionable increased weakness right greater than left, patient appears to have stronger grip strength in the left hand rather than the right, difficulty lifting either leg off the bed, sensation intact throughout     ED Results / Procedures / Treatments   Labs (all labs ordered are listed, but only abnormal results are displayed) Labs Reviewed  PROTIME-INR - Abnormal; Notable for the following components:      Result Value   Prothrombin Time 22.9 (*)    INR 2.1 (*)    All other components within normal limits  APTT - Abnormal; Notable for the following components:   aPTT 44 (*)    All other components within normal limits  CBC - Abnormal; Notable for the following components:   WBC 15.8 (*)  MCH 25.9 (*)    MCHC 29.5 (*)    RDW 21.3 (*)    All other components within normal limits  DIFFERENTIAL - Abnormal; Notable for the following components:   Neutro Abs 14.2 (*)    All other components  within normal limits  COMPREHENSIVE METABOLIC PANEL - Abnormal; Notable for the following components:   Chloride 89 (*)    BUN 33 (*)    Creatinine, Ser 7.59 (*)    Calcium 8.8 (*)    Total Protein 5.9 (*)    Albumin 3.1 (*)    AST 578 (*)    ALT 439 (*)    Total Bilirubin 1.5 (*)    GFR, Estimated 7 (*)    Anion gap 25 (*)    All other components within normal limits  CBG MONITORING, ED - Abnormal; Notable for the following components:   Glucose-Capillary 61 (*)    All other components within normal limits  RESP PANEL BY RT-PCR (FLU A&B, COVID) ARPGX2  CULTURE, BLOOD (ROUTINE X 2)  CULTURE, BLOOD (ROUTINE X 2)  ETHANOL    EKG EKG Interpretation  Date/Time:  Monday February 05 2020 12:15:46 EST Ventricular Rate:  70 PR Interval:    QRS Duration: 153 QT Interval:  480 QTC Calculation: 518 R Axis:   69 Text Interpretation: Electronic ventricular pacemaker Prolonged PR interval IVCD, consider atypical LBBB No significant change since last tracing Confirmed by Dorie Rank [phone]) on 02/25/2020 12:17:30 PM   Radiology DG Abdomen 1 View  Result Date: 02/25/2020 CLINICAL DATA:  Weakness, no bowel movement for 1 week EXAM: ABDOMEN - 1 VIEW COMPARISON:  September 20, 2019 FINDINGS: Brachytherapy seeds in the prostate. Surgical clips in the RIGHT upper quadrant compatible with prior cholecystectomy. Upper abdomen excluded from view.  Stomach with mild distension. Gas and stool in the rectum. Question of mild haustral thickening of the distal transverse colon. No signs of small bowel obstruction. Faint calcification overlies the LEFT abdomen more lateral than expected for ureteral calculus though ureteral calculus not excluded measuring 4 mm. Evidence of calcified atheromatous plaque in the abdominal aorta extending in the iliac vessels. On limited assessment no acute skeletal process. IMPRESSION: 1. Question of some haustral thickening of the distal transverse colon, findings could be seen in  the setting of colitis. No sign of bowel obstruction. 2. Potential mid LEFT ureteral calculus. 3. Upper abdomen excluded from view 4. Signs of aortoiliac atherosclerosis. Electronically Signed   By: Zetta Bills M.D.   On: 02/08/2020 13:06   CT HEAD WO CONTRAST  Result Date: 02/07/2020 CLINICAL DATA:  Decreased level of consciousness EXAM: CT HEAD WITHOUT CONTRAST TECHNIQUE: Contiguous axial images were obtained from the base of the skull through the vertex without intravenous contrast. COMPARISON:  10/17/2018 FINDINGS: Brain: There is atrophy and chronic small vessel disease changes. No acute intracranial abnormality. Specifically, no hemorrhage, hydrocephalus, mass lesion, acute infarction, or significant intracranial injury. Vascular: No hyperdense vessel or unexpected calcification. Skull: No acute calvarial abnormality. Sinuses/Orbits: No acute findings Other: None IMPRESSION: Atrophy, chronic microvascular disease. No acute intracranial abnormality. Electronically Signed   By: Rolm Baptise M.D.   On: 02/11/2020 14:05   DG Chest Portable 1 View  Result Date: 02/03/2020 CLINICAL DATA:  Weakness for 1 week, no bowel movements for 1 week EXAM: PORTABLE CHEST 1 VIEW COMPARISON:  March 29, 2019 FINDINGS: Trachea midline. Heart size remains enlarged. RIGHT-sided pacer device, 2 lead with power pack over the RIGHT chest with similar appearance.  Engorgement of central pulmonary vasculature. No lobar consolidation or pleural effusion. On limited assessment no acute skeletal process. IMPRESSION: Cardiomegaly with vascular congestion. Electronically Signed   By: Zetta Bills M.D.   On: 02/25/2020 13:03    Procedures .Critical Care Performed by: Dorie Rank, MD Authorized by: Dorie Rank, MD   Critical care provider statement:    Critical care time (minutes):  45   Critical care was time spent personally by me on the following activities:  Discussions with consultants, evaluation of patient's response  to treatment, examination of patient, ordering and performing treatments and interventions, ordering and review of laboratory studies, ordering and review of radiographic studies, pulse oximetry, re-evaluation of patient's condition, obtaining history from patient or surrogate and review of old charts   (including critical care time)  Medications Ordered in ED Medications  sodium chloride 0.9 % bolus 500 mL (has no administration in time range)  cefTRIAXone (ROCEPHIN) 2 g in sodium chloride 0.9 % 100 mL IVPB (has no administration in time range)    ED Course  I have reviewed the triage vital signs and the nursing notes.  Pertinent labs & imaging results that were available during my care of the patient were reviewed by me and considered in my medical decision making (see chart for details).  Clinical Course as of Feb 05 1540  Mon Feb 05, 2020  1411 CBC notable for elevated white blood cell count   [JK]  1411 Head CT without acute findings   [JK]  1412 Abdominal x-ray suggestive of possible ureteral stone as well as subtle findings to suggest colitis.  We will proceed with CT scan   [JK]  1538 Labs reviewed. Liver function tests notably elevated.   [JS]  9702 CT scan pending.   [JK]    Clinical Course User Index [JK] Dorie Rank, MD   MDM Rules/Calculators/A&P                          Pt presented with generalized weakness, decreased po intake.  Initial labs notable for elevated lfts.  Pt also with elevated wbc.  Concerning for possible hepatitis, , cholangitis.  Pt has had prior cholecystectomy.  Will proceed with CT scan.  Start empiric abx.  Anticipate will need admission.  Care turned over to oncoming team.  Final Clinical Impression(s) / ED Diagnoses Final diagnoses:  Elevated LFTs  Weakness      Dorie Rank, MD 02/18/2020 1542

## 2020-02-05 NOTE — ED Provider Notes (Addendum)
Signed out by Dr Tomi Bamberger, ammonia level pending, lfts very high - admit to medicine.   Ammonia level is high. Lactulose po. Lipase added to labs. Medicine consulted for admission.   U/s neg for cbd stone or increased biliary dilation. CT pending.  Note that egd 10/2019 showed changes c/w chronic pancreatitis, and pancreatic stones. 08/2019 pt with acute pancreatitis, ?duodenal diverticulum.   Recheck abd soft nt.   Discussed with hospitalist - will admit.  Nephrology consulted - discussed pt, will arrange dialysis for tomorrow.       Lajean Saver, MD 02/29/2020 2100

## 2020-02-05 NOTE — H&P (Signed)
History and Physical   Haik Mahoney WCH:[id number] DOB: [birth year] DOA: 02/19/2020  PCP: Seward Carol, MD   Patient coming from: Home  Chief Complaint: Altered mental status  HPI: Sean Best is a 86 y.o. male with medical history significant of history of GI bleed, status post cholecystectomy in 2019, history of pancreatitis, anemia, aortic stenosis, heart block status post pacemaker, diabetes, ESRD on HD MWF, gout, history of CVA, hyperlipidemia, hypertension, CAD, prostate cancer who presents with altered mental status.  Patient has had about a week or so of increasing weakness and fatigue and now confusion.  He has had some constipation and decreased p.o. intake according to himself and cooperated by his daughter.  She also states he has had a foot wound that she has been caring for the past 3 weeks.  He denies any fevers, cough, chest pain, shortness of breath, significant abdominal pain, nausea.  ED Course: Vital signs significant for initial soft blood pressure in the 35T to 61W systolic which improved to 431 systolic.  Lab work-up showed BMP with creatinine of 7.59 consistent with ESRD.  LFTs showed calcium of 8.8, protein 5.9, albumin of 3.1, AST 578, ALT 439.  CBC showed leukocytosis to 1590% neutrophils.  PT 24 PTT 44 INR 2.1.  EtOH negative.  Troponin flu and Covid negative.  Ammonia elevated 77, blood cultures and hepatic panel pending.  Chest x-ray showed some congestion, CT abdomen showed some haustral thickening, CT head was without acute abnormality, ultrasound of the abdomen showed patient was status post cholecystectomy with no acute changes.  CT abdomen pelvis showed no acute hepatic abnormalities but did note nonobstructing renal stones, renal cysts, bladder wall thickening, prostate radiation implantation seeds.  Patient received 500 cc bolus and started on dextrose infusion due to an episode of hypoglycemia to 33.  He was also given a dose of ceftriaxone.  Review of Systems:  As per HPI otherwise all other systems reviewed and are negative.  Past Medical History:  Diagnosis Date  . Anemia   . Arthritis    "knees" (04/11/2016)  . Blind left eye    S/P trauma  . CHF (congestive heart failure) ([place])   . Chronic total occlusion of artery of the extremities (HCC)    pt not aware of this  . Claudication ([place])   . ESRD on dialysis [facility])    "MWF; [name]" ((04/10/2016)  . ESRD on peritoneal dialysis [facility])    Started dialysis around April 2015 per son.  Has been doing peritoneal dialysis at home.     . Gout   . Hyperlipidemia   . Hypertension   . Overweight(278.02)   . Presence of permanent cardiac pacemaker    medtronic  . Prostate cancer ([place]) 1990s  . Shingles   . Stroke [facility]) ~1998   denies residual on 04/11/2016  . Type II diabetes mellitus (HCC)    diet controlled  . Umbilical hernia 5/40/0867    Past Surgical History:  Procedure Laterality Date  . A/V FISTULAGRAM N/A 11/01/2018   Procedure: A/V FISTULAGRAM;  Surgeon: Serafina Mitchell, MD;  Location: [place] CV [facility];  Service: Cardiovascular;  Laterality: N/A;  . AV FISTULA PLACEMENT, RADIOCEPHALIC  61/95/0932   Left arm  . CAPD INSERTION N/A 03/06/2013   Procedure: LAPAROSCOPIC INSERTION CONTINUOUS AMBULATORY PERITONEAL DIALYSIS  (CAPD) CATHETER;  Surgeon: Edward Jolly, MD;  Location: [place];  Service: General;  Laterality: N/A;  . CATARACT EXTRACTION Right   . CHOLECYSTECTOMY  12/02/2017  LAPROSCOPIC  . CORONARY STENT INTERVENTION N/A 02/11/2018   Procedure: CORONARY STENT INTERVENTION;  Surgeon: Sherren Mocha, MD;  Location: [place] CV [facility];  Service: Cardiovascular;  Laterality: N/A;  . ESOPHAGOGASTRODUODENOSCOPY (EGD) WITH PROPOFOL N/A 10/18/2019   Procedure: ESOPHAGOGASTRODUODENOSCOPY (EGD) WITH PROPOFOL;  Surgeon: Arta Silence, MD;  [facility];  Service: Endoscopy;  Laterality: N/A;  . EUS N/A 10/18/2019   Procedure: FULL UPPER ENDOSCOPIC ULTRASOUND (EUS)  RADIAL;  Surgeon: Arta Silence, MD;  Location: [facility];  Service: Endoscopy;  Laterality: N/A;  . EYE SURGERY Left 1970s   for eye injury  . FISTULA SUPERFICIALIZATION Left 95/07/2128   Procedure: PLICATION OF LEFT RADIAL CEPHALIC ANEURYSM;  Surgeon: Rosetta Posner, MD;  Location: [place];  Service: Vascular;  Laterality: Left;  . HERNIA REPAIR    . INSERTION PROSTATE RADIATION SEED    . LAPAROSCOPIC CHOLECYSTECTOMY SINGLE SITE WITH INTRAOPERATIVE CHOLANGIOGRAM N/A 12/02/2017   Procedure: LAPAROSCOPIC CHOLECYSTECTOMY;  Surgeon: Michael Boston, MD;  Location: [place];  Service: General;  Laterality: N/A;  . LEFT HEART CATH AND CORONARY ANGIOGRAPHY N/A 02/11/2018   Procedure: LEFT HEART CATH AND CORONARY ANGIOGRAPHY;  Surgeon: Sherren Mocha, MD;  Location: [place] [facility];  Service: Cardiovascular;  Laterality: N/A;  . PACEMAKER IMPLANT N/A 04/20/2017   Procedure: PACEMAKER IMPLANT;  Surgeon: Evans Lance, MD;  Location: [place] [facility];  Service: Cardiovascular;  Laterality: N/A;  . PERIPHERAL VASCULAR BALLOON ANGIOPLASTY  11/01/2018   Procedure: PERIPHERAL VASCULAR BALLOON ANGIOPLASTY;  Surgeon: Serafina Mitchell, MD;  Location: [place] [facility];  Service: Cardiovascular;;  Lt lower arm fistula   . UMBILICAL HERNIA REPAIR N/A 03/06/2013   Procedure: HERNIA REPAIR UMBILICAL WITH MESH;  Surgeon: Edward Jolly, MD;  Location: [place];  Service: General;  Laterality: N/A;    Social History  reports that he has never smoked. He has never used smokeless tobacco. He reports that he does not drink alcohol and does not use drugs.  Allergies  Allergen Reactions  . Cardura [Doxazosin Mesylate] Other (See Comments)    Hallucinations  . Tape Dermatitis    Paper tape ok to use     Family History  Problem Relation Age of Onset  . Cancer Mother   . Heart disease Mother   . Cancer Father   Reviewed on admission  Prior to Admission medications   Medication Sig Start Date End Date  Taking? Authorizing Provider  acetaminophen (TYLENOL) 500 MG tablet Take 1,000 mg by mouth every 6 (six) hours as needed for mild pain or headache.    [provider]  amLODipine (NORVASC) 5 MG tablet Take 5 mg by mouth daily. 08/18/19   [provider]  aspirin 81 MG chewable tablet Chew 1 tablet (81 mg total) by mouth daily. 02/14/18   Desiree Hane, MD  atorvastatin (LIPITOR) 40 MG tablet Take 40 mg by mouth every evening.  07/07/17   [provider]  Blood Glucose Calibration (TRUE METRIX LEVEL 2) Normal SOLN  09/14/19   [provider]  Blood Glucose Monitoring Suppl (TRUE METRIX METER) w/Device KIT  09/14/19   [provider]  calcitRIOL (ROCALTROL) 0.5 MCG capsule Take 2 capsules (1 mcg total) by mouth every Monday, Wednesday, and Friday with hemodialysis. 09/27/19   Allie Bossier, MD  calcium acetate (PHOSLO) 667 MG capsule Take 3 capsules (2,001 mg total) by mouth 3 (three) times daily with meals. 04/30/18   Nita Sells, MD  chlorhexidine (HIBICLENS) 4 %  external liquid Apply topically daily as needed. Put 2 tablespoons in a basin of warm water and soak your foot for the next 7 days 01/24/20   Browning, Robert, PA-C  cinacalcet (SENSIPAR) 90 MG tablet Take 90 mg by mouth every Monday, Wednesday, and Friday.     [provider]  cyclobenzaprine (FLEXERIL) 10 MG tablet  12/30/19   [provider]  Darbepoetin Alfa (ARANESP) 60 MCG/0.3ML SOSY injection Inject 0.3 mLs (60 mcg total) into the vein every Thursday with hemodialysis. Patient taking differently: Inject 60 mcg into the vein every 7 (seven) days. On Fridays 09/28/19   Woods, Curtis J, MD  ELDERBERRY PO Take 1 capsule by mouth daily. Gummie     [provider]  ferrous sulfate 325 (65 FE) MG EC tablet Take 325 mg by mouth daily with breakfast.     [provider]  lidocaine-prilocaine (EMLA) cream Apply 1 application topically as needed (for port  access).  10/28/17   [provider]  loperamide (IMODIUM) 2 MG capsule Take 1 capsule (2 mg total) by mouth every 6 (six) hours as needed for diarrhea or loose stools. 10/18/18   Arrien, Mauricio Mitsugi, MD  Methoxy PEG-Epoetin Beta (MIRCERA IJ) Mircera 10/11/19 10/09/20  [provider]  metoprolol tartrate (LOPRESSOR) 25 MG tablet Take 0.5 tablets (12.5 mg total) by mouth 2 (two) times daily. 09/26/19   Woods, Curtis J, MD  nitroGLYCERIN (NITROSTAT) 0.4 MG SL tablet Place 1 tablet (0.4 mg total) under the tongue every 5 (five) minutes as needed for chest pain. Patient not taking: Reported on 01/30/2020 04/18/19   Harding, David W, MD  oxyCODONE-acetaminophen (PERCOCET) 5-325 MG tablet Take 1 tablet by mouth every 6 (six) hours as needed for severe pain. 01/02/20   Early, Todd F, MD  traMADol (ULTRAM) 50 MG tablet Take 1 tablet (50 mg total) by mouth 2 (two) times daily as needed for moderate pain. 01/30/20   Eveland, Matthew, PA-C  TRUE METRIX BLOOD GLUCOSE TEST test strip SMARTSIG:Via Meter 09/14/19   [provider]  TRUEplus Lancets 30G MISC  09/14/19   [provider]  VELPHORO 500 MG chewable tablet Chew 500 mg by mouth 3 (three) times daily with meals.  01/28/18   [provider]  VITAMIN D PO Take by mouth. 01/05/20 01/03/21  [provider]    Physical Exam: Vitals:   02/25/2020 1945 02/29/2020 2000 02/26/2020 2015 02/17/2020 2200  BP: (!) 129/92 (!) 142/98 (!) 134/92 118/84  Pulse:    85  Resp: (!) 26 17 17 (!) 22  Temp:      TempSrc:      SpO2:    98%  Weight:      Height:       Physical Exam Constitutional:      General: He is not in acute distress.    Appearance: Normal appearance.  HENT:     Head: Normocephalic and atraumatic.     Mouth/Throat:     Mouth: Mucous membranes are moist.     Pharynx: Oropharynx is clear.  Eyes:     Extraocular Movements: Extraocular movements intact.     Pupils: Pupils are equal, round, and reactive  to light.  Cardiovascular:     Rate and Rhythm: Normal rate and regular rhythm.     Pulses: Normal pulses.     Heart sounds: Normal heart sounds.  Pulmonary:     Effort: Pulmonary effort is normal. No respiratory distress.     Breath   sounds: Normal breath sounds.  Abdominal:     General: Bowel sounds are normal. There is no distension.     Palpations: Abdomen is soft.     Tenderness: There is abdominal tenderness (Mild right upper quadrant).  Musculoskeletal:        General: No swelling or deformity.  Skin:    General: Skin is warm and dry.  Neurological:     General: No focal deficit present.     Comments: Alert and oriented to person and place, somewhat oriented to events but not oriented to year.  He is conversational and follows commands.     Labs on Admission: I have personally reviewed following labs and imaging studies  CBC: Recent Labs  Lab  1312  WBC 15.8*  NEUTROABS 14.2*  HGB 13.0  HCT 44.1  MCV 87.8  PLT 225    Basic Metabolic Panel: Recent Labs  Lab 02/14/2020 1312  NA 136  K 4.6  CL 89*  CO2 22  GLUCOSE 79  BUN 33*  CREATININE 7.59*  CALCIUM 8.8*    GFR: Estimated Creatinine Clearance: 8.1 mL/min (A) (by C-G formula based on SCr of 7.59 mg/dL (H)).  Liver Function Tests: Recent Labs  Lab 02/12/2020 1312  AST 578*  ALT 439*  ALKPHOS 126  BILITOT 1.5*  PROT 5.9*  ALBUMIN 3.1*    Urine analysis:    Component Value Date/Time   COLORURINE YELLOW 12/29/2014 0550   APPEARANCEUR CLOUDY (A) 12/29/2014 0550   LABSPEC 1.020 12/29/2014 0550   PHURINE 5.0 12/29/2014 0550   GLUCOSEU NEGATIVE 12/29/2014 0550   HGBUR SMALL (A) 12/29/2014 0550   BILIRUBINUR NEGATIVE 12/29/2014 0550   KETONESUR NEGATIVE 12/29/2014 0550   PROTEINUR 100 (A) 12/29/2014 0550   UROBILINOGEN 0.2 12/29/2014 0550   NITRITE NEGATIVE 12/29/2014 0550   LEUKOCYTESUR SMALL (A) 12/29/2014 0550    Radiological Exams on Admission: CT ABDOMEN PELVIS WO  CONTRAST  Result Date: 02/29/2020 CLINICAL DATA:  Abdominal distension with altered mental status and constipation. EXAM: CT ABDOMEN AND PELVIS WITHOUT CONTRAST TECHNIQUE: Multidetector CT imaging of the abdomen and pelvis was performed following the standard protocol without IV contrast. COMPARISON:  September 20, 2019 FINDINGS: Lower chest: No acute abnormality. Hepatobiliary: No focal liver abnormality is seen. Status post cholecystectomy. No biliary dilatation. Pancreas: Unremarkable. No pancreatic ductal dilatation or surrounding inflammatory changes. Spleen: Normal in size without focal abnormality. Adrenals/Urinary Tract: Adrenal glands are unremarkable. Kidneys are normal in size, without obstructing renal calculi or hydronephrosis. Numerous bilateral partially calcified and noncalcified cysts are seen within both kidneys kidneys. Bilateral 2 mm nonobstructing renal stones are seen. The urinary bladder is contracted and subsequently limited in evaluation. Diffuse thick uretero bladder wall thickening is suspected. This is seen on the prior study. Stomach/Bowel: Stomach is within normal limits. Appendix appears normal. No evidence of bowel wall thickening, distention, or inflammatory changes. Vascular/Lymphatic: Aortic atherosclerosis with stable 3.1 cm infrarenal aneurysmal dilatation. No enlarged abdominal or pelvic lymph nodes. Reproductive: Multiple prostate radiation implantation seeds are seen. Other: No abdominal wall hernia or abnormality. No abdominopelvic ascites. Musculoskeletal: Marked severity chronic and multilevel degenerative changes are seen throughout the lumbar spine. IMPRESSION: 1. Bilateral 2 mm nonobstructing renal stones. 2. Numerous bilateral partially calcified and noncalcified renal cysts. 3. Diffuse urinary bladder wall thickening which is seen on the prior study and may represent sequelae associated with cystitis. 4. Multiple prostate radiation implantation seeds. 5. Aortic  atherosclerosis. Aortic Atherosclerosis (ICD10-I70.0). Electronically Signed   By: Thaddeus  Houston   M.D.   On: 02/04/2020 21:09   DG Abdomen 1 View  Result Date: 02/01/2020 CLINICAL DATA:  Weakness, no bowel movement for 1 week EXAM: ABDOMEN - 1 VIEW COMPARISON:  September 20, 2019 FINDINGS: Brachytherapy seeds in the prostate. Surgical clips in the RIGHT upper quadrant compatible with prior cholecystectomy. Upper abdomen excluded from view.  Stomach with mild distension. Gas and stool in the rectum. Question of mild haustral thickening of the distal transverse colon. No signs of small bowel obstruction. Faint calcification overlies the LEFT abdomen more lateral than expected for ureteral calculus though ureteral calculus not excluded measuring 4 mm. Evidence of calcified atheromatous plaque in the abdominal aorta extending in the iliac vessels. On limited assessment no acute skeletal process. IMPRESSION: 1. Question of some haustral thickening of the distal transverse colon, findings could be seen in the setting of colitis. No sign of bowel obstruction. 2. Potential mid LEFT ureteral calculus. 3. Upper abdomen excluded from view 4. Signs of aortoiliac atherosclerosis. Electronically Signed   By: Geoffrey  Wile M.D.   On: 02/01/2020 13:06   CT HEAD WO CONTRAST  Result Date: 02/11/2020 CLINICAL DATA:  Decreased level of consciousness EXAM: CT HEAD WITHOUT CONTRAST TECHNIQUE: Contiguous axial images were obtained from the base of the skull through the vertex without intravenous contrast. COMPARISON:  10/17/2018 FINDINGS: Brain: There is atrophy and chronic small vessel disease changes. No acute intracranial abnormality. Specifically, no hemorrhage, hydrocephalus, mass lesion, acute infarction, or significant intracranial injury. Vascular: No hyperdense vessel or unexpected calcification. Skull: No acute calvarial abnormality. Sinuses/Orbits: No acute findings Other: None IMPRESSION: Atrophy, chronic microvascular  disease. No acute intracranial abnormality. Electronically Signed   By: Kevin  Dover M.D.   On: 02/16/2020 14:05   US Abdomen Limited  Result Date: 02/16/2020 CLINICAL DATA:  Elevated LFTs EXAM: ULTRASOUND ABDOMEN LIMITED RIGHT UPPER QUADRANT COMPARISON:  None. FINDINGS: Gallbladder: The patient is status post cholecystectomy. No biliary ductal dilation. Common bile duct: Diameter: 1 cm Liver: No focal lesion identified. Within normal limits in parenchymal echogenicity. Portal vein is patent on color Doppler imaging with normal direction of blood flow towards the liver. Other: Small amount of perihepatic ascites is seen. IMPRESSION: Status post cholecystectomy. Mild prominence of the common bile duct which may be due to postsurgical dilation, no definite shadowing stones are seen. Small amount of perihepatic ascites. Electronically Signed   By: Bindu  Avutu M.D.   On: 02/26/2020 19:53   DG Chest Portable 1 View  Result Date: 02/08/2020 CLINICAL DATA:  Weakness for 1 week, no bowel movements for 1 week EXAM: PORTABLE CHEST 1 VIEW COMPARISON:  March 29, 2019 FINDINGS: Trachea midline. Heart size remains enlarged. RIGHT-sided pacer device, 2 lead with power pack over the RIGHT chest with similar appearance. Engorgement of central pulmonary vasculature. No lobar consolidation or pleural effusion. On limited assessment no acute skeletal process. IMPRESSION: Cardiomegaly with vascular congestion. Electronically Signed   By: Geoffrey  Wile M.D.   On: 02/10/2020 13:03    EKG: Unable to be reviewed due to technical difficulties with EMR.  Assessment/Plan Principal Problem:   Acute encephalopathy Active Problems:   Anemia   HTN (hypertension)   Hyperlipidemia   Heart block   Cardiac pacemaker in situ (Medtronic DDD)   Leukocytosis   End-stage renal disease on hemodialysis (HCC)   HLD (hyperlipidemia)   Diabetes mellitus type 2, uncontrolled, with complications (HCC)   Anemia in chronic kidney  disease   Hypothyroidism, unspecified   Hepatitis  Acute encephalopathy >   Ammonia noted to be elevated in the setting of hepatic pattern LFT elevation. > Has has some constipation, given dose of lactulose in the ED > Infectious work-up unrevealing thus far - We will continue lactulose as infectious/hepatic work-up is ongoing  Elevated LFTs Acute hepatitis History of pancreatitis Status post cholecystectomy in 2019 > Hepatic pattern with AST and ALT only elevated at 578 and 439 respectively. > Abdominal ultrasound showed status post cholecystectomy with mild prominence of common bile duct likely postsurgical and small perihepatic ascites > CT abdomen showed no focal liver abnormality > Acute hepatitis panel pending - Continue ceftriaxone for now - Follow-up hepatitis panel - Continue empiric ceftriaxone - Trend LFTs - Follow-up urine studies and blood cultures  Diabetes > Episode of hypoglycemia here to 33 requiring D10 drip for now -Continue D10 drip until glucose stabilizes  Leukocytosis ?  UTI > Does not make significant urine, deflation bladder noted on imaging - Check urine studies  Foot wound > Present for several weeks per daughter - Continue wound care  Anemia > Hemoglobin stable at 11.2 -Monitor CBC  Hypertension Aortic stenosis Heart block status post pacemaker - Continue home metoprolol  ESRD on HD MWF > Nephrology to be consulted by EDP - Likely HD in the morning - Continue home calcitriol, cinacalcet, PhosLo  Hyperlipidemia History of CVA  - Continue home atorvastatin and aspirin  Infrarenal aortic aneurysm > CT showed stable 3.1 cm infrarenal aneurysmal dilatation  Hx Prostate cancer - Implantable seeds in place   DVT prophylaxis: Heparin Code Status:   Full  Family Communication:  Discussed with patient's daughter, Holly, by phone Disposition Plan:   Patient is from:  Home  Anticipated DC to:  Pending clinical course  Anticipated DC  date:  Pending clinical course  Anticipated DC barriers: None  Consults called:  EDP states he will consult nephrology.   Admission status:  Inpatient, telemetry   Severity of Illness: The appropriate patient status for this patient is INPATIENT. Inpatient status is judged to be reasonable and necessary in order to provide the required intensity of service to ensure the patient's safety. The patient's presenting symptoms, physical exam findings, and initial radiographic and laboratory data in the context of their chronic comorbidities is felt to place them at high risk for further clinical deterioration. Furthermore, it is not anticipated that the patient will be medically stable for discharge from the hospital within 2 midnights of admission. The following factors support the patient status of inpatient.   " The patient's presenting symptoms include altered mental status. " The worrisome physical exam findings include abdominal tenderness mild right upper quadrant. " The initial radiographic and laboratory data are worrisome because of elevated LFTs AST 538, ALT 439.  WBC 15.  PT 22, PTT 44, INR 2.1.  Ammonia 77. " The chronic co-morbidities include GI bleed, pancreatitis, anemia, aortic stenosis, heart block status post pacemaker, diabetes, ESRD, gout, history of CVA, hyperlipidemia, hypertension, CAD, prostate cancer..   * I certify that at the point of admission it is my clinical judgment that the patient will require inpatient hospital care spanning beyond 2 midnights from the point of admission due to high intensity of service, high risk for further deterioration and high frequency of surveillance required.*    B  MD [facility]  How to contact the [facility] Attending or Consulting provider 7A - 7P or covering provider during after hours 7P -7A, for this patient?   1. Check the care team in [facility] and   look for a) attending/consulting [facility] provider listed and b) the [facility] team  listed 2. Log into [email or web address] and use [facility]'s universal password to access. If you do not have the password, please contact the hospital operator. 3. Locate the [facility] provider you are looking for under [facility] and page to a number that you can be directly reached. 4. If you still have difficulty reaching the provider, please page the [facility] (Director on Call) for the Hospitalists listed on amion for assistance.  02/10/2020, 10:19 PM

## 2020-02-06 LAB — CBC
HCT: 40.5 % (ref 39.0–52.0)
HCT: 38.6 % — ABNORMAL LOW (ref 39.0–52.0)
Hemoglobin: 12.8 g/dL — ABNORMAL LOW (ref 13.0–17.0)
Hemoglobin: 11.8 g/dL — ABNORMAL LOW (ref 13.0–17.0)
MCH: 26.8 pg (ref 26.0–34.0)
MCH: 25.8 pg — ABNORMAL LOW (ref 26.0–34.0)
MCHC: 31.6 g/dL (ref 30.0–36.0)
MCHC: 30.6 g/dL (ref 30.0–36.0)
MCV: 84.9 fL (ref 80.0–100.0)
MCV: 84.3 fL (ref 80.0–100.0)
Platelets: 171 10*3/uL (ref 150–400)
Platelets: 164 10*3/uL (ref 150–400)
RBC: 4.77 MIL/uL (ref 4.22–5.81)
RBC: 4.58 MIL/uL (ref 4.22–5.81)
RDW: 21.2 % — ABNORMAL HIGH (ref 11.5–15.5)
RDW: 21.1 % — ABNORMAL HIGH (ref 11.5–15.5)
WBC: 16.1 10*3/uL — ABNORMAL HIGH (ref 4.0–10.5)
WBC: 16.2 10*3/uL — ABNORMAL HIGH (ref 4.0–10.5)
nRBC: 0.1 % (ref 0.0–0.2)
nRBC: 0.1 % (ref 0.0–0.2)

## 2020-02-06 LAB — COMPREHENSIVE METABOLIC PANEL
ALT: 860 U/L — ABNORMAL HIGH (ref 0–44)
AST: 1382 U/L — ABNORMAL HIGH (ref 15–41)
Albumin: 2.9 g/dL — ABNORMAL LOW (ref 3.5–5.0)
Alkaline Phosphatase: 132 U/L — ABNORMAL HIGH (ref 38–126)
Anion gap: 27 — ABNORMAL HIGH (ref 5–15)
BUN: 38 mg/dL — ABNORMAL HIGH (ref 8–23)
CO2: 21 mmol/L — ABNORMAL LOW (ref 22–32)
Calcium: 8.8 mg/dL — ABNORMAL LOW (ref 8.9–10.3)
Chloride: 89 mmol/L — ABNORMAL LOW (ref 98–111)
Creatinine, Ser: 8.57 mg/dL — ABNORMAL HIGH (ref 0.61–1.24)
GFR, Estimated: 6 mL/min — ABNORMAL LOW (ref >60)
Glucose, Bld: 111 mg/dL — ABNORMAL HIGH (ref 70–99)
Potassium: 4.7 mmol/L (ref 3.5–5.1)
Sodium: 137 mmol/L (ref 135–145)
Total Bilirubin: 1.6 mg/dL — ABNORMAL HIGH (ref 0.3–1.2)
Total Protein: 5.9 g/dL — ABNORMAL LOW (ref 6.5–8.1)

## 2020-02-06 LAB — GLUCOSE, CAPILLARY
Glucose-Capillary: 111 mg/dL — ABNORMAL HIGH (ref 70–99)
Glucose-Capillary: 98 mg/dL (ref 70–99)
Glucose-Capillary: 85 mg/dL (ref 70–99)

## 2020-02-06 LAB — HEPATITIS PANEL, ACUTE
HCV Ab: NONREACTIVE
Hep A IgM: NONREACTIVE
Hep B C IgM: NONREACTIVE
Hepatitis B Surface Ag: NONREACTIVE

## 2020-02-06 LAB — AMMONIA: Ammonia: 49 umol/L — ABNORMAL HIGH (ref 9–35)

## 2020-02-06 LAB — PROTIME-INR
INR: 2.6 — ABNORMAL HIGH (ref 0.8–1.2)
Prothrombin Time: 26.7 seconds — ABNORMAL HIGH (ref 11.4–15.2)

## 2020-02-06 LAB — PROCALCITONIN: Procalcitonin: 1.46 ng/mL

## 2020-02-06 LAB — CK: Total CK: 182 U/L (ref 49–397)

## 2020-02-06 MED ORDER — HEPARIN SODIUM (PORCINE) 1000 UNIT/ML DIALYSIS: 1000.0000 [IU] | INTRAMUSCULAR | Status: DC | PRN

## 2020-02-06 MED ORDER — PENTAFLUOROPROP-TETRAFLUOROETH EX AERO: 1.0000 "application " | INHALATION_SPRAY | CUTANEOUS | Status: DC | PRN

## 2020-02-06 MED ORDER — VANCOMYCIN HCL IN DEXTROSE 1-5 GM/200ML-% IV SOLN
1000.0000 mg | INTRAVENOUS | Status: DC
  Filled 2020-02-06: qty 200

## 2020-02-06 MED ORDER — LACTULOSE ENEMA
300.0000 mL | Freq: Two times a day (BID) | ORAL | Status: DC
  Administered 2020-02-06: 300 mL via RECTAL
  Filled 2020-02-06 (×3): qty 300

## 2020-02-06 MED ORDER — SODIUM CHLORIDE 0.9 % IV SOLN: 100.0000 mL | INTRAVENOUS | Status: DC | PRN

## 2020-02-06 MED ORDER — ALTEPLASE 2 MG IJ SOLR: 2.0000 mg | Freq: Once | INTRAMUSCULAR | Status: DC | PRN

## 2020-02-06 MED ORDER — VANCOMYCIN HCL 2000 MG/400ML IV SOLN
2000.0000 mg | Freq: Once | INTRAVENOUS | Status: AC
  Administered 2020-02-06: 2000 mg via INTRAVENOUS
  Filled 2020-02-06: qty 400

## 2020-02-06 MED ORDER — VITAMIN K1 10 MG/ML IJ SOLN
10.0000 mg | Freq: Once | INTRAVENOUS | Status: AC
  Administered 2020-02-06: 10 mg via INTRAVENOUS
  Filled 2020-02-06: qty 1

## 2020-02-06 MED ORDER — LIDOCAINE-PRILOCAINE 2.5-2.5 % EX CREA: 1.0000 "application " | TOPICAL_CREAM | CUTANEOUS | Status: DC | PRN

## 2020-02-06 MED ORDER — LIDOCAINE HCL (PF) 1 % IJ SOLN: 5.0000 mL | INTRAMUSCULAR | Status: DC | PRN

## 2020-02-06 NOTE — Consult Note (Addendum)
Hospital Consult    Reason for Consult:  R foot wounds Requesting Physician:  [facility] MRN #:  [id number]  History of Present Illness: This is a 86 y.o. male with past medical history significant for type 2 diabetes mellitus, hypertension, hyperlipidemia, CHF, and ESRD on hemodialysis.  He was last seen in the office on 01/30/20.  At that time he had a 3-week history of blistering wounds on all toes of right foot.  Patient's daughter had been cleansing these wounds daily and wounds seem to be improving.  He was offered angiography however preferred to continue wound care unless wounds worsened.  He was admitted to the hospital with acute encephalopathy.  He has been started on Rocephin and vancomycin.  Daughter is not present and patient is currently confused not oriented to place or time.  Patient also noted to have a coagulopathy with INR of 2.6.  Past Medical History:  Diagnosis Date  . Anemia   . Arthritis    "knees" (04/11/2016)  . Blind left eye    S/P trauma  . CHF (congestive heart failure) ([place])   . Chronic total occlusion of artery of the extremities (HCC)    pt not aware of this  . Claudication ([place])   . ESRD on dialysis [facility])    "MWF; [name]" ((04/10/2016)  . ESRD on peritoneal dialysis [facility])    Started dialysis around April 2015 per son.  Has been doing peritoneal dialysis at home.     . Gout   . Hyperlipidemia   . Hypertension   . Overweight(278.02)   . Presence of permanent cardiac pacemaker    medtronic  . Prostate cancer ([place]) 1990s  . Shingles   . Stroke [facility]) ~1998   denies residual on 04/11/2016  . Type II diabetes mellitus (HCC)    diet controlled  . Umbilical hernia 1/69/6789    Past Surgical History:  Procedure Laterality Date  . A/V FISTULAGRAM N/A 11/01/2018   Procedure: A/V FISTULAGRAM;  Surgeon: Serafina Mitchell, MD;  Location: [place] [facility];  Service: Cardiovascular;  Laterality: N/A;  . AV FISTULA PLACEMENT, RADIOCEPHALIC  38/11/1749   Left  arm  . CAPD INSERTION N/A 03/06/2013   Procedure: LAPAROSCOPIC INSERTION CONTINUOUS AMBULATORY PERITONEAL DIALYSIS  (CAPD) CATHETER;  Surgeon: Edward Jolly, MD;  Location: [place];  Service: General;  Laterality: N/A;  . CATARACT EXTRACTION Right   . CHOLECYSTECTOMY  12/02/2017   LAPROSCOPIC  . CORONARY STENT INTERVENTION N/A 02/11/2018   Procedure: CORONARY STENT INTERVENTION;  Surgeon: Sherren Mocha, MD;  Location: [place] [facility];  Service: Cardiovascular;  Laterality: N/A;  . ESOPHAGOGASTRODUODENOSCOPY (EGD) WITH PROPOFOL N/A 10/18/2019   Procedure: ESOPHAGOGASTRODUODENOSCOPY (EGD) WITH PROPOFOL;  Surgeon: Arta Silence, MD;  [facility];  Service: Endoscopy;  Laterality: N/A;  . EUS N/A 10/18/2019   Procedure: FULL UPPER ENDOSCOPIC ULTRASOUND (EUS) RADIAL;  Surgeon: Arta Silence, MD;  [facility];  Service: Endoscopy;  Laterality: N/A;  . EYE SURGERY Left 1970s   for eye injury  . FISTULA SUPERFICIALIZATION Left 04/06/8525   Procedure: PLICATION OF LEFT RADIAL CEPHALIC ANEURYSM;  Surgeon: Rosetta Posner, MD;  Location: [place];  Service: Vascular;  Laterality: Left;  . HERNIA REPAIR    . INSERTION PROSTATE RADIATION SEED    . LAPAROSCOPIC CHOLECYSTECTOMY SINGLE SITE WITH INTRAOPERATIVE CHOLANGIOGRAM N/A 12/02/2017   Procedure: LAPAROSCOPIC CHOLECYSTECTOMY;  Surgeon: Michael Boston, MD;  Location: [place];  Service: General;  Laterality: N/A;  . LEFT HEART CATH  AND CORONARY ANGIOGRAPHY N/A 02/11/2018   Procedure: LEFT HEART CATH AND CORONARY ANGIOGRAPHY;  Surgeon: Sherren Mocha, MD;  Location: [place] [facility];  Service: Cardiovascular;  Laterality: N/A;  . PACEMAKER IMPLANT N/A 04/20/2017   Procedure: PACEMAKER IMPLANT;  Surgeon: Evans Lance, MD;  Location: [place] [facility];  Service: Cardiovascular;  Laterality: N/A;  . PERIPHERAL VASCULAR BALLOON ANGIOPLASTY  11/01/2018   Procedure: PERIPHERAL VASCULAR BALLOON ANGIOPLASTY;  Surgeon: Serafina Mitchell,  MD;  Location: [place] [facility];  Service: Cardiovascular;;  Lt lower arm fistula   . UMBILICAL HERNIA REPAIR N/A 03/06/2013   Procedure: HERNIA REPAIR UMBILICAL WITH MESH;  Surgeon: Edward Jolly, MD;  [facility];  Service: General;  Laterality: N/A;    Allergies  Allergen Reactions  . Cardura [Doxazosin Mesylate] Other (See Comments)    Hallucinations  . Tape Dermatitis    Paper tape ok to use     Prior to Admission medications   Medication Sig Start Date End Date Taking? Authorizing Provider  acetaminophen (TYLENOL) 500 MG tablet Take 1,000 mg by mouth every 6 (six) hours as needed for mild pain or headache.   Yes [provider]  amLODipine (NORVASC) 5 MG tablet Take 5 mg by mouth daily. 08/18/19  Yes [provider]  aspirin 81 MG chewable tablet Chew 1 tablet (81 mg total) by mouth daily. 02/14/18  Yes Oretha Milch D, MD  atorvastatin (LIPITOR) 40 MG tablet Take 40 mg by mouth every evening.  07/07/17  Yes [provider]  calcium acetate (PHOSLO) 667 MG capsule Take 3 capsules (2,001 mg total) by mouth 3 (three) times daily with meals. 04/30/18  Yes Nita Sells, MD  chlorhexidine (HIBICLENS) 4 % external liquid Apply topically daily as needed. Put 2 tablespoons in a basin of warm water and soak your foot for the next 7 days Patient taking differently: Apply 1 application topically daily as needed (foot blister). Put 2 tablespoons in a basin of warm water and soak your foot for the next 7 days 01/24/20  Yes Montine Circle, PA-C  cinacalcet [facility]) 90 MG tablet Take 90 mg by mouth every Monday, Wednesday, and Friday.    Yes [provider]  ELDERBERRY PO Take 1 capsule by mouth daily. Gummie    Yes [provider]  ferrous sulfate 325 (65 FE) MG EC tablet Take 325 mg by mouth daily with breakfast.    Yes [provider]  lidocaine-prilocaine (EMLA) cream Apply 1 application topically as needed (for port  access).  10/28/17  Yes [provider]  metoprolol tartrate (LOPRESSOR) 25 MG tablet Take 0.5 tablets (12.5 mg total) by mouth 2 (two) times daily. 09/26/19  Yes Allie Bossier, MD  nitroGLYCERIN (NITROSTAT) 0.4 MG SL tablet Place 1 tablet (0.4 mg total) under the tongue every 5 (five) minutes as needed for chest pain. 04/18/19  Yes Leonie Man, MD  traMADol (ULTRAM) 50 MG tablet Take 1 tablet (50 mg total) by mouth 2 (two) times daily as needed for moderate pain. 01/30/20  Yes Dagoberto Ligas, PA-C  VELPHORO 500 MG chewable tablet Chew 500 mg by mouth 3 (three) times daily with meals.  01/28/18  Yes [provider]  VITAMIN D PO Take 1 capsule by mouth daily.  01/05/20 01/03/21 Yes [provider]  Blood Glucose Calibration (TRUE METRIX LEVEL 2) Normal SOLN  09/14/19   [provider]  Blood Glucose Monitoring Suppl (TRUE METRIX METER) w/Device KIT  09/14/19   [provider]  calcitRIOL (ROCALTROL) 0.5 MCG capsule Take 2 capsules (1 mcg total) by mouth every Monday, Wednesday, and Friday with hemodialysis. 09/27/19   Allie Bossier, MD  Darbepoetin Alfa (ARANESP) 60 MCG/0.3ML SOSY injection Inject 0.3 mLs (60 mcg total) into the vein every Thursday with hemodialysis. Patient taking differently: Inject 60 mcg into the vein every 7 (seven) days. On Fridays 09/28/19   Allie Bossier, MD  Methoxy PEG-Epoetin Beta (MIRCERA IJ) Mircera 10/11/19 10/09/20  [provider]  oxyCODONE-acetaminophen (PERCOCET) 5-325 MG tablet Take 1 tablet by mouth every 6 (six) hours as needed for severe pain. Patient not taking: Reported on 02/06/2020 01/02/20   Rosetta Posner, MD  TRUE METRIX BLOOD GLUCOSE TEST test strip SMARTSIG:Via Meter 09/14/19   [provider]  TRUEplus Lancets 30G MISC  09/14/19   [provider]    Social History   Socioeconomic History  . Marital status: Widowed    Spouse name: Not on file  . Number of children: 2  . Years  of education: Not on file  . Highest education level: Not on file  Occupational History  . Occupation: Retired  Tobacco Use  . Smoking status: Never Smoker  . Smokeless tobacco: Never Used  Vaping Use  . Vaping Use: Never used  Substance and Sexual Activity  . Alcohol use: No  . Drug use: No  . Sexual activity: Not Currently  Other Topics Concern  . Not on file  Social History Narrative   Patient lives with his daughter.   Social Determinants of Health   Financial Resource Strain:   . Difficulty of Paying Living Expenses: Not on file  Food Insecurity:   . Worried About Charity fundraiser in the Last Year: Not on file  . Ran Out of Food in the Last Year: Not on file  Transportation Needs:   . Lack of Transportation (Medical): Not on file  . Lack of Transportation (Non-Medical): Not on file  Physical Activity:   . Days of Exercise per Week: Not on file  . Minutes of Exercise per Session: Not on file  Stress:   . Feeling of Stress : Not on file  Social Connections:   . Frequency of Communication with Friends and Family: Not on file  . Frequency of Social Gatherings with Friends and Family: Not on file  . Attends Religious Services: Not on file  . Active Member of Clubs or Organizations: Not on file  . Attends Archivist Meetings: Not on file  . Marital Status: Not on file  Intimate Partner Violence:   . Fear of Current or Ex-Partner: Not on file  . Emotionally Abused: Not on file  . Physically Abused: Not on file  . Sexually Abused: Not on file     Family History  Problem Relation Age of Onset  . Cancer Mother   . Heart disease Mother   . Cancer Father     ROS: Otherwise negative unless mentioned in HPI  Physical Examination  Vitals:   02/06/20 0630 02/06/20 0939  BP: 107/88 123/88  Pulse: 78 85  Resp:  19  Temp: 98.1 F (36.7 C) 98.2 F (36.8 C)  SpO2: 95% 90%   Body mass index is 38.27 kg/m.  General:  WDWN in NAD Gait: Not  observed HENT: WNL, normocephalic Pulmonary: normal non-labored breathing Cardiac: regular Abdomen:  soft, NT/ND, no masses Skin: without rashes Vascular Exam/Pulses: no palpable pedal pulses Extremities: R  foot toe wounds dry; picture below taken yesterday by admitting physician Musculoskeletal: no muscle wasting or atrophy  Neurologic: A&O X 3;  No focal weakness or paresthesias are detected; speech is fluent/normal Psychiatric:  The pt has Normal affect. Lymph:  Unremarkable      CBC    Component Value Date/Time   WBC 16.1 (H) 02/06/2020 0800   RBC 4.77 02/06/2020 0800   HGB 12.8 (L) 02/06/2020 0800   HCT 40.5 02/06/2020 0800   PLT 171 02/06/2020 0800   MCV 84.9 02/06/2020 0800   MCH 26.8 02/06/2020 0800   MCHC 31.6 02/06/2020 0800   RDW 21.2 (H) 02/06/2020 0800   LYMPHSABS 0.8 02/18/2020 1312   MONOABS 0.8 02/09/2020 1312   EOSABS 0.0 02/22/2020 1312   BASOSABS 0.0 02/07/2020 1312    BMET    Component Value Date/Time   NA 137 02/06/2020 0800   K 4.7 02/06/2020 0800   CL 89 (L) 02/06/2020 0800   CO2 21 (L) 02/06/2020 0800   GLUCOSE 111 (H) 02/06/2020 0800   BUN 38 (H) 02/06/2020 0800   CREATININE 8.57 (H) 02/06/2020 0800   CALCIUM 8.8 (L) 02/06/2020 0800   GFRNONAA 6 (L) 02/06/2020 0800   GFRAA 8 (L) 09/25/2019 0421    COAGS: Lab Results  Component Value Date   INR 2.6 (H) 02/06/2020   INR 2.1 (H) 02/28/2020   INR 1.10 02/10/2018     ASSESSMENT/PLAN: This is a 86 y.o. male with wounds of R foot involving all toes  -No significant change or improvement noted in wounds compared to my exam last week -I had offered the patient and the patient's daughter angiography to evaluate and potentially improve circulation of RLE to assist wound healing.  At that time, patient and daughter prefer to continue wound care and consider angiography if wounds worsened -On-call vascular surgeon Dr. Donnetta Hutching will evaluate the patient later today however we could consider  proceeding with angiography when patient is more medically stable and coagulopathy is corrected -Agree with IV antibiotics and continued wound care involving Betadine paint and daily cleansing of right foot for now   Dagoberto Ligas PA-C [facility] [phone]  I have examined the patient, reviewed and agree with above.  Resolving blistering on all the toes of the dorsum of his right foot.  Not tender.  No surrounding erythema.  This does not appear to be source for any sepsis or other systemic illness.  He does have palpable femoral pulses but absent popliteal and distal pulses bilaterally.  Certainly at marginal flow for healing these blistering.  Family wishes continued observation and wound care.  Will defer arteriography certainly until he is more stabilized.  Curt Jews, MD 02/06/2020 11:43 AM

## 2020-02-06 NOTE — Progress Notes (Incomplete Revision)
RN attempted in and out cath and was unable to obtain any urine to send for patient urinalysis. RN will continue to monitor this patient.

## 2020-02-06 NOTE — Progress Notes (Signed)
[facility] ([facility])  Hospital Liaison: RN note         Notified by TOC manager of patient/family request for [facility] Palliative services at home after discharge.                  [facility] Palliative team will follow up with patient after discharge.         Please call with any hospice or palliative related questions.         Thank you for this referral.         Mary Anne Robertson, RN, CCM  [facility] Hospital Liaison (listed on AMION under Hospice[facility])    [phone]   

## 2020-02-06 NOTE — Progress Notes (Addendum)
RN attempted in and out cath and was unable to obtain any urine to send for patient urinalysis. RN will continue to monitor this patient.

## 2020-02-06 NOTE — Progress Notes (Signed)
Pharmacy Antibiotic Note  Sean Best is a 86 y.o. male admitted on 02/04/2020 with foot wound infection.  Patient has had foot wound for last 3-4 weeks and completed 2 courses of outpatient Doxycycline with little improvement.  Pharmacy has been consulted for Vancomycin dosing.  Patient is also on Rocephin per MD orders.  Pt has hx ESRD on HD MWF.  Plan: Vancomycin 2gm IV x 1 now Vancomycin 1gm IV with HD MWF Follow-up HD orders and adjust Vancomycin schedule if needed. Vancomycin target pre-HD level 15-25 mcg/ml  Height: 5\' 5"  (165.1 cm) Weight: 104.3 kg (230 lb) IBW/kg (Calculated) : 61.5  Temp (24hrs), Avg:98 F (36.7 C), Min:97.8 F (36.6 C), Max:98.2 F (36.8 C)  Recent Labs  Lab 02/26/2020 1312 02/06/20 0800  WBC 15.8* 16.1*  CREATININE 7.59* 8.57*    Estimated Creatinine Clearance: 7.1 mL/min (A) (by C-G formula based on SCr of 8.57 mg/dL (H)).    Allergies  Allergen Reactions  . Cardura [Doxazosin Mesylate] Other (See Comments)    Hallucinations  . Tape Dermatitis    Paper tape ok to use     Antimicrobials this admission: Vanc 12/7 >> Rocephin 12/6 >>  Dose adjustments this admission:   Microbiology results:  Thank you for allowing pharmacy to be a part of this patient's care.  [employer], Pharm.D., BCPS Clinical Pharmacist Clinical phone for 02/06/2020 from 8:30-4:00 is [phone].  **Pharmacist phone directory can be found on [email or web address] listed under [place].  02/06/2020 10:40 AM

## 2020-02-06 NOTE — TOC Initial Note (Signed)
Transition of Care [facility]) - Initial/Assessment Note    Patient Details  Name: Sean Best MRN: [id number] Date of Birth: [birth year]  Transition of Care [facility] CM/SW Contact:    Bartholomew Crews, RN Phone Number: [phone] 02/06/2020, 10:47 AM  Clinical Narrative:                  Spoke with patient's daughter, Danette, at the bedside. Patient not able to participate in conversation at this time. She states that she and patient live in the same home.   [facility] on MWF 10:36 am, but usually arrives around 10 am. Danette or another family member provides transportation for dialysis. Discussed option for [facility] [place] if needed.  Patient does have a cane, but could benefit from walker, 3N1, and wheelchair. Danette has worked with [employer] who were able to install a ramp in the back of house, grab bars in the bathroom, a raised toilet seat, and replacement of a couple windows.   Patient participates in [facility] community case management program aging gracefully. Danette states that a nurse comes out to do an assessment, and recently OT visited and determined that patient is not safe to get in/out of tub. Danette will work with [employer] to replace tub with walk in shower.   No home health or home care services. Danette has been looking at options for home care aides.   Danette recently asked PCP, Dr. Delfina Redwood, for hospice referral. Hospice agency of choice is [facility] stating that they took care of her mom.   Danette also asked about possible rehabilitation in a skilled nursing facility. Discussed briefly process and differences between SNF and LTC. Discussed need for PT/OT evaluations to help determine best disposition.   Danette is wanting to find out what is going on with her dad. She stated that he was in his usual state of health until Friday night. Danette stated that once she has a better understanding of what is happening with  her dad, she can make better transition plans.   TOC following for transition needs.   Expected Discharge Plan: [place] Barriers to Discharge: Continued Medical Work up   Patient Goals and CMS Choice Patient states their goals for this hospitalization and ongoing recovery are:: quality of life CMS Medicare.gov Compare Post Acute Care list provided to:: Patient Represenative (must comment) Mckinley Jewel, daughter) Choice offered to / list presented to : Adult Children Mckinley Jewel, daughter)  Expected Discharge Plan and Services Expected Discharge Plan: [place]   Discharge Planning Services: CM Consult   Living arrangements for the past 2 months: Single Family Home                                      Prior Living Arrangements/Services Living arrangements for the past 2 months: Single Family Home Lives with:: Self, Adult Children Patient language and need for interpreter reviewed:: Yes        Need for Family Participation in Patient Care: Yes (Comment) Care giver support system in place?: Yes (comment) Current home services: DME (cane; raised toilet; grab bars in the bathroom) Criminal Activity/Legal Involvement Pertinent to Current Situation/Hospitalization: No - Comment as needed  Activities of Daily Living      Permission Sought/Granted  Emotional Assessment Appearance:: Appears stated age     Orientation: : Oriented to Self Alcohol / Substance Use: Not Applicable Psych Involvement: No (comment)  Admission diagnosis:  Hepatic encephalopathy (HCC) [K72.90] Hyperammonemia ([place]) [E72.20] Weakness [R53.1] Elevated LFTs [R79.89] ESRD on dialysis ([place]) [N18.6, Z99.2] Acute encephalopathy [G93.40] Generalized weakness [R53.1] Patient Active Problem List   Diagnosis Date Noted  . Acute encephalopathy 02/06/2020  . Hepatitis 02/15/2020  . End-stage renal disease on hemodialysis ([place])  09/20/2019  . HLD (hyperlipidemia) 09/20/2019  . Prostate cancer ([place]) 09/20/2019  . Umbilical hernia 95/32/0233  . Acute pancreatitis 09/20/2019  . History of noncompliance with medical treatment 09/20/2019  . Diabetes mellitus type 2, uncontrolled, with complications ([place]) 43/56/8616  . Acute GI bleeding 09/20/2019  . Unspecified protein-calorie malnutrition ([place]) 08/04/2019  . Shoulder pain, bilateral 11/22/2018  . Impingement syndrome of right shoulder 11/08/2018  . Hypertensive emergency 10/16/2018  . Hypertensive encephalopathy 10/15/2018  . Allergy, unspecified, initial encounter 10/05/2018  . Anaphylactic shock, unspecified, initial encounter 10/05/2018  . Other fatigue 07/06/2018  . Headache 06/10/2018  . Left bundle-branch block, unspecified 02/14/2018  . Left bundle branch block (LBBB) determined by electrocardiography 02/13/2018  . Aortic stenosis 02/13/2018  . NSTEMI (non-ST elevated myocardial infarction) ([place]) 02/13/2018  . Constipation 12/08/2017  . Abdominal pain 12/06/2017  . Leukocytosis 12/06/2017  . Gout 12/06/2017  . Chronic pain 12/06/2017  . Acute on chronic cholecystitis s/p lap cholecystectomy 12/02/2017 12/02/2017  . Cardiac pacemaker in situ (Medtronic DDD) 12/02/2017  . Encounter for immunization 11/24/2017  . Abnormality of albumin 09/22/2017  . Secondary hyperparathyroidism of renal origin ([place]) 09/20/2017  . Unspecified Escherichia coli (E. coli) as the cause of diseases classified elsewhere 09/15/2017  . Chills (without fever) 09/06/2017  . Hypercalcemia 05/18/2017  . Anemia in chronic kidney disease 05/03/2017  . Fever, unspecified 04/30/2017  . Iron deficiency anemia, unspecified 04/30/2017  . Other specified coagulation defects ([place]) 04/30/2017  . Pruritus, unspecified 04/30/2017  . Shortness of breath 04/30/2017  . Type 2 diabetes mellitus with diabetic peripheral angiopathy without gangrene ([place]) 04/30/2017  . Acute hypoxemic respiratory  failure ([place]) 04/18/2017  . Heart block 04/17/2017  . Elevated troponin 04/17/2017  . ESRD on hemodialysis ([place]) 04/12/2016  . H/O: stroke 04/11/2016  . Saccular aneurysm of infrarenal aorta with thrombosed dissection of abdominal aorta 04/11/2016  . Obesity (BMI 30-39.9) 04/11/2016  . Syncope 04/11/2016  . Hypothyroidism, unspecified 05/01/2015  . Prostate CA ([place]) 12/28/2014  . Chest pain 12/28/2014  . C. difficile colitis 10/04/2014  . Hyperkalemia 03/11/2013  . Anemia 03/11/2013  . DM (diabetes mellitus), type 2 with renal complications, diet controlled 03/11/2013  . HTN (hypertension) 03/11/2013  . Hyperlipidemia 03/11/2013  . ESRD on dialysis ([place]) 03/20/2011   PCP:  Seward Carol, MD Pharmacy:   [id number] - [place], [place] [address] [place] [address] Phone: [phone] Fax: [phone]  [place], [place] - [address] [facility]. [address] [place] [address] Phone: [phone] Fax: [place], [place] - [address] [place] [address] Phone: [phone] Fax: [phone]     Social Determinants of Health (SDOH) Interventions    Readmission Risk Interventions Readmission Risk Prevention Plan 09/25/2019  Transportation Screening Complete  Medication Review (RN Care Manager) Referral to Pharmacy  PCP or Specialist appointment within 3-5 days of  discharge Complete  HRI or Home Care Consult Complete  SW Recovery Care/Counseling Consult Complete  Palliative Care Screening Not [place] Not Applicable  Some recent data might be hidden

## 2020-02-06 NOTE — Progress Notes (Signed)
In and out catheter not completed as bladder scan showed 9mL, MD has been notified and RN will continue to monitor this patient.

## 2020-02-06 NOTE — Progress Notes (Signed)
PROGRESS NOTE    Sean Best  UTM:[id number] DOB: [birth year] DOA: 02/26/2020 PCP: Sean Carol, MD   Brief Narrative: 86 year old with past medical history significant for GI bleed, status post cholecystectomy 2019, history of pancreatitis, anemia, aortic stenosis, heart block status post pacemaker, diabetes, ESRD on hemodialysis MWF, gout, history of CVA, hyperlipidemia, hypertension, prostate cancer, CAD who presents with altered mental status.  Patient has had a week of increasing weakness and fatigue and now presents with confusion.  He has been constipated, decreased oral intake.  He has right foot wound that he has been caring at home for the last 3 weeks.  He follows with Sean Best.   Evaluation in the ED patient systolic blood pressure was low in the 80s, transaminases AST 578 ALT 439, leukocytosis, INR 2.1.  Ammonia 177.  Chest x-ray show some congestion.  CT abdomen showed bilateral 2 mm obstructing renal stone, numerous bilateral partially calcified and noncalcified renal cyst, diffuse urinary bladder wall thickening, multiple prostate radiation implantation seeds.  Quadrant ultrasound: Status post cholecystectomy, mild prominence of the common bile duct which may be due to post surgical dilation, not definite if shadowing stone, small amount of perihepatic ascites.  Patient admitted with hepatic encephalopathy, hepatitis, concern for infection.  Assessment & Plan:   Principal Problem:   Acute encephalopathy Active Problems:   Anemia   HTN (hypertension)   Hyperlipidemia   Heart block   Cardiac pacemaker in situ ([employer] DDD)   Leukocytosis   End-stage renal disease on hemodialysis (HCC)   HLD (hyperlipidemia)   Diabetes mellitus type 2, uncontrolled, with complications (HCC)   Anemia in chronic kidney disease   Hypothyroidism, unspecified   Hepatitis   1-Acute hepatic encephalopathy: Patient presented with elevated ammonia at 77 the setting of hepatitis. Lactulose  changed to per rectum, patient failed a swallow evaluation. GI has been consulted CT head: No acute intracranial abnormality  2-Acute hepatitis, transaminases, status post cholecystectomy 2019 -Future elevation of AST ALT. -Hold statins. -CT did not show any focal liver abnormality. -Viral hepatitis panel negative. -GI has been consulted. -Concern for shock liver, related to hypotension and underlying infection  3-Diabetes: Presented with hypoglycemia CBG in the 33's. Continue with D10  4-Leukocytosis, hypotension: Concern for infection, follow blood cultures.  Check UA. We will ask vascular to follow-up on right foot Will add vancomycin, continue with ceftriaxone Per daughter patient was getting IV antibiotics with dialysis for foot wound infection Chest  x-ray: Cardiomegaly with vascular congestion.  5-ESRD on hemodialysis MWF: Neurology consulted. Continue with calcitriol, cinacalcet, PhosLo. Dialysis today.  6-Hypertension, aortic stenosis, heart block status post pacemaker: Continue with metoprolol, holder parameter  for hypotension  8-Hyperlipidemia, history of CVA: Continue with aspirin.  Hold atorvastatin due to transaminases.   9-Right foot with wound: Wound care consulted. Vascular consulted, further  discoloration of the right foot     Estimated body mass index is 38.27 kg/m as calculated from the following:   Height as of this encounter: 5\' 5"  (1.651 m).   Weight as of this encounter: 104.3 kg.   DVT prophylaxis: SCDs Code Status: Full code Family Communication: Daughter at bedside Disposition Plan:  Status is: Inpatient  Remains inpatient appropriate because:IV treatments appropriate due to intensity of illness or inability to take PO   Dispo: The patient is from: Home              Anticipated d/c is to: To be determined  Anticipated d/c date is: 3 days              Patient currently is not medically stable to d/c.         Consultants:  Vascular vascular Vascular GI Nephrology   Procedures:   HD  Antimicrobials:    Subjective: He is alert, he was able to tell me he is in the hospital, Sean Best, his name.  Per daughter he has improved some but not back to baseline.   Objective: Vitals:   02/06/20 0500 02/06/20 0543 02/06/20 0630 02/06/20 0939  BP: 118/90  107/88 123/88  Pulse: 82  78 85  Resp: 11   19  Temp:  97.8 F (36.6 C) 98.1 F (36.7 C) 98.2 F (36.8 C)  TempSrc:  Oral Oral   SpO2: 97%  95% 90%  Weight:      Height:        Intake/Output Summary (Last 24 hours) at 02/06/2020 1008 Last data filed at 02/06/2020 0900 Gross per 24 hour  Intake 0 ml  Output -  Net 0 ml   Filed Weights   02/03/2020 1234  Weight: 104.3 kg    Examination:  General exam: Appears calm and comfortable  Respiratory system: Clear to auscultation. Respiratory effort normal. Cardiovascular system: S1 & S2 heard, RRR. No JVD, murmurs, rubs, gallops or clicks. No pedal edema. Gastrointestinal system: Abdomen is nondistended, soft and nontender. No organomegaly or masses felt. Normal bowel sounds heard. Central nervous system: Alert follows command Extremities: right foot with redness, discoloration   Data Reviewed: I have personally reviewed following labs and imaging studies  CBC: Recent Labs  Lab 02/28/2020 1312 02/06/20 0800  WBC 15.8* 16.1*  NEUTROABS 14.2*  --   HGB 13.0 12.8*  HCT 44.1 40.5  MCV 87.8 84.9  PLT 225 188   Basic Metabolic Panel: Recent Labs  Lab 01/31/2020 1312 02/06/20 0800  NA 136 137  K 4.6 4.7  CL 89* 89*  CO2 22 21*  GLUCOSE 79 111*  BUN 33* 38*  CREATININE 7.59* 8.57*  CALCIUM 8.8* 8.8*   GFR: Estimated Creatinine Clearance: 7.1 mL/min (A) (by C-G formula based on SCr of 8.57 mg/dL (H)). Liver Function Tests: Recent Labs  Lab 02/03/2020 1312 02/06/20 0800  AST 578* 1,382*  ALT 439* 860*  ALKPHOS 126 132*  BILITOT 1.5* 1.6*  PROT 5.9* 5.9*  ALBUMIN 3.1*  2.9*   No results for input(s): LIPASE, AMYLASE in the last 168 hours. Recent Labs  Lab 02/01/2020 1748 02/06/20 0800  AMMONIA 77* 49*   Coagulation Profile: Recent Labs  Lab 02/16/2020 1312 02/06/20 0800  INR 2.1* 2.6*   Cardiac Enzymes: No results for input(s): CKTOTAL, CKMB, CKMBINDEX, TROPONINI in the last 168 hours. BNP (last 3 results) No results for input(s): PROBNP in the last 8760 hours. HbA1C: No results for input(s): HGBA1C in the last 72 hours. CBG: Recent Labs  Lab 02/07/2020 1744 02/12/2020 1849 02/24/2020 1945 02/15/2020 2348 02/06/20 0704  GLUCAP 97 84 93 88 111*   Lipid Profile: No results for input(s): CHOL, HDL, LDLCALC, TRIG, CHOLHDL, LDLDIRECT in the last 72 hours. Thyroid Function Tests: No results for input(s): TSH, T4TOTAL, FREET4, T3FREE, THYROIDAB in the last 72 hours. Anemia Panel: No results for input(s): VITAMINB12, FOLATE, FERRITIN, TIBC, IRON, RETICCTPCT in the last 72 hours. Sepsis Labs: No results for input(s): PROCALCITON, LATICACIDVEN in the last 168 hours.  Recent Results (from the past 240 hour(s))  Blood culture (routine x 2)  Status: None (Preliminary result)   Collection Time: 02/04/2020  1:13 PM   Specimen: BLOOD  Result Value Ref Range Status   Specimen Description BLOOD SITE NOT SPECIFIED  Final   Special Requests AEROBIC BOTTLE ONLY Blood Culture adequate volume  Final   Culture   Final    NO GROWTH < 24 HOURS Performed at [facility], [place] [address], [place], [place] [address]    Report Status PENDING  Incomplete  Blood culture (routine x 2)     Status: None (Preliminary result)   Collection Time: 02/25/2020  1:15 PM   Specimen: BLOOD  Result Value Ref Range Status   Specimen Description BLOOD SITE NOT SPECIFIED  Final   Special Requests AEROBIC BOTTLE ONLY Blood Culture adequate volume  Final   Culture   Final    NO GROWTH < 24 HOURS Performed at [facility], [place] [address], [place], [place] [address]     Report Status PENDING  Incomplete  Resp Panel by RT-PCR (Flu A&B, Covid) Nasopharyngeal Swab     Status: None   Collection Time: 02/16/2020  1:54 PM   Specimen: Nasopharyngeal Swab; Nasopharyngeal(NP) swabs in vial transport medium  Result Value Ref Range Status   SARS Coronavirus 2 by RT PCR NEGATIVE NEGATIVE Final    Comment: (NOTE) SARS-CoV-2 target nucleic acids are NOT DETECTED.  The SARS-CoV-2 RNA is generally detectable in upper respiratory specimens during the acute phase of infection. The lowest concentration of SARS-CoV-2 viral copies this assay can detect is 138 copies/mL. A negative result does not preclude SARS-Cov-2 infection and should not be used as the sole basis for treatment or other patient management decisions. A negative result may occur with  improper specimen collection/handling, submission of specimen other than nasopharyngeal swab, presence of viral mutation(s) within the areas targeted by this assay, and inadequate number of viral copies(<138 copies/mL). A negative result must be combined with clinical observations, patient history, and epidemiological information. The expected result is Negative.  Fact Sheet for Patients:  EntrepreneurPulse.com.au  Fact Sheet for Healthcare Providers:  IncredibleEmployment.be  This test is no t yet approved or cleared by the [place] FDA and  has been authorized for detection and/or diagnosis of SARS-CoV-2 by FDA under an Emergency Use Authorization (EUA). This EUA will remain  in effect (meaning this test can be used) for the duration of the COVID-19 declaration under Section 564(b)(1) of the Act, 21 U.S.C.section 360bbb-3(b)(1), unless the authorization is terminated  or revoked sooner.       Influenza A by PCR NEGATIVE NEGATIVE Final   Influenza B by PCR NEGATIVE NEGATIVE Final    Comment: (NOTE) The Xpert Xpress SARS-CoV-2/FLU/RSV plus assay is intended as an aid in the  diagnosis of influenza from Nasopharyngeal swab specimens and should not be used as a sole basis for treatment. Nasal washings and aspirates are unacceptable for Xpert Xpress SARS-CoV-2/FLU/RSV testing.  Fact Sheet for Patients: EntrepreneurPulse.com.au  Fact Sheet for Healthcare Providers: IncredibleEmployment.be  This test is not yet approved or cleared by the [place] FDA and has been authorized for detection and/or diagnosis of SARS-CoV-2 by FDA under an Emergency Use Authorization (EUA). This EUA will remain in effect (meaning this test can be used) for the duration of the COVID-19 declaration under Section 564(b)(1) of the Act, 21 U.S.C. section 360bbb-3(b)(1), unless the authorization is terminated or revoked.  Performed at [facility], [place] [address] [place], [place] [address]  Radiology Studies: CT ABDOMEN PELVIS WO CONTRAST  Result Date: 02/24/2020 CLINICAL DATA:  Abdominal distension with altered mental status and constipation. EXAM: CT ABDOMEN AND PELVIS WITHOUT CONTRAST TECHNIQUE: Multidetector CT imaging of the abdomen and pelvis was performed following the standard protocol without IV contrast. COMPARISON:  September 20, 2019 FINDINGS: Lower chest: No acute abnormality. Hepatobiliary: No focal liver abnormality is seen. Status post cholecystectomy. No biliary dilatation. Pancreas: Unremarkable. No pancreatic ductal dilatation or surrounding inflammatory changes. Spleen: Normal in size without focal abnormality. Adrenals/Urinary Tract: Adrenal glands are unremarkable. Kidneys are normal in size, without obstructing renal calculi or hydronephrosis. Numerous bilateral partially calcified and noncalcified cysts are seen within both kidneys kidneys. Bilateral 2 mm nonobstructing renal stones are seen. The urinary bladder is contracted and subsequently limited in evaluation. Diffuse thick uretero bladder wall thickening is  suspected. This is seen on the prior study. Stomach/Bowel: Stomach is within normal limits. Appendix appears normal. No evidence of bowel wall thickening, distention, or inflammatory changes. Vascular/Lymphatic: Aortic atherosclerosis with stable 3.1 cm infrarenal aneurysmal dilatation. No enlarged abdominal or pelvic lymph nodes. Reproductive: Multiple prostate radiation implantation seeds are seen. Other: No abdominal wall hernia or abnormality. No abdominopelvic ascites. Musculoskeletal: Marked severity chronic and multilevel degenerative changes are seen throughout the lumbar spine. IMPRESSION: 1. Bilateral 2 mm nonobstructing renal stones. 2. Numerous bilateral partially calcified and noncalcified renal cysts. 3. Diffuse urinary bladder wall thickening which is seen on the prior study and may represent sequelae associated with cystitis. 4. Multiple prostate radiation implantation seeds. 5. Aortic atherosclerosis. Aortic Atherosclerosis (ICD10-I70.0). Electronically Signed   By: Virgina Norfolk M.D.   On: 02/29/2020 21:09   DG Abdomen 1 View  Result Date: 02/16/2020 CLINICAL DATA:  Weakness, no bowel movement for 1 week EXAM: ABDOMEN - 1 VIEW COMPARISON:  September 20, 2019 FINDINGS: Brachytherapy seeds in the prostate. Surgical clips in the RIGHT upper quadrant compatible with prior cholecystectomy. Upper abdomen excluded from view.  Stomach with mild distension. Gas and stool in the rectum. Question of mild haustral thickening of the distal transverse colon. No signs of small bowel obstruction. Faint calcification overlies the LEFT abdomen more lateral than expected for ureteral calculus though ureteral calculus not excluded measuring 4 mm. Evidence of calcified atheromatous plaque in the abdominal aorta extending in the iliac vessels. On limited assessment no acute skeletal process. IMPRESSION: 1. Question of some haustral thickening of the distal transverse colon, findings could be seen in the setting of  colitis. No sign of bowel obstruction. 2. Potential mid LEFT ureteral calculus. 3. Upper abdomen excluded from view 4. Signs of aortoiliac atherosclerosis. Electronically Signed   By: Zetta Bills M.D.   On: 02/20/2020 13:06   CT HEAD WO CONTRAST  Result Date: 02/11/2020 CLINICAL DATA:  Decreased level of consciousness EXAM: CT HEAD WITHOUT CONTRAST TECHNIQUE: Contiguous axial images were obtained from the base of the skull through the vertex without intravenous contrast. COMPARISON:  10/17/2018 FINDINGS: Brain: There is atrophy and chronic small vessel disease changes. No acute intracranial abnormality. Specifically, no hemorrhage, hydrocephalus, mass lesion, acute infarction, or significant intracranial injury. Vascular: No hyperdense vessel or unexpected calcification. Skull: No acute calvarial abnormality. Sinuses/Orbits: No acute findings Other: None IMPRESSION: Atrophy, chronic microvascular disease. No acute intracranial abnormality. Electronically Signed   By: Rolm Baptise M.D.   On: 02/16/2020 14:05   US Abdomen Limited  Result Date: 02/01/2020 CLINICAL DATA:  Elevated LFTs EXAM: ULTRASOUND ABDOMEN LIMITED RIGHT UPPER QUADRANT COMPARISON:  None. FINDINGS: Gallbladder: The  patient is status post cholecystectomy. No biliary ductal dilation. Common bile duct: Diameter: 1 cm Liver: No focal lesion identified. Within normal limits in parenchymal echogenicity. Portal vein is patent on color Doppler imaging with normal direction of blood flow towards the liver. Other: Small amount of perihepatic ascites is seen. IMPRESSION: Status post cholecystectomy. Mild prominence of the common bile duct which may be due to postsurgical dilation, no definite shadowing stones are seen. Small amount of perihepatic ascites. Electronically Signed   By: Prudencio Pair M.D.   On: 02/23/2020 19:53   DG Chest Portable 1 View  Result Date: 02/23/2020 CLINICAL DATA:  Weakness for 1 week, no bowel movements for 1 week EXAM:  PORTABLE CHEST 1 VIEW COMPARISON:  March 29, 2019 FINDINGS: Trachea midline. Heart size remains enlarged. RIGHT-sided pacer device, 2 lead with power pack over the RIGHT chest with similar appearance. Engorgement of central pulmonary vasculature. No lobar consolidation or pleural effusion. On limited assessment no acute skeletal process. IMPRESSION: Cardiomegaly with vascular congestion. Electronically Signed   By: Zetta Bills M.D.   On: 02/19/2020 13:03        Scheduled Meds: . aspirin  81 mg Oral Daily  . [START ON 02/07/2020] calcitRIOL  1 mcg Oral Q M,W,F-HD  . calcium acetate  2,001 mg Oral TID WC  . Chlorhexidine Gluconate Cloth  6 each Topical Q0600  . [START ON 02/07/2020] cinacalcet  90 mg Oral Q M,W,F  . ferrous sulfate  325 mg Oral Q breakfast  . heparin  5,000 Units Subcutaneous Q8H  . lactulose  10 g Oral TID  . metoprolol tartrate  12.5 mg Oral BID  . sodium chloride flush  3 mL Intravenous Q12H   Continuous Infusions: . cefTRIAXone (ROCEPHIN)  IV    . dextrose 522 mL (02/04/2020 1640)     LOS: 1 day    Time spent: 35 minutes    Arnel [name] A Baden Betsch, MD [facility]   If 7PM-7AM, please contact night-coverage [email or web address]  02/06/2020, 10:08 AM

## 2020-02-06 NOTE — Consult Note (Signed)
Referring Provider: Dr. Niel Hummer Primary Care Physician:  Seward Carol, MD Primary Gastroenterologist:  Dr. Paulita Fujita [facility])  Reason for Consultation:  Transaminitis  HPI: Sean Best is a 86 y.o. male with ESRD on HD, DM type II, history of GI bleeding, cholecystectomy, pancreatitis, pacemaker, and CAD presenting for consultation of transaminitis.  Patient oriented to self only.  Able to report name, though unable to report situation, date, or location (reports location as "[facility]").     Called patient's daughter, Sean Best, who provided some additional history.  She states the patient was at hemodialysis on Friday 12/3.  He fell at hemodialysis, but was discharged afterwards.  She states on the way home, he had a syncopal episode in the car.  Then, at home, patient had another syncopal episode.  Patient states she called 911, and EMS evaluated the patient but did not feel that the patient needed to be taken to the hospital via EMS at that time.  Sean Best states that on Saturday 12/4, patient fell in the bathroom, and when she went to assist him, she states his fistula was squirting blood.  She states patient also passed out again on Saturday.  She called EMS, and he was taken to the ED.  He was discharged Patient's daughter states patient was discharged after bleeding resolved.  On Sunday, patient was complaining of abdominal pain, though patient's daughter notes that patient had not had a bowel movement for 3 to 4 days.  She states patient became confused on Monday 12/6 and thus was brought to the ED and admitted.   Past Medical History:  Diagnosis Date  . Anemia   . Arthritis    "knees" (04/11/2016)  . Blind left eye    S/P trauma  . CHF (congestive heart failure) ([place])   . Chronic total occlusion of artery of the extremities (HCC)    pt not aware of this  . Claudication ([place])   . ESRD on dialysis [facility])    "MWF; [name]" ((04/10/2016)  . ESRD on peritoneal  dialysis [facility])    Started dialysis around April 2015 per son.  Has been doing peritoneal dialysis at home.     . Gout   . Hyperlipidemia   . Hypertension   . Overweight(278.02)   . Presence of permanent cardiac pacemaker    medtronic  . Prostate cancer ([place]) 1990s  . Shingles   . Stroke [facility]) ~1998   denies residual on 04/11/2016  . Type II diabetes mellitus (HCC)    diet controlled  . Umbilical hernia 0/35/4656    Past Surgical History:  Procedure Laterality Date  . A/V FISTULAGRAM N/A 11/01/2018   Procedure: A/V FISTULAGRAM;  Surgeon: Serafina Mitchell, MD;  Location: [place] CV [facility];  Service: Cardiovascular;  Laterality: N/A;  . AV FISTULA PLACEMENT, RADIOCEPHALIC  81/27/5170   Left arm  . CAPD INSERTION N/A 03/06/2013   Procedure: LAPAROSCOPIC INSERTION CONTINUOUS AMBULATORY PERITONEAL DIALYSIS  (CAPD) CATHETER;  Surgeon: Edward Jolly, MD;  Location: [place];  Service: General;  Laterality: N/A;  . CATARACT EXTRACTION Right   . CHOLECYSTECTOMY  12/02/2017   LAPROSCOPIC  . CORONARY STENT INTERVENTION N/A 02/11/2018   Procedure: CORONARY STENT INTERVENTION;  Surgeon: Sherren Mocha, MD;  Location: [place] [facility];  Service: Cardiovascular;  Laterality: N/A;  . ESOPHAGOGASTRODUODENOSCOPY (EGD) WITH PROPOFOL N/A 10/18/2019   Procedure: ESOPHAGOGASTRODUODENOSCOPY (EGD) WITH PROPOFOL;  Surgeon: Arta Silence, MD;  Location: [facility];  Service: Endoscopy;  Laterality: N/A;  .  EUS N/A 10/18/2019   Procedure: FULL UPPER ENDOSCOPIC ULTRASOUND (EUS) RADIAL;  Surgeon: Arta Silence, MD;  Location: [facility];  Service: Endoscopy;  Laterality: N/A;  . EYE SURGERY Left 1970s   for eye injury  . FISTULA SUPERFICIALIZATION Left 47/10/2954   Procedure: PLICATION OF LEFT RADIAL CEPHALIC ANEURYSM;  Surgeon: Rosetta Posner, MD;  Location: [place];  Service: Vascular;  Laterality: Left;  . HERNIA REPAIR    . INSERTION PROSTATE RADIATION SEED    . LAPAROSCOPIC CHOLECYSTECTOMY SINGLE  SITE WITH INTRAOPERATIVE CHOLANGIOGRAM N/A 12/02/2017   Procedure: LAPAROSCOPIC CHOLECYSTECTOMY;  Surgeon: Michael Boston, MD;  Location: [place];  Service: General;  Laterality: N/A;  . LEFT HEART CATH AND CORONARY ANGIOGRAPHY N/A 02/11/2018   Procedure: LEFT HEART CATH AND CORONARY ANGIOGRAPHY;  Surgeon: Sherren Mocha, MD;  Location: [place] CV LAB;  Service: Cardiovascular;  Laterality: N/A;  . PACEMAKER IMPLANT N/A 04/20/2017   Procedure: PACEMAKER IMPLANT;  Surgeon: Evans Lance, MD;  Location: [place] [facility];  Service: Cardiovascular;  Laterality: N/A;  . PERIPHERAL VASCULAR BALLOON ANGIOPLASTY  11/01/2018   Procedure: PERIPHERAL VASCULAR BALLOON ANGIOPLASTY;  Surgeon: Serafina Mitchell, MD;  Location: [place] [facility];  Service: Cardiovascular;;  Lt lower arm fistula   . UMBILICAL HERNIA REPAIR N/A 03/06/2013   Procedure: HERNIA REPAIR UMBILICAL WITH MESH;  Surgeon: Edward Jolly, MD;  Location: [facility];  Service: General;  Laterality: N/A;    Prior to Admission medications   Medication Sig Start Date End Date Taking? Authorizing Provider  acetaminophen (TYLENOL) 500 MG tablet Take 1,000 mg by mouth every 6 (six) hours as needed for mild pain or headache.   Yes [provider]  amLODipine (NORVASC) 5 MG tablet Take 5 mg by mouth daily. 08/18/19  Yes [provider]  aspirin 81 MG chewable tablet Chew 1 tablet (81 mg total) by mouth daily. 02/14/18  Yes Oretha Milch D, MD  atorvastatin (LIPITOR) 40 MG tablet Take 40 mg by mouth every evening.  07/07/17  Yes [provider]  calcium acetate (PHOSLO) 667 MG capsule Take 3 capsules (2,001 mg total) by mouth 3 (three) times daily with meals. 04/30/18  Yes Nita Sells, MD  chlorhexidine (HIBICLENS) 4 % external liquid Apply topically daily as needed. Put 2 tablespoons in a basin of warm water and soak your foot for the next 7 days Patient taking differently: Apply 1 application topically daily as needed  (foot blister). Put 2 tablespoons in a basin of warm water and soak your foot for the next 7 days 01/24/20  Yes Montine Circle, PA-C  cinacalcet [facility]) 90 MG tablet Take 90 mg by mouth every Monday, Wednesday, and Friday.    Yes [provider]  ELDERBERRY PO Take 1 capsule by mouth daily. Gummie    Yes [provider]  ferrous sulfate 325 (65 FE) MG EC tablet Take 325 mg by mouth daily with breakfast.    Yes [provider]  lidocaine-prilocaine (EMLA) cream Apply 1 application topically as needed (for port access).  10/28/17  Yes [provider]  metoprolol tartrate (LOPRESSOR) 25 MG tablet Take 0.5 tablets (12.5 mg total) by mouth 2 (two) times daily. 09/26/19  Yes Allie Bossier, MD  nitroGLYCERIN (NITROSTAT) 0.4 MG SL tablet Place 1 tablet (0.4 mg total) under the tongue every 5 (five) minutes as needed for chest pain. 04/18/19  Yes Leonie Man, MD  traMADol (ULTRAM) 50 MG tablet Take 1 tablet (50 mg  total) by mouth 2 (two) times daily as needed for moderate pain. 01/30/20  Yes Dagoberto Ligas, PA-C  VELPHORO 500 MG chewable tablet Chew 500 mg by mouth 3 (three) times daily with meals.  01/28/18  Yes [provider]  VITAMIN D PO Take 1 capsule by mouth daily.  01/05/20 01/03/21 Yes [provider]  Blood Glucose Calibration (TRUE METRIX LEVEL 2) Normal SOLN  09/14/19   [provider]  Blood Glucose Monitoring Suppl (TRUE METRIX METER) w/Device KIT  09/14/19   [provider]  calcitRIOL (ROCALTROL) 0.5 MCG capsule Take 2 capsules (1 mcg total) by mouth every Monday, Wednesday, and Friday with hemodialysis. 09/27/19   Allie Bossier, MD  Darbepoetin Alfa (ARANESP) 60 MCG/0.3ML SOSY injection Inject 0.3 mLs (60 mcg total) into the vein every Thursday with hemodialysis. Patient taking differently: Inject 60 mcg into the vein every 7 (seven) days. On Fridays 09/28/19   Allie Bossier, MD  Methoxy PEG-Epoetin Beta (MIRCERA  IJ) Mircera 10/11/19 10/09/20  [provider]  oxyCODONE-acetaminophen (PERCOCET) 5-325 MG tablet Take 1 tablet by mouth every 6 (six) hours as needed for severe pain. Patient not taking: Reported on 02/06/2020 01/02/20   Rosetta Posner, MD  TRUE METRIX BLOOD GLUCOSE TEST test strip SMARTSIG:Via Meter 09/14/19   [provider]  TRUEplus Lancets 30G MISC  09/14/19   [provider]    Scheduled Meds: . aspirin  81 mg Oral Daily  . [START ON 02/07/2020] calcitRIOL  1 mcg Oral Q M,W,F-HD  . calcium acetate  2,001 mg Oral TID WC  . Chlorhexidine Gluconate Cloth  6 each Topical Q0600  . [START ON 02/07/2020] cinacalcet  90 mg Oral Q M,W,F  . ferrous sulfate  325 mg Oral Q breakfast  . heparin  5,000 Units Subcutaneous Q8H  . lactulose  10 g Oral TID  . metoprolol tartrate  12.5 mg Oral BID  . sodium chloride flush  3 mL Intravenous Q12H   Continuous Infusions: . cefTRIAXone (ROCEPHIN)  IV    . dextrose 522 mL (02/16/2020 1640)  . [START ON 02/07/2020] vancomycin    . vancomycin     PRN Meds:.  Allergies as of 02/18/2020 - Review Complete 01/30/2020  Allergen Reaction Noted  . Cardura [doxazosin mesylate] Other (See Comments) 03/11/2013  . Tape Dermatitis 03/14/2018    Family History  Problem Relation Age of Onset  . Cancer Mother   . Heart disease Mother   . Cancer Father     Social History   Socioeconomic History  . Marital status: Widowed    Spouse name: Not on file  . Number of children: 2  . Years of education: Not on file  . Highest education level: Not on file  Occupational History  . Occupation: Retired  Tobacco Use  . Smoking status: Never Smoker  . Smokeless tobacco: Never Used  Vaping Use  . Vaping Use: Never used  Substance and Sexual Activity  . Alcohol use: No  . Drug use: No  . Sexual activity: Not Currently  Other Topics Concern  . Not on file  Social History Narrative   Patient lives with his daughter.   Social Determinants  of Health   Financial Resource Strain:   . Difficulty of Paying Living Expenses: Not on file  Food Insecurity:   . Worried About Charity fundraiser in the Last Year: Not on file  . Ran Out of Food in the Last Year: Not on file  Transportation Needs:   . Film/video editor (Medical): Not on file  . Lack of Transportation (Non-Medical): Not on file  Physical Activity:   . Days of Exercise per Week: Not on file  . Minutes of Exercise per Session: Not on file  Stress:   . Feeling of Stress : Not on file  Social Connections:   . Frequency of Communication with Friends and Family: Not on file  . Frequency of Social Gatherings with Friends and Family: Not on file  . Attends Religious Services: Not on file  . Active Member of Clubs or Organizations: Not on file  . Attends Archivist Meetings: Not on file  . Marital Status: Not on file  Intimate Partner Violence:   . Fear of Current or Ex-Partner: Not on file  . Emotionally Abused: Not on file  . Physically Abused: Not on file  . Sexually Abused: Not on file    Review of Systems: Review of Systems  Unable to perform ROS: Mental status change    Physical Exam: Vital signs: Vitals:   02/06/20 0630 02/06/20 0939  BP: 107/88 123/88  Pulse: 78 85  Resp:  19  Temp: 98.1 F (36.7 C) 98.2 F (36.8 C)  SpO2: 95% 90%     Physical Exam Vitals reviewed.  Constitutional:      General: He is not in acute distress. HENT:     Head: Normocephalic and atraumatic.     Nose: Nose normal. No congestion.     Mouth/Throat:     Mouth: Mucous membranes are moist.     Pharynx: Oropharynx is clear.  Eyes:     General: No scleral icterus.    Extraocular Movements: Extraocular movements intact.     Conjunctiva/sclera: Conjunctivae normal.  Cardiovascular:     Rate and Rhythm: Normal rate and regular rhythm.     Pulses: Normal pulses.  Pulmonary:     Effort: Pulmonary effort is normal. No respiratory distress.     Breath  sounds: Normal breath sounds.  Abdominal:     General: Bowel sounds are normal. There is no distension.     Palpations: Abdomen is soft. There is no mass.     Tenderness: There is no abdominal tenderness. There is no guarding or rebound.     Hernia: No hernia is present.  Musculoskeletal:        General: No swelling or tenderness.     Cervical back: Normal range of motion and neck supple.     Comments: AV fistula left upper extremity  Skin:    General: Skin is dry.     Coloration: Skin is not jaundiced.     Comments: Right lower extremity cool, multiple ulcers present  Neurological:     General: No focal deficit present.     Mental Status: He is lethargic and disoriented.  Psychiatric:        Attention and Perception: He is inattentive.        Behavior: Behavior is cooperative.        Cognition and Memory: Cognition is impaired.      GI:  Lab Results: Recent Labs    02/04/2020 1312 02/06/20 0800  WBC 15.8* 16.1*  HGB 13.0 12.8*  HCT 44.1 40.5  PLT 225 171   BMET Recent Labs    02/29/2020 1312 02/06/20 0800  NA 136 137  K 4.6 4.7  CL 89* 89*  CO2 22 21*  GLUCOSE 79 111*  BUN 33* 38*  CREATININE 7.59*  8.57*  CALCIUM 8.8* 8.8*   LFT Recent Labs    02/06/20 0800  PROT 5.9*  ALBUMIN 2.9*  AST 1,382*  ALT 860*  ALKPHOS 132*  BILITOT 1.6*   PT/INR Recent Labs    02/21/2020 1312 02/06/20 0800  LABPROT 22.9* 26.7*  INR 2.1* 2.6*     Studies/Results: CT ABDOMEN PELVIS WO CONTRAST  Result Date: 02/20/2020 CLINICAL DATA:  Abdominal distension with altered mental status and constipation. EXAM: CT ABDOMEN AND PELVIS WITHOUT CONTRAST TECHNIQUE: Multidetector CT imaging of the abdomen and pelvis was performed following the standard protocol without IV contrast. COMPARISON:  September 20, 2019 FINDINGS: Lower chest: No acute abnormality. Hepatobiliary: No focal liver abnormality is seen. Status post cholecystectomy. No biliary dilatation. Pancreas: Unremarkable. No  pancreatic ductal dilatation or surrounding inflammatory changes. Spleen: Normal in size without focal abnormality. Adrenals/Urinary Tract: Adrenal glands are unremarkable. Kidneys are normal in size, without obstructing renal calculi or hydronephrosis. Numerous bilateral partially calcified and noncalcified cysts are seen within both kidneys kidneys. Bilateral 2 mm nonobstructing renal stones are seen. The urinary bladder is contracted and subsequently limited in evaluation. Diffuse thick uretero bladder wall thickening is suspected. This is seen on the prior study. Stomach/Bowel: Stomach is within normal limits. Appendix appears normal. No evidence of bowel wall thickening, distention, or inflammatory changes. Vascular/Lymphatic: Aortic atherosclerosis with stable 3.1 cm infrarenal aneurysmal dilatation. No enlarged abdominal or pelvic lymph nodes. Reproductive: Multiple prostate radiation implantation seeds are seen. Other: No abdominal wall hernia or abnormality. No abdominopelvic ascites. Musculoskeletal: Marked severity chronic and multilevel degenerative changes are seen throughout the lumbar spine. IMPRESSION: 1. Bilateral 2 mm nonobstructing renal stones. 2. Numerous bilateral partially calcified and noncalcified renal cysts. 3. Diffuse urinary bladder wall thickening which is seen on the prior study and may represent sequelae associated with cystitis. 4. Multiple prostate radiation implantation seeds. 5. Aortic atherosclerosis. Aortic Atherosclerosis (ICD10-I70.0). Electronically Signed   By: Virgina Norfolk M.D.   On: 02/23/2020 21:09   DG Abdomen 1 View  Result Date: 02/04/2020 CLINICAL DATA:  Weakness, no bowel movement for 1 week EXAM: ABDOMEN - 1 VIEW COMPARISON:  September 20, 2019 FINDINGS: Brachytherapy seeds in the prostate. Surgical clips in the RIGHT upper quadrant compatible with prior cholecystectomy. Upper abdomen excluded from view.  Stomach with mild distension. Gas and stool in the  rectum. Question of mild haustral thickening of the distal transverse colon. No signs of small bowel obstruction. Faint calcification overlies the LEFT abdomen more lateral than expected for ureteral calculus though ureteral calculus not excluded measuring 4 mm. Evidence of calcified atheromatous plaque in the abdominal aorta extending in the iliac vessels. On limited assessment no acute skeletal process. IMPRESSION: 1. Question of some haustral thickening of the distal transverse colon, findings could be seen in the setting of colitis. No sign of bowel obstruction. 2. Potential mid LEFT ureteral calculus. 3. Upper abdomen excluded from view 4. Signs of aortoiliac atherosclerosis. Electronically Signed   By: Zetta Bills M.D.   On: 03/01/2020 13:06   CT HEAD WO CONTRAST  Result Date: 02/08/2020 CLINICAL DATA:  Decreased level of consciousness EXAM: CT HEAD WITHOUT CONTRAST TECHNIQUE: Contiguous axial images were obtained from the base of the skull through the vertex without intravenous contrast. COMPARISON:  10/17/2018 FINDINGS: Brain: There is atrophy and chronic small vessel disease changes. No acute intracranial abnormality. Specifically, no hemorrhage, hydrocephalus, mass lesion, acute infarction, or significant intracranial injury. Vascular: No hyperdense vessel or unexpected calcification. Skull: No acute calvarial abnormality.  Sinuses/Orbits: No acute findings Other: None IMPRESSION: Atrophy, chronic microvascular disease. No acute intracranial abnormality. Electronically Signed   By: Rolm Baptise M.D.   On: 03/01/2020 14:05   US Abdomen Limited  Result Date: 02/21/2020 CLINICAL DATA:  Elevated LFTs EXAM: ULTRASOUND ABDOMEN LIMITED RIGHT UPPER QUADRANT COMPARISON:  None. FINDINGS: Gallbladder: The patient is status post cholecystectomy. No biliary ductal dilation. Common bile duct: Diameter: 1 cm Liver: No focal lesion identified. Within normal limits in parenchymal echogenicity. Portal vein is  patent on color Doppler imaging with normal direction of blood flow towards the liver. Other: Small amount of perihepatic ascites is seen. IMPRESSION: Status post cholecystectomy. Mild prominence of the common bile duct which may be due to postsurgical dilation, no definite shadowing stones are seen. Small amount of perihepatic ascites. Electronically Signed   By: Prudencio Pair M.D.   On: 02/25/2020 19:53   DG Chest Portable 1 View  Result Date: 01/31/2020 CLINICAL DATA:  Weakness for 1 week, no bowel movements for 1 week EXAM: PORTABLE CHEST 1 VIEW COMPARISON:  March 29, 2019 FINDINGS: Trachea midline. Heart size remains enlarged. RIGHT-sided pacer device, 2 lead with power pack over the RIGHT chest with similar appearance. Engorgement of central pulmonary vasculature. No lobar consolidation or pleural effusion. On limited assessment no acute skeletal process. IMPRESSION: Cardiomegaly with vascular congestion. Electronically Signed   By: Zetta Bills M.D.   On: 02/25/2020 13:03    Impression: Liver failure, coagulopathy and encephalopathy.  Presentation most consistent with ischemic hepatitis and subacute liver failure.  Patient with multiple episodes of syncope since Friday 12/3.  Heart rate documented as 31 and 38 between 3-3:25 AM 12/7.  Additionally, BP of 75/51 on 12/6 at 4:25 PM. -Today, T bili 1.6/AST 1382/ALT 860, worsened from 2 bili 1.5/AST 578/ALT 439 yesterday.  ALP remains normal.  In 08/2019, patient had normal T. bili/AST/ALT with elevated ALP. -PT 26.7/INR 2.6 today, increased from yesterday PT 22.9/INR 2.1 -Acute hepatitis panel negative 12/6  Leukocytosis, unclear etiology. WBCs 16.1 today, 15.8 yesterday.  Chronic calcific pancreatitis  S/P cholecystectomy. EUS 10/19/19 with pancreatic stones and normal CBD.  Atherosclerosis of celiac trunk, common hepatic, and celiac artery noted.  ESRD on HD  Plan: Ordered CK to rule out rhabdomyolysis.  Autoimmune, ceruloplasmin, alpha-1  anti-trypsin to rule out underlying causes of chronic liver disease.  Start lactulose enemas BID, since patient is currently NPO due to failed swallowing evaluation at bedside.  Speech to be consulted.  Once patient able to tolerate PO intake, can transition to diet accordingly, as well as PO lactulose and Xifaxan.  Vitamin K 10 mg x 1 ordered due to coagulopathy.  Continue to trend LFTs and PT/INR.  [facility] will follow.   LOS: 1 day   Salley Slaughter  PA-C 02/06/2020, 10:52 AM  Contact #  [phone]

## 2020-02-06 NOTE — Progress Notes (Signed)
Hemodialysis- Patient confused but able to tell me he is in "dialysis in the hospital in [place]". All labs and orders reviewed. Labs drawn and sent. Telemetry notified of transfer. Continue to monitor.

## 2020-02-06 NOTE — Consult Note (Signed)
[facility] Consultation Note  Indication for Consultation:  Management of ESRD/hemodialysis; anemia, hypertension/volume and secondary hyperparathyroidism  HPI: Sean Best is a 86 y.o. male with ESRD due to DM, started HD in 03/2013 ho  - HTN, Hx CVA, Hx prostate cancer, gout. - [facility] admit 2/16-2/22/19 with 3rd degree HB, s/p PPM by Dr. Lovena Le, hypoxemic resp fx, severe pulm HTN, shock liver, Mod AS; EDW lowered. BB stopped.  - [facility] admit 12/12-15/19 Pulm edema/ NEW CAD Dx  Pta/stent Cx x 2 / RCAD / mild mod AS/  edw decr. - [facility] admit 2/26-2/29/20 with syncope, EDW [facility] admit 8/15-8/18/20 AMS, ^ HTN - hydralazine resumed. - [facility] admit 7/21-7/27/21 with acute pancreatitis, acute GI bleed. s/p PRBC's x 1 unit. for ERCP and EUS w/Dr. Paulita Fujita as outpt. LF  Now admitted with altered mental status, family/daughter reporting progressive weakness and fatigue and now confusion at home, right foot wound being seen by primary and also Dr. Donnetta Hutching with [facility]..  Noted he missed hemodialysis yesterday secondary to his symptoms  In the ER noted soft BP 76-72 systolic improved with 094 systolic, hyperglycemia glucose 33 requiring D10 drip, LFTs elevated AST 578, ALT 439 ammonia 77 EtOH negative INR 2.1 PT 24 PTT 44 WBC 15.8 Hgb 13.0, K4.6 BUN 33 creatinine 7.59 Chest x-ray cardiomegaly with some vascular congestion, CT abdomen no acute finding CT scan head no acute intracranial abnormality chronic microvascular disease atrophy Ultrasound negative for CBD stone or increased biliary dilation Past Medical History:  Diagnosis Date  . Anemia   . Arthritis    "knees" (04/11/2016)  . Blind left eye    S/P trauma  . CHF (congestive heart failure) ([place])   . Chronic total occlusion of artery of the extremities (HCC)    pt not aware of this  . Claudication ([place])   . ESRD on dialysis [facility])    "MWF; [name]" ((04/10/2016)  . ESRD on peritoneal dialysis [facility])    Started dialysis around April  2015 per son.  Has been doing peritoneal dialysis at home.     . Gout   . Hyperlipidemia   . Hypertension   . Overweight(278.02)   . Presence of permanent cardiac pacemaker    medtronic  . Prostate cancer ([place]) 1990s  . Shingles   . Stroke [facility]) ~1998   denies residual on 04/11/2016  . Type II diabetes mellitus (HCC)    diet controlled  . Umbilical hernia 09/07/6281    Past Surgical History:  Procedure Laterality Date  . A/V FISTULAGRAM N/A 11/01/2018   Procedure: A/V FISTULAGRAM;  Surgeon: Serafina [place], MD;  Location: [place] [facility];  Service: Cardiovascular;  Laterality: N/A;  . AV FISTULA PLACEMENT, RADIOCEPHALIC  66/29/4765   Left arm  . CAPD INSERTION N/A 03/06/2013   Procedure: LAPAROSCOPIC INSERTION CONTINUOUS AMBULATORY PERITONEAL DIALYSIS  (CAPD) CATHETER;  Surgeon: Edward Jolly, MD;  Location: [place];  Service: General;  Laterality: N/A;  . CATARACT EXTRACTION Right   . CHOLECYSTECTOMY  12/02/2017   LAPROSCOPIC  . CORONARY STENT INTERVENTION N/A 02/11/2018   Procedure: CORONARY STENT INTERVENTION;  Surgeon: Sherren Mocha, MD;  Location: [place] [facility];  Service: Cardiovascular;  Laterality: N/A;  . ESOPHAGOGASTRODUODENOSCOPY (EGD) WITH PROPOFOL N/A 10/18/2019   Procedure: ESOPHAGOGASTRODUODENOSCOPY (EGD) WITH PROPOFOL;  Surgeon: Arta Silence, MD;  Location: [facility];  Service: Endoscopy;  Laterality: N/A;  . EUS N/A 10/18/2019   Procedure: FULL UPPER ENDOSCOPIC ULTRASOUND (EUS) RADIAL;  Surgeon: Paulita Fujita,  Gwyndolyn Saxon, MD;  Location: [facility];  Service: Endoscopy;  Laterality: N/A;  . EYE SURGERY Left 1970s   for eye injury  . FISTULA SUPERFICIALIZATION Left 65/11/9355   Procedure: PLICATION OF LEFT RADIAL CEPHALIC ANEURYSM;  Surgeon: Rosetta Posner, MD;  Location: [place];  Service: Vascular;  Laterality: Left;  . HERNIA REPAIR    . INSERTION PROSTATE RADIATION SEED    . LAPAROSCOPIC CHOLECYSTECTOMY SINGLE SITE WITH INTRAOPERATIVE CHOLANGIOGRAM N/A  12/02/2017   Procedure: LAPAROSCOPIC CHOLECYSTECTOMY;  Surgeon: Michael [place], MD;  Location: [place];  Service: General;  Laterality: N/A;  . LEFT HEART CATH AND CORONARY ANGIOGRAPHY N/A 02/11/2018   Procedure: LEFT HEART CATH AND CORONARY ANGIOGRAPHY;  Surgeon: Sherren Mocha, MD;  Location: [place] [facility];  Service: Cardiovascular;  Laterality: N/A;  . PACEMAKER IMPLANT N/A 04/20/2017   Procedure: PACEMAKER IMPLANT;  Surgeon: Evans Lance, MD;  Location: [place] [facility];  Service: Cardiovascular;  Laterality: N/A;  . PERIPHERAL VASCULAR BALLOON ANGIOPLASTY  11/01/2018   Procedure: PERIPHERAL VASCULAR BALLOON ANGIOPLASTY;  Surgeon: Serafina [place], MD;  Location: [place] [facility];  Service: Cardiovascular;;  Lt lower arm fistula   . UMBILICAL HERNIA REPAIR N/A 03/06/2013   Procedure: HERNIA REPAIR UMBILICAL WITH MESH;  Surgeon: Edward Jolly, MD;  [facility];  Service: General;  Laterality: N/A;      Family History  Problem Relation Age of Onset  . Cancer Mother   . Heart disease Mother   . Cancer Father       reports that he has never smoked. He has never used smokeless tobacco. He reports that he does not drink alcohol and does not use drugs.   Allergies  Allergen Reactions  . Cardura [Doxazosin Mesylate] Other (See Comments)    Hallucinations  . Tape Dermatitis    Paper tape ok to use     Prior to Admission medications   Medication Sig Start Date End Date Taking? Authorizing Provider  acetaminophen (TYLENOL) 500 MG tablet Take 1,000 mg by mouth every 6 (six) hours as needed for mild pain or headache.   Yes [provider]  amLODipine (NORVASC) 5 MG tablet Take 5 mg by mouth daily. 08/18/19  Yes [provider]  aspirin 81 MG chewable tablet Chew 1 tablet (81 mg total) by mouth daily. 02/14/18  Yes Oretha Milch D, MD  atorvastatin (LIPITOR) 40 MG tablet Take 40 mg by mouth every evening.  07/07/17  Yes [provider]  calcium acetate  (PHOSLO) 667 MG capsule Take 3 capsules (2,001 mg total) by mouth 3 (three) times daily with meals. 04/30/18  Yes Nita Sells, MD  chlorhexidine (HIBICLENS) 4 % external liquid Apply topically daily as needed. Put 2 tablespoons in a basin of warm water and soak your foot for the next 7 days Patient taking differently: Apply 1 application topically daily as needed (foot blister). Put 2 tablespoons in a basin of warm water and soak your foot for the next 7 days 01/24/20  Yes Montine Circle, PA-C  cinacalcet [facility]) 90 MG tablet Take 90 mg by mouth every Monday, Wednesday, and Friday.    Yes [provider]  ELDERBERRY PO Take 1 capsule by mouth daily. Gummie    Yes [provider]  ferrous sulfate 325 (65 FE) MG EC tablet Take 325 mg by mouth daily with breakfast.    Yes [provider]  lidocaine-prilocaine (EMLA) cream Apply 1 application topically as needed (for port access).  10/28/17  Yes [provider]  metoprolol tartrate (LOPRESSOR) 25 MG tablet Take 0.5 tablets (12.5 mg total) by mouth 2 (two) times daily. 09/26/19  Yes Allie Bossier, MD  nitroGLYCERIN (NITROSTAT) 0.4 MG SL tablet Place 1 tablet (0.4 mg total) under the tongue every 5 (five) minutes as needed for chest pain. 04/18/19  Yes Leonie Man, MD  traMADol (ULTRAM) 50 MG tablet Take 1 tablet (50 mg total) by mouth 2 (two) times daily as needed for moderate pain. 01/30/20  Yes Dagoberto Ligas, PA-C  VELPHORO 500 MG chewable tablet Chew 500 mg by mouth 3 (three) times daily with meals.  01/28/18  Yes [provider]  VITAMIN D PO Take 1 capsule by mouth daily.  01/05/20 01/03/21 Yes [provider]  Blood Glucose Calibration (TRUE METRIX LEVEL 2) Normal SOLN  09/14/19   [provider]  Blood Glucose Monitoring Suppl (TRUE METRIX METER) w/Device KIT  09/14/19   [provider]  calcitRIOL (ROCALTROL) 0.5 MCG capsule Take 2 capsules (1 mcg total) by mouth  every Monday, Wednesday, and Friday with hemodialysis. 09/27/19   Allie Bossier, MD  Darbepoetin Alfa (ARANESP) 60 MCG/0.3ML SOSY injection Inject 0.3 mLs (60 mcg total) into the vein every Thursday with hemodialysis. Patient taking differently: Inject 60 mcg into the vein every 7 (seven) days. On Fridays 09/28/19   Allie Bossier, MD  Methoxy PEG-Epoetin Beta (MIRCERA IJ) Mircera 10/11/19 10/09/20  [provider]  oxyCODONE-acetaminophen (PERCOCET) 5-325 MG tablet Take 1 tablet by mouth every 6 (six) hours as needed for severe pain. Patient not taking: Reported on 02/06/2020 01/02/20   Rosetta Posner, MD  TRUE METRIX BLOOD GLUCOSE TEST test strip SMARTSIG:Via Meter 09/14/19   [provider]  TRUEplus Lancets 30G MISC  09/14/19   [provider]    PRN:  Results for orders placed or performed during the hospital encounter of 01/31/2020 (from the past 48 hour(s))  CBG monitoring, ED     Status: Abnormal   Collection Time: 02/15/2020 12:12 PM  Result Value Ref Range   Glucose-Capillary 61 (L) 70 - 99 mg/dL    Comment: Glucose reference range applies only to samples taken after fasting for at least 8 hours.  Ethanol     Status: None   Collection Time: 02/01/2020  1:12 PM  Result Value Ref Range   Alcohol, Ethyl (B) <10 <10 mg/dL    Comment: (NOTE) Lowest detectable limit for serum alcohol is 10 mg/dL.  For medical purposes only. Performed at [facility], [place] [address], [place], [place] [address]   Protime-INR     Status: Abnormal   Collection Time: 02/06/2020  1:12 PM  Result Value Ref Range   Prothrombin Time 22.9 (H) 11.4 - 15.2 seconds   INR 2.1 (H) 0.8 - 1.2    Comment: (NOTE) INR goal varies based on device and disease states. Performed at [facility], [place] [address] [place], [place] [address]   APTT     Status: Abnormal   Collection Time: 02/16/2020  1:12 PM  Result Value Ref Range   aPTT 44 (H) 24 - 36 seconds    Comment:        IF BASELINE  aPTT IS ELEVATED, SUGGEST PATIENT RISK ASSESSMENT BE USED TO DETERMINE APPROPRIATE ANTICOAGULANT THERAPY. Performed at [facility], [place] [address] [place], [place] [address]   CBC     Status: Abnormal   Collection Time: 02/18/2020  1:12 PM  Result Value Ref Range   WBC 15.8 (H) 4.0 - 10.5 K/uL   RBC 5.02 4.22 - 5.81 MIL/uL   Hemoglobin 13.0 13.0 - 17.0 g/dL   HCT 44.1 39 - 52 %   MCV 87.8 80.0 - 100.0 fL   MCH 25.9 (L) 26.0 - 34.0 pg   MCHC 29.5 (L) 30.0 - 36.0 g/dL   RDW 21.3 (H) 11.5 - 15.5 %   Platelets 225 150 - 400 K/uL   nRBC 0.0 0.0 - 0.2 %    Comment: Performed at [place] [address], [place], [place] [address]  Differential     Status: Abnormal   Collection Time: 02/12/2020  1:12 PM  Result Value Ref Range   Neutrophils Relative % 90 %   Neutro Abs 14.2 (H) 1.7 - 7.7 K/uL   Lymphocytes Relative 5 %   Lymphs Abs 0.8 0.7 - 4.0 K/uL   Monocytes Relative 5 %   Monocytes Absolute 0.8 0.1 - 1.0 K/uL   Eosinophils Relative 0 %   Eosinophils Absolute 0.0 0.0 - 0.5 K/uL   Basophils Relative 0 %   Basophils Absolute 0.0 0.0 - 0.1 K/uL   nRBC 0 0 /100 WBC   Abs Immature Granulocytes 0.00 0.00 - 0.07 K/uL    Comment: Performed at [facility] [address] [place], [place] [address]  Comprehensive metabolic panel     Status: Abnormal   Collection Time: 02/10/2020  1:12 PM  Result Value Ref Range   Sodium 136 135 - 145 mmol/L   Potassium 4.6 3.5 - 5.1 mmol/L   Chloride 89 (L) 98 - 111 mmol/L   CO2 22 22 - 32 mmol/L   Glucose, Bld 79 70 - 99 mg/dL    Comment: Glucose reference range applies only to samples taken after fasting for at least 8 hours.   BUN 33 (H) 8 - 23 mg/dL   Creatinine, Ser 7.59 (H) 0.61 - 1.24 mg/dL   Calcium 8.8 (L) 8.9 - 10.3 mg/dL   Total Protein 5.9 (L) 6.5 - 8.1 g/dL   Albumin 3.1 (L) 3.5 - 5.0 g/dL   AST 578 (H) 15 - 41 U/L   ALT 439 (H) 0 - 44 U/L   Alkaline Phosphatase 126 38 - 126 U/L   Total Bilirubin 1.5 (H) 0.3 -  1.2 mg/dL   GFR, Estimated 7 (L) >60 mL/min    Comment: (NOTE) Calculated using the CKD-EPI Creatinine Equation (2021)    Anion gap 25 (H) 5 - 15    Comment: Performed at [facility], [place] [address] [place], [place] [address]  Blood culture (routine x 2)     Status: None (Preliminary result)   Collection Time: 01/31/2020  1:13 PM   Specimen: BLOOD  Result Value Ref Range   Specimen Description BLOOD SITE NOT SPECIFIED    Special Requests AEROBIC BOTTLE ONLY Blood Culture adequate volume    Culture      NO GROWTH < 24 HOURS Performed at [facility], [place] [address], [place], [place] [address]    Report Status PENDING   Blood culture (routine x 2)     Status: None (Preliminary result)   Collection Time: 02/23/2020  1:15 PM   Specimen: BLOOD  Result Value Ref Range   Specimen Description BLOOD SITE NOT SPECIFIED    Special Requests AEROBIC BOTTLE ONLY Blood Culture adequate volume    Culture  NO GROWTH < 24 HOURS Performed at [place] [address], [place], [place] [address]    Report Status PENDING   Resp Panel by RT-PCR (Flu A&B, Covid) Nasopharyngeal Swab     Status: None   Collection Time: 02/27/2020  1:54 PM   Specimen: Nasopharyngeal Swab; Nasopharyngeal(NP) swabs in vial transport medium  Result Value Ref Range   SARS Coronavirus 2 by RT PCR NEGATIVE NEGATIVE    Comment: (NOTE) SARS-CoV-2 target nucleic acids are NOT DETECTED.  The SARS-CoV-2 RNA is generally detectable in upper respiratory specimens during the acute phase of infection. The lowest concentration of SARS-CoV-2 viral copies this assay can detect is 138 copies/mL. A negative result does not preclude SARS-Cov-2 infection and should not be used as the sole basis for treatment or other patient management decisions. A negative result may occur with  improper specimen collection/handling, submission of specimen other than nasopharyngeal swab, presence of viral mutation(s) within  the areas targeted by this assay, and inadequate number of viral copies(<138 copies/mL). A negative result must be combined with clinical observations, patient history, and epidemiological information. The expected result is Negative.  Fact Sheet for Patients:  EntrepreneurPulse.com.au  Fact Sheet for Healthcare Providers:  IncredibleEmployment.be  This test is no t yet approved or cleared by the [place] FDA and  has been authorized for detection and/or diagnosis of SARS-CoV-2 by FDA under an Emergency Use Authorization (EUA). This EUA will remain  in effect (meaning this test can be used) for the duration of the COVID-19 declaration under Section 564(b)(1) of the Act, 21 U.S.C.section 360bbb-3(b)(1), unless the authorization is terminated  or revoked sooner.       Influenza A by PCR NEGATIVE NEGATIVE   Influenza B by PCR NEGATIVE NEGATIVE    Comment: (NOTE) The Xpert Xpress SARS-CoV-2/FLU/RSV plus assay is intended as an aid in the diagnosis of influenza from Nasopharyngeal swab specimens and should not be used as a sole basis for treatment. Nasal washings and aspirates are unacceptable for Xpert Xpress SARS-CoV-2/FLU/RSV testing.  Fact Sheet for Patients: EntrepreneurPulse.com.au  Fact Sheet for Healthcare Providers: IncredibleEmployment.be  This test is not yet approved or cleared by the [place] FDA and has been authorized for detection and/or diagnosis of SARS-CoV-2 by FDA under an Emergency Use Authorization (EUA). This EUA will remain in effect (meaning this test can be used) for the duration of the COVID-19 declaration under Section 564(b)(1) of the Act, 21 U.S.C. section 360bbb-3(b)(1), unless the authorization is terminated or revoked.  Performed at [facility], [place] [address], [place], [place] [address]   CBG monitoring, ED     Status: Abnormal   Collection Time:  02/26/2020  4:25 PM  Result Value Ref Range   Glucose-Capillary 33 (LL) 70 - 99 mg/dL    Comment: Glucose reference range applies only to samples taken after fasting for at least 8 hours.  CBG monitoring, ED     Status: None   Collection Time: 02/29/2020  4:39 PM  Result Value Ref Range   Glucose-Capillary 97 70 - 99 mg/dL    Comment: Glucose reference range applies only to samples taken after fasting for at least 8 hours.  POC CBG, ED     Status: None   Collection Time: 02/01/2020  5:44 PM  Result Value Ref Range   Glucose-Capillary 97 70 - 99 mg/dL    Comment: Glucose reference range applies only to samples taken after fasting for at least 8 hours.  Comment 1 Notify RN   Ammonia     Status: Abnormal   Collection Time: 02/08/2020  5:48 PM  Result Value Ref Range   Ammonia 77 (H) 9 - 35 umol/L    Comment: Performed at [facility] [address], [place], [place] [address]  POC CBG, ED     Status: None   Collection Time: 02/25/2020  6:49 PM  Result Value Ref Range   Glucose-Capillary 84 70 - 99 mg/dL    Comment: Glucose reference range applies only to samples taken after fasting for at least 8 hours.  POC CBG, ED     Status: None   Collection Time: 02/02/2020  7:45 PM  Result Value Ref Range   Glucose-Capillary 93 70 - 99 mg/dL    Comment: Glucose reference range applies only to samples taken after fasting for at least 8 hours.  Hepatitis panel, acute     Status: None   Collection Time: 01/31/2020 10:55 PM  Result Value Ref Range   Hepatitis B Surface Ag NON REACTIVE NON REACTIVE   HCV Ab NON REACTIVE NON REACTIVE    Comment: (NOTE) Nonreactive HCV antibody screen is consistent with no HCV infections,  unless recent infection is suspected or other evidence exists to indicate HCV infection.     Hep A IgM NON REACTIVE NON REACTIVE   Hep B C IgM NON REACTIVE NON REACTIVE    Comment: Performed at [facility], [place] [address], [place], [place] [address]  CBG monitoring, ED      Status: None   Collection Time: 02/01/2020 11:48 PM  Result Value Ref Range   Glucose-Capillary 88 70 - 99 mg/dL    Comment: Glucose reference range applies only to samples taken after fasting for at least 8 hours.  Glucose, capillary     Status: Abnormal   Collection Time: 02/06/20  7:04 AM  Result Value Ref Range   Glucose-Capillary 111 (H) 70 - 99 mg/dL    Comment: Glucose reference range applies only to samples taken after fasting for at least 8 hours.  Protime-INR     Status: Abnormal   Collection Time: 02/06/20  8:00 AM  Result Value Ref Range   Prothrombin Time 26.7 (H) 11.4 - 15.2 seconds   INR 2.6 (H) 0.8 - 1.2    Comment: (NOTE) INR goal varies based on device and disease states. Performed at [facility], [place] [address], [place], [place] [address]   CBC     Status: Abnormal   Collection Time: 02/06/20  8:00 AM  Result Value Ref Range   WBC 16.1 (H) 4.0 - 10.5 K/uL   RBC 4.77 4.22 - 5.81 MIL/uL   Hemoglobin 12.8 (L) 13.0 - 17.0 g/dL   HCT 40.5 39 - 52 %   MCV 84.9 80.0 - 100.0 fL   MCH 26.8 26.0 - 34.0 pg   MCHC 31.6 30.0 - 36.0 g/dL   RDW 21.2 (H) 11.5 - 15.5 %   Platelets 171 150 - 400 K/uL   nRBC 0.1 0.0 - 0.2 %    Comment: Performed at [facility], [place] [address] [place],  [address]  Comprehensive metabolic panel     Status: Abnormal   Collection Time: 02/06/20  8:00 AM  Result Value Ref Range   Sodium 137 135 - 145 mmol/L   Potassium 4.7 3.5 - 5.1 mmol/L   Chloride 89 (L) 98 - 111 mmol/L   CO2 21 (L)  22 - 32 mmol/L   Glucose, Bld 111 (H) 70 - 99 mg/dL    Comment: Glucose reference range applies only to samples taken after fasting for at least 8 hours.   BUN 38 (H) 8 - 23 mg/dL   Creatinine, Ser 8.57 (H) 0.61 - 1.24 mg/dL   Calcium 8.8 (L) 8.9 - 10.3 mg/dL   Total Protein 5.9 (L) 6.5 - 8.1 g/dL   Albumin 2.9 (L) 3.5 - 5.0 g/dL   AST 1,382 (H) 15 - 41 U/L   ALT 860 (H) 0 - 44 U/L   Alkaline Phosphatase 132 (H) 38 - 126 U/L   Total  Bilirubin 1.6 (H) 0.3 - 1.2 mg/dL   GFR, Estimated 6 (L) >60 mL/min    Comment: (NOTE) Calculated using the CKD-EPI Creatinine Equation (2021)    Anion gap 27 (H) 5 - 15    Comment: Performed at [facility], [place] [address], [place], [place] [address]  Ammonia     Status: Abnormal   Collection Time: 02/06/20  8:00 AM  Result Value Ref Range   Ammonia 49 (H) 9 - 35 umol/L    Comment: Performed at [facility], [place] [address] [place], [place] [address]     ROS: See HPI   Physical Exam: Vitals:   02/06/20 0630 02/06/20 0939  BP: 107/88 123/88  Pulse: 78 85  Resp:  19  Temp: 98.1 F (36.7 C) 98.2 F (36.8 C)  SpO2: 95% 90%     General: Alert but somewhat pleasantly confused elderly male chronically ill-appearing in NAD HEENT: [place], nonicteric, PERRLA, MMM moist Neck: No JVD Heart: RRR, no MRG Lungs: CTA laterally.  No rales wheezes or rhonchi, nonlabored breathing Abdomen: Bowel sounds normoactive, soft nontender nondistended, no ascites  Extremities: Bilateral pedal edema with blistering type right foot toe wounds dry superficial type skin ulcerations, palpable femoral pulses but not popliteal or distal pulses bilaterally (noted picture taken by admit physician) Skin: Right toe wound as noted above otherwise warm and dry Neuro: Alert, pleasantly confused almost baseline moves all extremities to commands Dialysis Access: Positive bruit left forearm AV fistula  Dialysis Orders: [facility] MWF, 4 hours, 78 kg EDW, 2K, 2 calcium bath, UF profile 2, left forearm AV fistula access, no heparin, no ESA, calcitriol 0.75 mcg p.o. daily  Assessment/Plan  1. ESRD -HD on MWF schedule plan for dialysis today with short course secondary to scheduling, back on MWF schedule tomorrow 2. Encephalopathy = elevated ammonia, elevated LFTs , work-up per admit and GI consulted known history of pancreatitis, hyperglycemia 33 in ER requiring D10  drip 3. Hypertension/volume  -patient hypotensive in ER initially, now on floor 107/88, chest x-ray some vascular ingestion, mild volume on exam lower extremities edema with missed dialysis yesterday 3 hours today and in 3 hours tomorrow minimal UF as tolerated 4. Anemia  -Hgb 12.8 no ESA 5. Metabolic bone disease -phosphate binders with p.o.'s diet calcitriol and hemodialysis follow-up calcium phosphorus trend 6. Nutrition -Albumin 2.9 carb modified diet protein supplement 7. Right lower extremity foot wounds per Dr. Luther Parody team noted patient and daughter refused angiogram at this time and less wounds worsen, placed on IV antibiotics wound care 8. Liver failure with coagulopathy encephalopathy= GI seeing work-up in progress and lactulose enemas twice daily patient is n.p.o. evaluate swallowing, vitamin K for coagulopathy, will hold heparin on hemodialysis 9. Diabetes mellitus type 2 glycemic on admission= plan per admit team to currently home D10  drip  Ernest Haber, PA-C [employer] [phone] 02/06/2020, 9:43 AM

## 2020-02-06 NOTE — Consult Note (Signed)
WOC Nurse Consult Note: Patient receiving care in [facility] 5M11.  Daughter, [name], in room at time of my visit. Reason for Consult: right foot and toes Wound type: highly likely related to arterial insufficiency.  [name] relates that he was seen by Dr. Donnetta Hutching last week and only 2 of 3 arteries could be heard at the time of assessment.  Both feet are cold to touch today. Pressure Injury POA: Yes/No/NA Measurement: The right great toe wound measures 4 cm x 4 cm.  See photo for details of areas Wound bed: dry, darkened, maroon in hue. Drainage (amount, consistency, odor)  none Periwound: intact Dressing procedure/placement/frequency:  Cleanse right foot and toes with CHG wipes. Then apply iodine from the swabsticks or swab pads from clean utility and allow to air dry. [name] relates that she was told to wash the areas with Dial soap, which was changed to Hibiclens, and apply betadine.  I have made unit stock substitutions for this approach.  Monitor the wound area(s) for worsening of condition such as: Signs/symptoms of infection,  Increase in size,  Development of or worsening of odor, Development of pain, or increased pain at the affected locations.  Notify the medical team if any of these develop.  Thank you for the consult.  Discussed plan of care with the patient's daughter D[place].  [facility] nurse will not follow at this time.  Please re-consult the [place] team if needed.  Val Riles, RN, MSN, CWOCN, CNS-BC, pager [phone]

## 2020-02-06 NOTE — Progress Notes (Signed)
New Admission Note:   Arrival Method: Stretcher Mental Orientation: alert time 2 Telemetry: box 6 Assessment: Completed Skin: see flowsheet IV: NSL Pain: none Tubes: none Safety Measures: Safety Fall Prevention Plan has been discussed Admission: Completed [facility] Orientation: Patient has been orientated to the room, unit and staff.  Family: none at beside  Orders have been reviewed and implemented. Will continue to monitor the patient. Call light has been placed within reach and bed alarm has been activated.   Rockie Neighbours BSN, RN Phone number: [phone]

## 2020-02-07 LAB — PROTIME-INR
INR: 2.4 — ABNORMAL HIGH (ref 0.8–1.2)
Prothrombin Time: 24.9 seconds — ABNORMAL HIGH (ref 11.4–15.2)

## 2020-02-07 LAB — COMPREHENSIVE METABOLIC PANEL
ALT: 1219 U/L — ABNORMAL HIGH (ref 0–44)
AST: 1490 U/L — ABNORMAL HIGH (ref 15–41)
Albumin: 2.6 g/dL — ABNORMAL LOW (ref 3.5–5.0)
Alkaline Phosphatase: 136 U/L — ABNORMAL HIGH (ref 38–126)
Anion gap: 20 — ABNORMAL HIGH (ref 5–15)
BUN: 23 mg/dL (ref 8–23)
CO2: 23 mmol/L (ref 22–32)
Calcium: 8.3 mg/dL — ABNORMAL LOW (ref 8.9–10.3)
Chloride: 94 mmol/L — ABNORMAL LOW (ref 98–111)
Creatinine, Ser: 5.48 mg/dL — ABNORMAL HIGH (ref 0.61–1.24)
GFR, Estimated: 10 mL/min — ABNORMAL LOW (ref >60)
Glucose, Bld: 88 mg/dL (ref 70–99)
Potassium: 3.8 mmol/L (ref 3.5–5.1)
Sodium: 137 mmol/L (ref 135–145)
Total Bilirubin: 1.6 mg/dL — ABNORMAL HIGH (ref 0.3–1.2)
Total Protein: 5.3 g/dL — ABNORMAL LOW (ref 6.5–8.1)

## 2020-02-07 LAB — IRON AND TIBC
Iron: 22 ug/dL — ABNORMAL LOW (ref 45–182)
Saturation Ratios: 9 % — ABNORMAL LOW (ref 17.9–39.5)
TIBC: 234 ug/dL — ABNORMAL LOW (ref 250–450)
UIBC: 212 ug/dL

## 2020-02-07 LAB — GLUCOSE, CAPILLARY
Glucose-Capillary: 74 mg/dL (ref 70–99)
Glucose-Capillary: 110 mg/dL — ABNORMAL HIGH (ref 70–99)
Glucose-Capillary: 109 mg/dL — ABNORMAL HIGH (ref 70–99)

## 2020-02-07 LAB — CBC
HCT: 37.8 % — ABNORMAL LOW (ref 39.0–52.0)
Hemoglobin: 12.1 g/dL — ABNORMAL LOW (ref 13.0–17.0)
MCH: 26.8 pg (ref 26.0–34.0)
MCHC: 32 g/dL (ref 30.0–36.0)
MCV: 83.6 fL (ref 80.0–100.0)
Platelets: 134 10*3/uL — ABNORMAL LOW (ref 150–400)
RBC: 4.52 MIL/uL (ref 4.22–5.81)
RDW: 21.2 % — ABNORMAL HIGH (ref 11.5–15.5)
WBC: 16.5 10*3/uL — ABNORMAL HIGH (ref 4.0–10.5)
nRBC: 0.1 % (ref 0.0–0.2)

## 2020-02-07 LAB — FERRITIN: Ferritin: 187 ng/mL (ref 24–336)

## 2020-02-07 LAB — MITOCHONDRIAL ANTIBODIES: Mitochondrial M2 Ab, IgG: <20 Units (ref 0.0–20.0)

## 2020-02-07 LAB — AMMONIA: Ammonia: 33 umol/L (ref 9–35)

## 2020-02-07 LAB — PROCALCITONIN: Procalcitonin: 1.96 ng/mL

## 2020-02-07 LAB — ACETAMINOPHEN LEVEL: Acetaminophen (Tylenol), Serum: <10 ug/mL — ABNORMAL LOW (ref 10–30)

## 2020-02-07 MED ORDER — VANCOMYCIN HCL 750 MG/150ML IV SOLN
750.0000 mg | Freq: Once | INTRAVENOUS | Status: AC
  Administered 2020-02-07: 750 mg via INTRAVENOUS
  Filled 2020-02-07: qty 150

## 2020-02-07 MED ORDER — VANCOMYCIN VARIABLE DOSE PER UNSTABLE RENAL FUNCTION (PHARMACIST DOSING): Status: DC

## 2020-02-07 MED ORDER — DEXTROSE 10 % IV SOLN: INTRAVENOUS | Status: DC

## 2020-02-07 MED ORDER — PHYTONADIONE 5 MG PO TABS
10.0000 mg | ORAL_TABLET | Freq: Once | ORAL | Status: AC
  Administered 2020-02-07: 10 mg via ORAL
  Filled 2020-02-07: qty 2

## 2020-02-07 NOTE — Progress Notes (Signed)
Sean So, RN called and notifiedthe pt HD treatment has been moved to 02/08/20.

## 2020-02-07 NOTE — Progress Notes (Addendum)
Subjective: Seen in room, and examined daughter at bedside reports slightly better confusion not back to baseline yet still cannot tell me where he is at.  For dialysis today back on schedule at dialysis yesterday to missing Monday hemodialysis Objective Vital signs in last 24 hours: Vitals:   02/06/20 1714 02/06/20 2036 02/07/20 0501 02/07/20 1103  BP: (!) 117/51 102/72 114/71 109/73  Pulse: 81 91 (!) 57 73  Resp: 17 18 16    Temp: 98.2 F (36.8 C) 99.6 F (37.6 C) 99.7 F (37.6 C) (!) 97.4 F (36.3 C)  TempSrc: Oral Oral Oral Oral  SpO2: 94% 95% 97%   Weight:  82.8 kg    Height:       Weight change: -24.1 kg  Physical Exam: General: Alert and no change in confusion , can identify daughter at bedside does not know where he is currently in NAD Heart: RRR, no MRG Lungs: CTA laterally, nonlabored breathing Abdomen: Bowel sounds normoactive, soft nontender nondistended, no ascites  Extremities:  Trace bilateral pedal edema right foot wound wrapped dressing dry clean, (noted picture taken by admit physician) Dialysis Access: Positive bruit left forearm AV fistula  Dialysis Orders: [facility] MWF, 4 hours, 78 kg EDW, 2K, 2 calcium bath, UF profile 2, left forearm AV fistula access, no heparin, no ESA, calcitriol 0.75 mcg p.o. daily  Problem/Plan:  1. ESRD -HD on MWF schedule plan for dialysis today  back on MWF , dialysis yesterday after missing his Monday dialysis 2. Encephalopathy = elevated ammonia, elevated LFTs , work-up per admit and GI consulted known history of pancreatitis, hypoglycemia 33 in ER requiring D10 drip 3. Hypertension/volume  -patient hypotensive in ER initially, this a.m. 109/73, chest x-ray some vascular ingestion, mild volume on exam lower extremities edema with missed dialysis 12/06.scheduled for dialysis today schedule  UF as tolerated 4. Anemia  -Hgb 12.8> 12.1 no ESA 5. Metabolic bone disease -phosphate binders with p.o.'s diet  calcitriol and hemodialysis follow-up calcium phosphorus trend 6. Nutrition -Albumin 2.9 >2.6 noted pured diet per speech / protein supplement 7. Right lower extremity foot wounds per Dr. Luther Parody team noted patient and daughter refused angiogram at this time and less wounds worsen, placed on IV antibiotics wound care 8. Liver failure with coagulopathy encephalopathy= GI seeing work-up in progress and lactulose enemas twice daily patient is n.p.o. evaluate swallowing, vitamin K for coagulopathy, will hold heparin on hemodialysis 9. Diabetes mellitus type 2 glycemic on admission= plan per admit team to currently home D10 drip at 36ml/hr   Ernest Haber, PA-C [place] [phone] 02/07/2020,12:11 PM  LOS: 2 days   Labs: Basic Metabolic Panel: Recent Labs  Lab 02/25/2020 1312 02/06/20 0800 02/07/20 0507  NA 136 137 137  K 4.6 4.7 3.8  CL 89* 89* 94*  CO2 22 21* 23  GLUCOSE 79 111* 88  BUN 33* 38* 23  CREATININE 7.59* 8.57* 5.48*  CALCIUM 8.8* 8.8* 8.3*   Liver Function Tests: Recent Labs  Lab 02/11/2020 1312 02/06/20 0800 02/07/20 0507  AST 578* 1,382* 1,490*  ALT 439* 860* 1,219*  ALKPHOS 126 132* 136*  BILITOT 1.5* 1.6* 1.6*  PROT 5.9* 5.9* 5.3*  ALBUMIN 3.1* 2.9* 2.6*   No results for input(s): LIPASE, AMYLASE in the last 168 hours. Recent Labs  Lab 02/21/2020 1748 02/06/20 0800 02/07/20 0903  AMMONIA 77* 49* 33   CBC: Recent Labs  Lab 02/04/2020 1312 02/15/2020 1312 02/06/20 0800 02/06/20 1201 02/07/20 0507  WBC 15.8*   < >  16.1* 16.2* 16.5*  NEUTROABS 14.2*  --   --   --   --   HGB 13.0   < > 12.8* 11.8* 12.1*  HCT 44.1   < > 40.5 38.6* 37.8*  MCV 87.8  --  84.9 84.3 83.6  PLT 225   < > 171 164 134*   < > = values in this interval not displayed.   Cardiac Enzymes: Recent Labs  Lab 02/06/20 1042  CKTOTAL 182   CBG: Recent Labs  Lab 02/06/20 0704 02/06/20 1123 02/06/20 1416 02/07/20 0641 02/07/20 1206  GLUCAP 111* 98 85 74 110*     Studies/Results: CT ABDOMEN PELVIS WO CONTRAST  Result Date: 02/26/2020 CLINICAL DATA:  Abdominal distension with altered mental status and constipation. EXAM: CT ABDOMEN AND PELVIS WITHOUT CONTRAST TECHNIQUE: Multidetector CT imaging of the abdomen and pelvis was performed following the standard protocol without IV contrast. COMPARISON:  September 20, 2019 FINDINGS: Lower chest: No acute abnormality. Hepatobiliary: No focal liver abnormality is seen. Status post cholecystectomy. No biliary dilatation. Pancreas: Unremarkable. No pancreatic ductal dilatation or surrounding inflammatory changes. Spleen: Normal in size without focal abnormality. Adrenals/Urinary Tract: Adrenal glands are unremarkable. Kidneys are normal in size, without obstructing renal calculi or hydronephrosis. Numerous bilateral partially calcified and noncalcified cysts are seen within both kidneys kidneys. Bilateral 2 mm nonobstructing renal stones are seen. The urinary bladder is contracted and subsequently limited in evaluation. Diffuse thick uretero bladder wall thickening is suspected. This is seen on the prior study. Stomach/Bowel: Stomach is within normal limits. Appendix appears normal. No evidence of bowel wall thickening, distention, or inflammatory changes. Vascular/Lymphatic: Aortic atherosclerosis with stable 3.1 cm infrarenal aneurysmal dilatation. No enlarged abdominal or pelvic lymph nodes. Reproductive: Multiple prostate radiation implantation seeds are seen. Other: No abdominal wall hernia or abnormality. No abdominopelvic ascites. Musculoskeletal: Marked severity chronic and multilevel degenerative changes are seen throughout the lumbar spine. IMPRESSION: 1. Bilateral 2 mm nonobstructing renal stones. 2. Numerous bilateral partially calcified and noncalcified renal cysts. 3. Diffuse urinary bladder wall thickening which is seen on the prior study and may represent sequelae associated with cystitis. 4. Multiple prostate  radiation implantation seeds. 5. Aortic atherosclerosis. Aortic Atherosclerosis (ICD10-I70.0). Electronically Signed   By: Virgina Norfolk M.D.   On: 02/12/2020 21:09   DG Abdomen 1 View  Result Date: 02/24/2020 CLINICAL DATA:  Weakness, no bowel movement for 1 week EXAM: ABDOMEN - 1 VIEW COMPARISON:  September 20, 2019 FINDINGS: Brachytherapy seeds in the prostate. Surgical clips in the RIGHT upper quadrant compatible with prior cholecystectomy. Upper abdomen excluded from view.  Stomach with mild distension. Gas and stool in the rectum. Question of mild haustral thickening of the distal transverse colon. No signs of small bowel obstruction. Faint calcification overlies the LEFT abdomen more lateral than expected for ureteral calculus though ureteral calculus not excluded measuring 4 mm. Evidence of calcified atheromatous plaque in the abdominal aorta extending in the iliac vessels. On limited assessment no acute skeletal process. IMPRESSION: 1. Question of some haustral thickening of the distal transverse colon, findings could be seen in the setting of colitis. No sign of bowel obstruction. 2. Potential mid LEFT ureteral calculus. 3. Upper abdomen excluded from view 4. Signs of aortoiliac atherosclerosis. Electronically Signed   By: Zetta Bills M.D.   On: 02/08/2020 13:06   CT HEAD WO CONTRAST  Result Date: 02/26/2020 CLINICAL DATA:  Decreased level of consciousness EXAM: CT HEAD WITHOUT CONTRAST TECHNIQUE: Contiguous axial images were obtained from the base  of the skull through the vertex without intravenous contrast. COMPARISON:  10/17/2018 FINDINGS: Brain: There is atrophy and chronic small vessel disease changes. No acute intracranial abnormality. Specifically, no hemorrhage, hydrocephalus, mass lesion, acute infarction, or significant intracranial injury. Vascular: No hyperdense vessel or unexpected calcification. Skull: No acute calvarial abnormality. Sinuses/Orbits: No acute findings Other: None  IMPRESSION: Atrophy, chronic microvascular disease. No acute intracranial abnormality. Electronically Signed   By: Rolm Baptise M.D.   On: 02/23/2020 14:05   US Abdomen Limited  Result Date: 02/24/2020 CLINICAL DATA:  Elevated LFTs EXAM: ULTRASOUND ABDOMEN LIMITED RIGHT UPPER QUADRANT COMPARISON:  None. FINDINGS: Gallbladder: The patient is status post cholecystectomy. No biliary ductal dilation. Common bile duct: Diameter: 1 cm Liver: No focal lesion identified. Within normal limits in parenchymal echogenicity. Portal vein is patent on color Doppler imaging with normal direction of blood flow towards the liver. Other: Small amount of perihepatic ascites is seen. IMPRESSION: Status post cholecystectomy. Mild prominence of the common bile duct which may be due to postsurgical dilation, no definite shadowing stones are seen. Small amount of perihepatic ascites. Electronically Signed   By: Prudencio Pair M.D.   On: 02/12/2020 19:53   DG Chest Portable 1 View  Result Date: 02/26/2020 CLINICAL DATA:  Weakness for 1 week, no bowel movements for 1 week EXAM: PORTABLE CHEST 1 VIEW COMPARISON:  March 29, 2019 FINDINGS: Trachea midline. Heart size remains enlarged. RIGHT-sided pacer device, 2 lead with power pack over the RIGHT chest with similar appearance. Engorgement of central pulmonary vasculature. No lobar consolidation or pleural effusion. On limited assessment no acute skeletal process. IMPRESSION: Cardiomegaly with vascular congestion. Electronically Signed   By: Zetta Bills M.D.   On: 02/22/2020 13:03   Medications: . cefTRIAXone (ROCEPHIN)  IV Stopped (02/07/20 0325)  . dextrose 522 mL (02/10/2020 1640)  . vancomycin     . aspirin  81 mg Oral Daily  . calcitRIOL  1 mcg Oral Q M,W,F-HD  . calcium acetate  2,001 mg Oral TID WC  . Chlorhexidine Gluconate Cloth  6 each Topical Q0600  . cinacalcet  90 mg Oral Q M,W,F  . ferrous sulfate  325 mg Oral Q breakfast  . heparin  5,000 Units Subcutaneous  Q8H  . lactulose  10 g Oral TID  . metoprolol tartrate  12.5 mg Oral BID  . sodium chloride flush  3 mL Intravenous Q12H  . vancomycin variable dose per unstable renal function (pharmacist dosing)   Does not apply See admin instructions    Nephrology attending: Patient was seen and examined.  Chart reviewed.  I agree with assessment and plan as outlined above. Received dialysis yesterday, tolerated well.  Plan for regular dialysis today.  Liver enzymes elevated therefore GI is following.  He looks more alert awake today. Katheran James, MD [facility].

## 2020-02-07 NOTE — Evaluation (Signed)
Clinical/Bedside Swallow Evaluation Patient Details  Name: Sean Best MRN: [id number] Date of Birth: 1933/03/12  Today's Date: 02/07/2020 Time: SLP Start Time (ACUTE ONLY): 0900 SLP Stop Time (ACUTE ONLY): 0928 SLP Time Calculation (min) (ACUTE ONLY): 28 min  Past Medical History:  Past Medical History:  Diagnosis Date  . Anemia   . Arthritis    "knees" (04/11/2016)  . Blind left eye    S/P trauma  . CHF (congestive heart failure) ([place])   . Chronic total occlusion of artery of the extremities (HCC)    pt not aware of this  . Claudication ([place])   . ESRD on dialysis [facility])    "MWF; [name]" ((04/10/2016)  . ESRD on peritoneal dialysis [facility])    Started dialysis around April 2015 per son.  Has been doing peritoneal dialysis at home.     . Gout   . Hyperlipidemia   . Hypertension   . Overweight(278.02)   . Presence of permanent cardiac pacemaker    medtronic  . Prostate cancer ([place]) 1990s  . Shingles   . Stroke [facility]) ~1998   denies residual on 04/11/2016  . Type II diabetes mellitus (HCC)    diet controlled  . Umbilical hernia 2/63/3354   Past Surgical History:  Past Surgical History:  Procedure Laterality Date  . A/V FISTULAGRAM N/A 11/01/2018   Procedure: A/V FISTULAGRAM;  Surgeon: Serafina Mitchell, MD;  Location: [place] CV LAB;  Service: Cardiovascular;  Laterality: N/A;  . AV FISTULA PLACEMENT, RADIOCEPHALIC  56/25/6389   Left arm  . CAPD INSERTION N/A 03/06/2013   Procedure: LAPAROSCOPIC INSERTION CONTINUOUS AMBULATORY PERITONEAL DIALYSIS  (CAPD) CATHETER;  Surgeon: Edward Jolly, MD;  Location: [place];  Service: General;  Laterality: N/A;  . CATARACT EXTRACTION Right   . CHOLECYSTECTOMY  12/02/2017   LAPROSCOPIC  . CORONARY STENT INTERVENTION N/A 02/11/2018   Procedure: CORONARY STENT INTERVENTION;  Surgeon: Sherren Mocha, MD;  Location: [place] CV LAB;  Service: Cardiovascular;  Laterality: N/A;  . ESOPHAGOGASTRODUODENOSCOPY (EGD) WITH PROPOFOL N/A  10/18/2019   Procedure: ESOPHAGOGASTRODUODENOSCOPY (EGD) WITH PROPOFOL;  Surgeon: Arta Silence, MD;  Location: [facility];  Service: Endoscopy;  Laterality: N/A;  . EUS N/A 10/18/2019   Procedure: FULL UPPER ENDOSCOPIC ULTRASOUND (EUS) RADIAL;  Surgeon: Arta Silence, MD;  Location: [facility];  Service: Endoscopy;  Laterality: N/A;  . EYE SURGERY Left 1970s   for eye injury  . FISTULA SUPERFICIALIZATION Left 37/04/4285   Procedure: PLICATION OF LEFT RADIAL CEPHALIC ANEURYSM;  Surgeon: Rosetta Posner, MD;  Location: [place];  Service: Vascular;  Laterality: Left;  . HERNIA REPAIR    . INSERTION PROSTATE RADIATION SEED    . LAPAROSCOPIC CHOLECYSTECTOMY SINGLE SITE WITH INTRAOPERATIVE CHOLANGIOGRAM N/A 12/02/2017   Procedure: LAPAROSCOPIC CHOLECYSTECTOMY;  Surgeon: Michael Boston, MD;  Location: [place];  Service: General;  Laterality: N/A;  . LEFT HEART CATH AND CORONARY ANGIOGRAPHY N/A 02/11/2018   Procedure: LEFT HEART CATH AND CORONARY ANGIOGRAPHY;  Surgeon: Sherren Mocha, MD;  Location: [place] CV LAB;  Service: Cardiovascular;  Laterality: N/A;  . PACEMAKER IMPLANT N/A 04/20/2017   Procedure: PACEMAKER IMPLANT;  Surgeon: Evans Lance, MD;  Location: [place] CV LAB;  Service: Cardiovascular;  Laterality: N/A;  . PERIPHERAL VASCULAR BALLOON ANGIOPLASTY  11/01/2018   Procedure: PERIPHERAL VASCULAR BALLOON ANGIOPLASTY;  Surgeon: Serafina Mitchell, MD;  Location: [place] CV LAB;  Service: Cardiovascular;;  Lt lower arm fistula   . UMBILICAL HERNIA REPAIR N/A 03/06/2013  Procedure: HERNIA REPAIR UMBILICAL WITH MESH;  Surgeon: Edward Jolly, MD;  Location: [facility];  Service: General;  Laterality: N/A;   HPI:  86 year old with past medical history significant for GI bleed, status post cholecystectomy 2019, history of pancreatitis, anemia, aortic stenosis, heart block status post pacemaker, diabetes, ESRD on hemodialysis MWF, gout, history of CVA, hyperlipidemia, hypertension,  prostate cancer, CAD who presents with altered mental status.  Patient has had a week of increasing weakness and fatigue and now presents with confusion.  He has been constipated, decreased oral intake.  He has right foot wound that he has been caring at home for the last 3 weeks.     Assessment / Plan / Recommendation Clinical Impression  Pt presents with lethargy and poor ability to sustain attention to eating and drinking requiring total assist feeding and verbal cueing for attention. Pt also has moderate xerostomia and occasional throat clearing with sips of water. After oral care and denutre removal this improved somewaht, but BID oral care at least is needed. Pt is unable to complete mastication of solids at this time. Instructed daughter in benefit of oral care and hand over hand assist for feeding. Recommmend a puree diet and thin liquids. Will f/u for diet advancement as daughter states pt can typically masticate hamburgers and regular solids.  SLP Visit Diagnosis: Dysphagia, oropharyngeal phase (R13.12)    Aspiration Risk  Mild aspiration risk;Risk for inadequate nutrition/hydration    Diet Recommendation Dysphagia 1 (Puree);Thin liquid   Liquid Administration via: Cup;Straw Medication Administration: Crushed with puree Supervision: Staff to assist with self feeding;Full supervision/cueing for compensatory strategies Compensations: Slow rate;Small sips/bites Postural Changes: Seated upright at 90 degrees    Other  Recommendations Oral Care Recommendations: Oral care QID Other Recommendations: Have oral suction available   Follow up Recommendations Skilled Nursing facility      Frequency and Duration min 2x/week  2 weeks       Prognosis        Swallow Study   General HPI: 86 year old with past medical history significant for GI bleed, status post cholecystectomy 2019, history of pancreatitis, anemia, aortic stenosis, heart block status post pacemaker, diabetes, ESRD on  hemodialysis MWF, gout, history of CVA, hyperlipidemia, hypertension, prostate cancer, CAD who presents with altered mental status.  Patient has had a week of increasing weakness and fatigue and now presents with confusion.  He has been constipated, decreased oral intake.  He has right foot wound that he has been caring at home for the last 3 weeks.   Type of Study: Bedside Swallow Evaluation Previous Swallow Assessment: none Diet Prior to this Study: NPO Temperature Spikes Noted: No Respiratory Status: Room air History of Recent Intubation: No Behavior/Cognition: Distractible;Lethargic/Drowsy;Requires cueing Oral Cavity Assessment: Dry Oral Care Completed by SLP: Yes Oral Cavity - Dentition: Dentures, top;Edentulous Self-Feeding Abilities: Total assist Patient Positioning: Upright in bed Baseline Vocal Quality: Low vocal intensity Volitional Cough: Cognitively unable to elicit Volitional Swallow: Unable to elicit    Oral/Motor/Sensory Function Overall Oral Motor/Sensory Function: Generalized oral weakness   Ice Chips     Thin Liquid Thin Liquid: Impaired Presentation: Straw Oral Phase Impairments: Poor awareness of bolus Pharyngeal  Phase Impairments: Throat Clearing - Immediate    Nectar Thick Nectar Thick Liquid: Not tested   Honey Thick Honey Thick Liquid: Not tested   Puree Puree: Within functional limits   Solid     Solid: Impaired Oral Phase Impairments: Impaired mastication Oral Phase Functional Implications: Oral holding;Prolonged oral transit  Herbie Baltimore, MA [facility] Pager [phone] Office [phone]  Lynann Beaver 02/07/2020,9:38 AM

## 2020-02-07 NOTE — Progress Notes (Signed)
Pharmacy Antibiotic Note  Sean Best is a 86 y.o. male admitted on 02/18/2020 with foot wound infection.  Patient has had foot wound for last 3-4 weeks and completed 2 courses of outpatient Doxycycline with little improvement.  Pharmacy has been consulted for Vancomycin dosing.  Patient is also on Rocephin per MD orders.  The patient is ESRD-MWF but was noted to receive HD off-schedule on Tues, 12/7. Will give a dose of Vancomycin this AM to make up for missed dose with HD yesterday. Will continue to follow along with HD plans for subsequent doses.   Plan: - Vancomycin 750 mg IV x 1 now - No standing Vancomycin doses for now - will f/u on HD schedule - Will continue to follow HD schedule/duration, culture results, LOT, and antibiotic de-escalation plans   Height: 5\' 5"  (165.1 cm) Weight: 82.8 kg (182 lb 8.7 oz) IBW/kg (Calculated) : 61.5  Temp (24hrs), Avg:98.6 F (37 C), Min:97.4 F (36.3 C), Max:99.7 F (37.6 C)  Recent Labs  Lab 02/03/2020 1312 02/06/20 0800 02/06/20 1201 02/07/20 0507  WBC 15.8* 16.1* 16.2* 16.5*  CREATININE 7.59* 8.57*  --  5.48*    Estimated Creatinine Clearance: 9.9 mL/min (A) (by C-G formula based on SCr of 5.48 mg/dL (H)).    Allergies  Allergen Reactions  . Cardura [Doxazosin Mesylate] Other (See Comments)    Hallucinations  . Tape Dermatitis    Paper tape ok to use     Antimicrobials this admission: Vanc 12/7 >> Rocephin 12/6 >>  Microbiology results: 12/6 Fluvid >> 12/6 UCx >> 12/6 BCx >>  Thank you for allowing pharmacy to be a part of this patient's care.  Alycia Rossetti, PharmD, BCPS Clinical Pharmacist Clinical phone for 02/07/2020: [phone] 02/07/2020 11:27 AM   **Pharmacist phone directory can now be found on [email or web address] ([email or web address]).  Listed under [place].

## 2020-02-07 NOTE — Progress Notes (Signed)
[facility] Gastroenterology Progress Note  Sean Best 86 y.o. May 11, 1933  CC: Abnormal LFTs, chronic pancreatitis   Subjective: Patient seen and examined at bedside.  Patient's daughter as well as Dr. Tyrell Antonio at bedside.  He is complaining of right upper quadrant discomfort.  Denies nausea vomiting.  Denies diarrhea or bleeding.  ROS : Negative for confusion.  Negative for chest pain.   Objective: Vital signs in last 24 hours: Vitals:   02/07/20 0501 02/07/20 1103  BP: 114/71 109/73  Pulse: (!) 57 73  Resp: 16   Temp: 99.7 F (37.6 C) (!) 97.4 F (36.3 C)  SpO2: 97%     Physical Exam:  General:  Alert, cooperative, no distress, appears stated age  Head:  Normocephalic, without obvious abnormality, atraumatic  Eyes:  , EOM's intact,   Lungs:    No respiratory distress noted  Heart:  Regular rate and rhythm, S1, S2 normal  Abdomen:   Soft, mild right upper quadrant tenderness to palpation, soft, bowel sounds present.  No peritoneal signs  Psych  mood and affect normal       Lab Results: Recent Labs    02/06/20 0800 02/07/20 0507  NA 137 137  K 4.7 3.8  CL 89* 94*  CO2 21* 23  GLUCOSE 111* 88  BUN 38* 23  CREATININE 8.57* 5.48*  CALCIUM 8.8* 8.3*   Recent Labs    02/06/20 0800 02/07/20 0507  AST 1,382* 1,490*  ALT 860* 1,219*  ALKPHOS 132* 136*  BILITOT 1.6* 1.6*  PROT 5.9* 5.3*  ALBUMIN 2.9* 2.6*   Recent Labs    02/03/2020 1312 02/06/20 0800 02/06/20 1201 02/07/20 0507  WBC 15.8*   < > 16.2* 16.5*  NEUTROABS 14.2*  --   --   --   HGB 13.0   < > 11.8* 12.1*  HCT 44.1   < > 38.6* 37.8*  MCV 87.8   < > 84.3 83.6  PLT 225   < > 164 134*   < > = values in this interval not displayed.   Recent Labs    02/06/20 0800 02/07/20 0507  LABPROT 26.7* 24.9*  INR 2.6* 2.4*      Assessment/Plan: -Abnormal LFTs with transaminases more than 1000.  Total bilirubin 1.6.  Most likely ischemic hepatitis.  Patient used to take at least 6 Tylenols per day at  home according to daughter.  Denies any significant alcohol use.  Hepatitis panel negative. -Encephalopathy.  Improving -Chronic pancreatitis -End-stage renal disease on dialysis  Recommendations ---------------------- -Check acetaminophen level -Follow other secondary markers for liver disease -Recommend daily LFTs and INR. -Another dose of vitamin K.  10 mg p.o. -GI will follow   Otis Brace MD, [place] 02/07/2020, 12:10 PM  Contact #  [phone]

## 2020-02-07 NOTE — Progress Notes (Signed)
PROGRESS NOTE    Sean Best  UKG:[id number] DOB: 05-28-1933 DOA: 02/19/2020 PCP: Seward Carol, MD   Brief Narrative: 86 year old with past medical history significant for GI bleed, status post cholecystectomy 2019, history of pancreatitis, anemia, aortic stenosis, heart block status post pacemaker, diabetes, ESRD on hemodialysis MWF, gout, history of CVA, hyperlipidemia, hypertension, prostate cancer, CAD who presents with altered mental status.  Patient has had a week of increasing weakness and fatigue and now presents with confusion.  He has been constipated, decreased oral intake.  He has right foot wound that he has been caring at home for the last 3 weeks.  He follows with Dr. Donnetta Hutching.   Evaluation in the ED patient systolic blood pressure was low in the 80s, transaminases AST 578 ALT 439, leukocytosis, INR 2.1.  Ammonia 177.  Chest x-ray show some congestion.  CT abdomen showed bilateral 2 mm obstructing renal stone, numerous bilateral partially calcified and noncalcified renal cyst, diffuse urinary bladder wall thickening, multiple prostate radiation implantation seeds.  Quadrant ultrasound: Status post cholecystectomy, mild prominence of the common bile duct which may be due to post surgical dilation, not definite if shadowing stone, small amount of perihepatic ascites.  Patient admitted with hepatic encephalopathy, hepatitis, concern for infection unclear source. .  Assessment & Plan:   Principal Problem:   Acute encephalopathy Active Problems:   Anemia   HTN (hypertension)   Hyperlipidemia   Heart block   Cardiac pacemaker in situ ([employer] DDD)   Leukocytosis   End-stage renal disease on hemodialysis (HCC)   HLD (hyperlipidemia)   Diabetes mellitus type 2, uncontrolled, with complications (HCC)   Anemia in chronic kidney disease   Hypothyroidism, unspecified   Hepatitis   1-Acute hepatic encephalopathy: Patient presented with elevated ammonia at 77 the setting of  hepatitis. Ammonia down to 33. Continue with lactulose oral.  CT head: No acute intracranial abnormality  2-Acute hepatitis, transaminases, status post cholecystectomy 2019 -Future elevation of AST ALT. -Hold statins. -CT did not show any focal liver abnormality. -Viral hepatitis panel negative. -GI has been consulted. -Concern for shock liver, related to hypotension and underlying infection -tylenol level less than 10.   3-Diabetes: Presented with hypoglycemia CBG in the 33's. Continue with D10, decreased rate to 35 cc.   4-Leukocytosis, hypotension: Concern for infection, blood cultures no growth to date.  Continue with vancomycin, continue with ceftriaxone, ? Foot infection.  Per daughter patient was getting IV antibiotics with dialysis for foot wound infection Chest  x-ray: Cardiomegaly with vascular congestion.  5-ESRD on hemodialysis MWF: Neurology consulted. Continue with calcitriol, cinacalcet, PhosLo.   6-Hypertension, aortic stenosis, heart block status post pacemaker: Continue with metoprolol, holder parameter  for hypotension  8-Hyperlipidemia, history of CVA: Continue with aspirin.  Hold atorvastatin due to transaminases.   9-Right foot with wound, PVD Wound care consulted. Vascular consulted, further  discoloration of the right foot Vascular doesn't think foot is source of infection. Per discussion with family and Vascular plan is for observation and wound care.     Estimated body mass index is 30.38 kg/m as calculated from the following:   Height as of this encounter: 5\' 5"  (1.651 m).   Weight as of this encounter: 82.8 kg.   DVT prophylaxis: SCDs Code Status: Full code Family Communication: Daughter at bedside Disposition Plan:  Status is: Inpatient  Remains inpatient appropriate because:IV treatments appropriate due to intensity of illness or inability to take PO   Dispo: The patient is from: Home  Anticipated d/c is to: To be  determined              Anticipated d/c date is: 3 days              Patient currently is not medically stable to d/c.        Consultants:  Vascular vascular Vascular GI Nephrology   Procedures:   HD  Antimicrobials:    Subjective: He has been sleepy per daughter. His confusion has improved, but not back to baseline.  We discussed about getting Palliative care consult for goals of care and to see if they can provide more support at Home.    Objective: Vitals:   02/06/20 1714 02/06/20 2036 02/07/20 0501 02/07/20 1103  BP: (!) 117/51 102/72 114/71 109/73  Pulse: 81 91 (!) 57 73  Resp: 17 18 16    Temp: 98.2 F (36.8 C) 99.6 F (37.6 C) 99.7 F (37.6 C) (!) 97.4 F (36.3 C)  TempSrc: Oral Oral Oral Oral  SpO2: 94% 95% 97%   Weight:  82.8 kg    Height:        Intake/Output Summary (Last 24 hours) at 02/07/2020 1429 Last data filed at 02/07/2020 1132 Gross per 24 hour  Intake 2225 ml  Output 500 ml  Net 1725 ml   Filed Weights   02/06/20 1225 02/06/20 1629 02/06/20 2036  Weight: 80.2 kg 79.6 kg 82.8 kg    Examination:  General exam: sleepy, open eyes to voice.  Respiratory system: CTA Cardiovascular system: S 1, S 2 RRR Gastrointestinal system: BS present, soft, mild tender.  Central nervous system: sleepy, wake up to voice, answer few questions.  Extremities: right foot with redness, discoloration   Data Reviewed: I have personally reviewed following labs and imaging studies  CBC: Recent Labs  Lab 02/19/2020 1312 02/06/20 0800 02/06/20 1201 02/07/20 0507  WBC 15.8* 16.1* 16.2* 16.5*  NEUTROABS 14.2*  --   --   --   HGB 13.0 12.8* 11.8* 12.1*  HCT 44.1 40.5 38.6* 37.8*  MCV 87.8 84.9 84.3 83.6  PLT 225 171 164 952*   Basic Metabolic Panel: Recent Labs  Lab 02/11/2020 1312 02/06/20 0800 02/07/20 0507  NA 136 137 137  K 4.6 4.7 3.8  CL 89* 89* 94*  CO2 22 21* 23  GLUCOSE 79 111* 88  BUN 33* 38* 23  CREATININE 7.59* 8.57* 5.48*  CALCIUM  8.8* 8.8* 8.3*   GFR: Estimated Creatinine Clearance: 9.9 mL/min (A) (by C-G formula based on SCr of 5.48 mg/dL (H)). Liver Function Tests: Recent Labs  Lab 02/08/2020 1312 02/06/20 0800 02/07/20 0507  AST 578* 1,382* 1,490*  ALT 439* 860* 1,219*  ALKPHOS 126 132* 136*  BILITOT 1.5* 1.6* 1.6*  PROT 5.9* 5.9* 5.3*  ALBUMIN 3.1* 2.9* 2.6*   No results for input(s): LIPASE, AMYLASE in the last 168 hours. Recent Labs  Lab 02/29/2020 1748 02/06/20 0800 02/07/20 0903  AMMONIA 77* 49* 33   Coagulation Profile: Recent Labs  Lab 02/22/2020 1312 02/06/20 0800 02/07/20 0507  INR 2.1* 2.6* 2.4*   Cardiac Enzymes: Recent Labs  Lab 02/06/20 1042  CKTOTAL 182   BNP (last 3 results) No results for input(s): PROBNP in the last 8760 hours. HbA1C: No results for input(s): HGBA1C in the last 72 hours. CBG: Recent Labs  Lab 02/06/20 0704 02/06/20 1123 02/06/20 1416 02/07/20 0641 02/07/20 1206  GLUCAP 111* 98 85 74 110*   Lipid Profile: No results for input(s): CHOL, HDL, LDLCALC,  TRIG, CHOLHDL, LDLDIRECT in the last 72 hours. Thyroid Function Tests: No results for input(s): TSH, T4TOTAL, FREET4, T3FREE, THYROIDAB in the last 72 hours. Anemia Panel: Recent Labs    02/07/20 0507  FERRITIN 187  TIBC 234*  IRON 22*   Sepsis Labs: Recent Labs  Lab 02/06/20 1757 02/07/20 0507  PROCALCITON 1.46 1.96    Recent Results (from the past 240 hour(s))  Blood culture (routine x 2)     Status: None (Preliminary result)   Collection Time: 03/01/2020  1:13 PM   Specimen: BLOOD  Result Value Ref Range Status   Specimen Description BLOOD SITE NOT SPECIFIED  Final   Special Requests AEROBIC BOTTLE ONLY Blood Culture adequate volume  Final   Culture   Final    NO GROWTH 2 DAYS Performed at [facility], [place] [address], [place], [place] [address]    Report Status PENDING  Incomplete  Blood culture (routine x 2)     Status: None (Preliminary result)   Collection Time: 02/28/2020   1:15 PM   Specimen: BLOOD  Result Value Ref Range Status   Specimen Description BLOOD SITE NOT SPECIFIED  Final   Special Requests AEROBIC BOTTLE ONLY Blood Culture adequate volume  Final   Culture   Final    NO GROWTH 2 DAYS Performed at [facility], [place] [address] [place], [place] [address]    Report Status PENDING  Incomplete  Resp Panel by RT-PCR (Flu A&B, Covid) Nasopharyngeal Swab     Status: None   Collection Time: 02/21/2020  1:54 PM   Specimen: Nasopharyngeal Swab; Nasopharyngeal(NP) swabs in vial transport medium  Result Value Ref Range Status   SARS Coronavirus 2 by RT PCR NEGATIVE NEGATIVE Final    Comment: (NOTE) SARS-CoV-2 target nucleic acids are NOT DETECTED.  The SARS-CoV-2 RNA is generally detectable in upper respiratory specimens during the acute phase of infection. The lowest concentration of SARS-CoV-2 viral copies this assay can detect is 138 copies/mL. A negative result does not preclude SARS-Cov-2 infection and should not be used as the sole basis for treatment or other patient management decisions. A negative result may occur with  improper specimen collection/handling, submission of specimen other than nasopharyngeal swab, presence of viral mutation(s) within the areas targeted by this assay, and inadequate number of viral copies(<138 copies/mL). A negative result must be combined with clinical observations, patient history, and epidemiological information. The expected result is Negative.  Fact Sheet for Patients:  EntrepreneurPulse.com.au  Fact Sheet for Healthcare Providers:  IncredibleEmployment.be  This test is no t yet approved or cleared by the [place] FDA and  has been authorized for detection and/or diagnosis of SARS-CoV-2 by FDA under an Emergency Use Authorization (EUA). This EUA will remain  in effect (meaning this test can be used) for the duration of the COVID-19 declaration under Section  564(b)(1) of the Act, 21 U.S.C.section 360bbb-3(b)(1), unless the authorization is terminated  or revoked sooner.       Influenza A by PCR NEGATIVE NEGATIVE Final   Influenza B by PCR NEGATIVE NEGATIVE Final    Comment: (NOTE) The Xpert Xpress SARS-CoV-2/FLU/RSV plus assay is intended as an aid in the diagnosis of influenza from Nasopharyngeal swab specimens and should not be used as a sole basis for treatment. Nasal washings and aspirates are unacceptable for Xpert Xpress SARS-CoV-2/FLU/RSV testing.  Fact Sheet for Patients: EntrepreneurPulse.com.au  Fact Sheet for Healthcare Providers: IncredibleEmployment.be  This test is not yet approved or cleared  by the [place] and has been authorized for detection and/or diagnosis of SARS-CoV-2 by FDA under an Emergency Use Authorization (EUA). This EUA will remain in effect (meaning this test can be used) for the duration of the COVID-19 declaration under Section 564(b)(1) of the Act, 21 U.S.C. section 360bbb-3(b)(1), unless the authorization is terminated or revoked.  Performed at [facility], [place] [address] [place], [place] [address]          Radiology Studies: CT ABDOMEN PELVIS WO CONTRAST  Result Date: 02/02/2020 CLINICAL DATA:  Abdominal distension with altered mental status and constipation. EXAM: CT ABDOMEN AND PELVIS WITHOUT CONTRAST TECHNIQUE: Multidetector CT imaging of the abdomen and pelvis was performed following the standard protocol without IV contrast. COMPARISON:  September 20, 2019 FINDINGS: Lower chest: No acute abnormality. Hepatobiliary: No focal liver abnormality is seen. Status post cholecystectomy. No biliary dilatation. Pancreas: Unremarkable. No pancreatic ductal dilatation or surrounding inflammatory changes. Spleen: Normal in size without focal abnormality. Adrenals/Urinary Tract: Adrenal glands are unremarkable. Kidneys are normal in size, without obstructing  renal calculi or hydronephrosis. Numerous bilateral partially calcified and noncalcified cysts are seen within both kidneys kidneys. Bilateral 2 mm nonobstructing renal stones are seen. The urinary bladder is contracted and subsequently limited in evaluation. Diffuse thick uretero bladder wall thickening is suspected. This is seen on the prior study. Stomach/Bowel: Stomach is within normal limits. Appendix appears normal. No evidence of bowel wall thickening, distention, or inflammatory changes. Vascular/Lymphatic: Aortic atherosclerosis with stable 3.1 cm infrarenal aneurysmal dilatation. No enlarged abdominal or pelvic lymph nodes. Reproductive: Multiple prostate radiation implantation seeds are seen. Other: No abdominal wall hernia or abnormality. No abdominopelvic ascites. Musculoskeletal: Marked severity chronic and multilevel degenerative changes are seen throughout the lumbar spine. IMPRESSION: 1. Bilateral 2 mm nonobstructing renal stones. 2. Numerous bilateral partially calcified and noncalcified renal cysts. 3. Diffuse urinary bladder wall thickening which is seen on the prior study and may represent sequelae associated with cystitis. 4. Multiple prostate radiation implantation seeds. 5. Aortic atherosclerosis. Aortic Atherosclerosis (ICD10-I70.0). Electronically Signed   By: Virgina Norfolk M.D.   On: 02/19/2020 21:09   US Abdomen Limited  Result Date: 02/10/2020 CLINICAL DATA:  Elevated LFTs EXAM: ULTRASOUND ABDOMEN LIMITED RIGHT UPPER QUADRANT COMPARISON:  None. FINDINGS: Gallbladder: The patient is status post cholecystectomy. No biliary ductal dilation. Common bile duct: Diameter: 1 cm Liver: No focal lesion identified. Within normal limits in parenchymal echogenicity. Portal vein is patent on color Doppler imaging with normal direction of blood flow towards the liver. Other: Small amount of perihepatic ascites is seen. IMPRESSION: Status post cholecystectomy. Mild prominence of the common bile  duct which may be due to postsurgical dilation, no definite shadowing stones are seen. Small amount of perihepatic ascites. Electronically Signed   By: Prudencio Pair M.D.   On: 02/09/2020 19:53        Scheduled Meds: . aspirin  81 mg Oral Daily  . calcitRIOL  1 mcg Oral Q M,W,F-HD  . calcium acetate  2,001 mg Oral TID WC  . Chlorhexidine Gluconate Cloth  6 each Topical Q0600  . cinacalcet  90 mg Oral Q M,W,F  . ferrous sulfate  325 mg Oral Q breakfast  . heparin  5,000 Units Subcutaneous Q8H  . lactulose  10 g Oral TID  . metoprolol tartrate  12.5 mg Oral BID  . sodium chloride flush  3 mL Intravenous Q12H  . vancomycin variable dose per unstable renal function (pharmacist dosing)   Does  not apply See admin instructions   Continuous Infusions: . cefTRIAXone (ROCEPHIN)  IV Stopped (02/07/20 0325)  . dextrose 30 mL/hr at 02/07/20 1411     LOS: 2 days    Time spent: 35 minutes    Kara Melching A Anastasiya Gowin, MD [facility]   If 7PM-7AM, please contact night-coverage [email or web address]  02/07/2020, 2:29 PM

## 2020-02-07 NOTE — Evaluation (Signed)
Physical Therapy Evaluation Patient Details Name: Sean Best MRN: [id number] DOB: Jul 05, 1933 Today's Date: 02/07/2020   History of Present Illness  86 y.o. male with medical history significant of history of GI bleed, status post cholecystectomy in 2019, history of pancreatitis, anemia, aortic stenosis, heart block status post pacemaker, diabetes, ESRD on HD MWF, gout, history of CVA, hyperlipidemia, hypertension, CAD, prostate cancer who presents with altered mental status.  Patient has had about a week or so of increasing weakness and fatigue and now confusion.  He has had some constipation and decreased p.o. intake according to himself and cooperated by his daughter.  She also states he has had a foot wound that she has been caring for the past 3 weeks.  Clinical Impression   Pt presents with weakness, impaired cognition vs baseline, R foot chronic ulceration, impaired sitting balance, difficulty performing bed mobility tasks, and decreased activity tolerance. Pt to benefit from acute PT to address deficits. Pt requiring total assist for moving to and from EOB this day, unable to progress to standing given lethargy and difficulty maintaining static sitting even with significant truncal assist. Per pt's daughter, pt is far from mobility and cognitive baseline. PT recommending ST-SNF to address mobility deficits and return to PLOF. PT to progress mobility as tolerated, and will continue to follow acutely.      Follow Up Recommendations Supervision/Assistance - 24 hour;SNF    Equipment Recommendations  Wheelchair (measurements PT);Wheelchair cushion (measurements PT)    Recommendations for Other Services       Precautions / Restrictions Precautions Precautions: Fall Restrictions Weight Bearing Restrictions: No      Mobility  Bed Mobility Overal bed mobility: Needs Assistance Bed Mobility: Supine to Sit;Sit to Supine     Supine to sit: Total assist Sit to supine: Total assist    General bed mobility comments: Total assist for trunk and LE management, scooting to and from EOB, and boost up in bed upon return to supine. Pt sat EOB x2 minutes with posterior truncal support, limited by lethargy/eye closing. Pt does not explicitly report dizziness, no dynamap in room to assess BP.    Transfers                    Ambulation/Gait                Stairs            Wheelchair Mobility    Modified Rankin (Stroke Patients Only)       Balance Overall balance assessment: Needs assistance Sitting-balance support: Bilateral upper extremity supported;Feet supported Sitting balance-Leahy Scale: Poor Sitting balance - Comments: requires at least mod posterior assist to maintain upright sitting at EOB Postural control: Posterior lean     Standing balance comment: unable to stand on PT eval                             Pertinent Vitals/Pain Pain Assessment: No/denies pain    Home Living Family/patient expects to be discharged to:: Private residence Living Arrangements: Children (daughter Sean Best, goes by [place]) Available Help at Discharge: Family Type of Home: House Home Access: Ramped entrance     Home Layout: One level Home Equipment: Environmental consultant - 4 wheels;Cane - single point;Grab bars - tub/shower      Prior Function Level of Independence: Needs assistance   Gait / Transfers Assistance Needed: Pt's daughter reports pt ambulates with rollator  ADL's / Homemaking Assistance Needed:  Pt's daughter reports pt able to perform wash up, dressing for himself  Comments: Pt is involved in [employer] program, working on getting pt a walk in shower     Hand Dominance   Dominant Hand: Right    Extremity/Trunk Assessment   Upper Extremity Assessment Upper Extremity Assessment: Defer to OT evaluation    Lower Extremity Assessment Lower Extremity Assessment: Generalized weakness    Cervical / Trunk Assessment Cervical /  Trunk Assessment: Normal  Communication   Communication: Other (comment) (lethargy)  Cognition Arousal/Alertness: Lethargic Behavior During Therapy: Flat affect Overall Cognitive Status: Impaired/Different from baseline Area of Impairment: Orientation;Memory;Attention;Following commands;Safety/judgement;Awareness;Problem solving                 Orientation Level: Disoriented to;Person;Time;Situation Current Attention Level: Focused Memory: Decreased short-term memory Following Commands: Follows one step commands inconsistently Safety/Judgement: Decreased awareness of safety;Decreased awareness of deficits Awareness: Intellectual Problem Solving: Slow processing;Decreased initiation;Difficulty sequencing;Requires verbal cues;Requires tactile cues General Comments: Pt drowsy upon PT arrival to room, requires frequent multimodal cuing to stay awake during session. Pt oriented to name and location, but not birthday or why he is in hospital. Attention span limited by pt fatigue      General Comments General comments (skin integrity, edema, etc.): chronic R foot ulceration    Exercises General Exercises - Lower Extremity Heel Slides: AAROM;Both;5 reps;Supine   Assessment/Plan    PT Assessment Patient needs continued PT services  PT Problem List Decreased strength;Decreased mobility;Decreased safety awareness;Decreased activity tolerance;Decreased balance;Decreased knowledge of use of DME;Decreased cognition;Decreased skin integrity;Decreased range of motion       PT Treatment Interventions Therapeutic activities;DME instruction;Gait training;Therapeutic exercise;Patient/family education;Balance training;Functional mobility training;Neuromuscular re-education    PT Goals (Current goals can be found in the Care Plan section)  Acute Rehab PT Goals Patient Stated Goal: per daughter, pt to return to baseline PT Goal Formulation: With patient/family Time For Goal Achievement:  02/21/20 Potential to Achieve Goals: Good    Frequency Min 2X/week   Barriers to discharge        Co-evaluation               AM-PAC PT "6 Clicks" Mobility  Outcome Measure Help needed turning from your back to your side while in a flat bed without using bedrails?: A Lot Help needed moving from lying on your back to sitting on the side of a flat bed without using bedrails?: Total Help needed moving to and from a bed to a chair (including a wheelchair)?: Total Help needed standing up from a chair using your arms (e.g., wheelchair or bedside chair)?: Total Help needed to walk in hospital room?: Total Help needed climbing 3-5 steps with a railing? : Total 6 Click Score: 7    End of Session   Activity Tolerance: Patient limited by lethargy Patient left: in bed;with call bell/phone within reach;with bed alarm set;with family/visitor present Nurse Communication: Mobility status PT Visit Diagnosis: Other abnormalities of gait and mobility (R26.89);Muscle weakness (generalized) (M62.81)    Time: 8101-7510 PT Time Calculation (min) (ACUTE ONLY): 19 min   Charges:   PT Evaluation $PT Eval Low Complexity: 1 Low        Meka Lewan E, PT Acute Rehabilitation Services Pager [phone]  Office [phone]   Esti Demello D Elonda Husky 02/07/2020, 1:48 PM

## 2020-02-07 NOTE — Consult Note (Signed)
   [facility] Inpatient Consult   02/07/2020  Diyari Cherne 1933-03-10 [id number]  [place] Organization [ACO] Patient:  Medicare NextGen   This writer was made aware of patient by report and inpatient TOC RNCM on 02/06/20, patient was in dialysis.  Patient is currently active with [facility] program. Patient has been engaged by a [facility] for that program.   Our community based program plan of care has focused on [employer] support and disease management support as well. Was awaiting PT/OT evaluation as ordered.  Went to room and checked on patient and he was asleep on rounds.  Plan: Inpatient Transition Of Care [TOC] team member is aware that [facility] following. Will follow for transition of care as PT/OT recommended a ST SNF level of care will follow progress.   Of note, [facility] Care Management services does not replace or interfere with any services that are needed or arranged by inpatient [facility] care management team.  For additional questions or referrals please contact:  Natividad Brood, RN BSN [facility] Liaison  [phone] business mobile phone Toll free office [phone]  Fax number: [phone] [email or web address]

## 2020-02-07 NOTE — Evaluation (Signed)
Occupational Therapy Evaluation Patient Details Name: Sean Best MRN: [id number] DOB: 29-Jan-1934 Today's Date: 02/07/2020    History of Present Illness 86 y.o. male with medical history significant of history of GI bleed, status post cholecystectomy in 2019, history of pancreatitis, anemia, aortic stenosis, heart block status post pacemaker, diabetes, ESRD on HD MWF, gout, history of CVA, hyperlipidemia, hypertension, CAD, prostate cancer who presents with altered mental status.  Patient has had about a week or so of increasing weakness and fatigue and now confusion.  He has had some constipation and decreased p.o. intake according to himself and cooperated by his daughter.  She also states he has had a foot wound that she has been caring for the past 3 weeks.   Clinical Impression   Pt presents with decline in function and safety with ADLs and ADL mobility with impaired strength, balance, endurance and cognition. Pt's' daughter reports that cognition ha improved but not back to baseline and that pt was Ind with ADLs/selfcare and used a RW for mobility PTA. Pt with functional deficits listed below and would benefit from acute OT services to address impairments to maximize level of function and safety    Follow Up Recommendations  SNF;Supervision/Assistance - 24 hour    Equipment Recommendations  Other (comment) (TBD at next venue of care)    Recommendations for Other Services       Precautions / Restrictions Precautions Precautions: Fall Restrictions Weight Bearing Restrictions: No      Mobility Bed Mobility Overal bed mobility: Needs Assistance Bed Mobility: Supine to Sit;Sit to Supine     Supine to sit: Max assist Sit to supine: Max assist   General bed mobility comments: max A to elevate trunk and with LEs back onto bed. Multimodal cues required to initiate and sequence. Pt sat EOB x 4 minutes with mod A for trunk balance/support    Transfers                  General transfer comment: NT, unable    Balance Overall balance assessment: Needs assistance Sitting-balance support: Bilateral upper extremity supported;Feet supported Sitting balance-Leahy Scale: Poor Sitting balance - Comments: requires  mod A at posterior assist to maintain upright sitting at EOB Postural control: Posterior lean     Standing balance comment: unable to stand on PT eval                           ADL either performed or assessed with clinical judgement   ADL Overall ADL's : Needs assistance/impaired Eating/Feeding: Moderate assistance Eating/Feeding Details (indicate cue type and reason): hand over hand assist to drink from cup, multimodal cues Grooming: Wash/dry hands;Wash/dry face;Moderate assistance Grooming Details (indicate cue type and reason): hand over hand assist, multimodal cues Upper Body Bathing: Total assistance   Lower Body Bathing: Total assistance   Upper Body Dressing : Total assistance   Lower Body Dressing: Total assistance   Toilet Transfer: Total assistance   Toileting- Clothing Manipulation and Hygiene: Total assistance         General ADL Comments: pt confused and required multimodal cues to initiate and sequnce through tasks     Vision Baseline Vision/History: Wears glasses Patient Visual Report: No change from baseline       Perception     Praxis      Pertinent Vitals/Pain Pain Assessment: No/denies pain     Hand Dominance Right   Extremity/Trunk Assessment Upper Extremity Assessment Upper Extremity Assessment:  Generalized weakness   Lower Extremity Assessment Lower Extremity Assessment: Defer to PT evaluation   Cervical / Trunk Assessment Cervical / Trunk Assessment: Normal   Communication Communication Communication: Other (comment) (lethargy)   Cognition Arousal/Alertness: Awake/alert Behavior During Therapy: Flat affect Overall Cognitive Status: Impaired/Different from baseline Area of  Impairment: Orientation;Memory;Attention;Following commands;Safety/judgement;Awareness;Problem solving                 Orientation Level: Disoriented to;Person;Time;Situation Current Attention Level: Focused Memory: Decreased short-term memory Following Commands: Follows one step commands inconsistently Safety/Judgement: Decreased awareness of safety;Decreased awareness of deficits Awareness: Intellectual Problem Solving: Slow processing;Decreased initiation;Difficulty sequencing;Requires verbal cues;Requires tactile cues General Comments: Pt drowsy upon PT arrival to room, requires frequent multimodal cuing to stay awake during session. Pt oriented to name and location, but not birthday or why he is in hospital. Attention span limited by pt fatigue   General Comments  chronic R foot ulceration    Exercises General Exercises - Lower Extremity Heel Slides: AAROM;Both;5 reps;Supine   Shoulder Instructions      Home Living Family/patient expects to be discharged to:: Private residence Living Arrangements: Children Available Help at Discharge: Family Type of Home: House Home Access: [place]: One level     Bathroom Shower/Tub: Teacher, early years/pre: Handicapped height     Home Equipment: Environmental consultant - 4 wheels;Cane - single point;Grab bars - tub/shower          Prior Functioning/Environment Level of Independence: Needs assistance  Gait / Transfers Assistance Needed: Pt's daughter reports pt ambulates with rollator ADL's / Homemaking Assistance Needed: Pt's daughter reports pt able to perform bathin, dressing, toileting for himself   Comments: Pt is involved in aging gracefully program, working on getting pt a walk in shower        OT Problem List: Decreased strength;Impaired balance (sitting and/or standing);Decreased cognition;Decreased activity tolerance;Decreased knowledge of use of DME or AE;Decreased safety awareness      OT  Treatment/Interventions: Self-care/ADL training;Therapeutic exercise;Balance training;DME and/or AE instruction;Patient/family education    OT Goals(Current goals can be found in the care plan section) Acute Rehab OT Goals Patient Stated Goal: per daughter, pt to return to baseline OT Goal Formulation: With patient/family Time For Goal Achievement: 02/21/20 Potential to Achieve Goals: Good ADL Goals Pt Will Perform Grooming: with min assist;sitting Pt Will Perform Upper Body Bathing: with min assist;sitting Pt Will Perform Upper Body Dressing: with min assist;sitting Pt Will Transfer to Toilet: with max assist;with mod assist;stand pivot transfer;bedside commode  OT Frequency: Min 2X/week   Barriers to D/C:            Co-evaluation              AM-PAC OT "6 Clicks" Daily Activity     Outcome Measure Help from another person eating meals?: A Lot Help from another person taking care of personal grooming?: A Lot Help from another person toileting, which includes using toliet, bedpan, or urinal?: Total Help from another person bathing (including washing, rinsing, drying)?: Total Help from another person to put on and taking off regular upper body clothing?: Total Help from another person to put on and taking off regular lower body clothing?: Total 6 Click Score: 8   End of Session    Activity Tolerance: Patient limited by fatigue Patient left: in bed;with call bell/phone within reach;with bed alarm set;with family/visitor present  OT Visit Diagnosis: Other abnormalities of gait and mobility (R26.89);Muscle weakness (generalized) (M62.81);Other  symptoms and signs involving cognitive function                Time: 6294-7654 OT Time Calculation (min): 24 min Charges:  OT General Charges $OT Visit: 1 Visit OT Treatments $Therapeutic Activity: 8-22 mins    Britt Bottom 02/07/2020, 3:37 PM

## 2020-02-08 ENCOUNTER — Other Ambulatory Visit: Payer: Self-pay

## 2020-02-08 DIAGNOSIS — E785 Hyperlipidemia, unspecified: Secondary | ICD-10-CM

## 2020-02-08 DIAGNOSIS — E7800 Pure hypercholesterolemia, unspecified: Secondary | ICD-10-CM

## 2020-02-08 DIAGNOSIS — E118 Type 2 diabetes mellitus with unspecified complications: Secondary | ICD-10-CM

## 2020-02-08 DIAGNOSIS — Z950 Presence of cardiac pacemaker: Secondary | ICD-10-CM

## 2020-02-08 DIAGNOSIS — Z992 Dependence on renal dialysis: Secondary | ICD-10-CM

## 2020-02-08 DIAGNOSIS — K759 Inflammatory liver disease, unspecified: Secondary | ICD-10-CM

## 2020-02-08 DIAGNOSIS — I459 Conduction disorder, unspecified: Secondary | ICD-10-CM

## 2020-02-08 DIAGNOSIS — D631 Anemia in chronic kidney disease: Secondary | ICD-10-CM

## 2020-02-08 DIAGNOSIS — I10 Essential (primary) hypertension: Secondary | ICD-10-CM

## 2020-02-08 DIAGNOSIS — E1165 Type 2 diabetes mellitus with hyperglycemia: Secondary | ICD-10-CM

## 2020-02-08 LAB — COMPREHENSIVE METABOLIC PANEL
ALT: 909 U/L — ABNORMAL HIGH (ref 0–44)
AST: 672 U/L — ABNORMAL HIGH (ref 15–41)
Albumin: 2.4 g/dL — ABNORMAL LOW (ref 3.5–5.0)
Alkaline Phosphatase: 129 U/L — ABNORMAL HIGH (ref 38–126)
Anion gap: 16 — ABNORMAL HIGH (ref 5–15)
BUN: 28 mg/dL — ABNORMAL HIGH (ref 8–23)
CO2: 24 mmol/L (ref 22–32)
Calcium: 8.1 mg/dL — ABNORMAL LOW (ref 8.9–10.3)
Chloride: 93 mmol/L — ABNORMAL LOW (ref 98–111)
Creatinine, Ser: 5.95 mg/dL — ABNORMAL HIGH (ref 0.61–1.24)
GFR, Estimated: 9 mL/min — ABNORMAL LOW (ref >60)
Glucose, Bld: 135 mg/dL — ABNORMAL HIGH (ref 70–99)
Potassium: 3.7 mmol/L (ref 3.5–5.1)
Sodium: 133 mmol/L — ABNORMAL LOW (ref 135–145)
Total Bilirubin: 1.1 mg/dL (ref 0.3–1.2)
Total Protein: 5.1 g/dL — ABNORMAL LOW (ref 6.5–8.1)

## 2020-02-08 LAB — CBC
HCT: 40.5 % (ref 39.0–52.0)
Hemoglobin: 12.7 g/dL — ABNORMAL LOW (ref 13.0–17.0)
MCH: 25.8 pg — ABNORMAL LOW (ref 26.0–34.0)
MCHC: 31.4 g/dL (ref 30.0–36.0)
MCV: 82.3 fL (ref 80.0–100.0)
Platelets: 112 10*3/uL — ABNORMAL LOW (ref 150–400)
RBC: 4.92 MIL/uL (ref 4.22–5.81)
RDW: 20.8 % — ABNORMAL HIGH (ref 11.5–15.5)
WBC: 14.5 10*3/uL — ABNORMAL HIGH (ref 4.0–10.5)
nRBC: 0.2 % (ref 0.0–0.2)

## 2020-02-08 LAB — ANTI-SMOOTH MUSCLE ANTIBODY, IGG: F-Actin IgG: 11 Units (ref 0–19)

## 2020-02-08 LAB — PROCALCITONIN: Procalcitonin: 2.3 ng/mL

## 2020-02-08 LAB — GLUCOSE, CAPILLARY: Glucose-Capillary: 117 mg/dL — ABNORMAL HIGH (ref 70–99)

## 2020-02-08 LAB — PROTIME-INR
INR: 1.9 — ABNORMAL HIGH (ref 0.8–1.2)
Prothrombin Time: 21.4 seconds — ABNORMAL HIGH (ref 11.4–15.2)

## 2020-02-08 LAB — ALPHA-1-ANTITRYPSIN: A-1 Antitrypsin, Ser: 179 mg/dL (ref 101–187)

## 2020-02-08 LAB — ANA W/REFLEX IF POSITIVE: Anti Nuclear Antibody (ANA): NEGATIVE

## 2020-02-08 LAB — CERULOPLASMIN: Ceruloplasmin: 28.1 mg/dL (ref 16.0–31.0)

## 2020-02-08 MED ORDER — PHYTONADIONE 5 MG PO TABS
10.0000 mg | ORAL_TABLET | ORAL | Status: AC
  Administered 2020-02-08: 10 mg via ORAL
  Filled 2020-02-08: qty 2

## 2020-02-08 MED ORDER — VANCOMYCIN HCL 750 MG/150ML IV SOLN
750.0000 mg | Freq: Once | INTRAVENOUS | Status: AC
  Administered 2020-02-08: 750 mg via INTRAVENOUS
  Filled 2020-02-08: qty 150

## 2020-02-08 MED ORDER — VANCOMYCIN HCL IN DEXTROSE 750-5 MG/150ML-% IV SOLN
750.0000 mg | INTRAVENOUS | Status: DC
  Filled 2020-02-08 (×2): qty 150

## 2020-02-08 NOTE — Progress Notes (Signed)
Pharmacy Antibiotic Note  Sean Best is a 86 y.o. male admitted on 02/07/2020 with foot wound infection.  Patient has had foot wound for last 3-4 weeks and completed 2 courses of outpatient Doxycycline with little improvement.  Pharmacy has been consulted for Vancomycin dosing.  Patient is also on Rocephin per MD orders.  The patient is ESRD-MWF but was noted to receive HD off-schedule on Tues, 12/7. Will give a dose of Vancomycin this AM to make up for missed dose with HD yesterday. Will continue to follow along with HD plans for subsequent doses.    Pt got an off schedule HD session today with plan to resume the regular schedule HD MWF tomorrow. We will put him on a standard regimen now.   Plan: - Vancomycin 750 mg IV x 1 today then MWF  - Will continue to follow HD schedule/duration, culture results, LOT, and antibiotic de-escalation plans   Height: 5\' 5"  (165.1 cm) Weight: 79.2 kg (174 lb 9.7 oz) IBW/kg (Calculated) : 61.5  Temp (24hrs), Avg:98 F (36.7 C), Min:97.5 F (36.4 C), Max:98.4 F (36.9 C)  Recent Labs  Lab 02/28/2020 1312 02/06/20 0800 02/06/20 1201 02/07/20 0507 02/08/20 0102  WBC 15.8* 16.1* 16.2* 16.5* 14.5*  CREATININE 7.59* 8.57*  --  5.48* 5.95*    Estimated Creatinine Clearance: 9 mL/min (A) (by C-G formula based on SCr of 5.95 mg/dL (H)).    Allergies  Allergen Reactions  . Cardura [Doxazosin Mesylate] Other (See Comments)    Hallucinations  . Tape Dermatitis    Paper tape ok to use     Antimicrobials this admission: Vanc 12/7 >> Rocephin 12/6 >>  Microbiology results: 12/6 UCx >>sent 12/6 BCx >>ngtd  Thank you for allowing pharmacy to be a part of this patient's care.  Alycia Rossetti, PharmD, BCPS Clinical Pharmacist Clinical phone for 02/08/2020: [phone] 02/08/2020 12:50 PM   **Pharmacist phone directory can now be found on [email or web address] ([email or web address]).  Listed under [place].

## 2020-02-08 NOTE — Patient Outreach (Signed)
Aging Gracefully Program  02/08/2020  Sean Best [birth year] [id number]   UPDATE: incoming call from daughter who left a voicemail.  Returned call.  Reviewed patients current admission.  PLAN: Email to care team from aging gracefully to update team on admission.  [name], RN, BSN, CEN [facility] [employer] [phone]

## 2020-02-08 NOTE — Progress Notes (Addendum)
Subjective:    Objective Vital signs in last 24 hours: Vitals:   02/08/20 0823 02/08/20 0828 02/08/20 0838 02/08/20 0848  BP: 103/65   121/70  Pulse: 65 86 (!) 27 66  Resp: 18 (!) 22 19 16   Temp:      TempSrc:      SpO2: (!) 84% (!) 86% (!) 80% 100%  Weight:      Height:       Weight change: 1.995 kg  Physical Exam: General:Alert  on hemodialysis no change in pleasant confusion, NAD Heart:RRR, no MRG Lungs:CTA laterally, nonlabored breathing Abdomen:Bowel sounds normoactive, soft nontender nondistended, no ascites Extremities: Trace bilateral pedal edema right foot wound wrapped dressing dry clean, (noted picture taken by admit physician) Dialysis Access:Positive bruit left forearm AV fistula  Dialysis Orders:[facility] hemodialysis unit MWF, 4 hours, 78 kg EDW, 2K, 2 calcium bath, UF profile 2, left forearm AV fistula access, no heparin, no ESA, calcitriol 0.75 mcg p.o. daily  Problem/Plan:  1. ESRD -HD on MWF schedule, off schedule today because of scheduling /emergent patients ,HD tomorrow back on schedule  2. Encephalopathy=elevated ammonia, elevated LFTs ,work-up per admit and GI consulted known history of pancreatitis, hypoglycemia 33 in ER requiring D10 drip, remains pleasantly confused 3. Hypertension/volume -patient hypotensive in ER initially, this a.m. 109/73, chest x-ray some vascular ingestion, mild volume on exam lower extremities edema with missed dialysis 12/06.UF as able today on dialysis  4. Anemia -Hgb 12.8> 12.1> 12.7 this a.m. no ESA 5. Metabolic bone disease -phosphate binders with p.o.'s diet calcitriol and hemodialysis follow-up calcium phosphorus trend 6. Nutrition -Albumin 2.9 >2.6 noted pured diet per speech / protein supplement 7. Right lower extremity foot wounds per Dr. Luther Parody team noted patient and daughter refused angiogram at this time and less wounds worsen,placed on IV antibiotics wound care 8. Liver failure with coagulopathy  encephalopathy=GI seeing work-up in progress and lactulose enemas twice daily patient is n.p.o. evaluate swallowing, on admit vitamin K for coagulopathy, will hold heparin on hemodialysis 9. Diabetes mellitus type 2 glycemic on admission=plan per admit team to currently home D10 drip at 58ml/hr   Ernest Haber, PA-C [place] [phone] 02/08/2020,9:10 AM  LOS: 3 days   Labs: Basic Metabolic Panel: Recent Labs  Lab 02/06/20 0800 02/07/20 0507 02/08/20 0102  NA 137 137 133*  K 4.7 3.8 3.7  CL 89* 94* 93*  CO2 21* 23 24  GLUCOSE 111* 88 135*  BUN 38* 23 28*  CREATININE 8.57* 5.48* 5.95*  CALCIUM 8.8* 8.3* 8.1*   Liver Function Tests: Recent Labs  Lab 02/06/20 0800 02/07/20 0507 02/08/20 0102  AST 1,382* 1,490* 672*  ALT 860* 1,219* 909*  ALKPHOS 132* 136* 129*  BILITOT 1.6* 1.6* 1.1  PROT 5.9* 5.3* 5.1*  ALBUMIN 2.9* 2.6* 2.4*   No results for input(s): LIPASE, AMYLASE in the last 168 hours. Recent Labs  Lab 02/06/2020 1748 02/06/20 0800 02/07/20 0903  AMMONIA 77* 49* 33   CBC: Recent Labs  Lab 02/12/2020 1312 02/06/20 0800 02/06/20 1201 02/07/20 0507 02/08/20 0102  WBC 15.8* 16.1* 16.2* 16.5* 14.5*  NEUTROABS 14.2*  --   --   --   --   HGB 13.0 12.8* 11.8* 12.1* 12.7*  HCT 44.1 40.5 38.6* 37.8* 40.5  MCV 87.8 84.9 84.3 83.6 82.3  PLT 225 171 164 134* 112*   Cardiac Enzymes: Recent Labs  Lab 02/06/20 1042  CKTOTAL 182   CBG: Recent Labs  Lab 02/06/20 1123 02/06/20 1416  02/07/20 0641 02/07/20 1206 02/07/20 1635  GLUCAP 98 85 74 110* 109*    Studies/Results: No results found. Medications: . cefTRIAXone (ROCEPHIN)  IV 2 g (02/07/20 2209)  . dextrose 30 mL/hr at 02/07/20 1411   . aspirin  81 mg Oral Daily  . calcitRIOL  1 mcg Oral Q M,W,F-HD  . calcium acetate  2,001 mg Oral TID WC  . Chlorhexidine Gluconate Cloth  6 each Topical Q0600  . cinacalcet  90 mg Oral Q M,W,F  . ferrous sulfate  325 mg Oral Q breakfast  .  lactulose  10 g Oral TID  . metoprolol tartrate  12.5 mg Oral BID  . sodium chloride flush  3 mL Intravenous Q12H  . vancomycin variable dose per unstable renal function (pharmacist dosing)   Does not apply See admin instructions

## 2020-02-08 NOTE — Progress Notes (Signed)
  Progress Note    02/08/2020 8:42 AM  Subjective:  Still confused.  Seen on HD   Vitals:   02/08/20 0813 02/08/20 0823  BP:  103/65  Pulse: (!) 121 65  Resp: 18 18  Temp:    SpO2: 96% (!) 84%   Physical Exam: Lungs:  Non labored Extremities:  R foot wounds dry Abdomen:  soft Neurologic: confused  CBC    Component Value Date/Time   WBC 14.5 (H) 02/08/2020 0102   RBC 4.92 02/08/2020 0102   HGB 12.7 (L) 02/08/2020 0102   HCT 40.5 02/08/2020 0102   PLT 112 (L) 02/08/2020 0102   MCV 82.3 02/08/2020 0102   MCH 25.8 (L) 02/08/2020 0102   MCHC 31.4 02/08/2020 0102   RDW 20.8 (H) 02/08/2020 0102   LYMPHSABS 0.8 02/12/2020 1312   MONOABS 0.8 02/01/2020 1312   EOSABS 0.0 02/04/2020 1312   BASOSABS 0.0 02/28/2020 1312    BMET    Component Value Date/Time   NA 133 (L) 02/08/2020 0102   K 3.7 02/08/2020 0102   CL 93 (L) 02/08/2020 0102   CO2 24 02/08/2020 0102   GLUCOSE 135 (H) 02/08/2020 0102   BUN 28 (H) 02/08/2020 0102   CREATININE 5.95 (H) 02/08/2020 0102   CALCIUM 8.1 (L) 02/08/2020 0102   GFRNONAA 9 (L) 02/08/2020 0102   GFRAA 8 (L) 09/25/2019 0421    INR    Component Value Date/Time   INR 1.9 (H) 02/08/2020 0102     Intake/Output Summary (Last 24 hours) at 02/08/2020 0842 Last data filed at 02/08/2020 0600 Gross per 24 hour  Intake 1767.45 ml  Output 0 ml  Net 1767.45 ml     Assessment/Plan:  86 y.o. male with R foot wounds  Patient still somewhat confused; seen during HD today R foot wounds stable; continue betadine paint and daily cleansing Would consider angiography when more stable and if daughter/patient consent    Dagoberto Ligas, PA-C [facility] [phone] 02/08/2020 8:42 AM

## 2020-02-08 NOTE — Progress Notes (Signed)
SLP Cancellation Note  Patient Details Name: Sean Best MRN: [id number] DOB: 12/17/1933   Cancelled treatment:       Reason Eval/Treat Not Completed: Patient at procedure or test/unavailable   Janessa Mickle, Katherene Ponto 02/08/2020, 7:48 AM

## 2020-02-08 NOTE — Progress Notes (Signed)
[facility] Progress Note  Sean Best 86 y.o. Nov 05, 1933  CC: Abnormal LFTs, chronic pancreatitis   Subjective: Patient seen and examined at bedside in the dialysis unit.  Feeling better.  Denies any GI symptoms.  ROS : Negative for confusion.  Negative for chest pain.   Objective: Vital signs in last 24 hours: Vitals:   02/08/20 0948 02/08/20 0953  BP: 113/74   Pulse: 61 66  Resp: 18 17  Temp:    SpO2:  99%    Physical Exam:  General:  Alert, cooperative, no distress, appears stated age  Head:  Normocephalic, without obvious abnormality, atraumatic  Eyes:  , EOM's intact,   Lungs:    No respiratory distress noted  Heart:  Regular rate and rhythm, S1, S2 normal  Abdomen:   Soft, mild right upper quadrant tenderness to palpation, soft, bowel sounds present.  No peritoneal signs  Psych  mood and affect normal       Lab Results: Recent Labs    02/07/20 0507 02/08/20 0102  NA 137 133*  K 3.8 3.7  CL 94* 93*  CO2 23 24  GLUCOSE 88 135*  BUN 23 28*  CREATININE 5.48* 5.95*  CALCIUM 8.3* 8.1*   Recent Labs    02/07/20 0507 02/08/20 0102  AST 1,490* 672*  ALT 1,219* 909*  ALKPHOS 136* 129*  BILITOT 1.6* 1.1  PROT 5.3* 5.1*  ALBUMIN 2.6* 2.4*   Recent Labs    02/24/2020 1312 02/06/20 0800 02/07/20 0507 02/08/20 0102  WBC 15.8*   < > 16.5* 14.5*  NEUTROABS 14.2*  --   --   --   HGB 13.0   < > 12.1* 12.7*  HCT 44.1   < > 37.8* 40.5  MCV 87.8   < > 83.6 82.3  PLT 225   < > 134* 112*   < > = values in this interval not displayed.   Recent Labs    02/07/20 0507 02/08/20 0102  LABPROT 24.9* 21.4*  INR 2.4* 1.9*      Assessment/Plan: -Abnormal LFTs with transaminases more than 1000 on admission..  Total bilirubin 1.6.  Most likely ischemic hepatitis.   Denies any significant alcohol use.  -Encephalopathy.  Improving -Chronic pancreatitis -End-stage renal disease on dialysis  Recommendations ---------------------- -Acetaminophen  level normal.  Hepatitis panel negative.  Normal AMA.  Normal alpha-1 antitrypsin level.  Normal ceruloplasmin.  Low  iron saturation and normal ferritin..   - ANA , ASMA pending   -Patient LFTs are improving.  INR trending down. -Repeat hepatic panel and INR in the morning.   -Vitamin K 10 mg p.o. -GI will follow   Otis Brace MD, [place] 02/08/2020, 10:08 AM  Contact #  [phone]

## 2020-02-08 NOTE — Progress Notes (Signed)
PROGRESS NOTE    Jonnatan Hanners  WRU:[id number] DOB: 1933-08-27 DOA: 02/17/2020 PCP: Seward Carol, MD   Brief Narrative:  86 year old with past medical history significant for GI bleed, status post cholecystectomy 2019, history of pancreatitis, anemia, aortic stenosis, heart block status post pacemaker, diabetes mellitus, ESRD on hemodialysis MWF, gout, history of CVA, hyperlipidemia, hypertension, prostate cancer, CAD who presented to the hospital with altered mental status.  Patient had a week of increasing weakness and fatigue with confusion.  Patient also had decreased oral intake and was constipated. He also had a right foot wound that he has been caring at home for the last 3 weeks.  He follows with Dr. Donnetta Hutching. In the ED, patient systolic blood pressure was low in the 80s, transaminases AST 578 ALT 439, leukocytosis, INR 2.1.  Ammonia 177.  Chest x-ray show some congestion.  CT abdomen showed bilateral 2 mm obstructing renal stone, numerous bilateral partially calcified and noncalcified renal cyst, diffuse urinary bladder wall thickening, multiple prostate radiation implantation seeds. Right upper Quadrant ultrasound showed Status post cholecystectomy, mild prominence of the common bile duct which may be due to post surgical dilation, not definite if shadowing stone, small amount of perihepatic ascites. Patient admitted with hepatic encephalopathy, hepatitis, concern for infection unclear source. .  Assessment & Plan:   Principal Problem:   Acute encephalopathy Active Problems:   Anemia   HTN (hypertension)   Hyperlipidemia   Heart block   Cardiac pacemaker in situ ([employer] DDD)   Leukocytosis   End-stage renal disease on hemodialysis (HCC)   HLD (hyperlipidemia)   Diabetes mellitus type 2, uncontrolled, with complications (HCC)   Anemia in chronic kidney disease   Hypothyroidism, unspecified   Hepatitis  Acute hepatic encephalopathy: Elevated ammonia on presentation.  Continue  oral lactulose.  CT head scan was without acute findings. continue to monitor closely.  Improving  Acute hepatitis, elevated LFTs, status post cholecystectomy 2019 Continue to monitor LFTs.  Hold statins.  CT did not show any focal abnormality.  Viral hepatitis panel was negative.  GI was consulted.  There was also concern for shock liver related to hypotension and underlying infection.  Likely ischemic colitis as per GI.  Normal AMA alpha-fetoprotein .  Will get 10 mg of vitamin K today.  Diabetes mellitus type 2. Seen presented with hypoglycemia.  Receiving D10.  Has been decreased to 35 mL/h   Leukocytosis, hypotension: Concern for infection.  Blood cultures negative so far.  On Rocephin.  Concern for possible foot infection.  Patient was getting IV antibiotics with dialysis for foot infection.  Chest x-ray showed cardiomegaly with vascular congestion.    ESRD on hemodialysis MWF: Neurology.Continue with calcitriol, cinacalcet, PhosLo.  Essential hypertension, aortic stenosis, heart block status post pacemaker: Continue with metoprolol, hold for hypotension.  Hyperlipidemia, history of CVA: Continue with aspirin.  Hold statins for now.  Right foot with wound, PVD Continue wound care.  Vascular surgery on board and does not believe foot as a source of infection.  Vascular surgery planning for angiogram at some point.  DVT prophylaxis:  SCDs  Code Status:  Full code  Family Communication:  None at bedside.  Disposition Plan:  Status is: Inpatient  Remains inpatient appropriate because:IV treatments appropriate due to intensity of illness or inability to take PO, IV antibiotics.  Dispo: The patient is from: Home              Anticipated d/c is to: To be determined  Anticipated d/c date is 2 to 3 days              Patient currently is not medically stable to d/c.  Consultants:  Vascular surgery GI Nephrology   Procedures:   Hemodialysis  Antimicrobials:   Rocephin IV 12/7> Vancomycin IV   Subjective: Today, patient was seen and examined during hemodialysis.  Complains of weakness and fatigue.   Objective: Vitals:   02/08/20 1023 02/08/20 1033 02/08/20 1100 02/08/20 1133  BP: (!) 151/87  127/76   Pulse: (!) 34  60 76  Resp: 17 16 16    Temp: (!) 97.5 F (36.4 C)  97.9 F (36.6 C)   TempSrc: Oral  Oral   SpO2: 100%  99%   Weight: 79.2 kg     Height:        Intake/Output Summary (Last 24 hours) at 02/08/2020 1449 Last data filed at 02/08/2020 1200 Gross per 24 hour  Intake 1003.78 ml  Output 1000 ml  Net 3.78 ml   Filed Weights   02/07/20 2126 02/08/20 0705 02/08/20 1023  Weight: 82.2 kg 80.3 kg 79.2 kg    Physical examination:  Body mass index is 29.06 kg/m.  General: Mildly somnolent,, not in obvious distress HENT:   No scleral pallor or icterus noted. Oral mucosa is moist.  Chest:  Clear breath sounds.  Diminished breath sounds bilaterally. No crackles or wheezes.  CVS: S1 &S2 heard. No murmur.  Regular rate and rhythm. Abdomen: Soft, nontender, nondistended.  Bowel sounds are heard.   Extremities: No cyanosis, clubbing or edema.  Peripheral pulses are palpable. Psych: Mildly somnolent, seen during hemodialysis, CNS:  No cranial nerve deficits.  Moving all extremities. Skin: Warm and dry.  Right foot with redness and discoloration  Data Reviewed: I have personally reviewed following labs and imaging studies  CBC: Recent Labs  Lab 02/22/2020 1312 02/06/20 0800 02/06/20 1201 02/07/20 0507 02/08/20 0102  WBC 15.8* 16.1* 16.2* 16.5* 14.5*  NEUTROABS 14.2*  --   --   --   --   HGB 13.0 12.8* 11.8* 12.1* 12.7*  HCT 44.1 40.5 38.6* 37.8* 40.5  MCV 87.8 84.9 84.3 83.6 82.3  PLT 225 171 164 134* 659*   Basic Metabolic Panel: Recent Labs  Lab 02/18/2020 1312 02/06/20 0800 02/07/20 0507 02/08/20 0102  NA 136 137 137 133*  K 4.6 4.7 3.8 3.7  CL 89* 89* 94* 93*  CO2 22 21* 23 24  GLUCOSE 79 111* 88 135*  BUN  33* 38* 23 28*  CREATININE 7.59* 8.57* 5.48* 5.95*  CALCIUM 8.8* 8.8* 8.3* 8.1*   GFR: Estimated Creatinine Clearance: 9 mL/min (A) (by C-G formula based on SCr of 5.95 mg/dL (H)). Liver Function Tests: Recent Labs  Lab 02/22/2020 1312 02/06/20 0800 02/07/20 0507 02/08/20 0102  AST 578* 1,382* 1,490* 672*  ALT 439* 860* 1,219* 909*  ALKPHOS 126 132* 136* 129*  BILITOT 1.5* 1.6* 1.6* 1.1  PROT 5.9* 5.9* 5.3* 5.1*  ALBUMIN 3.1* 2.9* 2.6* 2.4*   No results for input(s): LIPASE, AMYLASE in the last 168 hours. Recent Labs  Lab 02/14/2020 1748 02/06/20 0800 02/07/20 0903  AMMONIA 77* 49* 33   Coagulation Profile: Recent Labs  Lab 02/29/2020 1312 02/06/20 0800 02/07/20 0507 02/08/20 0102  INR 2.1* 2.6* 2.4* 1.9*   Cardiac Enzymes: Recent Labs  Lab 02/06/20 1042  CKTOTAL 182   BNP (last 3 results) No results for input(s): PROBNP in the last 8760 hours. HbA1C: No results for input(s):  HGBA1C in the last 72 hours. CBG: Recent Labs  Lab 02/06/20 1123 02/06/20 1416 02/07/20 0641 02/07/20 1206 02/07/20 1635  GLUCAP 98 85 74 110* 109*   Lipid Profile: No results for input(s): CHOL, HDL, LDLCALC, TRIG, CHOLHDL, LDLDIRECT in the last 72 hours. Thyroid Function Tests: No results for input(s): TSH, T4TOTAL, FREET4, T3FREE, THYROIDAB in the last 72 hours. Anemia Panel: Recent Labs    02/07/20 0507  FERRITIN 187  TIBC 234*  IRON 22*   Sepsis Labs: Recent Labs  Lab 02/06/20 1757 02/07/20 0507 02/08/20 0102  PROCALCITON 1.46 1.96 2.30    Recent Results (from the past 240 hour(s))  Blood culture (routine x 2)     Status: None (Preliminary result)   Collection Time: 02/19/2020  1:13 PM   Specimen: BLOOD  Result Value Ref Range Status   Specimen Description BLOOD SITE NOT SPECIFIED  Final   Special Requests AEROBIC BOTTLE ONLY Blood Culture adequate volume  Final   Culture   Final    NO GROWTH 3 DAYS Performed at [facility], [place] [address],  [place], [place] [address]    Report Status PENDING  Incomplete  Blood culture (routine x 2)     Status: None (Preliminary result)   Collection Time: 01/31/2020  1:15 PM   Specimen: BLOOD  Result Value Ref Range Status   Specimen Description BLOOD SITE NOT SPECIFIED  Final   Special Requests AEROBIC BOTTLE ONLY Blood Culture adequate volume  Final   Culture   Final    NO GROWTH 3 DAYS Performed at [facility], [place] [address], [place], [place] [address]    Report Status PENDING  Incomplete  Resp Panel by RT-PCR (Flu A&B, Covid) Nasopharyngeal Swab     Status: None   Collection Time: 02/11/2020  1:54 PM   Specimen: Nasopharyngeal Swab; Nasopharyngeal(NP) swabs in vial transport medium  Result Value Ref Range Status   SARS Coronavirus 2 by RT PCR NEGATIVE NEGATIVE Final    Comment: (NOTE) SARS-CoV-2 target nucleic acids are NOT DETECTED.  The SARS-CoV-2 RNA is generally detectable in upper respiratory specimens during the acute phase of infection. The lowest concentration of SARS-CoV-2 viral copies this assay can detect is 138 copies/mL. A negative result does not preclude SARS-Cov-2 infection and should not be used as the sole basis for treatment or other patient management decisions. A negative result may occur with  improper specimen collection/handling, submission of specimen other than nasopharyngeal swab, presence of viral mutation(s) within the areas targeted by this assay, and inadequate number of viral copies(<138 copies/mL). A negative result must be combined with clinical observations, patient history, and epidemiological information. The expected result is Negative.  Fact Sheet for Patients:  EntrepreneurPulse.com.au  Fact Sheet for Healthcare Providers:  IncredibleEmployment.be  This test is no t yet approved or cleared by the [place] FDA and  has been authorized for detection and/or diagnosis of SARS-CoV-2 by FDA under an  Emergency Use Authorization (EUA). This EUA will remain  in effect (meaning this test can be used) for the duration of the COVID-19 declaration under Section 564(b)(1) of the Act, 21 U.S.C.section 360bbb-3(b)(1), unless the authorization is terminated  or revoked sooner.       Influenza A by PCR NEGATIVE NEGATIVE Final   Influenza B by PCR NEGATIVE NEGATIVE Final    Comment: (NOTE) The Xpert Xpress SARS-CoV-2/FLU/RSV plus assay is intended as an aid in the diagnosis of influenza from Nasopharyngeal swab specimens and should  not be used as a sole basis for treatment. Nasal washings and aspirates are unacceptable for Xpert Xpress SARS-CoV-2/FLU/RSV testing.  Fact Sheet for Patients: EntrepreneurPulse.com.au  Fact Sheet for Healthcare Providers: IncredibleEmployment.be  This test is not yet approved or cleared by the [place] FDA and has been authorized for detection and/or diagnosis of SARS-CoV-2 by FDA under an Emergency Use Authorization (EUA). This EUA will remain in effect (meaning this test can be used) for the duration of the COVID-19 declaration under Section 564(b)(1) of the Act, 21 U.S.C. section 360bbb-3(b)(1), unless the authorization is terminated or revoked.  Performed at [facility], [place] [address] [place], [place] [address]          Radiology Studies: No results found.      Scheduled Meds: . aspirin  81 mg Oral Daily  . calcitRIOL  1 mcg Oral Q M,W,F-HD  . calcium acetate  2,001 mg Oral TID WC  . Chlorhexidine Gluconate Cloth  6 each Topical Q0600  . cinacalcet  90 mg Oral Q M,W,F  . ferrous sulfate  325 mg Oral Q breakfast  . lactulose  10 g Oral TID  . metoprolol tartrate  12.5 mg Oral BID  . sodium chloride flush  3 mL Intravenous Q12H   Continuous Infusions: . cefTRIAXone (ROCEPHIN)  IV 2 g (02/07/20 2209)  . dextrose 30 mL/hr at 02/07/20 1411  . [START ON 02/09/2020] vancomycin    .  vancomycin       LOS: 3 days   Flora Lipps, MD [facility] 02/08/2020, 2:49 PM

## 2020-02-09 DIAGNOSIS — Z66 Do not resuscitate: Secondary | ICD-10-CM

## 2020-02-09 DIAGNOSIS — K7290 Hepatic failure, unspecified without coma: Secondary | ICD-10-CM

## 2020-02-09 DIAGNOSIS — R7989 Other specified abnormal findings of blood chemistry: Secondary | ICD-10-CM

## 2020-02-09 DIAGNOSIS — Z515 Encounter for palliative care: Secondary | ICD-10-CM

## 2020-02-09 LAB — COMPREHENSIVE METABOLIC PANEL
ALT: 685 U/L — ABNORMAL HIGH (ref 0–44)
AST: 381 U/L — ABNORMAL HIGH (ref 15–41)
Albumin: 2.5 g/dL — ABNORMAL LOW (ref 3.5–5.0)
Alkaline Phosphatase: 134 U/L — ABNORMAL HIGH (ref 38–126)
Anion gap: 16 — ABNORMAL HIGH (ref 5–15)
BUN: 21 mg/dL (ref 8–23)
CO2: 26 mmol/L (ref 22–32)
Calcium: 7.9 mg/dL — ABNORMAL LOW (ref 8.9–10.3)
Chloride: 94 mmol/L — ABNORMAL LOW (ref 98–111)
Creatinine, Ser: 5.02 mg/dL — ABNORMAL HIGH (ref 0.61–1.24)
GFR, Estimated: 11 mL/min — ABNORMAL LOW (ref >60)
Glucose, Bld: 113 mg/dL — ABNORMAL HIGH (ref 70–99)
Potassium: 3.8 mmol/L (ref 3.5–5.1)
Sodium: 136 mmol/L (ref 135–145)
Total Bilirubin: 1.3 mg/dL — ABNORMAL HIGH (ref 0.3–1.2)
Total Protein: 5.4 g/dL — ABNORMAL LOW (ref 6.5–8.1)

## 2020-02-09 LAB — CBC
HCT: 40.4 % (ref 39.0–52.0)
Hemoglobin: 12.5 g/dL — ABNORMAL LOW (ref 13.0–17.0)
MCH: 25.6 pg — ABNORMAL LOW (ref 26.0–34.0)
MCHC: 30.9 g/dL (ref 30.0–36.0)
MCV: 82.8 fL (ref 80.0–100.0)
Platelets: 84 10*3/uL — ABNORMAL LOW (ref 150–400)
RBC: 4.88 MIL/uL (ref 4.22–5.81)
RDW: 21.4 % — ABNORMAL HIGH (ref 11.5–15.5)
WBC: 13.6 10*3/uL — ABNORMAL HIGH (ref 4.0–10.5)
nRBC: 0.2 % (ref 0.0–0.2)

## 2020-02-09 LAB — GLUCOSE, CAPILLARY
Glucose-Capillary: 99 mg/dL (ref 70–99)
Glucose-Capillary: 124 mg/dL — ABNORMAL HIGH (ref 70–99)
Glucose-Capillary: 65 mg/dL — ABNORMAL LOW (ref 70–99)
Glucose-Capillary: 91 mg/dL (ref 70–99)
Glucose-Capillary: 107 mg/dL — ABNORMAL HIGH (ref 70–99)

## 2020-02-09 LAB — PROTIME-INR
INR: 1.6 — ABNORMAL HIGH (ref 0.8–1.2)
Prothrombin Time: 18.4 seconds — ABNORMAL HIGH (ref 11.4–15.2)

## 2020-02-09 MED ORDER — DEXTROSE 50 % IV SOLN
INTRAVENOUS | Status: AC
  Administered 2020-02-09: 50 mL
  Filled 2020-02-09: qty 50

## 2020-02-09 MED ORDER — VANCOMYCIN HCL IN DEXTROSE 750-5 MG/150ML-% IV SOLN
INTRAVENOUS | Status: AC
  Administered 2020-02-09: 750 mg via INTRAVENOUS
  Filled 2020-02-09: qty 150

## 2020-02-09 NOTE — Progress Notes (Signed)
PROGRESS NOTE    Sean Best  MWU:[id number] DOB: Oct 04, 1933 DOA: 02/18/2020 PCP: Seward Carol, MD   Brief Narrative:  86 year old with past medical history significant for GI bleed, status post cholecystectomy 2019, history of pancreatitis, anemia, aortic stenosis, heart block status post pacemaker, diabetes mellitus, ESRD on hemodialysis MWF, gout, history of CVA, hyperlipidemia, hypertension, prostate cancer, CAD who presented to the hospital with altered mental status.  Patient had a week of increasing weakness and fatigue with confusion.  Patient also had decreased oral intake and was constipated. He also had a right foot wound that he has been caring at home for the last 3 weeks.  He follows with Dr. Donnetta Hutching. In the ED, patient systolic blood pressure was low in the 80s, transaminases AST 578 ALT 439, leukocytosis, INR 2.1.  Ammonia 177.  Chest x-ray show some congestion.  CT abdomen showed bilateral 2 mm obstructing renal stone, numerous bilateral partially calcified and noncalcified renal cyst, diffuse urinary bladder wall thickening, multiple prostate radiation implantation seeds. Right upper Quadrant ultrasound showed status post cholecystectomy, mild prominence of the common bile duct which may be due to post surgical dilation, not definite if shadowing stone, small amount of perihepatic ascites. Patient admitted with hepatic encephalopathy, hepatitis, concern for infection unclear source. .  Assessment & Plan:   Principal Problem:   Acute encephalopathy Active Problems:   Anemia   HTN (hypertension)   Hyperlipidemia   Heart block   Cardiac pacemaker in situ (Medtronic DDD)   Leukocytosis   End-stage renal disease on hemodialysis (HCC)   HLD (hyperlipidemia)   Diabetes mellitus type 2, uncontrolled, with complications (HCC)   Anemia in chronic kidney disease   Hypothyroidism, unspecified   Hepatitis  Acute hepatic encephalopathy: Elevated ammonia on presentation.  Continue  oral lactulose.  CT head scan was without acute findings. continue to monitor closely.  Improving. Normally he doesn't get confused and was fully independent to ADLs as per the daughter. He does have mild memory issues at baseline.  Dysphagia.  On dysphagia 1 diet.  On D10 as well.  Advance diet as tolerated and discontinue D10 when able.  Advance to soft diet today.  Acute hepatitis, elevated LFTs, status post cholecystectomy 2019 Continue to monitor LFTs.  Hold statins.  CT did not show any focal abnormality.  Viral hepatitis panel was negative.  GI on board. There was also concern for shock liver related to hypotension and underlying infection.  Likely ischemic hepatitis as per GI.  Normal AMA, alpha-fetoprotein .  Received oral vitamin K.  Trending down LFTs.  Hepatic Function Latest Ref Rng & Units 02/09/2020 02/08/2020 02/07/2020  Total Protein 6.5 - 8.1 g/dL 5.4(L) 5.1(L) 5.3(L)  Albumin 3.5 - 5.0 g/dL 2.5(L) 2.4(L) 2.6(L)  AST 15 - 41 U/L 381(H) 672(H) 1,490(H)  ALT 0 - 44 U/L 685(H) 909(H) 1,219(H)  Alk Phosphatase 38 - 126 U/L 134(H) 129(H) 136(H)  Total Bilirubin 0.3 - 1.2 mg/dL 1.3(H) 1.1 1.6(H)  Bilirubin, Direct 0.0 - 0.2 mg/dL - - -  .  Diabetes mellitus type 2. presented with hypoglycemia.  Receiving D10 at 35 mL/h discontinue D10 when oral intake adequate.   Leukocytosis, hypotension: Concern for infection.  Blood cultures negative so far.  On IV Rocephin and vancomycin.  Concern for possible foot infection.  Patient was getting IV antibiotics with dialysis for foot infection prior to coming to the hospital.  Chest x-ray showed cardiomegaly with vascular congestion.  T-max of 98.8 F.  Consider de-escalating antibiotic after  tomorrow.  ESRD on hemodialysis MWF: Nephrology on board.  Continue  hemodialysis as per schedule..Continue with calcitriol, cinacalcet, PhosLo.  Essential hypertension, aortic stenosis, heart block status post pacemaker: Continue with metoprolol,  monitor closely.  Latest blood pressure of 118/ 68.  Hyperlipidemia, history of CVA: Continue with aspirin.  Hold statins for now.  Right foot with wound, PVD Continue wound care.  Vascular surgery on board and does not believe foot as a source of infection.  Vascular surgery has plans for angiogram at some point.  DVT prophylaxis:  SCDs  Code Status:  Full code  Family Communication:  I spoke with the patient's daughter Ms [name] on the phone and updated her about the clinical condition of the patient. The patient has walker at home and lives with his daughter at home.   Disposition Plan:  Status is: Inpatient  Remains inpatient appropriate because:IV treatments appropriate due to intensity of illness or inability to take PO, IV antibiotics, likely need skilled nursing facility, possible angiogram.  Dispo: The patient is from: Home              Anticipated d/c is to: SNF as per PT evaluation.  Consult transition of care              Anticipated d/c date is 2 to 3 days              Patient currently is not medically stable to d/c.  Consultants:  Vascular surgery GI Nephrology   Procedures:   Hemodialysis  Antimicrobials:  Rocephin IV 12/7> Vancomycin IV   Subjective: Today, patient was seen and examined at hemodialysis unit.  Complains of generalized fatigue and weakness.  Slow to respond.  Answering questions.  Denies pain, nausea vomiting   Objective: Vitals:   02/09/20 0451 02/09/20 0650 02/09/20 0700 02/09/20 0730  BP: (!) 121/93 131/71 127/68 118/68  Pulse: 87 71 81 (!) 56  Resp: '18 17  19  ' Temp: 97.7 F (36.5 C) 97.7 F (36.5 C)    TempSrc: Oral Oral    SpO2: 95% 92%    Weight:  79.3 kg    Height:        Intake/Output Summary (Last 24 hours) at 02/09/2020 0800 Last data filed at 02/09/2020 0600 Gross per 24 hour  Intake 899.12 ml  Output 1000 ml  Net -100.88 ml   Filed Weights   02/08/20 1023 02/08/20 2025 02/09/20 0650  Weight: 79.2 kg 80.1 kg  79.3 kg    Physical examination:  Body mass index is 29.09 kg/m.  General: Alert awake and communicative, appears to be confused at times.  Oriented to place and time HENT:   No scleral pallor or icterus noted. Oral mucosa is moist.  Chest:  Clear breath sounds.  Diminished breath sounds bilaterally. No crackles or wheezes.  CVS: S1 &S2 heard. No murmur.  Regular rate and rhythm. Abdomen: Soft, nontender, nondistended.  Bowel sounds are heard.   Extremities: No cyanosis, clubbing or edema.  Peripheral pulses are palpable. Psych: Mildly somnolent, seen during hemodialysis, CNS:  No cranial nerve deficits.  Moving all extremities. Skin: Warm and dry.  Right foot cool with wounds.  Data Reviewed: I have personally reviewed following labs and imaging studies  CBC: Recent Labs  Lab 02/28/2020 1312 02/06/20 0800 02/06/20 1201 02/07/20 0507 02/08/20 0102 02/09/20 0234  WBC 15.8* 16.1* 16.2* 16.5* 14.5* 13.6*  NEUTROABS 14.2*  --   --   --   --   --  HGB 13.0 12.8* 11.8* 12.1* 12.7* 12.5*  HCT 44.1 40.5 38.6* 37.8* 40.5 40.4  MCV 87.8 84.9 84.3 83.6 82.3 82.8  PLT 225 171 164 134* 112* 84*   Basic Metabolic Panel: Recent Labs  Lab 02/15/2020 1312 02/06/20 0800 02/07/20 0507 02/08/20 0102 02/09/20 0234  NA 136 137 137 133* 136  K 4.6 4.7 3.8 3.7 3.8  CL 89* 89* 94* 93* 94*  CO2 22 21* '23 24 26  ' GLUCOSE 79 111* 88 135* 113*  BUN 33* 38* 23 28* 21  CREATININE 7.59* 8.57* 5.48* 5.95* 5.02*  CALCIUM 8.8* 8.8* 8.3* 8.1* 7.9*   GFR: Estimated Creatinine Clearance: 10.6 mL/min (A) (by C-G formula based on SCr of 5.02 mg/dL (H)). Liver Function Tests: Recent Labs  Lab 02/09/2020 1312 02/06/20 0800 02/07/20 0507 02/08/20 0102 02/09/20 0234  AST 578* 1,382* 1,490* 672* 381*  ALT 439* 860* 1,219* 909* 685*  ALKPHOS 126 132* 136* 129* 134*  BILITOT 1.5* 1.6* 1.6* 1.1 1.3*  PROT 5.9* 5.9* 5.3* 5.1* 5.4*  ALBUMIN 3.1* 2.9* 2.6* 2.4* 2.5*   No results for input(s): LIPASE,  AMYLASE in the last 168 hours. Recent Labs  Lab 02/02/2020 1748 02/06/20 0800 02/07/20 0903  AMMONIA 77* 49* 33   Coagulation Profile: Recent Labs  Lab 02/20/2020 1312 02/06/20 0800 02/07/20 0507 02/08/20 0102 02/09/20 0234  INR 2.1* 2.6* 2.4* 1.9* 1.6*   Cardiac Enzymes: Recent Labs  Lab 02/06/20 1042  CKTOTAL 182   BNP (last 3 results) No results for input(s): PROBNP in the last 8760 hours. HbA1C: No results for input(s): HGBA1C in the last 72 hours. CBG: Recent Labs  Lab 02/07/20 0641 02/07/20 1206 02/07/20 1635 02/08/20 2146 02/09/20 0633  GLUCAP 74 110* 109* 117* 99   Lipid Profile: No results for input(s): CHOL, HDL, LDLCALC, TRIG, CHOLHDL, LDLDIRECT in the last 72 hours. Thyroid Function Tests: No results for input(s): TSH, T4TOTAL, FREET4, T3FREE, THYROIDAB in the last 72 hours. Anemia Panel: Recent Labs    02/07/20 0507  FERRITIN 187  TIBC 234*  IRON 22*   Sepsis Labs: Recent Labs  Lab 02/06/20 1757 02/07/20 0507 02/08/20 0102  PROCALCITON 1.46 1.96 2.30    Recent Results (from the past 240 hour(s))  Blood culture (routine x 2)     Status: None (Preliminary result)   Collection Time: 02/09/2020  1:13 PM   Specimen: BLOOD  Result Value Ref Range Status   Specimen Description BLOOD SITE NOT SPECIFIED  Final   Special Requests AEROBIC BOTTLE ONLY Blood Culture adequate volume  Final   Culture   Final    NO GROWTH 3 DAYS Performed at [facility], [place] [address], [place], [place] [address]    Report Status PENDING  Incomplete  Blood culture (routine x 2)     Status: None (Preliminary result)   Collection Time: 02/27/2020  1:15 PM   Specimen: BLOOD  Result Value Ref Range Status   Specimen Description BLOOD SITE NOT SPECIFIED  Final   Special Requests AEROBIC BOTTLE ONLY Blood Culture adequate volume  Final   Culture   Final    NO GROWTH 3 DAYS Performed at [facility], [place] [address] [place], [place] [address]    Report  Status PENDING  Incomplete  Resp Panel by RT-PCR (Flu A&B, Covid) Nasopharyngeal Swab     Status: None   Collection Time: 02/10/2020  1:54 PM   Specimen: Nasopharyngeal Swab; Nasopharyngeal(NP) swabs in vial transport medium  Result Value  Ref Range Status   SARS Coronavirus 2 by RT PCR NEGATIVE NEGATIVE Final    Comment: (NOTE) SARS-CoV-2 target nucleic acids are NOT DETECTED.  The SARS-CoV-2 RNA is generally detectable in upper respiratory specimens during the acute phase of infection. The lowest concentration of SARS-CoV-2 viral copies this assay can detect is 138 copies/mL. A negative result does not preclude SARS-Cov-2 infection and should not be used as the sole basis for treatment or other patient management decisions. A negative result may occur with  improper specimen collection/handling, submission of specimen other than nasopharyngeal swab, presence of viral mutation(s) within the areas targeted by this assay, and inadequate number of viral copies(<138 copies/mL). A negative result must be combined with clinical observations, patient history, and epidemiological information. The expected result is Negative.  Fact Sheet for Patients:  EntrepreneurPulse.com.au  Fact Sheet for Healthcare Providers:  IncredibleEmployment.be  This test is no t yet approved or cleared by the [place] FDA and  has been authorized for detection and/or diagnosis of SARS-CoV-2 by FDA under an Emergency Use Authorization (EUA). This EUA will remain  in effect (meaning this test can be used) for the duration of the COVID-19 declaration under Section 564(b)(1) of the Act, 21 U.S.C.section 360bbb-3(b)(1), unless the authorization is terminated  or revoked sooner.       Influenza A by PCR NEGATIVE NEGATIVE Final   Influenza B by PCR NEGATIVE NEGATIVE Final    Comment: (NOTE) The Xpert Xpress SARS-CoV-2/FLU/RSV plus assay is intended as an aid in the diagnosis  of influenza from Nasopharyngeal swab specimens and should not be used as a sole basis for treatment. Nasal washings and aspirates are unacceptable for Xpert Xpress SARS-CoV-2/FLU/RSV testing.  Fact Sheet for Patients: EntrepreneurPulse.com.au  Fact Sheet for Healthcare Providers: IncredibleEmployment.be  This test is not yet approved or cleared by the [place] FDA and has been authorized for detection and/or diagnosis of SARS-CoV-2 by FDA under an Emergency Use Authorization (EUA). This EUA will remain in effect (meaning this test can be used) for the duration of the COVID-19 declaration under Section 564(b)(1) of the Act, 21 U.S.C. section 360bbb-3(b)(1), unless the authorization is terminated or revoked.  Performed at [facility], [place] [address] [place], [place] [address]        Radiology Studies: No results found.      Scheduled Meds: . aspirin  81 mg Oral Daily  . calcitRIOL  1 mcg Oral Q M,W,F-HD  . calcium acetate  2,001 mg Oral TID WC  . Chlorhexidine Gluconate Cloth  6 each Topical Q0600  . cinacalcet  90 mg Oral Q M,W,F  . ferrous sulfate  325 mg Oral Q breakfast  . lactulose  10 g Oral TID  . metoprolol tartrate  12.5 mg Oral BID  . sodium chloride flush  3 mL Intravenous Q12H   Continuous Infusions: . cefTRIAXone (ROCEPHIN)  IV Stopped (02/09/20 0005)  . dextrose 30 mL/hr at 02/09/20 0407  . vancomycin       LOS: 4 days   Flora Lipps, MD [facility] 02/09/2020,

## 2020-02-09 NOTE — Consult Note (Signed)
Consultation Note Date: 02/09/2020   Patient Name: Sean Best  DOB: 1933/09/07  MRN: [id number]  Age / Sex: 86 y.o., male  PCP: Sean Carol, MD Referring Physician: Flora Lipps, MD  Reason for Consultation: Establishing goals of care  HPI/Patient Profile: 86 y.o. male  with past medical history of CVA, prostate CA, CAD, AS, pacemaker placement, ESRD on HD, GIB, pancreatitis, claudication and left foot wound who was admitted on 02/23/2020 with altered mental status, decreased PO and worsening foot wound.  He was found to have elevated LFTs.  He was hypoglycemic and has required D10 infusion.  Vascular Surgery, nephrology and GI are following.   Per GI he is felt to have ischemic hepatitis and chronic pancreatitis.  Clinical Assessment and Goals of Care:  I have reviewed medical records including EPIC notes, labs and imaging, received report from the care team, examined the patient and met at bedside with his daughter Sean Best [facility]) to discuss diagnosis prognosis, [place], EOL wishes, disposition and options.  I introduced Palliative Medicine as specialized medical care for people living with serious illness. It focuses on providing relief from the symptoms and stress of a serious illness.   We discussed a brief life review of the patient.  His wife has passed away.  His daughter Sean Best and grand daughter live with him and care for him.  He has 7 children.  Two of which are his biological children and the other 5 were his wife's from a prior marriage - he raised all of them as his own.  He is a [place] man.  As far as functional and nutritional status he was independent with ADLs and eating well until recently.  We discussed his current illness and what it means in the larger context of his on-going co-morbidities.  Natural disease trajectory and expectations at EOL were discussed.  Sean Best  understands that her father's kidneys do not work and now having liver failure may significantly change his prognosis.  We discussed Sean Best's need for D10 infusion.  He will have to be able to eat and maintain his blood sugar in order to sustain himself.  Advanced directives, concepts specific to code status, artifical feeding and hydration, and rehospitalization were considered and discussed.  Sean Best would not want her father resuscitated if he arrested.  She does want to continue full scope treatment.  More conversation would be needed if a temporary feeding tube were to be considered.  Sean Best and her daughter have lived with Sean Best for years and cared for him.  Sean Best would prefer that he not go to SNF.  If possible she would like to care for him at home.  Hospice and Palliative Care services outpatient were explained and offered.  Sean Best is very open to support from Hospice and Palliative Care.  As Sean Best progresses thru his hospitalization we will determine what is most appropriate.  Sean Best and her daughter have lived with Sean Best for years and cared for him.  Sean Best would prefer that he not  go to SNF  Questions and concerns were addressed.  The family was encouraged to call with questions or concerns.    Primary Decision Maker:  NEXT OF KIN, daughter Sean Best    SUMMARY OF RECOMMENDATIONS    Code status changed to DNR PMT will continue to follow with you to assess for services, symptoms and provide support for family. If at all possible Sean Best would prefer to care for father in the home rather than SNF  Code Status/Advance Care Planning:  DNR   Symptom Management:   Comfortable.   Additional Recommendations (Limitations, Scope, Preferences):  Full Scope Treatment.   Would want further discussion prior to placing temporary feeding tube if this will be considered.  Likely no permanent PEG.  Palliative Prophylaxis:   Delirium Protocol  Psycho-social/Spiritual:   Desire  for further Chaplaincy support: Not discussed.  Prognosis:  Unable to determine.  If he is unable to maintain his glucose his prognosis will be very short.    Discharge Planning: To Be Determined      Primary Diagnoses: Present on Admission: . Acute encephalopathy . Anemia . Anemia in chronic kidney disease . Diabetes mellitus type 2, uncontrolled, with complications ([place]) . Heart block . Cardiac pacemaker in situ (Medtronic DDD) . HLD (hyperlipidemia) . HTN (hypertension) . Hypothyroidism, unspecified . Leukocytosis . Hyperlipidemia   I have reviewed the medical record, interviewed the patient and family, and examined the patient. The following aspects are pertinent.  Past Medical History:  Diagnosis Date  . Anemia   . Arthritis    "knees" (04/11/2016)  . Blind left eye    S/P trauma  . CHF (congestive heart failure) ([place])   . Chronic total occlusion of artery of the extremities (HCC)    pt not aware of this  . Claudication ([place])   . ESRD on dialysis [facility])    "MWF; [name]" ((04/10/2016)  . ESRD on peritoneal dialysis [facility])    Started dialysis around April 2015 per son.  Has been doing peritoneal dialysis at home.     . Gout   . Hyperlipidemia   . Hypertension   . Overweight(278.02)   . Presence of permanent cardiac pacemaker    medtronic  . Prostate cancer ([place]) 1990s  . Shingles   . Stroke [facility]) ~1998   denies residual on 04/11/2016  . Type II diabetes mellitus (HCC)    diet controlled  . Umbilical hernia 09/18/9468   Social History   Socioeconomic History  . Marital status: Widowed    Spouse name: Not on file  . Number of children: 2  . Years of education: Not on file  . Highest education level: Not on file  Occupational History  . Occupation: Retired  Tobacco Use  . Smoking status: Never Smoker  . Smokeless tobacco: Never Used  Vaping Use  . Vaping Use: Never used  Substance and Sexual Activity  . Alcohol use: No  . Drug use: No  .  Sexual activity: Not Currently  Other Topics Concern  . Not on file  Social History Narrative   Patient lives with his daughter.   Social Determinants of Health   Financial Resource Strain: Not on file  Food Insecurity: Not on file  Transportation Needs: Not on file  Physical Activity: Not on file  Stress: Not on file  Social Connections: Not on file   Family History  Problem Relation Age of Onset  . Cancer Mother   . Heart disease Mother   . Cancer  Father     Allergies  Allergen Reactions  . Cardura [Doxazosin Mesylate] Other (See Comments)    Hallucinations  . Tape Dermatitis    Paper tape ok to use        Vital Signs: BP 116/83 (BP Location: Right Arm)   Pulse 81   Temp (!) 97.3 F (36.3 C) (Oral)   Resp 15   Ht '5\' 5"'  (1.651 m)   Wt 77.3 kg   SpO2 92%   BMI 28.36 kg/m  Pain Scale: 0-10   Pain Score: 0-No pain   SpO2: SpO2: 92 % O2 Device:SpO2: 92 % O2 Flow Rate: .O2 Flow Rate (L/min): 2 L/min    Palliative Assessment/Data:  20%     Time In: 4:00 Time Out: 5:00 Time Total: 60 min Visit consisted of counseling and education dealing with the complex and emotionally intense issues surrounding the need for palliative care and symptom management in the setting of serious and potentially life-threatening illness. Greater than 50%  of this time was spent counseling and coordinating care related to the above assessment and plan.  Signed by: Florentina Jenny, PA-C Palliative Medicine  Please contact Palliative Medicine Team phone at [phone] for questions and concerns.  For individual provider: See Shea Evans

## 2020-02-09 NOTE — Progress Notes (Signed)
Hypoglycemic Event  CBG: 65 @ 1144  Treatment: D50 50 mL (25 gm)  Symptoms: None   Follow-up CBG: Time: 1218  CBG Result: 124  Possible Reasons for Event: Inadequate meal intake  Comments/MD notified: n/a    Sean Best

## 2020-02-09 NOTE — Progress Notes (Signed)
[facility] Gastroenterology Progress Note  Sean Best 86 y.o. 1934-01-19  CC: Abnormal LFTs, chronic pancreatitis   Subjective: Patient seen and examined at bedside.  Feeling better.  Denies any GI symptoms.  ROS : Negative for confusion.  Negative for chest pain.   Objective: Vital signs in last 24 hours: Vitals:   02/09/20 1005 02/09/20 1211  BP: 124/72 116/83  Pulse:  81  Resp: 16 15  Temp: 97.7 F (36.5 C) (!) 97.3 F (36.3 C)  SpO2: 93% 92%    Physical Exam:  General:  Alert, cooperative, no distress, appears stated age  Head:  Normocephalic, without obvious abnormality, atraumatic  Eyes:  , EOM's intact,   Lungs:    No respiratory distress noted  Heart:  Regular rate and rhythm, S1, S2 normal  Abdomen:   Soft, mild right upper quadrant tenderness to palpation, soft, bowel sounds present.  No peritoneal signs  Psych  mood and affect normal       Lab Results: Recent Labs    02/08/20 0102 02/09/20 0234  NA 133* 136  K 3.7 3.8  CL 93* 94*  CO2 24 26  GLUCOSE 135* 113*  BUN 28* 21  CREATININE 5.95* 5.02*  CALCIUM 8.1* 7.9*   Recent Labs    02/08/20 0102 02/09/20 0234  AST 672* 381*  ALT 909* 685*  ALKPHOS 129* 134*  BILITOT 1.1 1.3*  PROT 5.1* 5.4*  ALBUMIN 2.4* 2.5*   Recent Labs    02/08/20 0102 02/09/20 0234  WBC 14.5* 13.6*  HGB 12.7* 12.5*  HCT 40.5 40.4  MCV 82.3 82.8  PLT 112* 84*   Recent Labs    02/08/20 0102 02/09/20 0234  LABPROT 21.4* 18.4*  INR 1.9* 1.6*      Assessment/Plan: -Abnormal LFTs with transaminases more than 1000 on admission..  Total bilirubin 1.6.  Most likely ischemic hepatitis.   Denies any significant alcohol use.  -Encephalopathy.  Improving -Chronic pancreatitis -End-stage renal disease on dialysis  Recommendations ---------------------- -Acetaminophen level normal.  Hepatitis panel negative.  Normal AMA.  Normal alpha-1 antitrypsin level.  Normal ceruloplasmin.  Low  iron saturation and normal  ferritin..   - ANA , ASMA negative  -Patient LFTs are improving.  INR trending down. -Continue supportive care.  GI will follow up on Monday if patient remains hospitalized.   Otis Brace MD, [place] 02/09/2020, 12:19 PM  Contact #  [phone]

## 2020-02-09 NOTE — Progress Notes (Signed)
  Speech Language Pathology Treatment: Dysphagia  Patient Details Name: Sean Best MRN: [id number] DOB: 1933/09/07 Today's Date: 02/09/2020 Time: 9476-5465 SLP Time Calculation (min) (ACUTE ONLY): 18 min  Assessment / Plan / Recommendation Clinical Impression  Pt was seen for dysphagia treatment with his daughter present for part of the session. Pt's daughter reported that the pt typically needs his foods to be soft and that meats must be chopped. Per the pt's daughter, the pt has not liked the pureed diet and she has only been able to have him eat puree. She was advised that ~50% of lunch was completed and she was pleased by this. Seth Bake, RN denied pt having any signs of aspiration with p.o. intake. A dysphagia 1 diet was recommended by speech pathology on 12/8 and pt's diet was advanced to soft on 12/10 by Dr. Louanne Belton. Pt was seen with dinner tray which consisted of beef stir fry and rice. Mastication was significantly prolonged with soft solids. Pt did not swallow these solids despite adequate mastication and cueing to swallow. Mastication was more functional with dysphagia 2 solids and he independently swallowed boluses. Mild lingual residue was noted. No s/sx of aspiration were noted with solids or with thin liquids via straw. A dysphagia 2 diet with thin liquids is recommended at this time. SLP will continue to follow pt.    HPI HPI: 86 year old with past medical history significant for GI bleed, status post cholecystectomy 2019, history of pancreatitis, anemia, aortic stenosis, heart block status post pacemaker, diabetes, ESRD on hemodialysis MWF, gout, history of CVA, hyperlipidemia, hypertension, prostate cancer, CAD who presents with altered mental status.  Patient has had a week of increasing weakness and fatigue and now presents with confusion.  He has been constipated, decreased oral intake.  He has right foot wound that he has been caring at home for the last 3 weeks.        SLP Plan   Continue with current plan of care       Recommendations  Diet recommendations: Dysphagia 2 (fine chop);Thin liquid Liquids provided via: Straw;Cup Medication Administration: Crushed with puree Supervision: Staff to assist with self feeding Compensations: Slow rate;Small sips/bites;Minimize environmental distractions Postural Changes and/or Swallow Maneuvers: Seated upright 90 degrees;Upright 30-60 min after meal                Oral Care Recommendations: Oral care BID Follow up Recommendations: Skilled Nursing facility SLP Visit Diagnosis: Dysphagia, oropharyngeal phase (R13.12) Plan: Continue with current plan of care       Lalena Salas I. Hardin Negus, [place], [place] Office number [phone] Pager [place] 02/09/2020, 4:32 PM

## 2020-02-09 NOTE — Progress Notes (Addendum)
[facility] Progress Note   Subjective:   Seen and examined at bedside in dialysis.  Tolerateing HD well so far, no cramping.  Denies CP, SOB, dizziness and n/v/d.   Objective Vitals:   02/09/20 0650 02/09/20 0700 02/09/20 0730 02/09/20 0800  BP: 131/71 127/68 118/68 125/71  Pulse: 71 81 (!) 56 73  Resp: 17  19 (!) 27  Temp: 97.7 F (36.5 C)     TempSrc: Oral     SpO2: 92%     Weight: 79.3 kg     Height:       Physical Exam General:chronically ill appearing male in NAD, pleasantly confused Heart:RRR Lungs:CTAB anterolaterally  Abdomen:soft, NTND Extremities:trace LE edema, R foot wrapped Dialysis Access: aneurysmal LU AVF cannulated    Filed Weights   02/08/20 1023 02/08/20 2025 02/09/20 0650  Weight: 79.2 kg 80.1 kg 79.3 kg    Intake/Output Summary (Last 24 hours) at 02/09/2020 0823 Last data filed at 02/09/2020 0600 Gross per 24 hour  Intake 899.12 ml  Output 1000 ml  Net -100.88 ml    Additional Objective Labs: Basic Metabolic Panel: Recent Labs  Lab 02/07/20 0507 02/08/20 0102 02/09/20 0234  NA 137 133* 136  K 3.8 3.7 3.8  CL 94* 93* 94*  CO2 23 24 26   GLUCOSE 88 135* 113*  BUN 23 28* 21  CREATININE 5.48* 5.95* 5.02*  CALCIUM 8.3* 8.1* 7.9*   Liver Function Tests: Recent Labs  Lab 02/07/20 0507 02/08/20 0102 02/09/20 0234  AST 1,490* 672* 381*  ALT 1,219* 909* 685*  ALKPHOS 136* 129* 134*  BILITOT 1.6* 1.1 1.3*  PROT 5.3* 5.1* 5.4*  ALBUMIN 2.6* 2.4* 2.5*   CBC: Recent Labs  Lab 02/08/2020 1312 02/06/20 0800 02/06/20 1201 02/07/20 0507 02/08/20 0102 02/09/20 0234  WBC 15.8* 16.1* 16.2* 16.5* 14.5* 13.6*  NEUTROABS 14.2*  --   --   --   --   --   HGB 13.0 12.8* 11.8* 12.1* 12.7* 12.5*  HCT 44.1 40.5 38.6* 37.8* 40.5 40.4  MCV 87.8 84.9 84.3 83.6 82.3 82.8  PLT 225 171 164 134* 112* 84*   Blood Culture    Component Value Date/Time   SDES BLOOD SITE NOT SPECIFIED 02/27/2020 1315   SPECREQUEST AEROBIC BOTTLE ONLY  Blood Culture adequate volume 03/01/2020 1315   CULT  02/18/2020 1315    NO GROWTH 4 DAYS Performed at [facility], [place] [address], [place], [place] [address]    REPTSTATUS PENDING 02/09/2020 1315    Cardiac Enzymes: Recent Labs  Lab 02/06/20 1042  CKTOTAL 182   CBG: Recent Labs  Lab 02/07/20 0641 02/07/20 1206 02/07/20 1635 02/08/20 2146 02/09/20 0633  GLUCAP 74 110* 109* 117* 99   Iron Studies:  Recent Labs    02/07/20 0507  IRON 22*  TIBC 234*  FERRITIN 187   Lab Results  Component Value Date   INR 1.6 (H) 02/09/2020   INR 1.9 (H) 02/08/2020   INR 2.4 (H) 02/07/2020   Studies/Results: No results found.  Medications: . cefTRIAXone (ROCEPHIN)  IV Stopped (02/09/20 0005)  . dextrose 30 mL/hr at 02/09/20 0407  . Vancomycin    . vancomycin     . aspirin  81 mg Oral Daily  . calcitRIOL  1 mcg Oral Q M,W,F-HD  . calcium acetate  2,001 mg Oral TID WC  . Chlorhexidine Gluconate Cloth  6 each Topical Q0600  . cinacalcet  90 mg Oral Q M,W,F  . ferrous sulfate  325 mg Oral Q breakfast  . lactulose  10 g Oral TID  . metoprolol tartrate  12.5 mg Oral BID  . sodium chloride flush  3 mL Intravenous Q12H    Dialysis Orders: [facility]   MWF, 4 hours, 78 kg EDW, 2K, 2 calcium bath, UF profile 2, left forearm AV fistula access, no heparin, no ESA, calcitriol 0.75 mcg p.o. daily  Assessment/Plan: 1. ESRD -HD on MWF. Off schedule HD yesterday due to high patient schedule, back on regular schedule today. Truncated HD due to high patient census.  K 3.8. No Heparin. 2. Encephalopathy -elevated ammonia and elevated LFTs improving.work-up per admit/ GI. known history of pancreatitis. hypoglycemia 33 in ED.  CT head no acute findings.  BC NGTD. On lactulose.  3. Hypertension/volume - initial hypotension, BP now in goal. On low dose lopressor. Initial CXR with some vascular congestion.  Does not appear grossly volume overloaded. Not quite to EDW if weights correct,  UF as able today 4. Anemia -Hgb stable >12 no ESA indicated 5. Metabolic bone disease - CCa in goal. Will check phos.  Continue binders, sensipar and VDRA.  6. Nutrition -Albumin 2.5noted pured diet per speech/protein supplement 7. Right lower extremity foot wounds per Dr. Luther Parody team noted patient/daughter previously refused angiogram unless wounds worsen,placed on IV antibiotics wound care.  Possible angiogram when stable.  8. Liver failure with coagulopathy encephalopathy=GI seeing work-up in progress and lactulose enemas twice daily patient. Given vitamin K for coagulopathy, will hold heparin on hemodialysis. 9. Diabetes mellitus type 2 glycemic on admission=plan per admit team to currently on D10 dripat 82ml/hr   Jen Mow, PA-C [place] 02/09/2020,8:23 AM  LOS: 4 days   Nephrology attending: Patient was seen and examined personally and I agree with assessment and plan as outlined above. ESRD on HD regular dialysis today.  Tolerating well.  Hemoglobin above goal.  Monitor calcium phosphorus level.  Liver enzymes are trending down.  Vascular planning for angiogram.  Katheran James, MD [facility].

## 2020-02-09 NOTE — Progress Notes (Addendum)
[facility]  Subjective  - Seems fairly alert this am.   Objective 118/68 (!) 56 97.7 F (36.5 C) (Oral) 19 92%  Intake/Output Summary (Last 24 hours) at 02/09/2020 0803 Last data filed at 02/09/2020 0600 Gross per 24 hour  Intake 899.12 ml  Output 1000 ml  Net -100.88 ml    Right foot cool with dry wounds on all toes, motor intact without cellulitis.  Palpable femoral pulses Lungs non labored breathing ON HD  No acute distress  Assessment/Planning: 86 y.o. male with R foot wounds  Cont. Care with daily betadine paints to right foot toes. Possible angiogram when stable.  Roxy Horseman 02/09/2020 8:03 AM --  Laboratory Lab Results: Recent Labs    02/08/20 0102 02/09/20 0234  WBC 14.5* 13.6*  HGB 12.7* 12.5*  HCT 40.5 40.4  PLT 112* 84*   BMET Recent Labs    02/08/20 0102 02/09/20 0234  NA 133* 136  K 3.7 3.8  CL 93* 94*  CO2 24 26  GLUCOSE 135* 113*  BUN 28* 21  CREATININE 5.95* 5.02*  CALCIUM 8.1* 7.9*    COAG Lab Results  Component Value Date   INR 1.6 (H) 02/09/2020   INR 1.9 (H) 02/08/2020   INR 2.4 (H) 02/07/2020   No results found for: PTT  Following from the sidelines.  We will not see over the weekend.  Will see again next week.  Please call for vascular concerns over the weekend

## 2020-02-10 ENCOUNTER — Inpatient Hospital Stay (HOSPITAL_COMMUNITY): Payer: Medicare HMO

## 2020-02-10 DIAGNOSIS — Z515 Encounter for palliative care: Secondary | ICD-10-CM

## 2020-02-10 DIAGNOSIS — D72829 Elevated white blood cell count, unspecified: Secondary | ICD-10-CM

## 2020-02-10 DIAGNOSIS — J9601 Acute respiratory failure with hypoxia: Secondary | ICD-10-CM

## 2020-02-10 LAB — BLOOD GAS, ARTERIAL
Acid-Base Excess: 2.8 mmol/L — ABNORMAL HIGH (ref 0.0–2.0)
Bicarbonate: 25.6 mmol/L (ref 20.0–28.0)
Drawn by: 441371
FIO2: 100
O2 Saturation: 97.1 %
Patient temperature: 36.8
pCO2 arterial: 31.4 mmHg — ABNORMAL LOW (ref 32.0–48.0)
pH, Arterial: 7.522 — ABNORMAL HIGH (ref 7.350–7.450)
pO2, Arterial: 88.7 mmHg (ref 83.0–108.0)

## 2020-02-10 LAB — CBC
HCT: 37.8 % — ABNORMAL LOW (ref 39.0–52.0)
Hemoglobin: 12.3 g/dL — ABNORMAL LOW (ref 13.0–17.0)
MCH: 26.4 pg (ref 26.0–34.0)
MCHC: 32.5 g/dL (ref 30.0–36.0)
MCV: 81.1 fL (ref 80.0–100.0)
Platelets: 90 10*3/uL — ABNORMAL LOW (ref 150–400)
RBC: 4.66 MIL/uL (ref 4.22–5.81)
RDW: 21.5 % — ABNORMAL HIGH (ref 11.5–15.5)
WBC: 12.8 10*3/uL — ABNORMAL HIGH (ref 4.0–10.5)
nRBC: 0.3 % — ABNORMAL HIGH (ref 0.0–0.2)

## 2020-02-10 LAB — CULTURE, BLOOD (ROUTINE X 2)
Culture: NO GROWTH
Culture: NO GROWTH
Special Requests: ADEQUATE
Special Requests: ADEQUATE

## 2020-02-10 LAB — COMPREHENSIVE METABOLIC PANEL
ALT: 510 U/L — ABNORMAL HIGH (ref 0–44)
AST: 239 U/L — ABNORMAL HIGH (ref 15–41)
Albumin: 2.5 g/dL — ABNORMAL LOW (ref 3.5–5.0)
Alkaline Phosphatase: 128 U/L — ABNORMAL HIGH (ref 38–126)
Anion gap: 14 (ref 5–15)
BUN: 20 mg/dL (ref 8–23)
CO2: 26 mmol/L (ref 22–32)
Calcium: 8 mg/dL — ABNORMAL LOW (ref 8.9–10.3)
Chloride: 94 mmol/L — ABNORMAL LOW (ref 98–111)
Creatinine, Ser: 4.52 mg/dL — ABNORMAL HIGH (ref 0.61–1.24)
GFR, Estimated: 12 mL/min — ABNORMAL LOW (ref >60)
Glucose, Bld: 132 mg/dL — ABNORMAL HIGH (ref 70–99)
Potassium: 3.9 mmol/L (ref 3.5–5.1)
Sodium: 134 mmol/L — ABNORMAL LOW (ref 135–145)
Total Bilirubin: 1.3 mg/dL — ABNORMAL HIGH (ref 0.3–1.2)
Total Protein: 5.4 g/dL — ABNORMAL LOW (ref 6.5–8.1)

## 2020-02-10 LAB — GLUCOSE, CAPILLARY
Glucose-Capillary: 123 mg/dL — ABNORMAL HIGH (ref 70–99)
Glucose-Capillary: 121 mg/dL — ABNORMAL HIGH (ref 70–99)
Glucose-Capillary: 119 mg/dL — ABNORMAL HIGH (ref 70–99)

## 2020-02-10 LAB — PROTIME-INR
INR: 1.5 — ABNORMAL HIGH (ref 0.8–1.2)
Prothrombin Time: 17.8 seconds — ABNORMAL HIGH (ref 11.4–15.2)

## 2020-02-10 MED ORDER — NEPRO/CARBSTEADY PO LIQD
237.0000 mL | Freq: Two times a day (BID) | ORAL | Status: DC
  Administered 2020-02-10 – 2020-02-13 (×4): 237 mL via ORAL

## 2020-02-10 NOTE — Progress Notes (Addendum)
Daily Progress Note   Patient Name: Sean Best       Date: 02/10/2020 DOB: Aug 27, 1933  Age: 86 y.o. MRN#: [id number] Attending Physician: Marcell Anger* Primary Care Physician: Seward Carol, MD Admit Date: 02/16/2020  Reason for Consultation/Follow-up:To discuss complex medical decision making related to patient's goals of care  Visited patient at bedside.  Two RNs are in the room working with him as patient is hypoxic and requiring additional oxygen. Discussed patient with Dr. Wyonia Hough.   Subjective: Sean Best (dtr) on the phone.  Explained that patient is still requiring the D10 to maintain his serum glucose, but at this time he is becoming hypoxic.  Dr. Wyonia Hough is ordering a chest xray to determine if he is fluid overloaded.  Sean Best and I discussed ischemic hepatitis.  I explained the physiology - this was likely caused by low blood pressure.   We will need to give Sean Best a couple of days to determine if he can maintain his blood sugar and tolerate hemodialysis given his episodes of hypotension.  Assessment: Pleasant gentleman, confused.  Hypoxic this morning.  Still requiring D10 IVF to maintain blood glucose.   Patient Profile/HPI:  86 y.o. male  with past medical history of CVA, prostate CA, CAD, AS, pacemaker placement, ESRD on HD, GIB, pancreatitis, claudication and left foot wound who was admitted on 02/12/2020 with altered mental status, decreased PO and worsening foot wound.  He was found to have elevated LFTs.  He was hypoglycemic and has required D10 infusion.  Vascular Surgery, nephrology and GI are following.   Per GI he is felt to have ischemic hepatitis and chronic pancreatitis.    Length of Stay: 5   Vital Signs: BP 102/68 (BP Location: Right  Arm)   Pulse 67   Temp 98.2 F (36.8 C) (Oral)   Resp 18   Ht 5\' 5"  (1.651 m)   Wt 78.6 kg   SpO2 93%   BMI 28.84 kg/m  SpO2: SpO2: 93 % O2 Device: O2 Device: Nasal Cannula O2 Flow Rate: O2 Flow Rate (L/min): 5 L/min       Palliative Assessment/Data:  20%     Palliative Care Plan    Recommendations/Plan:  Continue full scope treatment.  PMT will continue to follow with you.  Family's preference would be to avoid SNF.  They would like to take him home if possible.  I have not talked to [facility] about what care can be provided in the home.  If patient declines family Sean Best) will consider Hospice.  Code Status:  DNR  Prognosis:   Unable to determine   Discharge Planning:  To Be Determined  Care plan was discussed with [facility] MD, Dr. Wyonia Hough  Thank you for allowing the Palliative Medicine Team to assist in the care of this patient.  Total time spent:  35 min.     Greater than 50%  of this time was spent counseling and coordinating care related to the above assessment and plan.  Florentina Jenny, PA-C Palliative Medicine  Please contact Palliative MedicineTeam phone at [phone] for questions and concerns between 7 am - 7 pm.   Please see AMION for individual provider pager numbers.

## 2020-02-10 NOTE — Progress Notes (Signed)
PROGRESS NOTE    Sean Best  MLY:[id number] DOB: Nov 06, 1933 DOA: 02/17/2020 PCP: Seward Carol, MD   Brief Narrative:  86 year old with past medical history significant for GI bleed, status post cholecystectomy 2019, history of pancreatitis, anemia, aortic stenosis, heart block status post pacemaker, diabetes mellitus, ESRD on hemodialysis MWF, gout, history of CVA, hyperlipidemia, hypertension, prostate cancer, CAD who presented to the hospital with altered mental status.  Patient had a week of increasing weakness and fatigue with confusion.  Patient also had decreased oral intake and was constipated. He also had a right foot wound that he has been caring at home for the last 3 weeks.  He follows with Dr. Donnetta Hutching. In the ED, patient systolic blood pressure was low in the 80s, transaminases AST 578 ALT 439, leukocytosis, INR 2.1.  Ammonia 177.  Chest x-ray show some congestion.  CT abdomen showed bilateral 2 mm obstructing renal stone, numerous bilateral partially calcified and noncalcified renal cyst, diffuse urinary bladder wall thickening, multiple prostate radiation implantation seeds. Right upper Quadrant ultrasound showed status post cholecystectomy, mild prominence of the common bile duct which may be due to post surgical dilation, not definite if shadowing stone, small amount of perihepatic ascites. Patient admitted with hepatic encephalopathy, hepatitis, concern for infection unclear source.   Assessment & Plan:   Principal Problem:   Acute encephalopathy Active Problems:   Anemia   HTN (hypertension)   Hyperlipidemia   Heart block   Cardiac pacemaker in situ ([employer] DDD)   Leukocytosis   End-stage renal disease on hemodialysis ([facility])   HLD (hyperlipidemia)   Diabetes mellitus type 2, uncontrolled, with complications (HCC)   Anemia in chronic kidney disease   Hypothyroidism, unspecified   Hepatitis   Palliative care encounter  Acute hypoxic resp failure:  Patient with  hypoxia this morning rapidly recovers with supplemental O2 X-ray without acute volume overload or pneumonia identified ABGs pending We will transfer patient to progressive for possible BiPAP to ease work of breathing Discussed the case with nephrology who reviewed patient's for possible dialysis did not feel he needed emergent volume removal, 2 L removed yesterday  Acute hepatic encephalopathy: Elevated ammonia on presentation.  Continue oral lactulose.  CT head scan was without acute findings. continue to monitor closely.  Improving. Normally he doesn't get confused and was fully independent to ADLs as per the daughter. He does have mild memory issues at baseline.  Dysphagia.  On dysphagia 1 diet.  On D10 as well.  Advance diet as tolerated and discontinue D10 when able.  Advance to soft diet today.  Acute hepatitis, elevated LFTs, status post cholecystectomy 2019 Continue to monitor LFTs.  Hold statins.  CT did not show any focal abnormality.  Viral hepatitis panel was negative.  GI on board. There was also concern for shock liver related to hypotension and underlying infection.  Likely ischemic hepatitis as per GI.  Normal AMA, alpha-fetoprotein .  Received oral vitamin K.    LFTs continue trending down  Diabetes mellitus type 2. presented with hypoglycemia.  Receiving D10 at 35 mL/h discontinue D10 when oral intake adequate.   Leukocytosis, hypotension: Concern for infection.  Blood cultures negative so far.  On IV Rocephin and vancomycin.  Concern for possible foot infection.  Patient was getting IV antibiotics with dialysis for foot infection prior to coming to the hospital.  Chest x-ray showed cardiomegaly with vascular congestion will repeat now stable.  T-max of 98.8 F.  Consider de-escalating antibiotic after tomorrow.  ESRD on hemodialysis  MWF: Nephrology on board.  Continue  hemodialysis as per schedule..Continue with calcitriol, cinacalcet, PhosLo.  Essential hypertension,  aortic stenosis, heart block status post pacemaker: Continue with metoprolol, monitor closely.  Latest blood pressure of 118/ 68.  Hyperlipidemia, history of CVA: Continue with aspirin.  Hold statins for now.  Right foot with wound, PVD Continue wound care.  Vascular surgery on board and does not believe foot as a source of infection.  Vascular surgery has plans for angiogram at some point.   DVT prophylaxis: SCD/Compression stockings  Code Status: DNR    Code Status Orders  (From admission, onward)         Start     Ordered   02/09/20 1652  Do not attempt resuscitation (DNR)  Continuous       Question Answer Comment  In the event of cardiac or respiratory ARREST Do not call a "code blue"   In the event of cardiac or respiratory ARREST Do not perform Intubation, CPR, defibrillation or ACLS   In the event of cardiac or respiratory ARREST Use medication by any route, position, wound care, and other measures to relive pain and suffering. May use oxygen, suction and manual treatment of airway obstruction as needed for comfort.      02/09/20 1651        Code Status History    Date Active Date Inactive Code Status Order ID Comments User Context   02/09/2020 2111 02/09/2020 1651 Full Code [id number]  Marcelyn Bruins, MD ED   09/20/2019 1758 09/26/2019 2007 Full Code [id number]  Allie Bossier, MD ED   10/16/2018 0040 10/18/2018 1758 Full Code [id number]  Bonnell Public, MD Inpatient   04/27/2018 1838 04/30/2018 1850 Full Code [id number]  Merton Border, MD ED   02/10/2018 1242 02/13/2018 1747 Full Code [id number]  Reubin Milan, MD ED   02/10/2018 1242 02/10/2018 1242 Full Code [id number]  Reubin Milan, MD ED   12/06/2017 2016 12/08/2017 2255 Full Code [id number]  Shela Leff, MD ED   12/02/2017 1254 12/03/2017 2116 Full Code [id number]  Michael Boston, MD Inpatient   04/17/2017 2209 04/23/2017 2011 Full Code [id number]  Cristy Folks, MD Inpatient   04/10/2016 2201 04/12/2016  1654 Full Code [id number]  Benito Mccreedy, MD Inpatient   12/28/2014 2023 12/30/2014 1438 Full Code [id number]  Nita Sells, MD Inpatient   12/10/2013 0212 12/14/2013 1716 Full Code [id number]  Modena Jansky, MD ED   03/11/2013 1921 03/18/2013 2059 Full Code [id number]  Delfina Redwood, MD Inpatient   [facility] Care Planning Activity     Family Communication: CALLED DAUGHTER, NA LM  Disposition Plan:   Patient remained inpatient, patient is not medically safe for discharge with hypoxic respiratory failure. Consults called: None Admission status: Inpatient   Consultants:   None  Procedures:  CT ABDOMEN PELVIS WO CONTRAST  Result Date: 02/07/2020 CLINICAL DATA:  Abdominal distension with altered mental status and constipation. EXAM: CT ABDOMEN AND PELVIS WITHOUT CONTRAST TECHNIQUE: Multidetector CT imaging of the abdomen and pelvis was performed following the standard protocol without IV contrast. COMPARISON:  September 20, 2019 FINDINGS: Lower chest: No acute abnormality. Hepatobiliary: No focal liver abnormality is seen. Status post cholecystectomy. No biliary dilatation. Pancreas: Unremarkable. No pancreatic ductal dilatation or surrounding inflammatory changes. Spleen: Normal in size without focal abnormality. Adrenals/Urinary Tract: Adrenal glands are unremarkable. Kidneys are normal in size, without obstructing renal calculi or hydronephrosis. Numerous bilateral partially calcified and noncalcified cysts are seen within both kidneys  kidneys. Bilateral 2 mm nonobstructing renal stones are seen. The urinary bladder is contracted and subsequently limited in evaluation. Diffuse thick uretero bladder wall thickening is suspected. This is seen on the prior study. Stomach/Bowel: Stomach is within normal limits. Appendix appears normal. No evidence of bowel wall thickening, distention, or inflammatory changes. Vascular/Lymphatic: Aortic atherosclerosis with stable 3.1 cm infrarenal aneurysmal  dilatation. No enlarged abdominal or pelvic lymph nodes. Reproductive: Multiple prostate radiation implantation seeds are seen. Other: No abdominal wall hernia or abnormality. No abdominopelvic ascites. Musculoskeletal: Marked severity chronic and multilevel degenerative changes are seen throughout the lumbar spine. IMPRESSION: 1. Bilateral 2 mm nonobstructing renal stones. 2. Numerous bilateral partially calcified and noncalcified renal cysts. 3. Diffuse urinary bladder wall thickening which is seen on the prior study and may represent sequelae associated with cystitis. 4. Multiple prostate radiation implantation seeds. 5. Aortic atherosclerosis. Aortic Atherosclerosis (ICD10-I70.0). Electronically Signed   By: Virgina Norfolk M.D.   On: 02/23/2020 21:09   DG Abdomen 1 View  Result Date: 02/17/2020 CLINICAL DATA:  Weakness, no bowel movement for 1 week EXAM: ABDOMEN - 1 VIEW COMPARISON:  September 20, 2019 FINDINGS: Brachytherapy seeds in the prostate. Surgical clips in the RIGHT upper quadrant compatible with prior cholecystectomy. Upper abdomen excluded from view.  Stomach with mild distension. Gas and stool in the rectum. Question of mild haustral thickening of the distal transverse colon. No signs of small bowel obstruction. Faint calcification overlies the LEFT abdomen more lateral than expected for ureteral calculus though ureteral calculus not excluded measuring 4 mm. Evidence of calcified atheromatous plaque in the abdominal aorta extending in the iliac vessels. On limited assessment no acute skeletal process. IMPRESSION: 1. Question of some haustral thickening of the distal transverse colon, findings could be seen in the setting of colitis. No sign of bowel obstruction. 2. Potential mid LEFT ureteral calculus. 3. Upper abdomen excluded from view 4. Signs of aortoiliac atherosclerosis. Electronically Signed   By: Zetta Bills M.D.   On: 02/01/2020 13:06   CT HEAD WO CONTRAST  Result Date:  02/29/2020 CLINICAL DATA:  Decreased level of consciousness EXAM: CT HEAD WITHOUT CONTRAST TECHNIQUE: Contiguous axial images were obtained from the base of the skull through the vertex without intravenous contrast. COMPARISON:  10/17/2018 FINDINGS: Brain: There is atrophy and chronic small vessel disease changes. No acute intracranial abnormality. Specifically, no hemorrhage, hydrocephalus, mass lesion, acute infarction, or significant intracranial injury. Vascular: No hyperdense vessel or unexpected calcification. Skull: No acute calvarial abnormality. Sinuses/Orbits: No acute findings Other: None IMPRESSION: Atrophy, chronic microvascular disease. No acute intracranial abnormality. Electronically Signed   By: Rolm Baptise M.D.   On: 03/01/2020 14:05   US Abdomen Limited  Result Date: 02/28/2020 CLINICAL DATA:  Elevated LFTs EXAM: ULTRASOUND ABDOMEN LIMITED RIGHT UPPER QUADRANT COMPARISON:  None. FINDINGS: Gallbladder: The patient is status post cholecystectomy. No biliary ductal dilation. Common bile duct: Diameter: 1 cm Liver: No focal lesion identified. Within normal limits in parenchymal echogenicity. Portal vein is patent on color Doppler imaging with normal direction of blood flow towards the liver. Other: Small amount of perihepatic ascites is seen. IMPRESSION: Status post cholecystectomy. Mild prominence of the common bile duct which may be due to postsurgical dilation, no definite shadowing stones are seen. Small amount of perihepatic ascites. Electronically Signed   By: Prudencio Pair M.D.   On: 02/28/2020 19:53   DG CHEST PORT 1 VIEW  Result Date: 02/10/2020 CLINICAL DATA:  Hypoxia. EXAM: PORTABLE CHEST 1 VIEW COMPARISON:  February 05, 2020. FINDINGS: Stable cardiomegaly. No pneumothorax or pleural effusion is noted. Lungs are clear. Right-sided pacemaker is unchanged in position. Bony thorax is unremarkable. IMPRESSION: No active disease. Electronically Signed   By: Marijo Conception M.D.   On:  02/10/2020 10:45   DG Chest Portable 1 View  Result Date: 02/04/2020 CLINICAL DATA:  Weakness for 1 week, no bowel movements for 1 week EXAM: PORTABLE CHEST 1 VIEW COMPARISON:  March 29, 2019 FINDINGS: Trachea midline. Heart size remains enlarged. RIGHT-sided pacer device, 2 lead with power pack over the RIGHT chest with similar appearance. Engorgement of central pulmonary vasculature. No lobar consolidation or pleural effusion. On limited assessment no acute skeletal process. IMPRESSION: Cardiomegaly with vascular congestion. Electronically Signed   By: Zetta Bills M.D.   On: 02/23/2020 13:03   DG Foot Complete Right  Result Date: 01/24/2020 CLINICAL DATA:  Right foot swelling EXAM: RIGHT FOOT COMPLETE - 3+ VIEW COMPARISON:  None. FINDINGS: There is no evidence of fracture or dislocation. There is no evidence of arthropathy or other focal bone abnormality. Soft tissues are unremarkable. IMPRESSION: Negative. Electronically Signed   By: Ulyses Jarred M.D.   On: 01/24/2020 00:19     Antimicrobials:   See MAR   Subjective: Patient was hypoxic this morning, obtain EKG which showed a paced rhythm, Supplemental O2, stat chest x-ray without volume excess noted Placed transferred to progressive care  Objective: Vitals:   02/09/20 1831 02/09/20 2019 02/10/20 0437 02/10/20 0924  BP: 112/76 115/64 116/60 102/68  Pulse: 61 60 60 67  Resp: 18 18 18    Temp: 98 F (36.7 C) 98.2 F (36.8 C) 98 F (36.7 C) 98.2 F (36.8 C)  TempSrc:  Oral Oral Oral  SpO2: 91% 91% 92% 93%  Weight:   78.6 kg   Height:        Intake/Output Summary (Last 24 hours) at 02/10/2020 1232 Last data filed at 02/10/2020 0900 Gross per 24 hour  Intake 653.59 ml  Output 0 ml  Net 653.59 ml   Filed Weights   02/09/20 0650 02/09/20 1005 02/10/20 0437  Weight: 79.3 kg 77.3 kg 78.6 kg    Examination:  General exam: Mildly short of breath but no acute rapid decompensation Respiratory system: Clear to  auscultation. Respiratory effort normal. Cardiovascular system: S1 & S2 heard, RRR. No JVD, murmurs, rubs, gallops or clicks. No pedal edema. Gastrointestinal system: Abdomen is nondistended, soft and nontender. No organomegaly or masses felt. Normal bowel sounds heard. Central nervous system: Alert and oriented. No focal neurological deficits. Extremities: Warm well perfused trace edema bilateral lower extremities Skin: No new rashes, lesions or ulcers Psychiatry: Judgement and insight appear normal although limited from likely early cognitive impairment. Mood & affect flat    Data Reviewed: I have personally reviewed following labs and imaging studies  CBC: Recent Labs  Lab 02/24/2020 1312 02/06/20 0800 02/06/20 1201 02/07/20 0507 02/08/20 0102 02/09/20 0234 02/10/20 0453  WBC 15.8*   < > 16.2* 16.5* 14.5* 13.6* 12.8*  NEUTROABS 14.2*  --   --   --   --   --   --   HGB 13.0   < > 11.8* 12.1* 12.7* 12.5* 12.3*  HCT 44.1   < > 38.6* 37.8* 40.5 40.4 37.8*  MCV 87.8   < > 84.3 83.6 82.3 82.8 81.1  PLT 225   < > 164 134* 112* 84* 90*   < > = values in this interval not displayed.   Basic  Metabolic Panel: Recent Labs  Lab 02/06/20 0800 02/07/20 0507 02/08/20 0102 02/09/20 0234 02/10/20 0453  NA 137 137 133* 136 134*  K 4.7 3.8 3.7 3.8 3.9  CL 89* 94* 93* 94* 94*  CO2 21* 23 24 26 26   GLUCOSE 111* 88 135* 113* 132*  BUN 38* 23 28* 21 20  CREATININE 8.57* 5.48* 5.95* 5.02* 4.52*  CALCIUM 8.8* 8.3* 8.1* 7.9* 8.0*   GFR: Estimated Creatinine Clearance: 11.8 mL/min (A) (by C-G formula based on SCr of 4.52 mg/dL (H)). Liver Function Tests: Recent Labs  Lab 02/06/20 0800 02/07/20 0507 02/08/20 0102 02/09/20 0234 02/10/20 0453  AST 1,382* 1,490* 672* 381* 239*  ALT 860* 1,219* 909* 685* 510*  ALKPHOS 132* 136* 129* 134* 128*  BILITOT 1.6* 1.6* 1.1 1.3* 1.3*  PROT 5.9* 5.3* 5.1* 5.4* 5.4*  ALBUMIN 2.9* 2.6* 2.4* 2.5* 2.5*   No results for input(s): LIPASE, AMYLASE in  the last 168 hours. Recent Labs  Lab 02/03/2020 1748 02/06/20 0800 02/07/20 0903  AMMONIA 77* 49* 33   Coagulation Profile: Recent Labs  Lab 02/06/20 0800 02/07/20 0507 02/08/20 0102 02/09/20 0234 02/10/20 0453  INR 2.6* 2.4* 1.9* 1.6* 1.5*   Cardiac Enzymes: Recent Labs  Lab 02/06/20 1042  CKTOTAL 182   BNP (last 3 results) No results for input(s): PROBNP in the last 8760 hours. HbA1C: No results for input(s): HGBA1C in the last 72 hours. CBG: Recent Labs  Lab 02/09/20 1218 02/09/20 1657 02/09/20 2110 02/10/20 0603 02/10/20 1119  GLUCAP 124* 91 107* 123* 121*   Lipid Profile: No results for input(s): CHOL, HDL, LDLCALC, TRIG, CHOLHDL, LDLDIRECT in the last 72 hours. Thyroid Function Tests: No results for input(s): TSH, T4TOTAL, FREET4, T3FREE, THYROIDAB in the last 72 hours. Anemia Panel: No results for input(s): VITAMINB12, FOLATE, FERRITIN, TIBC, IRON, RETICCTPCT in the last 72 hours. Sepsis Labs: Recent Labs  Lab 02/06/20 1757 02/07/20 0507 02/08/20 0102  PROCALCITON 1.46 1.96 2.30    Recent Results (from the past 240 hour(s))  Blood culture (routine x 2)     Status: None   Collection Time: 02/06/2020  1:13 PM   Specimen: BLOOD  Result Value Ref Range Status   Specimen Description BLOOD SITE NOT SPECIFIED  Final   Special Requests AEROBIC BOTTLE ONLY Blood Culture adequate volume  Final   Culture   Final    NO GROWTH 5 DAYS Performed at [facility], [address] [place], [place] [address]    Report Status 02/10/2020 FINAL  Final  Blood culture (routine x 2)     Status: None   Collection Time: 02/12/2020  1:15 PM   Specimen: BLOOD  Result Value Ref Range Status   Specimen Description BLOOD SITE NOT SPECIFIED  Final   Special Requests AEROBIC BOTTLE ONLY Blood Culture adequate volume  Final   Culture   Final    NO GROWTH 5 DAYS Performed at [facility], [place] [address] [place], [place] [address]    Report Status 02/10/2020 FINAL   Final  Resp Panel by RT-PCR (Flu A&B, Covid) Nasopharyngeal Swab     Status: None   Collection Time: 02/07/2020  1:54 PM   Specimen: Nasopharyngeal Swab; Nasopharyngeal(NP) swabs in vial transport medium  Result Value Ref Range Status   SARS Coronavirus 2 by RT PCR NEGATIVE NEGATIVE Final    Comment: (NOTE) SARS-CoV-2 target nucleic acids are NOT DETECTED.  The SARS-CoV-2 RNA is generally detectable in upper respiratory specimens during the  acute phase of infection. The lowest concentration of SARS-CoV-2 viral copies this assay can detect is 138 copies/mL. A negative result does not preclude SARS-Cov-2 infection and should not be used as the sole basis for treatment or other patient management decisions. A negative result may occur with  improper specimen collection/handling, submission of specimen other than nasopharyngeal swab, presence of viral mutation(s) within the areas targeted by this assay, and inadequate number of viral copies(<138 copies/mL). A negative result must be combined with clinical observations, patient history, and epidemiological information. The expected result is Negative.  Fact Sheet for Patients:  EntrepreneurPulse.com.au  Fact Sheet for Healthcare Providers:  IncredibleEmployment.be  This test is no t yet approved or cleared by the [place] FDA and  has been authorized for detection and/or diagnosis of SARS-CoV-2 by FDA under an Emergency Use Authorization (EUA). This EUA will remain  in effect (meaning this test can be used) for the duration of the COVID-19 declaration under Section 564(b)(1) of the Act, 21 U.S.C.section 360bbb-3(b)(1), unless the authorization is terminated  or revoked sooner.       Influenza A by PCR NEGATIVE NEGATIVE Final   Influenza B by PCR NEGATIVE NEGATIVE Final    Comment: (NOTE) The Xpert Xpress SARS-CoV-2/FLU/RSV plus assay is intended as an aid in the diagnosis of influenza from  Nasopharyngeal swab specimens and should not be used as a sole basis for treatment. Nasal washings and aspirates are unacceptable for Xpert Xpress SARS-CoV-2/FLU/RSV testing.  Fact Sheet for Patients: EntrepreneurPulse.com.au  Fact Sheet for Healthcare Providers: IncredibleEmployment.be  This test is not yet approved or cleared by the [place] FDA and has been authorized for detection and/or diagnosis of SARS-CoV-2 by FDA under an Emergency Use Authorization (EUA). This EUA will remain in effect (meaning this test can be used) for the duration of the COVID-19 declaration under Section 564(b)(1) of the Act, 21 U.S.C. section 360bbb-3(b)(1), unless the authorization is terminated or revoked.  Performed at [facility], [place] [address], [place], [place] [address]          Radiology Studies: DG CHEST PORT 1 VIEW  Result Date: 02/10/2020 CLINICAL DATA:  Hypoxia. EXAM: PORTABLE CHEST 1 VIEW COMPARISON:  February 05, 2020. FINDINGS: Stable cardiomegaly. No pneumothorax or pleural effusion is noted. Lungs are clear. Right-sided pacemaker is unchanged in position. Bony thorax is unremarkable. IMPRESSION: No active disease. Electronically Signed   By: Marijo Conception M.D.   On: 02/10/2020 10:45        Scheduled Meds: . aspirin  81 mg Oral Daily  . calcitRIOL  1 mcg Oral Q M,W,F-HD  . calcium acetate  2,001 mg Oral TID WC  . Chlorhexidine Gluconate Cloth  6 each Topical Q0600  . cinacalcet  90 mg Oral Q M,W,F  . ferrous sulfate  325 mg Oral Q breakfast  . lactulose  10 g Oral TID  . metoprolol tartrate  12.5 mg Oral BID  . sodium chloride flush  3 mL Intravenous Q12H   Continuous Infusions: . cefTRIAXone (ROCEPHIN)  IV Stopped (02/10/20 0026)  . dextrose 30 mL/hr at 02/09/20 1223  . vancomycin Stopped (02/09/20 0955)     LOS: 5 days    Time spent: [place], MD [facility]  If 7PM-7AM,  please contact night-coverage  02/10/2020, 12:32 PM

## 2020-02-10 NOTE — Progress Notes (Signed)
Patient's oxygen saturation displayed inconsistency going from a reading of 76-94 and then dropping back down in the 70s again while on 5L of oxygen nasal cannula. MD was notified and new orders have been placed. RN will implement new orders and continue to monitor this patient. Orders were placed for non rebreather, MD was notified that this med surg unit does not implement the non rebreather and MD gave verbal order to place patient on a face mask. RN will continue to monitor this patient

## 2020-02-10 NOTE — Progress Notes (Signed)
Subjective: Seen in room at bedside, noted was hypoxic this a.m. with normal chest x-ray, currently with rebreather mask tells me he is not dyspneic no change in confusion. Had dialysis yesterday and also palliative care discussed with daughter was made DNR yesterday continue to require D10 IVF to maintain his blood glucose  Objective Vital signs in last 24 hours: Vitals:   02/09/20 1831 02/09/20 2019 02/10/20 0437 02/10/20 0924  BP: 112/76 115/64 116/60 102/68  Pulse: 61 60 60 67  Resp: 18 18 18    Temp: 98 F (36.7 C) 98.2 F (36.8 C) 98 F (36.7 C) 98.2 F (36.8 C)  TempSrc:  Oral Oral Oral  SpO2: 91% 91% 92% 93%  Weight:   78.6 kg   Height:       Weight change: -3 kg  Physical Exam General:chronically ill appearing male currently NAD, pleasantly confused unchanged Heart:RRR, Lungs: Decreased breath sounds bilaterally, currently on nonrebreather mask nonlabored Abdomen:soft, NTND Extremities:trace LE edema, R foot dry wounds Dialysis Access:  Positive bruit LU AVF   OP Dialysis Orders: [facility]   MWF, 4 hours, 78 kg EDW, 2K, 2 calcium bath, UF profile 2, left forearm AV fistula access, no heparin, no ESA, calcitriol 0.75 mcg p.o. daily  Problem/Plan: 1.  ESRD -HD on MWF. Yesterday HD now on schedule. 1.988 mL UF, no Heparin. 2. Encephalopathy -elevated ammonia and elevated LFTs improving.work-up per admit/ GI. known history of pancreatitis. hypoglycemia 33 in ED.  CT head no acute findings.  BC NGTD. On lactulose. Noted seen by palliative and discussed with daughter made DNR yesterday this a.m. palliative note" if patient declines will consider hospice " continue HD until further discussion with family 3. Hypoxemia this morning= requiring rebreather mask chest x-ray shows no acute disease no need for dialysis dialysis yesterday, work-up per admit 4. Hypertension/volume - initial admit hypotension, BP now in goal. On low dose lopressor. This a.m. chest x-ray no active  disease initial CXR with some vascular congestion. 5. Anemia -Hgb stable 12.3no ESA indicated 6. Metabolic bone disease - CCa in goal. Will check phos.  Continue binders, sensipar and VDRA.  7. Nutrition -Albumin 2.5noted pured diet per speech/protein supplement 8. Right lower extremity foot wounds per Dr. Luther Parody team noted patient/daughter previously refused angiogram unless wounds worsen,placed on IV antibiotics wound care.  Possible angiogram when stable.  9. Liver failure with coagulopathy encephalopathy=GI seeing work-up in progress and lactulose enemas twice daily patient. Givenvitamin K for coagulopathy, will hold heparin on hemodialysis. 10. Diabetes mellitus type 2 hypoglycemic on admission=plan per admit team continued on  D10 dripat 64ml/hr  Ernest Haber, PA-C [place] [phone] 02/10/2020,11:14 AM  LOS: 5 days   Labs: Basic Metabolic Panel: Recent Labs  Lab 02/08/20 0102 02/09/20 0234 02/10/20 0453  NA 133* 136 134*  K 3.7 3.8 3.9  CL 93* 94* 94*  CO2 24 26 26   GLUCOSE 135* 113* 132*  BUN 28* 21 20  CREATININE 5.95* 5.02* 4.52*  CALCIUM 8.1* 7.9* 8.0*   Liver Function Tests: Recent Labs  Lab 02/08/20 0102 02/09/20 0234 02/10/20 0453  AST 672* 381* 239*  ALT 909* 685* 510*  ALKPHOS 129* 134* 128*  BILITOT 1.1 1.3* 1.3*  PROT 5.1* 5.4* 5.4*  ALBUMIN 2.4* 2.5* 2.5*   No results for input(s): LIPASE, AMYLASE in the last 168 hours. Recent Labs  Lab 03/01/2020 1748 02/06/20 0800 02/07/20 0903  AMMONIA 77* 49* 33   CBC: Recent Labs  Lab 02/17/2020 1312 02/06/20  0800 02/06/20 1201 02/07/20 0507 02/08/20 0102 02/09/20 0234 02/10/20 0453  WBC 15.8*   < > 16.2* 16.5* 14.5* 13.6* 12.8*  NEUTROABS 14.2*  --   --   --   --   --   --   HGB 13.0   < > 11.8* 12.1* 12.7* 12.5* 12.3*  HCT 44.1   < > 38.6* 37.8* 40.5 40.4 37.8*  MCV 87.8   < > 84.3 83.6 82.3 82.8 81.1  PLT 225   < > 164 134* 112* 84* 90*   < > = values in  this interval not displayed.   Cardiac Enzymes: Recent Labs  Lab 02/06/20 1042  CKTOTAL 182   CBG: Recent Labs  Lab 02/09/20 1144 02/09/20 1218 02/09/20 1657 02/09/20 2110 02/10/20 0603  GLUCAP 65* 124* 91 107* 123*    Studies/Results: DG CHEST PORT 1 VIEW  Result Date: 02/10/2020 CLINICAL DATA:  Hypoxia. EXAM: PORTABLE CHEST 1 VIEW COMPARISON:  February 05, 2020. FINDINGS: Stable cardiomegaly. No pneumothorax or pleural effusion is noted. Lungs are clear. Right-sided pacemaker is unchanged in position. Bony thorax is unremarkable. IMPRESSION: No active disease. Electronically Signed   By: Marijo Conception M.D.   On: 02/10/2020 10:45   Medications: . cefTRIAXone (ROCEPHIN)  IV Stopped (02/10/20 0026)  . dextrose 30 mL/hr at 02/09/20 1223  . vancomycin Stopped (02/09/20 0955)   . aspirin  81 mg Oral Daily  . calcitRIOL  1 mcg Oral Q M,W,F-HD  . calcium acetate  2,001 mg Oral TID WC  . Chlorhexidine Gluconate Cloth  6 each Topical Q0600  . cinacalcet  90 mg Oral Q M,W,F  . ferrous sulfate  325 mg Oral Q breakfast  . lactulose  10 g Oral TID  . metoprolol tartrate  12.5 mg Oral BID  . sodium chloride flush  3 mL Intravenous Q12H

## 2020-02-10 NOTE — Progress Notes (Addendum)
EKG order was completed however EKG machine was not able to print or transfer the information to the chart, MD was at the bedside and able to interpret the EKG in person. MD also gave verbal order to discontinue 10% dextrose IV infusion. RN will continue to monitor this patient

## 2020-02-11 DIAGNOSIS — J9621 Acute and chronic respiratory failure with hypoxia: Secondary | ICD-10-CM

## 2020-02-11 LAB — GLUCOSE, CAPILLARY
Glucose-Capillary: 86 mg/dL (ref 70–99)
Glucose-Capillary: 77 mg/dL (ref 70–99)

## 2020-02-11 NOTE — NC FL2 (Signed)
[place] LEVEL OF CARE SCREENING TOOL     IDENTIFICATION  Patient Name: Sean Best Birthdate: 1933/07/12 Sex: male Admission Date (Current Location): 02/15/2020  [facility] and [place] Number:  Herbalist and Address:  [facility], [place] [address] [place], [place] [address]      Provider Number: [id number]  Attending Physician Name and Address:  Marcell Anger*  Relative Name and Phone Number:  [name] [phone]    Current Level of Care: Hospital Recommended Level of Care: [place] Prior Approval Number:    Date Approved/Denied:   PASRR Number: [id number]  Discharge Plan: SNF    Current Diagnoses: Patient Active Problem List   Diagnosis Date Noted  . Palliative care encounter   . Acute encephalopathy 02/24/2020  . Hepatitis 02/26/2020  . End-stage renal disease on hemodialysis ([place]) 09/20/2019  . HLD (hyperlipidemia) 09/20/2019  . Prostate cancer ([place]) 09/20/2019  . Umbilical hernia 38/11/1749  . Acute pancreatitis 09/20/2019  . History of noncompliance with medical treatment 09/20/2019  . Diabetes mellitus type 2, uncontrolled, with complications ([place]) 02/58/5277  . Acute GI bleeding 09/20/2019  . Unspecified protein-calorie malnutrition ([place]) 08/04/2019  . Shoulder pain, bilateral 11/22/2018  . Impingement syndrome of right shoulder 11/08/2018  . Hypertensive emergency 10/16/2018  . Hypertensive encephalopathy 10/15/2018  . Allergy, unspecified, initial encounter 10/05/2018  . Anaphylactic shock, unspecified, initial encounter 10/05/2018  . Other fatigue 07/06/2018  . Headache 06/10/2018  . Left bundle-branch block, unspecified 02/14/2018  . Left bundle branch block (LBBB) determined by electrocardiography 02/13/2018  . Aortic stenosis 02/13/2018  . NSTEMI (non-ST elevated myocardial infarction) ([place]) 02/13/2018  . Constipation 12/08/2017  . Abdominal pain 12/06/2017  . Leukocytosis  12/06/2017  . Gout 12/06/2017  . Chronic pain 12/06/2017  . Acute on chronic cholecystitis s/p lap cholecystectomy 12/02/2017 12/02/2017  . Cardiac pacemaker in situ ([employer] DDD) 12/02/2017  . Encounter for immunization 11/24/2017  . Abnormality of albumin 09/22/2017  . Secondary hyperparathyroidism of renal origin ([place]) 09/20/2017  . Unspecified Escherichia coli (E. coli) as the cause of diseases classified elsewhere 09/15/2017  . Chills (without fever) 09/06/2017  . Hypercalcemia 05/18/2017  . Anemia in chronic kidney disease 05/03/2017  . Fever, unspecified 04/30/2017  . Iron deficiency anemia, unspecified 04/30/2017  . Other specified coagulation defects ([place]) 04/30/2017  . Pruritus, unspecified 04/30/2017  . Shortness of breath 04/30/2017  . Type 2 diabetes mellitus with diabetic peripheral angiopathy without gangrene ([place]) 04/30/2017  . Acute hypoxemic respiratory failure ([place]) 04/18/2017  . Heart block 04/17/2017  . Elevated troponin 04/17/2017  . ESRD on hemodialysis ([place]) 04/12/2016  . H/O: stroke 04/11/2016  . Saccular aneurysm of infrarenal aorta with thrombosed dissection of abdominal aorta 04/11/2016  . Obesity (BMI 30-39.9) 04/11/2016  . Syncope 04/11/2016  . Hypothyroidism, unspecified 05/01/2015  . Prostate CA ([place]) 12/28/2014  . Chest pain 12/28/2014  . C. difficile colitis 10/04/2014  . Hyperkalemia 03/11/2013  . Anemia 03/11/2013  . DM (diabetes mellitus), type 2 with renal complications, diet controlled 03/11/2013  . HTN (hypertension) 03/11/2013  . Hyperlipidemia 03/11/2013  . ESRD on dialysis ([place]) 03/20/2011    Orientation RESPIRATION BLADDER Height & Weight     Self,Place  O2 (4L) Incontinent Weight: 172 lb 6.4 oz (78.2 kg) Height:  5\' 5"  (165.1 cm)  BEHAVIORAL SYMPTOMS/MOOD NEUROLOGICAL BOWEL NUTRITION STATUS      Incontinent Diet (See discharge summary)  AMBULATORY STATUS COMMUNICATION OF NEEDS Skin   Extensive  Assist Verbally Skin abrasions  (diabetic ulcer toe)                       Personal Care Assistance Level of Assistance  Bathing,Feeding,Dressing Bathing Assistance: Maximum assistance Feeding assistance: Limited assistance Dressing Assistance: Maximum assistance     Functional Limitations Info  Sight,Hearing,Speech Sight Info: Adequate Hearing Info: Adequate Speech Info: Adequate    SPECIAL CARE FACTORS FREQUENCY  PT (By licensed PT),OT (By licensed OT)     PT Frequency: PT at SNF to eval and treat a min of 5x/week OT Frequency: OT at SNF to eval and treat a min of 5x/week            Contractures Contractures Info: Not present    Additional Factors Info  Code Status,Allergies Code Status Info: Full Allergies Info: cardura, tape           Current Medications (02/11/2020):  This is the current hospital active medication list Current Facility-Administered Medications  Medication Dose Route Frequency Provider Last Rate Last Admin  . aspirin chewable tablet 81 mg  81 mg Oral Daily Marcelyn Bruins, MD   81 mg at 02/11/20 1134  . calcitRIOL (ROCALTROL) capsule 1 mcg  1 mcg Oral Q M,W,F-HD Marcelyn Bruins, MD   1 mcg at 02/09/20 1709  . calcium acetate (PHOSLO) capsule 2,001 mg  2,001 mg Oral TID WC Marcelyn Bruins, MD   2,001 mg at 02/11/20 1134  . cefTRIAXone (ROCEPHIN) 2 g in sodium chloride 0.9 % 100 mL IVPB  2 g Intravenous Q24H Marcelyn Bruins, MD 200 mL/hr at 02/10/20 2028 2 g at 02/10/20 2028  . Chlorhexidine Gluconate Cloth 2 % PADS 6 each  6 each Topical Q0600 Gean Quint, MD   6 each at 02/11/20 [id number]  . cinacalcet (SENSIPAR) tablet 90 mg  90 mg Oral Q M,W,F Marcelyn Bruins, MD   90 mg at 02/09/20 1709  . dextrose 10 % infusion   Intravenous Continuous Regalado, Belkys A, MD 30 mL/hr at 02/09/20 1223 Infusion Verify at 02/09/20 1223  . feeding supplement (NEPRO CARB STEADY) liquid 237 mL  237 mL Oral BID BM Ernest Haber, PA-C   237 mL at 02/10/20 1500  . ferrous  sulfate tablet 325 mg  325 mg Oral Q breakfast Marcelyn Bruins, MD   325 mg at 02/11/20 1134  . lactulose (CHRONULAC) 10 GM/15ML solution 10 g  10 g Oral TID Marcelyn Bruins, MD   10 g at 02/11/20 1134  . metoprolol tartrate (LOPRESSOR) tablet 12.5 mg  12.5 mg Oral BID Marcelyn Bruins, MD   12.5 mg at 02/11/20 1139  . sodium chloride flush (NS) 0.9 % injection 3 mL  3 mL Intravenous Q12H Marcelyn Bruins, MD   3 mL at 02/10/20 2029  . vancomycin (VANCOCIN) IVPB 750 mg/150 ml premix  750 mg Intravenous Q M,W,F-HD Dinah Beers, RPH-CPP   Stopped at 02/09/20 2025     Discharge Medications: Please see discharge summary for a list of discharge medications.  Relevant Imaging Results:  Relevant Lab Results:   Additional Information SSN [id number]; Hemodialysis MWF at [facility] 10:36 am  Bary Castilla, LCSW

## 2020-02-11 NOTE — Progress Notes (Signed)
Pt transferred to 3e16. Pt lethargic and unable to answer orientation questions on arrival. Per previous RN pt had been lethargic. VSS, sating 95% on 5L O2.. Pt responds to voice, but falls asleep during attempted conversation. Pt was unable to safely swallow nightly medications due to lethargy, therefore metop and lactulose held. Night MD notified,.

## 2020-02-11 NOTE — Progress Notes (Signed)
Pt family called. Updated family. Pt currently resting.

## 2020-02-11 NOTE — Progress Notes (Signed)
Lab attempted to get am labs x4 with no success. Attending MD would like to avoid foot draws due to wounds. Orders for midline placed

## 2020-02-11 NOTE — Progress Notes (Signed)
[facility] Progress Note   Subjective:   Patient seen and examined at bedside. Lethargic.  Opens eyes briefly and answers some questions.  Able to tell me he's in a hospital and it is December but garbled speech followed his answers. Denies current SOB, n/v/d, chest pain, and abdominal pain.   Objective Vitals:   02/10/20 2100 02/11/20 0000 02/11/20 0400 02/11/20 0758  BP: (!) 127/93 119/81 108/77 116/72  Pulse: 90 78 (!) 34 73  Resp: 18 18 (!) 23 19  Temp:  98.5 F (36.9 C) 98.5 F (36.9 C) 97.7 F (36.5 C)  TempSrc:  Oral Axillary Oral  SpO2:  97% 97% 100%  Weight:  78.2 kg 78.2 kg   Height:       Physical Exam General:chronically ill appearing male in NAD, lethargic, not at baseline mental status Heart:RRR Lungs:mostly CTAB, BS decreased on R, nml WOB on 4L via [place] Abdomen:soft, NTND Extremities:no LE edema Dialysis Access: LU AVF +b/t   Filed Weights   02/10/20 0437 02/11/20 0000 02/11/20 0400  Weight: 78.6 kg 78.2 kg 78.2 kg    Intake/Output Summary (Last 24 hours) at 02/11/2020 0919 Last data filed at 02/10/2020 2028 Gross per 24 hour  Intake 598.8 ml  Output --  Net 598.8 ml    Additional Objective Labs: Basic Metabolic Panel: Recent Labs  Lab 02/08/20 0102 02/09/20 0234 02/10/20 0453  NA 133* 136 134*  K 3.7 3.8 3.9  CL 93* 94* 94*  CO2 24 26 26   GLUCOSE 135* 113* 132*  BUN 28* 21 20  CREATININE 5.95* 5.02* 4.52*  CALCIUM 8.1* 7.9* 8.0*   Liver Function Tests: Recent Labs  Lab 02/08/20 0102 02/09/20 0234 02/10/20 0453  AST 672* 381* 239*  ALT 909* 685* 510*  ALKPHOS 129* 134* 128*  BILITOT 1.1 1.3* 1.3*  PROT 5.1* 5.4* 5.4*  ALBUMIN 2.4* 2.5* 2.5*   No results for input(s): LIPASE, AMYLASE in the last 168 hours. CBC: Recent Labs  Lab 02/06/2020 1312 02/06/20 0800 02/06/20 1201 02/07/20 0507 02/08/20 0102 02/09/20 0234 02/10/20 0453  WBC 15.8*   < > 16.2* 16.5* 14.5* 13.6* 12.8*  NEUTROABS 14.2*  --   --   --    --   --   --   HGB 13.0   < > 11.8* 12.1* 12.7* 12.5* 12.3*  HCT 44.1   < > 38.6* 37.8* 40.5 40.4 37.8*  MCV 87.8   < > 84.3 83.6 82.3 82.8 81.1  PLT 225   < > 164 134* 112* 84* 90*   < > = values in this interval not displayed.   Blood Culture    Component Value Date/Time   SDES BLOOD SITE NOT SPECIFIED 02/17/2020 1315   SPECREQUEST AEROBIC BOTTLE ONLY Blood Culture adequate volume 03/01/2020 1315   CULT  02/01/2020 1315    NO GROWTH 5 DAYS Performed at [facility], [place] [address] [place], [place] [address]    REPTSTATUS 02/10/2020 FINAL 02/06/2020 1315    Cardiac Enzymes: Recent Labs  Lab 02/06/20 1042  CKTOTAL 182   CBG: Recent Labs  Lab 02/09/20 1657 02/09/20 2110 02/10/20 0603 02/10/20 1119 02/10/20 1630  GLUCAP 91 107* 123* 121* 119*   Iron Studies: No results for input(s): IRON, TIBC, TRANSFERRIN, FERRITIN in the last 72 hours. Lab Results  Component Value Date   INR 1.5 (H) 02/10/2020   INR 1.6 (H) 02/09/2020   INR 1.9 (H) 02/08/2020   Studies/Results: DG CHEST PORT 1  VIEW  Result Date: 02/10/2020 CLINICAL DATA:  Hypoxia. EXAM: PORTABLE CHEST 1 VIEW COMPARISON:  February 05, 2020. FINDINGS: Stable cardiomegaly. No pneumothorax or pleural effusion is noted. Lungs are clear. Right-sided pacemaker is unchanged in position. Bony thorax is unremarkable. IMPRESSION: No active disease. Electronically Signed   By: Marijo Conception M.D.   On: 02/10/2020 10:45    Medications: . cefTRIAXone (ROCEPHIN)  IV 2 g (02/10/20 2028)  . dextrose 30 mL/hr at 02/09/20 1223  . vancomycin Stopped (02/09/20 0955)   . aspirin  81 mg Oral Daily  . calcitRIOL  1 mcg Oral Q M,W,F-HD  . calcium acetate  2,001 mg Oral TID WC  . Chlorhexidine Gluconate Cloth  6 each Topical Q0600  . cinacalcet  90 mg Oral Q M,W,F  . feeding supplement (NEPRO CARB STEADY)  237 mL Oral BID BM  . ferrous sulfate  325 mg Oral Q breakfast  . lactulose  10 g Oral TID  . metoprolol tartrate  12.5  mg Oral BID  . sodium chloride flush  3 mL Intravenous Q12H    Dialysis Orders: Emilie Rutter  MWF, 4 hours, 78 kg EDW, 2K, 2 calcium bath, UF profile 2, left forearm AV fistula access, no heparin, no ESA, calcitriol 0.75 mcg p.o. daily  Assessment/Plan: 1.  ESRD -HD on MWF. HD tomorrow per regular schedule. K 3.9.  No Heparin. 2. Encephalopathy -elevated ammoniaandelevated LFTs improving.So far lab has been unable to draw blood for lab work today.  work-up per admit/GI.known history of pancreatitis.hypoglycemia.CT head no acute findings. BC NGTD. On lactulose. Seen by palliative on 12/10 - made him DNR and noted" if patient declines will consider hospice " continue HD until further discussion with family 3. Hypoxemia : improving, now on 4L via [place].  Denies SOB.  CXR 12/11 with no acute disease.  4. Hypertension/volume -intermittent hypotension, BP now in goal. On low dose lopressor. Repeat CXR 12/11 with no active disease initial CXR withsome vascular congestion.  Does not appear volume overloaded.  UF as tolerated.  5. Anemia -Hgbstable 12.3no ESAindicated 6. Metabolic bone disease -CCa in goal. Will check phos. Continue binders, sensipar and VDRA.  7. Nutrition -Albumin2.5noted pured diet per speech/protein supplement 8. Right lower extremity foot wounds per Dr. Luther Parody team noted patient/daughterpreviouslyrefused angiogramunlesswounds worsen,placed on IV antibiotics wound care. Possible angiogram when stable.  9. Elevated LFTs with coagulopathy encephalopathy-GI following suspect ischemic hepatitis.  Labs improving. On oral lactulose TID. Givenvitamin K for coagulopathy, will hold heparin on hemodialysis. 10. Diabetes mellitus type 2 hypoglycemic on admission -plan per admit team,  Continues to require D10 dripat54ml/hr   Jen Mow, PA-C [place] 02/11/2020,9:19 AM  LOS: 6 days

## 2020-02-11 NOTE — Progress Notes (Signed)
Pharmacy Antibiotic Note  Sean Best is a 86 y.o. male admitted on 01/31/2020 with foot wound infection.  Patient has had foot wound for last 3-4 weeks and completed 2 courses of outpatient Doxycycline with little improvement.  Pharmacy has been consulted for Vancomycin dosing.  Patient is also on Rocephin per MD orders. Potential angiogram when able and deescalation as appropriate. Patient is afebrile, WBC 12.8.  The patient is ESRD-MWF and is now back on schedule.   Plan: - Vancomycin 750 mg IV with HD MWF  - CTX 2g IV every 24h - Will continue to follow HD schedule/duration, culture results, LOT, and antibiotic de-escalation plans   Height: 5\' 5"  (165.1 cm) Weight: 78.2 kg (172 lb 6.4 oz) IBW/kg (Calculated) : 61.5  Temp (24hrs), Avg:98.1 F (36.7 C), Min:97.7 F (36.5 C), Max:98.5 F (36.9 C)  Recent Labs  Lab 02/06/20 0800 02/06/20 1201 02/07/20 0507 02/08/20 0102 02/09/20 0234 02/10/20 0453  WBC 16.1* 16.2* 16.5* 14.5* 13.6* 12.8*  CREATININE 8.57*  --  5.48* 5.95* 5.02* 4.52*    Estimated Creatinine Clearance: 11.7 mL/min (A) (by C-G formula based on SCr of 4.52 mg/dL (H)).    Allergies  Allergen Reactions  . Cardura [Doxazosin Mesylate] Other (See Comments)    Hallucinations  . Tape Dermatitis    Paper tape ok to use     Antimicrobials this admission: Vanc 12/7 >> Rocephin 12/6 >>  Microbiology results: 12/6 BCx >>ngtd  Thank you for allowing pharmacy to be a part of this patient's care.  Romilda Garret, PharmD PGY1 Acute Care Pharmacy Resident Phone: [phone] 02/11/2020 11:51 AM  Please check [email or web address] for unit specific pharmacy phone numbers.

## 2020-02-11 NOTE — Progress Notes (Signed)
PROGRESS NOTE    Sean Best  SFK:[id number] DOB: 05-08-1933 DOA: 02/27/2020 PCP: Seward Carol, MD   Brief Narrative:  86 year old with past medical history significant for GI bleed, status post cholecystectomy 2019, history of pancreatitis, anemia, aortic stenosis, heart block status post pacemaker, diabetes mellitus, ESRD on hemodialysis MWF, gout, history of CVA, hyperlipidemia, hypertension, prostate cancer, CAD who presented to the hospital with altered mental status. Patient had a week of increasing weakness and fatigue with confusion. Patient also had decreased oral intake and was constipated. He also had a right foot wound that he has been caring at home for the last 3 weeks. He follows with Dr. Donnetta Hutching. In the ED, patient systolic blood pressure was low in the 80s, transaminases AST 578 ALT 439, leukocytosis, INR 2.1. Ammonia 177. Chest x-ray show some congestion. CT abdomen showed bilateral 2 mm obstructing renal stone, numerous bilateral partially calcified and noncalcified renal cyst, diffuse urinary bladder wall thickening, multiple prostate radiation implantation seeds. Right upper Quadrant ultrasound showed status post cholecystectomy, mild prominence of the common bile duct which may be due to post surgical dilation, not definite if shadowing stone, small amount of perihepatic ascites. Patient admitted with hepatic encephalopathy, hepatitis, concern for infection unclear source   Assessment & Plan:   Principal Problem:   Acute encephalopathy Active Problems:   Anemia   HTN (hypertension)   Hyperlipidemia   Heart block   Cardiac pacemaker in situ ([employer] DDD)   Leukocytosis   End-stage renal disease on hemodialysis ([place])   HLD (hyperlipidemia)   Diabetes mellitus type 2, uncontrolled, with complications (HCC)   Anemia in chronic kidney disease   Hypothyroidism, unspecified   Hepatitis   Palliative care encounter   Acute hypoxic resp failure:  Patient with  hypoxia sat AM, rapidly recovers with supplemental O2 X-ray without acute volume overload or pneumonia identified Transferred patient to progressive,  Discussed the case with nephrology who reviewed patient's for possible dialysis did not feel he needed emergent volume removal, 2 L removed yesterday Pt now much more awake and alert on 4L  Acute hepatic encephalopathy: Elevated ammonia on presentation. Continue oral lactulose. CT head scan was without acute findings. continue to monitor closely. Improving. Normally he doesn't get confused and was fully independent to ADLs as per the daughter. He does have mild memory issues at baseline. Awaiting albs this AM  Dysphagia. On dysphagia 1 diet. On D10 as well. Advance diet as tolerated and discontinue D10 when able.Advance to soft diet today.  Acute hepatitis, elevated LFTs, status post cholecystectomy 2019 Continue to monitor LFTs. Hold statins. CT did not show any focal abnormality. Viral hepatitis panel was negative. GI on board.There was also concern for shock liver related to hypotension and underlying infection. Likely ischemic hepatitisas per GI. Normal AMA,alpha-fetoprotein .Received oral vitamin K.  LFTs continue trending down-LABS PENDING FOR SUNDAY  Diabetes mellitus type 2. presented with hypoglycemia. Receiving D10at35 mL/h discontinue D10 when oral intake adequate.  Leukocytosis, hypotension: Concern for infection. Blood cultures negative so far. On IVRocephinand vancomycin. Concern for possible foot infection. Patient was getting IV antibiotics with dialysis for foot infectionprior to coming to the hospital. Chest x-ray showed cardiomegaly with vascular congestion will repeat now stable. T-max of 98.5 F.Consider de-escalating antibiotic after tomorrow.  ESRD on hemodialysis MWF: Nephrology on board. Continue hemodialysisas per schedule..Continue with calcitriol, cinacalcet,  PhosLo.  Essential hypertension, aortic stenosis, heart block status post pacemaker: Continue with metoprolol,monitor closely. Latest blood pressure of 118/68.  Hyperlipidemia, history of  CVA: Continue with aspirin. Hold statins for now.  Right foot with wound, PVD Continue wound care. Vascular surgery on board and does not believe foot as a source of infection. Vascular surgeryhas plans for angiogram at some point.  DVT prophylaxis: SCD/Compression stockings  Code Status: DNR    Code Status Orders  (From admission, onward)         Start     Ordered   02/09/20 1652  Do not attempt resuscitation (DNR)  Continuous       Question Answer Comment  In the event of cardiac or respiratory ARREST Do not call a "code blue"   In the event of cardiac or respiratory ARREST Do not perform Intubation, CPR, defibrillation or ACLS   In the event of cardiac or respiratory ARREST Use medication by any route, position, wound care, and other measures to relive pain and suffering. May use oxygen, suction and manual treatment of airway obstruction as needed for comfort.      02/09/20 1651        Code Status History    Date Active Date Inactive Code Status Order ID Comments User Context   02/17/2020 2111 02/09/2020 1651 Full Code [id number]  Marcelyn Bruins, MD ED   09/20/2019 1758 09/26/2019 2007 Full Code [id number]  Allie Bossier, MD ED   10/16/2018 0040 10/18/2018 1758 Full Code [id number]  Bonnell Public, MD Inpatient   04/27/2018 1838 04/30/2018 1850 Full Code [id number]  Merton Border, MD ED   02/10/2018 1242 02/13/2018 1747 Full Code [id number]  Reubin Milan, MD ED   02/10/2018 1242 02/10/2018 1242 Full Code [id number]  Reubin Milan, MD ED   12/06/2017 2016 12/08/2017 2255 Full Code [id number]  Shela Leff, MD ED   12/02/2017 1254 12/03/2017 2116 Full Code [id number]  Michael Boston, MD Inpatient   04/17/2017 2209 04/23/2017 2011 Full Code [id number]  Cristy Folks, MD  Inpatient   04/10/2016 2201 04/12/2016 1654 Full Code [id number]  Benito Mccreedy, MD Inpatient   12/28/2014 2023 12/30/2014 1438 Full Code [id number]  Nita Sells, MD Inpatient   12/10/2013 0212 12/14/2013 1716 Full Code [id number]  Modena Jansky, MD ED   03/11/2013 1921 03/18/2013 2059 Full Code [id number]  Delfina Redwood, MD Inpatient   Advance Care Planning Activity     Family Communication: Talked with daughter on sunday Disposition Plan:   Patient remained inpatient for continued respiratory support, electrolyte replacement, IV antibiotics, he is not yet medically stable for discharge. Consults called: None Admission status: Inpatient   Consultants:   GI  Procedures:  CT ABDOMEN PELVIS WO CONTRAST  Result Date: 02/10/2020 CLINICAL DATA:  Abdominal distension with altered mental status and constipation. EXAM: CT ABDOMEN AND PELVIS WITHOUT CONTRAST TECHNIQUE: Multidetector CT imaging of the abdomen and pelvis was performed following the standard protocol without IV contrast. COMPARISON:  September 20, 2019 FINDINGS: Lower chest: No acute abnormality. Hepatobiliary: No focal liver abnormality is seen. Status post cholecystectomy. No biliary dilatation. Pancreas: Unremarkable. No pancreatic ductal dilatation or surrounding inflammatory changes. Spleen: Normal in size without focal abnormality. Adrenals/Urinary Tract: Adrenal glands are unremarkable. Kidneys are normal in size, without obstructing renal calculi or hydronephrosis. Numerous bilateral partially calcified and noncalcified cysts are seen within both kidneys kidneys. Bilateral 2 mm nonobstructing renal stones are seen. The urinary bladder is contracted and subsequently limited in evaluation. Diffuse thick uretero bladder wall thickening is suspected. This is seen on the prior study. Stomach/Bowel: Stomach is within normal limits. Appendix appears  normal. No evidence of bowel wall thickening, distention, or inflammatory  changes. Vascular/Lymphatic: Aortic atherosclerosis with stable 3.1 cm infrarenal aneurysmal dilatation. No enlarged abdominal or pelvic lymph nodes. Reproductive: Multiple prostate radiation implantation seeds are seen. Other: No abdominal wall hernia or abnormality. No abdominopelvic ascites. Musculoskeletal: Marked severity chronic and multilevel degenerative changes are seen throughout the lumbar spine. IMPRESSION: 1. Bilateral 2 mm nonobstructing renal stones. 2. Numerous bilateral partially calcified and noncalcified renal cysts. 3. Diffuse urinary bladder wall thickening which is seen on the prior study and may represent sequelae associated with cystitis. 4. Multiple prostate radiation implantation seeds. 5. Aortic atherosclerosis. Aortic Atherosclerosis (ICD10-I70.0). Electronically Signed   By: Virgina Norfolk M.D.   On: 02/01/2020 21:09   DG Abdomen 1 View  Result Date: 02/15/2020 CLINICAL DATA:  Weakness, no bowel movement for 1 week EXAM: ABDOMEN - 1 VIEW COMPARISON:  September 20, 2019 FINDINGS: Brachytherapy seeds in the prostate. Surgical clips in the RIGHT upper quadrant compatible with prior cholecystectomy. Upper abdomen excluded from view.  Stomach with mild distension. Gas and stool in the rectum. Question of mild haustral thickening of the distal transverse colon. No signs of small bowel obstruction. Faint calcification overlies the LEFT abdomen more lateral than expected for ureteral calculus though ureteral calculus not excluded measuring 4 mm. Evidence of calcified atheromatous plaque in the abdominal aorta extending in the iliac vessels. On limited assessment no acute skeletal process. IMPRESSION: 1. Question of some haustral thickening of the distal transverse colon, findings could be seen in the setting of colitis. No sign of bowel obstruction. 2. Potential mid LEFT ureteral calculus. 3. Upper abdomen excluded from view 4. Signs of aortoiliac atherosclerosis. Electronically Signed   By:  Zetta Bills M.D.   On: 02/28/2020 13:06   CT HEAD WO CONTRAST  Result Date: 02/06/2020 CLINICAL DATA:  Decreased level of consciousness EXAM: CT HEAD WITHOUT CONTRAST TECHNIQUE: Contiguous axial images were obtained from the base of the skull through the vertex without intravenous contrast. COMPARISON:  10/17/2018 FINDINGS: Brain: There is atrophy and chronic small vessel disease changes. No acute intracranial abnormality. Specifically, no hemorrhage, hydrocephalus, mass lesion, acute infarction, or significant intracranial injury. Vascular: No hyperdense vessel or unexpected calcification. Skull: No acute calvarial abnormality. Sinuses/Orbits: No acute findings Other: None IMPRESSION: Atrophy, chronic microvascular disease. No acute intracranial abnormality. Electronically Signed   By: Rolm Baptise M.D.   On: 02/14/2020 14:05   US Abdomen Limited  Result Date: 02/12/2020 CLINICAL DATA:  Elevated LFTs EXAM: ULTRASOUND ABDOMEN LIMITED RIGHT UPPER QUADRANT COMPARISON:  None. FINDINGS: Gallbladder: The patient is status post cholecystectomy. No biliary ductal dilation. Common bile duct: Diameter: 1 cm Liver: No focal lesion identified. Within normal limits in parenchymal echogenicity. Portal vein is patent on color Doppler imaging with normal direction of blood flow towards the liver. Other: Small amount of perihepatic ascites is seen. IMPRESSION: Status post cholecystectomy. Mild prominence of the common bile duct which may be due to postsurgical dilation, no definite shadowing stones are seen. Small amount of perihepatic ascites. Electronically Signed   By: Prudencio Pair M.D.   On: 02/28/2020 19:53   DG CHEST PORT 1 VIEW  Result Date: 02/10/2020 CLINICAL DATA:  Hypoxia. EXAM: PORTABLE CHEST 1 VIEW COMPARISON:  February 05, 2020. FINDINGS: Stable cardiomegaly. No pneumothorax or pleural effusion is noted. Lungs are clear. Right-sided pacemaker is unchanged in position. Bony thorax is unremarkable.  IMPRESSION: No active disease. Electronically Signed   By: Bobbe Medico.D.  On: 02/10/2020 10:45   DG Chest Portable 1 View  Result Date: 02/08/2020 CLINICAL DATA:  Weakness for 1 week, no bowel movements for 1 week EXAM: PORTABLE CHEST 1 VIEW COMPARISON:  March 29, 2019 FINDINGS: Trachea midline. Heart size remains enlarged. RIGHT-sided pacer device, 2 lead with power pack over the RIGHT chest with similar appearance. Engorgement of central pulmonary vasculature. No lobar consolidation or pleural effusion. On limited assessment no acute skeletal process. IMPRESSION: Cardiomegaly with vascular congestion. Electronically Signed   By: Zetta Bills M.D.   On: 02/15/2020 13:03   DG Foot Complete Right  Result Date: 01/24/2020 CLINICAL DATA:  Right foot swelling EXAM: RIGHT FOOT COMPLETE - 3+ VIEW COMPARISON:  None. FINDINGS: There is no evidence of fracture or dislocation. There is no evidence of arthropathy or other focal bone abnormality. Soft tissues are unremarkable. IMPRESSION: Negative. Electronically Signed   By: Ulyses Jarred M.D.   On: 01/24/2020 00:19     Antimicrobials:   See MAR   Subjective: Patient transition to progressive care unit, Has improved considerably from yesterday More awake and alert and answering questions today  Objective: Vitals:   02/10/20 2100 02/11/20 0000 02/11/20 0400 02/11/20 0758  BP: (!) 127/93 119/81 108/77 116/72  Pulse: 90 78 (!) 34 73  Resp: 18 18 (!) 23 19  Temp:  98.5 F (36.9 C) 98.5 F (36.9 C) 97.7 F (36.5 C)  TempSrc:  Oral Axillary Oral  SpO2:  97% 97% 100%  Weight:  78.2 kg 78.2 kg   Height:        Intake/Output Summary (Last 24 hours) at 02/11/2020 1058 Last data filed at 02/11/2020 0900 Gross per 24 hour  Intake 598.8 ml  Output --  Net 598.8 ml   Filed Weights   02/10/20 0437 02/11/20 0000 02/11/20 0400  Weight: 78.6 kg 78.2 kg 78.2 kg    Examination:  General exam: Appears calm and comfortable   Respiratory system: Clear to auscultation. Respiratory effort normal. Cardiovascular system: S1 & S2 heard, RRR. No JVD, murmurs, rubs, gallops or clicks. No pedal edema. Gastrointestinal system: Abdomen is nondistended, soft and nontender. No organomegaly or masses felt. Normal bowel sounds heard. Central nervous system: Alert No focal neurological deficits, weak Extremities: Warm well perfused, mildly edematous. Skin: Right foot greater than left with erythema and some blistering, no drainage Psychiatry: No acute decompensation, flat affect.     Data Reviewed: I have personally reviewed following labs and imaging studies  CBC: Recent Labs  Lab 02/20/2020 1312 02/06/20 0800 02/06/20 1201 02/07/20 0507 02/08/20 0102 02/09/20 0234 02/10/20 0453  WBC 15.8*   < > 16.2* 16.5* 14.5* 13.6* 12.8*  NEUTROABS 14.2*  --   --   --   --   --   --   HGB 13.0   < > 11.8* 12.1* 12.7* 12.5* 12.3*  HCT 44.1   < > 38.6* 37.8* 40.5 40.4 37.8*  MCV 87.8   < > 84.3 83.6 82.3 82.8 81.1  PLT 225   < > 164 134* 112* 84* 90*   < > = values in this interval not displayed.   Basic Metabolic Panel: Recent Labs  Lab 02/06/20 0800 02/07/20 0507 02/08/20 0102 02/09/20 0234 02/10/20 0453  NA 137 137 133* 136 134*  K 4.7 3.8 3.7 3.8 3.9  CL 89* 94* 93* 94* 94*  CO2 21* 23 24 26 26   GLUCOSE 111* 88 135* 113* 132*  BUN 38* 23 28* 21 20  CREATININE 8.57*  5.48* 5.95* 5.02* 4.52*  CALCIUM 8.8* 8.3* 8.1* 7.9* 8.0*   GFR: Estimated Creatinine Clearance: 11.7 mL/min (A) (by C-G formula based on SCr of 4.52 mg/dL (H)). Liver Function Tests: Recent Labs  Lab 02/06/20 0800 02/07/20 0507 02/08/20 0102 02/09/20 0234 02/10/20 0453  AST 1,382* 1,490* 672* 381* 239*  ALT 860* 1,219* 909* 685* 510*  ALKPHOS 132* 136* 129* 134* 128*  BILITOT 1.6* 1.6* 1.1 1.3* 1.3*  PROT 5.9* 5.3* 5.1* 5.4* 5.4*  ALBUMIN 2.9* 2.6* 2.4* 2.5* 2.5*   No results for input(s): LIPASE, AMYLASE in the last 168 hours. Recent  Labs  Lab 02/06/2020 1748 02/06/20 0800 02/07/20 0903  AMMONIA 77* 49* 33   Coagulation Profile: Recent Labs  Lab 02/06/20 0800 02/07/20 0507 02/08/20 0102 02/09/20 0234 02/10/20 0453  INR 2.6* 2.4* 1.9* 1.6* 1.5*   Cardiac Enzymes: Recent Labs  Lab 02/06/20 1042  CKTOTAL 182   BNP (last 3 results) No results for input(s): PROBNP in the last 8760 hours. HbA1C: No results for input(s): HGBA1C in the last 72 hours. CBG: Recent Labs  Lab 02/09/20 1657 02/09/20 2110 02/10/20 0603 02/10/20 1119 02/10/20 1630  GLUCAP 91 107* 123* 121* 119*   Lipid Profile: No results for input(s): CHOL, HDL, LDLCALC, TRIG, CHOLHDL, LDLDIRECT in the last 72 hours. Thyroid Function Tests: No results for input(s): TSH, T4TOTAL, FREET4, T3FREE, THYROIDAB in the last 72 hours. Anemia Panel: No results for input(s): VITAMINB12, FOLATE, FERRITIN, TIBC, IRON, RETICCTPCT in the last 72 hours. Sepsis Labs: Recent Labs  Lab 02/06/20 1757 02/07/20 0507 02/08/20 0102  PROCALCITON 1.46 1.96 2.30    Recent Results (from the past 240 hour(s))  Blood culture (routine x 2)     Status: None   Collection Time: 02/27/2020  1:13 PM   Specimen: BLOOD  Result Value Ref Range Status   Specimen Description BLOOD SITE NOT SPECIFIED  Final   Special Requests AEROBIC BOTTLE ONLY Blood Culture adequate volume  Final   Culture   Final    NO GROWTH 5 DAYS Performed at [facility], [address] [place], [place] [address]    Report Status 02/10/2020 FINAL  Final  Blood culture (routine x 2)     Status: None   Collection Time: 02/12/2020  1:15 PM   Specimen: BLOOD  Result Value Ref Range Status   Specimen Description BLOOD SITE NOT SPECIFIED  Final   Special Requests AEROBIC BOTTLE ONLY Blood Culture adequate volume  Final   Culture   Final    NO GROWTH 5 DAYS Performed at [facility], [place] [address], [place], [place] [address]    Report Status 02/10/2020 FINAL  Final  Resp Panel by  RT-PCR (Flu A&B, Covid) Nasopharyngeal Swab     Status: None   Collection Time: 02/15/2020  1:54 PM   Specimen: Nasopharyngeal Swab; Nasopharyngeal(NP) swabs in vial transport medium  Result Value Ref Range Status   SARS Coronavirus 2 by RT PCR NEGATIVE NEGATIVE Final    Comment: (NOTE) SARS-CoV-2 target nucleic acids are NOT DETECTED.  The SARS-CoV-2 RNA is generally detectable in upper respiratory specimens during the acute phase of infection. The lowest concentration of SARS-CoV-2 viral copies this assay can detect is 138 copies/mL. A negative result does not preclude SARS-Cov-2 infection and should not be used as the sole basis for treatment or other patient management decisions. A negative result may occur with  improper specimen collection/handling, submission of specimen other than nasopharyngeal swab, presence of  viral mutation(s) within the areas targeted by this assay, and inadequate number of viral copies(<138 copies/mL). A negative result must be combined with clinical observations, patient history, and epidemiological information. The expected result is Negative.  Fact Sheet for Patients:  EntrepreneurPulse.com.au  Fact Sheet for Healthcare Providers:  IncredibleEmployment.be  This test is no t yet approved or cleared by the [place] FDA and  has been authorized for detection and/or diagnosis of SARS-CoV-2 by FDA under an Emergency Use Authorization (EUA). This EUA will remain  in effect (meaning this test can be used) for the duration of the COVID-19 declaration under Section 564(b)(1) of the Act, 21 U.S.C.section 360bbb-3(b)(1), unless the authorization is terminated  or revoked sooner.       Influenza A by PCR NEGATIVE NEGATIVE Final   Influenza B by PCR NEGATIVE NEGATIVE Final    Comment: (NOTE) The Xpert Xpress SARS-CoV-2/FLU/RSV plus assay is intended as an aid in the diagnosis of influenza from Nasopharyngeal swab  specimens and should not be used as a sole basis for treatment. Nasal washings and aspirates are unacceptable for Xpert Xpress SARS-CoV-2/FLU/RSV testing.  Fact Sheet for Patients: EntrepreneurPulse.com.au  Fact Sheet for Healthcare Providers: IncredibleEmployment.be  This test is not yet approved or cleared by the [place] FDA and has been authorized for detection and/or diagnosis of SARS-CoV-2 by FDA under an Emergency Use Authorization (EUA). This EUA will remain in effect (meaning this test can be used) for the duration of the COVID-19 declaration under Section 564(b)(1) of the Act, 21 U.S.C. section 360bbb-3(b)(1), unless the authorization is terminated or revoked.  Performed at [facility], [place] [address], [place], [place] [address]          Radiology Studies: DG CHEST PORT 1 VIEW  Result Date: 02/10/2020 CLINICAL DATA:  Hypoxia. EXAM: PORTABLE CHEST 1 VIEW COMPARISON:  February 05, 2020. FINDINGS: Stable cardiomegaly. No pneumothorax or pleural effusion is noted. Lungs are clear. Right-sided pacemaker is unchanged in position. Bony thorax is unremarkable. IMPRESSION: No active disease. Electronically Signed   By: Marijo Conception M.D.   On: 02/10/2020 10:45        Scheduled Meds: . aspirin  81 mg Oral Daily  . calcitRIOL  1 mcg Oral Q M,W,F-HD  . calcium acetate  2,001 mg Oral TID WC  . Chlorhexidine Gluconate Cloth  6 each Topical Q0600  . cinacalcet  90 mg Oral Q M,W,F  . feeding supplement (NEPRO CARB STEADY)  237 mL Oral BID BM  . ferrous sulfate  325 mg Oral Q breakfast  . lactulose  10 g Oral TID  . metoprolol tartrate  12.5 mg Oral BID  . sodium chloride flush  3 mL Intravenous Q12H   Continuous Infusions: . cefTRIAXone (ROCEPHIN)  IV 2 g (02/10/20 2028)  . dextrose 30 mL/hr at 02/09/20 1223  . vancomycin Stopped (02/09/20 0955)     LOS: 6 days    Time spent: 35 min    Nicolette Bang,  MD [facility]  If 7PM-7AM, please contact night-coverage  02/11/2020, 10:58 AM

## 2020-02-11 NOTE — TOC Progression Note (Signed)
Transition of Care [facility]) - Progression Note    Patient Details  Name: Sean Best MRN: [id number] Date of Birth: 01-22-1934  Transition of Care [facility]) CM/SW [place], [place] Phone Number: [phone] 02/11/2020, 11:57 AM  Clinical Narrative:     CSW spoke with patient's daughter Sean Best about the PT recommendations. Sean Best stated that she would like for patient to go to a SNF to assist with him getting better. CSW received permission to fax out to local facilities. CSW informed Sean Best that she could review the Medicare.gov ratings. Sean Best informed CSW that her first preference would be [facility] and second Location manager. Sean Best informed CSW that paitent has not been to a facility. CSW reviewed the SNF process.  TOC team will continue to assist with discharge planning needs.     Expected Discharge Plan: [place] Barriers to Discharge: Continued Medical Work up  Expected Discharge Plan and Services Expected Discharge Plan: [place]   Discharge Planning Services: CM Consult   Living arrangements for the past 2 months: Single Family Home                                       Social Determinants of Health (SDOH) Interventions    Readmission Risk Interventions Readmission Risk Prevention Plan 09/25/2019  Transportation Screening Complete  Medication Review (RN Care Manager) Referral to Pharmacy  PCP or Specialist appointment within 3-5 days of discharge Complete  HRI or [place] Complete  SW Recovery Care/Counseling Consult Complete  [place] Not Applicable  Some recent data might be hidden

## 2020-02-11 NOTE — Progress Notes (Signed)
VAST consulted to place midline for labdraws.  VAST RN spoke with nephrology and attending regarding midlines and their unreliability for consistent lab draws. Since this patient has a CrCl <12, physician decision made to not place midline and to collect labs 12/13 am prior to dialysis.  Information shared with pt's nurse, Dianna.

## 2020-02-12 DIAGNOSIS — G9340 Encephalopathy, unspecified: Secondary | ICD-10-CM

## 2020-02-12 LAB — CBC
HCT: 37.7 % — ABNORMAL LOW (ref 39.0–52.0)
Hemoglobin: 12.4 g/dL — ABNORMAL LOW (ref 13.0–17.0)
MCH: 26.4 pg (ref 26.0–34.0)
MCHC: 32.9 g/dL (ref 30.0–36.0)
MCV: 80.4 fL (ref 80.0–100.0)
Platelets: 97 10*3/uL — ABNORMAL LOW (ref 150–400)
RBC: 4.69 MIL/uL (ref 4.22–5.81)
RDW: 22 % — ABNORMAL HIGH (ref 11.5–15.5)
WBC: 26.3 10*3/uL — ABNORMAL HIGH (ref 4.0–10.5)
nRBC: 0.2 % (ref 0.0–0.2)

## 2020-02-12 LAB — COMPREHENSIVE METABOLIC PANEL
ALT: 231 U/L — ABNORMAL HIGH (ref 0–44)
AST: 99 U/L — ABNORMAL HIGH (ref 15–41)
Albumin: 2.1 g/dL — ABNORMAL LOW (ref 3.5–5.0)
Alkaline Phosphatase: 124 U/L (ref 38–126)
Anion gap: 16 — ABNORMAL HIGH (ref 5–15)
BUN: 49 mg/dL — ABNORMAL HIGH (ref 8–23)
CO2: 23 mmol/L (ref 22–32)
Calcium: 8.4 mg/dL — ABNORMAL LOW (ref 8.9–10.3)
Chloride: 95 mmol/L — ABNORMAL LOW (ref 98–111)
Creatinine, Ser: 6.7 mg/dL — ABNORMAL HIGH (ref 0.61–1.24)
GFR, Estimated: 8 mL/min — ABNORMAL LOW (ref >60)
Glucose, Bld: 117 mg/dL — ABNORMAL HIGH (ref 70–99)
Potassium: 4.1 mmol/L (ref 3.5–5.1)
Sodium: 134 mmol/L — ABNORMAL LOW (ref 135–145)
Total Bilirubin: 1.2 mg/dL (ref 0.3–1.2)
Total Protein: 5 g/dL — ABNORMAL LOW (ref 6.5–8.1)

## 2020-02-12 LAB — GLUCOSE, CAPILLARY
Glucose-Capillary: 98 mg/dL (ref 70–99)
Glucose-Capillary: 113 mg/dL — ABNORMAL HIGH (ref 70–99)
Glucose-Capillary: 12 mg/dL — CL (ref 70–99)
Glucose-Capillary: 50 mg/dL — ABNORMAL LOW (ref 70–99)
Glucose-Capillary: 97 mg/dL (ref 70–99)
Glucose-Capillary: <10 mg/dL — CL (ref 70–99)
Glucose-Capillary: <10 mg/dL — CL (ref 70–99)
Glucose-Capillary: 37 mg/dL — CL (ref 70–99)
Glucose-Capillary: 94 mg/dL (ref 70–99)
Glucose-Capillary: 100 mg/dL — ABNORMAL HIGH (ref 70–99)

## 2020-02-12 LAB — PROTIME-INR
INR: 1.6 — ABNORMAL HIGH (ref 0.8–1.2)
Prothrombin Time: 18.6 seconds — ABNORMAL HIGH (ref 11.4–15.2)

## 2020-02-12 MED ORDER — DEXTROSE 50 % IV SOLN
INTRAVENOUS | Status: AC
  Administered 2020-02-12: 50 mL via INTRAVENOUS
  Filled 2020-02-12: qty 50

## 2020-02-12 MED ORDER — VANCOMYCIN HCL IN DEXTROSE 750-5 MG/150ML-% IV SOLN
INTRAVENOUS | Status: AC
  Administered 2020-02-12: 750 mg via INTRAVENOUS
  Filled 2020-02-12: qty 150

## 2020-02-12 MED ORDER — DEXTROSE 50 % IV SOLN: 1.0000 | Freq: Once | INTRAVENOUS | Status: AC

## 2020-02-12 MED ORDER — SODIUM CHLORIDE 0.9 % IV SOLN
INTRAVENOUS | Status: DC | PRN
  Administered 2020-02-12: 21:00:00 250 mL via INTRAVENOUS

## 2020-02-12 MED ORDER — DEXTROSE 50 % IV SOLN
INTRAVENOUS | Status: AC
  Filled 2020-02-12: qty 50

## 2020-02-12 NOTE — Progress Notes (Signed)
[facility]  Subjective  - Weak appearing answers questions, alert.   Objective 97/60 66 (!) 97.5 F (36.4 C) (Oral) 18 96%  Intake/Output Summary (Last 24 hours) at 02/12/2020 0724 Last data filed at 02/11/2020 1800 Gross per 24 hour  Intake 480 ml  Output --  Net 480 ml    Right foot with dry gangrene toe wounds.  Foot cool to touch, no erythema or openings in skin. Motor intact B feet Lungs non labored breathing   Assessment/Planning: Right foot dry gangrene  Future angiogram with possible intervention Wounds are still dry and motor is intact  Roxy Horseman 02/12/2020 7:24 AM --  Laboratory Lab Results: Recent Labs    02/10/20 0453  WBC 12.8*  HGB 12.3*  HCT 37.8*  PLT 90*   BMET Recent Labs    02/10/20 0453  NA 134*  K 3.9  CL 94*  CO2 26  GLUCOSE 132*  BUN 20  CREATININE 4.52*  CALCIUM 8.0*    COAG Lab Results  Component Value Date   INR 1.5 (H) 02/10/2020   INR 1.6 (H) 02/09/2020   INR 1.9 (H) 02/08/2020   No results found for: PTT

## 2020-02-12 NOTE — Progress Notes (Signed)
SLP Cancellation Note  Patient Details Name: Sean Best MRN: [id number] DOB: [birth year]   Cancelled treatment:       Reason Eval/Treat Not Completed: Patient at procedure or test/unavailable (HD). Will f/u as able.   Osie Bond., M.A. [facility] Pager [phone] Office [phone]  02/12/2020, 10:03 AM

## 2020-02-12 NOTE — Plan of Care (Signed)
Palliative: Patient in hemodialysis when palliative attempted to visit. PMT to continue to follow.  No charge Quinn Axe, NP Palliative medicine team Team phone [phone] Greater than 50% of this time was spent counseling and coordinating care related to the above assessment and plan.

## 2020-02-12 NOTE — Progress Notes (Signed)
PT Cancellation Note  Patient Details Name: Sean Best MRN: [id number] DOB: Jun 01, 1933   Cancelled Treatment:    Reason Eval/Treat Not Completed: Patient at procedure or test/unavailable (HD).  Wyona Almas, PT, DPT Acute [facility] Services Pager [phone] Office [phone]    Deno Etienne 02/12/2020, 8:53 AM

## 2020-02-12 NOTE — Progress Notes (Signed)
Vancomycin given post hemodialysis treatment; CBG reading low; retested CBG=50; pt alert ; got an order for 1 ampule of D50 from Dr. Roney Jaffe; rechecked after 30 min CBG=97. Had issues with the hemodialysis glucometer; borrowed one from [facility] and3CBG=97.

## 2020-02-12 NOTE — Progress Notes (Signed)
PROGRESS NOTE    Sean Best  IRC:[id number] DOB: January 24, 1934 DOA: 02/23/2020 PCP: Seward Carol, MD   Brief Narrative:  HPI On 02/19/2020 by Dr. Neva Seat Burnice Oestreicher is a 86 y.o. male with medical history significant of history of GI bleed, status post cholecystectomy in 2019, history of pancreatitis, anemia, aortic stenosis, heart block status post pacemaker, diabetes, ESRD on HD MWF, gout, history of CVA, hyperlipidemia, hypertension, CAD, prostate cancer who presents with altered mental status.  Patient has had about a week or so of increasing weakness and fatigue and now confusion.  He has had some constipation and decreased p.o. intake according to himself and cooperated by his daughter.  She also states he has had a foot wound that she has been caring for the past 3 weeks.  He denies any fevers, cough, chest pain, shortness of breath, significant abdominal pain, nausea.  Interim history Admitted with hepatic encephalopathy, hepatitis and concern for infection however unclear source.  Also with hypoxic respiratory failure.  Patient does have memory issues at baseline, question whether his mental status is at baseline.  Patient also is a dialysis patient.  Palliative care consulted for goals of care.  Vascular surgery also consulted for right foot wound and plans for angiogram at some point. Assessment & Plan   Acute hypoxic respiratory failure -Patient had hypoxia the morning of 02/10/2020 and was placed on supplemental oxygen -Chest x-ray was unremarkable for volume overload or pneumonia -Attending at that time discussed with nephrology for possible dialysis however emergent dialysis was not needed -Patient continues to be on 4 L of oxygen  Acute hepatic encephalopathy with underlying memory deficits -Question whether patient has underlying dementia given that his daughter states he has memory issues at baseline.  Patient is also fully independent and does his ADLs as per  family. -Patient presented with an elevated ammonia level -Placed on oral lactulose -CT head without acute findings -Currently patient is alert and oriented x1 (self only)  Dysphagia -Speech therapy consulted -Currently on dysphagia 2 diet  Acute hepatitis/elevated LFTs -Patient noted to have cholecystectomy in 2019 -CT did not show any focal abnormalities -Hepatitis panel was unremarkable -Gastroenterology was consulted -Question shock liver given the patient did have hypotension -AMA, alpha-fetoprotein were normal -Patient was given oral vitamin K -LFTs are trending downward, continue to monitor CMP  Diabetes mellitus, type II complicated by hypoglycemia -Patient presented with hypoglycemia and was placed on a D10 drip -Continue to monitor  SIRS/leukocytosis and hypotension -Concern for infection -Blood cultures negative to date -Patient was started on IV ceftriaxone and vancomycin -?  Related to foot infection  ESRD -Patient dialyzes on Monday, Wednesday, -Today nephrology consulted and appreciated  Essential hypertension/aortic stenosis/heart block -Patient with pacemaker -Continue metoprolol  Hyperlipidemia/history of CVA -Statin held due to elevated LFTs  Right foot wound/PVD -Wound care consulted -Vascular surgery consulted and appreciated, does not feel that his foot is a source of infection.  Does plan for angiogram at some point  Goals of care -Palliative care consulted and appreciated, patient was made DNR on 12/10.  If patient declines would consider hospice   DVT Prophylaxis SCDs  Code Status: DNR  Family Communication: None at bedside  Disposition Plan:  Status is: Inpatient  Remains inpatient appropriate because:IV treatments appropriate due to intensity of illness or inability to take PO and Inpatient level of care appropriate due to severity of illness   Dispo: The patient is from: Home  Anticipated d/c is to: TBD               Anticipated d/c date is: 3 days              Patient currently is not medically stable to d/c.   Consultants Nephrology Vascular surgery Palliative care gastroenterology  Procedures  Abd [place]  Antibiotics   Anti-infectives (From admission, onward)   Start     Dose/Rate Route Frequency Ordered Stop   02/09/20 1200  vancomycin (VANCOCIN) IVPB 750 mg/150 ml premix        750 mg 150 mL/hr over 60 Minutes Intravenous Every M-W-F (Hemodialysis) 02/08/20 1250     02/08/20 1400  vancomycin (VANCOREADY) IVPB 750 mg/150 mL        750 mg 150 mL/hr over 60 Minutes Intravenous  Once 02/08/20 1250 02/08/20 1602   02/07/20 1200  vancomycin (VANCOCIN) IVPB 1000 mg/200 mL premix  Status:  Discontinued        1,000 mg 200 mL/hr over 60 Minutes Intravenous Every M-W-F (Hemodialysis) 02/06/20 1041 02/07/20 1106   02/07/20 1200  vancomycin (VANCOREADY) IVPB 750 mg/150 mL        750 mg 150 mL/hr over 60 Minutes Intravenous  Once 02/07/20 1105 02/07/20 1348   02/07/20 1106  vancomycin variable dose per unstable renal function (pharmacist dosing)  Status:  Discontinued         Does not apply See admin instructions 02/07/20 1106 02/08/20 1250   02/06/20 2100  cefTRIAXone (ROCEPHIN) 2 g in sodium chloride 0.9 % 100 mL IVPB        2 g 200 mL/hr over 30 Minutes Intravenous Every 24 hours 02/27/2020 2215     02/06/20 1130  vancomycin (VANCOREADY) IVPB 2000 mg/400 mL        2,000 mg 200 mL/hr over 120 Minutes Intravenous  Once 02/06/20 1041 02/06/20 1311   02/20/2020 1545  cefTRIAXone (ROCEPHIN) 2 g in sodium chloride 0.9 % 100 mL IVPB        2 g 200 mL/hr over 30 Minutes Intravenous  Once 02/20/2020 1541 02/16/2020 2112      Subjective:   [name] seen and examined today.  Patient with no complaints this morning.  (Baseline confusion)  Objective:   Vitals:   02/12/20 1030 02/12/20 1045 02/12/20 1100 02/12/20 1120  BP: 126/88 121/75 (!) 129/106 (!) 143/131  Pulse:  (!) 118 (!) 141   Resp: 17 19 (!)  21 19  Temp:      TempSrc:      SpO2:  (!) 87% (!) 86%   Weight:      Height:        Intake/Output Summary (Last 24 hours) at 02/12/2020 1123 Last data filed at 02/11/2020 1800 Gross per 24 hour  Intake 360 ml  Output --  Net 360 ml   Filed Weights   02/11/20 0400 02/12/20 0400 02/12/20 0845  Weight: 78.2 kg 79.1 kg 79 kg    Exam  General: Well developed, chronically ill-appearing, NAD  HEENT: NCAT, mucous membranes moist.   Cardiovascular: S1 S2 auscultated, RRR  Respiratory: Clear to auscultation bilaterally   Abdomen: Soft, nontender, nondistended, + bowel sounds  Extremities: warm dry without cyanosis clubbing or edema.  Right foot with dry gangrene  Neuro: AAOx1 (self only) nonfocal  Psych: pleasantly confused   Data Reviewed: I have personally reviewed following labs and imaging studies  CBC: Recent Labs  Lab 02/21/2020 1312 02/06/20 0800 02/07/20 0507 02/08/20 0102 02/09/20  0234 02/10/20 0453 02/12/20 0730  WBC 15.8*   < > 16.5* 14.5* 13.6* 12.8* 26.3*  NEUTROABS 14.2*  --   --   --   --   --   --   HGB 13.0   < > 12.1* 12.7* 12.5* 12.3* 12.4*  HCT 44.1   < > 37.8* 40.5 40.4 37.8* 37.7*  MCV 87.8   < > 83.6 82.3 82.8 81.1 80.4  PLT 225   < > 134* 112* 84* 90* 97*   < > = values in this interval not displayed.   Basic Metabolic Panel: Recent Labs  Lab 02/07/20 0507 02/08/20 0102 02/09/20 0234 02/10/20 0453 02/12/20 0730  NA 137 133* 136 134* 134*  K 3.8 3.7 3.8 3.9 4.1  CL 94* 93* 94* 94* 95*  CO2 23 24 26 26 23   GLUCOSE 88 135* 113* 132* 117*  BUN 23 28* 21 20 49*  CREATININE 5.48* 5.95* 5.02* 4.52* 6.70*  CALCIUM 8.3* 8.1* 7.9* 8.0* 8.4*   GFR: Estimated Creatinine Clearance: 8 mL/min (A) (by C-G formula based on SCr of 6.7 mg/dL (H)). Liver Function Tests: Recent Labs  Lab 02/07/20 0507 02/08/20 0102 02/09/20 0234 02/10/20 0453 02/12/20 0730  AST 1,490* 672* 381* 239* 99*  ALT 1,219* 909* 685* 510* 231*  ALKPHOS 136* 129*  134* 128* 124  BILITOT 1.6* 1.1 1.3* 1.3* 1.2  PROT 5.3* 5.1* 5.4* 5.4* 5.0*  ALBUMIN 2.6* 2.4* 2.5* 2.5* 2.1*   No results for input(s): LIPASE, AMYLASE in the last 168 hours. Recent Labs  Lab 02/08/2020 1748 02/06/20 0800 02/07/20 0903  AMMONIA 77* 49* 33   Coagulation Profile: Recent Labs  Lab 02/07/20 0507 02/08/20 0102 02/09/20 0234 02/10/20 0453 02/12/20 0730  INR 2.4* 1.9* 1.6* 1.5* 1.6*   Cardiac Enzymes: Recent Labs  Lab 02/06/20 1042  CKTOTAL 182   BNP (last 3 results) No results for input(s): PROBNP in the last 8760 hours. HbA1C: No results for input(s): HGBA1C in the last 72 hours. CBG: Recent Labs  Lab 02/10/20 1630 02/11/20 1210 02/11/20 1622 02/12/20 0345 02/12/20 0738  GLUCAP 119* 86 77 98 113*   Lipid Profile: No results for input(s): CHOL, HDL, LDLCALC, TRIG, CHOLHDL, LDLDIRECT in the last 72 hours. Thyroid Function Tests: No results for input(s): TSH, T4TOTAL, FREET4, T3FREE, THYROIDAB in the last 72 hours. Anemia Panel: No results for input(s): VITAMINB12, FOLATE, FERRITIN, TIBC, IRON, RETICCTPCT in the last 72 hours. Urine analysis:    Component Value Date/Time   COLORURINE YELLOW 12/29/2014 0550   APPEARANCEUR CLOUDY (A) 12/29/2014 0550   LABSPEC 1.020 12/29/2014 0550   PHURINE 5.0 12/29/2014 0550   GLUCOSEU NEGATIVE 12/29/2014 0550   HGBUR SMALL (A) 12/29/2014 0550   BILIRUBINUR NEGATIVE 12/29/2014 0550   KETONESUR NEGATIVE 12/29/2014 0550   PROTEINUR 100 (A) 12/29/2014 0550   UROBILINOGEN 0.2 12/29/2014 0550   NITRITE NEGATIVE 12/29/2014 0550   LEUKOCYTESUR SMALL (A) 12/29/2014 0550   Sepsis Labs: @LABRCNTIP (procalcitonin:4,lacticidven:4)  ) Recent Results (from the past 240 hour(s))  Blood culture (routine x 2)     Status: None   Collection Time: 02/04/2020  1:13 PM   Specimen: BLOOD  Result Value Ref Range Status   Specimen Description BLOOD SITE NOT SPECIFIED  Final   Special Requests AEROBIC BOTTLE ONLY Blood Culture  adequate volume  Final   Culture   Final    NO GROWTH 5 DAYS Performed at [facility], [address], [place], [place] [address]  Report Status 02/10/2020 FINAL  Final  Blood culture (routine x 2)     Status: None   Collection Time: 02/22/2020  1:15 PM   Specimen: BLOOD  Result Value Ref Range Status   Specimen Description BLOOD SITE NOT SPECIFIED  Final   Special Requests AEROBIC BOTTLE ONLY Blood Culture adequate volume  Final   Culture   Final    NO GROWTH 5 DAYS Performed at [facility], [address], [place], [place] [address]    Report Status 02/10/2020 FINAL  Final  Resp Panel by RT-PCR (Flu A&B, Covid) Nasopharyngeal Swab     Status: None   Collection Time: 02/24/2020  1:54 PM   Specimen: Nasopharyngeal Swab; Nasopharyngeal(NP) swabs in vial transport medium  Result Value Ref Range Status   SARS Coronavirus 2 by RT PCR NEGATIVE NEGATIVE Final    Comment: (NOTE) SARS-CoV-2 target nucleic acids are NOT DETECTED.  The SARS-CoV-2 RNA is generally detectable in upper respiratory specimens during the acute phase of infection. The lowest concentration of SARS-CoV-2 viral copies this assay can detect is 138 copies/mL. A negative result does not preclude SARS-Cov-2 infection and should not be used as the sole basis for treatment or other patient management decisions. A negative result may occur with  improper specimen collection/handling, submission of specimen other than nasopharyngeal swab, presence of viral mutation(s) within the areas targeted by this assay, and inadequate number of viral copies(<138 copies/mL). A negative result must be combined with clinical observations, patient history, and epidemiological information. The expected result is Negative.  Fact Sheet for Patients:  EntrepreneurPulse.com.au  Fact Sheet for Healthcare Providers:  IncredibleEmployment.be  This test is no t yet approved or cleared by the  [place] FDA and  has been authorized for detection and/or diagnosis of SARS-CoV-2 by FDA under an Emergency Use Authorization (EUA). This EUA will remain  in effect (meaning this test can be used) for the duration of the COVID-19 declaration under Section 564(b)(1) of the Act, 21 U.S.C.section 360bbb-3(b)(1), unless the authorization is terminated  or revoked sooner.       Influenza A by PCR NEGATIVE NEGATIVE Final   Influenza B by PCR NEGATIVE NEGATIVE Final    Comment: (NOTE) The Xpert Xpress SARS-CoV-2/FLU/RSV plus assay is intended as an aid in the diagnosis of influenza from Nasopharyngeal swab specimens and should not be used as a sole basis for treatment. Nasal washings and aspirates are unacceptable for Xpert Xpress SARS-CoV-2/FLU/RSV testing.  Fact Sheet for Patients: EntrepreneurPulse.com.au  Fact Sheet for Healthcare Providers: IncredibleEmployment.be  This test is not yet approved or cleared by the [place] FDA and has been authorized for detection and/or diagnosis of SARS-CoV-2 by FDA under an Emergency Use Authorization (EUA). This EUA will remain in effect (meaning this test can be used) for the duration of the COVID-19 declaration under Section 564(b)(1) of the Act, 21 U.S.C. section 360bbb-3(b)(1), unless the authorization is terminated or revoked.  Performed at [facility], [place] [address], [place], [place] [address]       Radiology Studies: No results found.   Scheduled Meds: . aspirin  81 mg Oral Daily  . calcitRIOL  1 mcg Oral Q M,W,F-HD  . calcium acetate  2,001 mg Oral TID WC  . Chlorhexidine Gluconate Cloth  6 each Topical Q0600  . cinacalcet  90 mg Oral Q M,W,F  . feeding supplement (NEPRO CARB STEADY)  237 mL Oral BID BM  . ferrous sulfate  325 mg Oral  Q breakfast  . lactulose  10 g Oral TID  . metoprolol tartrate  12.5 mg Oral BID  . sodium chloride flush  3 mL Intravenous Q12H    Continuous Infusions: . cefTRIAXone (ROCEPHIN)  IV 2 g (02/11/20 2032)  . dextrose 30 mL/hr at 02/11/20 1842  . vancomycin Stopped (02/09/20 0955)     LOS: 7 days   Time Spent in minutes   45 minutes  Porschia Willbanks D.O. on 02/12/2020 at 11:23 AM  Between 7am to 7pm - Please see pager noted on [email or web address]  After 7pm go to [email or web address]  And look for the night coverage person covering for me after hours  [facility] Office  [phone]

## 2020-02-12 NOTE — Progress Notes (Signed)
[facility] Progress Note   Subjective:   Patient seen and examined at bedside in dialysis prior to treatments.  Pleasantly confused.  Says he is feeling a little better.  More alert today.  Denies CP, SOB, edema and n/v/d.   Objective Vitals:   02/12/20 0000 02/12/20 0400 02/12/20 0430 02/12/20 0733  BP: 100/73  97/60 102/68  Pulse: 76  66   Resp: 20  18 18   Temp: 97.7 F (36.5 C)  (!) 97.5 F (36.4 C) 98 F (36.7 C)  TempSrc: Oral  Oral Oral  SpO2: 95%  96% 95%  Weight:  79.1 kg    Height:  5\' 5"  (1.651 m)     Physical Exam General:chronically ill appearing, pleasantly confused male in NAD Heart:RRR Lungs:CTAB, nml WOB on 4L via [place] Abdomen:soft, NTND Extremities:no LE edema Dialysis Access: LU AVF +b   Filed Weights   02/11/20 0000 02/11/20 0400 02/12/20 0400  Weight: 78.2 kg 78.2 kg 79.1 kg    Intake/Output Summary (Last 24 hours) at 02/12/2020 0840 Last data filed at 02/11/2020 1800 Gross per 24 hour  Intake 480 ml  Output --  Net 480 ml    Additional Objective Labs: Basic Metabolic Panel: Recent Labs  Lab 02/09/20 0234 02/10/20 0453 02/12/20 0730  NA 136 134* 134*  K 3.8 3.9 4.1  CL 94* 94* 95*  CO2 26 26 23   GLUCOSE 113* 132* 117*  BUN 21 20 49*  CREATININE 5.02* 4.52* 6.70*  CALCIUM 7.9* 8.0* 8.4*   Liver Function Tests: Recent Labs  Lab 02/09/20 0234 02/10/20 0453 02/12/20 0730  AST 381* 239* 99*  ALT 685* 510* 231*  ALKPHOS 134* 128* 124  BILITOT 1.3* 1.3* 1.2  PROT 5.4* 5.4* 5.0*  ALBUMIN 2.5* 2.5* 2.1*   CBC: Recent Labs  Lab 02/12/2020 1312 02/06/20 0800 02/07/20 0507 02/08/20 0102 02/09/20 0234 02/10/20 0453 02/12/20 0730  WBC 15.8*   < > 16.5* 14.5* 13.6* 12.8* 26.3*  NEUTROABS 14.2*  --   --   --   --   --   --   HGB 13.0   < > 12.1* 12.7* 12.5* 12.3* 12.4*  HCT 44.1   < > 37.8* 40.5 40.4 37.8* 37.7*  MCV 87.8   < > 83.6 82.3 82.8 81.1 80.4  PLT 225   < > 134* 112* 84* 90* 97*   < > = values in this  interval not displayed.    Cardiac Enzymes: Recent Labs  Lab 02/06/20 1042  CKTOTAL 182   CBG: Recent Labs  Lab 02/10/20 1630 02/11/20 1210 02/11/20 1622 02/12/20 0345 02/12/20 0738  GLUCAP 119* 86 77 98 113*   Iron Studies: No results for input(s): IRON, TIBC, TRANSFERRIN, FERRITIN in the last 72 hours. Lab Results  Component Value Date   INR 1.6 (H) 02/12/2020   INR 1.5 (H) 02/10/2020   INR 1.6 (H) 02/09/2020   Studies/Results: DG CHEST PORT 1 VIEW  Result Date: 02/10/2020 CLINICAL DATA:  Hypoxia. EXAM: PORTABLE CHEST 1 VIEW COMPARISON:  February 05, 2020. FINDINGS: Stable cardiomegaly. No pneumothorax or pleural effusion is noted. Lungs are clear. Right-sided pacemaker is unchanged in position. Bony thorax is unremarkable. IMPRESSION: No active disease. Electronically Signed   By: Marijo Conception M.D.   On: 02/10/2020 10:45    Medications:  cefTRIAXone (ROCEPHIN)  IV 2 g (02/11/20 2032)   dextrose 30 mL/hr at 02/11/20 1842   vancomycin Stopped (02/09/20 0955)    aspirin  81 mg  Oral Daily   calcitRIOL  1 mcg Oral Q M,W,F-HD   calcium acetate  2,001 mg Oral TID WC   Chlorhexidine Gluconate Cloth  6 each Topical Q0600   cinacalcet  90 mg Oral Q M,W,F   feeding supplement (NEPRO CARB STEADY)  237 mL Oral BID BM   ferrous sulfate  325 mg Oral Q breakfast   lactulose  10 g Oral TID   metoprolol tartrate  12.5 mg Oral BID   sodium chloride flush  3 mL Intravenous Q12H    Dialysis Orders: [facility]  MWF, 4 hours, 78 kg EDW, 2K, 2 calcium bath, UF profile 2, left forearm AV fistula access, no heparin, no ESA, calcitriol 0.75 mcg p.o. daily  Assessment/Plan: 1. ESRD -HD on MWF. HD today per regular schedule, labs pending. last K 3.9.  No Heparin. 2. Encephalopathy -elevated ammoniaandelevated LFTs improving. work-up per admit/GI.known history of pancreatitis.hypoglycemia.CT head no acute findings. BC NGTD. On lactulose.  Labs to be collected  pre HD.Seen by palliative on 12/10 - made DNR and noted" if patient declines will consider hospice"continue HD until further discussion with family 3. Hypoxemia : remains on 4L O2 via [place].  Denies SOB.  CXR 12/11 with no acute disease.  4. Hypertension/volume -intermittenthypotension, BP soft this AM. On low dose lopressor. Repeat CXR 12/11 with no active disease. Does not appear volume overloaded.  UF as tolerated.  5. Anemia -Hgbstable 12.3no ESAindicated 6. Metabolic bone disease -CCa in goal. Will check phos. Continue binders, sensipar and VDRA.  7. Nutrition -Albumin2.5noted pured diet per speech/protein supplement 8. Right lower extremity foot wounds per Dr. Luther Parody team noted patient/daughterpreviouslyrefused angiogramunlesswounds worsen,placed on IV antibiotics wound care. Possible angiogram when stable.  9. Elevated LFTs with coagulopathy encephalopathy-GI following suspect ischemic hepatitis.  Labs have been improving - collecting today pre HD. On oral lactulose TID. Givenvitamin K for coagulopathy, will hold heparin on hemodialysis. 10. Diabetes mellitus type 2hypoglycemicon admission -plan per admit team,  Continues to require D10 dripat44ml/hr   Jen Mow, PA-C [place] 02/12/2020,8:40 AM  LOS: 7 days

## 2020-02-12 NOTE — Progress Notes (Signed)
Pt returned from HD. BG was less than 10, amp of D50 was given, recheck BG was 94. Pt is still hard to arouse and only follows very basic commands. Dr. Ree Kida aware of blood glucose readings. Unfortunately, pt is at high risk for volume overload and is currently on D10 at 35mL/hr. Plan is to treat blood glucose with intermittent amp of d50 when needed. No other changes in VS.

## 2020-02-13 ENCOUNTER — Ambulatory Visit: Payer: Medicare HMO

## 2020-02-13 LAB — COMPREHENSIVE METABOLIC PANEL
ALT: 182 U/L — ABNORMAL HIGH (ref 0–44)
AST: 97 U/L — ABNORMAL HIGH (ref 15–41)
Albumin: 2 g/dL — ABNORMAL LOW (ref 3.5–5.0)
Alkaline Phosphatase: 123 U/L (ref 38–126)
Anion gap: 14 (ref 5–15)
BUN: 27 mg/dL — ABNORMAL HIGH (ref 8–23)
CO2: 25 mmol/L (ref 22–32)
Calcium: 8.1 mg/dL — ABNORMAL LOW (ref 8.9–10.3)
Chloride: 95 mmol/L — ABNORMAL LOW (ref 98–111)
Creatinine, Ser: 4.55 mg/dL — ABNORMAL HIGH (ref 0.61–1.24)
GFR, Estimated: 12 mL/min — ABNORMAL LOW (ref >60)
Glucose, Bld: 116 mg/dL — ABNORMAL HIGH (ref 70–99)
Potassium: 3.9 mmol/L (ref 3.5–5.1)
Sodium: 134 mmol/L — ABNORMAL LOW (ref 135–145)
Total Bilirubin: 1.2 mg/dL (ref 0.3–1.2)
Total Protein: 4.9 g/dL — ABNORMAL LOW (ref 6.5–8.1)

## 2020-02-13 LAB — GLUCOSE, CAPILLARY
Glucose-Capillary: 105 mg/dL — ABNORMAL HIGH (ref 70–99)
Glucose-Capillary: 112 mg/dL — ABNORMAL HIGH (ref 70–99)
Glucose-Capillary: 114 mg/dL — ABNORMAL HIGH (ref 70–99)
Glucose-Capillary: 49 mg/dL — ABNORMAL LOW (ref 70–99)

## 2020-02-13 LAB — CBC
HCT: 36.8 % — ABNORMAL LOW (ref 39.0–52.0)
Hemoglobin: 12.3 g/dL — ABNORMAL LOW (ref 13.0–17.0)
MCH: 26.7 pg (ref 26.0–34.0)
MCHC: 33.4 g/dL (ref 30.0–36.0)
MCV: 79.8 fL — ABNORMAL LOW (ref 80.0–100.0)
Platelets: 98 10*3/uL — ABNORMAL LOW (ref 150–400)
RBC: 4.61 MIL/uL (ref 4.22–5.81)
RDW: 22.6 % — ABNORMAL HIGH (ref 11.5–15.5)
WBC: 26.5 10*3/uL — ABNORMAL HIGH (ref 4.0–10.5)
nRBC: 0.1 % (ref 0.0–0.2)

## 2020-02-13 LAB — MRSA PCR SCREENING: MRSA by PCR: NEGATIVE

## 2020-02-13 LAB — PROTIME-INR
INR: 1.5 — ABNORMAL HIGH (ref 0.8–1.2)
Prothrombin Time: 18 seconds — ABNORMAL HIGH (ref 11.4–15.2)

## 2020-02-13 MED ORDER — OXYCODONE HCL 5 MG PO TABS: 5.0000 mg | ORAL_TABLET | Freq: Four times a day (QID) | ORAL | Status: DC | PRN

## 2020-02-13 MED ORDER — ORAL CARE MOUTH RINSE
15.0000 mL | Freq: Two times a day (BID) | OROMUCOSAL | Status: DC
  Administered 2020-02-13: 09:00:00 15 mL via OROMUCOSAL

## 2020-02-13 MED ORDER — DEXTROSE 50 % IV SOLN
INTRAVENOUS | Status: AC
  Filled 2020-02-13: qty 50

## 2020-02-15 LAB — GLUCOSE, CAPILLARY: Glucose-Capillary: <10 mg/dL — CL (ref 70–99)

## 2020-02-20 ENCOUNTER — Encounter (HOSPITAL_BASED_OUTPATIENT_CLINIC_OR_DEPARTMENT_OTHER): Payer: Medicare HMO | Admitting: Internal Medicine

## 2020-02-27 DIAGNOSIS — M109 Gout, unspecified: Secondary | ICD-10-CM | POA: Diagnosis not present

## 2020-02-27 DIAGNOSIS — J9601 Acute respiratory failure with hypoxia: Secondary | ICD-10-CM | POA: Diagnosis not present

## 2020-02-27 DIAGNOSIS — Z950 Presence of cardiac pacemaker: Secondary | ICD-10-CM | POA: Diagnosis not present

## 2020-02-27 DIAGNOSIS — E1122 Type 2 diabetes mellitus with diabetic chronic kidney disease: Secondary | ICD-10-CM | POA: Diagnosis not present

## 2020-03-02 NOTE — Progress Notes (Signed)
Palliative: Mr. Leu is lying quietly in bed.  He does not respond to voice or touch.  His respiratory rate is even and unlabored.  There is no family at bedside at this time.  It seems that Mr. Brow would benefit from hospice care.  Plan: Patient died before PMT was able to call daughter to recommend hospice care.  15 minutes Quinn Axe, NP Palliative medicine team Team phone [phone] Greater than 50% of this time was spent counseling and coordinating care related to the above assessment and plan.

## 2020-03-02 NOTE — Progress Notes (Signed)
   02-18-2020 1300  Clinical Encounter Type  Visited With Patient and family together  Visit Type Death  Referral From Nurse  Consult/Referral To Chaplain  Spiritual Encounters  Spiritual Needs Emotional;Grief support;Prayer  The chaplain met with the patient's daughter [name]) and offered emotional and grief support. The chaplain provided active listen while the daughter shared heartfelt stories about her father. She will be contacting [facility]. The patient placement card was given to the daughter by the chaplain. The daughter requested prayer and the chaplain said of prayer of comfort for the patient's daughter and family. The chaplain will follow up as needed.

## 2020-03-02 NOTE — Progress Notes (Signed)
Attempted to call daughter and discuss plan, phone went straight to VM. Will call back shortly.

## 2020-03-02 NOTE — Progress Notes (Signed)
  Progress Note    February 14, 2020 7:51 AM Hospital Day 7  Subjective:  Denies any pain in his foot.  Afebrile   Vitals:   02/14/20 0350 02-14-2020 0525  BP: 92/62 92/62  Pulse: 73 62  Resp: 19   Temp: 97.9 F (36.6 C)   SpO2: 98% 95%    Physical Exam: General:  No distress; wakes easily and answers questions.  Lungs:  Non labored Extremities:  +thrill left arm fistula Bilateral feet cool to touch     CBC    Component Value Date/Time   WBC 26.5 (H) February 14, 2020 0434   RBC 4.61 02-14-2020 0434   HGB 12.3 (L) 02-14-2020 0434   HCT 36.8 (L) 02-14-2020 0434   PLT 98 (L) Feb 14, 2020 0434   MCV 79.8 (L) 02-14-2020 0434   MCH 26.7 2020-02-14 0434   MCHC 33.4 02-14-20 0434   RDW 22.6 (H) February 14, 2020 0434   LYMPHSABS 0.8 02/10/2020 1312   MONOABS 0.8 02/02/2020 1312   EOSABS 0.0 02/19/2020 1312   BASOSABS 0.0 02/15/2020 1312    BMET    Component Value Date/Time   NA 134 (L) 2020-02-14 0434   K 3.9 2020/02/14 0434   CL 95 (L) Feb 14, 2020 0434   CO2 25 Feb 14, 2020 0434   GLUCOSE 116 (H) 02-14-2020 0434   BUN 27 (H) 02/14/2020 0434   CREATININE 4.55 (H) 02/14/2020 0434   CALCIUM 8.1 (L) February 14, 2020 0434   GFRNONAA 12 (L) Feb 14, 2020 0434   GFRAA 8 (L) 09/25/2019 0421    INR    Component Value Date/Time   INR 1.5 (H) 02-14-2020 0434     Intake/Output Summary (Last 24 hours) at 2020-02-14 0751 Last data filed at 2020/02/14 0649 Gross per 24 hour  Intake 1133.57 ml  Output 0 ml  Net 1133.57 ml     Assessment/Plan:  87 y.o. male with right foot wounds Hospital Day 7  -pt with dry gangrenous changes to right foot and leukocytosis.  Foot wounds are dry and do not appear to be source of infection. -palliative care following.  Pt was made DNR 12/10.  Per [facility], if pt declines, considering hospice.  Palliative care to meet with pt today.  -possible angiogram pending palliative care meeting and goals.  -Dr. Donnetta Hutching to see pt later today   Leontine Locket,  PA-C [facility] [phone] 02-14-2020 7:51 AM

## 2020-03-02 NOTE — Progress Notes (Signed)
  Speech Language Pathology Treatment: Dysphagia  Patient Details Name: Sean Best MRN: [id number] DOB: February 08, 1934 Today's Date: 02/18/20 Time: 8206-0156 SLP Time Calculation (min) (ACUTE ONLY): 8 min  Assessment / Plan / Recommendation Clinical Impression  Pt is too lethargic for PO trials this morning. SLP provided Max multimodal cues to try to increase alertness but without improvement. His breakfast tray at the bedside appears to be untouched. Will leave current diet order in place to be consumed when he is sufficiently alert, but I am concerned about adequate PO intake when also considering limited intake reported in previous SLP visits even when he was more alert. Will continue to follow acutely.   HPI HPI: 87 year old with past medical history significant for GI bleed, status post cholecystectomy 2019, history of pancreatitis, anemia, aortic stenosis, heart block status post pacemaker, diabetes, ESRD on hemodialysis MWF, gout, history of CVA, hyperlipidemia, hypertension, prostate cancer, CAD who presents with altered mental status.  Patient has had a week of increasing weakness and fatigue and now presents with confusion.  He has been constipated, decreased oral intake.  He has right foot wound that he has been caring at home for the last 3 weeks.        SLP Plan  Continue with current plan of care       Recommendations  Diet recommendations: Dysphagia 2 (fine chop);Thin liquid Liquids provided via: Straw;Cup Medication Administration: Crushed with puree Supervision: Staff to assist with self feeding Compensations: Slow rate;Small sips/bites;Minimize environmental distractions Postural Changes and/or Swallow Maneuvers: Seated upright 90 degrees;Upright 30-60 min after meal                Oral Care Recommendations: Oral care BID Follow up Recommendations: Skilled Nursing facility SLP Visit Diagnosis: Dysphagia, oropharyngeal phase (R13.12) Plan: Continue with current  plan of care       GO                Osie Bond., M.A. [facility] Pager [phone] Office [phone]  February 18, 2020, 10:02 AM

## 2020-03-02 NOTE — Progress Notes (Signed)
Wound care done on right toes. Wound is dry, black, with scant amount of blood coming from underneath 3rd toe. Looks like a deep tissue injury to right heel and left hip. Redness to groin. Peri care performed.   Pedal pulses not palpated, used doppler-couldn't not hear. 2nd RN tried and noted absent pedal pulses.

## 2020-03-02 NOTE — Progress Notes (Signed)
Patient resting, VS stable (after removing BP cuff from left leg and placing on right arm-BP was either really low or really high and inconsistent). O2 weaned down from 6L to 4L (O2 sats 96-98%). Patient opens eyes when you call his name and tries to follow commands, but he is weak. Medications held because patient not awake/alert enough to take meds. Will continue to monitor.

## 2020-03-02 NOTE — Death Summary Note (Signed)
DEATH SUMMARY   Patient Details  Name: Sean Best MRN: [id number] DOB: 17-Sep-1933  Admission/Discharge Information   Admit Date:  2020/02/21  Date of Death: Date of Death: 2020/02/29  Time of Death: Time of Death: 27  Length of Stay: 05/24/2022  Referring Physician: Seward Carol, MD   Reason(s) for Hospitalization  Altered mental status  Diagnoses  Preliminary cause of death: Acute hypoxic respiratory failure Secondary Diagnoses (including complications and co-morbidities):  Acute hepatic encephalopathy with underlying memory deficits Dysphagia Acute hepatitis/elevated LFTs Diabetes mellitus, type II complicated by hypoglycemia SIRS/leukocytosis and hypotension ESRD Essential hypertension/aortic stenosis/heart block Hyperlipidemia/history of CVA Right foot wound/PVD Goals of care   Brief Hospital Course (including significant findings, care, treatment, and services provided and events leading to death)  HPI On 2020/02/21 by Dr. Roselind Rily Murphyis a 87 y.o.malewith medical history significant ofhistory of GI bleed, status post cholecystectomy in 2019, history of pancreatitis, anemia, aortic stenosis, heart block status post pacemaker, diabetes, ESRD on HD MWF, gout, history of CVA, hyperlipidemia, hypertension, CAD, prostate cancer who presents with altered mental status.Patient has had about a week or so of increasing weakness and fatigue and now confusion. He has had some constipation and decreased p.o. intake according to himself and cooperated by his daughter. She also states he has had a foot wound that she has been caring for the past 3 weeks. He denies any fevers, cough, chest pain, shortness of breath, significant abdominal pain, nausea.   Interim history Admitted with hepatic encephalopathy, hepatitis and concern for infection however unclear source.  Also with hypoxic respiratory failure.  Patient does have memory issues at baseline, question whether  his mental status is at baseline.  Patient also is a dialysis patient.  Palliative care consulted for goals of care.  Vascular surgery also consulted for right foot wound and had planned for angiogram. Hospitalization complicated by hypoglycemia.   Today, patient had complained of bilateral foot pain. He continued to have hypoglycemia and was receiving D10 as well as D50amp. He was found to be more lethargic as the morning progressed. Unfortunately, he passed at 1125.    Assessment/Plan Acute hypoxic respiratory failure -Patient had hypoxia the morning of 02/10/2020 and was placed on supplemental oxygen -Chest x-ray was unremarkable for volume overload or pneumonia -Attending at that time discussed with nephrology for possible dialysis however emergent dialysis was not needed -Patient continues to be on 4 L of oxygen  Acute hepatic encephalopathy with underlying memory deficits -Question whether patient has underlying dementia given that his daughter states he has memory issues at baseline.  Patient is also fully independent and does his ADLs as per family. -Patient presented with an elevated ammonia level -Placed on oral lactulose -CT head without acute findings  Dysphagia -Speech therapy consulted -was placed on dysphagia diet  Acute hepatitis/elevated LFTs -Patient noted to have cholecystectomy in 2019 -CT did not show any focal abnormalities -Hepatitis panel was unremarkable -Gastroenterology was consulted -Question shock liver given the patient did have hypotension -AMA, alpha-fetoprotein were normal -Patient was given oral vitamin K -LFTs were trending downward  Diabetes mellitus, type II complicated by hypoglycemia -Patient presented with hypoglycemia and was placed on a D10 drip -Continue to monitor  SIRS/leukocytosis and hypotension -Concern for infection- however no source found -Blood cultures negative to date -Patient was placed on IV ceftriaxone and  vancomycin -?  Related to foot infection  ESRD -Patient dialyzes on Monday, Wednesday, -Today nephrology consulted and appreciated  Essential hypertension/aortic stenosis/heart block -Patient  with pacemaker -Continue metoprolol  Hyperlipidemia/history of CVA -Statin held due to elevated LFTs  Right foot wound/PVD -Wound care consulted -Vascular surgery consulted and appreciated, did not feel that his foot is a source of infection. Plan was for angiogram   Goals of care -Palliative care consulted and appreciated, patient was made DNR on 12/10.  If patient declines would consider hospice -As stated above, patient was seen by palliative earlier today and was noted to be lethargic. Plans were to reach out to patient's family for additional [facility] discussion -He passed at 1125 today.  Pertinent Labs and Studies  Significant Diagnostic Studies CT ABDOMEN PELVIS WO CONTRAST  Result Date: 02/10/2020 CLINICAL DATA:  Abdominal distension with altered mental status and constipation. EXAM: CT ABDOMEN AND PELVIS WITHOUT CONTRAST TECHNIQUE: Multidetector CT imaging of the abdomen and pelvis was performed following the standard protocol without IV contrast. COMPARISON:  September 20, 2019 FINDINGS: Lower chest: No acute abnormality. Hepatobiliary: No focal liver abnormality is seen. Status post cholecystectomy. No biliary dilatation. Pancreas: Unremarkable. No pancreatic ductal dilatation or surrounding inflammatory changes. Spleen: Normal in size without focal abnormality. Adrenals/Urinary Tract: Adrenal glands are unremarkable. Kidneys are normal in size, without obstructing renal calculi or hydronephrosis. Numerous bilateral partially calcified and noncalcified cysts are seen within both kidneys kidneys. Bilateral 2 mm nonobstructing renal stones are seen. The urinary bladder is contracted and subsequently limited in evaluation. Diffuse thick uretero bladder wall thickening is suspected. This is seen on  the prior study. Stomach/Bowel: Stomach is within normal limits. Appendix appears normal. No evidence of bowel wall thickening, distention, or inflammatory changes. Vascular/Lymphatic: Aortic atherosclerosis with stable 3.1 cm infrarenal aneurysmal dilatation. No enlarged abdominal or pelvic lymph nodes. Reproductive: Multiple prostate radiation implantation seeds are seen. Other: No abdominal wall hernia or abnormality. No abdominopelvic ascites. Musculoskeletal: Marked severity chronic and multilevel degenerative changes are seen throughout the lumbar spine. IMPRESSION: 1. Bilateral 2 mm nonobstructing renal stones. 2. Numerous bilateral partially calcified and noncalcified renal cysts. 3. Diffuse urinary bladder wall thickening which is seen on the prior study and may represent sequelae associated with cystitis. 4. Multiple prostate radiation implantation seeds. 5. Aortic atherosclerosis. Aortic Atherosclerosis (ICD10-I70.0). Electronically Signed   By: Virgina Norfolk M.D.   On: 02/16/2020 21:09   DG Abdomen 1 View  Result Date: 02/23/2020 CLINICAL DATA:  Weakness, no bowel movement for 1 week EXAM: ABDOMEN - 1 VIEW COMPARISON:  September 20, 2019 FINDINGS: Brachytherapy seeds in the prostate. Surgical clips in the RIGHT upper quadrant compatible with prior cholecystectomy. Upper abdomen excluded from view.  Stomach with mild distension. Gas and stool in the rectum. Question of mild haustral thickening of the distal transverse colon. No signs of small bowel obstruction. Faint calcification overlies the LEFT abdomen more lateral than expected for ureteral calculus though ureteral calculus not excluded measuring 4 mm. Evidence of calcified atheromatous plaque in the abdominal aorta extending in the iliac vessels. On limited assessment no acute skeletal process. IMPRESSION: 1. Question of some haustral thickening of the distal transverse colon, findings could be seen in the setting of colitis. No sign of bowel  obstruction. 2. Potential mid LEFT ureteral calculus. 3. Upper abdomen excluded from view 4. Signs of aortoiliac atherosclerosis. Electronically Signed   By: Zetta Bills M.D.   On: 02/16/2020 13:06   CT HEAD WO CONTRAST  Result Date: 02/23/2020 CLINICAL DATA:  Decreased level of consciousness EXAM: CT HEAD WITHOUT CONTRAST TECHNIQUE: Contiguous axial images were obtained from the base of the skull  through the vertex without intravenous contrast. COMPARISON:  10/17/2018 FINDINGS: Brain: There is atrophy and chronic small vessel disease changes. No acute intracranial abnormality. Specifically, no hemorrhage, hydrocephalus, mass lesion, acute infarction, or significant intracranial injury. Vascular: No hyperdense vessel or unexpected calcification. Skull: No acute calvarial abnormality. Sinuses/Orbits: No acute findings Other: None IMPRESSION: Atrophy, chronic microvascular disease. No acute intracranial abnormality. Electronically Signed   By: Rolm Baptise M.D.   On: 02/27/2020 14:05   US Abdomen Limited  Result Date: 02/20/2020 CLINICAL DATA:  Elevated LFTs EXAM: ULTRASOUND ABDOMEN LIMITED RIGHT UPPER QUADRANT COMPARISON:  None. FINDINGS: Gallbladder: The patient is status post cholecystectomy. No biliary ductal dilation. Common bile duct: Diameter: 1 cm Liver: No focal lesion identified. Within normal limits in parenchymal echogenicity. Portal vein is patent on color Doppler imaging with normal direction of blood flow towards the liver. Other: Small amount of perihepatic ascites is seen. IMPRESSION: Status post cholecystectomy. Mild prominence of the common bile duct which may be due to postsurgical dilation, no definite shadowing stones are seen. Small amount of perihepatic ascites. Electronically Signed   By: Prudencio Pair M.D.   On: 02/10/2020 19:53   DG CHEST PORT 1 VIEW  Result Date: 02/10/2020 CLINICAL DATA:  Hypoxia. EXAM: PORTABLE CHEST 1 VIEW COMPARISON:  February 05, 2020. FINDINGS: Stable  cardiomegaly. No pneumothorax or pleural effusion is noted. Lungs are clear. Right-sided pacemaker is unchanged in position. Bony thorax is unremarkable. IMPRESSION: No active disease. Electronically Signed   By: Marijo Conception M.D.   On: 02/10/2020 10:45   DG Chest Portable 1 View  Result Date: 02/04/2020 CLINICAL DATA:  Weakness for 1 week, no bowel movements for 1 week EXAM: PORTABLE CHEST 1 VIEW COMPARISON:  March 29, 2019 FINDINGS: Trachea midline. Heart size remains enlarged. RIGHT-sided pacer device, 2 lead with power pack over the RIGHT chest with similar appearance. Engorgement of central pulmonary vasculature. No lobar consolidation or pleural effusion. On limited assessment no acute skeletal process. IMPRESSION: Cardiomegaly with vascular congestion. Electronically Signed   By: Zetta Bills M.D.   On: 02/19/2020 13:03   DG Foot Complete Right  Result Date: 01/24/2020 CLINICAL DATA:  Right foot swelling EXAM: RIGHT FOOT COMPLETE - 3+ VIEW COMPARISON:  None. FINDINGS: There is no evidence of fracture or dislocation. There is no evidence of arthropathy or other focal bone abnormality. Soft tissues are unremarkable. IMPRESSION: Negative. Electronically Signed   By: Ulyses Jarred M.D.   On: 01/24/2020 00:19    Microbiology Recent Results (from the past 240 hour(s))  Blood culture (routine x 2)     Status: None   Collection Time: 02/17/2020  1:13 PM   Specimen: BLOOD  Result Value Ref Range Status   Specimen Description BLOOD SITE NOT SPECIFIED  Final   Special Requests AEROBIC BOTTLE ONLY Blood Culture adequate volume  Final   Culture   Final    NO GROWTH 5 DAYS Performed at [facility], [address] [place], [place] [address]    Report Status 02/10/2020 FINAL  Final  Blood culture (routine x 2)     Status: None   Collection Time: 02/28/2020  1:15 PM   Specimen: BLOOD  Result Value Ref Range Status   Specimen Description BLOOD SITE NOT SPECIFIED  Final   Special  Requests AEROBIC BOTTLE ONLY Blood Culture adequate volume  Final   Culture   Final    NO GROWTH 5 DAYS Performed at [facility], [place]  [address], [place], [place] [address]    Report Status 02/10/2020 FINAL  Final  Resp Panel by RT-PCR (Flu A&B, Covid) Nasopharyngeal Swab     Status: None   Collection Time: 02/08/2020  1:54 PM   Specimen: Nasopharyngeal Swab; Nasopharyngeal(NP) swabs in vial transport medium  Result Value Ref Range Status   SARS Coronavirus 2 by RT PCR NEGATIVE NEGATIVE Final    Comment: (NOTE) SARS-CoV-2 target nucleic acids are NOT DETECTED.  The SARS-CoV-2 RNA is generally detectable in upper respiratory specimens during the acute phase of infection. The lowest concentration of SARS-CoV-2 viral copies this assay can detect is 138 copies/mL. A negative result does not preclude SARS-Cov-2 infection and should not be used as the sole basis for treatment or other patient management decisions. A negative result may occur with  improper specimen collection/handling, submission of specimen other than nasopharyngeal swab, presence of viral mutation(s) within the areas targeted by this assay, and inadequate number of viral copies(<138 copies/mL). A negative result must be combined with clinical observations, patient history, and epidemiological information. The expected result is Negative.  Fact Sheet for Patients:  EntrepreneurPulse.com.au  Fact Sheet for Healthcare Providers:  IncredibleEmployment.be  This test is no t yet approved or cleared by the [place] FDA and  has been authorized for detection and/or diagnosis of SARS-CoV-2 by FDA under an Emergency Use Authorization (EUA). This EUA will remain  in effect (meaning this test can be used) for the duration of the COVID-19 declaration under Section 564(b)(1) of the Act, 21 U.S.C.section 360bbb-3(b)(1), unless the authorization is terminated  or revoked sooner.        Influenza A by PCR NEGATIVE NEGATIVE Final   Influenza B by PCR NEGATIVE NEGATIVE Final    Comment: (NOTE) The Xpert Xpress SARS-CoV-2/FLU/RSV plus assay is intended as an aid in the diagnosis of influenza from Nasopharyngeal swab specimens and should not be used as a sole basis for treatment. Nasal washings and aspirates are unacceptable for Xpert Xpress SARS-CoV-2/FLU/RSV testing.  Fact Sheet for Patients: EntrepreneurPulse.com.au  Fact Sheet for Healthcare Providers: IncredibleEmployment.be  This test is not yet approved or cleared by the [place] FDA and has been authorized for detection and/or diagnosis of SARS-CoV-2 by FDA under an Emergency Use Authorization (EUA). This EUA will remain in effect (meaning this test can be used) for the duration of the COVID-19 declaration under Section 564(b)(1) of the Act, 21 U.S.C. section 360bbb-3(b)(1), unless the authorization is terminated or revoked.  Performed at [facility], [place] [address], [place], [place] [address]   MRSA PCR Screening     Status: None   Collection Time: 02/12/20 10:03 PM   Specimen: Nasopharyngeal  Result Value Ref Range Status   MRSA by PCR NEGATIVE NEGATIVE Final    Comment:        The GeneXpert MRSA Assay (FDA approved for NASAL specimens only), is one component of a comprehensive MRSA colonization surveillance program. It is not intended to diagnose MRSA infection nor to guide or monitor treatment for MRSA infections. Performed at [facility], [place] [address] [place], [place] [address]     Lab Basic Metabolic Panel: Recent Labs  Lab 02/08/20 0102 02/09/20 0234 02/10/20 0453 02/12/20 0730 02/25/2020 0434  NA 133* 136 134* 134* 134*  K 3.7 3.8 3.9 4.1 3.9  CL 93* 94* 94* 95* 95*  CO2 24 26 26 23 25   GLUCOSE 135* 113* 132* 117* 116*  BUN 28* 21 20 49* 27*  CREATININE 5.95* 5.02* 4.52* 6.70* 4.55*  CALCIUM 8.1* 7.9* 8.0* 8.4* 8.1*    Liver Function Tests: Recent Labs  Lab 02/08/20 0102 02/09/20 0234 02/10/20 0453 02/12/20 0730 February 22, 2020 0434  AST 672* 381* 239* 99* 97*  ALT 909* 685* 510* 231* 182*  ALKPHOS 129* 134* 128* 124 123  BILITOT 1.1 1.3* 1.3* 1.2 1.2  PROT 5.1* 5.4* 5.4* 5.0* 4.9*  ALBUMIN 2.4* 2.5* 2.5* 2.1* 2.0*   No results for input(s): LIPASE, AMYLASE in the last 168 hours. Recent Labs  Lab 02/07/20 0903  AMMONIA 33   CBC: Recent Labs  Lab 02/08/20 0102 02/09/20 0234 02/10/20 0453 02/12/20 0730 2020/02/22 0434  WBC 14.5* 13.6* 12.8* 26.3* 26.5*  HGB 12.7* 12.5* 12.3* 12.4* 12.3*  HCT 40.5 40.4 37.8* 37.7* 36.8*  MCV 82.3 82.8 81.1 80.4 79.8*  PLT 112* 84* 90* 97* 98*   Cardiac Enzymes: No results for input(s): CKTOTAL, CKMB, CKMBINDEX, TROPONINI in the last 168 hours. Sepsis Labs: Recent Labs  Lab 02/06/20 1757 02/07/20 0507 02/07/20 0507 02/08/20 0102 02/09/20 0234 02/10/20 0453 02/12/20 0730 February 22, 2020 0434  PROCALCITON 1.46 1.96  --  2.30  --   --   --   --   WBC  --  16.5*   < > 14.5* 13.6* 12.8* 26.3* 26.5*   < > = values in this interval not displayed.    Procedures/Operations  Abdominal US   Cristal Ford February 22, 2020, 12:09 PM

## 2020-03-02 NOTE — Plan of Care (Signed)

## 2020-03-02 DEATH — deceased

## 2021-03-16 IMAGING — CT CT HEAD W/O CM
5 of 12 series · 17 of 47 positions shown, 18 images · non-contrast
Comparison: 10/17/2018

CLINICAL DATA: Decreased level of consciousness

EXAM:
CT HEAD WITHOUT CONTRAST
TECHNIQUE: Contiguous axial images were obtained from the base of the skull
through the vertex without intravenous contrast.

[Series 3: head bone · axial · 0.46mm/px · z∈[-57,+69]mm · 8 of 79 slices shown (1 of 2)]
[im 8/79  bone]
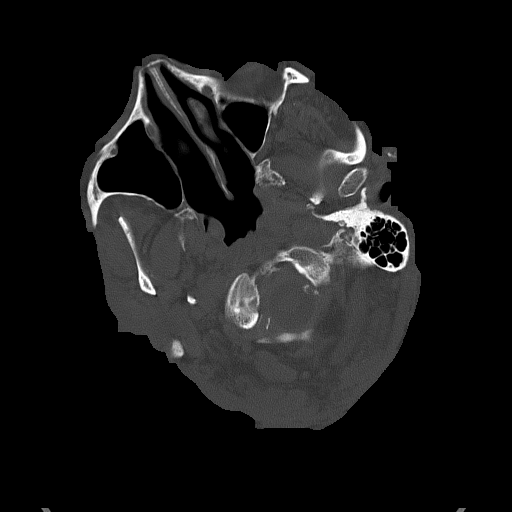
[im 15/79  bone]
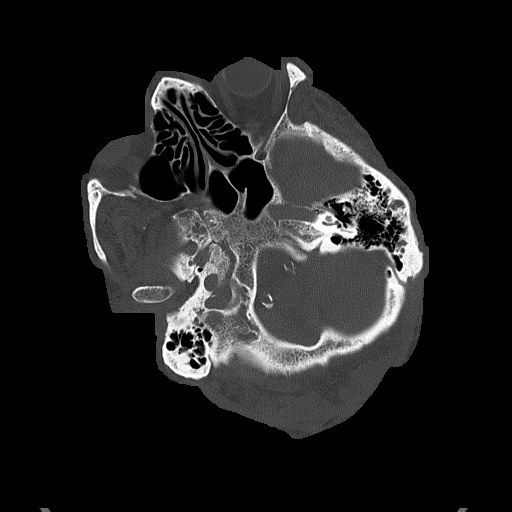
[im 29/79  bone]
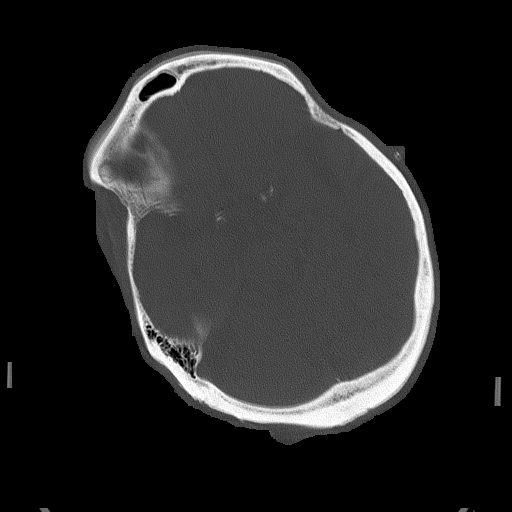
[im 36/79  bone]
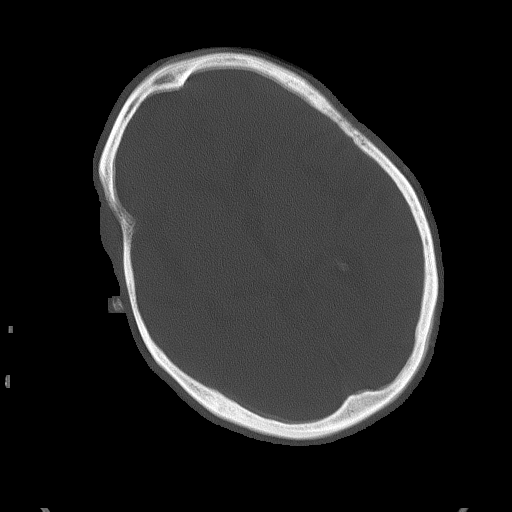
[im 43/79  bone]
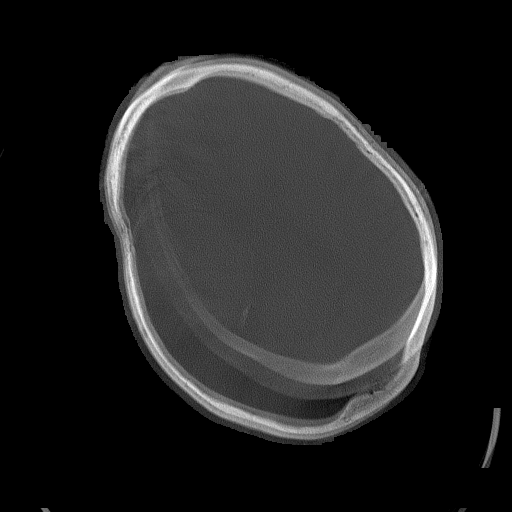
[im 50/79  bone]
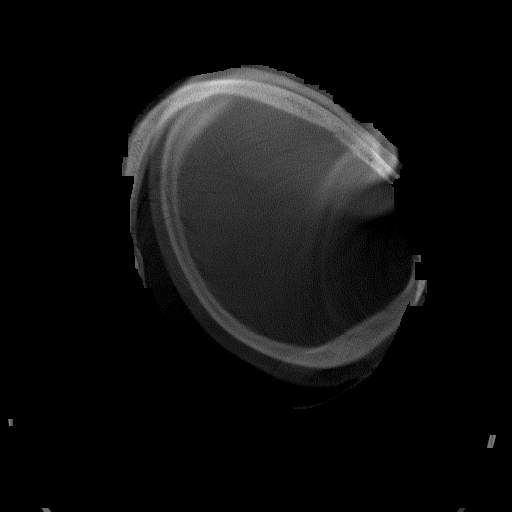
[im 64/79  bone]
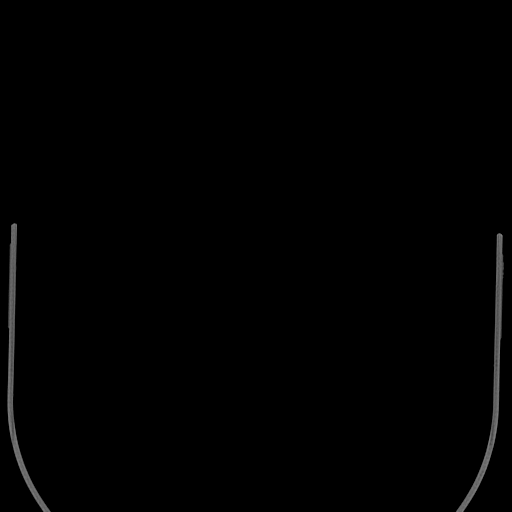
[im 71/79  bone]
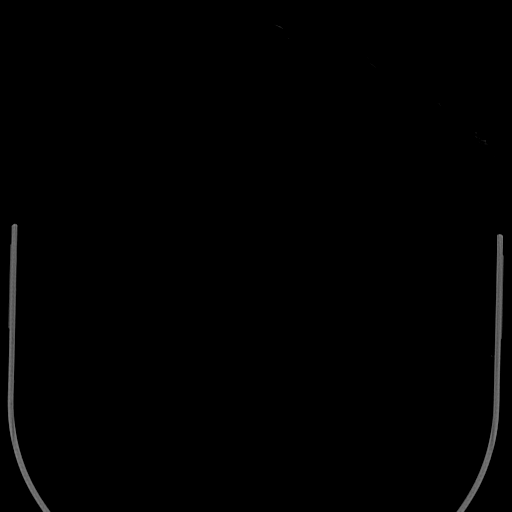

[Series 4: head without · axial · non-contrast · 0.46mm/px · z∈[-26,+19]mm · 2 of 28 slices shown, 3 images]
[im 10/28  brain]
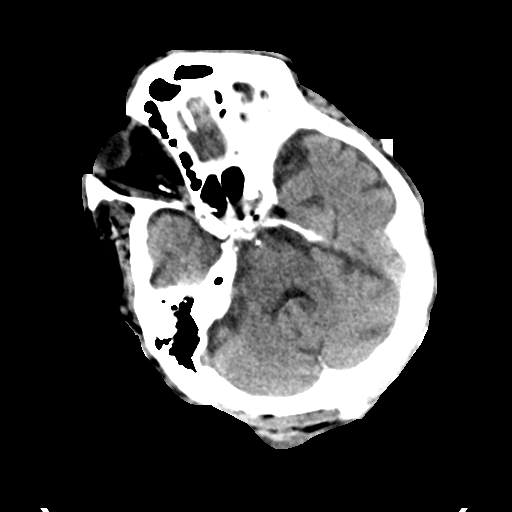
[im 10/28  bone]
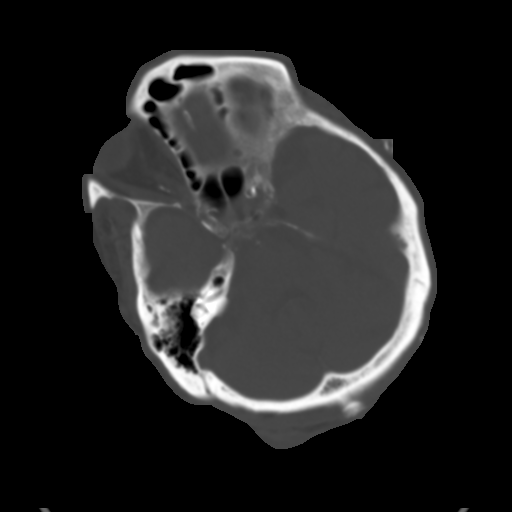
[im 19/28  brain]
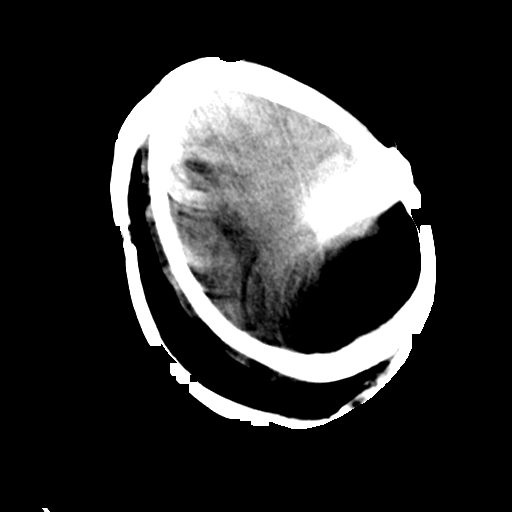

[Series 5: head without cor · coronal · non-contrast · 0.23mm/px · 2 of 76 slices shown]
[im 26/76  brain]
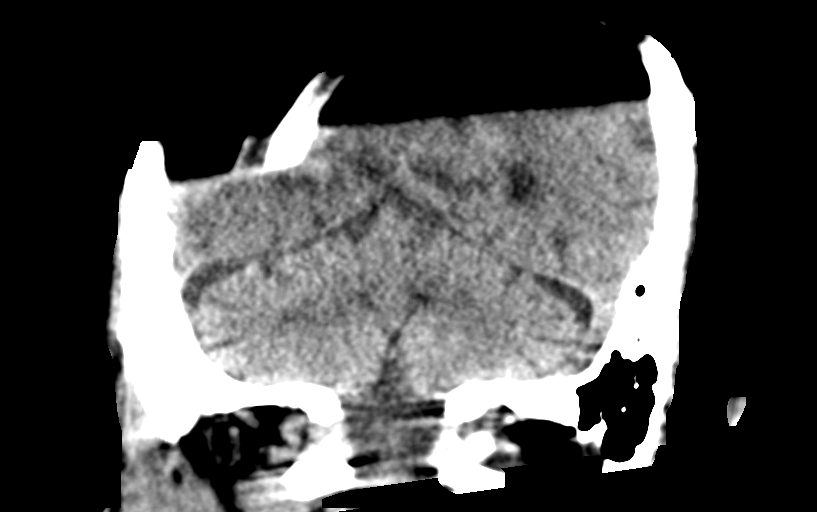
[im 51/76  brain]
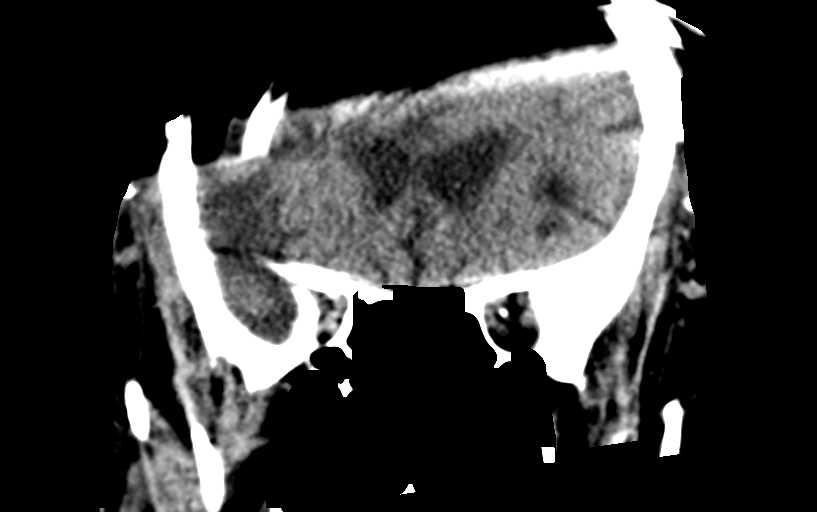

[Series 8: head bone · axial · 0.46mm/px · z∈[+6,+38]mm · 3 of 47 slices shown (2 of 2)]
[im 8/47  bone]
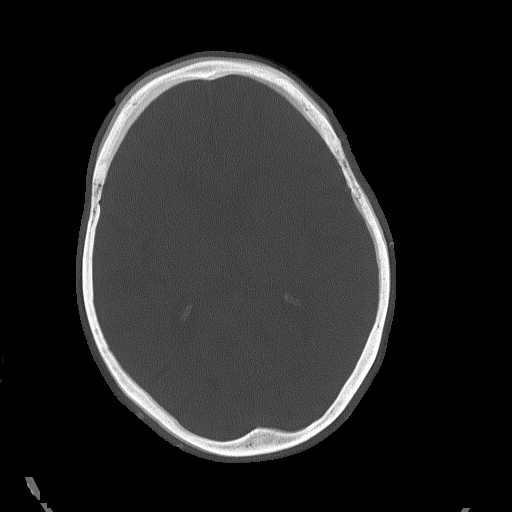
[im 16/47  bone]
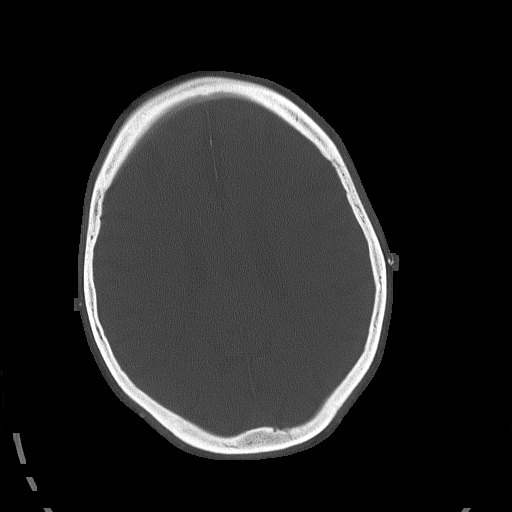
[im 24/47  bone]
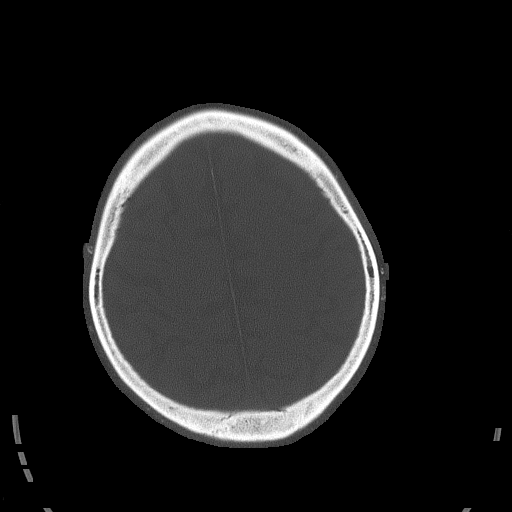

[Series 10: head without sag · sagittal · non-contrast · 0.28mm/px · 2 of 67 slices shown]
[im 32/67  brain]
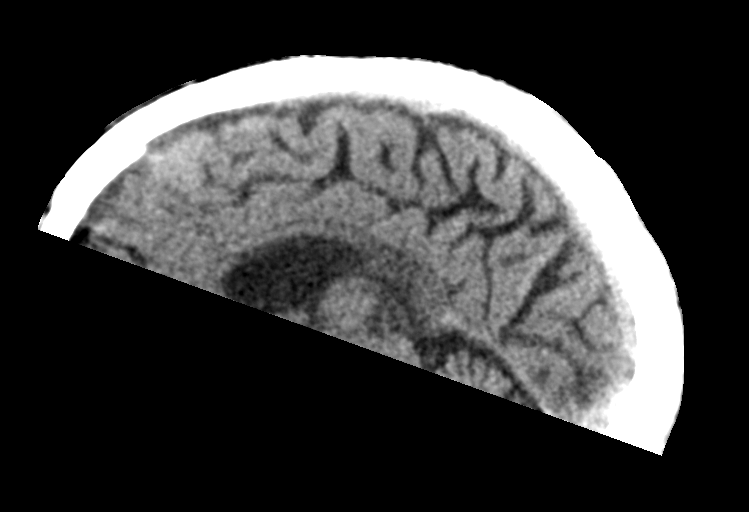
[im 63/67  brain]
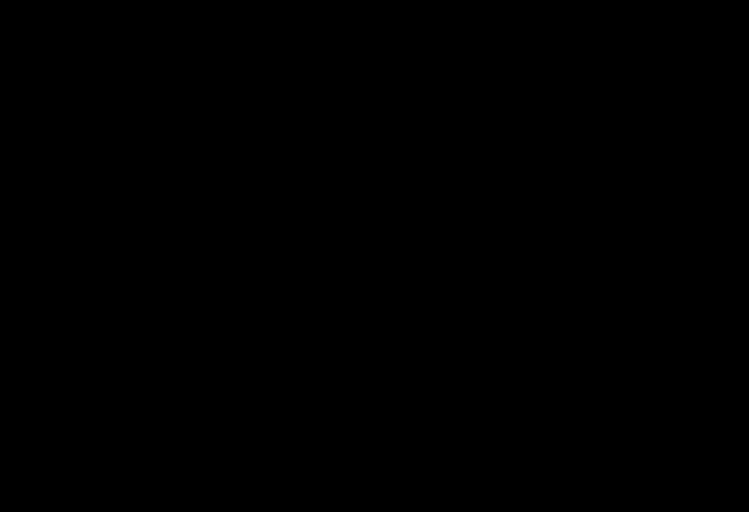

[17 of 47 positions shown; findings below may reference images not displayed]

FINDINGS: Brain: There is atrophy and chronic small vessel disease changes. No
acute intracranial abnormality. Specifically, no hemorrhage,
hydrocephalus, mass lesion, acute infarction, or significant
intracranial injury.

Vascular: No hyperdense vessel or unexpected calcification.

Skull: No acute calvarial abnormality.

Sinuses/Orbits: No acute findings

Other: None
IMPRESSION: Atrophy, chronic microvascular disease.

No acute intracranial abnormality.
